# Patient Record
Sex: Male | Born: 1945
Health system: Southern US, Community
[De-identification: ages and names within clinical notes are randomized; demographics above are authoritative.]

## PROBLEM LIST (undated history)

## (undated) DIAGNOSIS — J90 Pleural effusion, not elsewhere classified: Secondary | ICD-10-CM

## (undated) DIAGNOSIS — K219 Gastro-esophageal reflux disease without esophagitis: Secondary | ICD-10-CM

## (undated) DIAGNOSIS — J218 Acute bronchiolitis due to other specified organisms: Secondary | ICD-10-CM

## (undated) DIAGNOSIS — Z8719 Personal history of other diseases of the digestive system: Secondary | ICD-10-CM

## (undated) DIAGNOSIS — E785 Hyperlipidemia, unspecified: Secondary | ICD-10-CM

## (undated) DIAGNOSIS — M1990 Unspecified osteoarthritis, unspecified site: Secondary | ICD-10-CM

## (undated) DIAGNOSIS — I10 Essential (primary) hypertension: Secondary | ICD-10-CM

## (undated) DIAGNOSIS — A1889 Tuberculosis of other sites: Secondary | ICD-10-CM

## (undated) DIAGNOSIS — Z79899 Other long term (current) drug therapy: Secondary | ICD-10-CM

## (undated) HISTORY — DX: Pleural effusion, not elsewhere classified: J90

## (undated) HISTORY — DX: Tuberculosis of other sites: A18.89

## (undated) HISTORY — DX: Hyperlipidemia, unspecified: E78.5

## (undated) HISTORY — PX: JOINT REPLACEMENT: SHX530

## (undated) HISTORY — DX: Acute bronchiolitis due to other specified organisms: J21.8

## (undated) HISTORY — PX: HIATAL HERNIA REPAIR: SHX195

## (undated) HISTORY — PX: CATARACT EXTRACTION W/ INTRAOCULAR LENS  IMPLANT, BILATERAL: SHX1307

## (undated) HISTORY — DX: Other long term (current) drug therapy: Z79.899

## (undated) HISTORY — PX: HERNIA REPAIR: SHX51

## (undated) HISTORY — DX: Gastro-esophageal reflux disease without esophagitis: K21.9

## (undated) SURGERY — Surgical Case
Anesthesia: *Unknown

---

## 2002-02-03 ENCOUNTER — Emergency Department (HOSPITAL_COMMUNITY): Admission: EM | Admit: 2002-02-03 | Discharge: 2002-02-03 | Payer: Self-pay

## 2003-04-09 ENCOUNTER — Encounter: Admission: RE | Admit: 2003-04-09 | Discharge: 2003-04-09 | Payer: Self-pay | Admitting: Orthopedic Surgery

## 2003-04-09 ENCOUNTER — Encounter: Payer: Self-pay | Admitting: Orthopedic Surgery

## 2003-09-30 ENCOUNTER — Encounter: Admission: RE | Admit: 2003-09-30 | Discharge: 2003-09-30 | Payer: Self-pay | Admitting: Emergency Medicine

## 2004-04-28 ENCOUNTER — Ambulatory Visit (HOSPITAL_COMMUNITY): Admission: RE | Admit: 2004-04-28 | Discharge: 2004-04-28 | Payer: Self-pay | Admitting: Gastroenterology

## 2004-05-05 ENCOUNTER — Ambulatory Visit (HOSPITAL_COMMUNITY): Admission: RE | Admit: 2004-05-05 | Discharge: 2004-05-05 | Payer: Self-pay | Admitting: Gastroenterology

## 2004-05-28 ENCOUNTER — Ambulatory Visit (HOSPITAL_COMMUNITY): Admission: RE | Admit: 2004-05-28 | Discharge: 2004-05-28 | Payer: Self-pay | Admitting: Gastroenterology

## 2004-05-28 ENCOUNTER — Encounter (INDEPENDENT_AMBULATORY_CARE_PROVIDER_SITE_OTHER): Payer: Self-pay | Admitting: *Deleted

## 2004-06-10 ENCOUNTER — Encounter: Admission: RE | Admit: 2004-06-10 | Discharge: 2004-06-10 | Payer: Self-pay | Admitting: Gastroenterology

## 2004-08-24 ENCOUNTER — Encounter (INDEPENDENT_AMBULATORY_CARE_PROVIDER_SITE_OTHER): Payer: Self-pay | Admitting: *Deleted

## 2004-08-24 ENCOUNTER — Ambulatory Visit (HOSPITAL_COMMUNITY): Admission: RE | Admit: 2004-08-24 | Discharge: 2004-08-25 | Payer: Self-pay | Admitting: General Surgery

## 2007-03-08 ENCOUNTER — Encounter: Admission: RE | Admit: 2007-03-08 | Discharge: 2007-03-08 | Payer: Self-pay | Admitting: Surgery

## 2007-11-06 ENCOUNTER — Ambulatory Visit (HOSPITAL_COMMUNITY): Admission: RE | Admit: 2007-11-06 | Discharge: 2007-11-07 | Payer: Self-pay | Admitting: General Surgery

## 2010-12-14 NOTE — H&P (Signed)
NAMEDARIL, WARGA              ACCOUNT NO.:  [id number]   MEDICAL RECORD NO.:  [id number]          PATIENT TYPE:  AMB   LOCATION:  DAY                          FACILITY:  [facility]   PHYSICIAN:  Adolph Pollack, M.D.DATE OF BIRTH:  06-02-1946   DATE OF ADMISSION:  11/06/2007  DATE OF DISCHARGE:                              HISTORY & PHYSICAL   REASON FOR ADMISSION:  Elective repair of ventral and umbilical hernias.   HISTORY OF PRESENT ILLNESS:  Mr. Portell is a 65 year old male who had a  primary epigastric hernia repair performed in 2006.  He presented in the  late summer, early fall last year with a bulge inferior to his previous  repair and a periumbilical bulge.  CT scan confirmed an epigastric  ventral hernia with omentum up in it.  He now presents for repair of  both of these hernias.  We discussed the procedure, the risks, and the  aftercare preoperatively.   PAST MEDICAL HISTORY:  1. Hypertension.  2. Ventral hernia.  3. Psoriasis.   PREVIOUS OPERATIONS:  1. Ventral hernia (mesh).  2. Multiple knee surgeries.   ALLERGIES:  None known.   MEDICATIONS:  Norvasc and Enbrel injections.   SOCIAL HISTORY:  Married.  No tobacco or alcohol use.   PHYSICAL EXAMINATION:  GENERAL:  A well-developed, well-nourished male  in no acute distress.  He is pleasant and cooperative.  VITAL SIGNS:  Temperature is 97 degrees, blood pressure 137/92, and  pulse 80.  HEENT:  Eyes:  Extraocular motion is intact.  No icterus.  CHEST:  Breath sounds equal and clear, respirations unlabored.  CARDIOVASCULAR:  Heart demonstrates regular rate, regular rhythm.  No  murmur.  NECK:  No JVD.  ABDOMEN:  Demonstrates an epigastric incision, inferior to this there is  a bulge that is partially reducible.  There is a periumbilical bulge  reducible.  MUSCULOSKELETAL:  SCD hose on.  SKIN:  He has diffuse plaque-like rash present consistent with  psoriasis.   IMPRESSION:  1. Ventral and umbilical  hernias.  2. Psoriasis.   PLAN:  Laparoscopic repair of ventral and umbilical hernias with mesh.      Adolph Pollack, M.D.  Electronically Signed     TJR/MEDQ  D:  11/06/2007  T:  11/06/2007  Job:  [id number]

## 2010-12-14 NOTE — Op Note (Signed)
Alan Roman, Alan Roman              ACCOUNT NO.:  [id number]   MEDICAL RECORD NO.:  [id number]          PATIENT TYPE:  AMB   LOCATION:  DAY                          FACILITY:  [facility]   PHYSICIAN:  Adolph Pollack, M.D.DATE OF BIRTH:  November 05, 1945   DATE OF PROCEDURE:  11/06/2007  DATE OF DISCHARGE:                               OPERATIVE REPORT   PREOPERATIVE DIAGNOSES:  Ventral and umbilical hernias.   POSTOPERATIVE DIAGNOSES:  Ventral and umbilical hernias.   PROCEDURE:  Laparoscopic repair of ventral and umbilical hernias with  mesh.   SURGEON:  Adolph Pollack, M.D.   ASSISTANT:  Angelia Mould. Derrell Lolling, M.D.   ANESTHESIA:  General.   INDICATIONS:  This 65 year old male had a primary epigastric hernia  repaired with mesh in 2006.  Inferior to this repair, he was developed  another hernia and also has an umbilical hernia and presents now for  repair.   TECHNIQUE:  He was seen in the holding area and brought to the operating  room, placed supine on the operating table and general anesthetic was  administered.  A Foley catheter was inserted into the bladder.  The hair  on the abdominal wall was clipped and the area was sterilely prepped and  draped.  Marcaine solution was infiltrated in left upper quadrant  region.  A small incision was made in the left upper quadrant.  Using a  5-mm OptiVu trocar, we gained access to the peritoneal cavity and  pneumoperitoneum was created.  I inspected the area directly below the  trocar laparoscopically and noted no injury to the viscera and no  bleeding.  I could see the omentum up in the hernia in the epigastric  region, as well as evidence of previous hernia repair.  A small  umbilical hernia with preperitoneal fat was noted as well.   An 11-mm trocar was placed in the left lateral abdomen, and after this,  I reduced the omentum from the hernia and then divided the falciform  ligament with the harmonic scalpel.  There was some bleeding  from the  omentum reduced from the hernia, and this was controlled with the  harmonic scalpel.  Two 5-mm trocars were then placed, one in the right  upper quadrant and one in the right midabdomen laterally.   Using a spinal needle, I marked the edges of the ventral abdominal wall  and umbilical hernias and measured 4 cm away from these.  A piece of  mesh needed measuring about 20 cm in length x 12 cm in width.  A piece  of 20 x 15 cm mesh consisting of Protex with a nonadherent barrier on  one side was brought into the field.  Four sutures of #1 Novofil were  placed in four quadrants for anchoring purposes.  The mesh was then  hydrated and placed into the abdominal cavity.   The mesh was then unrolled with the rough side up and the nonadherent  barrier side facing the viscera.  I made four stab incisions around the  four quadrants planned for repair and brought up the sutures across the  fascial  bridge using a suture passer.  These were then tied down,  initially anchoring the mesh to the abdominal wall.  This provided more  than adequate coverage with good overlap of the ventral and umbilical  defects.  I then used a spiral tacker to further anchor the mesh to the  anterior abdominal wall with an outer and an inner rim of tacks.  Upon  inspection, the mesh was well anchored with good coverage and overlap.   I then evacuated some blood and found a few other bleeding points of the  omentum which were controlled with the harmonic scalpel.  I inspected  all four quadrants and saw no evidence of intestinal injury or bleeding.  I then released the pneumoperitoneum and watched the mesh approximate  the omentum and viscera.  All trocars were removed.   All skin incisions were then closed with 4-0 Monocryl subcuticular  stitches followed by Steri-Strips and sterile dressings.  He tolerated  the procedure well without apparent complications and was taken to the  recovery room in satisfactory  condition.      Adolph Pollack, M.D.  Electronically Signed     TJR/MEDQ  D:  11/06/2007  T:  11/06/2007  Job:  [id number]   cc:   Reuben Likes, M.D.  Fax: [phone]

## 2010-12-17 NOTE — Op Note (Signed)
NAMEMARIO, CORONADO              ACCOUNT NO.:  [id number]   MEDICAL RECORD NO.:  [id number]          PATIENT TYPE:  OIB   LOCATION:  NA                           FACILITY:  [facility]   PHYSICIAN:  Adolph Pollack, M.D.DATE OF BIRTH:  12/06/1945   DATE OF PROCEDURE:  08/24/2004  DATE OF DISCHARGE:                                 OPERATIVE REPORT   PREOPERATIVE DIAGNOSIS:  Chronically incarcerated ventral hernia.   POSTOPERATIVE DIAGNOSIS:  Chronically incarcerated ventral hernia.   PROCEDURE:  Ventral hernia repair with mesh.   SURGEON:  Adolph Pollack, M.D.   ASSISTANT:  None.   ANESTHESIA:  General anesthesia.   INDICATIONS FOR PROCEDURE:  Mr. Sherrie George is a 65 year old male who has been  having some epigastric discomfort.  He has had had a CT scan done that  demonstrated a small ventral hernia and palpation of these areas  uncomfortable and it is unable to be completely reduced.  He now presents  for repair of his incarcerated ventral hernia.   DESCRIPTION OF PROCEDURE:  He is seen in the holding area and brought to the  operating room.  Placed supine on the operating table and general anesthetic  was administered.  The abdomen was sterilely prepped and draped.  A  longitudinal epigastric incision was made through the skin and subcutaneous  tissue.  Preperitoneal fat was noted protruding out of a defect and this was  excised.  The defect was noted.  There was also some weak fascia superior to  this so the actual complete defect itself measured about 7 to 8 cm.  I  raised subcutaneous flaps in all directed exposing normal-appearing fascia.  I then primarily closed the defect with interrupted #1 Novafil sutures.  Following this, a piece of polypropylene mesh was placed directly over the  repair and the previous primary closure sutures were threaded up through it  and then the mesh was anchored down directly over the primary repair with  the sutures.  The periphery of the  mesh was then anchored to the fascia with  an overlap of 4 cm with interrupted 0 Novofil sutures.  This provided for  more than adequate coverage of the defect with adequate overlap.   The fascia was then anesthetized with 0.5% plain Marcaine as was the  subcutaneous tissue. After hemostasis was noted to be adequate, the wound  was irrigated.  The subcutaneous tissue was then closed with a running #2  Vicryl suture.  The skin was closed with staples.  A sterile dressing was  applied.   He tolerated the procedure well without any apparent complications and was  taken to the recovery room in satisfactory condition.      TJR/MEDQ  D:  08/24/2004  T:  08/24/2004  Job:  [id number]   cc:   Reuben Likes, M.D.  [address]  [place]  [place] [address]  Fax: [phone]   Anselmo Rod, M.D.  [address]  [address]  [place]  [place] [address]  Fax: [phone]

## 2010-12-17 NOTE — Op Note (Signed)
NAMEKREGG, CIHLAR              ACCOUNT NO.:  [id number]   MEDICAL RECORD NO.:  [id number]          PATIENT TYPE:  AMB   LOCATION:  ENDO                         FACILITY:  [facility]   PHYSICIAN:  Anselmo Rod, M.D.  DATE OF BIRTH:  16-Dec-1945   DATE OF PROCEDURE:  05/28/2004  DATE OF DISCHARGE:  05/28/2004                                 OPERATIVE REPORT   PROCEDURE PERFORMED:  Esophagogastroduodenoscopy with distal esophageal  biopsies.   ENDOSCOPIST:  Anselmo Rod, M.D.   INSTRUMENT USED:  Olympus video panendoscope.   INDICATION FOR PROCEDURE:  A 65 year old Asian male with history of  epigastric pain and reflux and underwent EGD to rule out peptic ulcer  disease, esophagitis, gastritis, etc.   PREPROCEDURE PREPARATION:  Informed consent was procured from the patient  and the patient fasted for 8 hours prior to the procedure.   PREPROCEDURE PHYSICAL:  The patient had stable vital signs, neck supple,  chest clear to auscultation, S1/S2 regular, abdomen soft with normal bowel  sounds.   DESCRIPTION OF PROCEDURE:  The patient was placed in the left lateral  decubitus position and sedated with 75 mg of Demerol and 7.5 mg of Versed in  incremental doses.  Once the patient was adequately sedated and maintained  on low-flow oxygen and continuous cardiac monitoring, the Olympus video  panendoscope was advanced through the mouth, placed over the tongue and into  the esophagus and under direct vision.  The proximal esophagus appeared  normal.  A small nodule was biopsied from the distal esophagus to rule out  Barrett's.  A small hiatal hernia was seen on high retroflexion.  The rest  of the gastric mucosa and proximal small bowel appeared normal.   IMPRESSION:  1.  Small distal esophageal nodule, biopsied, question esophagitis versus      Barrett's esophagus.  2.  Small hiatal hernia.  3.  No ulcers or masses seen.  4.  Normal proximal small bowel and gastric mucosa.   RECOMMENDATIONS:  1.  Avoid nonsteroidals and aspirin for now.  2.  Follow antireflux measures.  3.  PPI of choice.  4.  Proceed with a colonoscopy at this time and further recommendations will      be made after the colonoscopy has been done.       JNM/MEDQ  D:  05/31/2004  T:  05/31/2004  Job:  [id number]   cc:   Adolph Pollack, M.D.  [address]  [place]  [place] [address]  Fax: [phone]   Reuben Likes, M.D.  [address]  [place]  [place] [address]  Fax: [phone]

## 2010-12-17 NOTE — Op Note (Signed)
NAMEBRAYSEN, Alan Roman              ACCOUNT NO.:  [id number]   MEDICAL RECORD NO.:  [id number]          PATIENT TYPE:  AMB   LOCATION:  ENDO                         FACILITY:  [facility]   PHYSICIAN:  Anselmo Rod, M.D.  DATE OF BIRTH:  19-Jan-1946   DATE OF PROCEDURE:  05/28/2004  DATE OF DISCHARGE:  05/28/2004                                 OPERATIVE REPORT   PROCEDURE:  Screening colonoscopy.   ENDOSCOPIST:  Anselmo Rod, M.D.   INSTRUMENT:  Olympus video colonoscope.   INDICATIONS FOR PROCEDURE:  A 65 year old Asian male underwent screening  colonoscopy to rule out colonic polyps, masses, etc.   PRE-PROCEDURE PREPARATION:  Informed consent was procured from the patient.  Patient fasted for 8 hours prior to the procedure and prepped with a bottle  of magnesium citrate and a gallon of NuLytely the night prior to the  procedure.  Pre-procedure physical:  Patient had stable vital signs, neck  supple, chest clear to auscultation, S1/S2 regular, abdomen soft with normal  bowel sounds.   DESCRIPTION OF PROCEDURE:  The patient was placed in the left lateral  decubitus position, sedated with an additional 10 mg of Demerol and 1 mg of  Versed in slow, incremental doses.  Once the patient was adequately sedated  and maintained on low flow oxygen, continuous cardiac monitoring; the  Olympus video colonoscope was advanced from the rectum to the cecum.  The  appendiceal orifice and the ileocecal valve were clearly visualized and  photographed.  No masses, polyps, erosions, diverticulosis or ulcerations  were seen. Small internal hemorrhoids were appreciated on retroflexion in  the rectum.  The patient tolerated the procedure well without immediate  complications.   IMPRESSION:  1.  Normal colonoscopy to the cecum, except for small internal hemorrhoids.  2.  No masses or polyps seen.   RECOMMENDATIONS:  1.  Continue a high fiber diet with liberal fluids intake.  2.  Repeat colonoscopy  in the next 5-10 years unless the patient develops      any abnormal symptoms in the interim.  3.  Outpatient follow up as the need arises in the future.       JNM/MEDQ  D:  05/31/2004  T:  05/31/2004  Job:  [id number]   cc:   Reuben Likes, M.D.  [address]  [place]  [place] [address]  Fax: [phone]   Adolph Pollack, M.D.  [address]  [place]  [place] [address]  Fax: [phone]

## 2011-04-26 LAB — COMPREHENSIVE METABOLIC PANEL
ALT: 36
AST: 38 — ABNORMAL HIGH
Albumin: 3.8
Alkaline Phosphatase: 69
BUN: 10
CO2: 27
Calcium: 8.7
Chloride: 104
Creatinine, Ser: 1.05
GFR calc Af Amer: >60
GFR calc non Af Amer: >60
Glucose, Bld: 89
Potassium: 3.9
Sodium: 137
Total Bilirubin: 1.3 — ABNORMAL HIGH
Total Protein: 7.1

## 2011-04-26 LAB — CBC
HCT: 44.2
Hemoglobin: 15.7
MCHC: 35.5
MCV: 94.1
Platelets: 147 — ABNORMAL LOW
RBC: 4.7
RDW: 12.4
WBC: 5.5

## 2011-04-26 LAB — DIFFERENTIAL
Basophils Absolute: 0
Basophils Relative: 0
Eosinophils Absolute: 0.1
Eosinophils Relative: 1
Lymphocytes Relative: 29
Lymphs Abs: 1.6
Monocytes Absolute: 0.6
Monocytes Relative: 11
Neutro Abs: 3.2
Neutrophils Relative %: 58

## 2012-05-29 ENCOUNTER — Ambulatory Visit
Admission: RE | Admit: 2012-05-29 | Discharge: 2012-05-29 | Disposition: A | Discharge status: Home / Self Care | Payer: Medicare Other | Source: Ambulatory Visit | Attending: Family Medicine | Admitting: Family Medicine

## 2012-05-29 ENCOUNTER — Other Ambulatory Visit: Payer: Self-pay | Admitting: Family Medicine

## 2012-05-29 DIAGNOSIS — R05 Cough: Secondary | ICD-10-CM

## 2012-05-29 DIAGNOSIS — R509 Fever, unspecified: Secondary | ICD-10-CM

## 2012-05-29 DIAGNOSIS — R059 Cough, unspecified: Secondary | ICD-10-CM

## 2013-01-23 ENCOUNTER — Encounter (HOSPITAL_COMMUNITY): Payer: Self-pay | Admitting: *Deleted

## 2013-01-23 ENCOUNTER — Inpatient Hospital Stay (HOSPITAL_COMMUNITY): Payer: Medicare Other

## 2013-01-23 ENCOUNTER — Inpatient Hospital Stay (HOSPITAL_COMMUNITY)
Admission: RE | Admit: 2013-01-23 | Payer: Self-pay | Source: Other Acute Inpatient Hospital | Admitting: Internal Medicine

## 2013-01-23 ENCOUNTER — Inpatient Hospital Stay (HOSPITAL_COMMUNITY)
Admission: AD | Admit: 2013-01-23 | Discharge: 2013-02-02 | DRG: 178 | Disposition: A | Discharge status: Home / Self Care | Payer: Medicare Other | Source: Other Acute Inpatient Hospital | Attending: Internal Medicine | Admitting: Internal Medicine

## 2013-01-23 DIAGNOSIS — R634 Abnormal weight loss: Secondary | ICD-10-CM | POA: Diagnosis present

## 2013-01-23 DIAGNOSIS — R7401 Elevation of levels of liver transaminase levels: Secondary | ICD-10-CM | POA: Diagnosis present

## 2013-01-23 DIAGNOSIS — D899 Disorder involving the immune mechanism, unspecified: Secondary | ICD-10-CM | POA: Diagnosis present

## 2013-01-23 DIAGNOSIS — R7402 Elevation of levels of lactic acid dehydrogenase (LDH): Secondary | ICD-10-CM | POA: Diagnosis present

## 2013-01-23 DIAGNOSIS — T3695XA Adverse effect of unspecified systemic antibiotic, initial encounter: Secondary | ICD-10-CM | POA: Diagnosis present

## 2013-01-23 DIAGNOSIS — A1889 Tuberculosis of other sites: Secondary | ICD-10-CM | POA: Diagnosis present

## 2013-01-23 DIAGNOSIS — R059 Cough, unspecified: Secondary | ICD-10-CM

## 2013-01-23 DIAGNOSIS — J218 Acute bronchiolitis due to other specified organisms: Secondary | ICD-10-CM

## 2013-01-23 DIAGNOSIS — R630 Anorexia: Secondary | ICD-10-CM | POA: Diagnosis present

## 2013-01-23 DIAGNOSIS — M129 Arthropathy, unspecified: Secondary | ICD-10-CM | POA: Diagnosis present

## 2013-01-23 DIAGNOSIS — R509 Fever, unspecified: Secondary | ICD-10-CM

## 2013-01-23 DIAGNOSIS — A158 Other respiratory tuberculosis: Principal | ICD-10-CM | POA: Diagnosis present

## 2013-01-23 DIAGNOSIS — J90 Pleural effusion, not elsewhere classified: Secondary | ICD-10-CM

## 2013-01-23 DIAGNOSIS — R945 Abnormal results of liver function studies: Secondary | ICD-10-CM

## 2013-01-23 DIAGNOSIS — Z66 Do not resuscitate: Secondary | ICD-10-CM | POA: Diagnosis present

## 2013-01-23 DIAGNOSIS — E871 Hypo-osmolality and hyponatremia: Secondary | ICD-10-CM

## 2013-01-23 DIAGNOSIS — L409 Psoriasis, unspecified: Secondary | ICD-10-CM

## 2013-01-23 DIAGNOSIS — D849 Immunodeficiency, unspecified: Secondary | ICD-10-CM | POA: Diagnosis present

## 2013-01-23 DIAGNOSIS — R918 Other nonspecific abnormal finding of lung field: Secondary | ICD-10-CM | POA: Diagnosis present

## 2013-01-23 DIAGNOSIS — L408 Other psoriasis: Secondary | ICD-10-CM | POA: Diagnosis present

## 2013-01-23 DIAGNOSIS — I10 Essential (primary) hypertension: Secondary | ICD-10-CM | POA: Diagnosis present

## 2013-01-23 DIAGNOSIS — R7611 Nonspecific reaction to tuberculin skin test without active tuberculosis: Secondary | ICD-10-CM | POA: Diagnosis present

## 2013-01-23 DIAGNOSIS — R05 Cough: Secondary | ICD-10-CM

## 2013-01-23 DIAGNOSIS — J189 Pneumonia, unspecified organism: Secondary | ICD-10-CM

## 2013-01-23 HISTORY — DX: Unspecified osteoarthritis, unspecified site: M19.90

## 2013-01-23 HISTORY — DX: Essential (primary) hypertension: I10

## 2013-01-23 LAB — CREATININE, SERUM
Creatinine, Ser: 1 mg/dL (ref 0.50–1.35)
GFR calc Af Amer: 87 mL/min — ABNORMAL LOW (ref >90)
GFR calc non Af Amer: 75 mL/min — ABNORMAL LOW (ref >90)

## 2013-01-23 LAB — CBC
HCT: 39.1 % (ref 39.0–52.0)
Hemoglobin: 13.9 g/dL (ref 13.0–17.0)
MCH: 30.6 pg (ref 26.0–34.0)
MCHC: 35.5 g/dL (ref 30.0–36.0)
MCV: 86.1 fL (ref 78.0–100.0)
Platelets: 229 10*3/uL (ref 150–400)
RBC: 4.54 MIL/uL (ref 4.22–5.81)
RDW: 11.9 % (ref 11.5–15.5)
WBC: 4.7 10*3/uL (ref 4.0–10.5)

## 2013-01-23 MED ORDER — SODIUM CHLORIDE 0.9 % IJ SOLN
3.0000 mL | Freq: Two times a day (BID) | INTRAMUSCULAR | Status: DC
  Administered 2013-01-23 – 2013-02-02 (×13): 3 mL via INTRAVENOUS

## 2013-01-23 MED ORDER — ACETAMINOPHEN 325 MG PO TABS
650.0000 mg | ORAL_TABLET | ORAL | Status: DC | PRN
  Administered 2013-01-24 – 2013-01-31 (×14): 650 mg via ORAL
  Filled 2013-01-23 (×14): qty 2

## 2013-01-23 MED ORDER — ENOXAPARIN SODIUM 40 MG/0.4ML ~~LOC~~ SOLN
40.0000 mg | SUBCUTANEOUS | Status: DC
  Administered 2013-01-23 – 2013-02-01 (×10): 40 mg via SUBCUTANEOUS
  Filled 2013-01-23 (×11): qty 0.4

## 2013-01-23 MED ORDER — DEXTROSE 5 % IV SOLN
1.0000 g | INTRAVENOUS | Status: AC
  Administered 2013-01-24 – 2013-01-27 (×4): 1 g via INTRAVENOUS
  Filled 2013-01-23 (×5): qty 10

## 2013-01-23 MED ORDER — DEXTROSE 5 % IV SOLN
500.0000 mg | INTRAVENOUS | Status: AC
  Administered 2013-01-24 – 2013-01-27 (×4): 500 mg via INTRAVENOUS
  Filled 2013-01-23 (×7): qty 500

## 2013-01-23 MED ORDER — HYDROCOD POLST-CHLORPHEN POLST 10-8 MG/5ML PO LQCR
5.0000 mL | Freq: Two times a day (BID) | ORAL | Status: DC | PRN
  Administered 2013-01-23: 5 mL via ORAL
  Filled 2013-01-23: qty 5

## 2013-01-23 NOTE — H&P (Signed)
[facility] History and Physical  Buddie Marston XBJ:[id number] DOB: November 26, 1945 DOA: 01/23/2013  Referring physician: Dr. Sherril Croon PCP: Pcp Not In System  Specialists:   Chief Complaint: Fever of Unknown Origin  HPI: Alan Roman is a 67 y.o. male with a hx of psoriasis on chronic Humera who initially presented to his PCP with fevers of over 101-102 F and a "scratchy" throat. The patient was empirically treated with Augmentin but the fevers persisted. The patient returned to see another PCP where he was given a course of Bactrim, again with no resolution of fevers. The patient was instructed to stop Humera and was subsequently admitted to [facility] for workup for fevers of unknown origin. At [facility], the patient was found to have a normal WBC count, albeit with a slightly elevated monocyte count. ESR was noted to be elevated at just under 80. The patient was subsequently transferred to [facility] for continued workup. Of note, the patient did mention having a 5 lb wt loss in the past 3 weeks that he attributes to decreased appetite.  Review of Systems: mild weight loss, "scratchy" throat, dry cough, fevers, all other review of systems reviewed and are negative  Past Medical History  Diagnosis Date  . Hypertension   . Arthritis    Past Surgical History  Procedure Laterality Date  . Hernia repair     Social History:  reports that he has never smoked. He has never used smokeless tobacco. He reports that he does not drink alcohol or use illicit drugs.   No Known Allergies  History reviewed. No pertinent family history. No significant history (be sure to complete)  Prior to Admission medications   Not on File   Physical Exam: Filed Vitals:   01/23/13 1159  BP: 139/89  Pulse: 98  Temp: 100.4 F (38 C)  TempSrc: Oral  Resp: 20  Height: 5\' 7"  (1.702 m)  Weight: 90.1 kg (198 lb 10.2 oz)  SpO2: 100%     General:  Awake, in NAd  Eyes: PERRL  ENT:  membranes moist, fair dentition  Neck: no palpable lymphadenopathy, trachea midline  Cardiovascular: regular, s1, s2  Respiratory: normal resp effort, no wheezing  Abdomen: soft, nondistended, no palpable inguinal nodes  Skin: good skin turgor, healing psoriatic rash over L shin  Musculoskeletal: perfused distally, no clubbing or cyanosis  Psychiatric: appears normal  Neurologic: cn2-12 grossly intact, strength and sensation intact  Labs on Admission:  Basic Metabolic Panel: No results found for this basename: NA, K, CL, CO2, GLUCOSE, BUN, CREATININE, CALCIUM, MG, PHOS,  in the last 168 hours Liver Function Tests: No results found for this basename: AST, ALT, ALKPHOS, BILITOT, PROT, ALBUMIN,  in the last 168 hours No results found for this basename: LIPASE, AMYLASE,  in the last 168 hours No results found for this basename: AMMONIA,  in the last 168 hours CBC: No results found for this basename: WBC, NEUTROABS, HGB, HCT, MCV, PLT,  in the last 168 hours Cardiac Enzymes: No results found for this basename: CKTOTAL, CKMB, CKMBINDEX, TROPONINI,  in the last 168 hours  BNP (last 3 results) No results found for this basename: PROBNP,  in the last 8760 hours CBG: No results found for this basename: GLUCAP,  in the last 168 hours  Radiological Exams on Admission: No results found.  Assessment/Plan Principal Problem:   Fever, unknown origin Active Problems:   Psoriasis   Immunocompromised state  Fever of Unknown Origin: -Normal white count -Pan cultures pending -PPD  pending -CXR at outside hospital was indeterminate. Will repeat CXR -ID has been consulted -Will hold off on empiric antibiotics as the patient is hemodynamically stable  Psoriasis: -Skin lesions healing -Continue holding Humera -ESR elevated at just under 80  HTN: -BP Stable. -Cont home med  DVT prophylaxis: -Lovenox  Code Status: DNR - addressed in the presence of patient's wife (must indicate  code status--if unknown or must be presumed, indicate so) Family Communication: Patient's wife at bedside (indicate person spoken with, if applicable, with phone number if by telephone) Disposition Plan: Pending (indicate anticipated LOS)  Time spent:  Natika Geyer, Scheryl Marten [facility] Pager [phone]  If 7PM-7AM, please contact night-coverage [email or web address] Password [email or web address] 01/23/2013, 1:18 PM

## 2013-01-23 NOTE — Progress Notes (Signed)
Temp 102 degrees. Blood cultures x2 order released by RN. Pt has been transferred to a negative pressure room. No Tylenol ordered yet. MD paged. Will continue to monitor.

## 2013-01-23 NOTE — Progress Notes (Signed)
Temp down to 100.3 before lab came to floor to draw blood cultures, so blood cultures postponed. Tylenol and IV ABX also temporarily being held until pt has another temp spike greater than 101 degrees. Lab currently on floor to draw other ordered labs. MD notified. Will continue to monitor.

## 2013-01-23 NOTE — Progress Notes (Signed)
RN clarified plan of care with MD. MD instructed to proceed with obtaining blood cultures. RN also instructed to hold empiric IV ABX per note from Dr. Rhona Leavens and on-call MD (due to pt being hemodynamically stable). RN notified lab on floor to proceed with blood cultures. Will continue to monitor.

## 2013-01-23 NOTE — Consult Note (Signed)
Date: 01/23/2013               Patient Name:  Alan Roman MRN: [id number]  DOB: August 11, 1945 Age / Sex: 67 y.o., male   PCP: Pcp Not In System         Requesting Physician: Dr. Jerald Kief, MD    Consulting Reason:  Fever of unknown origin     Chief Complaint: fever and fatigue.  History of Present Illness:  Piercen Covino is a 67 yo male with psoriasis for 10 years. He used Enbrel for the first 5 years and than changed it to [place]. He was tested for TB before starting both medications. He had a positive skin test due to BCG vaccine and negative CXR both times.  He complaints of intermittent fevers that started 3 weeks ago, usually within the range of 100-101F and maximum of 102F, associated with chills relieved with Tylenol and Advil. He also had an intermittent occipital headache associated with the fever that was sharp, 8/10 in intensity, non-radiating, aggravated with fever and relieved with ice pack. He complaints of fatigue and tiredness during this period too. He also had a non-productive cough for last 3 weeks. His primary care physician diagnosed him with bronchitis and started him on Amoxicillin for 7 days. He completed the medication course but the fever did not settle and he went to see another doctor who diagnosed him with UTI and started him on Bactrim.  He was admitted to [facility] on 06/23 due to continuing fever and fatigue. He was started on Ceftriaxone and Acyclovir. His labs at [facility] show  WBC 5.4,  Hb 11.8, platelets 247. His differential showed slightly increased monocytes with 23.4 %. He also had hyponatremia of 128 in consecutive BMPs there. His ECHO did not show any evidence of vegetations but he had thickened MV with mild MR. His monospot test was negative. He was transferred to [facility] on 06/25 with dry cough, fever and fatigue. He also reports loss of appetite and weight loss of 7 pounds in 3 weeks associated with it. He has pain in his knee joint on walking.  He reports two visits to [place] of combined 8 weeks and came back to [place] at the end of march. He did not take any malaria prophylaxis and also denies any exposure to mosquitoes there.  (+) fever, chills, headache, weight loss, loss of appetite, joint pain (- ) bowel changes, ill contacts, chest pain, abdominal pain,   Meds: Current Facility-Administered Medications  Medication Dose Route Frequency Provider Last Rate Last Dose  . chlorpheniramine-HYDROcodone (TUSSIONEX) 10-8 MG/5ML suspension 5 mL  5 mL Oral Q12H PRN Jerald Kief, MD   5 mL at 01/23/13 1618  . enoxaparin (LOVENOX) injection 40 mg  40 mg Subcutaneous Q24H Jerald Kief, MD      . sodium chloride 0.9 % injection 3 mL  3 mL Intravenous Q12H Jerald Kief, MD        Allergies: Allergies as of 01/23/2013  . (No Known Allergies)   Past Medical History  Diagnosis Date  . Hypertension   . Arthritis    Past Surgical History  Procedure Laterality Date  . Hernia repair     History reviewed. No pertinent family history. History   Social History  . Marital Status: Married    Spouse Name: N/A    Number of Children: N/A  . Years of Education: N/A   Occupational History  . Not on file.   Social History  Main Topics  . Smoking status: Never Smoker   . Smokeless tobacco: Never Used  . Alcohol Use: No  . Drug Use: No  . Sexually Active: Not on file   Other Topics Concern  . Not on file   Social History Narrative  . No narrative on file    Physical Exam: Blood pressure 139/89, pulse 98, temperature 100.4 F (38 C), temperature source Oral, resp. rate 20, height 5\' 7"  (1.702 m), weight 90.1 kg (198 lb 10.2 oz), SpO2 100.00%. General: He had a cough during the interiew and examination.  Head: Normocephalic and atraumatic Ear: TM normal bilaterally Nose: No erythema or drainage noted.  Turbinates normal Mouth: no erythema or exudates, MMM, prominent vascular marking on his uvula, Eyes: arcus senilis, PERRL,  EOMI, conjunctivae normal, No scleral icterus.  Neck: Supple, Trachea midline normal ROM, No JVD, mass, thyromegaly, or carotid bruit present, no LAD Cardiovascular: RRR, S1 normal, S2 normal, no MRG, pulses symmetric and intact bilaterally Pulmonary/Chest: CTAB, no wheezes, rales, or rhonchi Abdominal: Soft. Non-tender, non-distended, bowel sounds are normal, no masses, organomegaly, or guarding present.  Neurological: A&O x3, Strength is normal and symmetric bilaterally, cranial nerve II-XII are grossly intact, no focal motor deficit, sensory intact to light touch bilaterally.   Lab results: Basic Metabolic Panel:  Recent Labs  16/10/96 1310  CREATININE 1.00   Liver Function Tests: No results found for this basename: AST, ALT, ALKPHOS, BILITOT, PROT, ALBUMIN,  in the last 72 hours No results found for this basename: LIPASE, AMYLASE,  in the last 72 hours No results found for this basename: AMMONIA,  in the last 72 hours CBC:  Recent Labs  01/23/13 1310  WBC 4.7  HGB 13.9  HCT 39.1  MCV 86.1  PLT 229    Imaging results:  Dg Chest 2 View  01/23/2013   *RADIOLOGY REPORT*  Clinical Data: Fever.  Cough.  CHEST - 2 VIEW  Comparison: None.  Findings: Probable small right subpulmonic pleural effusion. Blunting of right costophrenic angle is present.  Right basilar volume loss/atelectasis.  Patchy airspace density extends to the right costophrenic angle suspicious for pneumonia.  Unchanged elevation of the right hemidiaphragm with right basilar atelectasis.  Small sclerotic focus of the posterior aspect of the T5 vertebra of these probably unchanged compared to 05/29/2012 allowing for differences in projection.  This may be associated with rib trauma or costovertebral osteoarthritis.  Follow-up to ensure radiographic clearing recommended.  Clearing is usually observed at 8 weeks.  IMPRESSION: Right basilar/right lower lobe airspace disease with probable small parapneumonic effusion.   Findings suspicious for pneumonia.   Original Report Authenticated By: Andreas Newport, M.D.    Other results: EKG: normal EKG, normal sinus rhythm, unchanged from previous tracings.  Assessment, Plan, & Recommendations by Problem: Principal Problem:   Fever, unknown origin Active Problems:   Psoriasis   Immunocompromised state  67 yo male with psoriasis and immunocompromised state presented with fever of unknown origin, fatigue, cough and weight loss.  TB: His fever, fatigue, weight loss, immunocompromised state and travel history point towards TB. -We recommend airborne precautions due to suspicion of TB. -We recommend Quantiferon test for TB. Malaria: We recommend a blood smear in order to rule out malaria too.    Signed: Virgina Evener, Med Student 01/23/2013, 5:20 PM

## 2013-01-23 NOTE — Consult Note (Signed)
Please see attending note for recs

## 2013-01-23 NOTE — Consult Note (Signed)
[facility]  Total days of antibiotics 0(previously had 2 days of ctx)               Reason for Consult FUO in immunocompromised host    Referring Physician: chiu  Principal Problem:   Fever, unknown origin Active Problems:   Psoriasis   Immunocompromised state    HPI: Alan Roman is a 67 y.o. male  with psoriasis diagnosed roughly 10 years ago, he previously was on prednisone, MTX, enbrel x 5 yrs and most recently on humara for the last 2 yrs. He was tested for TB before starting both immunologics. He had a positive skin test likely due to BCG vaccine and negative CXR both times.  He reports having intermittent fevers that started 3 weeks ago, usually within the range of 100-101F and maximum of 102F, associated with chills relieved with Tylenol and Advil. He also subscribes to a dry cough, and scratchy throat, in addition to an intermittent occipital headache with coughing. His family notices htat he often gets a dry cough after receiving his humara injection. The last time he did his injection was at the end of May. He complaints of fatigue and tiredness during this period too. Roughly 2 wks ago, His primary care physician diagnosed him with bronchitis and started him on Amoxicillin for 7 days. He completed the medication course but the fever did not resolve and he went to see another doctor who diagnosed him with UTI and started him on Bactrim.  He was admitted to [facility] on 06/23 due to continuing fever and fatigue. He was started on Ceftriaxone and Acyclovir. His labs at [facility] show  WBC 5.4,  Hb 11.8, platelets 247. His differential showed slightly increased monocytes with 23.4 %. He also had hyponatremia of 128 in consecutive BMPs there. His ECHO did not show any evidence of vegetations but he had thickened MV with mild MR. His monospot test was negative.  He was transferred to [facility] on 06/25 with dry cough, fever and fatigue. He also reports loss  of appetite and weight loss of 7 pounds in 3 weeks associated with it. He has pain in his knee joint on walking.   He reports two visits to [place] of combined 8 weeks ( one due to unexpected death of family member) and came back to [place] at the end of march. He did not take any malaria prophylaxis and also denies any exposure to mosquitoes there.he stayed [place] staying with family. He reports using bottled water the whole time. Sleeping in screened in room with airconditioning. He denies any sick contacts, and denies being ill during his trip to [place].   ROS: (+) fever, chills, headache, weight loss, loss of appetite, joint pain All other 12 point ROS are negative No Known Allergies  Meds: Current Facility-Administered Medications  Medication Dose Route Frequency Provider Last Rate Last Dose  . chlorpheniramine-HYDROcodone (TUSSIONEX) 10-8 MG/5ML suspension 5 mL  5 mL Oral Q12H PRN Jerald Kief, MD   5 mL at 01/23/13 1618  . enoxaparin (LOVENOX) injection 40 mg  40 mg Subcutaneous Q24H Jerald Kief, MD   40 mg at 01/23/13 1801  . sodium chloride 0.9 % injection 3 mL  3 mL Intravenous Q12H Jerald Kief, MD   3 mL at 01/23/13 1802    Past Medical History  Diagnosis Date  . Hypertension   . Arthritis    Past Surgical History  Procedure Laterality Date  .  Hernia repair     History reviewed. No pertinent family history. History   Social History  . Marital Status: Married    Spouse Name: N/A    Number of Children: N/A  . Years of Education: N/A   Occupational History  . Not on file.   Social History Main Topics  . Smoking status: Never Smoker   . Smokeless tobacco: Never Used  . Alcohol Use: No  . Drug Use: No  . Sexually Active: Not on file   Other Topics Concern  . Not on file   Social History Narrative  . No narrative on file    Physical Exam: Blood pressure 103/70, pulse 85, temperature 102.1 F (38.9 C), temperature source Oral, resp. rate 18, height  5\' 7"  (1.702 m), weight 198 lb 10.2 oz (90.1 kg), SpO2 95.00%. General: older than stated age male appears fatigue  Head: Normocephalic and atraumatic Ear: TM normal bilaterally Nose: No erythema or drainage noted.  Turbinates normal Mouth: no erythema or exudates, MMM, prominent vascular marking on his uvula, Eyes: arcus senilis, PERRL, EOMI, conjunctivae normal, No scleral icterus.  Neck: Supple, Trachea midline normal ROM, No JVD, mass, thyromegaly, or carotid bruit present, no LAD Cardiovascular: RRR, S1 normal, S2 normal, no MRG, pulses symmetric and intact bilaterally Pulmonary/Chest: CTAB, no wheezes, rales, or rhonchi Abdominal: Soft. Non-tender, non-distended, bowel sounds are normal, no masses, organomegaly, or guarding present.  Neurological: A&O x3, Strength is normal and symmetric bilaterally, cranial nerve II-XII are grossly intact, no focal motor deficit, sensory intact to light touch bilaterally.   Lab results:   Recent Labs  01/23/13 1310  WBC 4.7  HGB 13.9  HCT 39.1  MCV 86.1  PLT 229     Imaging results:  Dg Chest 2 View  01/23/2013   *RADIOLOGY REPORT*  Clinical Data: Fever.  Cough.  CHEST - 2 VIEW  Comparison: None.  Findings: Probable small right subpulmonic pleural effusion. Blunting of right costophrenic angle is present.  Right basilar volume loss/atelectasis.  Patchy airspace density extends to the right costophrenic angle suspicious for pneumonia.  Unchanged elevation of the right hemidiaphragm with right basilar atelectasis.  Small sclerotic focus of the posterior aspect of the T5 vertebra of these probably unchanged compared to 05/29/2012 allowing for differences in projection.  This may be associated with rib trauma or costovertebral osteoarthritis.  Follow-up to ensure radiographic clearing recommended.  Clearing is usually observed at 8 weeks.  IMPRESSION: Right basilar/right lower lobe airspace disease with probable small parapneumonic effusion.  Findings  suspicious for pneumonia.   Original Report Authenticated By: Andreas Newport, M.D.    Other results: EKG: normal EKG, normal sinus rhythm, unchanged from previous tracings.  Assessment, Plan, & Recommendations by Problem: Principal Problem:   Fever, unknown origin Active Problems:   Psoriasis   Immunocompromised state  67yo M with psoriasis on humara who is an immunocompromised host with fever of unknown origin, dry cough, and cxr suggestive of pneumonia  - recommend to continue ceftriaxone and azithromycin - recommend to get sputum cx as well as afb sputum x 3 - recommend to get respiratory viral panel - place on airborne isolation in order to rule out active TB - please get quantiferon - if febrile, please get blood cx x 2, afb culture, ua and urine culture - please get malaria smears  Will provide further recs as more information becomes available. Thank you for consultation   Signed: Judyann Munson, MD 01/23/2013, 8:48 PM

## 2013-01-24 DIAGNOSIS — E871 Hypo-osmolality and hyponatremia: Secondary | ICD-10-CM

## 2013-01-24 DIAGNOSIS — J189 Pneumonia, unspecified organism: Secondary | ICD-10-CM

## 2013-01-24 DIAGNOSIS — R509 Fever, unspecified: Secondary | ICD-10-CM

## 2013-01-24 DIAGNOSIS — D849 Immunodeficiency, unspecified: Secondary | ICD-10-CM

## 2013-01-24 DIAGNOSIS — L408 Other psoriasis: Secondary | ICD-10-CM

## 2013-01-24 LAB — COMPREHENSIVE METABOLIC PANEL
ALT: 28 U/L (ref 0–53)
AST: 39 U/L — ABNORMAL HIGH (ref 0–37)
Albumin: 2.5 g/dL — ABNORMAL LOW (ref 3.5–5.2)
Alkaline Phosphatase: 66 U/L (ref 39–117)
BUN: 9 mg/dL (ref 6–23)
CO2: 23 mEq/L (ref 19–32)
Calcium: 7.9 mg/dL — ABNORMAL LOW (ref 8.4–10.5)
Chloride: 98 mEq/L (ref 96–112)
Creatinine, Ser: 1.12 mg/dL (ref 0.50–1.35)
GFR calc Af Amer: 76 mL/min — ABNORMAL LOW (ref >90)
GFR calc non Af Amer: 66 mL/min — ABNORMAL LOW (ref >90)
Glucose, Bld: 93 mg/dL (ref 70–99)
Potassium: 3.7 mEq/L (ref 3.5–5.1)
Sodium: 130 mEq/L — ABNORMAL LOW (ref 135–145)
Total Bilirubin: 0.5 mg/dL (ref 0.3–1.2)
Total Protein: 6.9 g/dL (ref 6.0–8.3)

## 2013-01-24 LAB — URINE CULTURE
Colony Count: NO GROWTH
Culture: NO GROWTH

## 2013-01-24 LAB — EXPECTORATED SPUTUM ASSESSMENT W GRAM STAIN, RFLX TO RESP C

## 2013-01-24 LAB — CBC
HCT: 31.6 % — ABNORMAL LOW (ref 39.0–52.0)
Hemoglobin: 11.3 g/dL — ABNORMAL LOW (ref 13.0–17.0)
MCH: 31 pg (ref 26.0–34.0)
MCHC: 35.8 g/dL (ref 30.0–36.0)
MCV: 86.8 fL (ref 78.0–100.0)
Platelets: 231 10*3/uL (ref 150–400)
RBC: 3.64 MIL/uL — ABNORMAL LOW (ref 4.22–5.81)
RDW: 11.9 % (ref 11.5–15.5)
WBC: 4.9 10*3/uL (ref 4.0–10.5)

## 2013-01-24 LAB — EXPECTORATED SPUTUM ASSESSMENT W REFEX TO RESP CULTURE

## 2013-01-24 MED ORDER — GUAIFENESIN ER 600 MG PO TB12
600.0000 mg | ORAL_TABLET | Freq: Two times a day (BID) | ORAL | Status: DC
  Administered 2013-01-24 – 2013-01-27 (×8): 600 mg via ORAL
  Filled 2013-01-24 (×10): qty 1

## 2013-01-24 MED ORDER — ENSURE COMPLETE PO LIQD
237.0000 mL | Freq: Two times a day (BID) | ORAL | Status: DC
  Administered 2013-01-24 – 2013-01-29 (×7): 237 mL via ORAL

## 2013-01-24 MED ORDER — ADULT MULTIVITAMIN W/MINERALS CH
1.0000 | ORAL_TABLET | Freq: Every day | ORAL | Status: DC
  Administered 2013-01-24 – 2013-02-02 (×10): 1 via ORAL
  Filled 2013-01-24 (×10): qty 1

## 2013-01-24 MED ORDER — SODIUM CHLORIDE 0.9 % IV SOLN
INTRAVENOUS | Status: DC
  Administered 2013-01-24 – 2013-01-27 (×5): via INTRAVENOUS
  Administered 2013-01-28 – 2013-01-29 (×3): 1000 mL via INTRAVENOUS
  Administered 2013-01-29 – 2013-02-01 (×5): via INTRAVENOUS

## 2013-01-24 NOTE — Progress Notes (Signed)
Notified Infections Disease MD on call that pt's daughter stated that Dr from [facility] called her and advised that ANA test was positive. No new orders given. Will continue to monitor pt closely.  Juliane Lack, RN

## 2013-01-24 NOTE — Progress Notes (Signed)
INITIAL NUTRITION ASSESSMENT  DOCUMENTATION CODES Per approved criteria  -Severe malnutrition in the context of acute illness or injury   INTERVENTION: 1. Clarify diet order to "Vegetarian." 2. Add Ensure Complete po BID, each supplement provides 350 kcal and 13 grams of protein. 3. Add MVI daily. 4. Recommend monitoring magnesium, potassium, and phosphorus daily for at least 3 days, MD to replete as needed, as pt is at moderate risk for refeeding syndrome given ongoing poor oral intake. 5. RD to continue to follow nutrition care plan.  NUTRITION DIAGNOSIS: Inadequate oral intake related to poor appetite as evidenced by dietary recall and ongoing weight loss.   Goal: Intake to meet >90% of estimated nutrition needs.  Monitor:  weight trends, lab trends, I/O's, PO intake, supplement tolerance  Reason for Assessment: MD Consult for Poor PO Intake  67 y.o. male  Admitting Dx: Fever, unknown origin  ASSESSMENT: PMHx significant for psoriasis - pt on chronic Humera. Pt went to PCP with fevers and scratchy throat. Tested for TB - had positive skin test 22 BCG vaccine and negative CXR x 2. Pt prescribed Amoxicillin and then Bactrim, however had no relief. Admitted to [facility] and transferred to [facility] for further work-up.  Pt with 7 lb wt loss x 3 weeks 2/2 poor appetite. Pt with recent travels to [place] in past 3 months - did not take any malaria prophylaxis. Currently being worked up for TB and malaria.  Pt reports usual weight of 160 lb, currently down to 152 lb. This is a weight change of 5% x 3 weeks and is significant for time frame. Family at bedside report that pt is eating approximately 25% of his baseline, and has been eating this poorly x 3 weeks. Vegetarian diet at baseline. Has tried to drink Ensure and family has brought some to hospital for patient  - agreeable to this RD providing. Family confirms that pt has had muscle mass loss and he is very weak.  Pt meets criteria for  severe MALNUTRITION in the context of acute illness as evidenced by 5% wt loss x 3 weeks and intake of <50% of estimated energy needs x at least 5 days.   Temp (24hrs), Avg:100.3 F (37.9 C), Min:98.5 F (36.9 C), Max:102.1 F (38.9 C)   Height: Ht Readings from Last 1 Encounters:  01/23/13 5\' 7"  (1.702 m)    Weight: Wt Readings from Last 1 Encounters:  01/24/13 152 lb 12.5 oz (69.3 kg)    Ideal Body Weight: 148 lb  % Ideal Body Weight: 103%  Wt Readings from Last 10 Encounters:  01/24/13 152 lb 12.5 oz (69.3 kg)    Usual Body Weight: 160 lb  % Usual Body Weight: 95%  BMI:  Body mass index is 23.92 kg/(m^2). WNL  Estimated Nutritional Needs: Kcal: 1650 - 1850 Protein: 70 - 83 grams Fluid: 1.7 - 2 liters  Skin: 6/25  Diet Order: General; Vegetarian  EDUCATION NEEDS: -No education needs identified at this time   Intake/Output Summary (Last 24 hours) at 01/24/13 0805 Last data filed at 01/24/13 0653  Gross per 24 hour  Intake      0 ml  Output    925 ml  Net   -925 ml    Last BM: 6/25  Labs:   Recent Labs Lab 01/23/13 1310 01/24/13 0520  NA  --  130*  K  --  3.7  CL  --  98  CO2  --  23  BUN  --  9  CREATININE 1.00 1.12  CALCIUM  --  7.9*  GLUCOSE  --  93    CBG (last 3)  No results found for this basename: GLUCAP,  in the last 72 hours  Scheduled Meds: . azithromycin  500 mg Intravenous Q24H  . cefTRIAXone (ROCEPHIN)  IV  1 g Intravenous Q24H  . enoxaparin (LOVENOX) injection  40 mg Subcutaneous Q24H  . sodium chloride  3 mL Intravenous Q12H    Continuous Infusions:   Past Medical History  Diagnosis Date  . Hypertension   . Arthritis     Past Surgical History  Procedure Laterality Date  . Hernia repair      Jarold Motto MS, RD, LDN Pager: [phone] After-hours pager: [phone]

## 2013-01-24 NOTE — Progress Notes (Signed)
[facility] PROGRESS NOTE  Alan Roman ZOX:[id number] DOB: January 29, 1946 DOA: 01/23/2013 PCP: Pcp Not In System  Assessment/Plan: FUO -Working diagnosis is pneumonia -Continue ceftriaxone and azithromycin -Followup QuantiFERON-TB -followup AFB sputum -follow up malaria smear -Follow culture data -Last dose of Humira was 12/24/12 Pneumonia -01/23/2013 chest x-ray shows right basilar opacity with small parapneumonic effusion Hyponatremia -Likely volume depletion, poor solute intake -Start normal saline -Check TSH Hypertension -hold amlodipine, blood pressure soft  weight loss, generalized weakness -Physical therapy once respiratory isolation is cleared -Check prealbumin  Family Communication:   Daughter at beside Disposition Plan:   Home when medically stable    Antibiotics:  Ceftriaxone 01/23/2013>>>  Azithromycin 01/23/2013>>>         Procedures/Studies: Dg Chest 2 View  01/23/2013   *RADIOLOGY REPORT*  Clinical Data: Fever.  Cough.  CHEST - 2 VIEW  Comparison: None.  Findings: Probable small right subpulmonic pleural effusion. Blunting of right costophrenic angle is present.  Right basilar volume loss/atelectasis.  Patchy airspace density extends to the right costophrenic angle suspicious for pneumonia.  Unchanged elevation of the right hemidiaphragm with right basilar atelectasis.  Small sclerotic focus of the posterior aspect of the T5 vertebra of these probably unchanged compared to 05/29/2012 allowing for differences in projection.  This may be associated with rib trauma or costovertebral osteoarthritis.  Follow-up to ensure radiographic clearing recommended.  Clearing is usually observed at 8 weeks.  IMPRESSION: Right basilar/right lower lobe airspace disease with probable small parapneumonic effusion.  Findings suspicious for pneumonia.   Original Report Authenticated By: Andreas Newport, M.D.         Subjective: Patient had fevers last night. Denies  any nausea, vomiting, diarrhea, hematochezia, melena, dysuria, hematuria, abdominal pain, new rashes, synovitis. Denies any visual disturbance. Complains of bifrontal headache. No neck or back pain.  Objective: Filed Vitals:   01/23/13 2046 01/23/13 2100 01/24/13 0647 01/24/13 1100  BP: 103/70  106/78 109/64  Pulse: 85  89 95  Temp: 102.1 F (38.9 C) 100.3 F (37.9 C) 98.5 F (36.9 C) 98.8 F (37.1 C)  TempSrc: Oral Oral Oral Oral  Resp: 18  18   Height:      Weight:   69.3 kg (152 lb 12.5 oz)   SpO2: 95%  99% 100%    Intake/Output Summary (Last 24 hours) at 01/24/13 1223 Last data filed at 01/24/13 1022  Gross per 24 hour  Intake    243 ml  Output    775 ml  Net   -532 ml   Weight change:  Exam:   General:  Pt is alert, follows commands appropriately, not in acute distress  HEENT: No icterus, No thrush, No neck mass, [place]/AT, no oropharyngeal exudates or ulcers  Cardiovascular: RRR, S1/S2, no rubs, no gallops  Respiratory: Bibasilar rales without any wheezing. Good air movement.  Abdomen: Soft/+BS, non tender, non distended, no guarding  Extremities: No edema, No lymphangitis, No petechiae, No rashes, no synovitis  Data Reviewed: Basic Metabolic Panel:  Recent Labs Lab 01/23/13 1310 01/24/13 0520  NA  --  130*  K  --  3.7  CL  --  98  CO2  --  23  GLUCOSE  --  93  BUN  --  9  CREATININE 1.00 1.12  CALCIUM  --  7.9*   Liver Function Tests:  Recent Labs Lab 01/24/13 0520  AST 39*  ALT 28  ALKPHOS 66  BILITOT 0.5  PROT 6.9  ALBUMIN 2.5*   No results  found for this basename: LIPASE, AMYLASE,  in the last 168 hours No results found for this basename: AMMONIA,  in the last 168 hours CBC:  Recent Labs Lab 01/23/13 1310 01/24/13 0520  WBC 4.7 4.9  HGB 13.9 11.3*  HCT 39.1 31.6*  MCV 86.1 86.8  PLT 229 231   Cardiac Enzymes: No results found for this basename: CKTOTAL, CKMB, CKMBINDEX, TROPONINI,  in the last 168 hours BNP: No components  found with this basename: POCBNP,  CBG: No results found for this basename: GLUCAP,  in the last 168 hours  Recent Results (from the past 240 hour(s))  CULTURE, EXPECTORATED SPUTUM-ASSESSMENT     Status: None   Collection Time    01/24/13  7:53 AM      Result Value Range Status   Specimen Description SPUTUM   Final   Special Requests Immunocompromised   Final   Sputum evaluation     Final   Value: MICROSCOPIC FINDINGS SUGGEST THAT THIS SPECIMEN IS NOT REPRESENTATIVE OF LOWER RESPIRATORY SECRETIONS. PLEASE RECOLLECT.     Gram Stain Report Called to,Read Back By and Verified With: Pablo Lawrence RN 8:45 01/24/13 ([email or web address])   Report Status 01/24/2013 FINAL   Final     Scheduled Meds: . azithromycin  500 mg Intravenous Q24H  . cefTRIAXone (ROCEPHIN)  IV  1 g Intravenous Q24H  . enoxaparin (LOVENOX) injection  40 mg Subcutaneous Q24H  . feeding supplement  237 mL Oral BID BM  . guaiFENesin  600 mg Oral BID  . multivitamin with minerals  1 tablet Oral Daily  . sodium chloride  3 mL Intravenous Q12H   Continuous Infusions:    Fernande Treiber, DO  [facility] Pager [phone]  If 7PM-7AM, please contact night-coverage [email or web address] Password [facility] 01/24/2013, 12:23 PM   LOS: 1 day

## 2013-01-24 NOTE — Progress Notes (Signed)
UR COMPLETED  

## 2013-01-25 ENCOUNTER — Inpatient Hospital Stay (HOSPITAL_COMMUNITY): Payer: Medicare Other

## 2013-01-25 LAB — CBC WITH DIFFERENTIAL/PLATELET
Basophils Absolute: 0 10*3/uL (ref 0.0–0.1)
Basophils Relative: 1 % (ref 0–1)
Eosinophils Absolute: 0.1 10*3/uL (ref 0.0–0.7)
Eosinophils Relative: 3 % (ref 0–5)
HCT: 34.1 % — ABNORMAL LOW (ref 39.0–52.0)
Hemoglobin: 11.9 g/dL — ABNORMAL LOW (ref 13.0–17.0)
Lymphocytes Relative: 25 % (ref 12–46)
Lymphs Abs: 1 10*3/uL (ref 0.7–4.0)
MCH: 30.7 pg (ref 26.0–34.0)
MCHC: 34.9 g/dL (ref 30.0–36.0)
MCV: 87.9 fL (ref 78.0–100.0)
Monocytes Absolute: 0.9 10*3/uL (ref 0.1–1.0)
Monocytes Relative: 21 % — ABNORMAL HIGH (ref 3–12)
Neutro Abs: 2.1 10*3/uL (ref 1.7–7.7)
Neutrophils Relative %: 50 % (ref 43–77)
Platelets: 234 10*3/uL (ref 150–400)
RBC: 3.88 MIL/uL — ABNORMAL LOW (ref 4.22–5.81)
RDW: 12 % (ref 11.5–15.5)
WBC: 4.2 10*3/uL (ref 4.0–10.5)

## 2013-01-25 LAB — RESPIRATORY VIRUS PANEL
Adenovirus: NOT DETECTED
Influenza A H1: NOT DETECTED
Influenza A H3: NOT DETECTED
Influenza A: NOT DETECTED
Influenza B: NOT DETECTED
Metapneumovirus: NOT DETECTED
Parainfluenza 1: NOT DETECTED
Parainfluenza 2: NOT DETECTED
Parainfluenza 3: NOT DETECTED
Respiratory Syncytial Virus A: NOT DETECTED
Respiratory Syncytial Virus B: NOT DETECTED
Rhinovirus: NOT DETECTED

## 2013-01-25 LAB — BASIC METABOLIC PANEL
BUN: 10 mg/dL (ref 6–23)
CO2: 24 mEq/L (ref 19–32)
Calcium: 8.2 mg/dL — ABNORMAL LOW (ref 8.4–10.5)
Chloride: 101 mEq/L (ref 96–112)
Creatinine, Ser: 0.99 mg/dL (ref 0.50–1.35)
GFR calc Af Amer: >90 mL/min (ref >90)
GFR calc non Af Amer: 82 mL/min — ABNORMAL LOW (ref >90)
Glucose, Bld: 91 mg/dL (ref 70–99)
Potassium: 4.1 mEq/L (ref 3.5–5.1)
Sodium: 134 mEq/L — ABNORMAL LOW (ref 135–145)

## 2013-01-25 LAB — MALARIA SMEAR

## 2013-01-25 LAB — TSH: TSH: 2.384 u[IU]/mL (ref 0.350–4.500)

## 2013-01-25 LAB — PREALBUMIN: Prealbumin: 10 mg/dL — ABNORMAL LOW (ref 17.0–34.0)

## 2013-01-25 MED ORDER — VANCOMYCIN HCL IN DEXTROSE 750-5 MG/150ML-% IV SOLN
750.0000 mg | Freq: Two times a day (BID) | INTRAVENOUS | Status: DC
  Administered 2013-01-25 – 2013-01-26 (×2): 750 mg via INTRAVENOUS
  Filled 2013-01-25 (×3): qty 150

## 2013-01-25 MED ORDER — VANCOMYCIN HCL 10 G IV SOLR
1250.0000 mg | Freq: Once | INTRAVENOUS | Status: AC
  Administered 2013-01-25: 1250 mg via INTRAVENOUS
  Filled 2013-01-25: qty 1250

## 2013-01-25 NOTE — Progress Notes (Signed)
ANTIBIOTIC CONSULT NOTE - INITIAL  Pharmacy Consult for Vancomycin Indication: positive blood cultures  No Known Allergies  Patient Measurements: Height: 5\' 7"  (170.2 cm) Weight: 152 lb 1.6 oz (68.992 kg) (scale c) IBW/kg (Calculated) : 66.1  Vital Signs: Temp: 98.3 F (36.8 C) (06/27 0525) Temp src: Oral (06/27 0525) BP: 107/68 mmHg (06/27 0525) Pulse Rate: 88 (06/27 0525) Intake/Output from previous day: 06/26 0701 - 06/27 0700 In: 1191.8 [P.O.:480; I.V.:411.8; IV Piggyback:300] Out: 2050 [Urine:2050] Intake/Output from this shift: Total I/O In: -  Out: 1650 [Urine:1650]  Labs:  Recent Labs  01/23/13 1310 01/24/13 0520 01/25/13 0428  WBC 4.7 4.9 4.2  HGB 13.9 11.3* 11.9*  PLT 229 231 234  CREATININE 1.00 1.12  --    Estimated Creatinine Clearance: 59 ml/min (by C-G formula based on Cr of 1.12). No results found for this basename: VANCOTROUGH, Leodis Binet, VANCORANDOM, GENTTROUGH, GENTPEAK, GENTRANDOM, TOBRATROUGH, TOBRAPEAK, TOBRARND, AMIKACINPEAK, AMIKACINTROU, AMIKACIN,  in the last 72 hours   Microbiology: Recent Results (from the past 720 hour(s))  URINE CULTURE     Status: None   Collection Time    01/23/13  2:13 PM      Result Value Range Status   Specimen Description URINE, RANDOM   Final   Special Requests Immunocompromised   Final   Culture  Setup Time 01/23/2013 18:38   Final   Colony Count NO GROWTH   Final   Culture NO GROWTH   Final   Report Status 01/24/2013 FINAL   Final  MALARIA SMEAR     Status: None   Collection Time    01/23/13 11:57 PM      Result Value Range Status   Specimen Description Blood   Final   Special Requests Immunocompromised   Final   Malaria Prep     Final   Value: PRESUMPTIVE RESULTS NEGATIVE FOR PLASMODIUM AND OTHER BLOOD PARASITES; BASED ON THIN SMEAR EVALUATION. FINAL REPORT PENDING REVIEW OF THICK SMEAR.   Report Status PENDING   Incomplete  CULTURE, BLOOD (ROUTINE X 2)     Status: None   Collection Time   01/23/13 11:58 PM      Result Value Range Status   Specimen Description BLOOD LEFT ARM   Final   Special Requests BOTTLES DRAWN AEROBIC ONLY 10CC   Final   Culture  Setup Time 01/24/2013 05:06   Final   Culture     Final   Value: GRAM POSITIVE COCCI IN CLUSTERS     Note: Gram Stain Report Called to,Read Back By and Verified With: DALLAS HART AT 04:33 ON 01/25/2013   Report Status PENDING   Incomplete  CULTURE, EXPECTORATED SPUTUM-ASSESSMENT     Status: None   Collection Time    01/24/13  7:53 AM      Result Value Range Status   Specimen Description SPUTUM   Final   Special Requests Immunocompromised   Final   Sputum evaluation     Final   Value: MICROSCOPIC FINDINGS SUGGEST THAT THIS SPECIMEN IS NOT REPRESENTATIVE OF LOWER RESPIRATORY SECRETIONS. PLEASE RECOLLECT.     Gram Stain Report Called to,Read Back By and Verified With: Pablo Lawrence RN 8:45 01/24/13 ([email or web address])   Report Status 01/24/2013 FINAL   Final    Medical History: Past Medical History  Diagnosis Date  . Hypertension   . Arthritis     Medications:  Scheduled:  . azithromycin  500 mg Intravenous Q24H  . cefTRIAXone (ROCEPHIN)  IV  1 g Intravenous Q24H  .  enoxaparin (LOVENOX) injection  40 mg Subcutaneous Q24H  . feeding supplement  237 mL Oral BID BM  . guaiFENesin  600 mg Oral BID  . multivitamin with minerals  1 tablet Oral Daily  . sodium chloride  3 mL Intravenous Q12H   Assessment: 67 yo male with 1/2 blood cultures growing GPC for empiric antibiotics  Goal of Therapy:  Vancomycin trough level 15-20 mcg/ml  Plan:  Vancomycin 1250 mg IV now, then 750 mg IV q12h  Eddie Candle 01/25/2013,6:32 AM

## 2013-01-25 NOTE — Progress Notes (Addendum)
[facility]    Date of Admission:  01/23/2013   Total days of antibiotics 2        Day 2 ceftriaxone        Day 2 azithrom        Day 1 vanco   ID: Alan Roman is a 67 y.o. male with psoriasis on humara who is an immunocompromised host with fever of unknown origin, dry cough, and cxr suggestive of pneumonia  Principal Problem:   Fever, unknown origin Active Problems:   Psoriasis   Immunocompromised state   Hyponatremia    Subjective: Febrile and malaise last night. Poor appetite. Still non productive cough. Patient often notices that he is coughing after drinking fluids  24hr: 1 of 2 blood cx showing gpc in clusters. vanco started  Medications:  . azithromycin  500 mg Intravenous Q24H  . cefTRIAXone (ROCEPHIN)  IV  1 g Intravenous Q24H  . enoxaparin (LOVENOX) injection  40 mg Subcutaneous Q24H  . feeding supplement  237 mL Oral BID BM  . guaiFENesin  600 mg Oral BID  . multivitamin with minerals  1 tablet Oral Daily  . sodium chloride  3 mL Intravenous Q12H  . vancomycin  750 mg Intravenous Q12H    Objective: Vital signs in last 24 hours: Temp:  [98.3 F (36.8 C)-101.9 F (38.8 C)] 99.7 F (37.6 C) (06/27 1002) Pulse Rate:  [88-96] 88 (06/27 0525) Resp:  [16] 16 (06/27 0525) BP: (102-107)/(68-71) 107/68 mmHg (06/27 0525) SpO2:  [97 %-98 %] 97 % (06/27 0525) Weight:  [152 lb 1.6 oz (68.992 kg)] 152 lb 1.6 oz (68.992 kg) (06/27 0525)    General: older than stated age male appears fatigue  Head: Normocephalic and atraumatic  Ear: TM normal bilaterally  Nose: No erythema or drainage noted. Turbinates normal  Mouth: no erythema or exudates, MMM, prominent vascular marking on his uvula,  Eyes: arcus senilis, PERRL, EOMI, conjunctivae normal, No scleral icterus.  Neck: Supple, Trachea midline normal ROM, No JVD, mass, thyromegaly, or carotid bruit present, no LAD  Cardiovascular: RRR, S1 normal, S2 normal, no MRG, pulses symmetric and  intact bilaterally  Pulmonary/Chest: CTAB, no wheezes, rales, rhonchi at bases Abdominal: Soft. Non-tender, non-distended, bowel sounds are normal, no masses, organomegaly, or guarding present.  Neurological: A&O x3, Strength is normal and symmetric bilaterally,   Lab Results  Recent Labs  01/24/13 0520 01/25/13 0428  WBC 4.9 4.2  HGB 11.3* 11.9*  HCT 31.6* 34.1*  NA 130* 134*  K 3.7 4.1  CL 98 101  CO2 23 24  BUN 9 10  CREATININE 1.12 0.99   Liver Panel  Recent Labs  01/24/13 0520  PROT 6.9  ALBUMIN 2.5*  AST 39*  ALT 28  ALKPHOS 66  BILITOT 0.5   Sedimentation Rate No results found for this basename: ESRSEDRATE,  in the last 72 hours C-Reactive Protein No results found for this basename: CRP,  in the last 72 hours  Microbiology: 6/26 afb cx pending, afb smears x 2 are negative 6/26 rvp negative 6/26 resp saliva 6/25 blood cx 1 of 2 gpc in clusters 6/25 urine cx NGTD  Malaria smear negative  Studies/Results: Dg Chest 2 View  01/23/2013   *RADIOLOGY REPORT*  Clinical Data: Fever.  Cough.  CHEST - 2 VIEW  Comparison: None.  Findings: Probable small right subpulmonic pleural effusion. Blunting of right costophrenic angle is present.  Right basilar volume loss/atelectasis.  Patchy airspace density extends to  the right costophrenic angle suspicious for pneumonia.  Unchanged elevation of the right hemidiaphragm with right basilar atelectasis.  Small sclerotic focus of the posterior aspect of the T5 vertebra of these probably unchanged compared to 05/29/2012 allowing for differences in projection.  This may be associated with rib trauma or costovertebral osteoarthritis.  Follow-up to ensure radiographic clearing recommended.  Clearing is usually observed at 8 weeks.  IMPRESSION: Right basilar/right lower lobe airspace disease with probable small parapneumonic effusion.  Findings suspicious for pneumonia.   Original Report Authenticated By: Andreas Newport, M.D.      Assessment/Plan:  67yo Male with psoriasis on humara who is an immunocompromised host with fever of unknown origin, dry cough, and cxr suggestive of pneumonia, concern for atypical pneumonia vs. Aspiration pneumonia  - recommend chest CT now, and would consider getting pulmonary consult to do bronchoscopy if he still remains febrile over the next 48hr.  - if patient is having on going fever, may consider switching regimen to cover aspiration pneumonia coverage such as amp/sub. - would recommend to get swallow eval to assess if their is evidence of chronic aspiration. - will check for CMV viremia  - continue to rule out for active TB, only need 1 more specimen, consideri getting  RT to help induce sputum.quantiferon still pending  gpc bacteremia = continue on vancomycin for now  [facility], [facility] Cell: [phone] Pager: [phone]  01/25/2013, 1:41 PM

## 2013-01-25 NOTE — Progress Notes (Signed)
Blood culture aerobic bottle shows gram positive cocci in clusters. MD paged. Awaiting results from anaerobic bottle. Will continue to monitor.

## 2013-01-25 NOTE — Progress Notes (Signed)
[facility] PROGRESS NOTE  Nil Xiong XBJ:[id number] DOB: 1946-03-08 DOA: 01/23/2013 PCP: Pcp Not In System  Assessment/Plan: FUO  -Working diagnosis is pneumonia suspect atypical -Continue ceftriaxone and azithromycin  -Followup QuantiFERON-TB  -followup AFB sputum--neg x2 -follow up malaria smear--neg -Follow culture data  -Last dose of Humira was 12/24/12  -Await CT chest -Pulmonary consultation depending on results of CT chest and if persistent fever -Stool for C. difficile PCR--3 loose stools today Bacteremia -Likely to be contaminant -Continue empiric vancomycin pending further identification -May need TTE/TEE if staph aureus Pneumonia  -01/23/2013 chest x-ray shows right basilar opacity with small parapneumonic effusion  Hyponatremia  -Likely volume depletion, poor solute intake  -Start normal saline-->improving -Check TSH 2.384 Hypertension  -hold amlodipine, blood pressure soft  weight loss, generalized weakness  -Physical therapy once respiratory isolation is cleared  -Check prealbumin--10.0 -Start nutritional supplement Family Communication: Daughter at beside  Disposition Plan: Home when medically stable   Antibiotics:  Ceftriaxone 01/23/2013>>>  Azithromycin 01/23/2013>>>        Procedures/Studies: Dg Chest 2 View  01/23/2013   *RADIOLOGY REPORT*  Clinical Data: Fever.  Cough.  CHEST - 2 VIEW  Comparison: None.  Findings: Probable small right subpulmonic pleural effusion. Blunting of right costophrenic angle is present.  Right basilar volume loss/atelectasis.  Patchy airspace density extends to the right costophrenic angle suspicious for pneumonia.  Unchanged elevation of the right hemidiaphragm with right basilar atelectasis.  Small sclerotic focus of the posterior aspect of the T5 vertebra of these probably unchanged compared to 05/29/2012 allowing for differences in projection.  This may be associated with rib trauma or costovertebral  osteoarthritis.  Follow-up to ensure radiographic clearing recommended.  Clearing is usually observed at 8 weeks.  IMPRESSION: Right basilar/right lower lobe airspace disease with probable small parapneumonic effusion.  Findings suspicious for pneumonia.   Original Report Authenticated By: Andreas Newport, M.D.         Subjective:  Patient continues to have nonproductive cough. Denies any hemoptysis. Denies any nausea, vomiting, abdominal pain, dysuria, hematuria. Had 3 loose stools today.  Objective: Filed Vitals:   01/25/13 0525 01/25/13 0900 01/25/13 1002 01/25/13 1500  BP: 107/68   121/74  Pulse: 88   90  Temp: 98.3 F (36.8 C) 99.3 F (37.4 C) 99.7 F (37.6 C) 97.7 F (36.5 C)  TempSrc: Oral   Oral  Resp: 16   16  Height:      Weight: 68.992 kg (152 lb 1.6 oz)     SpO2: 97%   97%    Intake/Output Summary (Last 24 hours) at 01/25/13 1825 Last data filed at 01/25/13 1700  Gross per 24 hour  Intake 786.25 ml  Output   2250 ml  Net -1463.75 ml   Weight change: -21.108 kg (-46 lb 8.6 oz) Exam:   General:  Pt is alert, follows commands appropriately, not in acute distress  HEENT: No icterus, No thrush, No neck mass, [place]/AT; no meningismus  Cardiovascular: RRR, S1/S2, no rubs, no gallops  Respiratory: Bibasilar rales, diminished breath sounds right base. No wheezes. Good air movement.  Abdomen: Soft/+BS, non tender, non distended, no guarding  Extremities: No edema, No lymphangitis, No petechiae, No rashes, no synovitis  Data Reviewed: Basic Metabolic Panel:  Recent Labs Lab 01/23/13 1310 01/24/13 0520 01/25/13 0428  NA  --  130* 134*  K  --  3.7 4.1  CL  --  98 101  CO2  --  23 24  GLUCOSE  --  93 91  BUN  --  9 10  CREATININE 1.00 1.12 0.99  CALCIUM  --  7.9* 8.2*   Liver Function Tests:  Recent Labs Lab 01/24/13 0520  AST 39*  ALT 28  ALKPHOS 66  BILITOT 0.5  PROT 6.9  ALBUMIN 2.5*   No results found for this basename: LIPASE, AMYLASE,   in the last 168 hours No results found for this basename: AMMONIA,  in the last 168 hours CBC:  Recent Labs Lab 01/23/13 1310 01/24/13 0520 01/25/13 0428  WBC 4.7 4.9 4.2  NEUTROABS  --   --  2.1  HGB 13.9 11.3* 11.9*  HCT 39.1 31.6* 34.1*  MCV 86.1 86.8 87.9  PLT 229 231 234   Cardiac Enzymes: No results found for this basename: CKTOTAL, CKMB, CKMBINDEX, TROPONINI,  in the last 168 hours BNP: No components found with this basename: POCBNP,  CBG: No results found for this basename: GLUCAP,  in the last 168 hours  Recent Results (from the past 240 hour(s))  URINE CULTURE     Status: None   Collection Time    01/23/13  2:13 PM      Result Value Range Status   Specimen Description URINE, RANDOM   Final   Special Requests Immunocompromised   Final   Culture  Setup Time 01/23/2013 18:38   Final   Colony Count NO GROWTH   Final   Culture NO GROWTH   Final   Report Status 01/24/2013 FINAL   Final  MALARIA SMEAR     Status: None   Collection Time    01/23/13 11:57 PM      Result Value Range Status   Specimen Description Blood   Final   Special Requests Immunocompromised   Final   Malaria Prep     Final   Value: No Plasmodium or Other Blood Parasites Seen on Thick or Thin Smears For persons strongly suspected of having a blood parasite,but have negative smears, it is recommended that blood films be repeated approximately every 12 to 24 hours for 3 consecutive      days.   Report Status 01/25/2013 FINAL   Final  CULTURE, BLOOD (ROUTINE X 2)     Status: None   Collection Time    01/23/13 11:58 PM      Result Value Range Status   Specimen Description BLOOD RIGHT ARM   Final   Special Requests BOTTLES DRAWN AEROBIC ONLY 10CC   Final   Culture  Setup Time 01/24/2013 05:06   Final   Culture     Final   Value:        BLOOD CULTURE RECEIVED NO GROWTH TO DATE CULTURE WILL BE HELD FOR 5 DAYS BEFORE ISSUING A FINAL NEGATIVE REPORT   Report Status PENDING   Incomplete  CULTURE, BLOOD  (ROUTINE X 2)     Status: None   Collection Time    01/23/13 11:58 PM      Result Value Range Status   Specimen Description BLOOD LEFT ARM   Final   Special Requests BOTTLES DRAWN AEROBIC ONLY 10CC   Final   Culture  Setup Time 01/24/2013 05:06   Final   Culture     Final   Value: GRAM POSITIVE COCCI IN CLUSTERS     Note: Gram Stain Report Called to,Read Back By and Verified With: DALLAS HART AT 04:33 ON 01/25/2013   Report Status PENDING   Incomplete  CULTURE, EXPECTORATED SPUTUM-ASSESSMENT     Status:  None   Collection Time    01/24/13  7:53 AM      Result Value Range Status   Specimen Description SPUTUM   Final   Special Requests Immunocompromised   Final   Sputum evaluation     Final   Value: MICROSCOPIC FINDINGS SUGGEST THAT THIS SPECIMEN IS NOT REPRESENTATIVE OF LOWER RESPIRATORY SECRETIONS. PLEASE RECOLLECT.     Gram Stain Report Called to,Read Back By and Verified With: Pablo Lawrence RN 8:45 01/24/13 ([email or web address])   Report Status 01/24/2013 FINAL   Final  AFB CULTURE WITH SMEAR     Status: None   Collection Time    01/24/13  7:53 AM      Result Value Range Status   Specimen Description SPUTUM   Final   Special Requests ADD [id number]   Final   ACID FAST SMEAR NO ACID FAST BACILLI SEEN   Final   Culture     Final   Value: CULTURE WILL BE EXAMINED FOR 6 WEEKS BEFORE ISSUING A FINAL REPORT   Report Status PENDING   Incomplete  RESPIRATORY VIRUS PANEL     Status: None   Collection Time    01/24/13  8:18 AM      Result Value Range Status   Source - RVPAN     Corrected   Value: CORRECTED ON 06/26 AT 0848: PREVIOUSLY REPORTED AS NASAL SWAB   Comment: CORRECTED ON 06/27 AT 1808: PREVIOUSLY REPORTED AS NASOPHARYNGEAL, CORRECTED ON 06/26 AT 0848: PREVIOUSLY REPORTED AS NASAL SWAB   Respiratory Syncytial Virus A NOT DETECTED   Final   Respiratory Syncytial Virus B NOT DETECTED   Final   Influenza A NOT DETECTED   Final   Influenza B NOT DETECTED   Final   Parainfluenza 1 NOT DETECTED    Final   Parainfluenza 2 NOT DETECTED   Final   Parainfluenza 3 NOT DETECTED   Final   Metapneumovirus NOT DETECTED   Final   Rhinovirus NOT DETECTED   Final   Adenovirus NOT DETECTED   Final   Influenza A H1 NOT DETECTED   Final   Influenza A H3 NOT DETECTED   Final   Comment: (NOTE)           Normal Reference Range for each Analyte: NOT DETECTED     Testing performed using the [employer] xTAG Respiratory Viral Panel test     kit.     This test was developed and its performance characteristics determined     by [employer]. It has not been cleared or approved by the [place]     Food and Drug Administration. This test is used for clinical purposes.     It should not be regarded as investigational or for research. This     laboratory is certified under the Clinical Laboratory Improvement     Amendments of 1988 (CLIA) as qualified to perform high complexity     clinical laboratory testing.  AFB CULTURE WITH SMEAR     Status: None   Collection Time    01/24/13  6:18 PM      Result Value Range Status   Specimen Description SPUTUM   Final   Special Requests Immunocompromised   Final   ACID FAST SMEAR NO ACID FAST BACILLI SEEN   Final   Culture     Final   Value: CULTURE WILL BE EXAMINED FOR 6 WEEKS BEFORE ISSUING A FINAL REPORT   Report Status PENDING   Incomplete  Scheduled Meds: . azithromycin  500 mg Intravenous Q24H  . cefTRIAXone (ROCEPHIN)  IV  1 g Intravenous Q24H  . enoxaparin (LOVENOX) injection  40 mg Subcutaneous Q24H  . feeding supplement  237 mL Oral BID BM  . guaiFENesin  600 mg Oral BID  . multivitamin with minerals  1 tablet Oral Daily  . sodium chloride  3 mL Intravenous Q12H  . vancomycin  750 mg Intravenous Q12H   Continuous Infusions: . sodium chloride 75 mL/hr at 01/25/13 0438     Tearra Ouk, DO  [facility] Pager [phone]  If 7PM-7AM, please contact night-coverage [email or web address] Password [facility] 01/25/2013, 6:25 PM   LOS: 2 days

## 2013-01-26 ENCOUNTER — Encounter (HOSPITAL_COMMUNITY): Payer: Self-pay | Admitting: Radiology

## 2013-01-26 ENCOUNTER — Inpatient Hospital Stay (HOSPITAL_COMMUNITY): Payer: Medicare Other

## 2013-01-26 DIAGNOSIS — J218 Acute bronchiolitis due to other specified organisms: Secondary | ICD-10-CM

## 2013-01-26 DIAGNOSIS — J90 Pleural effusion, not elsewhere classified: Secondary | ICD-10-CM | POA: Diagnosis present

## 2013-01-26 DIAGNOSIS — R197 Diarrhea, unspecified: Secondary | ICD-10-CM

## 2013-01-26 HISTORY — DX: Pleural effusion, not elsewhere classified: J90

## 2013-01-26 HISTORY — DX: Acute bronchiolitis due to other specified organisms: J21.8

## 2013-01-26 LAB — COMPREHENSIVE METABOLIC PANEL
ALT: 53 U/L (ref 0–53)
AST: 60 U/L — ABNORMAL HIGH (ref 0–37)
Albumin: 2.6 g/dL — ABNORMAL LOW (ref 3.5–5.2)
Alkaline Phosphatase: 76 U/L (ref 39–117)
BUN: 8 mg/dL (ref 6–23)
CO2: 24 mEq/L (ref 19–32)
Calcium: 8 mg/dL — ABNORMAL LOW (ref 8.4–10.5)
Chloride: 99 mEq/L (ref 96–112)
Creatinine, Ser: 0.93 mg/dL (ref 0.50–1.35)
GFR calc Af Amer: >90 mL/min (ref >90)
GFR calc non Af Amer: 84 mL/min — ABNORMAL LOW (ref >90)
Glucose, Bld: 91 mg/dL (ref 70–99)
Potassium: 3.7 mEq/L (ref 3.5–5.1)
Sodium: 133 mEq/L — ABNORMAL LOW (ref 135–145)
Total Bilirubin: 0.4 mg/dL (ref 0.3–1.2)
Total Protein: 7.3 g/dL (ref 6.0–8.3)

## 2013-01-26 LAB — CULTURE, BLOOD (ROUTINE X 2)

## 2013-01-26 LAB — CLOSTRIDIUM DIFFICILE BY PCR: Toxigenic C. Difficile by PCR: NEGATIVE

## 2013-01-26 MED ORDER — IOHEXOL 300 MG/ML  SOLN
25.0000 mL | INTRAMUSCULAR | Status: AC
Start: 2013-01-26 — End: 2013-01-26
  Administered 2013-01-26 (×2): 25 mL via ORAL

## 2013-01-26 MED ORDER — IOHEXOL 300 MG/ML  SOLN
100.0000 mL | Freq: Once | INTRAMUSCULAR | Status: AC | PRN
  Administered 2013-01-26: 100 mL via INTRAVENOUS

## 2013-01-26 MED ORDER — CYCLOBENZAPRINE HCL 5 MG PO TABS
7.5000 mg | ORAL_TABLET | Freq: Three times a day (TID) | ORAL | Status: DC | PRN
  Administered 2013-01-26: 7.5 mg via ORAL
  Filled 2013-01-26: qty 1.5

## 2013-01-26 NOTE — Progress Notes (Signed)
[facility]  Date of Admission:  01/23/2013  Antibiotics: Vancomycin day 2 Rocephin Azithromycin day 3  Subjective: Continues to have cough, he reports a fever last night but no fever documented  Objective: Temp:  [97.7 F (36.5 C)-98.1 F (36.7 C)] 98.1 F (36.7 C) (06/28 0500) Pulse Rate:  [90] 90 (06/28 0500) Resp:  [16-17] 17 (06/28 0500) BP: (121)/(74-75) 121/75 mmHg (06/28 0500) SpO2:  [96 %-97 %] 96 % (06/28 0500) Weight:  [153 lb 11.2 oz (69.718 kg)] 153 lb 11.2 oz (69.718 kg) (06/28 0700)  General: Awake, alert, fatigued appearing Skin: no rashes Lungs: diminished right base, no wheezes Cor: RRR without m/r/g Abdomen: soft, nt, nd   Lab Results Lab Results  Component Value Date   WBC 4.2 01/25/2013   HGB 11.9* 01/25/2013   HCT 34.1* 01/25/2013   MCV 87.9 01/25/2013   PLT 234 01/25/2013    Lab Results  Component Value Date   CREATININE 0.93 01/26/2013   BUN 8 01/26/2013   NA 133* 01/26/2013   K 3.7 01/26/2013   CL 99 01/26/2013   CO2 24 01/26/2013    Lab Results  Component Value Date   ALT 53 01/26/2013   AST 60* 01/26/2013   ALKPHOS 76 01/26/2013   BILITOT 0.4 01/26/2013      Microbiology: Recent Results (from the past 240 hour(s))  URINE CULTURE     Status: None   Collection Time    01/23/13  2:13 PM      Result Value Range Status   Specimen Description URINE, RANDOM   Final   Special Requests Immunocompromised   Final   Culture  Setup Time 01/23/2013 18:38   Final   Colony Count NO GROWTH   Final   Culture NO GROWTH   Final   Report Status 01/24/2013 FINAL   Final  MALARIA SMEAR     Status: None   Collection Time    01/23/13 11:57 PM      Result Value Range Status   Specimen Description Blood   Final   Special Requests Immunocompromised   Final   Malaria Prep     Final   Value: No Plasmodium or Other Blood Parasites Seen on Thick or Thin Smears For persons strongly suspected of having a blood parasite,but have  negative smears, it is recommended that blood films be repeated approximately every 12 to 24 hours for 3 consecutive      days.   Report Status 01/25/2013 FINAL   Final  CULTURE, BLOOD (ROUTINE X 2)     Status: None   Collection Time    01/23/13 11:58 PM      Result Value Range Status   Specimen Description BLOOD RIGHT ARM   Final   Special Requests BOTTLES DRAWN AEROBIC ONLY 10CC   Final   Culture  Setup Time 01/24/2013 05:06   Final   Culture     Final   Value:        BLOOD CULTURE RECEIVED NO GROWTH TO DATE CULTURE WILL BE HELD FOR 5 DAYS BEFORE ISSUING A FINAL NEGATIVE REPORT   Report Status PENDING   Incomplete  CULTURE, BLOOD (ROUTINE X 2)     Status: None   Collection Time    01/23/13 11:58 PM      Result Value Range Status   Specimen Description BLOOD LEFT ARM   Final   Special Requests BOTTLES DRAWN AEROBIC ONLY 10CC   Final   Culture  Setup Time 01/24/2013 05:06   Final   Culture     Final   Value: STAPHYLOCOCCUS SPECIES (COAGULASE NEGATIVE)     Note: THE SIGNIFICANCE OF ISOLATING THIS ORGANISM FROM A SINGLE SET OF BLOOD CULTURES WHEN MULTIPLE SETS ARE DRAWN IS UNCERTAIN. PLEASE NOTIFY THE MICROBIOLOGY DEPARTMENT WITHIN ONE WEEK IF SPECIATION AND SENSITIVITIES ARE REQUIRED.     Note: Gram Stain Report Called to,Read Back By and Verified With: DALLAS HART AT 04:33 ON 01/25/2013   Report Status 01/26/2013 FINAL   Final  CULTURE, EXPECTORATED SPUTUM-ASSESSMENT     Status: None   Collection Time    01/24/13  7:53 AM      Result Value Range Status   Specimen Description SPUTUM   Final   Special Requests Immunocompromised   Final   Sputum evaluation     Final   Value: MICROSCOPIC FINDINGS SUGGEST THAT THIS SPECIMEN IS NOT REPRESENTATIVE OF LOWER RESPIRATORY SECRETIONS. PLEASE RECOLLECT.     Gram Stain Report Called to,Read Back By and Verified With: Pablo Lawrence RN 8:45 01/24/13 ([email or web address])   Report Status 01/24/2013 FINAL   Final  AFB CULTURE WITH SMEAR     Status: None   Collection  Time    01/24/13  7:53 AM      Result Value Range Status   Specimen Description SPUTUM   Final   Special Requests ADD [id number]   Final   ACID FAST SMEAR NO ACID FAST BACILLI SEEN   Final   Culture     Final   Value: CULTURE WILL BE EXAMINED FOR 6 WEEKS BEFORE ISSUING A FINAL REPORT   Report Status PENDING   Incomplete  RESPIRATORY VIRUS PANEL     Status: None   Collection Time    01/24/13  8:18 AM      Result Value Range Status   Source - RVPAN     Corrected   Value: CORRECTED ON 06/26 AT 0848: PREVIOUSLY REPORTED AS NASAL SWAB   Comment: CORRECTED ON 06/27 AT 1808: PREVIOUSLY REPORTED AS NASOPHARYNGEAL, CORRECTED ON 06/26 AT 0848: PREVIOUSLY REPORTED AS NASAL SWAB   Respiratory Syncytial Virus A NOT DETECTED   Final   Respiratory Syncytial Virus B NOT DETECTED   Final   Influenza A NOT DETECTED   Final   Influenza B NOT DETECTED   Final   Parainfluenza 1 NOT DETECTED   Final   Parainfluenza 2 NOT DETECTED   Final   Parainfluenza 3 NOT DETECTED   Final   Metapneumovirus NOT DETECTED   Final   Rhinovirus NOT DETECTED   Final   Adenovirus NOT DETECTED   Final   Influenza A H1 NOT DETECTED   Final   Influenza A H3 NOT DETECTED   Final   Comment: (NOTE)           Normal Reference Range for each Analyte: NOT DETECTED     Testing performed using the Luminex xTAG Respiratory Viral Panel test     kit.     This test was developed and its performance characteristics determined     by [employer]. It has not been cleared or approved by the [place]     Food and Drug Administration. This test is used for clinical purposes.     It should not be regarded as investigational or for research. This     laboratory is certified under the Clinical Laboratory Improvement     Amendments of 1988 (CLIA) as qualified to perform high  complexity     clinical laboratory testing.  AFB CULTURE WITH SMEAR     Status: None   Collection Time    01/24/13  6:18 PM      Result Value Range Status    Specimen Description SPUTUM   Final   Special Requests Immunocompromised   Final   ACID FAST SMEAR NO ACID FAST BACILLI SEEN   Final   Culture     Final   Value: CULTURE WILL BE EXAMINED FOR 6 WEEKS BEFORE ISSUING A FINAL REPORT   Report Status PENDING   Incomplete  CLOSTRIDIUM DIFFICILE BY PCR     Status: None   Collection Time    01/25/13  9:25 PM      Result Value Range Status   C difficile by pcr NEGATIVE  NEGATIVE Final    Studies/Results: Ct Chest Wo Contrast  01/25/2013   *RADIOLOGY REPORT*  Clinical Data: Cough.  Shortness of breath.  Chest pain.  Probable pneumonia on recent imaging.  Immunocompromised patient.  CT CHEST WITHOUT CONTRAST  Technique:  Multidetector CT imaging of the chest was performed following the standard protocol without IV contrast.  Comparison: No prior chest CT.  Two-view chest x-ray 01/23/2013.  Findings: Respiratory motion blurred many of the images.  Large right pleural effusion with associated passive atelectasis in the right lower lobe.  No confluent airspace consolidation elsewhere. Tree in bud opacities in the inferior right upper lobe.  2 mm subpleural nodule in the inferior left upper lobe (series 3, image 24) and polygonal subpleural nodule in the superior segment left lower lobe adjacent to the fissure (image 20).  No significant pulmonary parenchymal nodules.  No left pleural effusion.  Central airways patent without significant bronchial wall thickening.  Heart size normal.  Prominent epicardial fat and prominent paracardiac fat pads.  Mild to moderate LAD coronary atherosclerosis.  No pericardial effusion.  Mild atherosclerosis involving the thoracic and upper abdominal aorta without aneurysm.  Numerous mediastinal lymph nodes which are borderline in size, the largest a station 4R node at the carina measuring approximately 1.5 x 1.2 cm.  No nodal masses.  Thyroid gland normal in appearance.  Very small hiatal hernia.  Inflammatory changes in the upper  abdomen in the midline and right upper quadrant, incompletely imaged.  Bone window images demonstrate diffuse thoracic spondylosis.  IMPRESSION:  1.  Large right pleural effusion and associated passive atelectasis in the right lower lobe. 2.  Tree in bud opacities in the inferior right upper lobe which are consistent with infection/alveolitis. 3.  Inflammatory changes involving the upper abdomen in the midline and right upper quadrant.  These findings are incompletely imaged. Dedicated CT abdomen and pelvis may be helpful for further characterization. 4.  Scattered borderline mediastinal lymph nodes, likely reactive. 5.  Very small hiatal hernia.   Original Report Authenticated By: Hulan Saas, M.D.    Assessment/Plan: 1) Pneumonia - it is more consistent with an atypical infection.  He continues to have significant dry cough which I suspect is due to the large pleural effusion noted on CT.  Pulmonary also consulted.  Respiratory virus panel negative.  AFB smear negative x 2, with third pending. -He likely would benefit from thoracentesis, diagnostic and therapeutic;will await pulmonary evaluation in this regard -if thoracentesis done, please send for bacterial culture and gram stain, AFB smear and cultures, fungal culture, PCP -when/if third AFB smear negative, can d/c isolation -no documented fever for > 24 hours -continue current antibiotics  2) Diarrhea -  c diff negative, can try symptomatically  3)  GPC - now 1/2 CoNS, c/w contaminate.  Will d/c vancomycin  Staci Righter, MD [facility] [email or web address] [phone] pager   [phone] cell 01/26/2013, 1:06 PM

## 2013-01-26 NOTE — Progress Notes (Signed)
Pt. Alert and oriented this am. Rested well during the night. Temp. 98.1 this am. No distress or discomfort noted. No c/o pain. Family at bedside. VSS. Airborne/contact precautions remain in place. Call light within reach. RN will continue to monitor pt. For changes in condition. Aryka Coonradt, Cheryll Dessert

## 2013-01-26 NOTE — Progress Notes (Addendum)
SLP Cancellation Note  Patient Details Name: Alan Roman MRN: [id number] DOB: 03/16/1946   Cancelled treatment:       Reason Eval/Treat Not Completed: Other (comment) (pt on airborne precautions, radiology to inform SLP if able to complete).    SLP spoke to Dr Tat re: evaluating SLP yesterday indicating concerns for possible GI/esophageal issues and requested clarification if desire MBS or esophagram.  Dr Tat advised to proceed with MBS.    Will do MBS today if radiology able to accommodate pt.  RN [facility] informed.     Donavan Burnet, MS [facility] SLP [phone]   SLP spoke to Joe in Radiology, MBS or esophagram can not be completed on pt's who are on airborne precautions as radiology does not have negative pressure rooms.  6213 01/26/13 TK  SLP to follow up next week to see if pt is off airborne precautions and appropriate for MBS.  Thanks.

## 2013-01-26 NOTE — Progress Notes (Signed)
Patient has temp of 102.5.  Dr. Arbutus Leas notified.  NNO.  PO Tylenol given.  Will continue to monitor.

## 2013-01-26 NOTE — Progress Notes (Signed)
Specimen for C-Diff collected and taken to lab.

## 2013-01-26 NOTE — Progress Notes (Signed)
[facility] PROGRESS NOTE  Alan Roman WUJ:[id number] DOB: Mar 11, 1946 DOA: 01/23/2013 PCP: Pcp Not In System  Assessment/Plan: FUO  -Working diagnosis is pneumonia suspect atypical  -Continue ceftriaxone and azithromycin  -Followup QuantiFERON-TB  -followup AFB sputum--neg x2  -follow up malaria smear--neg  -repiratory viral panel neg -Last dose of Humira was 12/24/12  -CT chest--"tree in bud" opacity RUL, L-subpleural nodules, scattered mediastinal LN, right pleural effusion -Pulmonary consultation ordered  -Stool for C. difficile PCR--neg -CT abdomen and pelvis--"inflammatory changes" RUQ and midline Bacteremia  -Likely to be contaminant  -d/c vancomycin -May need TTE/TEE if staph aureus  Pneumonia  -01/23/2013 chest x-ray shows right basilar opacity with parapneumonic effusion  Transaminasemia -likely due to ceftriaxone and azithromycin -continue to monitor Hyponatremia  -Likely volume depletion, poor solute intake  -Start normal saline-->improving  -Check TSH 2.384  Hypertension  -hold amlodipine, blood pressure soft  weight loss, generalized weakness  -Physical therapy once respiratory isolation is cleared  -Check prealbumin--10.0  -Start nutritional supplement   Family Communication: Daughter at beside  Disposition Plan: Home when medically stable  Antibiotics:  Ceftriaxone 01/23/2013>>>  Azithromycin 01/23/2013>>>          Procedures/Studies: Dg Chest 2 View  01/23/2013   *RADIOLOGY REPORT*  Clinical Data: Fever.  Cough.  CHEST - 2 VIEW  Comparison: None.  Findings: Probable small right subpulmonic pleural effusion. Blunting of right costophrenic angle is present.  Right basilar volume loss/atelectasis.  Patchy airspace density extends to the right costophrenic angle suspicious for pneumonia.  Unchanged elevation of the right hemidiaphragm with right basilar atelectasis.  Small sclerotic focus of the posterior aspect of the T5 vertebra of these  probably unchanged compared to 05/29/2012 allowing for differences in projection.  This may be associated with rib trauma or costovertebral osteoarthritis.  Follow-up to ensure radiographic clearing recommended.  Clearing is usually observed at 8 weeks.  IMPRESSION: Right basilar/right lower lobe airspace disease with probable small parapneumonic effusion.  Findings suspicious for pneumonia.   Original Report Authenticated By: Andreas Newport, M.D.   Ct Chest Wo Contrast  01/25/2013   *RADIOLOGY REPORT*  Clinical Data: Cough.  Shortness of breath.  Chest pain.  Probable pneumonia on recent imaging.  Immunocompromised patient.  CT CHEST WITHOUT CONTRAST  Technique:  Multidetector CT imaging of the chest was performed following the standard protocol without IV contrast.  Comparison: No prior chest CT.  Two-view chest x-ray 01/23/2013.  Findings: Respiratory motion blurred many of the images.  Large right pleural effusion with associated passive atelectasis in the right lower lobe.  No confluent airspace consolidation elsewhere. Tree in bud opacities in the inferior right upper lobe.  2 mm subpleural nodule in the inferior left upper lobe (series 3, image 24) and polygonal subpleural nodule in the superior segment left lower lobe adjacent to the fissure (image 20).  No significant pulmonary parenchymal nodules.  No left pleural effusion.  Central airways patent without significant bronchial wall thickening.  Heart size normal.  Prominent epicardial fat and prominent paracardiac fat pads.  Mild to moderate LAD coronary atherosclerosis.  No pericardial effusion.  Mild atherosclerosis involving the thoracic and upper abdominal aorta without aneurysm.  Numerous mediastinal lymph nodes which are borderline in size, the largest a station 4R node at the carina measuring approximately 1.5 x 1.2 cm.  No nodal masses.  Thyroid gland normal in appearance.  Very small hiatal hernia.  Inflammatory changes in the upper abdomen in  the midline and right upper quadrant, incompletely imaged.  Bone window images demonstrate diffuse thoracic spondylosis.  IMPRESSION:  1.  Large right pleural effusion and associated passive atelectasis in the right lower lobe. 2.  Tree in bud opacities in the inferior right upper lobe which are consistent with infection/alveolitis. 3.  Inflammatory changes involving the upper abdomen in the midline and right upper quadrant.  These findings are incompletely imaged. Dedicated CT abdomen and pelvis may be helpful for further characterization. 4.  Scattered borderline mediastinal lymph nodes, likely reactive. 5.  Very small hiatal hernia.   Original Report Authenticated By: Hulan Saas, M.D.         Subjective: Patient complains of right posterior/occipital headache with right neck pain. Denies any dizziness, visual disturbance, chest pain, shortness breath, nausea, vomiting, diarrhea, dysuria, hematuria. He has some intermittent right upper quadrant discomfort.  Objective: Filed Vitals:   01/25/13 1002 01/25/13 1500 01/26/13 0500 01/26/13 0700  BP:  121/74 121/75   Pulse:  90 90   Temp: 99.7 F (37.6 C) 97.7 F (36.5 C) 98.1 F (36.7 C)   TempSrc:  Oral Oral   Resp:  16 17   Height:      Weight:    69.718 kg (153 lb 11.2 oz)  SpO2:  97% 96%     Intake/Output Summary (Last 24 hours) at 01/26/13 1107 Last data filed at 01/26/13 0654  Gross per 24 hour  Intake    240 ml  Output   1400 ml  Net  -1160 ml   Weight change: 0.726 kg (1 lb 9.6 oz) Exam:   General:  Pt is alert, follows commands appropriately, not in acute distress  HEENT: No icterus, No thrush, No neck mass, [place]/AT; scalp without any lesions or erythema. Right cervical paraspinal muscular hypertonicity  Cardiovascular: RRR, S1/S2, no rubs, no gallops  Respiratory: Bibasilar crackles. Diminished breath sounds right base. No wheezes. Good air movement.  Abdomen: Soft/+BS, non tender, non distended, no  guarding  Extremities: No edema, No lymphangitis, No petechiae, No rashes, no synovitis  Data Reviewed: Basic Metabolic Panel:  Recent Labs Lab 01/23/13 1310 01/24/13 0520 01/25/13 0428 01/26/13 0435  NA  --  130* 134* 133*  K  --  3.7 4.1 3.7  CL  --  98 101 99  CO2  --  23 24 24   GLUCOSE  --  93 91 91  BUN  --  9 10 8   CREATININE 1.00 1.12 0.99 0.93  CALCIUM  --  7.9* 8.2* 8.0*   Liver Function Tests:  Recent Labs Lab 01/24/13 0520 01/26/13 0435  AST 39* 60*  ALT 28 53  ALKPHOS 66 76  BILITOT 0.5 0.4  PROT 6.9 7.3  ALBUMIN 2.5* 2.6*   No results found for this basename: LIPASE, AMYLASE,  in the last 168 hours No results found for this basename: AMMONIA,  in the last 168 hours CBC:  Recent Labs Lab 01/23/13 1310 01/24/13 0520 01/25/13 0428  WBC 4.7 4.9 4.2  NEUTROABS  --   --  2.1  HGB 13.9 11.3* 11.9*  HCT 39.1 31.6* 34.1*  MCV 86.1 86.8 87.9  PLT 229 231 234   Cardiac Enzymes: No results found for this basename: CKTOTAL, CKMB, CKMBINDEX, TROPONINI,  in the last 168 hours BNP: No components found with this basename: POCBNP,  CBG: No results found for this basename: GLUCAP,  in the last 168 hours  Recent Results (from the past 240 hour(s))  URINE CULTURE     Status: None   Collection Time  01/23/13  2:13 PM      Result Value Range Status   Specimen Description URINE, RANDOM   Final   Special Requests Immunocompromised   Final   Culture  Setup Time 01/23/2013 18:38   Final   Colony Count NO GROWTH   Final   Culture NO GROWTH   Final   Report Status 01/24/2013 FINAL   Final  MALARIA SMEAR     Status: None   Collection Time    01/23/13 11:57 PM      Result Value Range Status   Specimen Description Blood   Final   Special Requests Immunocompromised   Final   Malaria Prep     Final   Value: No Plasmodium or Other Blood Parasites Seen on Thick or Thin Smears For persons strongly suspected of having a blood parasite,but have negative smears, it  is recommended that blood films be repeated approximately every 12 to 24 hours for 3 consecutive      days.   Report Status 01/25/2013 FINAL   Final  CULTURE, BLOOD (ROUTINE X 2)     Status: None   Collection Time    01/23/13 11:58 PM      Result Value Range Status   Specimen Description BLOOD RIGHT ARM   Final   Special Requests BOTTLES DRAWN AEROBIC ONLY 10CC   Final   Culture  Setup Time 01/24/2013 05:06   Final   Culture     Final   Value:        BLOOD CULTURE RECEIVED NO GROWTH TO DATE CULTURE WILL BE HELD FOR 5 DAYS BEFORE ISSUING A FINAL NEGATIVE REPORT   Report Status PENDING   Incomplete  CULTURE, BLOOD (ROUTINE X 2)     Status: None   Collection Time    01/23/13 11:58 PM      Result Value Range Status   Specimen Description BLOOD LEFT ARM   Final   Special Requests BOTTLES DRAWN AEROBIC ONLY 10CC   Final   Culture  Setup Time 01/24/2013 05:06   Final   Culture     Final   Value: STAPHYLOCOCCUS SPECIES (COAGULASE NEGATIVE)     Note: THE SIGNIFICANCE OF ISOLATING THIS ORGANISM FROM A SINGLE SET OF BLOOD CULTURES WHEN MULTIPLE SETS ARE DRAWN IS UNCERTAIN. PLEASE NOTIFY THE MICROBIOLOGY DEPARTMENT WITHIN ONE WEEK IF SPECIATION AND SENSITIVITIES ARE REQUIRED.     Note: Gram Stain Report Called to,Read Back By and Verified With: DALLAS HART AT 04:33 ON 01/25/2013   Report Status 01/26/2013 FINAL   Final  CULTURE, EXPECTORATED SPUTUM-ASSESSMENT     Status: None   Collection Time    01/24/13  7:53 AM      Result Value Range Status   Specimen Description SPUTUM   Final   Special Requests Immunocompromised   Final   Sputum evaluation     Final   Value: MICROSCOPIC FINDINGS SUGGEST THAT THIS SPECIMEN IS NOT REPRESENTATIVE OF LOWER RESPIRATORY SECRETIONS. PLEASE RECOLLECT.     Gram Stain Report Called to,Read Back By and Verified With: Pablo Lawrence RN 8:45 01/24/13 ([email or web address])   Report Status 01/24/2013 FINAL   Final  AFB CULTURE WITH SMEAR     Status: None   Collection Time    01/24/13   7:53 AM      Result Value Range Status   Specimen Description SPUTUM   Final   Special Requests ADD [id number]   Final   ACID FAST SMEAR NO ACID FAST BACILLI SEEN  Final   Culture     Final   Value: CULTURE WILL BE EXAMINED FOR 6 WEEKS BEFORE ISSUING A FINAL REPORT   Report Status PENDING   Incomplete  RESPIRATORY VIRUS PANEL     Status: None   Collection Time    01/24/13  8:18 AM      Result Value Range Status   Source - RVPAN     Corrected   Value: CORRECTED ON 06/26 AT 0848: PREVIOUSLY REPORTED AS NASAL SWAB   Comment: CORRECTED ON 06/27 AT 1808: PREVIOUSLY REPORTED AS NASOPHARYNGEAL, CORRECTED ON 06/26 AT 0848: PREVIOUSLY REPORTED AS NASAL SWAB   Respiratory Syncytial Virus A NOT DETECTED   Final   Respiratory Syncytial Virus B NOT DETECTED   Final   Influenza A NOT DETECTED   Final   Influenza B NOT DETECTED   Final   Parainfluenza 1 NOT DETECTED   Final   Parainfluenza 2 NOT DETECTED   Final   Parainfluenza 3 NOT DETECTED   Final   Metapneumovirus NOT DETECTED   Final   Rhinovirus NOT DETECTED   Final   Adenovirus NOT DETECTED   Final   Influenza A H1 NOT DETECTED   Final   Influenza A H3 NOT DETECTED   Final   Comment: (NOTE)           Normal Reference Range for each Analyte: NOT DETECTED     Testing performed using the Luminex xTAG Respiratory Viral Panel test     kit.     This test was developed and its performance characteristics determined     by [employer]. It has not been cleared or approved by the [place]     Food and Drug Administration. This test is used for clinical purposes.     It should not be regarded as investigational or for research. This     laboratory is certified under the Clinical Laboratory Improvement     Amendments of 1988 (CLIA) as qualified to perform high complexity     clinical laboratory testing.  AFB CULTURE WITH SMEAR     Status: None   Collection Time    01/24/13  6:18 PM      Result Value Range Status   Specimen Description  SPUTUM   Final   Special Requests Immunocompromised   Final   ACID FAST SMEAR NO ACID FAST BACILLI SEEN   Final   Culture     Final   Value: CULTURE WILL BE EXAMINED FOR 6 WEEKS BEFORE ISSUING A FINAL REPORT   Report Status PENDING   Incomplete  CLOSTRIDIUM DIFFICILE BY PCR     Status: None   Collection Time    01/25/13  9:25 PM      Result Value Range Status   C difficile by pcr NEGATIVE  NEGATIVE Final     Scheduled Meds: . azithromycin  500 mg Intravenous Q24H  . cefTRIAXone (ROCEPHIN)  IV  1 g Intravenous Q24H  . enoxaparin (LOVENOX) injection  40 mg Subcutaneous Q24H  . feeding supplement  237 mL Oral BID BM  . guaiFENesin  600 mg Oral BID  . multivitamin with minerals  1 tablet Oral Daily  . sodium chloride  3 mL Intravenous Q12H  . vancomycin  750 mg Intravenous Q12H   Continuous Infusions: . sodium chloride 75 mL/hr at 01/25/13 2118     Tanvi Gatling, DO  [facility] Pager [phone]  If 7PM-7AM, please contact night-coverage [email or web address] Password [facility] 01/26/2013, 11:07  AM   LOS: 3 days

## 2013-01-26 NOTE — Evaluation (Addendum)
Note created on behalf of Clinton Sawyer SLP who completed BSE on 01/25/13 at approx 11 am  Clinical/Bedside Swallow Evaluation Patient Details  Name: Alan Roman MRN: [id number] Date of Birth: 1946-04-02  Today's Date: 01/26/2013 completed on 01/15/13    Past Medical History:  Past Medical History  Diagnosis Date  . Hypertension   . Arthritis    Past Surgical History:  Past Surgical History  Procedure Laterality Date  . Hernia repair     HPI:  67 y.o. male admitted with persistent fever s/p recurrent ABX. CXR revealed right basilar density suspicious of pna. Pt. had been on Humira for psoriasis. PMH + HTN, arthritis, and reports occassioanl reflux for  which he takes OTC antacids. According to pt, he has had hiatal hernia repair. Pt and daughter report excessive belching after PO. Pt denies h/o CVA, PD, neuro changes.   Assessment / Plan / Recommendation Clinical Impression  No overt s/s of aspiration. Swallow is timely with good laryngeal elevation. Voice clear after swallow. Excessive belching after PO trials. Pt reports this has been occurring regularly. He reports occassional reflux. Recommend GI consult for esophageal function and LPR, ? secondary aspiration of reflux.             Follow Up Recommendations  None           SLP Swallow Goals     Swallow Study Prior Functional Status       General HPI: 67 y.o. male admitted with persistent fever s/p recurrent ABX. CXR revealed right basilar density suspicious of pna. Pt. had been on Humira for psoriasis. PMH + HTN, arthritis, and reports occassioanl reflux for  which he takes OTC antacids. According to pt, he has had hiatal hernia repair. Pt and daughter report excessive belching after PO. Pt denies h/o CVA, PD, neuro changes.    Oral/Motor/Sensory Function Overall Oral Motor/Sensory Function: Appears within functional limits for tasks assessed Labial ROM: Within Functional Limits Labial Strength: Within Functional  Limits Lingual ROM: Within Functional Limits Lingual Strength: Within Functional Limits Facial ROM: Within Functional Limits                      Donavan Burnet, MS [facility] SLP [phone]

## 2013-01-26 NOTE — Consult Note (Signed)
PULMONARY  / CRITICAL CARE MEDICINE  Name: Alan Roman MRN: [id number] DOB: Jan 25, 1946    ADMISSION DATE:  01/23/2013 CONSULTATION DATE: 01/26/13  REFERRING MD :  Dr Tat PRIMARY SERVICE:  TRH  CHIEF COMPLAINT:  Cough  BRIEF PATIENT DESCRIPTION: 67 yo M never smoker with 3 weeks hx of dry cough, anorexia, weight loss. Noted large R pleural effusion, Thoracic adenopathy, RUL tree-in bud bronchiolitis changes. ID following. PCCM consulted for help w/ assessment.  SIGNIFICANT EVENTS / STUDIES:    LINES / TUBES:   CULTURES:   ANTIBIOTICS: zith 6/25>> roceph 6/25>>  HISTORY OF PRESENT ILLNESS:  67 yo M never smoker with 3 weeks hx of dry cough, anorexia, weight loss. Noted large R pleural effusion, Thoracic adenopathy, RUL tree-in bud bronchiolitis changes. ID following. PCCM consulted for help w/ assessment. Family in room. Discussed w/u so far. He denies any similar previous illness. Notes cough with any food or drink, but not any sense of reflux/ penetration. 10 lb weight loss since not eating . Images reviewed. Note changes of bronchiolitis in R lung, with large R pleural effusion. Some mediastinal adenopathy, may be reactive. No fever, cultures neg. Chest CT suggested inflammatory changes in abd, so Abd CT pending.  PAST MEDICAL HISTORY :  Past Medical History  Diagnosis Date  . Hypertension   . Arthritis    Past Surgical History  Procedure Laterality Date  . Hernia repair     Prior to Admission medications   Medication Sig Start Date End Date Taking? Authorizing Provider  amLODipine (NORVASC) 5 MG tablet Take 5 mg by mouth daily.   Yes Historical Provider, MD   No Known Allergies  FAMILY HISTORY:  History reviewed. No pertinent family history. SOCIAL HISTORY:  reports that he has never smoked. He has never used smokeless tobacco. He reports that he does not drink alcohol or use illicit drugs. Significant for recent travel to [place].  REVIEW OF SYSTEMS:    Constitutional: Negative for fever, chills, +weight loss, malaise/fatigue No- diaphoresis.  HENT: Negative for hearing loss, ear pain, nosebleeds, congestion, sore throat, neck pain, tinnitus and ear discharge.   Eyes: Negative for blurred vision, double vision, photophobia, pain, discharge and redness.  Respiratory: +cough, No-hemoptysis, No-sputum production, +shortness of breath, No-wheezing and stridor.   Cardiovascular: Negative for chest pain, palpitations, orthopnea, claudication, leg swelling and PND.  Gastrointestinal: Negative for heartburn, nausea, vomiting, abdominal pain, diarrhea, constipation, blood in stool and melena.  Genitourinary: Negative for dysuria, urgency, frequency, hematuria and flank pain.  Musculoskeletal: Negative for myalgias, back pain, joint pain and falls.  Skin: Negative for itching and rash.  Neurological: Negative for dizziness, tingling, tremors, sensory change, speech change, focal weakness, seizures, loss of consciousness, weakness and headaches.  Endo/Heme/Allergies: Negative for environmental allergies and polydipsia. Does not bruise/bleed easily.  SUBJECTIVE: Family in room hwlped with discussion. He couldn't complete sentences due to cough.  VITAL SIGNS: Temp:  [98.1 F (36.7 C)-98.4 F (36.9 C)] 98.4 F (36.9 C) (06/28 1500) Pulse Rate:  [90-96] 96 (06/28 1500) Resp:  [17-18] 18 (06/28 1500) BP: (121-128)/(75-81) 128/81 mmHg (06/28 1500) SpO2:  [96 %] 96 % (06/28 1500) Weight:  [69.718 kg (153 lb 11.2 oz)] 69.718 kg (153 lb 11.2 oz) (06/28 0700)  PHYSICAL EXAMINATION: General:  Slender M, alert and oriented Neuro:  Grossly intact, non-lateralizing HEENT:  Mucosa clear, gross vision and hearing intact Neck:  No stridor Cardiovascular:  RR, no M/G audible Lungs:  Dry cough, Dull to mid R, no  rub, few crackles on left Abdomen:  Scaphoid, BS + Musculoskeletal:  Slender, not atrophic Skin: No rash   Recent Labs Lab 01/24/13 0520  01/25/13 0428 01/26/13 0435  NA 130* 134* 133*  K 3.7 4.1 3.7  CL 98 101 99  CO2 23 24 24   BUN 9 10 8   CREATININE 1.12 0.99 0.93  GLUCOSE 93 91 91    Recent Labs Lab 01/23/13 1310 01/24/13 0520 01/25/13 0428  HGB 13.9 11.3* 11.9*  HCT 39.1 31.6* 34.1*  WBC 4.7 4.9 4.2  PLT 229 231 234   Ct Chest Wo Contrast  01/25/2013   *RADIOLOGY REPORT*  Clinical Data: Cough.  Shortness of breath.  Chest pain.  Probable pneumonia on recent imaging.  Immunocompromised patient.  CT CHEST WITHOUT CONTRAST  Technique:  Multidetector CT imaging of the chest was performed following the standard protocol without IV contrast.  Comparison: No prior chest CT.  Two-view chest x-ray 01/23/2013.  Findings: Respiratory motion blurred many of the images.  Large right pleural effusion with associated passive atelectasis in the right lower lobe.  No confluent airspace consolidation elsewhere. Tree in bud opacities in the inferior right upper lobe.  2 mm subpleural nodule in the inferior left upper lobe (series 3, image 24) and polygonal subpleural nodule in the superior segment left lower lobe adjacent to the fissure (image 20).  No significant pulmonary parenchymal nodules.  No left pleural effusion.  Central airways patent without significant bronchial wall thickening.  Heart size normal.  Prominent epicardial fat and prominent paracardiac fat pads.  Mild to moderate LAD coronary atherosclerosis.  No pericardial effusion.  Mild atherosclerosis involving the thoracic and upper abdominal aorta without aneurysm.  Numerous mediastinal lymph nodes which are borderline in size, the largest a station 4R node at the carina measuring approximately 1.5 x 1.2 cm.  No nodal masses.  Thyroid gland normal in appearance.  Very small hiatal hernia.  Inflammatory changes in the upper abdomen in the midline and right upper quadrant, incompletely imaged.  Bone window images demonstrate diffuse thoracic spondylosis.  IMPRESSION:  1.  Large  right pleural effusion and associated passive atelectasis in the right lower lobe. 2.  Tree in bud opacities in the inferior right upper lobe which are consistent with infection/alveolitis. 3.  Inflammatory changes involving the upper abdomen in the midline and right upper quadrant.  These findings are incompletely imaged. Dedicated CT abdomen and pelvis may be helpful for further characterization. 4.  Scattered borderline mediastinal lymph nodes, likely reactive. 5.  Very small hiatal hernia.   Original Report Authenticated By: Hulan Saas, M.D.    ASSESSMENT / PLAN: Significant R pleural effusion- recommend IR referral for ultrasound thoracentesis, for the labs indicated by Inf Dis note today.  Tree-in-bud changes and nodules suggest a slow infection, commonly atypical AFB in this country. Note travel to [place], so TB w/u is appropriate. Atypical AFB does not usually cause large effusions, so these are likely separate problems, unless TB or CA. Effusion might be related to the inflammatory changes suspected in abdomen, but again this wouldn't cause the bronchiolitis. If necessary after other issues are addressed, bronchoscopy for airway culture might be option.  Terisa Starr, MD Pulmonary and Critical Care Medicine [facility] [phone]  or Pager: [phone]  01/26/2013, 4:15 PM

## 2013-01-26 NOTE — Progress Notes (Signed)
RN informed by NT that pt. Had elevated temp. RN administered PRN Tylenol for fever. Will continue to monitor pt. For changes in condition. Ladawn Boullion, Cheryll Dessert, RN

## 2013-01-27 DIAGNOSIS — J90 Pleural effusion, not elsewhere classified: Secondary | ICD-10-CM

## 2013-01-27 LAB — CBC WITH DIFFERENTIAL/PLATELET
Basophils Absolute: 0 10*3/uL (ref 0.0–0.1)
Basophils Relative: 1 % (ref 0–1)
Eosinophils Absolute: 0.1 10*3/uL (ref 0.0–0.7)
Eosinophils Relative: 2 % (ref 0–5)
HCT: 30.7 % — ABNORMAL LOW (ref 39.0–52.0)
Hemoglobin: 10.8 g/dL — ABNORMAL LOW (ref 13.0–17.0)
Lymphocytes Relative: 16 % (ref 12–46)
Lymphs Abs: 0.6 10*3/uL — ABNORMAL LOW (ref 0.7–4.0)
MCH: 30.4 pg (ref 26.0–34.0)
MCHC: 35.2 g/dL (ref 30.0–36.0)
MCV: 86.5 fL (ref 78.0–100.0)
Monocytes Absolute: 0.8 10*3/uL (ref 0.1–1.0)
Monocytes Relative: 19 % — ABNORMAL HIGH (ref 3–12)
Neutro Abs: 2.5 10*3/uL (ref 1.7–7.7)
Neutrophils Relative %: 62 % (ref 43–77)
Platelets: 228 10*3/uL (ref 150–400)
RBC: 3.55 MIL/uL — ABNORMAL LOW (ref 4.22–5.81)
RDW: 12 % (ref 11.5–15.5)
WBC: 4 10*3/uL (ref 4.0–10.5)

## 2013-01-27 LAB — COMPREHENSIVE METABOLIC PANEL
ALT: 60 U/L — ABNORMAL HIGH (ref 0–53)
AST: 66 U/L — ABNORMAL HIGH (ref 0–37)
Albumin: 2.4 g/dL — ABNORMAL LOW (ref 3.5–5.2)
Alkaline Phosphatase: 73 U/L (ref 39–117)
BUN: 6 mg/dL (ref 6–23)
CO2: 23 mEq/L (ref 19–32)
Calcium: 7.5 mg/dL — ABNORMAL LOW (ref 8.4–10.5)
Chloride: 101 mEq/L (ref 96–112)
Creatinine, Ser: 0.85 mg/dL (ref 0.50–1.35)
GFR calc Af Amer: >90 mL/min (ref >90)
GFR calc non Af Amer: 88 mL/min — ABNORMAL LOW (ref >90)
Glucose, Bld: 99 mg/dL (ref 70–99)
Potassium: 3.6 mEq/L (ref 3.5–5.1)
Sodium: 132 mEq/L — ABNORMAL LOW (ref 135–145)
Total Bilirubin: 0.3 mg/dL (ref 0.3–1.2)
Total Protein: 6.7 g/dL (ref 6.0–8.3)

## 2013-01-27 NOTE — Progress Notes (Signed)
[facility] PROGRESS NOTE  Alan Roman AVW:[id number] DOB: 1945-09-25 DOA: 01/23/2013 PCP: Pcp Not In System  Assessment/Plan: FUO  -Working diagnosis is pneumonia suspect atypical  -Continue ceftriaxone and azithromycin  -Followup QuantiFERON-TB  -followup AFB sputum--neg x2  -follow up malaria smear--neg  -repiratory viral panel neg  -Last dose of Humira was 12/24/12  -CT chest--"tree in bud" opacity RUL, L-subpleural nodules, scattered mediastinal LN, right pleural effusion  -appreciate Pulmonary consultation -Stool for C. difficile PCR--neg  -CT abdomen and pelvis--no gallstones or gallbladder wall thickening. Interstitial thickening in the right upper quadrant intraperitoneal space, small ascites -Plan for thoracocentesis- -ordered pleural fluid for silver stain, AFB culture/strain, routine Gram stain/culture, cytology, cell count, LDH, protein, glucose -HIV serology Bacteremia  -Likely to be contaminant  -d/c vancomycin  Pneumonia  -01/23/2013 chest x-ray shows right basilar opacity with parapneumonic effusion  Transaminasemia  -likely due to ceftriaxone and azithromycin  -continue to monitor  Hyponatremia  -Likely volume depletion, poor solute intake  -Start normal saline-->improving  -Check TSH 2.384  -serum and urine osm Hypertension  -hold amlodipine, blood pressure soft  weight loss, generalized weakness  -Physical therapy  -Check prealbumin--10.0  -Start nutritional supplement  Family Communication: Daughter at beside  Disposition Plan: Home when medically stable  Antibiotics:  Ceftriaxone 01/23/2013>>>  Azithromycin 01/23/2013>>>         Procedures/Studies: Dg Chest 2 View  01/23/2013   *RADIOLOGY REPORT*  Clinical Data: Fever.  Cough.  CHEST - 2 VIEW  Comparison: None.  Findings: Probable small right subpulmonic pleural effusion. Blunting of right costophrenic angle is present.  Right basilar volume loss/atelectasis.  Patchy airspace density  extends to the right costophrenic angle suspicious for pneumonia.  Unchanged elevation of the right hemidiaphragm with right basilar atelectasis.  Small sclerotic focus of the posterior aspect of the T5 vertebra of these probably unchanged compared to 05/29/2012 allowing for differences in projection.  This may be associated with rib trauma or costovertebral osteoarthritis.  Follow-up to ensure radiographic clearing recommended.  Clearing is usually observed at 8 weeks.  IMPRESSION: Right basilar/right lower lobe airspace disease with probable small parapneumonic effusion.  Findings suspicious for pneumonia.   Original Report Authenticated By: Andreas Newport, M.D.   Ct Chest Wo Contrast  01/25/2013   *RADIOLOGY REPORT*  Clinical Data: Cough.  Shortness of breath.  Chest pain.  Probable pneumonia on recent imaging.  Immunocompromised patient.  CT CHEST WITHOUT CONTRAST  Technique:  Multidetector CT imaging of the chest was performed following the standard protocol without IV contrast.  Comparison: No prior chest CT.  Two-view chest x-ray 01/23/2013.  Findings: Respiratory motion blurred many of the images.  Large right pleural effusion with associated passive atelectasis in the right lower lobe.  No confluent airspace consolidation elsewhere. Tree in bud opacities in the inferior right upper lobe.  2 mm subpleural nodule in the inferior left upper lobe (series 3, image 24) and polygonal subpleural nodule in the superior segment left lower lobe adjacent to the fissure (image 20).  No significant pulmonary parenchymal nodules.  No left pleural effusion.  Central airways patent without significant bronchial wall thickening.  Heart size normal.  Prominent epicardial fat and prominent paracardiac fat pads.  Mild to moderate LAD coronary atherosclerosis.  No pericardial effusion.  Mild atherosclerosis involving the thoracic and upper abdominal aorta without aneurysm.  Numerous mediastinal lymph nodes which are  borderline in size, the largest a station 4R node at the carina measuring approximately 1.5 x 1.2 cm.  No  nodal masses.  Thyroid gland normal in appearance.  Very small hiatal hernia.  Inflammatory changes in the upper abdomen in the midline and right upper quadrant, incompletely imaged.  Bone window images demonstrate diffuse thoracic spondylosis.  IMPRESSION:  1.  Large right pleural effusion and associated passive atelectasis in the right lower lobe. 2.  Tree in bud opacities in the inferior right upper lobe which are consistent with infection/alveolitis. 3.  Inflammatory changes involving the upper abdomen in the midline and right upper quadrant.  These findings are incompletely imaged. Dedicated CT abdomen and pelvis may be helpful for further characterization. 4.  Scattered borderline mediastinal lymph nodes, likely reactive. 5.  Very small hiatal hernia.   Original Report Authenticated By: Hulan Saas, M.D.   Ct Abdomen Pelvis W Contrast  01/26/2013   *RADIOLOGY REPORT*  Clinical Data: Right upper quadrant pain and midline inflammatory process.  Hypertension and arthritis.  Fever of unknown origin. Immunocompromised state.  CT ABDOMEN AND PELVIS WITH CONTRAST  Technique:  Multidetector CT imaging of the abdomen and pelvis was performed following the standard protocol during bolus administration of intravenous contrast.  Contrast: OMNIPAQUE IOHEXOL 300 MG/ML  SOLN  Comparison: 03/08/2007 abdominal pelvic CT.  Chest CT of 1 day prior  Findings: Lung bases:  Clear lung bases.  1.4 cm azygo-esophageal recess node.  Normal heart size with coronary artery atherosclerosis.  A small to moderate right pleural effusion is not significantly changed. Bilateral cardiophrenic angle nodes which are mildly enlarged.  Largest is on the left at 8 mm.  Abdomen/pelvis:  Normal appearance of the liver.  Old granulomas disease in the spleen.  Normal stomach, pancreas.  The gallbladder is incompletely distended.  No  calcified stones or wall thickening. No biliary ductal dilatation.  Normal adrenal glands and kidneys.  Double IVC. No retroperitoneal or retrocrural adenopathy.  Scattered colonic diverticula.  Normal terminal ileum and appendix.  Normal small bowel caliber.  Small volume ascites is identified, including within both pericolic gutters and in the pelvic cul-de- sac.  There has been interval ventral abdominal wall hernia repair since 03/08/2007.  Interstitial thickening is identified within the right upper quadrant and extending into the anterior right abdomen. No definite localizing source is seen.  No well-defined peritoneal mass.  No inflamed adjacent bowel loops.  There are mildly prominent collateral veins in this region including on image 25/series 2.  No pelvic adenopathy.    Normal urinary bladder and prostate.  Bones/Musculoskeletal:  No acute osseous abnormality.  IMPRESSION:  1.  Similar moderate right-sided pleural effusion with lower thoracic adenopathy, indeterminate. 2.  Edema/interstitial thickening within the intraperitoneal space of the right upper quadrant.  No definite localizing source is seen.  There has been prior ventral hernia repair and there are prominent collateral veins in this region.  Question whether this interstitial thickening is postoperative.  Depending on the right upper quadrant symptomatology, potential clinical strategies of ultrasound to evaluate the gallbladder versus follow-up CT to confirm stability or resolution (i.e.  4 - 6 weeks). 3.  Small volume concurrent abdominal pelvic ascites, indeterminate etiology. 4.  Double IVC.   Original Report Authenticated By: Jeronimo Greaves, M.D.         Subjective: Patient denies any chest discomfort, shortness of breath. He continues to have a nonproductive cough. Denies any hemoptysis. No nausea, vomiting, abdominal pain, diarrhea, dysuria, hematuria.  Objective: Filed Vitals:   01/27/13 0027 01/27/13 0500 01/27/13 0532  01/27/13 1300  BP:  127/73  110/75  Pulse:  87  100  Temp: 98.4 F (36.9 C) 98.8 F (37.1 C)  100.3 F (37.9 C)  TempSrc: Oral Oral  Oral  Resp:  18  18  Height:      Weight:   70.7 kg (155 lb 13.8 oz)   SpO2:  100%  99%    Intake/Output Summary (Last 24 hours) at 01/27/13 1629 Last data filed at 01/27/13 0500  Gross per 24 hour  Intake    995 ml  Output   1575 ml  Net   -580 ml   Weight change: 0.982 kg (2 lb 2.6 oz) Exam:   General:  Pt is alert, follows commands appropriately, not in acute distress  HEENT: No icterus, No thrush,  [place]/AT  Cardiovascular: RRR, S1/S2, no rubs, no gallops  Respiratory: Diminished breath sounds right base. Left basilar crackles. No wheezes. Good air movement.  Abdomen: Soft/+BS, non tender, non distended, no guarding  Extremities: No edema, No lymphangitis, No petechiae, No rashes, no synovitis  Data Reviewed: Basic Metabolic Panel:  Recent Labs Lab 01/23/13 1310 01/24/13 0520 01/25/13 0428 01/26/13 0435 01/27/13 0618  NA  --  130* 134* 133* 132*  K  --  3.7 4.1 3.7 3.6  CL  --  98 101 99 101  CO2  --  23 24 24 23   GLUCOSE  --  93 91 91 99  BUN  --  9 10 8 6   CREATININE 1.00 1.12 0.99 0.93 0.85  CALCIUM  --  7.9* 8.2* 8.0* 7.5*   Liver Function Tests:  Recent Labs Lab 01/24/13 0520 01/26/13 0435 01/27/13 0618  AST 39* 60* 66*  ALT 28 53 60*  ALKPHOS 66 76 73  BILITOT 0.5 0.4 0.3  PROT 6.9 7.3 6.7  ALBUMIN 2.5* 2.6* 2.4*   No results found for this basename: LIPASE, AMYLASE,  in the last 168 hours No results found for this basename: AMMONIA,  in the last 168 hours CBC:  Recent Labs Lab 01/23/13 1310 01/24/13 0520 01/25/13 0428 01/27/13 0618  WBC 4.7 4.9 4.2 4.0  NEUTROABS  --   --  2.1 2.5  HGB 13.9 11.3* 11.9* 10.8*  HCT 39.1 31.6* 34.1* 30.7*  MCV 86.1 86.8 87.9 86.5  PLT 229 231 234 228   Cardiac Enzymes: No results found for this basename: CKTOTAL, CKMB, CKMBINDEX, TROPONINI,  in the last 168  hours BNP: No components found with this basename: POCBNP,  CBG: No results found for this basename: GLUCAP,  in the last 168 hours  Recent Results (from the past 240 hour(s))  URINE CULTURE     Status: None   Collection Time    01/23/13  2:13 PM      Result Value Range Status   Specimen Description URINE, RANDOM   Final   Special Requests Immunocompromised   Final   Culture  Setup Time 01/23/2013 18:38   Final   Colony Count NO GROWTH   Final   Culture NO GROWTH   Final   Report Status 01/24/2013 FINAL   Final  MALARIA SMEAR     Status: None   Collection Time    01/23/13 11:57 PM      Result Value Range Status   Specimen Description Blood   Final   Special Requests Immunocompromised   Final   Malaria Prep     Final   Value: No Plasmodium or Other Blood Parasites Seen on Thick or Thin Smears For persons strongly suspected of having a  blood parasite,but have negative smears, it is recommended that blood films be repeated approximately every 12 to 24 hours for 3 consecutive      days.   Report Status 01/25/2013 FINAL   Final  CULTURE, BLOOD (ROUTINE X 2)     Status: None   Collection Time    01/23/13 11:58 PM      Result Value Range Status   Specimen Description BLOOD RIGHT ARM   Final   Special Requests BOTTLES DRAWN AEROBIC ONLY 10CC   Final   Culture  Setup Time 01/24/2013 05:06   Final   Culture     Final   Value:        BLOOD CULTURE RECEIVED NO GROWTH TO DATE CULTURE WILL BE HELD FOR 5 DAYS BEFORE ISSUING A FINAL NEGATIVE REPORT   Report Status PENDING   Incomplete  CULTURE, BLOOD (ROUTINE X 2)     Status: None   Collection Time    01/23/13 11:58 PM      Result Value Range Status   Specimen Description BLOOD LEFT ARM   Final   Special Requests BOTTLES DRAWN AEROBIC ONLY 10CC   Final   Culture  Setup Time 01/24/2013 05:06   Final   Culture     Final   Value: STAPHYLOCOCCUS SPECIES (COAGULASE NEGATIVE)     Note: THE SIGNIFICANCE OF ISOLATING THIS ORGANISM FROM A SINGLE  SET OF BLOOD CULTURES WHEN MULTIPLE SETS ARE DRAWN IS UNCERTAIN. PLEASE NOTIFY THE MICROBIOLOGY DEPARTMENT WITHIN ONE WEEK IF SPECIATION AND SENSITIVITIES ARE REQUIRED.     Note: Gram Stain Report Called to,Read Back By and Verified With: DALLAS HART AT 04:33 ON 01/25/2013   Report Status 01/26/2013 FINAL   Final  CULTURE, EXPECTORATED SPUTUM-ASSESSMENT     Status: None   Collection Time    01/24/13  7:53 AM      Result Value Range Status   Specimen Description SPUTUM   Final   Special Requests Immunocompromised   Final   Sputum evaluation     Final   Value: MICROSCOPIC FINDINGS SUGGEST THAT THIS SPECIMEN IS NOT REPRESENTATIVE OF LOWER RESPIRATORY SECRETIONS. PLEASE RECOLLECT.     Gram Stain Report Called to,Read Back By and Verified With: Pablo Lawrence RN 8:45 01/24/13 ([email or web address])   Report Status 01/24/2013 FINAL   Final  AFB CULTURE WITH SMEAR     Status: None   Collection Time    01/24/13  7:53 AM      Result Value Range Status   Specimen Description SPUTUM   Final   Special Requests ADD [id number]   Final   ACID FAST SMEAR NO ACID FAST BACILLI SEEN   Final   Culture     Final   Value: CULTURE WILL BE EXAMINED FOR 6 WEEKS BEFORE ISSUING A FINAL REPORT   Report Status PENDING   Incomplete  RESPIRATORY VIRUS PANEL     Status: None   Collection Time    01/24/13  8:18 AM      Result Value Range Status   Source - RVPAN     Corrected   Value: CORRECTED ON 06/26 AT 0848: PREVIOUSLY REPORTED AS NASAL SWAB   Comment: CORRECTED ON 06/27 AT 1808: PREVIOUSLY REPORTED AS NASOPHARYNGEAL, CORRECTED ON 06/26 AT 0848: PREVIOUSLY REPORTED AS NASAL SWAB   Respiratory Syncytial Virus A NOT DETECTED   Final   Respiratory Syncytial Virus B NOT DETECTED   Final   Influenza A NOT DETECTED   Final   Influenza  B NOT DETECTED   Final   Parainfluenza 1 NOT DETECTED   Final   Parainfluenza 2 NOT DETECTED   Final   Parainfluenza 3 NOT DETECTED   Final   Metapneumovirus NOT DETECTED   Final   Rhinovirus NOT  DETECTED   Final   Adenovirus NOT DETECTED   Final   Influenza A H1 NOT DETECTED   Final   Influenza A H3 NOT DETECTED   Final   Comment: (NOTE)           Normal Reference Range for each Analyte: NOT DETECTED     Testing performed using the [employer] xTAG Respiratory Viral Panel test     kit.     This test was developed and its performance characteristics determined     by [employer]. It has not been cleared or approved by the [place]     Food and Drug Administration. This test is used for clinical purposes.     It should not be regarded as investigational or for research. This     laboratory is certified under the Clinical Laboratory Improvement     Amendments of 1988 (CLIA) as qualified to perform high complexity     clinical laboratory testing.  AFB CULTURE WITH SMEAR     Status: None   Collection Time    01/24/13  6:18 PM      Result Value Range Status   Specimen Description SPUTUM   Final   Special Requests Immunocompromised   Final   ACID FAST SMEAR NO ACID FAST BACILLI SEEN   Final   Culture     Final   Value: CULTURE WILL BE EXAMINED FOR 6 WEEKS BEFORE ISSUING A FINAL REPORT   Report Status PENDING   Incomplete  AFB CULTURE WITH SMEAR     Status: None   Collection Time    01/25/13  2:27 PM      Result Value Range Status   Specimen Description SPUTUM   Final   Special Requests Immunocompromised   Final   ACID FAST SMEAR NO ACID FAST BACILLI SEEN   Final   Culture     Final   Value: CULTURE WILL BE EXAMINED FOR 6 WEEKS BEFORE ISSUING A FINAL REPORT   Report Status PENDING   Incomplete  CLOSTRIDIUM DIFFICILE BY PCR     Status: None   Collection Time    01/25/13  9:25 PM      Result Value Range Status   C difficile by pcr NEGATIVE  NEGATIVE Final     Scheduled Meds: . azithromycin  500 mg Intravenous Q24H  . cefTRIAXone (ROCEPHIN)  IV  1 g Intravenous Q24H  . enoxaparin (LOVENOX) injection  40 mg Subcutaneous Q24H  . feeding supplement  237 mL Oral BID BM  .  guaiFENesin  600 mg Oral BID  . multivitamin with minerals  1 tablet Oral Daily  . sodium chloride  3 mL Intravenous Q12H   Continuous Infusions: . sodium chloride 75 mL/hr at 01/27/13 6045     Aedin Jeansonne, DO  [facility] Pager [phone]  If 7PM-7AM, please contact night-coverage [email or web address] Password [facility] 01/27/2013, 4:29 PM   LOS: 4 days

## 2013-01-27 NOTE — Progress Notes (Signed)
[facility]  Date of Admission:  01/23/2013  Antibiotics: Vancomycin 2 days Rocephin Azithromycin day 4  Subjective: Continues to have cough, fever to 102 last night  Objective: Temp:  [98.4 F (36.9 C)-102.5 F (39.2 C)] 98.8 F (37.1 C) (06/29 0500) Pulse Rate:  [87-100] 87 (06/29 0500) Resp:  [18] 18 (06/29 0500) BP: (121-128)/(73-81) 127/73 mmHg (06/29 0500) SpO2:  [96 %-100 %] 100 % (06/29 0500) Weight:  [155 lb 13.8 oz (70.7 kg)] 155 lb 13.8 oz (70.7 kg) (06/29 0532)  General: Awake, alert, fatigued appearing Skin: no rashes Lungs: diminished right base, no wheezes Cor: RRR without m/r/g Abdomen: soft, nt, nd   Lab Results Lab Results  Component Value Date   WBC 4.0 01/27/2013   HGB 10.8* 01/27/2013   HCT 30.7* 01/27/2013   MCV 86.5 01/27/2013   PLT 228 01/27/2013    Lab Results  Component Value Date   CREATININE 0.85 01/27/2013   BUN 6 01/27/2013   NA 132* 01/27/2013   K 3.6 01/27/2013   CL 101 01/27/2013   CO2 23 01/27/2013    Lab Results  Component Value Date   ALT 60* 01/27/2013   AST 66* 01/27/2013   ALKPHOS 73 01/27/2013   BILITOT 0.3 01/27/2013      Microbiology: Recent Results (from the past 240 hour(s))  URINE CULTURE     Status: None   Collection Time    01/23/13  2:13 PM      Result Value Range Status   Specimen Description URINE, RANDOM   Final   Special Requests Immunocompromised   Final   Culture  Setup Time 01/23/2013 18:38   Final   Colony Count NO GROWTH   Final   Culture NO GROWTH   Final   Report Status 01/24/2013 FINAL   Final  MALARIA SMEAR     Status: None   Collection Time    01/23/13 11:57 PM      Result Value Range Status   Specimen Description Blood   Final   Special Requests Immunocompromised   Final   Malaria Prep     Final   Value: No Plasmodium or Other Blood Parasites Seen on Thick or Thin Smears For persons strongly suspected of having a blood parasite,but have negative smears, it is  recommended that blood films be repeated approximately every 12 to 24 hours for 3 consecutive      days.   Report Status 01/25/2013 FINAL   Final  CULTURE, BLOOD (ROUTINE X 2)     Status: None   Collection Time    01/23/13 11:58 PM      Result Value Range Status   Specimen Description BLOOD RIGHT ARM   Final   Special Requests BOTTLES DRAWN AEROBIC ONLY 10CC   Final   Culture  Setup Time 01/24/2013 05:06   Final   Culture     Final   Value:        BLOOD CULTURE RECEIVED NO GROWTH TO DATE CULTURE WILL BE HELD FOR 5 DAYS BEFORE ISSUING A FINAL NEGATIVE REPORT   Report Status PENDING   Incomplete  CULTURE, BLOOD (ROUTINE X 2)     Status: None   Collection Time    01/23/13 11:58 PM      Result Value Range Status   Specimen Description BLOOD LEFT ARM   Final   Special Requests BOTTLES DRAWN AEROBIC ONLY 10CC   Final   Culture  Setup Time 01/24/2013 05:06  Final   Culture     Final   Value: STAPHYLOCOCCUS SPECIES (COAGULASE NEGATIVE)     Note: THE SIGNIFICANCE OF ISOLATING THIS ORGANISM FROM A SINGLE SET OF BLOOD CULTURES WHEN MULTIPLE SETS ARE DRAWN IS UNCERTAIN. PLEASE NOTIFY THE MICROBIOLOGY DEPARTMENT WITHIN ONE WEEK IF SPECIATION AND SENSITIVITIES ARE REQUIRED.     Note: Gram Stain Report Called to,Read Back By and Verified With: DALLAS HART AT 04:33 ON 01/25/2013   Report Status 01/26/2013 FINAL   Final  CULTURE, EXPECTORATED SPUTUM-ASSESSMENT     Status: None   Collection Time    01/24/13  7:53 AM      Result Value Range Status   Specimen Description SPUTUM   Final   Special Requests Immunocompromised   Final   Sputum evaluation     Final   Value: MICROSCOPIC FINDINGS SUGGEST THAT THIS SPECIMEN IS NOT REPRESENTATIVE OF LOWER RESPIRATORY SECRETIONS. PLEASE RECOLLECT.     Gram Stain Report Called to,Read Back By and Verified With: Pablo Lawrence RN 8:45 01/24/13 ([email or web address])   Report Status 01/24/2013 FINAL   Final  AFB CULTURE WITH SMEAR     Status: None   Collection Time    01/24/13  7:53  AM      Result Value Range Status   Specimen Description SPUTUM   Final   Special Requests ADD [id number]   Final   ACID FAST SMEAR NO ACID FAST BACILLI SEEN   Final   Culture     Final   Value: CULTURE WILL BE EXAMINED FOR 6 WEEKS BEFORE ISSUING A FINAL REPORT   Report Status PENDING   Incomplete  RESPIRATORY VIRUS PANEL     Status: None   Collection Time    01/24/13  8:18 AM      Result Value Range Status   Source - RVPAN     Corrected   Value: CORRECTED ON 06/26 AT 0848: PREVIOUSLY REPORTED AS NASAL SWAB   Comment: CORRECTED ON 06/27 AT 1808: PREVIOUSLY REPORTED AS NASOPHARYNGEAL, CORRECTED ON 06/26 AT 0848: PREVIOUSLY REPORTED AS NASAL SWAB   Respiratory Syncytial Virus A NOT DETECTED   Final   Respiratory Syncytial Virus B NOT DETECTED   Final   Influenza A NOT DETECTED   Final   Influenza B NOT DETECTED   Final   Parainfluenza 1 NOT DETECTED   Final   Parainfluenza 2 NOT DETECTED   Final   Parainfluenza 3 NOT DETECTED   Final   Metapneumovirus NOT DETECTED   Final   Rhinovirus NOT DETECTED   Final   Adenovirus NOT DETECTED   Final   Influenza A H1 NOT DETECTED   Final   Influenza A H3 NOT DETECTED   Final   Comment: (NOTE)           Normal Reference Range for each Analyte: NOT DETECTED     Testing performed using the Luminex xTAG Respiratory Viral Panel test     kit.     This test was developed and its performance characteristics determined     by [employer]. It has not been cleared or approved by the [place]     Food and Drug Administration. This test is used for clinical purposes.     It should not be regarded as investigational or for research. This     laboratory is certified under the Clinical Laboratory Improvement     Amendments of 1988 (CLIA) as qualified to perform high complexity     clinical  laboratory testing.  AFB CULTURE WITH SMEAR     Status: None   Collection Time    01/24/13  6:18 PM      Result Value Range Status   Specimen Description SPUTUM    Final   Special Requests Immunocompromised   Final   ACID FAST SMEAR NO ACID FAST BACILLI SEEN   Final   Culture     Final   Value: CULTURE WILL BE EXAMINED FOR 6 WEEKS BEFORE ISSUING A FINAL REPORT   Report Status PENDING   Incomplete  AFB CULTURE WITH SMEAR     Status: None   Collection Time    01/25/13  2:27 PM      Result Value Range Status   Specimen Description SPUTUM   Final   Special Requests Immunocompromised   Final   ACID FAST SMEAR NO ACID FAST BACILLI SEEN   Final   Culture     Final   Value: CULTURE WILL BE EXAMINED FOR 6 WEEKS BEFORE ISSUING A FINAL REPORT   Report Status PENDING   Incomplete  CLOSTRIDIUM DIFFICILE BY PCR     Status: None   Collection Time    01/25/13  9:25 PM      Result Value Range Status   C difficile by pcr NEGATIVE  NEGATIVE Final    Studies/Results: Ct Chest Wo Contrast  01/25/2013   *RADIOLOGY REPORT*  Clinical Data: Cough.  Shortness of breath.  Chest pain.  Probable pneumonia on recent imaging.  Immunocompromised patient.  CT CHEST WITHOUT CONTRAST  Technique:  Multidetector CT imaging of the chest was performed following the standard protocol without IV contrast.  Comparison: No prior chest CT.  Two-view chest x-ray 01/23/2013.  Findings: Respiratory motion blurred many of the images.  Large right pleural effusion with associated passive atelectasis in the right lower lobe.  No confluent airspace consolidation elsewhere. Tree in bud opacities in the inferior right upper lobe.  2 mm subpleural nodule in the inferior left upper lobe (series 3, image 24) and polygonal subpleural nodule in the superior segment left lower lobe adjacent to the fissure (image 20).  No significant pulmonary parenchymal nodules.  No left pleural effusion.  Central airways patent without significant bronchial wall thickening.  Heart size normal.  Prominent epicardial fat and prominent paracardiac fat pads.  Mild to moderate LAD coronary atherosclerosis.  No pericardial  effusion.  Mild atherosclerosis involving the thoracic and upper abdominal aorta without aneurysm.  Numerous mediastinal lymph nodes which are borderline in size, the largest a station 4R node at the carina measuring approximately 1.5 x 1.2 cm.  No nodal masses.  Thyroid gland normal in appearance.  Very small hiatal hernia.  Inflammatory changes in the upper abdomen in the midline and right upper quadrant, incompletely imaged.  Bone window images demonstrate diffuse thoracic spondylosis.  IMPRESSION:  1.  Large right pleural effusion and associated passive atelectasis in the right lower lobe. 2.  Tree in bud opacities in the inferior right upper lobe which are consistent with infection/alveolitis. 3.  Inflammatory changes involving the upper abdomen in the midline and right upper quadrant.  These findings are incompletely imaged. Dedicated CT abdomen and pelvis may be helpful for further characterization. 4.  Scattered borderline mediastinal lymph nodes, likely reactive. 5.  Very small hiatal hernia.   Original Report Authenticated By: Hulan Saas, M.D.   Ct Abdomen Pelvis W Contrast  01/26/2013   *RADIOLOGY REPORT*  Clinical Data: Right upper quadrant pain and midline inflammatory  process.  Hypertension and arthritis.  Fever of unknown origin. Immunocompromised state.  CT ABDOMEN AND PELVIS WITH CONTRAST  Technique:  Multidetector CT imaging of the abdomen and pelvis was performed following the standard protocol during bolus administration of intravenous contrast.  Contrast: OMNIPAQUE IOHEXOL 300 MG/ML  SOLN  Comparison: 03/08/2007 abdominal pelvic CT.  Chest CT of 1 day prior  Findings: Lung bases:  Clear lung bases.  1.4 cm azygo-esophageal recess node.  Normal heart size with coronary artery atherosclerosis.  A small to moderate right pleural effusion is not significantly changed. Bilateral cardiophrenic angle nodes which are mildly enlarged.  Largest is on the left at 8 mm.  Abdomen/pelvis:   Normal appearance of the liver.  Old granulomas disease in the spleen.  Normal stomach, pancreas.  The gallbladder is incompletely distended.  No calcified stones or wall thickening. No biliary ductal dilatation.  Normal adrenal glands and kidneys.  Double IVC. No retroperitoneal or retrocrural adenopathy.  Scattered colonic diverticula.  Normal terminal ileum and appendix.  Normal small bowel caliber.  Small volume ascites is identified, including within both pericolic gutters and in the pelvic cul-de- sac.  There has been interval ventral abdominal wall hernia repair since 03/08/2007.  Interstitial thickening is identified within the right upper quadrant and extending into the anterior right abdomen. No definite localizing source is seen.  No well-defined peritoneal mass.  No inflamed adjacent bowel loops.  There are mildly prominent collateral veins in this region including on image 25/series 2.  No pelvic adenopathy.    Normal urinary bladder and prostate.  Bones/Musculoskeletal:  No acute osseous abnormality.  IMPRESSION:  1.  Similar moderate right-sided pleural effusion with lower thoracic adenopathy, indeterminate. 2.  Edema/interstitial thickening within the intraperitoneal space of the right upper quadrant.  No definite localizing source is seen.  There has been prior ventral hernia repair and there are prominent collateral veins in this region.  Question whether this interstitial thickening is postoperative.  Depending on the right upper quadrant symptomatology, potential clinical strategies of ultrasound to evaluate the gallbladder versus follow-up CT to confirm stability or resolution (i.e.  4 - 6 weeks). 3.  Small volume concurrent abdominal pelvic ascites, indeterminate etiology. 4.  Double IVC.   Original Report Authenticated By: Jeronimo Greaves, M.D.    Assessment/Plan: 1) Pneumonia - it is more consistent with an atypical infection.  He continues to have significant dry cough which I suspect is due  to the large pleural effusion noted on CT.  CT abdomen noted.   - thoracentesis for AFB, bacterial cultures, PCP, fungal culture, nocardia along with cell count and differential and cytology.    2) Diarrhea - c diff negative, can treat symptomatically  3)  GPC - now 1/2 CoNS, c/w contaminate.    Staci Righter, MD [facility] [email or web address] [phone] pager   [phone] cell 01/27/2013, 12:42 PM

## 2013-01-28 ENCOUNTER — Inpatient Hospital Stay (HOSPITAL_COMMUNITY): Payer: Medicare Other

## 2013-01-28 LAB — QUANTIFERON TB GOLD ASSAY (BLOOD)
Interferon Gamma Release Assay: POSITIVE — AB
Mitogen value: 8.76 IU/mL
Quantiferon Nil Value: 0.58 IU/mL
TB Ag value: 4.32 IU/mL
TB Antigen Minus Nil Value: 3.74 IU/mL

## 2013-01-28 LAB — PROTEIN, BODY FLUID: Total protein, fluid: 4.9 g/dL

## 2013-01-28 LAB — LACTATE DEHYDROGENASE, PLEURAL OR PERITONEAL FLUID: LD, Fluid: 233 U/L — ABNORMAL HIGH (ref 3–23)

## 2013-01-28 LAB — BODY FLUID CELL COUNT WITH DIFFERENTIAL
Lymphs, Fluid: 39 %
Monocyte-Macrophage-Serous Fluid: 51 % (ref 50–90)
Neutrophil Count, Fluid: 10 % (ref 0–25)
Total Nucleated Cell Count, Fluid: 340 cu mm (ref 0–1000)

## 2013-01-28 LAB — COMPREHENSIVE METABOLIC PANEL
ALT: 55 U/L — ABNORMAL HIGH (ref 0–53)
AST: 54 U/L — ABNORMAL HIGH (ref 0–37)
Albumin: 2.4 g/dL — ABNORMAL LOW (ref 3.5–5.2)
Alkaline Phosphatase: 70 U/L (ref 39–117)
BUN: 6 mg/dL (ref 6–23)
CO2: 25 mEq/L (ref 19–32)
Calcium: 7.8 mg/dL — ABNORMAL LOW (ref 8.4–10.5)
Chloride: 101 mEq/L (ref 96–112)
Creatinine, Ser: 0.93 mg/dL (ref 0.50–1.35)
GFR calc Af Amer: >90 mL/min (ref >90)
GFR calc non Af Amer: 84 mL/min — ABNORMAL LOW (ref >90)
Glucose, Bld: 93 mg/dL (ref 70–99)
Potassium: 3.6 mEq/L (ref 3.5–5.1)
Sodium: 135 mEq/L (ref 135–145)
Total Bilirubin: 0.4 mg/dL (ref 0.3–1.2)
Total Protein: 6.5 g/dL (ref 6.0–8.3)

## 2013-01-28 LAB — LACTATE DEHYDROGENASE: LDH: 200 U/L (ref 94–250)

## 2013-01-28 LAB — GLUCOSE, SEROUS FLUID: Glucose, Fluid: 114 mg/dL

## 2013-01-28 MED ORDER — GUAIFENESIN-DM 100-10 MG/5ML PO SYRP
5.0000 mL | ORAL_SOLUTION | Freq: Three times a day (TID) | ORAL | Status: AC | PRN
  Administered 2013-01-28 – 2013-01-29 (×2): 5 mL via ORAL
  Filled 2013-01-28 (×2): qty 5

## 2013-01-28 NOTE — Progress Notes (Signed)
[facility]  Date of Admission:  01/23/2013  Antibiotics: Rocephin day 5 Azithromycin day 5 (vanco d/c'd)  Subjective: Continues to have dry cough, afebrile for 36hr, last fever on evening of 6/28 to 102.58F, however the patient still having subjective fevers per family report.  Objective: Temp:  [98 F (36.7 C)-100.3 F (37.9 C)] 98 F (36.7 C) (06/30 0626) Pulse Rate:  [74-103] 74 (06/30 0626) Resp:  [18] 18 (06/30 0626) BP: (110-121)/(70-94) 121/94 mmHg (06/30 0626) SpO2:  [95 %-100 %] 100 % (06/30 0626) Weight:  [155 lb 6.8 oz (70.5 kg)] 155 lb 6.8 oz (70.5 kg) (06/30 0626)  No exam since patient is off the floor for thoracentesis  Lab Results Lab Results  Component Value Date   WBC 4.0 01/27/2013   HGB 10.8* 01/27/2013   HCT 30.7* 01/27/2013   MCV 86.5 01/27/2013   PLT 228 01/27/2013    Lab Results  Component Value Date   CREATININE 0.93 01/28/2013   BUN 6 01/28/2013   NA 135 01/28/2013   K 3.6 01/28/2013   CL 101 01/28/2013   CO2 25 01/28/2013    Lab Results  Component Value Date   ALT 55* 01/28/2013   AST 54* 01/28/2013   ALKPHOS 70 01/28/2013   BILITOT 0.4 01/28/2013      Microbiology: Recent Results (from the past 240 hour(s))  URINE CULTURE     Status: None   Collection Time    01/23/13  2:13 PM      Result Value Range Status   Specimen Description URINE, RANDOM   Final   Special Requests Immunocompromised   Final   Culture  Setup Time 01/23/2013 18:38   Final   Colony Count NO GROWTH   Final   Culture NO GROWTH   Final   Report Status 01/24/2013 FINAL   Final  MALARIA SMEAR     Status: None   Collection Time    01/23/13 11:57 PM      Result Value Range Status   Specimen Description Blood   Final   Special Requests Immunocompromised   Final   Malaria Prep     Final   Value: No Plasmodium or Other Blood Parasites Seen on Thick or Thin Smears For persons strongly suspected of having a blood parasite,but have negative smears,  it is recommended that blood films be repeated approximately every 12 to 24 hours for 3 consecutive      days.   Report Status 01/25/2013 FINAL   Final  CULTURE, BLOOD (ROUTINE X 2)     Status: None   Collection Time    01/23/13 11:58 PM      Result Value Range Status   Specimen Description BLOOD RIGHT ARM   Final   Special Requests BOTTLES DRAWN AEROBIC ONLY 10CC   Final   Culture  Setup Time 01/24/2013 05:06   Final   Culture     Final   Value:        BLOOD CULTURE RECEIVED NO GROWTH TO DATE CULTURE WILL BE HELD FOR 5 DAYS BEFORE ISSUING A FINAL NEGATIVE REPORT   Report Status PENDING   Incomplete  CULTURE, BLOOD (ROUTINE X 2)     Status: None   Collection Time    01/23/13 11:58 PM      Result Value Range Status   Specimen Description BLOOD LEFT ARM   Final   Special Requests BOTTLES DRAWN AEROBIC ONLY 10CC   Final   Culture  Setup Time 01/24/2013 05:06   Final   Culture     Final   Value: STAPHYLOCOCCUS SPECIES (COAGULASE NEGATIVE)     Note: THE SIGNIFICANCE OF ISOLATING THIS ORGANISM FROM A SINGLE SET OF BLOOD CULTURES WHEN MULTIPLE SETS ARE DRAWN IS UNCERTAIN. PLEASE NOTIFY THE MICROBIOLOGY DEPARTMENT WITHIN ONE WEEK IF SPECIATION AND SENSITIVITIES ARE REQUIRED.     Note: Gram Stain Report Called to,Read Back By and Verified With: DALLAS HART AT 04:33 ON 01/25/2013   Report Status 01/26/2013 FINAL   Final  CULTURE, EXPECTORATED SPUTUM-ASSESSMENT     Status: None   Collection Time    01/24/13  7:53 AM      Result Value Range Status   Specimen Description SPUTUM   Final   Special Requests Immunocompromised   Final   Sputum evaluation     Final   Value: MICROSCOPIC FINDINGS SUGGEST THAT THIS SPECIMEN IS NOT REPRESENTATIVE OF LOWER RESPIRATORY SECRETIONS. PLEASE RECOLLECT.     Gram Stain Report Called to,Read Back By and Verified With: Pablo Lawrence RN 8:45 01/24/13 ([email or web address])   Report Status 01/24/2013 FINAL   Final  AFB CULTURE WITH SMEAR     Status: None   Collection Time    01/24/13   7:53 AM      Result Value Range Status   Specimen Description SPUTUM   Final   Special Requests ADD [id number]   Final   ACID FAST SMEAR NO ACID FAST BACILLI SEEN   Final   Culture     Final   Value: CULTURE WILL BE EXAMINED FOR 6 WEEKS BEFORE ISSUING A FINAL REPORT   Report Status PENDING   Incomplete  RESPIRATORY VIRUS PANEL     Status: None   Collection Time    01/24/13  8:18 AM      Result Value Range Status   Source - RVPAN     Corrected   Value: CORRECTED ON 06/26 AT 0848: PREVIOUSLY REPORTED AS NASAL SWAB   Comment: CORRECTED ON 06/27 AT 1808: PREVIOUSLY REPORTED AS NASOPHARYNGEAL, CORRECTED ON 06/26 AT 0848: PREVIOUSLY REPORTED AS NASAL SWAB   Respiratory Syncytial Virus A NOT DETECTED   Final   Respiratory Syncytial Virus B NOT DETECTED   Final   Influenza A NOT DETECTED   Final   Influenza B NOT DETECTED   Final   Parainfluenza 1 NOT DETECTED   Final   Parainfluenza 2 NOT DETECTED   Final   Parainfluenza 3 NOT DETECTED   Final   Metapneumovirus NOT DETECTED   Final   Rhinovirus NOT DETECTED   Final   Adenovirus NOT DETECTED   Final   Influenza A H1 NOT DETECTED   Final   Influenza A H3 NOT DETECTED   Final   Comment: (NOTE)           Normal Reference Range for each Analyte: NOT DETECTED     Testing performed using the Luminex xTAG Respiratory Viral Panel test     kit.     This test was developed and its performance characteristics determined     by [employer]. It has not been cleared or approved by the [place]     Food and Drug Administration. This test is used for clinical purposes.     It should not be regarded as investigational or for research. This     laboratory is certified under the Clinical Laboratory Improvement     Amendments of 1988 (CLIA) as qualified to perform high  complexity     clinical laboratory testing.  AFB CULTURE WITH SMEAR     Status: None   Collection Time    01/24/13  6:18 PM      Result Value Range Status   Specimen Description  SPUTUM   Final   Special Requests Immunocompromised   Final   ACID FAST SMEAR NO ACID FAST BACILLI SEEN   Final   Culture     Final   Value: CULTURE WILL BE EXAMINED FOR 6 WEEKS BEFORE ISSUING A FINAL REPORT   Report Status PENDING   Incomplete  AFB CULTURE WITH SMEAR     Status: None   Collection Time    01/25/13  2:27 PM      Result Value Range Status   Specimen Description SPUTUM   Final   Special Requests Immunocompromised   Final   ACID FAST SMEAR NO ACID FAST BACILLI SEEN   Final   Culture     Final   Value: CULTURE WILL BE EXAMINED FOR 6 WEEKS BEFORE ISSUING A FINAL REPORT   Report Status PENDING   Incomplete  CLOSTRIDIUM DIFFICILE BY PCR     Status: None   Collection Time    01/25/13  9:25 PM      Result Value Range Status   C difficile by pcr NEGATIVE  NEGATIVE Final    Studies/Results: Ct Abdomen Pelvis W Contrast  01/26/2013   *RADIOLOGY REPORT*  Clinical Data: Right upper quadrant pain and midline inflammatory process.  Hypertension and arthritis.  Fever of unknown origin. Immunocompromised state.  CT ABDOMEN AND PELVIS WITH CONTRAST  Technique:  Multidetector CT imaging of the abdomen and pelvis was performed following the standard protocol during bolus administration of intravenous contrast.  Contrast: OMNIPAQUE IOHEXOL 300 MG/ML  SOLN  Comparison: 03/08/2007 abdominal pelvic CT.  Chest CT of 1 day prior  Findings: Lung bases:  Clear lung bases.  1.4 cm azygo-esophageal recess node.  Normal heart size with coronary artery atherosclerosis.  A small to moderate right pleural effusion is not significantly changed. Bilateral cardiophrenic angle nodes which are mildly enlarged.  Largest is on the left at 8 mm.  Abdomen/pelvis:  Normal appearance of the liver.  Old granulomas disease in the spleen.  Normal stomach, pancreas.  The gallbladder is incompletely distended.  No calcified stones or wall thickening. No biliary ductal dilatation.  Normal adrenal glands and kidneys.   Double IVC. No retroperitoneal or retrocrural adenopathy.  Scattered colonic diverticula.  Normal terminal ileum and appendix.  Normal small bowel caliber.  Small volume ascites is identified, including within both pericolic gutters and in the pelvic cul-de- sac.  There has been interval ventral abdominal wall hernia repair since 03/08/2007.  Interstitial thickening is identified within the right upper quadrant and extending into the anterior right abdomen. No definite localizing source is seen.  No well-defined peritoneal mass.  No inflamed adjacent bowel loops.  There are mildly prominent collateral veins in this region including on image 25/series 2.  No pelvic adenopathy.    Normal urinary bladder and prostate.  Bones/Musculoskeletal:  No acute osseous abnormality.  IMPRESSION:  1.  Similar moderate right-sided pleural effusion with lower thoracic adenopathy, indeterminate. 2.  Edema/interstitial thickening within the intraperitoneal space of the right upper quadrant.  No definite localizing source is seen.  There has been prior ventral hernia repair and there are prominent collateral veins in this region.  Question whether this interstitial thickening is postoperative.  Depending on the right  upper quadrant symptomatology, potential clinical strategies of ultrasound to evaluate the gallbladder versus follow-up CT to confirm stability or resolution (i.e.  4 - 6 weeks). 3.  Small volume concurrent abdominal pelvic ascites, indeterminate etiology. 4.  Double IVC.   Original Report Authenticated By: Jeronimo Greaves, M.D.    Assessment/Plan: 1) Pneumonia - likely due to an atypical infection.  He continues to have significant dry cough, probably impacted by the pleural effusion noted on CT.  Awaiting to have thoracentesis for AFB, bacterial cultures, PCP, fungal culture, nocardia along with cell count and differential and cytology.  Please get serum LDH and protein to do light's criteria  Concern for aspiration  pneumonia still exists since he coughs often with food and liquids. Please do swallow eval.  2) Diarrhea - c diff negative, can treat symptomatically  3)  CoNS bacteremia- found in 1 of 2 bottles, c/w contaminate. No need to treat   Dr. Orvan Falconer to provide recs tomorrow  Judyann Munson, MD [facility] [email or web address] [phone] pager    01/28/2013, 9:30 AM

## 2013-01-28 NOTE — Progress Notes (Signed)
Pt had thoracentesis done in [place], spoke with Morrie Sheldon after receiving message from secretary to call lab, stated that lab need to know something about fluid sent.  Informed by [name],  In lab that she needed to know about fluid collected in [place] and needed Dr. Don Perking number.  Number given for clarification.  Amanda Pea, Charity fundraiser.

## 2013-01-28 NOTE — Procedures (Signed)
Rt pleural effusion  US guided thora 170 cc cloudy yellow fluid  cxr Pending

## 2013-01-28 NOTE — Progress Notes (Signed)
Pt for thoracentesis today.  Notified by Morrie Sheldon in [place] that they probably get to pt in few hours not sure of exact time.  Pt and family informed.  Verbalized understanding.  Amanda Pea, Charity fundraiser.

## 2013-01-28 NOTE — Progress Notes (Signed)
[facility] PROGRESS NOTE  Alan Roman ZOX:[id number] DOB: [birth year] DOA: 01/23/2013 PCP: Pcp Not In System  Brief history 67 year old gentleman with a history of psoriasis diagnosed 10 years ago, previously on prednisone, methotrexate, and Enbrel x 5 years.  Most recently, the patient has been placed on a Humira for the past 2 years. His last dose was on 12/24/2012. The patient presented with 3 weeks of intermittent fevers ranging from 100.0-101.18F with a maximum of 102.18F associated with chills. The patient also endorsed a dry nonproductive cough and intermittent occipital headache. The patient was treated with amoxicillin by his primary care physician 2 weeks ago for bronchitis. He completed a course of antibiotics. He went to see another physician who diagnosed him with a UTI and started him on Bactrim. The patient continued to have fevers and fatigue. He also endorses a 7 pound weight loss in the past 3 weeks. The patient had 2 visits [place], most recently returned end of March 2014.  He did not take malaria prophylaxis, but he stayed in screened rooms with airconditioning.  No sick contacts.  He drank bottled water.  He was admitted to [facility] on 01/21/2013. He was started on ceftriaxone and acyclovir. His labs at [facility] show WBC 5.4, Hb 11.8, platelets 247. His differential showed slightly increased monocytes with 23.4 %. He also had hyponatremia of 128 in consecutive BMPs there. His ECHO did not show any evidence of vegetations but he had thickened MV with mild MR. His monospot test was negative.  He was transferred to [facility] on 01/23/13 do to FUO. Since admission, the patient has continued to have intermittent fevers. Infectious disease was consulted. Initial chest x-ray revealed right basilar density with parapneumonic effusion. The patient was started on ceftriaxone and azithromycin. His fevers persisted. Sputum AFB smears were negative x3 The patient had abnormal CT of the chest with  "tree in bud" opacity in RUL. Pulmonology was consulted. They recommended thoracocentesis. CT of the abdomen showed interstitial thickening of the right upper quadrant intraperitoneal space there was nonspecific. Thoracocentesis was performed on 01/28/2013. Fluid is exudative.   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 difficile PCR--neg  -CT abdomen and pelvis--no gallstones or gallbladder wall thickening. Interstitial thickening in the right upper quadrant intraperitoneal space, small ascites  -thoracocentesis (01/28/13)- 170cc cloudy fluid -ordered pleural fluid for silver stain, AFB culture/strain, routine Gram stain/culture, cytology, cell count, LDH, protein, glucose  -HIV serology  Pleural effusion -Exudative by light criteria -340 WBCs--90% monocytes -Await culture data Bacteremia  -Likely to be contaminant  -d/c vancomycin  Pneumonia  -01/23/2013 chest x-ray shows right basilar opacity with parapneumonic effusion  Transaminasemia -stable  -likely due to ceftriaxone and azithromycin  -continue to monitor  Hyponatremia  -Likely volume depletion, poor solute intake  -Start normal saline-->improving  -Check TSH 2.384  Hypertension  -controlled -hold amlodipine, blood pressure soft  weight loss, generalized weakness  -Physical therapy  -Check prealbumin--10.0  -Start nutritional supplement  Family Communication: Daughter at beside  Disposition Plan: Home when medically stable  Antibiotics:  Ceftriaxone 01/23/2013>>>  Azithromycin  01/23/2013>>> Procedures: --Thoracocentesis--01/28/13           Procedures/Studies: Dg Chest 2 View  01/23/2013   *  RADIOLOGY REPORT*  Clinical Data: Fever.  Cough.  CHEST - 2 VIEW  Comparison: None.  Findings: Probable small right subpulmonic pleural effusion. Blunting of right costophrenic angle is present.  Right basilar volume loss/atelectasis.  Patchy airspace density extends to the right costophrenic angle suspicious for pneumonia.  Unchanged elevation of the right hemidiaphragm with right basilar atelectasis.  Small sclerotic focus of the posterior aspect of the T5 vertebra of these probably unchanged compared to 05/29/2012 allowing for differences in projection.  This may be associated with rib trauma or costovertebral osteoarthritis.  Follow-up to ensure radiographic clearing recommended.  Clearing is usually observed at 8 weeks.  IMPRESSION: Right basilar/right lower lobe airspace disease with probable small parapneumonic effusion.  Findings suspicious for pneumonia.   Original Report Authenticated By: Andreas Newport, M.D.   Ct Chest Wo Contrast  01/25/2013   *RADIOLOGY REPORT*  Clinical Data: Cough.  Shortness of breath.  Chest pain.  Probable pneumonia on recent imaging.  Immunocompromised patient.  CT CHEST WITHOUT CONTRAST  Technique:  Multidetector CT imaging of the chest was performed following the standard protocol without IV contrast.  Comparison: No prior chest CT.  Two-view chest x-ray 01/23/2013.  Findings: Respiratory motion blurred many of the images.  Large right pleural effusion with associated passive atelectasis in the right lower lobe.  No confluent airspace consolidation elsewhere. Tree in bud opacities in the inferior right upper lobe.  2 mm subpleural nodule in the inferior left upper lobe (series 3, image 24) and polygonal subpleural nodule in the superior segment left lower lobe adjacent to the fissure (image 20).  No significant pulmonary parenchymal nodules.  No  left pleural effusion.  Central airways patent without significant bronchial wall thickening.  Heart size normal.  Prominent epicardial fat and prominent paracardiac fat pads.  Mild to moderate LAD coronary atherosclerosis.  No pericardial effusion.  Mild atherosclerosis involving the thoracic and upper abdominal aorta without aneurysm.  Numerous mediastinal lymph nodes which are borderline in size, the largest a station 4R node at the carina measuring approximately 1.5 x 1.2 cm.  No nodal masses.  Thyroid gland normal in appearance.  Very small hiatal hernia.  Inflammatory changes in the upper abdomen in the midline and right upper quadrant, incompletely imaged.  Bone window images demonstrate diffuse thoracic spondylosis.  IMPRESSION:  1.  Large right pleural effusion and associated passive atelectasis in the right lower lobe. 2.  Tree in bud opacities in the inferior right upper lobe which are consistent with infection/alveolitis. 3.  Inflammatory changes involving the upper abdomen in the midline and right upper quadrant.  These findings are incompletely imaged. Dedicated CT abdomen and pelvis may be helpful for further characterization. 4.  Scattered borderline mediastinal lymph nodes, likely reactive. 5.  Very small hiatal hernia.   Original Report Authenticated By: Hulan Saas, M.D.   Ct Abdomen Pelvis W Contrast  01/26/2013   *RADIOLOGY REPORT*  Clinical Data: Right upper quadrant pain and midline inflammatory process.  Hypertension and arthritis.  Fever of unknown origin. Immunocompromised state.  CT ABDOMEN AND PELVIS WITH CONTRAST  Technique:  Multidetector CT imaging of the abdomen and pelvis was performed following the standard protocol during bolus administration of intravenous contrast.  Contrast: OMNIPAQUE IOHEXOL 300 MG/ML  SOLN  Comparison: 03/08/2007 abdominal pelvic CT.  Chest CT of 1 day prior  Findings: Lung bases:  Clear lung bases.  1.4 cm azygo-esophageal recess node.  Normal  heart size with coronary artery atherosclerosis.  A small to  moderate right pleural effusion is not significantly changed. Bilateral cardiophrenic angle nodes which are mildly enlarged.  Largest is on the left at 8 mm.  Abdomen/pelvis:  Normal appearance of the liver.  Old granulomas disease in the spleen.  Normal stomach, pancreas.  The gallbladder is incompletely distended.  No calcified stones or wall thickening. No biliary ductal dilatation.  Normal adrenal glands and kidneys.  Double IVC. No retroperitoneal or retrocrural adenopathy.  Scattered colonic diverticula.  Normal terminal ileum and appendix.  Normal small bowel caliber.  Small volume ascites is identified, including within both pericolic gutters and in the pelvic cul-de- sac.  There has been interval ventral abdominal wall hernia repair since 03/08/2007.  Interstitial thickening is identified within the right upper quadrant and extending into the anterior right abdomen. No definite localizing source is seen.  No well-defined peritoneal mass.  No inflamed adjacent bowel loops.  There are mildly prominent collateral veins in this region including on image 25/series 2.  No pelvic adenopathy.    Normal urinary bladder and prostate.  Bones/Musculoskeletal:  No acute osseous abnormality.  IMPRESSION:  1.  Similar moderate right-sided pleural effusion with lower thoracic adenopathy, indeterminate. 2.  Edema/interstitial thickening within the intraperitoneal space of the right upper quadrant.  No definite localizing source is seen.  There has been prior ventral hernia repair and there are prominent collateral veins in this region.  Question whether this interstitial thickening is postoperative.  Depending on the right upper quadrant symptomatology, potential clinical strategies of ultrasound to evaluate the gallbladder versus follow-up CT to confirm stability or resolution (i.e.  4 - 6 weeks). 3.  Small volume concurrent abdominal pelvic ascites,  indeterminate etiology. 4.  Double IVC.   Original Report Authenticated By: Jeronimo Greaves, M.D.         Subjective: Patient continues to complain of intermittent occipital headache. Denies any chest pain, shortness breath, dizziness, visual disturbance, nausea, vomiting, diarrhea, abdominal pain, dysuria, hematuria. He is eating well.  Objective: Filed Vitals:   01/28/13 0626 01/28/13 1039 01/28/13 1301 01/28/13 1349  BP: 121/94   123/73  Pulse: 74  79 93  Temp: 98 F (36.7 C) 98.8 F (37.1 C)  99.1 F (37.3 C)  TempSrc: Oral Oral  Oral  Resp: 18   20  Height:      Weight: 70.5 kg (155 lb 6.8 oz)     SpO2: 100%   99%    Intake/Output Summary (Last 24 hours) at 01/28/13 1439 Last data filed at 01/28/13 1348  Gross per 24 hour  Intake    890 ml  Output   2825 ml  Net  -1935 ml   Weight change: -0.2 kg (-7.1 oz) Exam:   General:  Pt is alert, follows commands appropriately, not in acute distress  HEENT: No icterus, No thrush,  [place]/AT, no meningismus  Cardiovascular: RRR, S1/S2, no rubs, no gallops  Respiratory: Bibasilar crackles. Left clear to auscultation. No wheezes. Good air movement.  Abdomen: Soft/+BS, non tender, non distended, no guarding  Extremities: No edema, No lymphangitis, No petechiae, No rashes, no synovitis  Data Reviewed: Basic Metabolic Panel:  Recent Labs Lab 01/24/13 0520 01/25/13 0428 01/26/13 0435 01/27/13 0618 01/28/13 0545  NA 130* 134* 133* 132* 135  K 3.7 4.1 3.7 3.6 3.6  CL 98 101 99 101 101  CO2 23 24 24 23 25   GLUCOSE 93 91 91 99 93  BUN 9 10 8 6 6   CREATININE 1.12 0.99 0.93 0.85 0.93  CALCIUM 7.9* 8.2* 8.0* 7.5* 7.8*   Liver Function Tests:  Recent Labs Lab 01/24/13 0520 01/26/13 0435 01/27/13 0618 01/28/13 0545  AST 39* 60* 66* 54*  ALT 28 53 60* 55*  ALKPHOS 66 76 73 70  BILITOT 0.5 0.4 0.3 0.4  PROT 6.9 7.3 6.7 6.5  ALBUMIN 2.5* 2.6* 2.4* 2.4*   No results found for this basename: LIPASE, AMYLASE,  in the  last 168 hours No results found for this basename: AMMONIA,  in the last 168 hours CBC:  Recent Labs Lab 01/23/13 1310 01/24/13 0520 01/25/13 0428 01/27/13 0618  WBC 4.7 4.9 4.2 4.0  NEUTROABS  --   --  2.1 2.5  HGB 13.9 11.3* 11.9* 10.8*  HCT 39.1 31.6* 34.1* 30.7*  MCV 86.1 86.8 87.9 86.5  PLT 229 231 234 228   Cardiac Enzymes: No results found for this basename: CKTOTAL, CKMB, CKMBINDEX, TROPONINI,  in the last 168 hours BNP: No components found with this basename: POCBNP,  CBG: No results found for this basename: GLUCAP,  in the last 168 hours  Recent Results (from the past 240 hour(s))  URINE CULTURE     Status: None   Collection Time    01/23/13  2:13 PM      Result Value Range Status   Specimen Description URINE, RANDOM   Final   Special Requests Immunocompromised   Final   Culture  Setup Time 01/23/2013 18:38   Final   Colony Count NO GROWTH   Final   Culture NO GROWTH   Final   Report Status 01/24/2013 FINAL   Final  MALARIA SMEAR     Status: None   Collection Time    01/23/13 11:57 PM      Result Value Range Status   Specimen Description Blood   Final   Special Requests Immunocompromised   Final   Malaria Prep     Final   Value: No Plasmodium or Other Blood Parasites Seen on Thick or Thin Smears For persons strongly suspected of having a blood parasite,but have negative smears, it is recommended that blood films be repeated approximately every 12 to 24 hours for 3 consecutive      days.   Report Status 01/25/2013 FINAL   Final  CULTURE, BLOOD (ROUTINE X 2)     Status: None   Collection Time    01/23/13 11:58 PM      Result Value Range Status   Specimen Description BLOOD RIGHT ARM   Final   Special Requests BOTTLES DRAWN AEROBIC ONLY 10CC   Final   Culture  Setup Time 01/24/2013 05:06   Final   Culture     Final   Value:        BLOOD CULTURE RECEIVED NO GROWTH TO DATE CULTURE WILL BE HELD FOR 5 DAYS BEFORE ISSUING A FINAL NEGATIVE REPORT   Report Status  PENDING   Incomplete  CULTURE, BLOOD (ROUTINE X 2)     Status: None   Collection Time    01/23/13 11:58 PM      Result Value Range Status   Specimen Description BLOOD LEFT ARM   Final   Special Requests BOTTLES DRAWN AEROBIC ONLY 10CC   Final   Culture  Setup Time 01/24/2013 05:06   Final   Culture     Final   Value: STAPHYLOCOCCUS SPECIES (COAGULASE NEGATIVE)     Note: THE SIGNIFICANCE OF ISOLATING THIS ORGANISM FROM A SINGLE SET OF BLOOD CULTURES WHEN MULTIPLE SETS ARE  DRAWN IS UNCERTAIN. PLEASE NOTIFY THE MICROBIOLOGY DEPARTMENT WITHIN ONE WEEK IF SPECIATION AND SENSITIVITIES ARE REQUIRED.     Note: Gram Stain Report Called to,Read Back By and Verified With: DALLAS HART AT 04:33 ON 01/25/2013   Report Status 01/26/2013 FINAL   Final  CULTURE, EXPECTORATED SPUTUM-ASSESSMENT     Status: None   Collection Time    01/24/13  7:53 AM      Result Value Range Status   Specimen Description SPUTUM   Final   Special Requests Immunocompromised   Final   Sputum evaluation     Final   Value: MICROSCOPIC FINDINGS SUGGEST THAT THIS SPECIMEN IS NOT REPRESENTATIVE OF LOWER RESPIRATORY SECRETIONS. PLEASE RECOLLECT.     Gram Stain Report Called to,Read Back By and Verified With: Pablo Lawrence RN 8:45 01/24/13 ([email or web address])   Report Status 01/24/2013 FINAL   Final  AFB CULTURE WITH SMEAR     Status: None   Collection Time    01/24/13  7:53 AM      Result Value Range Status   Specimen Description SPUTUM   Final   Special Requests ADD [id number]   Final   ACID FAST SMEAR NO ACID FAST BACILLI SEEN   Final   Culture     Final   Value: CULTURE WILL BE EXAMINED FOR 6 WEEKS BEFORE ISSUING A FINAL REPORT   Report Status PENDING   Incomplete  RESPIRATORY VIRUS PANEL     Status: None   Collection Time    01/24/13  8:18 AM      Result Value Range Status   Source - RVPAN     Corrected   Value: CORRECTED ON 06/26 AT 0848: PREVIOUSLY REPORTED AS NASAL SWAB   Comment: CORRECTED ON 06/27 AT 1808: PREVIOUSLY REPORTED AS  NASOPHARYNGEAL, CORRECTED ON 06/26 AT 0848: PREVIOUSLY REPORTED AS NASAL SWAB   Respiratory Syncytial Virus A NOT DETECTED   Final   Respiratory Syncytial Virus B NOT DETECTED   Final   Influenza A NOT DETECTED   Final   Influenza B NOT DETECTED   Final   Parainfluenza 1 NOT DETECTED   Final   Parainfluenza 2 NOT DETECTED   Final   Parainfluenza 3 NOT DETECTED   Final   Metapneumovirus NOT DETECTED   Final   Rhinovirus NOT DETECTED   Final   Adenovirus NOT DETECTED   Final   Influenza A H1 NOT DETECTED   Final   Influenza A H3 NOT DETECTED   Final   Comment: (NOTE)           Normal Reference Range for each Analyte: NOT DETECTED     Testing performed using the [employer] xTAG Respiratory Viral Panel test     kit.     This test was developed and its performance characteristics determined     by [employer]. It has not been cleared or approved by the [place]     Food and Drug Administration. This test is used for clinical purposes.     It should not be regarded as investigational or for research. This     laboratory is certified under the Clinical Laboratory Improvement     Amendments of 1988 (CLIA) as qualified to perform high complexity     clinical laboratory testing.  AFB CULTURE WITH SMEAR     Status: None   Collection Time    01/24/13  6:18 PM      Result Value Range Status   Specimen Description SPUTUM  Final   Special Requests Immunocompromised   Final   ACID FAST SMEAR NO ACID FAST BACILLI SEEN   Final   Culture     Final   Value: CULTURE WILL BE EXAMINED FOR 6 WEEKS BEFORE ISSUING A FINAL REPORT   Report Status PENDING   Incomplete  AFB CULTURE WITH SMEAR     Status: None   Collection Time    01/25/13  2:27 PM      Result Value Range Status   Specimen Description SPUTUM   Final   Special Requests Immunocompromised   Final   ACID FAST SMEAR NO ACID FAST BACILLI SEEN   Final   Culture     Final   Value: CULTURE WILL BE EXAMINED FOR 6 WEEKS BEFORE ISSUING A FINAL  REPORT   Report Status PENDING   Incomplete  CLOSTRIDIUM DIFFICILE BY PCR     Status: None   Collection Time    01/25/13  9:25 PM      Result Value Range Status   C difficile by pcr NEGATIVE  NEGATIVE Final     Scheduled Meds: . azithromycin  500 mg Intravenous Q24H  . cefTRIAXone (ROCEPHIN)  IV  1 g Intravenous Q24H  . enoxaparin (LOVENOX) injection  40 mg Subcutaneous Q24H  . feeding supplement  237 mL Oral BID BM  . guaiFENesin  600 mg Oral BID  . multivitamin with minerals  1 tablet Oral Daily  . sodium chloride  3 mL Intravenous Q12H   Continuous Infusions: . sodium chloride 1,000 mL (01/28/13 1121)     Maressa Apollo, DO  [facility] Pager [phone]  If 7PM-7AM, please contact night-coverage [email or web address] Password [email or web address] 01/28/2013, 2:39 PM   LOS: 5 days

## 2013-01-28 NOTE — Progress Notes (Signed)
SLP Note pt remains on airborne precautions.  MBS or esophagram are unable to be completed.  Please note evaluating SLP last week suspected probable esophageal issues.  As MBS can not be completed, SLP to sign off.  Please reorder if pt can be off airborne precautions and MD still desires MBS.  Thanks.  Alan Burnet, MS [facility] SLP [phone]

## 2013-01-28 NOTE — Progress Notes (Signed)
Pt's family member requesting to know time pt will be going down for thoracentesis.  Called [facility] again and instructed that still working with pt in ED.  Once finish will call and unsure at this time exact time.  Family member informed. Amanda Pea, Charity fundraiser.

## 2013-01-29 ENCOUNTER — Inpatient Hospital Stay (HOSPITAL_COMMUNITY): Payer: Medicare Other

## 2013-01-29 DIAGNOSIS — R918 Other nonspecific abnormal finding of lung field: Secondary | ICD-10-CM | POA: Diagnosis present

## 2013-01-29 DIAGNOSIS — R7611 Nonspecific reaction to tuberculin skin test without active tuberculosis: Secondary | ICD-10-CM

## 2013-01-29 DIAGNOSIS — R0602 Shortness of breath: Secondary | ICD-10-CM

## 2013-01-29 LAB — COMPREHENSIVE METABOLIC PANEL
ALT: 62 U/L — ABNORMAL HIGH (ref 0–53)
AST: 59 U/L — ABNORMAL HIGH (ref 0–37)
Albumin: 2.5 g/dL — ABNORMAL LOW (ref 3.5–5.2)
Alkaline Phosphatase: 74 U/L (ref 39–117)
BUN: 6 mg/dL (ref 6–23)
CO2: 25 mEq/L (ref 19–32)
Calcium: 8.1 mg/dL — ABNORMAL LOW (ref 8.4–10.5)
Chloride: 101 mEq/L (ref 96–112)
Creatinine, Ser: 0.95 mg/dL (ref 0.50–1.35)
GFR calc Af Amer: >90 mL/min (ref >90)
GFR calc non Af Amer: 84 mL/min — ABNORMAL LOW (ref >90)
Glucose, Bld: 98 mg/dL (ref 70–99)
Potassium: 3.8 mEq/L (ref 3.5–5.1)
Sodium: 134 mEq/L — ABNORMAL LOW (ref 135–145)
Total Bilirubin: 0.4 mg/dL (ref 0.3–1.2)
Total Protein: 7.1 g/dL (ref 6.0–8.3)

## 2013-01-29 LAB — HIV ANTIBODY (ROUTINE TESTING W REFLEX): HIV: NONREACTIVE

## 2013-01-29 MED ORDER — CEFTRIAXONE SODIUM 1 G IJ SOLR
1.0000 g | INTRAMUSCULAR | Status: DC
  Administered 2013-01-29: 1 g via INTRAVENOUS
  Filled 2013-01-29 (×2): qty 10

## 2013-01-29 MED ORDER — BOOST / RESOURCE BREEZE PO LIQD
1.0000 | Freq: Two times a day (BID) | ORAL | Status: DC
  Administered 2013-01-29 – 2013-02-02 (×5): 1 via ORAL

## 2013-01-29 MED ORDER — AZITHROMYCIN 500 MG PO TABS
500.0000 mg | ORAL_TABLET | Freq: Every day | ORAL | Status: DC
  Administered 2013-01-29 – 2013-01-30 (×2): 500 mg via ORAL
  Filled 2013-01-29 (×2): qty 1

## 2013-01-29 NOTE — Progress Notes (Signed)
Pt up to chair this afternoon HR went 140 and nonsustained.  Noted Pt had just got up to chair and asymptomatic.  Dr. Arbutus Leas made aware.  No new order will continue to monitor.

## 2013-01-29 NOTE — Progress Notes (Signed)
Patient ID: Alan Roman, male   DOB: 1946/03/20, 67 y.o.   MRN: [id number]         [facility] for Infectious Disease    Date of Admission:  01/23/2013     Off of antibiotics for 48 hours   Principal Problem:   Fever, unknown origin Active Problems:   Psoriasis   Immunocompromised state   Hyponatremia   Pleural effusion   Acute bronchiolitis due to other infectious organisms   Pulmonary infiltrate   . enoxaparin (LOVENOX) injection  40 mg Subcutaneous Q24H  . feeding supplement  1 Container Oral BID BM  . multivitamin with minerals  1 tablet Oral Daily  . sodium chloride  3 mL Intravenous Q12H    Subjective: He is feeling no better. He continues to have dry cough and shortness of breath. He is now had persistent fevers for 4 weeks. He believes he lost about 10 pounds unintentionally. He does not note any decrease in his fevers recently.  Review of Systems: Pertinent items are noted in HPI.  Past Medical History  Diagnosis Date  . Hypertension   . Arthritis     History  Substance Use Topics  . Smoking status: Never Smoker   . Smokeless tobacco: Never Used  . Alcohol Use: No    History reviewed. No pertinent family history.  No Known Allergies  Objective: Temp:  [98.1 F (36.7 C)-100.6 F (38.1 C)] 98.7 F (37.1 C) (07/01 1433) Pulse Rate:  [81-91] 86 (07/01 1433) Resp:  [18] 18 (07/01 1433) BP: (122-142)/(71-88) 124/71 mmHg (07/01 1433) SpO2:  [100 %] 100 % (07/01 1433) Weight:  [69.8 kg (153 lb 14.1 oz)] 69.8 kg (153 lb 14.1 oz) (07/01 1191)  General: He is in no distress visiting with family Skin: No rash Lymph nodes: No palpable adenopathy Lungs: Diminished breath sounds at bases Cor: Regular S1 and S2 no murmurs Abdomen: Soft and nontender  Lab Results Lab Results  Component Value Date   WBC 4.0 01/27/2013   HGB 10.8* 01/27/2013   HCT 30.7* 01/27/2013   MCV 86.5 01/27/2013   PLT 228 01/27/2013    Lab Results  Component Value Date   CREATININE 0.95 01/29/2013   BUN 6 01/29/2013   NA 134* 01/29/2013   K 3.8 01/29/2013   CL 101 01/29/2013   CO2 25 01/29/2013    Lab Results  Component Value Date   ALT 62* 01/29/2013   AST 59* 01/29/2013   ALKPHOS 74 01/29/2013   BILITOT 0.4 01/29/2013    Pleural fluid: Cloudy fluid was obtained. White blood cell count was 300 with 51% monocytes and elevated LDH and protein. No organisms were seen on Gram stain. AFB stain and cultures are pending   Microbiology: Recent Results (from the past 240 hour(s))  URINE CULTURE     Status: None   Collection Time    01/23/13  2:13 PM      Result Value Range Status   Specimen Description URINE, RANDOM   Final   Special Requests Immunocompromised   Final   Culture  Setup Time 01/23/2013 18:38   Final   Colony Count NO GROWTH   Final   Culture NO GROWTH   Final   Report Status 01/24/2013 FINAL   Final  MALARIA SMEAR     Status: None   Collection Time    01/23/13 11:57 PM      Result Value Range Status   Specimen Description Blood   Final   Special Requests  Immunocompromised   Final   Malaria Prep     Final   Value: No Plasmodium or Other Blood Parasites Seen on Thick or Thin Smears For persons strongly suspected of having a blood parasite,but have negative smears, it is recommended that blood films be repeated approximately every 12 to 24 hours for 3 consecutive      days.   Report Status 01/25/2013 FINAL   Final  CULTURE, BLOOD (ROUTINE X 2)     Status: None   Collection Time    01/23/13 11:58 PM      Result Value Range Status   Specimen Description BLOOD RIGHT ARM   Final   Special Requests BOTTLES DRAWN AEROBIC ONLY 10CC   Final   Culture  Setup Time 01/24/2013 05:06   Final   Culture     Final   Value:        BLOOD CULTURE RECEIVED NO GROWTH TO DATE CULTURE WILL BE HELD FOR 5 DAYS BEFORE ISSUING A FINAL NEGATIVE REPORT   Report Status PENDING   Incomplete  CULTURE, BLOOD (ROUTINE X 2)     Status: None   Collection Time    01/23/13 11:58 PM       Result Value Range Status   Specimen Description BLOOD LEFT ARM   Final   Special Requests BOTTLES DRAWN AEROBIC ONLY 10CC   Final   Culture  Setup Time 01/24/2013 05:06   Final   Culture     Final   Value: STAPHYLOCOCCUS SPECIES (COAGULASE NEGATIVE)     Note: THE SIGNIFICANCE OF ISOLATING THIS ORGANISM FROM A SINGLE SET OF BLOOD CULTURES WHEN MULTIPLE SETS ARE DRAWN IS UNCERTAIN. PLEASE NOTIFY THE MICROBIOLOGY DEPARTMENT WITHIN ONE WEEK IF SPECIATION AND SENSITIVITIES ARE REQUIRED.     Note: Gram Stain Report Called to,Read Back By and Verified With: DALLAS HART AT 04:33 ON 01/25/2013   Report Status 01/26/2013 FINAL   Final  CULTURE, EXPECTORATED SPUTUM-ASSESSMENT     Status: None   Collection Time    01/24/13  7:53 AM      Result Value Range Status   Specimen Description SPUTUM   Final   Special Requests Immunocompromised   Final   Sputum evaluation     Final   Value: MICROSCOPIC FINDINGS SUGGEST THAT THIS SPECIMEN IS NOT REPRESENTATIVE OF LOWER RESPIRATORY SECRETIONS. PLEASE RECOLLECT.     Gram Stain Report Called to,Read Back By and Verified With: Pablo Lawrence RN 8:45 01/24/13 ([email or web address])   Report Status 01/24/2013 FINAL   Final  AFB CULTURE WITH SMEAR     Status: None   Collection Time    01/24/13  7:53 AM      Result Value Range Status   Specimen Description SPUTUM   Final   Special Requests ADD [id number]   Final   ACID FAST SMEAR NO ACID FAST BACILLI SEEN   Final   Culture     Final   Value: CULTURE WILL BE EXAMINED FOR 6 WEEKS BEFORE ISSUING A FINAL REPORT   Report Status PENDING   Incomplete  RESPIRATORY VIRUS PANEL     Status: None   Collection Time    01/24/13  8:18 AM      Result Value Range Status   Source - RVPAN     Corrected   Value: CORRECTED ON 06/26 AT 0848: PREVIOUSLY REPORTED AS NASAL SWAB   Comment: CORRECTED ON 06/27 AT 1808: PREVIOUSLY REPORTED AS NASOPHARYNGEAL, CORRECTED ON 06/26 AT 0848: PREVIOUSLY REPORTED AS NASAL  SWAB   Respiratory Syncytial Virus A NOT  DETECTED   Final   Respiratory Syncytial Virus B NOT DETECTED   Final   Influenza A NOT DETECTED   Final   Influenza B NOT DETECTED   Final   Parainfluenza 1 NOT DETECTED   Final   Parainfluenza 2 NOT DETECTED   Final   Parainfluenza 3 NOT DETECTED   Final   Metapneumovirus NOT DETECTED   Final   Rhinovirus NOT DETECTED   Final   Adenovirus NOT DETECTED   Final   Influenza A H1 NOT DETECTED   Final   Influenza A H3 NOT DETECTED   Final   Comment: (NOTE)           Normal Reference Range for each Analyte: NOT DETECTED     Testing performed using the Luminex xTAG Respiratory Viral Panel test     kit.     This test was developed and its performance characteristics determined     by [employer]. It has not been cleared or approved by the [place]     Food and Drug Administration. This test is used for clinical purposes.     It should not be regarded as investigational or for research. This     laboratory is certified under the Clinical Laboratory Improvement     Amendments of 1988 (CLIA) as qualified to perform high complexity     clinical laboratory testing.  AFB CULTURE WITH SMEAR     Status: None   Collection Time    01/24/13  6:18 PM      Result Value Range Status   Specimen Description SPUTUM   Final   Special Requests Immunocompromised   Final   ACID FAST SMEAR NO ACID FAST BACILLI SEEN   Final   Culture     Final   Value: CULTURE WILL BE EXAMINED FOR 6 WEEKS BEFORE ISSUING A FINAL REPORT   Report Status PENDING   Incomplete  AFB CULTURE WITH SMEAR     Status: None   Collection Time    01/25/13  2:27 PM      Result Value Range Status   Specimen Description SPUTUM   Final   Special Requests Immunocompromised   Final   ACID FAST SMEAR NO ACID FAST BACILLI SEEN   Final   Culture     Final   Value: CULTURE WILL BE EXAMINED FOR 6 WEEKS BEFORE ISSUING A FINAL REPORT   Report Status PENDING   Incomplete  CLOSTRIDIUM DIFFICILE BY PCR     Status: None   Collection Time     01/25/13  9:25 PM      Result Value Range Status   C difficile by pcr NEGATIVE  NEGATIVE Final  AFB CULTURE WITH SMEAR     Status: None   Collection Time    01/28/13  3:28 PM      Result Value Range Status   Specimen Description PLEURAL FLUID RIGHT   Final   Special Requests FLUID   Final   ACID FAST SMEAR NO ACID FAST BACILLI SEEN   Final   Culture     Final   Value: CULTURE WILL BE EXAMINED FOR 6 WEEKS BEFORE ISSUING A FINAL REPORT   Report Status PENDING   Incomplete  BODY FLUID CULTURE     Status: None   Collection Time    01/28/13  3:28 PM      Result Value Range Status   Specimen Description  PLEURAL FLUID RIGHT   Final   Special Requests FLUID   Final   Gram Stain     Final   Value: NO WBC SEEN     NO ORGANISMS SEEN   Culture NO GROWTH   Final   Report Status PENDING   Incomplete  FUNGUS CULTURE W SMEAR     Status: None   Collection Time    01/28/13  3:28 PM      Result Value Range Status   Specimen Description FLUID PLEURAL RIGHT   Final   Special Requests 60CC   Final   Fungal Smear NO YEAST OR FUNGAL ELEMENTS SEEN   Final   Culture CULTURE IN PROGRESS FOR FOUR WEEKS   Final   Report Status PENDING   Incomplete    Studies/Results: Dg Chest 1 View  01/28/2013   *RADIOLOGY REPORT*  Clinical Data: Status post right thoracentesis  CHEST - 1 VIEW  Comparison: CT chest dated 01/25/2013  Findings: No pneumothorax is seen status post right thoracentesis. Small right pleural effusion, improved.  Stable mild elevation of the right hemidiaphragm.  The heart is normal in size.  IMPRESSION: No pneumothorax status post right thoracentesis.  Small right pleural effusion, improved.   Original Report Authenticated By: Charline Bills, M.D.   Dg Chest 2 View  01/29/2013   *RADIOLOGY REPORT*  Clinical Data: Right lower lobe infiltrate with weakness cough and congestion.  History of hypertension.  Nonsmoker  CHEST - 2 VIEW  Comparison: 06/30 and 01/12/2013  Findings: A small  right pleural effusion is again noted.  Heart and mediastinal contours are stable. Anterior eventration of the right hemidiaphragm is seen with some resultant associated basilar atelectasis.  No significant change in the appearance of the right lower lobe is otherwise identified in comparison with prior exam. No new focal infiltrates or signs of congestive failure are seen. No left pleural fluid is identified.  Bony structures are unchanged.  IMPRESSION: Small right pleural effusion, right basilar atelectasis and unchanged right basilar density.   Original Report Authenticated By: Rhodia Albright, M.D.   US Thoracentesis Asp Pleural Space W/img Guide  01/28/2013   *RADIOLOGY REPORT*  Clinical Data:  Right pleural effusion  ULTRASOUND GUIDED right THORACENTESIS  Comparison:  None  An ultrasound guided thoracentesis was thoroughly discussed with the patient and questions answered.  The benefits, risks, alternatives and complications were also discussed.  The patient understands and wishes to proceed with the procedure.  Written consent was obtained.  Ultrasound was performed to localize and mark an adequate pocket of fluid in the right chest.  The area was then prepped and draped in the normal sterile fashion.  1% Lidocaine was used for local anesthesia.  Under ultrasound guidance a 19 gauge Yueh catheter was introduced.  Thoracentesis was performed.  The catheter was removed and a dressing applied.  Complications:  None  Findings: A total of approximately 170 ml of cloudy yellow fluid was removed. A fluid sample was sent for laboratory analysis.  IMPRESSION: Successful ultrasound guided right thoracentesis yielding 170 ml of pleural fluid.  Read by: Ralene Muskrat, P.A.-C   Original Report Authenticated By: Tacey Ruiz, MD    Assessment: Seems as though his antibiotics were inadvertently stopped 48 hours ago. Although he does not note any improvement it does appear that his temperature curve has been  improving. Not sure that this is simple bacterial community-acquired pneumonia but I will restart his antibiotics while waiting on further pleural fluid  results.  His interferon gamma release assay for latent tuberculosis is positive. These assays are not affected by previous BCG vaccine. Certainly, TB pleuritis could be the cause of his FUO. I will see if we can add additional testing to his pleural fluid including PCR for Mycobacterium tuberculosis, adenosine deaminase, and interferon gamma release assay. I will hold off on empiric TB therapy for now but we'll need to reevaluate this option day by day.  Plan: 1. Restart ceftriaxone and azithromycin and monitor temperature curve 2. Await pleural fluid AFB stain and all cultures 3. Add M. TB PCR, adenosine deaminase and interferon gamma release assay on pleural fluid  Cliffton Asters, MD [facility] [phone] pager   [phone] cell 01/29/2013, 5:05 PM

## 2013-01-29 NOTE — Progress Notes (Signed)
NUTRITION FOLLOW UP  DOCUMENTATION CODES  Per approved criteria   -Severe malnutrition in the context of acute illness or injury    Intervention:   1. Discontinue Ensure Complete po BID. 2. Add Resource Breeze po BID, each supplement provides 250 kcal and 9 grams of protein. 3. Allow family to bring foods to help promote additional variety given limited vegetarian diet choices. 4. RD to continue to follow nutrition care plan.  Nutrition Dx:   Inadequate oral intake related to poor appetite as evidenced by dietary recall and ongoing weight loss. Improving.  Goal:   Intake to meet >90% of estimated nutrition needs. Improved.  Monitor:   weight trends, lab trends, I/O's, PO intake, supplement tolerance  Assessment:   PMHx significant for psoriasis - pt on chronic Humera. Pt went to PCP with fevers and scratchy throat. Tested for TB - had positive skin test 22 BCG vaccine and negative CXR x 2. Pt prescribed Amoxicillin and then Bactrim, however had no relief. Admitted to [facility] and transferred to [facility] for further work-up.  Pt with large R pleural effusion and thoracic adenopathy. Thoracentesis completed 6/30.  Pt coughing with all food/drink. BSE completed by SLP on 6/28, SLP recommends Regular diet with thin liquids. Also recommends GI consult for esophageal reflux. MBSS cannot be completed at this time as pt is on airborne precautions.  Having diarrhea - c diff negative. Malaria smear - negative. Quantiferon TB - positive.  Continues on Vegetarian diet. Pt and daughter report that his intake is improving. Pt prefers Resource Breeze to Ensure Complete - RD to change order. Daughter states that family is bringing in some additional vegetarian sources to help with patient's appetite.  Pt meets criteria for severe MALNUTRITION in the context of acute illness as evidenced by 5% wt loss x 3 weeks and intake of <50% of estimated energy needs x at least 5 days.  Height: Ht Readings from  Last 1 Encounters:  01/23/13 5\' 7"  (1.702 m)    Weight Status:   Wt Readings from Last 1 Encounters:  01/29/13 153 lb 14.1 oz (69.8 kg)  Admit wt 152 lb - stable.  Re-estimated needs:  Kcal: 1650 - 1850 Protein: 70 - 83 g Fluid: 1.7 - 2 liters  Skin:  Back incision  Diet Order: Vegetarian   Intake/Output Summary (Last 24 hours) at 01/29/13 0845 Last data filed at 01/29/13 0626  Gross per 24 hour  Intake   4800 ml  Output   2250 ml  Net   2550 ml    Last BM: 6/30 (diarrhea)   Labs:   Recent Labs Lab 01/27/13 0618 01/28/13 0545 01/29/13 0521  NA 132* 135 134*  K 3.6 3.6 3.8  CL 101 101 101  CO2 23 25 25   BUN 6 6 6   CREATININE 0.85 0.93 0.95  CALCIUM 7.5* 7.8* 8.1*  GLUCOSE 99 93 98   Prealbumin  Date/Time Value Range Status  01/25/2013  4:28 AM 10.0* 17.0 - 34.0 mg/dL Final    CBG (last 3)  No results found for this basename: GLUCAP,  in the last 72 hours  Scheduled Meds: . enoxaparin (LOVENOX) injection  40 mg Subcutaneous Q24H  . feeding supplement  237 mL Oral BID BM  . multivitamin with minerals  1 tablet Oral Daily  . sodium chloride  3 mL Intravenous Q12H    Continuous Infusions: . sodium chloride 75 mL/hr at 01/29/13 0022    Jarold Motto MS, RD, LDN Pager: [phone] After-hours pager:  [phone]     

## 2013-01-29 NOTE — Progress Notes (Addendum)
Active problems: Febrile illness of uncertain etiology R pleural effusion Immunosuppressed with Humara Positive quant gold assay  Studies/Events: Thoracentesis 6/30: exudative chemistries (prot 4.9, LDH 233), monocyte predominance (51 mons, 39 lymphs, 10 PMNs)  Consults:  ID   Subj: No new complaints. No distress. T max 100.6  Obj: Filed Vitals:   01/29/13 1131  BP: 127/78  Pulse: 87  Temp: 98.1 F (36.7 C)  Resp:     Gen: NAD HEENT: WNL Neck: no JVD, no LAN Chest: diminished in RLL, no bronchophony or other adventitious sounds Cardiac: RRR [id number], NT, NABS Ext: warm, no edema  BMET    Component Value Date/Time   NA 134* 01/29/2013 0521   K 3.8 01/29/2013 0521   CL 101 01/29/2013 0521   CO2 25 01/29/2013 0521   GLUCOSE 98 01/29/2013 0521   BUN 6 01/29/2013 0521   CREATININE 0.95 01/29/2013 0521   CALCIUM 8.1* 01/29/2013 0521   GFRNONAA 84* 01/29/2013 0521   GFRAA >90 01/29/2013 0521    CBC    Component Value Date/Time   WBC 4.0 01/27/2013 0618   RBC 3.55* 01/27/2013 0618   HGB 10.8* 01/27/2013 0618   HCT 30.7* 01/27/2013 0618   PLT 228 01/27/2013 0618   MCV 86.5 01/27/2013 0618   MCH 30.4 01/27/2013 0618   MCHC 35.2 01/27/2013 0618   RDW 12.0 01/27/2013 0618   LYMPHSABS 0.6* 01/27/2013 0618   MONOABS 0.8 01/27/2013 0618   EOSABS 0.1 01/27/2013 0618   BASOSABS 0.0 01/27/2013 0618    CXR: 6/30 post thora: effusion gone. Subtle RLL AS dz partially obscured by dome of R diaphragm   IMPRESSION: Fever curve improving Immunosuppressed RLL AS dz c/w PNA Exudative R effusion, s/p thoracentesis Quant gold positive - unclear significance Shotty hilar LAN on previous CT chest - doubt malignant   PLAN/RECS:  Discussed with Dr Arbutus Leas and Dr Orvan Falconer 2 v CXR ordered for more complete characterization of RLL infiltrate Will hold off on FOB unless directed otherwise by Dr Orvan Falconer Abx per ID Suggest Rx with antibacterials directed @ PNA Will sign off. Please call if we can be of  further assistance   [name], MD ; [facility] [phone].  After 5:30 PM or weekends, call [phone]

## 2013-01-29 NOTE — Progress Notes (Signed)
Utilization Review Completed Deserae Jennings J. Chea Malan, RN, BSN, NCM [phone]  

## 2013-01-29 NOTE — Progress Notes (Signed)
Dr. Arbutus Leas informed that pt's daughter wanted to know if they will be doing Modify barium swallow and that I spoke with ST and instructed that MD will had to place another order to have it done if needed.  Pt and daughter made aware.  Amanda Pea, Charity fundraiser.

## 2013-01-29 NOTE — Progress Notes (Signed)
Pt 's daughter requesting to have pt go for barium swallow study and in formed T. Kimball,SP who instructed that she had signed off due to pt on Airborne precaution at this time.  If need  MD to reorder swallowing test.  Amanda Pea, RN.

## 2013-01-29 NOTE — Progress Notes (Addendum)
[facility] PROGRESS NOTE  Alan Roman WUJ:[id number] DOB: Jun 08, 1946 DOA: 01/23/2013 PCP: Pcp Not In System Brief history  67 year old gentleman with a history of psoriasis diagnosed 10 years ago, previously on prednisone, methotrexate, and Enbrel x 5 years. Most recently, the patient has been placed on a Humira for the past 2 years. His last dose was on 12/24/2012. The patient presented with 3 weeks of intermittent fevers ranging from 100.0-101.64F with a maximum of 102.64F associated with chills. The patient also endorsed a dry nonproductive cough and intermittent occipital headache. The patient was treated with amoxicillin by his primary care physician 2 weeks ago for bronchitis. He completed a course of antibiotics. He went to see another physician who diagnosed him with a UTI and started him on Bactrim. The patient continued to have fevers and fatigue. He also endorses a 7 pound weight loss in the past 3 weeks. The patient had 2 visits [place], most recently returned end of March 2014. He did not take malaria prophylaxis, but he stayed in screened rooms with airconditioning. No sick contacts. He drank bottled water. He was admitted to [facility] on 01/21/2013. He was started on ceftriaxone and acyclovir. His labs at [facility] show WBC 5.4, Hb 11.8, platelets 247. His differential showed slightly increased monocytes with 23.4 %. He also had hyponatremia of 128 in consecutive BMPs there. His ECHO did not show any evidence of vegetations but he had thickened MV with mild MR. His monospot test was negative.  He was transferred to [facility] on 01/23/13 do to FUO. Since admission, the patient has continued to have intermittent fevers. Infectious disease was consulted. Initial chest x-ray revealed right basilar density with parapneumonic effusion. The patient was started on ceftriaxone and azithromycin. His fevers persisted. Sputum AFB smears were negative x3 The patient had abnormal CT of the chest with "tree in  bud" opacity in RUL. Pulmonology was consulted. They recommended thoracocentesis. CT of the abdomen showed interstitial thickening of the right upper quadrant intraperitoneal space there was nonspecific. Thoracocentesis was performed on 01/28/2013. Fluid is exudative.  Assessment/Plan: FUO  -Working diagnosis is pneumonia suspect atypical  -off ceftriaxone/azithro--last dose 01/27/13 -restart today per ID -QuantiFERON-TB-->positive  -followup AFB sputum--neg x3  -follow up malaria smear--neg  -fever curve is trending down -repiratory viral panel neg  -Last dose of Humira was 12/24/12  -CT chest--"tree in bud" opacity RUL, L-subpleural nodules, scattered mediastinal LN, right pleural effusion  -appreciate Pulmonary consultation  -Stool for C. difficile PCR--neg  -CT abdomen and pelvis--no gallstones or gallbladder wall thickening. Interstitial thickening in the right upper quadrant intraperitoneal space, small ascites  -thoracocentesis (01/28/13)- 170cc cloudy fluid  -ordered pleural fluid for silver stain, AFB culture/strain, routine Gram stain/culture, cytology, cell count, LDH, protein, glucose  -Preliminary bacterial, AFB, and fungal cultures on pleural fluid negative  -HIV serology--neg -MTB PCR ordered on pleural fluid Pleural effusion  -Exudative by light criteria  -340 WBCs--90% monocytes  -Await culture data  Bacteremia  -Likely to be contaminant  -d/c vancomycin  Pneumonia  -01/23/2013 chest x-ray shows right basilar opacity with parapneumonic effusion  Transaminasemia  -stable  -likely due to ceftriaxone and azithromycin  -continue to monitor  Hyponatremia  -Likely volume depletion, poor solute intake  -Start normal saline-->improving  -Check TSH 2.384  Hypertension  -controlled  -hold amlodipine, blood pressure soft  weight loss, generalized weakness  -Physical therapy  -Check prealbumin--10.0  -Start nutritional supplement  Family Communication: Daughter at  beside  Disposition Plan: Home when medically stable  Antibiotics:  Ceftriaxone 01/23/2013>>> 01/27/13----restart 01/29/13 Azithromycin 01/23/2013>>>01/27/13----restart 01/29/13 Procedures:  --Thoracocentesis--01/28/13  Family Communication:   daughter at beside Disposition Plan:   Home when medically stable     Procedures/Studies: Dg Chest 1 View  01/28/2013   *RADIOLOGY REPORT*  Clinical Data: Status post right thoracentesis  CHEST - 1 VIEW  Comparison: CT chest dated 01/25/2013  Findings: No pneumothorax is seen status post right thoracentesis. Small right pleural effusion, improved.  Stable mild elevation of the right hemidiaphragm.  The heart is normal in size.  IMPRESSION: No pneumothorax status post right thoracentesis.  Small right pleural effusion, improved.   Original Report Authenticated By: Charline Bills, M.D.   Dg Chest 2 View  01/29/2013   *RADIOLOGY REPORT*  Clinical Data: Right lower lobe infiltrate with weakness cough and congestion.  History of hypertension.  Nonsmoker  CHEST - 2 VIEW  Comparison: 06/30 and 01/12/2013  Findings: A small right pleural effusion is again noted.  Heart and mediastinal contours are stable. Anterior eventration of the right hemidiaphragm is seen with some resultant associated basilar atelectasis.  No significant change in the appearance of the right lower lobe is otherwise identified in comparison with prior exam. No new focal infiltrates or signs of congestive failure are seen. No left pleural fluid is identified.  Bony structures are unchanged.  IMPRESSION: Small right pleural effusion, right basilar atelectasis and unchanged right basilar density.   Original Report Authenticated By: Rhodia Albright, M.D.   Dg Chest 2 View  01/23/2013   *RADIOLOGY REPORT*  Clinical Data: Fever.  Cough.  CHEST - 2 VIEW  Comparison: None.  Findings: Probable small right subpulmonic pleural effusion. Blunting of right costophrenic angle is present.  Right basilar volume  loss/atelectasis.  Patchy airspace density extends to the right costophrenic angle suspicious for pneumonia.  Unchanged elevation of the right hemidiaphragm with right basilar atelectasis.  Small sclerotic focus of the posterior aspect of the T5 vertebra of these probably unchanged compared to 05/29/2012 allowing for differences in projection.  This may be associated with rib trauma or costovertebral osteoarthritis.  Follow-up to ensure radiographic clearing recommended.  Clearing is usually observed at 8 weeks.  IMPRESSION: Right basilar/right lower lobe airspace disease with probable small parapneumonic effusion.  Findings suspicious for pneumonia.   Original Report Authenticated By: Andreas Newport, M.D.   Ct Chest Wo Contrast  01/25/2013   *RADIOLOGY REPORT*  Clinical Data: Cough.  Shortness of breath.  Chest pain.  Probable pneumonia on recent imaging.  Immunocompromised patient.  CT CHEST WITHOUT CONTRAST  Technique:  Multidetector CT imaging of the chest was performed following the standard protocol without IV contrast.  Comparison: No prior chest CT.  Two-view chest x-ray 01/23/2013.  Findings: Respiratory motion blurred many of the images.  Large right pleural effusion with associated passive atelectasis in the right lower lobe.  No confluent airspace consolidation elsewhere. Tree in bud opacities in the inferior right upper lobe.  2 mm subpleural nodule in the inferior left upper lobe (series 3, image 24) and polygonal subpleural nodule in the superior segment left lower lobe adjacent to the fissure (image 20).  No significant pulmonary parenchymal nodules.  No left pleural effusion.  Central airways patent without significant bronchial wall thickening.  Heart size normal.  Prominent epicardial fat and prominent paracardiac fat pads.  Mild to moderate LAD coronary atherosclerosis.  No pericardial effusion.  Mild atherosclerosis involving the thoracic and upper abdominal aorta without aneurysm.  Numerous  mediastinal lymph nodes which are borderline in size,  the largest a station 4R node at the carina measuring approximately 1.5 x 1.2 cm.  No nodal masses.  Thyroid gland normal in appearance.  Very small hiatal hernia.  Inflammatory changes in the upper abdomen in the midline and right upper quadrant, incompletely imaged.  Bone window images demonstrate diffuse thoracic spondylosis.  IMPRESSION:  1.  Large right pleural effusion and associated passive atelectasis in the right lower lobe. 2.  Tree in bud opacities in the inferior right upper lobe which are consistent with infection/alveolitis. 3.  Inflammatory changes involving the upper abdomen in the midline and right upper quadrant.  These findings are incompletely imaged. Dedicated CT abdomen and pelvis may be helpful for further characterization. 4.  Scattered borderline mediastinal lymph nodes, likely reactive. 5.  Very small hiatal hernia.   Original Report Authenticated By: Hulan Saas, M.D.   Ct Abdomen Pelvis W Contrast  01/26/2013   *RADIOLOGY REPORT*  Clinical Data: Right upper quadrant pain and midline inflammatory process.  Hypertension and arthritis.  Fever of unknown origin. Immunocompromised state.  CT ABDOMEN AND PELVIS WITH CONTRAST  Technique:  Multidetector CT imaging of the abdomen and pelvis was performed following the standard protocol during bolus administration of intravenous contrast.  Contrast: OMNIPAQUE IOHEXOL 300 MG/ML  SOLN  Comparison: 03/08/2007 abdominal pelvic CT.  Chest CT of 1 day prior  Findings: Lung bases:  Clear lung bases.  1.4 cm azygo-esophageal recess node.  Normal heart size with coronary artery atherosclerosis.  A small to moderate right pleural effusion is not significantly changed. Bilateral cardiophrenic angle nodes which are mildly enlarged.  Largest is on the left at 8 mm.  Abdomen/pelvis:  Normal appearance of the liver.  Old granulomas disease in the spleen.  Normal stomach, pancreas.  The gallbladder  is incompletely distended.  No calcified stones or wall thickening. No biliary ductal dilatation.  Normal adrenal glands and kidneys.  Double IVC. No retroperitoneal or retrocrural adenopathy.  Scattered colonic diverticula.  Normal terminal ileum and appendix.  Normal small bowel caliber.  Small volume ascites is identified, including within both pericolic gutters and in the pelvic cul-de- sac.  There has been interval ventral abdominal wall hernia repair since 03/08/2007.  Interstitial thickening is identified within the right upper quadrant and extending into the anterior right abdomen. No definite localizing source is seen.  No well-defined peritoneal mass.  No inflamed adjacent bowel loops.  There are mildly prominent collateral veins in this region including on image 25/series 2.  No pelvic adenopathy.    Normal urinary bladder and prostate.  Bones/Musculoskeletal:  No acute osseous abnormality.  IMPRESSION:  1.  Similar moderate right-sided pleural effusion with lower thoracic adenopathy, indeterminate. 2.  Edema/interstitial thickening within the intraperitoneal space of the right upper quadrant.  No definite localizing source is seen.  There has been prior ventral hernia repair and there are prominent collateral veins in this region.  Question whether this interstitial thickening is postoperative.  Depending on the right upper quadrant symptomatology, potential clinical strategies of ultrasound to evaluate the gallbladder versus follow-up CT to confirm stability or resolution (i.e.  4 - 6 weeks). 3.  Small volume concurrent abdominal pelvic ascites, indeterminate etiology. 4.  Double IVC.   Original Report Authenticated By: Jeronimo Greaves, M.D.   US Thoracentesis Asp Pleural Space W/img Guide  01/28/2013   *RADIOLOGY REPORT*  Clinical Data:  Right pleural effusion  ULTRASOUND GUIDED right THORACENTESIS  Comparison:  None  An ultrasound guided thoracentesis was thoroughly discussed with  the patient and  questions answered.  The benefits, risks, alternatives and complications were also discussed.  The patient understands and wishes to proceed with the procedure.  Written consent was obtained.  Ultrasound was performed to localize and mark an adequate pocket of fluid in the right chest.  The area was then prepped and draped in the normal sterile fashion.  1% Lidocaine was used for local anesthesia.  Under ultrasound guidance a 19 gauge Yueh catheter was introduced.  Thoracentesis was performed.  The catheter was removed and a dressing applied.  Complications:  None  Findings: A total of approximately 170 ml of cloudy yellow fluid was removed. A fluid sample was sent for laboratory analysis.  IMPRESSION: Successful ultrasound guided right thoracentesis yielding 170 ml of pleural fluid.  Read by: Ralene Muskrat, P.A.-C   Original Report Authenticated By: Tacey Ruiz, MD         Subjective:  Patient denies any dizziness, headache, chest pain, shortness breath, nausea, vomiting, diarrhea, abdominal pain, dysuria, hematuria, rash.  Objective: Filed Vitals:   01/29/13 0623 01/29/13 1131 01/29/13 1433 01/29/13 1715  BP: 122/80 127/78 124/71   Pulse: 81 87 86   Temp: 98.3 F (36.8 C) 98.1 F (36.7 C) 98.7 F (37.1 C) 97.6 F (36.4 C)  TempSrc: Oral Oral Oral   Resp: 18  18   Height:      Weight: 69.8 kg (153 lb 14.1 oz)     SpO2: 100%  100%     Intake/Output Summary (Last 24 hours) at 01/29/13 1734 Last data filed at 01/29/13 1435  Gross per 24 hour  Intake   4443 ml  Output   2600 ml  Net   1843 ml   Weight change: -0.7 kg (-1 lb 8.7 oz) Exam:   General:  Pt is alert, follows commands appropriately, not in acute distress  HEENT: No icterus, No thrush, No neck mass, [place]/AT  Cardiovascular: RRR, S1/S2, no rubs, no gallops  Respiratory: right basilar crackles. No wheezes. Good air movement.   Abdomen: Soft/+BS, non tender, non distended, no guarding  Extremities: No edema, No  lymphangitis, No petechiae, No rashes, no synovitis  Data Reviewed: Basic Metabolic Panel:  Recent Labs Lab 01/25/13 0428 01/26/13 0435 01/27/13 0618 01/28/13 0545 01/29/13 0521  NA 134* 133* 132* 135 134*  K 4.1 3.7 3.6 3.6 3.8  CL 101 99 101 101 101  CO2 24 24 23 25 25   GLUCOSE 91 91 99 93 98  BUN 10 8 6 6 6   CREATININE 0.99 0.93 0.85 0.93 0.95  CALCIUM 8.2* 8.0* 7.5* 7.8* 8.1*   Liver Function Tests:  Recent Labs Lab 01/24/13 0520 01/26/13 0435 01/27/13 0618 01/28/13 0545 01/29/13 0521  AST 39* 60* 66* 54* 59*  ALT 28 53 60* 55* 62*  ALKPHOS 66 76 73 70 74  BILITOT 0.5 0.4 0.3 0.4 0.4  PROT 6.9 7.3 6.7 6.5 7.1  ALBUMIN 2.5* 2.6* 2.4* 2.4* 2.5*   No results found for this basename: LIPASE, AMYLASE,  in the last 168 hours No results found for this basename: AMMONIA,  in the last 168 hours CBC:  Recent Labs Lab 01/23/13 1310 01/24/13 0520 01/25/13 0428 01/27/13 0618  WBC 4.7 4.9 4.2 4.0  NEUTROABS  --   --  2.1 2.5  HGB 13.9 11.3* 11.9* 10.8*  HCT 39.1 31.6* 34.1* 30.7*  MCV 86.1 86.8 87.9 86.5  PLT 229 231 234 228   Cardiac Enzymes: No results found for this  basename: CKTOTAL, CKMB, CKMBINDEX, TROPONINI,  in the last 168 hours BNP: No components found with this basename: POCBNP,  CBG: No results found for this basename: GLUCAP,  in the last 168 hours  Recent Results (from the past 240 hour(s))  URINE CULTURE     Status: None   Collection Time    01/23/13  2:13 PM      Result Value Range Status   Specimen Description URINE, RANDOM   Final   Special Requests Immunocompromised   Final   Culture  Setup Time 01/23/2013 18:38   Final   Colony Count NO GROWTH   Final   Culture NO GROWTH   Final   Report Status 01/24/2013 FINAL   Final  MALARIA SMEAR     Status: None   Collection Time    01/23/13 11:57 PM      Result Value Range Status   Specimen Description Blood   Final   Special Requests Immunocompromised   Final   Malaria Prep     Final    Value: No Plasmodium or Other Blood Parasites Seen on Thick or Thin Smears For persons strongly suspected of having a blood parasite,but have negative smears, it is recommended that blood films be repeated approximately every 12 to 24 hours for 3 consecutive      days.   Report Status 01/25/2013 FINAL   Final  CULTURE, BLOOD (ROUTINE X 2)     Status: None   Collection Time    01/23/13 11:58 PM      Result Value Range Status   Specimen Description BLOOD RIGHT ARM   Final   Special Requests BOTTLES DRAWN AEROBIC ONLY 10CC   Final   Culture  Setup Time 01/24/2013 05:06   Final   Culture     Final   Value:        BLOOD CULTURE RECEIVED NO GROWTH TO DATE CULTURE WILL BE HELD FOR 5 DAYS BEFORE ISSUING A FINAL NEGATIVE REPORT   Report Status PENDING   Incomplete  CULTURE, BLOOD (ROUTINE X 2)     Status: None   Collection Time    01/23/13 11:58 PM      Result Value Range Status   Specimen Description BLOOD LEFT ARM   Final   Special Requests BOTTLES DRAWN AEROBIC ONLY 10CC   Final   Culture  Setup Time 01/24/2013 05:06   Final   Culture     Final   Value: STAPHYLOCOCCUS SPECIES (COAGULASE NEGATIVE)     Note: THE SIGNIFICANCE OF ISOLATING THIS ORGANISM FROM A SINGLE SET OF BLOOD CULTURES WHEN MULTIPLE SETS ARE DRAWN IS UNCERTAIN. PLEASE NOTIFY THE MICROBIOLOGY DEPARTMENT WITHIN ONE WEEK IF SPECIATION AND SENSITIVITIES ARE REQUIRED.     Note: Gram Stain Report Called to,Read Back By and Verified With: DALLAS HART AT 04:33 ON 01/25/2013   Report Status 01/26/2013 FINAL   Final  CULTURE, EXPECTORATED SPUTUM-ASSESSMENT     Status: None   Collection Time    01/24/13  7:53 AM      Result Value Range Status   Specimen Description SPUTUM   Final   Special Requests Immunocompromised   Final   Sputum evaluation     Final   Value: MICROSCOPIC FINDINGS SUGGEST THAT THIS SPECIMEN IS NOT REPRESENTATIVE OF LOWER RESPIRATORY SECRETIONS. PLEASE RECOLLECT.     Gram Stain Report Called to,Read Back By and  Verified With: Pablo Lawrence RN 8:45 01/24/13 ([email or web address])   Report Status 01/24/2013 FINAL   Final  AFB CULTURE WITH SMEAR     Status: None   Collection Time    01/24/13  7:53 AM      Result Value Range Status   Specimen Description SPUTUM   Final   Special Requests ADD [id number]   Final   ACID FAST SMEAR NO ACID FAST BACILLI SEEN   Final   Culture     Final   Value: CULTURE WILL BE EXAMINED FOR 6 WEEKS BEFORE ISSUING A FINAL REPORT   Report Status PENDING   Incomplete  RESPIRATORY VIRUS PANEL     Status: None   Collection Time    01/24/13  8:18 AM      Result Value Range Status   Source - RVPAN     Corrected   Value: CORRECTED ON 06/26 AT 0848: PREVIOUSLY REPORTED AS NASAL SWAB   Comment: CORRECTED ON 06/27 AT 1808: PREVIOUSLY REPORTED AS NASOPHARYNGEAL, CORRECTED ON 06/26 AT 0848: PREVIOUSLY REPORTED AS NASAL SWAB   Respiratory Syncytial Virus A NOT DETECTED   Final   Respiratory Syncytial Virus B NOT DETECTED   Final   Influenza A NOT DETECTED   Final   Influenza B NOT DETECTED   Final   Parainfluenza 1 NOT DETECTED   Final   Parainfluenza 2 NOT DETECTED   Final   Parainfluenza 3 NOT DETECTED   Final   Metapneumovirus NOT DETECTED   Final   Rhinovirus NOT DETECTED   Final   Adenovirus NOT DETECTED   Final   Influenza A H1 NOT DETECTED   Final   Influenza A H3 NOT DETECTED   Final   Comment: (NOTE)           Normal Reference Range for each Analyte: NOT DETECTED     Testing performed using the [employer] xTAG Respiratory Viral Panel test     kit.     This test was developed and its performance characteristics determined     by [employer]. It has not been cleared or approved by the [place]     Food and Drug Administration. This test is used for clinical purposes.     It should not be regarded as investigational or for research. This     laboratory is certified under the Clinical Laboratory Improvement     Amendments of 1988 (CLIA) as qualified to perform high complexity      clinical laboratory testing.  AFB CULTURE WITH SMEAR     Status: None   Collection Time    01/24/13  6:18 PM      Result Value Range Status   Specimen Description SPUTUM   Final   Special Requests Immunocompromised   Final   ACID FAST SMEAR NO ACID FAST BACILLI SEEN   Final   Culture     Final   Value: CULTURE WILL BE EXAMINED FOR 6 WEEKS BEFORE ISSUING A FINAL REPORT   Report Status PENDING   Incomplete  AFB CULTURE WITH SMEAR     Status: None   Collection Time    01/25/13  2:27 PM      Result Value Range Status   Specimen Description SPUTUM   Final   Special Requests Immunocompromised   Final   ACID FAST SMEAR NO ACID FAST BACILLI SEEN   Final   Culture     Final   Value: CULTURE WILL BE EXAMINED FOR 6 WEEKS BEFORE ISSUING A FINAL REPORT   Report Status PENDING   Incomplete  CLOSTRIDIUM DIFFICILE BY  PCR     Status: None   Collection Time    01/25/13  9:25 PM      Result Value Range Status   C difficile by pcr NEGATIVE  NEGATIVE Final  AFB CULTURE WITH SMEAR     Status: None   Collection Time    01/28/13  3:28 PM      Result Value Range Status   Specimen Description PLEURAL FLUID RIGHT   Final   Special Requests FLUID   Final   ACID FAST SMEAR NO ACID FAST BACILLI SEEN   Final   Culture     Final   Value: CULTURE WILL BE EXAMINED FOR 6 WEEKS BEFORE ISSUING A FINAL REPORT   Report Status PENDING   Incomplete  BODY FLUID CULTURE     Status: None   Collection Time    01/28/13  3:28 PM      Result Value Range Status   Specimen Description PLEURAL FLUID RIGHT   Final   Special Requests FLUID   Final   Gram Stain     Final   Value: NO WBC SEEN     NO ORGANISMS SEEN   Culture NO GROWTH   Final   Report Status PENDING   Incomplete  FUNGUS CULTURE W SMEAR     Status: None   Collection Time    01/28/13  3:28 PM      Result Value Range Status   Specimen Description FLUID PLEURAL RIGHT   Final   Special Requests 60CC   Final   Fungal Smear NO YEAST OR FUNGAL  ELEMENTS SEEN   Final   Culture CULTURE IN PROGRESS FOR FOUR WEEKS   Final   Report Status PENDING   Incomplete     Scheduled Meds: . enoxaparin (LOVENOX) injection  40 mg Subcutaneous Q24H  . feeding supplement  1 Container Oral BID BM  . multivitamin with minerals  1 tablet Oral Daily  . sodium chloride  3 mL Intravenous Q12H   Continuous Infusions: . sodium chloride 1,000 mL (01/29/13 1316)     Quinesha Selinger, DO  [facility] Pager [phone]  If 7PM-7AM, please contact night-coverage [email or web address] Password [email or web address] 01/29/2013, 5:34 PM   LOS: 6 days

## 2013-01-30 ENCOUNTER — Ambulatory Visit: Payer: Medicare Other | Admitting: Neurology

## 2013-01-30 ENCOUNTER — Inpatient Hospital Stay (HOSPITAL_COMMUNITY): Payer: Medicare Other

## 2013-01-30 LAB — CBC WITH DIFFERENTIAL/PLATELET
Basophils Absolute: 0 10*3/uL (ref 0.0–0.1)
Basophils Relative: 0 % (ref 0–1)
Eosinophils Absolute: 0.2 10*3/uL (ref 0.0–0.7)
Eosinophils Relative: 4 % (ref 0–5)
HCT: 31.5 % — ABNORMAL LOW (ref 39.0–52.0)
Hemoglobin: 11.2 g/dL — ABNORMAL LOW (ref 13.0–17.0)
Lymphocytes Relative: 19 % (ref 12–46)
Lymphs Abs: 0.9 10*3/uL (ref 0.7–4.0)
MCH: 30.9 pg (ref 26.0–34.0)
MCHC: 35.6 g/dL (ref 30.0–36.0)
MCV: 87 fL (ref 78.0–100.0)
Monocytes Absolute: 0.9 10*3/uL (ref 0.1–1.0)
Monocytes Relative: 19 % — ABNORMAL HIGH (ref 3–12)
Neutro Abs: 2.8 10*3/uL (ref 1.7–7.7)
Neutrophils Relative %: 57 % (ref 43–77)
Platelets: 218 10*3/uL (ref 150–400)
RBC: 3.62 MIL/uL — ABNORMAL LOW (ref 4.22–5.81)
RDW: 12.5 % (ref 11.5–15.5)
WBC: 4.9 10*3/uL (ref 4.0–10.5)

## 2013-01-30 LAB — CULTURE, BLOOD (ROUTINE X 2): Culture: NO GROWTH

## 2013-01-30 LAB — BASIC METABOLIC PANEL
BUN: 7 mg/dL (ref 6–23)
CO2: 22 mEq/L (ref 19–32)
Calcium: 7.9 mg/dL — ABNORMAL LOW (ref 8.4–10.5)
Chloride: 102 mEq/L (ref 96–112)
Creatinine, Ser: 0.83 mg/dL (ref 0.50–1.35)
GFR calc Af Amer: >90 mL/min (ref >90)
GFR calc non Af Amer: 88 mL/min — ABNORMAL LOW (ref >90)
Glucose, Bld: 97 mg/dL (ref 70–99)
Potassium: 3.6 mEq/L (ref 3.5–5.1)
Sodium: 133 mEq/L — ABNORMAL LOW (ref 135–145)

## 2013-01-30 NOTE — Procedures (Signed)
Objective Swallowing Evaluation: Modified Barium Swallowing Study  Patient Details  Name: Alan Roman MRN: [id number] Date of Birth: August 05, 1945  Today's Date: 01/30/2013 Time: 0950-1005 SLP Time Calculation (min): 15 min  Past Medical History:  Past Medical History  Diagnosis Date  . Hypertension   . Arthritis    Past Surgical History:  Past Surgical History  Procedure Laterality Date  . Hernia repair     HPI:  67 y.o. male admitted with persistent fever s/p recurrent ABX. CXR revealed right basilar density suspicious of pna. Pt. had been on Humira for psoriasis. PMH + HTN, arthritis, and reports occassioanl reflux for  which he takes OTC antacids. According to pt, he has had hiatal hernia repair. Pt and daughter report excessive belching after PO. Pt denies h/o CVA, PD, neuro changes.  BSE suspious for esophageal impairments.  MD wanted to proceed with MBS.     Assessment / Plan / Recommendation Clinical Impression  Dysphagia Diagnosis: Within Functional Limits Clinical impression: Pt.s oropharyngeal swallow function was WFL's.  Swallow initiation timely, no pharyngeal residual and normal UES function.  Pill appeared to hesitate briefly at LES prior to entering stomach (no radiologist present to confirm).  Loud belching following study 2-3 times.  Recommend continue regular texture diet and thin liquids, straws ok, pill whole in applesauce, remain sitting upright for 45 minutes after meals.  MD could consider further esophageal workup if appropriate.       Treatment Recommendation  No treatment recommended at this time    Diet Recommendation Regular;Thin liquid   Liquid Administration via: Cup;Straw Medication Administration: Whole meds with liquid Supervision: Patient able to self feed Compensations: Small sips/bites Postural Changes and/or Swallow Maneuvers: Upright 30-60 min after meal;Seated upright 90 degrees    Other  Recommendations Recommended Consults: Consider GI  evaluation Oral Care Recommendations: Oral care BID   Follow Up Recommendations  None    Frequency and Duration        Pertinent Vitals/Pain none    SLP Swallow Goals Patient will consume recommended diet without observed clinical signs of aspiration with: Independent assistance Swallow Study Goal #1 - Progress: Met      Reason for Referral Objectively evaluate swallowing function   Oral Phase Oral Preparation/Oral Phase Oral Phase: WFL   Pharyngeal Phase Pharyngeal Phase Pharyngeal Phase: Within functional limits  Cervical Esophageal Phase    GO  Breck Coons Clifford M.Ed CCC-SLP Pager [phone]  01/30/2013    Cervical Esophageal Phase Cervical Esophageal Phase: [facility]

## 2013-01-30 NOTE — Progress Notes (Signed)
[facility] PROGRESS NOTE  Alan Roman AVW:[id number] DOB: Sep 05, 1945 DOA: 01/23/2013 PCP: Pcp Not In System Brief history  67 year old gentleman with a history of psoriasis diagnosed 10 years ago, previously on prednisone, methotrexate, and Enbrel x 5 years. Most recently, the patient has been placed on a Humira for the past 2 years. His last dose was on 12/24/2012. The patient presented with 3 weeks of intermittent fevers ranging from 100.0-101.37F with a maximum of 102.37F associated with chills. The patient also endorsed a dry nonproductive cough and intermittent occipital headache. The patient was treated with amoxicillin by his primary care physician 2 weeks ago for bronchitis. He completed a course of antibiotics. He went to see another physician who diagnosed him with a UTI and started him on Bactrim. The patient continued to have fevers and fatigue. He also endorses a 7 pound weight loss in the past 3 weeks. The patient had 2 visits [place], most recently returned end of March 2014. He did not take malaria prophylaxis, but he stayed in screened rooms with airconditioning. No sick contacts. He drank bottled water. He was admitted to [facility] on 01/21/2013. He was started on ceftriaxone and acyclovir. His labs at [facility] show WBC 5.4, Hb 11.8, platelets 247. His differential showed slightly increased monocytes with 23.4 %. He also had hyponatremia of 128 in consecutive BMPs there. His ECHO did not show any evidence of vegetations but he had thickened MV with mild MR. His monospot test was negative.  He was transferred to [facility] on 01/23/13 do to FUO. Since admission, the patient has continued to have intermittent fevers. Infectious disease was consulted. Initial chest x-ray revealed right basilar density with parapneumonic effusion. The patient was started on ceftriaxone and azithromycin. His fevers persisted. Sputum AFB smears were negative x3 The patient had abnormal CT of the chest with "tree in  bud" opacity in RUL. Pulmonology was consulted. They recommended thoracocentesis. CT of the abdomen showed interstitial thickening of the right upper quadrant intraperitoneal space there was nonspecific. Thoracocentesis was performed on 01/28/2013. Fluid is exudative.  Assessment/Plan: FUO  -Working diagnosis is TB vs pneumonia suspect atypical  -off ceftriaxone/azithro--last dose 01/27/13 -restart yesterday per ID -QuantiFERON-TB-->positive  -followup AFB sputum--neg x3  -follow up malaria smear--neg  -fever curve is fluctuating. -repiratory viral panel neg  -Last dose of Humira was 12/24/12  -CT chest--"tree in bud" opacity RUL, L-subpleural nodules, scattered mediastinal LN, right pleural effusion  -appreciate Pulmonary consultation  -Stool for C. difficile PCR--neg  -CT abdomen and pelvis--no gallstones or gallbladder wall thickening. Interstitial thickening in the right upper quadrant intraperitoneal space, small ascites  -thoracocentesis (01/28/13)- 170cc cloudy fluid  -ordered pleural fluid for silver stain, AFB culture/strain, routine Gram stain/culture, cytology, cell count, LDH, protein, glucose  -Preliminary bacterial, AFB, and fungal cultures on pleural fluid negative  -HIV serology--neg -MTB PCR ordered on pleural fluid  Rocephin and azithromycin stopped per ID today. Patient for bronchoscopy tomorrow per pulm. Patient to be started on 4 drug TB therapy per ID after broncoscopy tomorrow. Pleural effusion  -Exudative by light criteria  -340 WBCs--90% monocytes  -Await culture data  Bacteremia  -Likely to be contaminant  -d/c vancomycin  Pneumonia  -01/23/2013 chest x-ray shows right basilar opacity with parapneumonic effusion  Transaminasemia  -stable  -likely due to ceftriaxone and azithromycin  -continue to monitor  Hyponatremia  -Likely volume depletion, poor solute intake  -Start normal saline-->improving  -Check TSH 2.384  Hypertension  -controlled  -hold  amlodipine, blood pressure soft  weight loss, generalized weakness  -Physical therapy  -Check prealbumin--10.0  -Start nutritional supplement  Family Communication: Daughter at beside  Disposition Plan: Home when medically stable  Antibiotics:  Ceftriaxone 01/23/2013>>> 01/27/13----restart 01/29/13--->01/30/13 Azithromycin 01/23/2013>>>01/27/13----restart 01/29/13----> 01/30/13 Procedures:  --Thoracocentesis--01/28/13  Family Communication:   daughter at beside Disposition Plan:   Home when medically stable     Procedures/Studies: Dg Chest 1 View  01/28/2013   *RADIOLOGY REPORT*  Clinical Data: Status post right thoracentesis  CHEST - 1 VIEW  Comparison: CT chest dated 01/25/2013  Findings: No pneumothorax is seen status post right thoracentesis. Small right pleural effusion, improved.  Stable mild elevation of the right hemidiaphragm.  The heart is normal in size.  IMPRESSION: No pneumothorax status post right thoracentesis.  Small right pleural effusion, improved.   Original Report Authenticated By: Charline Bills, M.D.   Dg Chest 2 View  01/29/2013   *RADIOLOGY REPORT*  Clinical Data: Right lower lobe infiltrate with weakness cough and congestion.  History of hypertension.  Nonsmoker  CHEST - 2 VIEW  Comparison: 06/30 and 01/12/2013  Findings: A small right pleural effusion is again noted.  Heart and mediastinal contours are stable. Anterior eventration of the right hemidiaphragm is seen with some resultant associated basilar atelectasis.  No significant change in the appearance of the right lower lobe is otherwise identified in comparison with prior exam. No new focal infiltrates or signs of congestive failure are seen. No left pleural fluid is identified.  Bony structures are unchanged.  IMPRESSION: Small right pleural effusion, right basilar atelectasis and unchanged right basilar density.   Original Report Authenticated By: Rhodia Albright, M.D.   Dg Chest 2 View  01/23/2013   *RADIOLOGY  REPORT*  Clinical Data: Fever.  Cough.  CHEST - 2 VIEW  Comparison: None.  Findings: Probable small right subpulmonic pleural effusion. Blunting of right costophrenic angle is present.  Right basilar volume loss/atelectasis.  Patchy airspace density extends to the right costophrenic angle suspicious for pneumonia.  Unchanged elevation of the right hemidiaphragm with right basilar atelectasis.  Small sclerotic focus of the posterior aspect of the T5 vertebra of these probably unchanged compared to 05/29/2012 allowing for differences in projection.  This may be associated with rib trauma or costovertebral osteoarthritis.  Follow-up to ensure radiographic clearing recommended.  Clearing is usually observed at 8 weeks.  IMPRESSION: Right basilar/right lower lobe airspace disease with probable small parapneumonic effusion.  Findings suspicious for pneumonia.   Original Report Authenticated By: Andreas Newport, M.D.   Ct Chest Wo Contrast  01/25/2013   *RADIOLOGY REPORT*  Clinical Data: Cough.  Shortness of breath.  Chest pain.  Probable pneumonia on recent imaging.  Immunocompromised patient.  CT CHEST WITHOUT CONTRAST  Technique:  Multidetector CT imaging of the chest was performed following the standard protocol without IV contrast.  Comparison: No prior chest CT.  Two-view chest x-ray 01/23/2013.  Findings: Respiratory motion blurred many of the images.  Large right pleural effusion with associated passive atelectasis in the right lower lobe.  No confluent airspace consolidation elsewhere. Tree in bud opacities in the inferior right upper lobe.  2 mm subpleural nodule in the inferior left upper lobe (series 3, image 24) and polygonal subpleural nodule in the superior segment left lower lobe adjacent to the fissure (image 20).  No significant pulmonary parenchymal nodules.  No left pleural effusion.  Central airways patent without significant bronchial wall thickening.  Heart size normal.  Prominent epicardial fat  and prominent paracardiac fat pads.  Mild  to moderate LAD coronary atherosclerosis.  No pericardial effusion.  Mild atherosclerosis involving the thoracic and upper abdominal aorta without aneurysm.  Numerous mediastinal lymph nodes which are borderline in size, the largest a station 4R node at the carina measuring approximately 1.5 x 1.2 cm.  No nodal masses.  Thyroid gland normal in appearance.  Very small hiatal hernia.  Inflammatory changes in the upper abdomen in the midline and right upper quadrant, incompletely imaged.  Bone window images demonstrate diffuse thoracic spondylosis.  IMPRESSION:  1.  Large right pleural effusion and associated passive atelectasis in the right lower lobe. 2.  Tree in bud opacities in the inferior right upper lobe which are consistent with infection/alveolitis. 3.  Inflammatory changes involving the upper abdomen in the midline and right upper quadrant.  These findings are incompletely imaged. Dedicated CT abdomen and pelvis may be helpful for further characterization. 4.  Scattered borderline mediastinal lymph nodes, likely reactive. 5.  Very small hiatal hernia.   Original Report Authenticated By: Hulan Saas, M.D.   Ct Abdomen Pelvis W Contrast  01/26/2013   *RADIOLOGY REPORT*  Clinical Data: Right upper quadrant pain and midline inflammatory process.  Hypertension and arthritis.  Fever of unknown origin. Immunocompromised state.  CT ABDOMEN AND PELVIS WITH CONTRAST  Technique:  Multidetector CT imaging of the abdomen and pelvis was performed following the standard protocol during bolus administration of intravenous contrast.  Contrast: OMNIPAQUE IOHEXOL 300 MG/ML  SOLN  Comparison: 03/08/2007 abdominal pelvic CT.  Chest CT of 1 day prior  Findings: Lung bases:  Clear lung bases.  1.4 cm azygo-esophageal recess node.  Normal heart size with coronary artery atherosclerosis.  A small to moderate right pleural effusion is not significantly changed. Bilateral  cardiophrenic angle nodes which are mildly enlarged.  Largest is on the left at 8 mm.  Abdomen/pelvis:  Normal appearance of the liver.  Old granulomas disease in the spleen.  Normal stomach, pancreas.  The gallbladder is incompletely distended.  No calcified stones or wall thickening. No biliary ductal dilatation.  Normal adrenal glands and kidneys.  Double IVC. No retroperitoneal or retrocrural adenopathy.  Scattered colonic diverticula.  Normal terminal ileum and appendix.  Normal small bowel caliber.  Small volume ascites is identified, including within both pericolic gutters and in the pelvic cul-de- sac.  There has been interval ventral abdominal wall hernia repair since 03/08/2007.  Interstitial thickening is identified within the right upper quadrant and extending into the anterior right abdomen. No definite localizing source is seen.  No well-defined peritoneal mass.  No inflamed adjacent bowel loops.  There are mildly prominent collateral veins in this region including on image 25/series 2.  No pelvic adenopathy.    Normal urinary bladder and prostate.  Bones/Musculoskeletal:  No acute osseous abnormality.  IMPRESSION:  1.  Similar moderate right-sided pleural effusion with lower thoracic adenopathy, indeterminate. 2.  Edema/interstitial thickening within the intraperitoneal space of the right upper quadrant.  No definite localizing source is seen.  There has been prior ventral hernia repair and there are prominent collateral veins in this region.  Question whether this interstitial thickening is postoperative.  Depending on the right upper quadrant symptomatology, potential clinical strategies of ultrasound to evaluate the gallbladder versus follow-up CT to confirm stability or resolution (i.e.  4 - 6 weeks). 3.  Small volume concurrent abdominal pelvic ascites, indeterminate etiology. 4.  Double IVC.   Original Report Authenticated By: Jeronimo Greaves, M.D.   US Thoracentesis Asp Pleural Space W/img  Guide  01/28/2013   *RADIOLOGY REPORT*  Clinical Data:  Right pleural effusion  ULTRASOUND GUIDED right THORACENTESIS  Comparison:  None  An ultrasound guided thoracentesis was thoroughly discussed with the patient and questions answered.  The benefits, risks, alternatives and complications were also discussed.  The patient understands and wishes to proceed with the procedure.  Written consent was obtained.  Ultrasound was performed to localize and mark an adequate pocket of fluid in the right chest.  The area was then prepped and draped in the normal sterile fashion.  1% Lidocaine was used for local anesthesia.  Under ultrasound guidance a 19 gauge Yueh catheter was introduced.  Thoracentesis was performed.  The catheter was removed and a dressing applied.  Complications:  None  Findings: A total of approximately 170 ml of cloudy yellow fluid was removed. A fluid sample was sent for laboratory analysis.  IMPRESSION: Successful ultrasound guided right thoracentesis yielding 170 ml of pleural fluid.  Read by: Ralene Muskrat, P.A.-C   Original Report Authenticated By: Tacey Ruiz, MD         Subjective:  Patient states slightly better than yesterday. Patient with fever last night and this morning.  Objective: Filed Vitals:   01/30/13 0026 01/30/13 0211 01/30/13 0557 01/30/13 1127  BP:   132/82 130/84  Pulse:   83 93  Temp: 100 F (37.8 C) 98.3 F (36.8 C) 98.3 F (36.8 C) 100 F (37.8 C)  TempSrc: Oral Oral Oral Oral  Resp:   19   Height:      Weight:   69.99 kg (154 lb 4.8 oz)   SpO2:   100% 97%    Intake/Output Summary (Last 24 hours) at 01/30/13 1300 Last data filed at 01/30/13 0700  Gross per 24 hour  Intake   3188 ml  Output   1525 ml  Net   1663 ml   Weight change: 0.19 kg (6.7 oz) Exam:   General:  Pt is alert, follows commands appropriately, not in acute distress  HEENT: No icterus, No thrush, No neck mass, [place]/AT  Cardiovascular: RRR, S1/S2, no rubs, no  gallops  Respiratory: right basilar crackles. No wheezes. Good air movement.   Abdomen: Soft/+BS, non tender, non distended, no guarding  Extremities: No edema, No lymphangitis, No petechiae, No rashes, no synovitis  Data Reviewed: Basic Metabolic Panel:  Recent Labs Lab 01/26/13 0435 01/27/13 0618 01/28/13 0545 01/29/13 0521 01/30/13 0521  NA 133* 132* 135 134* 133*  K 3.7 3.6 3.6 3.8 3.6  CL 99 101 101 101 102  CO2 24 23 25 25 22   GLUCOSE 91 99 93 98 97  BUN 8 6 6 6 7   CREATININE 0.93 0.85 0.93 0.95 0.83  CALCIUM 8.0* 7.5* 7.8* 8.1* 7.9*   Liver Function Tests:  Recent Labs Lab 01/24/13 0520 01/26/13 0435 01/27/13 0618 01/28/13 0545 01/29/13 0521  AST 39* 60* 66* 54* 59*  ALT 28 53 60* 55* 62*  ALKPHOS 66 76 73 70 74  BILITOT 0.5 0.4 0.3 0.4 0.4  PROT 6.9 7.3 6.7 6.5 7.1  ALBUMIN 2.5* 2.6* 2.4* 2.4* 2.5*   No results found for this basename: LIPASE, AMYLASE,  in the last 168 hours No results found for this basename: AMMONIA,  in the last 168 hours CBC:  Recent Labs Lab 01/23/13 1310 01/24/13 0520 01/25/13 0428 01/27/13 0618 01/30/13 0521  WBC 4.7 4.9 4.2 4.0 4.9  NEUTROABS  --   --  2.1 2.5 2.8  HGB 13.9  11.3* 11.9* 10.8* 11.2*  HCT 39.1 31.6* 34.1* 30.7* 31.5*  MCV 86.1 86.8 87.9 86.5 87.0  PLT 229 231 234 228 218   Cardiac Enzymes: No results found for this basename: CKTOTAL, CKMB, CKMBINDEX, TROPONINI,  in the last 168 hours BNP: No components found with this basename: POCBNP,  CBG: No results found for this basename: GLUCAP,  in the last 168 hours  Recent Results (from the past 240 hour(s))  URINE CULTURE     Status: None   Collection Time    01/23/13  2:13 PM      Result Value Range Status   Specimen Description URINE, RANDOM   Final   Special Requests Immunocompromised   Final   Culture  Setup Time 01/23/2013 18:38   Final   Colony Count NO GROWTH   Final   Culture NO GROWTH   Final   Report Status 01/24/2013 FINAL   Final   MALARIA SMEAR     Status: None   Collection Time    01/23/13 11:57 PM      Result Value Range Status   Specimen Description Blood   Final   Special Requests Immunocompromised   Final   Malaria Prep     Final   Value: No Plasmodium or Other Blood Parasites Seen on Thick or Thin Smears For persons strongly suspected of having a blood parasite,but have negative smears, it is recommended that blood films be repeated approximately every 12 to 24 hours for 3 consecutive      days.   Report Status 01/25/2013 FINAL   Final  CULTURE, BLOOD (ROUTINE X 2)     Status: None   Collection Time    01/23/13 11:58 PM      Result Value Range Status   Specimen Description BLOOD RIGHT ARM   Final   Special Requests BOTTLES DRAWN AEROBIC ONLY 10CC   Final   Culture  Setup Time 01/24/2013 05:06   Final   Culture NO GROWTH 5 DAYS   Final   Report Status 01/30/2013 FINAL   Final  CULTURE, BLOOD (ROUTINE X 2)     Status: None   Collection Time    01/23/13 11:58 PM      Result Value Range Status   Specimen Description BLOOD LEFT ARM   Final   Special Requests BOTTLES DRAWN AEROBIC ONLY 10CC   Final   Culture  Setup Time 01/24/2013 05:06   Final   Culture     Final   Value: STAPHYLOCOCCUS SPECIES (COAGULASE NEGATIVE)     Note: THE SIGNIFICANCE OF ISOLATING THIS ORGANISM FROM A SINGLE SET OF BLOOD CULTURES WHEN MULTIPLE SETS ARE DRAWN IS UNCERTAIN. PLEASE NOTIFY THE MICROBIOLOGY DEPARTMENT WITHIN ONE WEEK IF SPECIATION AND SENSITIVITIES ARE REQUIRED.     Note: Gram Stain Report Called to,Read Back By and Verified With: DALLAS HART AT 04:33 ON 01/25/2013   Report Status 01/26/2013 FINAL   Final  CULTURE, EXPECTORATED SPUTUM-ASSESSMENT     Status: None   Collection Time    01/24/13  7:53 AM      Result Value Range Status   Specimen Description SPUTUM   Final   Special Requests Immunocompromised   Final   Sputum evaluation     Final   Value: MICROSCOPIC FINDINGS SUGGEST THAT THIS SPECIMEN IS NOT  REPRESENTATIVE OF LOWER RESPIRATORY SECRETIONS. PLEASE RECOLLECT.     Gram Stain Report Called to,Read Back By and Verified With: Pablo Lawrence RN 8:45 01/24/13 ([email or web address])   Report  Status 01/24/2013 FINAL   Final  AFB CULTURE WITH SMEAR     Status: None   Collection Time    01/24/13  7:53 AM      Result Value Range Status   Specimen Description SPUTUM   Final   Special Requests ADD [id number]   Final   ACID FAST SMEAR NO ACID FAST BACILLI SEEN   Final   Culture     Final   Value: CULTURE WILL BE EXAMINED FOR 6 WEEKS BEFORE ISSUING A FINAL REPORT   Report Status PENDING   Incomplete  RESPIRATORY VIRUS PANEL     Status: None   Collection Time    01/24/13  8:18 AM      Result Value Range Status   Source - RVPAN     Corrected   Value: CORRECTED ON 06/26 AT 0848: PREVIOUSLY REPORTED AS NASAL SWAB   Comment: CORRECTED ON 06/27 AT 1808: PREVIOUSLY REPORTED AS NASOPHARYNGEAL, CORRECTED ON 06/26 AT 0848: PREVIOUSLY REPORTED AS NASAL SWAB   Respiratory Syncytial Virus A NOT DETECTED   Final   Respiratory Syncytial Virus B NOT DETECTED   Final   Influenza A NOT DETECTED   Final   Influenza B NOT DETECTED   Final   Parainfluenza 1 NOT DETECTED   Final   Parainfluenza 2 NOT DETECTED   Final   Parainfluenza 3 NOT DETECTED   Final   Metapneumovirus NOT DETECTED   Final   Rhinovirus NOT DETECTED   Final   Adenovirus NOT DETECTED   Final   Influenza A H1 NOT DETECTED   Final   Influenza A H3 NOT DETECTED   Final   Comment: (NOTE)           Normal Reference Range for each Analyte: NOT DETECTED     Testing performed using the [employer] xTAG Respiratory Viral Panel test     kit.     This test was developed and its performance characteristics determined     by [employer]. It has not been cleared or approved by the [place]     Food and Drug Administration. This test is used for clinical purposes.     It should not be regarded as investigational or for research. This     laboratory is certified under  the Clinical Laboratory Improvement     Amendments of 1988 (CLIA) as qualified to perform high complexity     clinical laboratory testing.  AFB CULTURE WITH SMEAR     Status: None   Collection Time    01/24/13  6:18 PM      Result Value Range Status   Specimen Description SPUTUM   Final   Special Requests Immunocompromised   Final   ACID FAST SMEAR NO ACID FAST BACILLI SEEN   Final   Culture     Final   Value: CULTURE WILL BE EXAMINED FOR 6 WEEKS BEFORE ISSUING A FINAL REPORT   Report Status PENDING   Incomplete  AFB CULTURE WITH SMEAR     Status: None   Collection Time    01/25/13  2:27 PM      Result Value Range Status   Specimen Description SPUTUM   Final   Special Requests Immunocompromised   Final   ACID FAST SMEAR NO ACID FAST BACILLI SEEN   Final   Culture     Final   Value: CULTURE WILL BE EXAMINED FOR 6 WEEKS BEFORE ISSUING A FINAL REPORT   Report Status PENDING  Incomplete  CLOSTRIDIUM DIFFICILE BY PCR     Status: None   Collection Time    01/25/13  9:25 PM      Result Value Range Status   C difficile by pcr NEGATIVE  NEGATIVE Final  AFB CULTURE WITH SMEAR     Status: None   Collection Time    01/28/13  3:28 PM      Result Value Range Status   Specimen Description PLEURAL FLUID RIGHT   Final   Special Requests FLUID   Final   ACID FAST SMEAR NO ACID FAST BACILLI SEEN   Final   Culture     Final   Value: CULTURE WILL BE EXAMINED FOR 6 WEEKS BEFORE ISSUING A FINAL REPORT   Report Status PENDING   Incomplete  BODY FLUID CULTURE     Status: None   Collection Time    01/28/13  3:28 PM      Result Value Range Status   Specimen Description PLEURAL FLUID RIGHT   Final   Special Requests FLUID   Final   Gram Stain     Final   Value: NO WBC SEEN     NO ORGANISMS SEEN   Culture NO GROWTH 1 DAY   Final   Report Status PENDING   Incomplete  FUNGUS CULTURE W SMEAR     Status: None   Collection Time    01/28/13  3:28 PM      Result Value Range Status    Specimen Description FLUID PLEURAL RIGHT   Final   Special Requests 60CC   Final   Fungal Smear NO YEAST OR FUNGAL ELEMENTS SEEN   Final   Culture CULTURE IN PROGRESS FOR FOUR WEEKS   Final   Report Status PENDING   Incomplete     Scheduled Meds: . azithromycin  500 mg Oral Daily  . cefTRIAXone (ROCEPHIN)  IV  1 g Intravenous Q24H  . enoxaparin (LOVENOX) injection  40 mg Subcutaneous Q24H  . feeding supplement  1 Container Oral BID BM  . multivitamin with minerals  1 tablet Oral Daily  . sodium chloride  3 mL Intravenous Q12H   Continuous Infusions: . sodium chloride 75 mL/hr at 01/30/13 Eddie Candle, MD  [facility] Pager [phone]  If 7PM-7AM, please contact night-coverage [email or web address] Password [email or web address] 01/30/2013, 1:00 PM   LOS: 7 days

## 2013-01-30 NOTE — Progress Notes (Signed)
Patient TMAX 38.0 oral.  Tylenol given x1.  Patient resting comfortably at bedside.  RN will continue to monitor. Louretta Parma, RN

## 2013-01-30 NOTE — Progress Notes (Signed)
Discussed with Dr Orvan Falconer. His plan to treat for TB presumptively are noted. We agree that it is reasonable to obtain BAL of RLL in hopes of getting a positive and definitive culture. Scheduled for 2PM 01/31/13.    [name], MD ; [facility] [phone].  After 5:30 PM or weekends, call [phone]

## 2013-01-30 NOTE — Progress Notes (Signed)
Patient ID: Alan Roman, male   DOB: 1946/01/18, 67 y.o.   MRN: [id number]         [facility]    Date of Admission:  01/23/2013           Day 7 ceftriaxone        Day 7 azithromycin  Principal Problem:   Fever, unknown origin Active Problems:   Psoriasis   Immunocompromised state   Hyponatremia   Pleural effusion   Acute bronchiolitis due to other infectious organisms   Pulmonary infiltrate   Subjective: He notes some improved her shortness of breath and cough since his thoracentesis but does not think the pattern of his fevers has changed.  Past Medical History  Diagnosis Date  . Hypertension   . Arthritis     History  Substance Use Topics  . Smoking status: Never Smoker   . Smokeless tobacco: Never Used  . Alcohol Use: No    History reviewed. No pertinent family history.  No Known Allergies  Objective: Temp:  [97.6 F (36.4 C)-101.1 F (38.4 C)] 97.9 F (36.6 C) (07/02 1300) Pulse Rate:  [83-93] 83 (07/02 1300) Resp:  [18-19] 18 (07/02 1300) BP: (111-132)/(71-84) 111/76 mmHg (07/02 1300) SpO2:  [97 %-100 %] 97 % (07/02 1300) Weight:  [69.99 kg (154 lb 4.8 oz)] 69.99 kg (154 lb 4.8 oz) (07/02 0557)  General: He is alert, sitting on the side of the bed and in no distress Skin: No rash Lungs: Clear Cor: Regular S1 and S2 no murmurs Abdomen: Nontender   Lab Results Lab Results  Component Value Date   WBC 4.9 01/30/2013   HGB 11.2* 01/30/2013   HCT 31.5* 01/30/2013   MCV 87.0 01/30/2013   PLT 218 01/30/2013    Microbiology: Recent Results (from the past 240 hour(s))  URINE CULTURE     Status: None   Collection Time    01/23/13  2:13 PM      Result Value Range Status   Specimen Description URINE, RANDOM   Final   Special Requests Immunocompromised   Final   Culture  Setup Time 01/23/2013 18:38   Final   Colony Count NO GROWTH   Final   Culture NO GROWTH   Final   Report Status 01/24/2013 FINAL   Final  MALARIA SMEAR      Status: None   Collection Time    01/23/13 11:57 PM      Result Value Range Status   Specimen Description Blood   Final   Special Requests Immunocompromised   Final   Malaria Prep     Final   Value: No Plasmodium or Other Blood Parasites Seen on Thick or Thin Smears For persons strongly suspected of having a blood parasite,but have negative smears, it is recommended that blood films be repeated approximately every 12 to 24 hours for 3 consecutive      days.   Report Status 01/25/2013 FINAL   Final  CULTURE, BLOOD (ROUTINE X 2)     Status: None   Collection Time    01/23/13 11:58 PM      Result Value Range Status   Specimen Description BLOOD RIGHT ARM   Final   Special Requests BOTTLES DRAWN AEROBIC ONLY 10CC   Final   Culture  Setup Time 01/24/2013 05:06   Final   Culture NO GROWTH 5 DAYS   Final   Report Status 01/30/2013 FINAL   Final  CULTURE, BLOOD (ROUTINE X 2)  Status: None   Collection Time    01/23/13 11:58 PM      Result Value Range Status   Specimen Description BLOOD LEFT ARM   Final   Special Requests BOTTLES DRAWN AEROBIC ONLY 10CC   Final   Culture  Setup Time 01/24/2013 05:06   Final   Culture     Final   Value: STAPHYLOCOCCUS SPECIES (COAGULASE NEGATIVE)     Note: THE SIGNIFICANCE OF ISOLATING THIS ORGANISM FROM A SINGLE SET OF BLOOD CULTURES WHEN MULTIPLE SETS ARE DRAWN IS UNCERTAIN. PLEASE NOTIFY THE MICROBIOLOGY DEPARTMENT WITHIN ONE WEEK IF SPECIATION AND SENSITIVITIES ARE REQUIRED.     Note: Gram Stain Report Called to,Read Back By and Verified With: DALLAS HART AT 04:33 ON 01/25/2013   Report Status 01/26/2013 FINAL   Final  CULTURE, EXPECTORATED SPUTUM-ASSESSMENT     Status: None   Collection Time    01/24/13  7:53 AM      Result Value Range Status   Specimen Description SPUTUM   Final   Special Requests Immunocompromised   Final   Sputum evaluation     Final   Value: MICROSCOPIC FINDINGS SUGGEST THAT THIS SPECIMEN IS NOT REPRESENTATIVE OF LOWER  RESPIRATORY SECRETIONS. PLEASE RECOLLECT.     Gram Stain Report Called to,Read Back By and Verified With: Pablo Lawrence RN 8:45 01/24/13 ([email or web address])   Report Status 01/24/2013 FINAL   Final  AFB CULTURE WITH SMEAR     Status: None   Collection Time    01/24/13  7:53 AM      Result Value Range Status   Specimen Description SPUTUM   Final   Special Requests ADD [id number]   Final   ACID FAST SMEAR NO ACID FAST BACILLI SEEN   Final   Culture     Final   Value: CULTURE WILL BE EXAMINED FOR 6 WEEKS BEFORE ISSUING A FINAL REPORT   Report Status PENDING   Incomplete  RESPIRATORY VIRUS PANEL     Status: None   Collection Time    01/24/13  8:18 AM      Result Value Range Status   Source - RVPAN     Corrected   Value: CORRECTED ON 06/26 AT 0848: PREVIOUSLY REPORTED AS NASAL SWAB   Comment: CORRECTED ON 06/27 AT 1808: PREVIOUSLY REPORTED AS NASOPHARYNGEAL, CORRECTED ON 06/26 AT 0848: PREVIOUSLY REPORTED AS NASAL SWAB   Respiratory Syncytial Virus A NOT DETECTED   Final   Respiratory Syncytial Virus B NOT DETECTED   Final   Influenza A NOT DETECTED   Final   Influenza B NOT DETECTED   Final   Parainfluenza 1 NOT DETECTED   Final   Parainfluenza 2 NOT DETECTED   Final   Parainfluenza 3 NOT DETECTED   Final   Metapneumovirus NOT DETECTED   Final   Rhinovirus NOT DETECTED   Final   Adenovirus NOT DETECTED   Final   Influenza A H1 NOT DETECTED   Final   Influenza A H3 NOT DETECTED   Final   Comment: (NOTE)           Normal Reference Range for each Analyte: NOT DETECTED     Testing performed using the Luminex xTAG Respiratory Viral Panel test     kit.     This test was developed and its performance characteristics determined     by [employer]. It has not been cleared or approved by the [place]     Food and Drug Administration.  This test is used for clinical purposes.     It should not be regarded as investigational or for research. This     laboratory is certified under the Clinical Laboratory  Improvement     Amendments of 1988 (CLIA) as qualified to perform high complexity     clinical laboratory testing.  AFB CULTURE WITH SMEAR     Status: None   Collection Time    01/24/13  6:18 PM      Result Value Range Status   Specimen Description SPUTUM   Final   Special Requests Immunocompromised   Final   ACID FAST SMEAR NO ACID FAST BACILLI SEEN   Final   Culture     Final   Value: CULTURE WILL BE EXAMINED FOR 6 WEEKS BEFORE ISSUING A FINAL REPORT   Report Status PENDING   Incomplete  AFB CULTURE WITH SMEAR     Status: None   Collection Time    01/25/13  2:27 PM      Result Value Range Status   Specimen Description SPUTUM   Final   Special Requests Immunocompromised   Final   ACID FAST SMEAR NO ACID FAST BACILLI SEEN   Final   Culture     Final   Value: CULTURE WILL BE EXAMINED FOR 6 WEEKS BEFORE ISSUING A FINAL REPORT   Report Status PENDING   Incomplete  CLOSTRIDIUM DIFFICILE BY PCR     Status: None   Collection Time    01/25/13  9:25 PM      Result Value Range Status   C difficile by pcr NEGATIVE  NEGATIVE Final  AFB CULTURE WITH SMEAR     Status: None   Collection Time    01/28/13  3:28 PM      Result Value Range Status   Specimen Description PLEURAL FLUID RIGHT   Final   Special Requests FLUID   Final   ACID FAST SMEAR NO ACID FAST BACILLI SEEN   Final   Culture     Final   Value: CULTURE WILL BE EXAMINED FOR 6 WEEKS BEFORE ISSUING A FINAL REPORT   Report Status PENDING   Incomplete  BODY FLUID CULTURE     Status: None   Collection Time    01/28/13  3:28 PM      Result Value Range Status   Specimen Description PLEURAL FLUID RIGHT   Final   Special Requests FLUID   Final   Gram Stain     Final   Value: NO WBC SEEN     NO ORGANISMS SEEN   Culture NO GROWTH 1 DAY   Final   Report Status PENDING   Incomplete  FUNGUS CULTURE W SMEAR     Status: None   Collection Time    01/28/13  3:28 PM      Result Value Range Status   Specimen Description FLUID  PLEURAL RIGHT   Final   Special Requests 60CC   Final   Fungal Smear NO YEAST OR FUNGAL ELEMENTS SEEN   Final   Culture CULTURE IN PROGRESS FOR FOUR WEEKS   Final   Report Status PENDING   Incomplete   Assessment: Although 3 sputum AFB smears are negative and his pleural fluid AFB smear is negative I am still very concerned about the possibility of tuberculous pleuritis given the pleural fluid analyses, is positive interferon gamma release assay for latent tuberculosis and recent treatment with Humira. Based on his temperature curve I do  not see that he is improving with standard therapy for community-acquired pneumonia and it would be very unlikely for CAP to cause a true FUO and exudative pleural effusion lasting over one month.  I will stop his ceftriaxone and azithromycin now. I discussed the situation with Dr. Sung Amabile who will speak to Mr. Condie about diagnostic bronchoscopy in the morning. After his bronchoscopy I would favor starting him on empiric 4 drug therapy for possible tuberculous pleuritis.  I did speak with [facility] lab this morning and there was extrapleural fluid that will be sent out to reference laboratories for her M tb PCR and adenosine deaminase which can be useful in helping support a diagnosis of tuberculous pleuritis more rapidly than culture but these test results likely to be back within the next week.  Plan: 1. Consider bronchoscopy in a.m. 2. Discontinue ceftriaxone and azithromycin 3. Start INH 300 mg daily, rifampin 600 mg daily, pyrazinamide 1500 mg daily and ethambutol 1200 mg daily along with vitamin B6 50 mg daily AFTER the bronchoscopy 4. Await final pleural fluid study results  Cliffton Asters, MD [facility] [place] [facility] [phone] pager   [phone] cell 01/30/2013, 2:19 PM

## 2013-01-31 ENCOUNTER — Encounter (HOSPITAL_COMMUNITY)
Admission: AD | Disposition: A | Discharge status: Home / Self Care | Payer: Self-pay | Source: Other Acute Inpatient Hospital | Attending: Internal Medicine

## 2013-01-31 ENCOUNTER — Inpatient Hospital Stay (HOSPITAL_COMMUNITY): Payer: Medicare Other

## 2013-01-31 HISTORY — PX: VIDEO BRONCHOSCOPY: SHX5072

## 2013-01-31 LAB — CMV (CYTOMEGALOVIRUS) DNA ULTRAQUANT, PCR: CMV DNA Quant: <200 copies/mL (ref <200)

## 2013-01-31 LAB — CBC
HCT: 31.5 % — ABNORMAL LOW (ref 39.0–52.0)
Hemoglobin: 11 g/dL — ABNORMAL LOW (ref 13.0–17.0)
MCH: 30.8 pg (ref 26.0–34.0)
MCHC: 34.9 g/dL (ref 30.0–36.0)
MCV: 88.2 fL (ref 78.0–100.0)
Platelets: 237 10*3/uL (ref 150–400)
RBC: 3.57 MIL/uL — ABNORMAL LOW (ref 4.22–5.81)
RDW: 12.7 % (ref 11.5–15.5)
WBC: 4.4 10*3/uL (ref 4.0–10.5)

## 2013-01-31 LAB — BASIC METABOLIC PANEL
BUN: 6 mg/dL (ref 6–23)
CO2: 22 mEq/L (ref 19–32)
Calcium: 7.8 mg/dL — ABNORMAL LOW (ref 8.4–10.5)
Chloride: 101 mEq/L (ref 96–112)
Creatinine, Ser: 0.84 mg/dL (ref 0.50–1.35)
GFR calc Af Amer: >90 mL/min (ref >90)
GFR calc non Af Amer: 88 mL/min — ABNORMAL LOW (ref >90)
Glucose, Bld: 94 mg/dL (ref 70–99)
Potassium: 3.4 mEq/L — ABNORMAL LOW (ref 3.5–5.1)
Sodium: 132 mEq/L — ABNORMAL LOW (ref 135–145)

## 2013-01-31 SURGERY — VIDEO BRONCHOSCOPY WITHOUT FLUORO
Anesthesia: Moderate Sedation | Laterality: Bilateral

## 2013-01-31 MED ORDER — POTASSIUM CHLORIDE CRYS ER 20 MEQ PO TBCR
40.0000 meq | EXTENDED_RELEASE_TABLET | Freq: Once | ORAL | Status: AC
  Administered 2013-01-31: 40 meq via ORAL
  Filled 2013-01-31: qty 2

## 2013-01-31 MED ORDER — RIFAMPIN 300 MG PO CAPS
600.0000 mg | ORAL_CAPSULE | Freq: Every day | ORAL | Status: DC
  Administered 2013-01-31 – 2013-02-02 (×3): 600 mg via ORAL
  Filled 2013-01-31 (×3): qty 2

## 2013-01-31 MED ORDER — MIDAZOLAM HCL 10 MG/2ML IJ SOLN
INTRAMUSCULAR | Status: DC | PRN
  Administered 2013-01-31: 4 mg via INTRAVENOUS

## 2013-01-31 MED ORDER — MIDAZOLAM HCL 5 MG/ML IJ SOLN
INTRAMUSCULAR | Status: AC
  Filled 2013-01-31: qty 2

## 2013-01-31 MED ORDER — PHENYLEPHRINE HCL 0.25 % NA SOLN
NASAL | Status: DC | PRN
  Administered 2013-01-31: 2 via NASAL

## 2013-01-31 MED ORDER — LIDOCAINE HCL 2 % EX GEL
CUTANEOUS | Status: DC | PRN
  Administered 2013-01-31: 1

## 2013-01-31 MED ORDER — GUAIFENESIN-DM 100-10 MG/5ML PO SYRP
5.0000 mL | ORAL_SOLUTION | ORAL | Status: DC | PRN
  Administered 2013-01-31: 5 mL via ORAL
  Filled 2013-01-31: qty 5

## 2013-01-31 MED ORDER — FENTANYL CITRATE 0.05 MG/ML IJ SOLN
INTRAMUSCULAR | Status: DC | PRN
  Administered 2013-01-31: 25 ug via INTRAVENOUS

## 2013-01-31 MED ORDER — ISONIAZID 300 MG PO TABS
300.0000 mg | ORAL_TABLET | Freq: Every day | ORAL | Status: DC
  Administered 2013-01-31 – 2013-02-02 (×3): 300 mg via ORAL
  Filled 2013-01-31 (×3): qty 1

## 2013-01-31 MED ORDER — PYRAZINAMIDE 500 MG PO TABS
1500.0000 mg | ORAL_TABLET | Freq: Every day | ORAL | Status: DC
  Administered 2013-01-31 – 2013-02-02 (×3): 1500 mg via ORAL
  Filled 2013-01-31 (×3): qty 3

## 2013-01-31 MED ORDER — MENTHOL 3 MG MT LOZG
1.0000 | LOZENGE | OROMUCOSAL | Status: DC | PRN
  Administered 2013-01-31: 3 mg via ORAL
  Filled 2013-01-31: qty 9

## 2013-01-31 MED ORDER — FENTANYL CITRATE 0.05 MG/ML IJ SOLN
INTRAMUSCULAR | Status: AC
  Filled 2013-01-31: qty 4

## 2013-01-31 MED ORDER — ETHAMBUTOL HCL 400 MG PO TABS
1200.0000 mg | ORAL_TABLET | Freq: Every day | ORAL | Status: DC
Start: 2013-01-31 — End: 2013-02-02
  Administered 2013-01-31 – 2013-02-02 (×3): 1200 mg via ORAL
  Filled 2013-01-31 (×3): qty 3

## 2013-01-31 MED ORDER — VITAMIN B-6 50 MG PO TABS
50.0000 mg | ORAL_TABLET | Freq: Every day | ORAL | Status: DC
  Administered 2013-01-31 – 2013-02-02 (×3): 50 mg via ORAL
  Filled 2013-01-31 (×3): qty 1

## 2013-01-31 MED ORDER — LIDOCAINE HCL (PF) 1 % IJ SOLN
INTRAMUSCULAR | Status: DC | PRN
  Administered 2013-01-31: 5 mL

## 2013-01-31 NOTE — Progress Notes (Signed)
Pt is back from Bronch.  Instructed not to eat or drink until 5pmabd can have ice chips as instructed by Meredith,RT who brought pt up to his room.  Pt and family  At the bedside made aware.  Amanda Pea, Charity fundraiser.

## 2013-01-31 NOTE — Progress Notes (Signed)
Pt tolerated meds well after bronch.  Family remain at bedside.  Willow Reczek,RN.

## 2013-01-31 NOTE — Progress Notes (Signed)
Pt transported to endo for bronch via w/c.   Family at bedside.  Amanda Pea, Charity fundraiser.

## 2013-01-31 NOTE — Procedures (Signed)
Bronchoscopy Procedure Note Alan Roman [id number] [birth year]  Procedure: Bronchoscopy Indications: Obtain specimens for culture and/or other diagnostic studies  Procedure Details Consent: Risks of procedure as well as the alternatives and risks of each were explained to the (patient/caregiver).  Consent for procedure obtained. Time Out: Verified patient identification, verified procedure, site/side was marked, verified correct patient position, special equipment/implants available, medications/allergies/relevent history reviewed, required imaging and test results available.  Performed  In preparation for procedure, bronchoscope lubricated. Sedation: Benzodiazepines and fentanyl - 4 mg, 25 mcg  Airway entered and the following bronchi were examined: RUL, RML, RLL, LUL, LLL and Bronchi.   Procedures performed: washings of RUL, RML, RLL Bronchoscope removed.    Evaluation Hemodynamic Status: BP stable throughout; O2 sats: stable throughout Patient's Current Condition: stable Specimens:  Sent serosanguinous fluid Complications: No apparent complications Patient did tolerate procedure well.  Specimens sent for AFB and other micro   Alan Roman 01/31/2013

## 2013-01-31 NOTE — Progress Notes (Signed)
Spoke with Dr.Simonds asking if pt can resume diet and instructed that he can resume his previous diet he was on before bronch.  Amanda Pea, Charity fundraiser.

## 2013-01-31 NOTE — Progress Notes (Signed)
Patient TMAX 37.8 oral. Tylenol given x1. Patient resting comfortably at bedside. RN will continue to monitor. Louretta Parma, RN

## 2013-01-31 NOTE — Progress Notes (Signed)
Patient ID: Alan Roman, male   DOB: [birth year], 67 y.o.   MRN: [id number]         [facility]    Date of Admission:  01/23/2013     Principal Problem:   Fever, unknown origin Active Problems:   Psoriasis   Immunocompromised state   Hyponatremia   Pleural effusion   Acute bronchiolitis due to other infectious organisms   Pulmonary infiltrate   Subjective: His dry cough persists then he remained short of breath when coughing. He has not noticed any change in his pattern of fever and sweats.  Objective: Temp:  [97.9 F (36.6 C)-100.4 F (38 C)] 98.4 F (36.9 C) (07/03 0618) Pulse Rate:  [83-104] 88 (07/03 0507) Resp:  [1-18] 1 (07/03 0507) BP: (102-130)/(52-84) 102/52 mmHg (07/03 0507) SpO2:  [97 %-98 %] 98 % (07/03 0507) Weight:  [69.491 kg (153 lb 3.2 oz)] 69.491 kg (153 lb 3.2 oz) (07/03 0507)  General: Alert and in no distress Skin: No rash Lungs: Decreased breath sounds on the right side Cor: Regular S1 and S2 with no murmurs Abdomen: Healed midline incision   Lab Results Lab Results  Component Value Date   WBC 4.4 01/31/2013   HGB 11.0* 01/31/2013   HCT 31.5* 01/31/2013   MCV 88.2 01/31/2013   PLT 237 01/31/2013    Lab Results  Component Value Date   CREATININE 0.84 01/31/2013   BUN 6 01/31/2013   NA 132* 01/31/2013   K 3.4* 01/31/2013   CL 101 01/31/2013   CO2 22 01/31/2013    Lab Results  Component Value Date   ALT 62* 01/29/2013   AST 59* 01/29/2013   ALKPHOS 74 01/29/2013   BILITOT 0.4 01/29/2013      Microbiology: Recent Results (from the past 240 hour(s))  URINE CULTURE     Status: None   Collection Time    01/23/13  2:13 PM      Result Value Range Status   Specimen Description URINE, RANDOM   Final   Special Requests Immunocompromised   Final   Culture  Setup Time 01/23/2013 18:38   Final   Colony Count NO GROWTH   Final   Culture NO GROWTH   Final   Report Status 01/24/2013 FINAL   Final  MALARIA SMEAR     Status: None   Collection Time    01/23/13 11:57 PM      Result Value Range Status   Specimen Description Blood   Final   Special Requests Immunocompromised   Final   Malaria Prep     Final   Value: No Plasmodium or Other Blood Parasites Seen on Thick or Thin Smears For persons strongly suspected of having a blood parasite,but have negative smears, it is recommended that blood films be repeated approximately every 12 to 24 hours for 3 consecutive      days.   Report Status 01/25/2013 FINAL   Final  CULTURE, BLOOD (ROUTINE X 2)     Status: None   Collection Time    01/23/13 11:58 PM      Result Value Range Status   Specimen Description BLOOD RIGHT ARM   Final   Special Requests BOTTLES DRAWN AEROBIC ONLY 10CC   Final   Culture  Setup Time 01/24/2013 05:06   Final   Culture NO GROWTH 5 DAYS   Final   Report Status 01/30/2013 FINAL   Final  CULTURE, BLOOD (ROUTINE X 2)     Status:  None   Collection Time    01/23/13 11:58 PM      Result Value Range Status   Specimen Description BLOOD LEFT ARM   Final   Special Requests BOTTLES DRAWN AEROBIC ONLY 10CC   Final   Culture  Setup Time 01/24/2013 05:06   Final   Culture     Final   Value: STAPHYLOCOCCUS SPECIES (COAGULASE NEGATIVE)     Note: THE SIGNIFICANCE OF ISOLATING THIS ORGANISM FROM A SINGLE SET OF BLOOD CULTURES WHEN MULTIPLE SETS ARE DRAWN IS UNCERTAIN. PLEASE NOTIFY THE MICROBIOLOGY DEPARTMENT WITHIN ONE WEEK IF SPECIATION AND SENSITIVITIES ARE REQUIRED.     Note: Gram Stain Report Called to,Read Back By and Verified With: DALLAS HART AT 04:33 ON 01/25/2013   Report Status 01/26/2013 FINAL   Final  CULTURE, EXPECTORATED SPUTUM-ASSESSMENT     Status: None   Collection Time    01/24/13  7:53 AM      Result Value Range Status   Specimen Description SPUTUM   Final   Special Requests Immunocompromised   Final   Sputum evaluation     Final   Value: MICROSCOPIC FINDINGS SUGGEST THAT THIS SPECIMEN IS NOT REPRESENTATIVE OF LOWER RESPIRATORY SECRETIONS.  PLEASE RECOLLECT.     Gram Stain Report Called to,Read Back By and Verified With: Pablo Lawrence RN 8:45 01/24/13 ([email or web address])   Report Status 01/24/2013 FINAL   Final  AFB CULTURE WITH SMEAR     Status: None   Collection Time    01/24/13  7:53 AM      Result Value Range Status   Specimen Description SPUTUM   Final   Special Requests ADD [id number]   Final   ACID FAST SMEAR NO ACID FAST BACILLI SEEN   Final   Culture     Final   Value: CULTURE WILL BE EXAMINED FOR 6 WEEKS BEFORE ISSUING A FINAL REPORT   Report Status PENDING   Incomplete  RESPIRATORY VIRUS PANEL     Status: None   Collection Time    01/24/13  8:18 AM      Result Value Range Status   Source - RVPAN     Corrected   Value: CORRECTED ON 06/26 AT 0848: PREVIOUSLY REPORTED AS NASAL SWAB   Comment: CORRECTED ON 06/27 AT 1808: PREVIOUSLY REPORTED AS NASOPHARYNGEAL, CORRECTED ON 06/26 AT 0848: PREVIOUSLY REPORTED AS NASAL SWAB   Respiratory Syncytial Virus A NOT DETECTED   Final   Respiratory Syncytial Virus B NOT DETECTED   Final   Influenza A NOT DETECTED   Final   Influenza B NOT DETECTED   Final   Parainfluenza 1 NOT DETECTED   Final   Parainfluenza 2 NOT DETECTED   Final   Parainfluenza 3 NOT DETECTED   Final   Metapneumovirus NOT DETECTED   Final   Rhinovirus NOT DETECTED   Final   Adenovirus NOT DETECTED   Final   Influenza A H1 NOT DETECTED   Final   Influenza A H3 NOT DETECTED   Final   Comment: (NOTE)           Normal Reference Range for each Analyte: NOT DETECTED     Testing performed using the Luminex xTAG Respiratory Viral Panel test     kit.     This test was developed and its performance characteristics determined     by [employer]. It has not been cleared or approved by the [place]     Food and Drug Administration. This  test is used for clinical purposes.     It should not be regarded as investigational or for research. This     laboratory is certified under the Clinical Laboratory Improvement      Amendments of 1988 (CLIA) as qualified to perform high complexity     clinical laboratory testing.  AFB CULTURE WITH SMEAR     Status: None   Collection Time    01/24/13  6:18 PM      Result Value Range Status   Specimen Description SPUTUM   Final   Special Requests Immunocompromised   Final   ACID FAST SMEAR NO ACID FAST BACILLI SEEN   Final   Culture     Final   Value: CULTURE WILL BE EXAMINED FOR 6 WEEKS BEFORE ISSUING A FINAL REPORT   Report Status PENDING   Incomplete  AFB CULTURE WITH SMEAR     Status: None   Collection Time    01/25/13  2:27 PM      Result Value Range Status   Specimen Description SPUTUM   Final   Special Requests Immunocompromised   Final   ACID FAST SMEAR NO ACID FAST BACILLI SEEN   Final   Culture     Final   Value: CULTURE WILL BE EXAMINED FOR 6 WEEKS BEFORE ISSUING A FINAL REPORT   Report Status PENDING   Incomplete  CLOSTRIDIUM DIFFICILE BY PCR     Status: None   Collection Time    01/25/13  9:25 PM      Result Value Range Status   C difficile by pcr NEGATIVE  NEGATIVE Final  AFB CULTURE WITH SMEAR     Status: None   Collection Time    01/28/13  3:28 PM      Result Value Range Status   Specimen Description PLEURAL FLUID RIGHT   Final   Special Requests FLUID   Final   ACID FAST SMEAR NO ACID FAST BACILLI SEEN   Final   Culture     Final   Value: CULTURE WILL BE EXAMINED FOR 6 WEEKS BEFORE ISSUING A FINAL REPORT   Report Status PENDING   Incomplete  BODY FLUID CULTURE     Status: None   Collection Time    01/28/13  3:28 PM      Result Value Range Status   Specimen Description PLEURAL FLUID RIGHT   Final   Special Requests FLUID   Final   Gram Stain     Final   Value: NO WBC SEEN     NO ORGANISMS SEEN   Culture NO GROWTH 1 DAY   Final   Report Status PENDING   Incomplete  FUNGUS CULTURE W SMEAR     Status: None   Collection Time    01/28/13  3:28 PM      Result Value Range Status   Specimen Description FLUID PLEURAL RIGHT    Final   Special Requests 60CC   Final   Fungal Smear NO YEAST OR FUNGAL ELEMENTS SEEN   Final   Culture CULTURE IN PROGRESS FOR FOUR WEEKS   Final   Report Status PENDING   Incomplete    Assessment: So far all pleural fluid cultures are negative and his cytology revealed acute inflammation. 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 As far as I am concerned, it would be okay to let him go home on that regimen with followup  in our clinic.   Plan: 1.  start isoniazid, rifampin, pyrazinamide, and ethambutol after his bronchoscopy  2.  consider discharge home over the holiday weekend with followup in our clinic  Cliffton Asters, MD [facility] for Infectious Disease [facility] [phone] pager   [phone] cell 01/31/2013, 11:00 AM

## 2013-01-31 NOTE — Progress Notes (Signed)
[facility] PROGRESS NOTE  Alan Roman GUY:[id number] DOB: [birth year] DOA: 01/23/2013 PCP: Ignatius Specking., MD Brief history  67 year old gentleman with a history of psoriasis diagnosed 10 years ago, previously on prednisone, methotrexate, and Enbrel x 5 years. Most recently, the patient has been placed on a Humira for the past 2 years. His last dose was on 12/24/2012. The patient presented with 3 weeks of intermittent fevers ranging from 100.0-101.77F with a maximum of 102.77F associated with chills. The patient also endorsed a dry nonproductive cough and intermittent occipital headache. The patient was treated with amoxicillin by his primary care physician 2 weeks ago for bronchitis. He completed a course of antibiotics. He went to see another physician who diagnosed him with a UTI and started him on Bactrim. The patient continued to have fevers and fatigue. He also endorses a 7 pound weight loss in the past 3 weeks. The patient had 2 visits [place], most recently returned end of March 2014. He did not take malaria prophylaxis, but he stayed in screened rooms with airconditioning. No sick contacts. He drank bottled water. He was admitted to [facility] on 01/21/2013. He was started on ceftriaxone and acyclovir. His labs at [facility] show WBC 5.4, Hb 11.8, platelets 247. His differential showed slightly increased monocytes with 23.4 %. He also had hyponatremia of 128 in consecutive BMPs there. His ECHO did not show any evidence of vegetations but he had thickened MV with mild MR. His monospot test was negative.  He was transferred to [facility] on 01/23/13 do to FUO. Since admission, the patient has continued to have intermittent fevers. Infectious disease was consulted. Initial chest x-ray revealed right basilar density with parapneumonic effusion. The patient was started on ceftriaxone and azithromycin. His fevers persisted. Sputum AFB smears were negative x3 The patient had abnormal CT of the chest with "tree in  bud" opacity in RUL. Pulmonology was consulted. They recommended thoracocentesis. CT of the abdomen showed interstitial thickening of the right upper quadrant intraperitoneal space there was nonspecific. Thoracocentesis was performed on 01/28/2013. Fluid is exudative.  Assessment/Plan: FUO  -Working diagnosis is TB vs pneumonia suspect atypical  -off ceftriaxone/azithro--last dose 01/27/13 -restart yesterday per ID -QuantiFERON-TB-->positive  -followup AFB sputum--neg x3  -follow up malaria smear--neg  -fever curve is fluctuating. -repiratory viral panel neg  -Last dose of Humira was 12/24/12  -CT chest--"tree in bud" opacity RUL, L-subpleural nodules, scattered mediastinal LN, right pleural effusion  -appreciate Pulmonary consultation  -Stool for C. difficile PCR--neg  -CT abdomen and pelvis--no gallstones or gallbladder wall thickening. Interstitial thickening in the right upper quadrant intraperitoneal space, small ascites  -thoracocentesis (01/28/13)- 170cc cloudy fluid  -ordered pleural fluid for silver stain, AFB culture/strain, routine Gram stain/culture, cytology, cell count, LDH, protein, glucose  -Preliminary bacterial, AFB, and fungal cultures on pleural fluid negative  -HIV serology--neg -MTB PCR ordered on pleural fluid  Rocephin and azithromycin stopped per ID today. Patient for bronchoscopy today per pulm. Patient to be started on 4 drug TB therapy per ID after broncoscopy today. Pleural effusion  -Exudative by light criteria  -340 WBCs--90% monocytes  -Await culture data  Cytology with abundant inflammatory cells. Bacteremia  -Likely to be contaminant  -d/c vancomycin  Pneumonia  -01/23/2013 chest x-ray shows right basilar opacity with parapneumonic effusion  Transaminasemia  -stable  -likely due to ceftriaxone and azithromycin  -continue to monitor  Hyponatremia  -Likely volume depletion, poor solute intake  -Start normal saline-->improving  -TSH 2.384   Hypertension  -controlled  -hold amlodipine, blood  pressure soft  weight loss, generalized weakness  -Physical therapy  -Check prealbumin--10.0  -Start nutritional supplement  Family Communication: Daughter at beside  Disposition Plan: Home when medically stable  Antibiotics:  Ceftriaxone 01/23/2013>>> 01/27/13----restart 01/29/13--->01/30/13 Azithromycin 01/23/2013>>>01/27/13----restart 01/29/13----> 01/30/13 Procedures:  --Thoracocentesis--01/28/13  Family Communication:   daughter at beside Disposition Plan:   Home when medically stable     Procedures/Studies: Dg Chest 1 View  01/28/2013   *RADIOLOGY REPORT*  Clinical Data: Status post right thoracentesis  CHEST - 1 VIEW  Comparison: CT chest dated 01/25/2013  Findings: No pneumothorax is seen status post right thoracentesis. Small right pleural effusion, improved.  Stable mild elevation of the right hemidiaphragm.  The heart is normal in size.  IMPRESSION: No pneumothorax status post right thoracentesis.  Small right pleural effusion, improved.   Original Report Authenticated By: Charline Bills, M.D.   Dg Chest 2 View  01/29/2013   *RADIOLOGY REPORT*  Clinical Data: Right lower lobe infiltrate with weakness cough and congestion.  History of hypertension.  Nonsmoker  CHEST - 2 VIEW  Comparison: 06/30 and 01/12/2013  Findings: A small right pleural effusion is again noted.  Heart and mediastinal contours are stable. Anterior eventration of the right hemidiaphragm is seen with some resultant associated basilar atelectasis.  No significant change in the appearance of the right lower lobe is otherwise identified in comparison with prior exam. No new focal infiltrates or signs of congestive failure are seen. No left pleural fluid is identified.  Bony structures are unchanged.  IMPRESSION: Small right pleural effusion, right basilar atelectasis and unchanged right basilar density.   Original Report Authenticated By: Rhodia Albright, M.D.   Dg Chest  2 View  01/23/2013   *RADIOLOGY REPORT*  Clinical Data: Fever.  Cough.  CHEST - 2 VIEW  Comparison: None.  Findings: Probable small right subpulmonic pleural effusion. Blunting of right costophrenic angle is present.  Right basilar volume loss/atelectasis.  Patchy airspace density extends to the right costophrenic angle suspicious for pneumonia.  Unchanged elevation of the right hemidiaphragm with right basilar atelectasis.  Small sclerotic focus of the posterior aspect of the T5 vertebra of these probably unchanged compared to 05/29/2012 allowing for differences in projection.  This may be associated with rib trauma or costovertebral osteoarthritis.  Follow-up to ensure radiographic clearing recommended.  Clearing is usually observed at 8 weeks.  IMPRESSION: Right basilar/right lower lobe airspace disease with probable small parapneumonic effusion.  Findings suspicious for pneumonia.   Original Report Authenticated By: Andreas Newport, M.D.   Ct Chest Wo Contrast  01/25/2013   *RADIOLOGY REPORT*  Clinical Data: Cough.  Shortness of breath.  Chest pain.  Probable pneumonia on recent imaging.  Immunocompromised patient.  CT CHEST WITHOUT CONTRAST  Technique:  Multidetector CT imaging of the chest was performed following the standard protocol without IV contrast.  Comparison: No prior chest CT.  Two-view chest x-ray 01/23/2013.  Findings: Respiratory motion blurred many of the images.  Large right pleural effusion with associated passive atelectasis in the right lower lobe.  No confluent airspace consolidation elsewhere. Tree in bud opacities in the inferior right upper lobe.  2 mm subpleural nodule in the inferior left upper lobe (series 3, image 24) and polygonal subpleural nodule in the superior segment left lower lobe adjacent to the fissure (image 20).  No significant pulmonary parenchymal nodules.  No left pleural effusion.  Central airways patent without significant bronchial wall thickening.  Heart size  normal.  Prominent epicardial fat and prominent paracardiac fat  pads.  Mild to moderate LAD coronary atherosclerosis.  No pericardial effusion.  Mild atherosclerosis involving the thoracic and upper abdominal aorta without aneurysm.  Numerous mediastinal lymph nodes which are borderline in size, the largest a station 4R node at the carina measuring approximately 1.5 x 1.2 cm.  No nodal masses.  Thyroid gland normal in appearance.  Very small hiatal hernia.  Inflammatory changes in the upper abdomen in the midline and right upper quadrant, incompletely imaged.  Bone window images demonstrate diffuse thoracic spondylosis.  IMPRESSION:  1.  Large right pleural effusion and associated passive atelectasis in the right lower lobe. 2.  Tree in bud opacities in the inferior right upper lobe which are consistent with infection/alveolitis. 3.  Inflammatory changes involving the upper abdomen in the midline and right upper quadrant.  These findings are incompletely imaged. Dedicated CT abdomen and pelvis may be helpful for further characterization. 4.  Scattered borderline mediastinal lymph nodes, likely reactive. 5.  Very small hiatal hernia.   Original Report Authenticated By: Hulan Saas, M.D.   Ct Abdomen Pelvis W Contrast  01/26/2013   *RADIOLOGY REPORT*  Clinical Data: Right upper quadrant pain and midline inflammatory process.  Hypertension and arthritis.  Fever of unknown origin. Immunocompromised state.  CT ABDOMEN AND PELVIS WITH CONTRAST  Technique:  Multidetector CT imaging of the abdomen and pelvis was performed following the standard protocol during bolus administration of intravenous contrast.  Contrast: OMNIPAQUE IOHEXOL 300 MG/ML  SOLN  Comparison: 03/08/2007 abdominal pelvic CT.  Chest CT of 1 day prior  Findings: Lung bases:  Clear lung bases.  1.4 cm azygo-esophageal recess node.  Normal heart size with coronary artery atherosclerosis.  A small to moderate right pleural effusion is not  significantly changed. Bilateral cardiophrenic angle nodes which are mildly enlarged.  Largest is on the left at 8 mm.  Abdomen/pelvis:  Normal appearance of the liver.  Old granulomas disease in the spleen.  Normal stomach, pancreas.  The gallbladder is incompletely distended.  No calcified stones or wall thickening. No biliary ductal dilatation.  Normal adrenal glands and kidneys.  Double IVC. No retroperitoneal or retrocrural adenopathy.  Scattered colonic diverticula.  Normal terminal ileum and appendix.  Normal small bowel caliber.  Small volume ascites is identified, including within both pericolic gutters and in the pelvic cul-de- sac.  There has been interval ventral abdominal wall hernia repair since 03/08/2007.  Interstitial thickening is identified within the right upper quadrant and extending into the anterior right abdomen. No definite localizing source is seen.  No well-defined peritoneal mass.  No inflamed adjacent bowel loops.  There are mildly prominent collateral veins in this region including on image 25/series 2.  No pelvic adenopathy.    Normal urinary bladder and prostate.  Bones/Musculoskeletal:  No acute osseous abnormality.  IMPRESSION:  1.  Similar moderate right-sided pleural effusion with lower thoracic adenopathy, indeterminate. 2.  Edema/interstitial thickening within the intraperitoneal space of the right upper quadrant.  No definite localizing source is seen.  There has been prior ventral hernia repair and there are prominent collateral veins in this region.  Question whether this interstitial thickening is postoperative.  Depending on the right upper quadrant symptomatology, potential clinical strategies of ultrasound to evaluate the gallbladder versus follow-up CT to confirm stability or resolution (i.e.  4 - 6 weeks). 3.  Small volume concurrent abdominal pelvic ascites, indeterminate etiology. 4.  Double IVC.   Original Report Authenticated By: Jeronimo Greaves, M.D.   US  Thoracentesis Asp  Pleural Space W/img Guide  01/28/2013   *RADIOLOGY REPORT*  Clinical Data:  Right pleural effusion  ULTRASOUND GUIDED right THORACENTESIS  Comparison:  None  An ultrasound guided thoracentesis was thoroughly discussed with the patient and questions answered.  The benefits, risks, alternatives and complications were also discussed.  The patient understands and wishes to proceed with the procedure.  Written consent was obtained.  Ultrasound was performed to localize and mark an adequate pocket of fluid in the right chest.  The area was then prepped and draped in the normal sterile fashion.  1% Lidocaine was used for local anesthesia.  Under ultrasound guidance a 19 gauge Yueh catheter was introduced.  Thoracentesis was performed.  The catheter was removed and a dressing applied.  Complications:  None  Findings: A total of approximately 170 ml of cloudy yellow fluid was removed. A fluid sample was sent for laboratory analysis.  IMPRESSION: Successful ultrasound guided right thoracentesis yielding 170 ml of pleural fluid.  Read by: Ralene Muskrat, P.A.-C   Original Report Authenticated By: Tacey Ruiz, MD         Subjective:  Patient states slightly better than yesterday. Patient with fever last night.  Objective: Filed Vitals:   01/30/13 2052 01/30/13 2133 01/31/13 0507 01/31/13 0618  BP: 127/81  102/52   Pulse: 104  88   Temp: 100.4 F (38 C) 99.6 F (37.6 C) 100.1 F (37.8 C) 98.4 F (36.9 C)  TempSrc: Oral Oral Oral Oral  Resp: 18  1   Height:      Weight:   69.491 kg (153 lb 3.2 oz)   SpO2: 98%  98%     Intake/Output Summary (Last 24 hours) at 01/31/13 1044 Last data filed at 01/31/13 1610  Gross per 24 hour  Intake 2041.25 ml  Output   1250 ml  Net 791.25 ml   Weight change: -0.499 kg (-1 lb 1.6 oz) Exam:   General:  Pt is alert, follows commands appropriately, not in acute distress  HEENT: No icterus, No thrush, No neck mass, [place]/AT  Cardiovascular:  RRR, S1/S2, no rubs, no gallops  Respiratory: right basilar crackles. No wheezes. Good air movement.   Abdomen: Soft/+BS, non tender, non distended, no guarding  Extremities: No edema, No lymphangitis, No petechiae, No rashes, no synovitis  Data Reviewed: Basic Metabolic Panel:  Recent Labs Lab 01/27/13 0618 01/28/13 0545 01/29/13 0521 01/30/13 0521 01/31/13 0500  NA 132* 135 134* 133* 132*  K 3.6 3.6 3.8 3.6 3.4*  CL 101 101 101 102 101  CO2 23 25 25 22 22   GLUCOSE 99 93 98 97 94  BUN 6 6 6 7 6   CREATININE 0.85 0.93 0.95 0.83 0.84  CALCIUM 7.5* 7.8* 8.1* 7.9* 7.8*   Liver Function Tests:  Recent Labs Lab 01/26/13 0435 01/27/13 0618 01/28/13 0545 01/29/13 0521  AST 60* 66* 54* 59*  ALT 53 60* 55* 62*  ALKPHOS 76 73 70 74  BILITOT 0.4 0.3 0.4 0.4  PROT 7.3 6.7 6.5 7.1  ALBUMIN 2.6* 2.4* 2.4* 2.5*   No results found for this basename: LIPASE, AMYLASE,  in the last 168 hours No results found for this basename: AMMONIA,  in the last 168 hours CBC:  Recent Labs Lab 01/25/13 0428 01/27/13 0618 01/30/13 0521 01/31/13 0500  WBC 4.2 4.0 4.9 4.4  NEUTROABS 2.1 2.5 2.8  --   HGB 11.9* 10.8* 11.2* 11.0*  HCT 34.1* 30.7* 31.5* 31.5*  MCV 87.9 86.5 87.0 88.2  PLT 234 228 218 237   Cardiac Enzymes: No results found for this basename: CKTOTAL, CKMB, CKMBINDEX, TROPONINI,  in the last 168 hours BNP: No components found with this basename: POCBNP,  CBG: No results found for this basename: GLUCAP,  in the last 168 hours  Recent Results (from the past 240 hour(s))  URINE CULTURE     Status: None   Collection Time    01/23/13  2:13 PM      Result Value Range Status   Specimen Description URINE, RANDOM   Final   Special Requests Immunocompromised   Final   Culture  Setup Time 01/23/2013 18:38   Final   Colony Count NO GROWTH   Final   Culture NO GROWTH   Final   Report Status 01/24/2013 FINAL   Final  MALARIA SMEAR     Status: None   Collection Time     01/23/13 11:57 PM      Result Value Range Status   Specimen Description Blood   Final   Special Requests Immunocompromised   Final   Malaria Prep     Final   Value: No Plasmodium or Other Blood Parasites Seen on Thick or Thin Smears For persons strongly suspected of having a blood parasite,but have negative smears, it is recommended that blood films be repeated approximately every 12 to 24 hours for 3 consecutive      days.   Report Status 01/25/2013 FINAL   Final  CULTURE, BLOOD (ROUTINE X 2)     Status: None   Collection Time    01/23/13 11:58 PM      Result Value Range Status   Specimen Description BLOOD RIGHT ARM   Final   Special Requests BOTTLES DRAWN AEROBIC ONLY 10CC   Final   Culture  Setup Time 01/24/2013 05:06   Final   Culture NO GROWTH 5 DAYS   Final   Report Status 01/30/2013 FINAL   Final  CULTURE, BLOOD (ROUTINE X 2)     Status: None   Collection Time    01/23/13 11:58 PM      Result Value Range Status   Specimen Description BLOOD LEFT ARM   Final   Special Requests BOTTLES DRAWN AEROBIC ONLY 10CC   Final   Culture  Setup Time 01/24/2013 05:06   Final   Culture     Final   Value: STAPHYLOCOCCUS SPECIES (COAGULASE NEGATIVE)     Note: THE SIGNIFICANCE OF ISOLATING THIS ORGANISM FROM A SINGLE SET OF BLOOD CULTURES WHEN MULTIPLE SETS ARE DRAWN IS UNCERTAIN. PLEASE NOTIFY THE MICROBIOLOGY DEPARTMENT WITHIN ONE WEEK IF SPECIATION AND SENSITIVITIES ARE REQUIRED.     Note: Gram Stain Report Called to,Read Back By and Verified With: DALLAS HART AT 04:33 ON 01/25/2013   Report Status 01/26/2013 FINAL   Final  CULTURE, EXPECTORATED SPUTUM-ASSESSMENT     Status: None   Collection Time    01/24/13  7:53 AM      Result Value Range Status   Specimen Description SPUTUM   Final   Special Requests Immunocompromised   Final   Sputum evaluation     Final   Value: MICROSCOPIC FINDINGS SUGGEST THAT THIS SPECIMEN IS NOT REPRESENTATIVE OF LOWER RESPIRATORY SECRETIONS. PLEASE RECOLLECT.      Gram Stain Report Called to,Read Back By and Verified With: Pablo Lawrence RN 8:45 01/24/13 ([email or web address])   Report Status 01/24/2013 FINAL   Final  AFB CULTURE WITH SMEAR     Status: None   Collection  Time    01/24/13  7:53 AM      Result Value Range Status   Specimen Description SPUTUM   Final   Special Requests ADD [id number]   Final   ACID FAST SMEAR NO ACID FAST BACILLI SEEN   Final   Culture     Final   Value: CULTURE WILL BE EXAMINED FOR 6 WEEKS BEFORE ISSUING A FINAL REPORT   Report Status PENDING   Incomplete  RESPIRATORY VIRUS PANEL     Status: None   Collection Time    01/24/13  8:18 AM      Result Value Range Status   Source - RVPAN     Corrected   Value: CORRECTED ON 06/26 AT 0848: PREVIOUSLY REPORTED AS NASAL SWAB   Comment: CORRECTED ON 06/27 AT 1808: PREVIOUSLY REPORTED AS NASOPHARYNGEAL, CORRECTED ON 06/26 AT 0848: PREVIOUSLY REPORTED AS NASAL SWAB   Respiratory Syncytial Virus A NOT DETECTED   Final   Respiratory Syncytial Virus B NOT DETECTED   Final   Influenza A NOT DETECTED   Final   Influenza B NOT DETECTED   Final   Parainfluenza 1 NOT DETECTED   Final   Parainfluenza 2 NOT DETECTED   Final   Parainfluenza 3 NOT DETECTED   Final   Metapneumovirus NOT DETECTED   Final   Rhinovirus NOT DETECTED   Final   Adenovirus NOT DETECTED   Final   Influenza A H1 NOT DETECTED   Final   Influenza A H3 NOT DETECTED   Final   Comment: (NOTE)           Normal Reference Range for each Analyte: NOT DETECTED     Testing performed using the Lum[employer] xTAG Respiratory Viral Panel test     kit.     This test was developed and its performance characteristics determined     by [employer]. It has not been cleared or approved by the [place]     Food and Drug Administration. This test is used for clinical purposes.     It should not be regarded as investigational or for research. This     laboratory is certified under the Clinical Laboratory Improvement     Amendments of 1988 (CLIA) as  qualified to perform high complexity     clinical laboratory testing.  AFB CULTURE WITH SMEAR     Status: None   Collection Time    01/24/13  6:18 PM      Result Value Range Status   Specimen Description SPUTUM   Final   Special Requests Immunocompromised   Final   ACID FAST SMEAR NO ACID FAST BACILLI SEEN   Final   Culture     Final   Value: CULTURE WILL BE EXAMINED FOR 6 WEEKS BEFORE ISSUING A FINAL REPORT   Report Status PENDING   Incomplete  AFB CULTURE WITH SMEAR     Status: None   Collection Time    01/25/13  2:27 PM      Result Value Range Status   Specimen Description SPUTUM   Final   Special Requests Immunocompromised   Final   ACID FAST SMEAR NO ACID FAST BACILLI SEEN   Final   Culture     Final   Value: CULTURE WILL BE EXAMINED FOR 6 WEEKS BEFORE ISSUING A FINAL REPORT   Report Status PENDING   Incomplete  CLOSTRIDIUM DIFFICILE BY PCR     Status: None   Collection Time  01/25/13  9:25 PM      Result Value Range Status   C difficile by pcr NEGATIVE  NEGATIVE Final  AFB CULTURE WITH SMEAR     Status: None   Collection Time    01/28/13  3:28 PM      Result Value Range Status   Specimen Description PLEURAL FLUID RIGHT   Final   Special Requests FLUID   Final   ACID FAST SMEAR NO ACID FAST BACILLI SEEN   Final   Culture     Final   Value: CULTURE WILL BE EXAMINED FOR 6 WEEKS BEFORE ISSUING A FINAL REPORT   Report Status PENDING   Incomplete  BODY FLUID CULTURE     Status: None   Collection Time    01/28/13  3:28 PM      Result Value Range Status   Specimen Description PLEURAL FLUID RIGHT   Final   Special Requests FLUID   Final   Gram Stain     Final   Value: NO WBC SEEN     NO ORGANISMS SEEN   Culture NO GROWTH 1 DAY   Final   Report Status PENDING   Incomplete  FUNGUS CULTURE W SMEAR     Status: None   Collection Time    01/28/13  3:28 PM      Result Value Range Status   Specimen Description FLUID PLEURAL RIGHT   Final   Special Requests 60CC    Final   Fungal Smear NO YEAST OR FUNGAL ELEMENTS SEEN   Final   Culture CULTURE IN PROGRESS FOR FOUR WEEKS   Final   Report Status PENDING   Incomplete     Scheduled Meds: . enoxaparin (LOVENOX) injection  40 mg Subcutaneous Q24H  . feeding supplement  1 Container Oral BID BM  . multivitamin with minerals  1 tablet Oral Daily  . sodium chloride  3 mL Intravenous Q12H   Continuous Infusions: . sodium chloride 75 mL/hr at 01/31/13 0618     Evann Koelzer, MD  [facility] Pager [phone]  If 7PM-7AM, please contact night-coverage [email or web address] Password [email or web address] 01/31/2013, 10:44 AM   LOS: 8 days

## 2013-01-31 NOTE — Progress Notes (Signed)
Patient evaluated for community based chronic disease management services with [facility] Care Management Program as a benefit of patient's [employer].  Spoke with patient and his daughter at bedside to explain [facility] Care Management services. He confirmed his PCP is Dr Ignatius Specking.  They have no difficulty securing same day appointments, medications, and are able to manage his care.  Left contact information and [facility] literature at bedside. Made inpatient Case Manager aware that [facility] Care Management consult.  Of note, [facility] Care Management services does not replace or interfere with any services that are arranged by inpatient case management or social work.  For additional questions or referrals please contact Anibal Henderson BSN RN [facility] Liaison at [phone].

## 2013-01-31 NOTE — Progress Notes (Signed)
Bronchscopy performed with intervention BAL  [name], MD ; [facility] [phone].  After 5:30 PM or weekends, call [phone]

## 2013-02-01 ENCOUNTER — Encounter (HOSPITAL_COMMUNITY): Payer: Self-pay | Admitting: Pulmonary Disease

## 2013-02-01 DIAGNOSIS — A1889 Tuberculosis of other sites: Secondary | ICD-10-CM

## 2013-02-01 LAB — BODY FLUID CULTURE
Culture: NO GROWTH
Gram Stain: NONE SEEN

## 2013-02-01 LAB — BASIC METABOLIC PANEL
BUN: 9 mg/dL (ref 6–23)
CO2: 25 mEq/L (ref 19–32)
Calcium: 7.9 mg/dL — ABNORMAL LOW (ref 8.4–10.5)
Chloride: 99 mEq/L (ref 96–112)
Creatinine, Ser: 0.86 mg/dL (ref 0.50–1.35)
GFR calc Af Amer: >90 mL/min (ref >90)
GFR calc non Af Amer: 87 mL/min — ABNORMAL LOW (ref >90)
Glucose, Bld: 94 mg/dL (ref 70–99)
Potassium: 3.8 mEq/L (ref 3.5–5.1)
Sodium: 132 mEq/L — ABNORMAL LOW (ref 135–145)

## 2013-02-01 LAB — PNEUMOCYSTIS JIROVECI SMEAR BY DFA: Pneumocystis jiroveci Ag: NEGATIVE

## 2013-02-01 MED ORDER — AMLODIPINE BESYLATE 5 MG PO TABS
5.0000 mg | ORAL_TABLET | Freq: Every day | ORAL | Status: DC
  Administered 2013-02-01 – 2013-02-02 (×2): 5 mg via ORAL
  Filled 2013-02-01 (×3): qty 1

## 2013-02-01 NOTE — Progress Notes (Signed)
[facility]    Subjective: No new complaints, sp bronchoscopy   Antibiotics:  Anti-infectives   Start     Dose/Rate Route Frequency Ordered Stop   01/31/13 1800  isoniazid (NYDRAZID) tablet 300 mg     300 mg Oral Daily 01/31/13 1108     01/31/13 1800  rifampin (RIFADIN) capsule 600 mg     600 mg Oral Daily 01/31/13 1108     01/31/13 1800  pyrazinamide tablet 1,500 mg     1,500 mg Oral Daily 01/31/13 1108     01/31/13 1800  ethambutol (MYAMBUTOL) tablet 1,200 mg     1,200 mg Oral Daily 01/31/13 1108     01/29/13 1745  cefTRIAXone (ROCEPHIN) 1 g in dextrose 5 % 50 mL IVPB  Status:  Discontinued     1 g 100 mL/hr over 30 Minutes Intravenous Every 24 hours 01/29/13 1742 01/30/13 1427   01/29/13 1745  azithromycin (ZITHROMAX) tablet 500 mg  Status:  Discontinued     500 mg Oral Daily 01/29/13 1742 01/30/13 1427   01/25/13 1800  vancomycin (VANCOCIN) IVPB 750 mg/150 ml premix  Status:  Discontinued     750 mg 150 mL/hr over 60 Minutes Intravenous Every 12 hours 01/25/13 0635 01/26/13 1304   01/25/13 0700  vancomycin (VANCOCIN) 1,250 mg in sodium chloride 0.9 % 250 mL IVPB     1,250 mg 166.7 mL/hr over 90 Minutes Intravenous  Once 01/25/13 0635 01/25/13 1006   01/23/13 2115  cefTRIAXone (ROCEPHIN) 1 g in dextrose 5 % 50 mL IVPB     1 g 100 mL/hr over 30 Minutes Intravenous Every 24 hours 01/23/13 2110 01/28/13 1759   01/23/13 2115  azithromycin (ZITHROMAX) 500 mg in dextrose 5 % 250 mL IVPB     500 mg 250 mL/hr over 60 Minutes Intravenous Every 24 hours 01/23/13 2110 01/28/13 1759      Medications: Scheduled Meds: . enoxaparin (LOVENOX) injection  40 mg Subcutaneous Q24H  . ethambutol  1,200 mg Oral Daily  . feeding supplement  1 Container Oral BID BM  . isoniazid  300 mg Oral Daily  . multivitamin with minerals  1 tablet Oral Daily  . pyrazinamide  1,500 mg Oral Daily  . vitamin B-6  50 mg Oral Daily  . rifampin  600 mg Oral Daily  . sodium  chloride  3 mL Intravenous Q12H   Continuous Infusions: . sodium chloride 75 mL/hr at 02/01/13 0139   PRN Meds:.acetaminophen, cyclobenzaprine, fentaNYL, guaiFENesin-dextromethorphan, lidocaine (PF), lidocaine, menthol-cetylpyridinium, midazolam, phenylephrine   Objective: Weight change: 4.8 oz (0.136 kg)  Intake/Output Summary (Last 24 hours) at 02/01/13 0918 Last data filed at 02/01/13 0849  Gross per 24 hour  Intake    460 ml  Output   2450 ml  Net  -1990 ml   Blood pressure 116/70, pulse 88, temperature 98.6 F (37 C), temperature source Oral, resp. rate 18, height 5\' 7"  (1.702 m), weight 153 lb 8 oz (69.627 kg), SpO2 99.00%. Temp:  [98.4 F (36.9 C)-100.2 F (37.9 C)] 98.6 F (37 C) (07/04 0603) Pulse Rate:  [88-107] 88 (07/04 0603) Resp:  [9-44] 18 (07/04 0603) BP: (116-164)/(65-107) 116/70 mmHg (07/04 0603) SpO2:  [93 %-99 %] 99 % (07/04 0603) Weight:  [153 lb 8 oz (69.627 kg)] 153 lb 8 oz (69.627 kg) (07/04 0603)  Physical Exam: General: Alert and awake, oriented x3, not in any acute distress. HEENT: anicteric sclera, pupils reactive to light and accommodation, EOMI  CVS regular rate, normal r,  no murmur rubs or gallops Chest: decreased breath sounds on the right side, otherwise clear to auscultation bilaterally, no wheezing, rales or rhonchi Abdomen: soft nontender, nondistended, normal bowel sounds, Extremities: no  clubbing or edema noted bilaterally Skin: no rashes Neuro: nonfocal  Lab Results:  Recent Labs  01/30/13 0521 01/31/13 0500  WBC 4.9 4.4  HGB 11.2* 11.0*  HCT 31.5* 31.5*  PLT 218 237    BMET  Recent Labs  01/31/13 0500 02/01/13 0530  NA 132* 132*  K 3.4* 3.8  CL 101 99  CO2 22 25  GLUCOSE 94 94  BUN 6 9  CREATININE 0.84 0.86  CALCIUM 7.8* 7.9*    Micro Results: Recent Results (from the past 240 hour(s))  URINE CULTURE     Status: None   Collection Time    01/23/13  2:13 PM      Result Value Range Status   Specimen  Description URINE, RANDOM   Final   Special Requests Immunocompromised   Final   Culture  Setup Time 01/23/2013 18:38   Final   Colony Count NO GROWTH   Final   Culture NO GROWTH   Final   Report Status 01/24/2013 FINAL   Final  MALARIA SMEAR     Status: None   Collection Time    01/23/13 11:57 PM      Result Value Range Status   Specimen Description Blood   Final   Special Requests Immunocompromised   Final   Malaria Prep     Final   Value: No Plasmodium or Other Blood Parasites Seen on Thick or Thin Smears For persons strongly suspected of having a blood parasite,but have negative smears, it is recommended that blood films be repeated approximately every 12 to 24 hours for 3 consecutive      days.   Report Status 01/25/2013 FINAL   Final  CULTURE, BLOOD (ROUTINE X 2)     Status: None   Collection Time    01/23/13 11:58 PM      Result Value Range Status   Specimen Description BLOOD RIGHT ARM   Final   Special Requests BOTTLES DRAWN AEROBIC ONLY 10CC   Final   Culture  Setup Time 01/24/2013 05:06   Final   Culture NO GROWTH 5 DAYS   Final   Report Status 01/30/2013 FINAL   Final  CULTURE, BLOOD (ROUTINE X 2)     Status: None   Collection Time    01/23/13 11:58 PM      Result Value Range Status   Specimen Description BLOOD LEFT ARM   Final   Special Requests BOTTLES DRAWN AEROBIC ONLY 10CC   Final   Culture  Setup Time 01/24/2013 05:06   Final   Culture     Final   Value: STAPHYLOCOCCUS SPECIES (COAGULASE NEGATIVE)     Note: THE SIGNIFICANCE OF ISOLATING THIS ORGANISM FROM A SINGLE SET OF BLOOD CULTURES WHEN MULTIPLE SETS ARE DRAWN IS UNCERTAIN. PLEASE NOTIFY THE MICROBIOLOGY DEPARTMENT WITHIN ONE WEEK IF SPECIATION AND SENSITIVITIES ARE REQUIRED.     Note: Gram Stain Report Called to,Read Back By and Verified With: DALLAS HART AT 04:33 ON 01/25/2013   Report Status 01/26/2013 FINAL   Final  CULTURE, EXPECTORATED SPUTUM-ASSESSMENT     Status: None   Collection Time    01/24/13   7:53 AM      Result Value Range Status   Specimen Description SPUTUM   Final  Special Requests Immunocompromised   Final   Sputum evaluation     Final   Value: MICROSCOPIC FINDINGS SUGGEST THAT THIS SPECIMEN IS NOT REPRESENTATIVE OF LOWER RESPIRATORY SECRETIONS. PLEASE RECOLLECT.     Gram Stain Report Called to,Read Back By and Verified With: Pablo Lawrence RN 8:45 01/24/13 ([email or web address])   Report Status 01/24/2013 FINAL   Final  AFB CULTURE WITH SMEAR     Status: None   Collection Time    01/24/13  7:53 AM      Result Value Range Status   Specimen Description SPUTUM   Final   Special Requests ADD [id number]   Final   ACID FAST SMEAR NO ACID FAST BACILLI SEEN   Final   Culture     Final   Value: CULTURE WILL BE EXAMINED FOR 6 WEEKS BEFORE ISSUING A FINAL REPORT   Report Status PENDING   Incomplete  RESPIRATORY VIRUS PANEL     Status: None   Collection Time    01/24/13  8:18 AM      Result Value Range Status   Source - RVPAN     Corrected   Value: CORRECTED ON 06/26 AT 0848: PREVIOUSLY REPORTED AS NASAL SWAB   Comment: CORRECTED ON 06/27 AT 1808: PREVIOUSLY REPORTED AS NASOPHARYNGEAL, CORRECTED ON 06/26 AT 0848: PREVIOUSLY REPORTED AS NASAL SWAB   Respiratory Syncytial Virus A NOT DETECTED   Final   Respiratory Syncytial Virus B NOT DETECTED   Final   Influenza A NOT DETECTED   Final   Influenza B NOT DETECTED   Final   Parainfluenza 1 NOT DETECTED   Final   Parainfluenza 2 NOT DETECTED   Final   Parainfluenza 3 NOT DETECTED   Final   Metapneumovirus NOT DETECTED   Final   Rhinovirus NOT DETECTED   Final   Adenovirus NOT DETECTED   Final   Influenza A H1 NOT DETECTED   Final   Influenza A H3 NOT DETECTED   Final   Comment: (NOTE)           Normal Reference Range for each Analyte: NOT DETECTED     Testing performed using the Lum[employer] xTAG Respiratory Viral Panel test     kit.     This test was developed and its performance characteristics determined     by [employer]. It has  not been cleared or approved by the [place]     Food and Drug Administration. This test is used for clinical purposes.     It should not be regarded as investigational or for research. This     laboratory is certified under the Clinical Laboratory Improvement     Amendments of 1988 (CLIA) as qualified to perform high complexity     clinical laboratory testing.  AFB CULTURE WITH SMEAR     Status: None   Collection Time    01/24/13  6:18 PM      Result Value Range Status   Specimen Description SPUTUM   Final   Special Requests Immunocompromised   Final   ACID FAST SMEAR NO ACID FAST BACILLI SEEN   Final   Culture     Final   Value: CULTURE WILL BE EXAMINED FOR 6 WEEKS BEFORE ISSUING A FINAL REPORT   Report Status PENDING   Incomplete  AFB CULTURE WITH SMEAR     Status: None   Collection Time    01/25/13  2:27 PM      Result Value Range Status  Specimen Description SPUTUM   Final   Special Requests Immunocompromised   Final   ACID FAST SMEAR NO ACID FAST BACILLI SEEN   Final   Culture     Final   Value: CULTURE WILL BE EXAMINED FOR 6 WEEKS BEFORE ISSUING A FINAL REPORT   Report Status PENDING   Incomplete  CLOSTRIDIUM DIFFICILE BY PCR     Status: None   Collection Time    01/25/13  9:25 PM      Result Value Range Status   C difficile by pcr NEGATIVE  NEGATIVE Final  AFB CULTURE WITH SMEAR     Status: None   Collection Time    01/28/13  3:28 PM      Result Value Range Status   Specimen Description PLEURAL FLUID RIGHT   Final   Special Requests FLUID   Final   ACID FAST SMEAR NO ACID FAST BACILLI SEEN   Final   Culture     Final   Value: CULTURE WILL BE EXAMINED FOR 6 WEEKS BEFORE ISSUING A FINAL REPORT   Report Status PENDING   Incomplete  BODY FLUID CULTURE     Status: None   Collection Time    01/28/13  3:28 PM      Result Value Range Status   Specimen Description PLEURAL FLUID RIGHT   Final   Special Requests FLUID   Final   Gram Stain     Final   Value: NO WBC  SEEN     NO ORGANISMS SEEN   Culture NO GROWTH 2 DAYS   Final   Report Status PENDING   Incomplete  FUNGUS CULTURE W SMEAR     Status: None   Collection Time    01/28/13  3:28 PM      Result Value Range Status   Specimen Description FLUID PLEURAL RIGHT   Final   Special Requests 60CC   Final   Fungal Smear NO YEAST OR FUNGAL ELEMENTS SEEN   Final   Culture CULTURE IN PROGRESS FOR FOUR WEEKS   Final   Report Status PENDING   Incomplete  LEGIONELLA CULTURE     Status: None   Collection Time    01/31/13  3:12 PM      Result Value Range Status   Specimen Description BRONCHIAL WASHINGS   Final   Special Requests Normal   Final   Culture     Final   Value: NO LEGIONELLA ISOLATED, CULTURE IN PROGRESS FOR 5 DAYS   Report Status PENDING   Incomplete    Studies/Results: Dg Swallowing Func-speech Pathology  01/30/2013   Breck Coons [place], CCC-SLP     01/30/2013 10:23 AM Objective Swallowing Evaluation: Modified Barium Swallowing Study   Patient Details  Name: Alan Roman MRN: [id number] Date of Birth: [birth year]  Today's Date: 01/30/2013 Time: 0950-1005 SLP Time Calculation (min): 15 min  Past Medical History:  Past Medical History  Diagnosis Date  . Hypertension   . Arthritis    Past Surgical History:  Past Surgical History  Procedure Laterality Date  . Hernia repair     HPI:  67 y.o. male admitted with persistent fever s/p recurrent ABX.  CXR revealed right basilar density suspicious of pna. Pt. had  been on Humira for psoriasis. PMH + HTN, arthritis, and reports  occassioanl reflux for  which he takes OTC antacids. According to  pt, he has had hiatal hernia repair. Pt and daughter report  excessive belching after PO. Pt  denies h/o CVA, PD, neuro  changes.  BSE suspious for esophageal impairments.  MD wanted to  proceed with MBS.     Assessment / Plan / Recommendation Clinical Impression  Dysphagia Diagnosis: Within Functional Limits Clinical impression: Pt.s oropharyngeal swallow function was  WFL's.   Swallow initiation timely, no pharyngeal residual and  normal UES function.  Pill appeared to hesitate briefly at LES  prior to entering stomach (no radiologist present to confirm).   Loud belching following study 2-3 times.  Recommend continue  regular texture diet and thin liquids, straws ok, pill whole in  applesauce, remain sitting upright for 45 minutes after meals.   MD could consider further esophageal workup if appropriate.       Treatment Recommendation  No treatment recommended at this time    Diet Recommendation Regular;Thin liquid   Liquid Administration via: Cup;Straw Medication Administration: Whole meds with liquid Supervision: Patient able to self feed Compensations: Small sips/bites Postural Changes and/or Swallow Maneuvers: Upright 30-60 min  after meal;Seated upright 90 degrees    Other  Recommendations Recommended Consults: Consider GI  evaluation Oral Care Recommendations: Oral care BID   Follow Up Recommendations  None    Frequency and Duration        Pertinent Vitals/Pain none    SLP Swallow Goals Patient will consume recommended diet without observed clinical  signs of aspiration with: Independent assistance Swallow Study Goal #1 - Progress: Met      Reason for Referral Objectively evaluate swallowing function   Oral Phase Oral Preparation/Oral Phase Oral Phase: WFL   Pharyngeal Phase Pharyngeal Phase Pharyngeal Phase: Within functional limits  Cervical Esophageal Phase    GO  Breck Coons [place] M.Ed CCC-SLP Pager [phone]  01/30/2013    Cervical Esophageal Phase Cervical Esophageal Phase: WFL              Assessment/Plan: Alan Roman is a 67 y.o. 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

## 2013-02-01 NOTE — Progress Notes (Signed)
[facility] PROGRESS NOTE  Alan Roman HQI:[id number] DOB: 1945/09/12 DOA: 01/23/2013 PCP: Ignatius Specking., MD Brief history  67 year old gentleman with a history of psoriasis diagnosed 10 years ago, previously on prednisone, methotrexate, and Enbrel x 5 years. Most recently, the patient has been placed on a Humira for the past 2 years. His last dose was on 12/24/2012. The patient presented with 3 weeks of intermittent fevers ranging from 100.0-101.73F with a maximum of 102.73F associated with chills. The patient also endorsed a dry nonproductive cough and intermittent occipital headache. The patient was treated with amoxicillin by his primary care physician 2 weeks ago for bronchitis. He completed a course of antibiotics. He went to see another physician who diagnosed him with a UTI and started him on Bactrim. The patient continued to have fevers and fatigue. He also endorses a 7 pound weight loss in the past 3 weeks. The patient had 2 visits [place], most recently returned end of March 2014. He did not take malaria prophylaxis, but he stayed in screened rooms with airconditioning. No sick contacts. He drank bottled water. He was admitted to [facility] on 01/21/2013. He was started on ceftriaxone and acyclovir. His labs at [facility] show WBC 5.4, Hb 11.8, platelets 247. His differential showed slightly increased monocytes with 23.4 %. He also had hyponatremia of 128 in consecutive BMPs there. His ECHO did not show any evidence of vegetations but he had thickened MV with mild MR. His monospot test was negative.  He was transferred to [facility] on 01/23/13 do to FUO. Since admission, the patient has continued to have intermittent fevers. Infectious disease was consulted. Initial chest x-ray revealed right basilar density with parapneumonic effusion. The patient was started on ceftriaxone and azithromycin. His fevers persisted. Sputum AFB smears were negative x3 The patient had abnormal CT of the chest with "tree in  bud" opacity in RUL. Pulmonology was consulted. They recommended thoracocentesis. CT of the abdomen showed interstitial thickening of the right upper quadrant intraperitoneal space there was nonspecific. Thoracocentesis was performed on 01/28/2013. Fluid is exudative.  Assessment/Plan: FUO  -Working diagnosis is TB vs pneumonia suspect atypical  -off ceftriaxone/azithro--last dose 01/27/13 -restart yesterday per ID -QuantiFERON-TB-->positive  -followup AFB sputum--neg x3  -follow up malaria smear--neg  -fever curve is fluctuating. -repiratory viral panel neg  -Last dose of Humira was 12/24/12  -CT chest--"tree in bud" opacity RUL, L-subpleural nodules, scattered mediastinal LN, right pleural effusion  -appreciate Pulmonary consultation  -Stool for C. difficile PCR--neg  -CT abdomen and pelvis--no gallstones or gallbladder wall thickening. Interstitial thickening in the right upper quadrant intraperitoneal space, small ascites  -thoracocentesis (01/28/13)- 170cc cloudy fluid  -ordered pleural fluid for silver stain, AFB culture/strain, routine Gram stain/culture, cytology, cell count, LDH, protein, glucose  -Preliminary bacterial, AFB, and fungal cultures on pleural fluid negative  -HIV serology--neg -MTB PCR ordered on pleural fluid  Rocephin and azithromycin stopped per ID today. Patient s/p bronchoscopy yesterday per pulm. Patient  started on 4 drug TB therapy per ID yesterday. ID following and appreciate input and recommendations. Pleural effusion  -Exudative by light criteria  -340 WBCs--90% monocytes  -Await culture data  Cytology with abundant inflammatory cells. Bacteremia  -Likely to be contaminant  -d/c vancomycin  ?? Pneumonia  -01/23/2013 chest x-ray shows right basilar opacity with parapneumonic effusion  Transaminasemia  -stable  -likely due to ceftriaxone and azithromycin  -continue to monitor  Hyponatremia  -Likely volume depletion, poor solute intake  - normal  saline-->improving  -TSH 2.384  Hypertension  -  controlled  -hold amlodipine, blood pressure soft  weight loss, generalized weakness  -Physical therapy  -Check prealbumin--10.0  -Start nutritional supplement  Hypertension Resume home regimen of Norvasc.   Family Communication: Daughter at beside  Disposition Plan: Home when medically stable  Antibiotics:  Ceftriaxone 01/23/2013>>> 01/27/13----restart 01/29/13--->01/30/13 Azithromycin 01/23/2013>>>01/27/13----restart 01/29/13----> 01/30/13 Ethambutol 01/31/13  Isoniazid 01/31/2013 Pyrazinamide 01/31/2013 Rifampin 01/31/2013 Procedures:  --Thoracocentesis--01/28/13  Family Communication:   daughter at beside Disposition Plan:   Home when medically stable     Procedures/Studies: Dg Chest 1 View  01/28/2013   *RADIOLOGY REPORT*  Clinical Data: Status post right thoracentesis  CHEST - 1 VIEW  Comparison: CT chest dated 01/25/2013  Findings: No pneumothorax is seen status post right thoracentesis. Small right pleural effusion, improved.  Stable mild elevation of the right hemidiaphragm.  The heart is normal in size.  IMPRESSION: No pneumothorax status post right thoracentesis.  Small right pleural effusion, improved.   Original Report Authenticated By: Charline Bills, M.D.   Dg Chest 2 View  01/29/2013   *RADIOLOGY REPORT*  Clinical Data: Right lower lobe infiltrate with weakness cough and congestion.  History of hypertension.  Nonsmoker  CHEST - 2 VIEW  Comparison: 06/30 and 01/12/2013  Findings: A small right pleural effusion is again noted.  Heart and mediastinal contours are stable. Anterior eventration of the right hemidiaphragm is seen with some resultant associated basilar atelectasis.  No significant change in the appearance of the right lower lobe is otherwise identified in comparison with prior exam. No new focal infiltrates or signs of congestive failure are seen. No left pleural fluid is identified.  Bony structures are unchanged.   IMPRESSION: Small right pleural effusion, right basilar atelectasis and unchanged right basilar density.   Original Report Authenticated By: Rhodia Albright, M.D.   Dg Chest 2 View  01/23/2013   *RADIOLOGY REPORT*  Clinical Data: Fever.  Cough.  CHEST - 2 VIEW  Comparison: None.  Findings: Probable small right subpulmonic pleural effusion. Blunting of right costophrenic angle is present.  Right basilar volume loss/atelectasis.  Patchy airspace density extends to the right costophrenic angle suspicious for pneumonia.  Unchanged elevation of the right hemidiaphragm with right basilar atelectasis.  Small sclerotic focus of the posterior aspect of the T5 vertebra of these probably unchanged compared to 05/29/2012 allowing for differences in projection.  This may be associated with rib trauma or costovertebral osteoarthritis.  Follow-up to ensure radiographic clearing recommended.  Clearing is usually observed at 8 weeks.  IMPRESSION: Right basilar/right lower lobe airspace disease with probable small parapneumonic effusion.  Findings suspicious for pneumonia.   Original Report Authenticated By: Andreas Newport, M.D.   Ct Chest Wo Contrast  01/25/2013   *RADIOLOGY REPORT*  Clinical Data: Cough.  Shortness of breath.  Chest pain.  Probable pneumonia on recent imaging.  Immunocompromised patient.  CT CHEST WITHOUT CONTRAST  Technique:  Multidetector CT imaging of the chest was performed following the standard protocol without IV contrast.  Comparison: No prior chest CT.  Two-view chest x-ray 01/23/2013.  Findings: Respiratory motion blurred many of the images.  Large right pleural effusion with associated passive atelectasis in the right lower lobe.  No confluent airspace consolidation elsewhere. Tree in bud opacities in the inferior right upper lobe.  2 mm subpleural nodule in the inferior left upper lobe (series 3, image 24) and polygonal subpleural nodule in the superior segment left lower lobe adjacent to the  fissure (image 20).  No significant pulmonary parenchymal nodules.  No left pleural  effusion.  Central airways patent without significant bronchial wall thickening.  Heart size normal.  Prominent epicardial fat and prominent paracardiac fat pads.  Mild to moderate LAD coronary atherosclerosis.  No pericardial effusion.  Mild atherosclerosis involving the thoracic and upper abdominal aorta without aneurysm.  Numerous mediastinal lymph nodes which are borderline in size, the largest a station 4R node at the carina measuring approximately 1.5 x 1.2 cm.  No nodal masses.  Thyroid gland normal in appearance.  Very small hiatal hernia.  Inflammatory changes in the upper abdomen in the midline and right upper quadrant, incompletely imaged.  Bone window images demonstrate diffuse thoracic spondylosis.  IMPRESSION:  1.  Large right pleural effusion and associated passive atelectasis in the right lower lobe. 2.  Tree in bud opacities in the inferior right upper lobe which are consistent with infection/alveolitis. 3.  Inflammatory changes involving the upper abdomen in the midline and right upper quadrant.  These findings are incompletely imaged. Dedicated CT abdomen and pelvis may be helpful for further characterization. 4.  Scattered borderline mediastinal lymph nodes, likely reactive. 5.  Very small hiatal hernia.   Original Report Authenticated By: Hulan Saas, M.D.   Ct Abdomen Pelvis W Contrast  01/26/2013   *RADIOLOGY REPORT*  Clinical Data: Right upper quadrant pain and midline inflammatory process.  Hypertension and arthritis.  Fever of unknown origin. Immunocompromised state.  CT ABDOMEN AND PELVIS WITH CONTRAST  Technique:  Multidetector CT imaging of the abdomen and pelvis was performed following the standard protocol during bolus administration of intravenous contrast.  Contrast: OMNIPAQUE IOHEXOL 300 MG/ML  SOLN  Comparison: 03/08/2007 abdominal pelvic CT.  Chest CT of 1 day prior  Findings: Lung  bases:  Clear lung bases.  1.4 cm azygo-esophageal recess node.  Normal heart size with coronary artery atherosclerosis.  A small to moderate right pleural effusion is not significantly changed. Bilateral cardiophrenic angle nodes which are mildly enlarged.  Largest is on the left at 8 mm.  Abdomen/pelvis:  Normal appearance of the liver.  Old granulomas disease in the spleen.  Normal stomach, pancreas.  The gallbladder is incompletely distended.  No calcified stones or wall thickening. No biliary ductal dilatation.  Normal adrenal glands and kidneys.  Double IVC. No retroperitoneal or retrocrural adenopathy.  Scattered colonic diverticula.  Normal terminal ileum and appendix.  Normal small bowel caliber.  Small volume ascites is identified, including within both pericolic gutters and in the pelvic cul-de- sac.  There has been interval ventral abdominal wall hernia repair since 03/08/2007.  Interstitial thickening is identified within the right upper quadrant and extending into the anterior right abdomen. No definite localizing source is seen.  No well-defined peritoneal mass.  No inflamed adjacent bowel loops.  There are mildly prominent collateral veins in this region including on image 25/series 2.  No pelvic adenopathy.    Normal urinary bladder and prostate.  Bones/Musculoskeletal:  No acute osseous abnormality.  IMPRESSION:  1.  Similar moderate right-sided pleural effusion with lower thoracic adenopathy, indeterminate. 2.  Edema/interstitial thickening within the intraperitoneal space of the right upper quadrant.  No definite localizing source is seen.  There has been prior ventral hernia repair and there are prominent collateral veins in this region.  Question whether this interstitial thickening is postoperative.  Depending on the right upper quadrant symptomatology, potential clinical strategies of ultrasound to evaluate the gallbladder versus follow-up CT to confirm stability or resolution (i.e.  4 - 6  weeks). 3.  Small volume concurrent abdominal  pelvic ascites, indeterminate etiology. 4.  Double IVC.   Original Report Authenticated By: Jeronimo Greaves, M.D.   US Thoracentesis Asp Pleural Space W/img Guide  01/28/2013   *RADIOLOGY REPORT*  Clinical Data:  Right pleural effusion  ULTRASOUND GUIDED right THORACENTESIS  Comparison:  None  An ultrasound guided thoracentesis was thoroughly discussed with the patient and questions answered.  The benefits, risks, alternatives and complications were also discussed.  The patient understands and wishes to proceed with the procedure.  Written consent was obtained.  Ultrasound was performed to localize and mark an adequate pocket of fluid in the right chest.  The area was then prepped and draped in the normal sterile fashion.  1% Lidocaine was used for local anesthesia.  Under ultrasound guidance a 19 gauge Yueh catheter was introduced.  Thoracentesis was performed.  The catheter was removed and a dressing applied.  Complications:  None  Findings: A total of approximately 170 ml of cloudy yellow fluid was removed. A fluid sample was sent for laboratory analysis.  IMPRESSION: Successful ultrasound guided right thoracentesis yielding 170 ml of pleural fluid.  Read by: Ralene Muskrat, P.A.-C   Original Report Authenticated By: Tacey Ruiz, MD         Subjective:  Patient states slightly better than yesterday. Patient with fever last night.  Objective: Filed Vitals:   01/31/13 1953 01/31/13 2137 01/31/13 2202 02/01/13 0603  BP:  125/73  116/70  Pulse:  107  88  Temp: 100.2 F (37.9 C)  98.4 F (36.9 C) 98.6 F (37 C)  TempSrc:   Oral Oral  Resp:  20  18  Height:      Weight:    69.627 kg (153 lb 8 oz)  SpO2:  98%  99%    Intake/Output Summary (Last 24 hours) at 02/01/13 1029 Last data filed at 02/01/13 0849  Gross per 24 hour  Intake    460 ml  Output   2450 ml  Net  -1990 ml   Weight change: 0.136 kg (4.8 oz) Exam:   General:  Pt is alert,  follows commands appropriately, not in acute distress  HEENT: No icterus, No thrush, No neck mass, [place]/AT  Cardiovascular: RRR, S1/S2, no rubs, no gallops  Respiratory: right basilar crackles. No wheezes. Good air movement.   Abdomen: Soft/+BS, non tender, non distended, no guarding  Extremities: No edema, No lymphangitis, No petechiae, No rashes, no synovitis  Data Reviewed: Basic Metabolic Panel:  Recent Labs Lab 01/28/13 0545 01/29/13 0521 01/30/13 0521 01/31/13 0500 02/01/13 0530  NA 135 134* 133* 132* 132*  K 3.6 3.8 3.6 3.4* 3.8  CL 101 101 102 101 99  CO2 25 25 22 22 25   GLUCOSE 93 98 97 94 94  BUN 6 6 7 6 9   CREATININE 0.93 0.95 0.83 0.84 0.86  CALCIUM 7.8* 8.1* 7.9* 7.8* 7.9*   Liver Function Tests:  Recent Labs Lab 01/26/13 0435 01/27/13 0618 01/28/13 0545 01/29/13 0521  AST 60* 66* 54* 59*  ALT 53 60* 55* 62*  ALKPHOS 76 73 70 74  BILITOT 0.4 0.3 0.4 0.4  PROT 7.3 6.7 6.5 7.1  ALBUMIN 2.6* 2.4* 2.4* 2.5*   No results found for this basename: LIPASE, AMYLASE,  in the last 168 hours No results found for this basename: AMMONIA,  in the last 168 hours CBC:  Recent Labs Lab 01/27/13 0618 01/30/13 0521 01/31/13 0500  WBC 4.0 4.9 4.4  NEUTROABS 2.5 2.8  --   HGB  10.8* 11.2* 11.0*  HCT 30.7* 31.5* 31.5*  MCV 86.5 87.0 88.2  PLT 228 218 237   Cardiac Enzymes: No results found for this basename: CKTOTAL, CKMB, CKMBINDEX, TROPONINI,  in the last 168 hours BNP: No components found with this basename: POCBNP,  CBG: No results found for this basename: GLUCAP,  in the last 168 hours  Recent Results (from the past 240 hour(s))  URINE CULTURE     Status: None   Collection Time    01/23/13  2:13 PM      Result Value Range Status   Specimen Description URINE, RANDOM   Final   Special Requests Immunocompromised   Final   Culture  Setup Time 01/23/2013 18:38   Final   Colony Count NO GROWTH   Final   Culture NO GROWTH   Final   Report Status  01/24/2013 FINAL   Final  MALARIA SMEAR     Status: None   Collection Time    01/23/13 11:57 PM      Result Value Range Status   Specimen Description Blood   Final   Special Requests Immunocompromised   Final   Malaria Prep     Final   Value: No Plasmodium or Other Blood Parasites Seen on Thick or Thin Smears For persons strongly suspected of having a blood parasite,but have negative smears, it is recommended that blood films be repeated approximately every 12 to 24 hours for 3 consecutive      days.   Report Status 01/25/2013 FINAL   Final  CULTURE, BLOOD (ROUTINE X 2)     Status: None   Collection Time    01/23/13 11:58 PM      Result Value Range Status   Specimen Description BLOOD RIGHT ARM   Final   Special Requests BOTTLES DRAWN AEROBIC ONLY 10CC   Final   Culture  Setup Time 01/24/2013 05:06   Final   Culture NO GROWTH 5 DAYS   Final   Report Status 01/30/2013 FINAL   Final  CULTURE, BLOOD (ROUTINE X 2)     Status: None   Collection Time    01/23/13 11:58 PM      Result Value Range Status   Specimen Description BLOOD LEFT ARM   Final   Special Requests BOTTLES DRAWN AEROBIC ONLY 10CC   Final   Culture  Setup Time 01/24/2013 05:06   Final   Culture     Final   Value: STAPHYLOCOCCUS SPECIES (COAGULASE NEGATIVE)     Note: THE SIGNIFICANCE OF ISOLATING THIS ORGANISM FROM A SINGLE SET OF BLOOD CULTURES WHEN MULTIPLE SETS ARE DRAWN IS UNCERTAIN. PLEASE NOTIFY THE MICROBIOLOGY DEPARTMENT WITHIN ONE WEEK IF SPECIATION AND SENSITIVITIES ARE REQUIRED.     Note: Gram Stain Report Called to,Read Back By and Verified With: DALLAS HART AT 04:33 ON 01/25/2013   Report Status 01/26/2013 FINAL   Final  CULTURE, EXPECTORATED SPUTUM-ASSESSMENT     Status: None   Collection Time    01/24/13  7:53 AM      Result Value Range Status   Specimen Description SPUTUM   Final   Special Requests Immunocompromised   Final   Sputum evaluation     Final   Value: MICROSCOPIC FINDINGS SUGGEST THAT THIS  SPECIMEN IS NOT REPRESENTATIVE OF LOWER RESPIRATORY SECRETIONS. PLEASE RECOLLECT.     Gram Stain Report Called to,Read Back By and Verified With: Pablo Lawrence RN 8:45 01/24/13 ([email or web address])   Report Status 01/24/2013 FINAL   Final  AFB CULTURE WITH SMEAR     Status: None   Collection Time    01/24/13  7:53 AM      Result Value Range Status   Specimen Description SPUTUM   Final   Special Requests ADD [id number]   Final   ACID FAST SMEAR NO ACID FAST BACILLI SEEN   Final   Culture     Final   Value: CULTURE WILL BE EXAMINED FOR 6 WEEKS BEFORE ISSUING A FINAL REPORT   Report Status PENDING   Incomplete  RESPIRATORY VIRUS PANEL     Status: None   Collection Time    01/24/13  8:18 AM      Result Value Range Status   Source - RVPAN     Corrected   Value: CORRECTED ON 06/26 AT 0848: PREVIOUSLY REPORTED AS NASAL SWAB   Comment: CORRECTED ON 06/27 AT 1808: PREVIOUSLY REPORTED AS NASOPHARYNGEAL, CORRECTED ON 06/26 AT 0848: PREVIOUSLY REPORTED AS NASAL SWAB   Respiratory Syncytial Virus A NOT DETECTED   Final   Respiratory Syncytial Virus B NOT DETECTED   Final   Influenza A NOT DETECTED   Final   Influenza B NOT DETECTED   Final   Parainfluenza 1 NOT DETECTED   Final   Parainfluenza 2 NOT DETECTED   Final   Parainfluenza 3 NOT DETECTED   Final   Metapneumovirus NOT DETECTED   Final   Rhinovirus NOT DETECTED   Final   Adenovirus NOT DETECTED   Final   Influenza A H1 NOT DETECTED   Final   Influenza A H3 NOT DETECTED   Final   Comment: (NOTE)           Normal Reference Range for each Analyte: NOT DETECTED     Testing performed using the [employer] xTAG Respiratory Viral Panel test     kit.     This test was developed and its performance characteristics determined     by [employer]. It has not been cleared or approved by the [place]     Food and Drug Administration. This test is used for clinical purposes.     It should not be regarded as investigational or for research. This     laboratory is  certified under the Clinical Laboratory Improvement     Amendments of 1988 (CLIA) as qualified to perform high complexity     clinical laboratory testing.  AFB CULTURE WITH SMEAR     Status: None   Collection Time    01/24/13  6:18 PM      Result Value Range Status   Specimen Description SPUTUM   Final   Special Requests Immunocompromised   Final   ACID FAST SMEAR NO ACID FAST BACILLI SEEN   Final   Culture     Final   Value: CULTURE WILL BE EXAMINED FOR 6 WEEKS BEFORE ISSUING A FINAL REPORT   Report Status PENDING   Incomplete  AFB CULTURE WITH SMEAR     Status: None   Collection Time    01/25/13  2:27 PM      Result Value Range Status   Specimen Description SPUTUM   Final   Special Requests Immunocompromised   Final   ACID FAST SMEAR NO ACID FAST BACILLI SEEN   Final   Culture     Final   Value: CULTURE WILL BE EXAMINED FOR 6 WEEKS BEFORE ISSUING A FINAL REPORT   Report Status PENDING   Incomplete  CLOSTRIDIUM DIFFICILE BY  PCR     Status: None   Collection Time    01/25/13  9:25 PM      Result Value Range Status   C difficile by pcr NEGATIVE  NEGATIVE Final  AFB CULTURE WITH SMEAR     Status: None   Collection Time    01/28/13  3:28 PM      Result Value Range Status   Specimen Description PLEURAL FLUID RIGHT   Final   Special Requests FLUID   Final   ACID FAST SMEAR NO ACID FAST BACILLI SEEN   Final   Culture     Final   Value: CULTURE WILL BE EXAMINED FOR 6 WEEKS BEFORE ISSUING A FINAL REPORT   Report Status PENDING   Incomplete  BODY FLUID CULTURE     Status: None   Collection Time    01/28/13  3:28 PM      Result Value Range Status   Specimen Description PLEURAL FLUID RIGHT   Final   Special Requests FLUID   Final   Gram Stain     Final   Value: NO WBC SEEN     NO ORGANISMS SEEN   Culture NO GROWTH 2 DAYS   Final   Report Status PENDING   Incomplete  FUNGUS CULTURE W SMEAR     Status: None   Collection Time    01/28/13  3:28 PM      Result Value  Range Status   Specimen Description FLUID PLEURAL RIGHT   Final   Special Requests 60CC   Final   Fungal Smear NO YEAST OR FUNGAL ELEMENTS SEEN   Final   Culture CULTURE IN PROGRESS FOR FOUR WEEKS   Final   Report Status PENDING   Incomplete  LEGIONELLA CULTURE     Status: None   Collection Time    01/31/13  3:12 PM      Result Value Range Status   Specimen Description BRONCHIAL WASHINGS   Final   Special Requests Normal   Final   Culture     Final   Value: NO LEGIONELLA ISOLATED, CULTURE IN PROGRESS FOR 5 DAYS   Report Status PENDING   Incomplete     Scheduled Meds: . enoxaparin (LOVENOX) injection  40 mg Subcutaneous Q24H  . ethambutol  1,200 mg Oral Daily  . feeding supplement  1 Container Oral BID BM  . isoniazid  300 mg Oral Daily  . multivitamin with minerals  1 tablet Oral Daily  . pyrazinamide  1,500 mg Oral Daily  . vitamin B-6  50 mg Oral Daily  . rifampin  600 mg Oral Daily  . sodium chloride  3 mL Intravenous Q12H   Continuous Infusions: . sodium chloride 75 mL/hr at 02/01/13 0139     Jaquese Irving, MD  [facility] Pager [phone]  If 7PM-7AM, please contact night-coverage [email or web address] Password [email or web address] 02/01/2013, 10:29 AM   LOS: 9 days

## 2013-02-02 DIAGNOSIS — R7401 Elevation of levels of liver transaminase levels: Secondary | ICD-10-CM | POA: Diagnosis present

## 2013-02-02 DIAGNOSIS — A1889 Tuberculosis of other sites: Secondary | ICD-10-CM | POA: Diagnosis present

## 2013-02-02 HISTORY — DX: Tuberculosis of other sites: A18.89

## 2013-02-02 LAB — HEPATIC FUNCTION PANEL
ALT: 41 U/L (ref 0–53)
AST: 51 U/L — ABNORMAL HIGH (ref 0–37)
Albumin: 2.6 g/dL — ABNORMAL LOW (ref 3.5–5.2)
Alkaline Phosphatase: 78 U/L (ref 39–117)
Bilirubin, Direct: 0.3 mg/dL (ref 0.0–0.3)
Indirect Bilirubin: 0.7 mg/dL (ref 0.3–0.9)
Total Bilirubin: 1 mg/dL (ref 0.3–1.2)
Total Protein: 7.3 g/dL (ref 6.0–8.3)

## 2013-02-02 LAB — CBC
HCT: 31.1 % — ABNORMAL LOW (ref 39.0–52.0)
Hemoglobin: 11 g/dL — ABNORMAL LOW (ref 13.0–17.0)
MCH: 30.7 pg (ref 26.0–34.0)
MCHC: 35.4 g/dL (ref 30.0–36.0)
MCV: 86.9 fL (ref 78.0–100.0)
Platelets: 261 10*3/uL (ref 150–400)
RBC: 3.58 MIL/uL — ABNORMAL LOW (ref 4.22–5.81)
RDW: 12.6 % (ref 11.5–15.5)
WBC: 5 10*3/uL (ref 4.0–10.5)

## 2013-02-02 LAB — BASIC METABOLIC PANEL
BUN: 10 mg/dL (ref 6–23)
CO2: 23 mEq/L (ref 19–32)
Calcium: 8.2 mg/dL — ABNORMAL LOW (ref 8.4–10.5)
Chloride: 98 mEq/L (ref 96–112)
Creatinine, Ser: 0.84 mg/dL (ref 0.50–1.35)
GFR calc Af Amer: >90 mL/min (ref >90)
GFR calc non Af Amer: 88 mL/min — ABNORMAL LOW (ref >90)
Glucose, Bld: 97 mg/dL (ref 70–99)
Potassium: 3.4 mEq/L — ABNORMAL LOW (ref 3.5–5.1)
Sodium: 132 mEq/L — ABNORMAL LOW (ref 135–145)

## 2013-02-02 MED ORDER — HYDROCOD POLST-CHLORPHEN POLST 10-8 MG/5ML PO LQCR: 5.0000 mL | Freq: Two times a day (BID) | ORAL | Status: DC

## 2013-02-02 MED ORDER — PYRAZINAMIDE 500 MG PO TABS: 1500.0000 mg | ORAL_TABLET | Freq: Every day | ORAL | Status: DC

## 2013-02-02 MED ORDER — BOOST / RESOURCE BREEZE PO LIQD: 1.0000 | Freq: Two times a day (BID) | ORAL | Status: DC

## 2013-02-02 MED ORDER — PYRIDOXINE HCL 50 MG PO TABS: 50.0000 mg | ORAL_TABLET | Freq: Every day | ORAL | Status: DC

## 2013-02-02 MED ORDER — ETHAMBUTOL HCL 400 MG PO TABS: 1200.0000 mg | ORAL_TABLET | Freq: Every day | ORAL | Status: DC

## 2013-02-02 MED ORDER — POTASSIUM CHLORIDE CRYS ER 20 MEQ PO TBCR
40.0000 meq | EXTENDED_RELEASE_TABLET | Freq: Once | ORAL | Status: AC
  Administered 2013-02-02: 40 meq via ORAL
  Filled 2013-02-02: qty 2

## 2013-02-02 MED ORDER — ISONIAZID 300 MG PO TABS: 300.0000 mg | ORAL_TABLET | Freq: Every day | ORAL | Status: DC

## 2013-02-02 MED ORDER — RIFAMPIN 300 MG PO CAPS: 600.0000 mg | ORAL_CAPSULE | Freq: Every day | ORAL | Status: DC

## 2013-02-02 NOTE — Discharge Summary (Signed)
Physician Discharge Summary  Alan Roman ZOX:[id number] DOB: June 09, 1946 DOA: 01/23/2013  PCP: Ignatius Specking., MD  Admit date: 01/23/2013 Discharge date: 02/02/2013  Time spent: 60 minutes  Recommendations for Outpatient Follow-up:  1. Patient to followup with PCP one week post discharge. On followup a comprehensive metabolic profile need to be obtained to followup on patient's electrolytes renal function and liver enzymes. 2. Patient is to followup with Dr. Orvan Falconer of infectious disease in one to 2 weeks to followup on his treatment for presumed extrapulmonary TB. 3. Patient will also be contacted by the health department as outpatient for further management and treatment of his presumed extrapulmonary TB.  Discharge Diagnoses:  Principal Problem:   Extrapulmonary TB (tuberculosis) Active Problems:   Psoriasis   Fever, unknown origin   Immunocompromised state   Hyponatremia   Pleural effusion   Acute bronchiolitis due to other infectious organisms   Pulmonary infiltrate   Transaminitis   Discharge Condition: Stable and improved.  Diet recommendation: Regular.  Filed Weights   01/31/13 0507 02/01/13 0603 02/02/13 0505  Weight: 69.491 kg (153 lb 3.2 oz) 69.627 kg (153 lb 8 oz) 68.085 kg (150 lb 1.6 oz)    History of present illness:  Alan Roman is a 67 y.o. male with a hx of psoriasis on chronic Humera who initially presented to his PCP with fevers of over 101-102 F and a "scratchy" throat. The patient was empirically treated with Augmentin but the fevers persisted. The patient returned to see another PCP where he was given a course of Bactrim, again with no resolution of fevers. The patient was instructed to stop Humera and was subsequently admitted to [facility] for workup for fevers of unknown origin. At [facility], the patient was found to have a normal WBC count, albeit with a slightly elevated monocyte count. ESR was noted to be elevated at just under 80. The  patient was subsequently transferred to [facility] for continued workup. Of note, the patient did mention having a 5 lb wt loss in the past 3 weeks that he attributes to decreased appetite.   Hospital Course:  67 year old gentleman with a history of psoriasis diagnosed 10 years ago, previously on prednisone, methotrexate, and Enbrel x 5 years. Most recently, the patient has been placed on a Humira for the past 2 years. His last dose was on 12/24/2012. The patient presented with 3 weeks of intermittent fevers ranging from 100.0-101.81F with a maximum of 102.81F associated with chills. The patient also endorsed a dry nonproductive cough and intermittent occipital headache. The patient was treated with amoxicillin by his primary care physician 2 weeks ago for bronchitis. He completed a course of antibiotics. He went to see another physician who diagnosed him with a UTI and started him on Bactrim. The patient continued to have fevers and fatigue. He also endorses a 7 pound weight loss in the past 3 weeks. The patient had 2 visits [place], most recently returned end of March 2014. He did not take malaria prophylaxis, but he stayed in screened rooms with airconditioning. No sick contacts. He drank bottled water. He was admitted to [facility] on 01/21/2013. He was started on ceftriaxone and acyclovir. His labs at [facility] show WBC 5.4, Hb 11.8, platelets 247. His differential showed slightly increased monocytes with 23.4 %. He also had hyponatremia of 128 in consecutive BMPs there. His ECHO did not show any evidence of vegetations but he had thickened MV with mild MR. His monospot test was negative.  He was transferred  to [facility] on 01/23/13 do to FUO. Since admission, the patient has continued to have intermittent fevers. Infectious disease was consulted. Initial chest x-ray revealed right basilar density with parapneumonic effusion. The patient was started on ceftriaxone and azithromycin. His fevers persisted.  Sputum AFB smears were negative x3 The patient had abnormal CT of the chest with "tree in bud" opacity in RUL. Pulmonology was consulted. They recommended thoracocentesis. CT of the abdomen showed interstitial thickening of the right upper quadrant intraperitoneal space there was nonspecific. Thoracocentesis was performed on 01/28/2013. Fluid is exudative.  #1 fever of unknown origin Patient was admitted with a fever of unknown origin. As stated above he underwent a workup. Patient was subsequently transferred to [facility] 01/23/2013. ID consultation was obtained. Patient continued to have intermittent fevers. Initial chest x-ray revealed right basilar density with parapneumonic effusion. Patient was subsequently started empirically on IV Rocephin and azithromycin. AFB smears were obtained which were negative x3. CT of the chest which was done showed atrial bud opacity in the right upper lobe. Pulmonary was consulted patient subsequently underwent a thoracentesis which was exudative. TB quantity there was also done which came back positive. Malaria smear which was done was negative. Respiratory viral panel which was obtained was also negative. Patient's Humira was held. Rocephin and azithromycin was subsequently discontinued per ID recommendations. Pleural fluid which was obtained during thoracentesis for silver stain was negative, AFB culture was pending. Preliminary bacterial AFB and fungal cultures obtained on pleural fluid were also negative. HIV serology was negative. Patient subsequently underwent bronchoscopy on 01/31/2013 and specimens sent. Patient subsequently was started on treatment for presumed extrapulmonary TB. Patient was started on a 4 drug regimen and will followup with ID as outpatient. The health Department was to followup with the patient. On day of discharge patient had been afebrile x24 hours and was clinically improving. Patient will be discharged home in stable condition.  #2 pleural  effusion Patient was noted to have a pleural effusion. Patient underwent a thoracentesis and was noted to be an exudative effusion bilaterally criteria. Cytology which was done showed abundant inflammatory cells. Patient was initially treated with IV antibiotics which was subsequently discontinued. The patient was started on a 4 drug regimen. Bronchoscopy for presumed extrapulmonary tuberculosis. Patient will followup with PCP as outpatient as well as with ID as outpatient.  #3 transaminitis During the hospitalization patient was noted to have a transaminitis. This would need to be followed up as outpatient. Patient will likely benefit from acute hepatitis panel which may be done as outpatient. Will defer to patient's PCP.  #4 hyponatremia Admission patient was noted to be hyponatremic. TSH which was checked was within normal limits at 2.3 a fall. It was felt patient's hyponatremia was secondary to volume depletion. Patient was hydrated with IV fluids with resolution of his hyponatremia.  The rest of patient's chronic medical issues were stable throughout the hospitalization the patient be discharged back to in stable and improved condition. Procedures:  Thoracenthesis 01/11/2013  Bronchoscopy  01/31/13   Consultations:  ID: Dr Drue Second 01/23/13  Pulm: Dr Maple Hudson 01/26/13  Discharge Exam: Filed Vitals:   02/01/13 1837 02/01/13 2132 02/02/13 0505 02/02/13 1113  BP: 129/79 129/87 136/89 123/93  Pulse: 95 103 89 90  Temp: 99.2 F (37.3 C) 99.1 F (37.3 C) 98 F (36.7 C) 97.6 F (36.4 C)  TempSrc: Oral Oral Oral Oral  Resp:  18 18   Height:      Weight:   68.085 kg (150  lb 1.6 oz)   SpO2:  99% 99%     General: NAD Cardiovascular: RRR Respiratory: Some coarse BS diffusely.  Discharge Instructions      Discharge Orders   Future Orders Complete By Expires     Diet general  As directed     Discharge instructions  As directed     Comments:      Follow up with PCP in 1  week. Follow up with Dr Orvan Falconer in 1-2 weeks. Health dept will contact you.    Increase activity slowly  As directed         Medication List         amLODipine 5 MG tablet  Commonly known as:  NORVASC  Take 5 mg by mouth daily.     chlorpheniramine-HYDROcodone 10-8 MG/5ML Lqcr  Commonly known as:  TUSSIONEX PENNKINETIC ER  Take 5 mLs by mouth every 12 (twelve) hours. Take for 4 days then use as needed for cough.     ethambutol 400 MG tablet  Commonly known as:  MYAMBUTOL  Take 3 tablets (1,200 mg total) by mouth daily.     feeding supplement Liqd  Take 1 Container by mouth 2 (two) times daily between meals. otc     isoniazid 300 MG tablet  Commonly known as:  NYDRAZID  Take 1 tablet (300 mg total) by mouth daily.     pyrazinamide 500 MG tablet  Take 3 tablets (1,500 mg total) by mouth daily.     pyridOXINE 50 MG tablet  Commonly known as:  B-6  Take 1 tablet (50 mg total) by mouth daily.     rifampin 300 MG capsule  Commonly known as:  RIFADIN  Take 2 capsules (600 mg total) by mouth daily.       No Known Allergies Follow-up Information   Follow up with VYAS,DHRUV B., MD. Schedule an appointment as soon as possible for a visit in 1 week.   Contact information:   [address] [place] [address] [phone]       Follow up with Cliffton Asters, MD. Schedule an appointment as soon as possible for a visit in 2 weeks.   Contact information:   [address] [place] [address] [phone]       Follow up with [facility]PT [place] On 02/02/2013. (health dept will contact you.)    Contact information:   [address] [place] [address] [phone]       The results of significant diagnostics from this hospitalization (including imaging, microbiology, ancillary and laboratory) are listed below for reference.    Significant Diagnostic Studies: Dg Chest 1 View  01/28/2013   *RADIOLOGY REPORT*  Clinical Data: Status post right  thoracentesis  CHEST - 1 VIEW  Comparison: CT chest dated 01/25/2013  Findings: No pneumothorax is seen status post right thoracentesis. Small right pleural effusion, improved.  Stable mild elevation of the right hemidiaphragm.  The heart is normal in size.  IMPRESSION: No pneumothorax status post right thoracentesis.  Small right pleural effusion, improved.   Original Report Authenticated By: Charline Bills, M.D.   Dg Chest 2 View  01/29/2013   *RADIOLOGY REPORT*  Clinical Data: Right lower lobe infiltrate with weakness cough and congestion.  History of hypertension.  Nonsmoker  CHEST - 2 VIEW  Comparison: 06/30 and 01/12/2013  Findings: A small right pleural effusion is again noted.  Heart and mediastinal contours are stable. Anterior eventration of the right hemidiaphragm is  seen with some resultant associated basilar atelectasis.  No significant change in the appearance of the right lower lobe is otherwise identified in comparison with prior exam. No new focal infiltrates or signs of congestive failure are seen. No left pleural fluid is identified.  Bony structures are unchanged.  IMPRESSION: Small right pleural effusion, right basilar atelectasis and unchanged right basilar density.   Original Report Authenticated By: Rhodia Albright, M.D.   Dg Chest 2 View  01/23/2013   *RADIOLOGY REPORT*  Clinical Data: Fever.  Cough.  CHEST - 2 VIEW  Comparison: None.  Findings: Probable small right subpulmonic pleural effusion. Blunting of right costophrenic angle is present.  Right basilar volume loss/atelectasis.  Patchy airspace density extends to the right costophrenic angle suspicious for pneumonia.  Unchanged elevation of the right hemidiaphragm with right basilar atelectasis.  Small sclerotic focus of the posterior aspect of the T5 vertebra of these probably unchanged compared to 05/29/2012 allowing for differences in projection.  This may be associated with rib trauma or costovertebral osteoarthritis.   Follow-up to ensure radiographic clearing recommended.  Clearing is usually observed at 8 weeks.  IMPRESSION: Right basilar/right lower lobe airspace disease with probable small parapneumonic effusion.  Findings suspicious for pneumonia.   Original Report Authenticated By: Andreas Newport, M.D.   Ct Chest Wo Contrast  01/25/2013   *RADIOLOGY REPORT*  Clinical Data: Cough.  Shortness of breath.  Chest pain.  Probable pneumonia on recent imaging.  Immunocompromised patient.  CT CHEST WITHOUT CONTRAST  Technique:  Multidetector CT imaging of the chest was performed following the standard protocol without IV contrast.  Comparison: No prior chest CT.  Two-view chest x-ray 01/23/2013.  Findings: Respiratory motion blurred many of the images.  Large right pleural effusion with associated passive atelectasis in the right lower lobe.  No confluent airspace consolidation elsewhere. Tree in bud opacities in the inferior right upper lobe.  2 mm subpleural nodule in the inferior left upper lobe (series 3, image 24) and polygonal subpleural nodule in the superior segment left lower lobe adjacent to the fissure (image 20).  No significant pulmonary parenchymal nodules.  No left pleural effusion.  Central airways patent without significant bronchial wall thickening.  Heart size normal.  Prominent epicardial fat and prominent paracardiac fat pads.  Mild to moderate LAD coronary atherosclerosis.  No pericardial effusion.  Mild atherosclerosis involving the thoracic and upper abdominal aorta without aneurysm.  Numerous mediastinal lymph nodes which are borderline in size, the largest a station 4R node at the carina measuring approximately 1.5 x 1.2 cm.  No nodal masses.  Thyroid gland normal in appearance.  Very small hiatal hernia.  Inflammatory changes in the upper abdomen in the midline and right upper quadrant, incompletely imaged.  Bone window images demonstrate diffuse thoracic spondylosis.  IMPRESSION:  1.  Large right  pleural effusion and associated passive atelectasis in the right lower lobe. 2.  Tree in bud opacities in the inferior right upper lobe which are consistent with infection/alveolitis. 3.  Inflammatory changes involving the upper abdomen in the midline and right upper quadrant.  These findings are incompletely imaged. Dedicated CT abdomen and pelvis may be helpful for further characterization. 4.  Scattered borderline mediastinal lymph nodes, likely reactive. 5.  Very small hiatal hernia.   Original Report Authenticated By: Hulan Saas, M.D.   Ct Abdomen Pelvis W Contrast  01/26/2013   *RADIOLOGY REPORT*  Clinical Data: Right upper quadrant pain and midline inflammatory process.  Hypertension and arthritis.  Fever of  unknown origin. Immunocompromised state.  CT ABDOMEN AND PELVIS WITH CONTRAST  Technique:  Multidetector CT imaging of the abdomen and pelvis was performed following the standard protocol during bolus administration of intravenous contrast.  Contrast: OMNIPAQUE IOHEXOL 300 MG/ML  SOLN  Comparison: 03/08/2007 abdominal pelvic CT.  Chest CT of 1 day prior  Findings: Lung bases:  Clear lung bases.  1.4 cm azygo-esophageal recess node.  Normal heart size with coronary artery atherosclerosis.  A small to moderate right pleural effusion is not significantly changed. Bilateral cardiophrenic angle nodes which are mildly enlarged.  Largest is on the left at 8 mm.  Abdomen/pelvis:  Normal appearance of the liver.  Old granulomas disease in the spleen.  Normal stomach, pancreas.  The gallbladder is incompletely distended.  No calcified stones or wall thickening. No biliary ductal dilatation.  Normal adrenal glands and kidneys.  Double IVC. No retroperitoneal or retrocrural adenopathy.  Scattered colonic diverticula.  Normal terminal ileum and appendix.  Normal small bowel caliber.  Small volume ascites is identified, including within both pericolic gutters and in the pelvic cul-de- sac.  There has been  interval ventral abdominal wall hernia repair since 03/08/2007.  Interstitial thickening is identified within the right upper quadrant and extending into the anterior right abdomen. No definite localizing source is seen.  No well-defined peritoneal mass.  No inflamed adjacent bowel loops.  There are mildly prominent collateral veins in this region including on image 25/series 2.  No pelvic adenopathy.    Normal urinary bladder and prostate.  Bones/Musculoskeletal:  No acute osseous abnormality.  IMPRESSION:  1.  Similar moderate right-sided pleural effusion with lower thoracic adenopathy, indeterminate. 2.  Edema/interstitial thickening within the intraperitoneal space of the right upper quadrant.  No definite localizing source is seen.  There has been prior ventral hernia repair and there are prominent collateral veins in this region.  Question whether this interstitial thickening is postoperative.  Depending on the right upper quadrant symptomatology, potential clinical strategies of ultrasound to evaluate the gallbladder versus follow-up CT to confirm stability or resolution (i.e.  4 - 6 weeks). 3.  Small volume concurrent abdominal pelvic ascites, indeterminate etiology. 4.  Double IVC.   Original Report Authenticated By: Jeronimo Greaves, M.D.   Dg Swallowing Func-speech Pathology  01/30/2013   Breck Coons [place], CCC-SLP     01/30/2013 10:23 AM Objective Swallowing Evaluation: Modified Barium Swallowing Study   Patient Details  Name: Masiyah Engen MRN: [id number] Date of Birth: Jun 12, 1946  Today's Date: 01/30/2013 Time: 0950-1005 SLP Time Calculation (min): 15 min  Past Medical History:  Past Medical History  Diagnosis Date  . Hypertension   . Arthritis    Past Surgical History:  Past Surgical History  Procedure Laterality Date  . Hernia repair     HPI:  67 y.o. male admitted with persistent fever s/p recurrent ABX.  CXR revealed right basilar density suspicious of pna. Pt. had  been on Humira for psoriasis. PMH +  HTN, arthritis, and reports  occassioanl reflux for  which he takes OTC antacids. According to  pt, he has had hiatal hernia repair. Pt and daughter report  excessive belching after PO. Pt denies h/o CVA, PD, neuro  changes.  BSE suspious for esophageal impairments.  MD wanted to  proceed with MBS.     Assessment / Plan / Recommendation Clinical Impression  Dysphagia Diagnosis: Within Functional Limits Clinical impression: Pt.s oropharyngeal swallow function was  WFL's.  Swallow initiation timely, no pharyngeal residual and  normal UES function.  Pill appeared to hesitate briefly at LES  prior to entering stomach (no radiologist present to confirm).   Loud belching following study 2-3 times.  Recommend continue  regular texture diet and thin liquids, straws ok, pill whole in  applesauce, remain sitting upright for 45 minutes after meals.   MD could consider further esophageal workup if appropriate.       Treatment Recommendation  No treatment recommended at this time    Diet Recommendation Regular;Thin liquid   Liquid Administration via: Cup;Straw Medication Administration: Whole meds with liquid Supervision: Patient able to self feed Compensations: Small sips/bites Postural Changes and/or Swallow Maneuvers: Upright 30-60 min  after meal;Seated upright 90 degrees    Other  Recommendations Recommended Consults: Consider GI  evaluation Oral Care Recommendations: Oral care BID   Follow Up Recommendations  None    Frequency and Duration        Pertinent Vitals/Pain none    SLP Swallow Goals Patient will consume recommended diet without observed clinical  signs of aspiration with: Independent assistance Swallow Study Goal #1 - Progress: Met      Reason for Referral Objectively evaluate swallowing function   Oral Phase Oral Preparation/Oral Phase Oral Phase: WFL   Pharyngeal Phase Pharyngeal Phase Pharyngeal Phase: Within functional limits  Cervical Esophageal Phase    GO  Breck Coons [place] M.Ed CCC-SLP Pager [phone]   01/30/2013    Cervical Esophageal Phase Cervical Esophageal Phase: [facility]           US Thoracentesis Asp Pleural Space W/img Guide  01/28/2013   *RADIOLOGY REPORT*  Clinical Data:  Right pleural effusion  ULTRASOUND GUIDED right THORACENTESIS  Comparison:  None  An ultrasound guided thoracentesis was thoroughly discussed with the patient and questions answered.  The benefits, risks, alternatives and complications were also discussed.  The patient understands and wishes to proceed with the procedure.  Written consent was obtained.  Ultrasound was performed to localize and mark an adequate pocket of fluid in the right chest.  The area was then prepped and draped in the normal sterile fashion.  1% Lidocaine was used for local anesthesia.  Under ultrasound guidance a 19 gauge Yueh catheter was introduced.  Thoracentesis was performed.  The catheter was removed and a dressing applied.  Complications:  None  Findings: A total of approximately 170 ml of cloudy yellow fluid was removed. A fluid sample was sent for laboratory analysis.  IMPRESSION: Successful ultrasound guided right thoracentesis yielding 170 ml of pleural fluid.  Read by: Ralene Muskrat, P.A.-C   Original Report Authenticated By: Tacey Ruiz, MD    Microbiology: Recent Results (from the past 240 hour(s))  URINE CULTURE     Status: None   Collection Time    01/23/13  2:13 PM      Result Value Range Status   Specimen Description URINE, RANDOM   Final   Special Requests Immunocompromised   Final   Culture  Setup Time 01/23/2013 18:38   Final   Colony Count NO GROWTH   Final   Culture NO GROWTH   Final   Report Status 01/24/2013 FINAL   Final  MALARIA SMEAR     Status: None   Collection Time    01/23/13 11:57 PM      Result Value Range Status   Specimen Description Blood   Final   Special Requests Immunocompromised   Final   Malaria Prep     Final   Value: No  Plasmodium or Other Blood Parasites Seen on Thick or Thin Smears For persons  strongly suspected of having a blood parasite,but have negative smears, it is recommended that blood films be repeated approximately every 12 to 24 hours for 3 consecutive      days.   Report Status 01/25/2013 FINAL   Final  CULTURE, BLOOD (ROUTINE X 2)     Status: None   Collection Time    01/23/13 11:58 PM      Result Value Range Status   Specimen Description BLOOD RIGHT ARM   Final   Special Requests BOTTLES DRAWN AEROBIC ONLY 10CC   Final   Culture  Setup Time 01/24/2013 05:06   Final   Culture NO GROWTH 5 DAYS   Final   Report Status 01/30/2013 FINAL   Final  CULTURE, BLOOD (ROUTINE X 2)     Status: None   Collection Time    01/23/13 11:58 PM      Result Value Range Status   Specimen Description BLOOD LEFT ARM   Final   Special Requests BOTTLES DRAWN AEROBIC ONLY 10CC   Final   Culture  Setup Time 01/24/2013 05:06   Final   Culture     Final   Value: STAPHYLOCOCCUS SPECIES (COAGULASE NEGATIVE)     Note: THE SIGNIFICANCE OF ISOLATING THIS ORGANISM FROM A SINGLE SET OF BLOOD CULTURES WHEN MULTIPLE SETS ARE DRAWN IS UNCERTAIN. PLEASE NOTIFY THE MICROBIOLOGY DEPARTMENT WITHIN ONE WEEK IF SPECIATION AND SENSITIVITIES ARE REQUIRED.     Note: Gram Stain Report Called to,Read Back By and Verified With: DALLAS HART AT 04:33 ON 01/25/2013   Report Status 01/26/2013 FINAL   Final  CULTURE, EXPECTORATED SPUTUM-ASSESSMENT     Status: None   Collection Time    01/24/13  7:53 AM      Result Value Range Status   Specimen Description SPUTUM   Final   Special Requests Immunocompromised   Final   Sputum evaluation     Final   Value: MICROSCOPIC FINDINGS SUGGEST THAT THIS SPECIMEN IS NOT REPRESENTATIVE OF LOWER RESPIRATORY SECRETIONS. PLEASE RECOLLECT.     Gram Stain Report Called to,Read Back By and Verified With: Pablo Lawrence RN 8:45 01/24/13 ([email or web address])   Report Status 01/24/2013 FINAL   Final  AFB CULTURE WITH SMEAR     Status: None   Collection Time    01/24/13  7:53 AM      Result Value Range  Status   Specimen Description SPUTUM   Final   Special Requests ADD [id number]   Final   ACID FAST SMEAR NO ACID FAST BACILLI SEEN   Final   Culture     Final   Value: CULTURE WILL BE EXAMINED FOR 6 WEEKS BEFORE ISSUING A FINAL REPORT   Report Status PENDING   Incomplete  RESPIRATORY VIRUS PANEL     Status: None   Collection Time    01/24/13  8:18 AM      Result Value Range Status   Source - RVPAN     Corrected   Value: CORRECTED ON 06/26 AT 0848: PREVIOUSLY REPORTED AS NASAL SWAB   Comment: CORRECTED ON 06/27 AT 1808: PREVIOUSLY REPORTED AS NASOPHARYNGEAL, CORRECTED ON 06/26 AT 0848: PREVIOUSLY REPORTED AS NASAL SWAB   Respiratory Syncytial Virus A NOT DETECTED   Final   Respiratory Syncytial Virus B NOT DETECTED   Final   Influenza A NOT DETECTED   Final   Influenza B NOT DETECTED   Final  Parainfluenza 1 NOT DETECTED   Final   Parainfluenza 2 NOT DETECTED   Final   Parainfluenza 3 NOT DETECTED   Final   Metapneumovirus NOT DETECTED   Final   Rhinovirus NOT DETECTED   Final   Adenovirus NOT DETECTED   Final   Influenza A H1 NOT DETECTED   Final   Influenza A H3 NOT DETECTED   Final   Comment: (NOTE)           Normal Reference Range for each Analyte: NOT DETECTED     Testing performed using the Luminex xTAG Respiratory Viral Panel test     kit.     This test was developed and its performance characteristics determined     by [employer]. It has not been cleared or approved by the [place]     Food and Drug Administration. This test is used for clinical purposes.     It should not be regarded as investigational or for research. This     laboratory is certified under the Clinical Laboratory Improvement     Amendments of 1988 (CLIA) as qualified to perform high complexity     clinical laboratory testing.  AFB CULTURE WITH SMEAR     Status: None   Collection Time    01/24/13  6:18 PM      Result Value Range Status   Specimen Description SPUTUM   Final   Special Requests  Immunocompromised   Final   ACID FAST SMEAR NO ACID FAST BACILLI SEEN   Final   Culture     Final   Value: CULTURE WILL BE EXAMINED FOR 6 WEEKS BEFORE ISSUING A FINAL REPORT   Report Status PENDING   Incomplete  AFB CULTURE WITH SMEAR     Status: None   Collection Time    01/25/13  2:27 PM      Result Value Range Status   Specimen Description SPUTUM   Final   Special Requests Immunocompromised   Final   ACID FAST SMEAR NO ACID FAST BACILLI SEEN   Final   Culture     Final   Value: CULTURE WILL BE EXAMINED FOR 6 WEEKS BEFORE ISSUING A FINAL REPORT   Report Status PENDING   Incomplete  CLOSTRIDIUM DIFFICILE BY PCR     Status: None   Collection Time    01/25/13  9:25 PM      Result Value Range Status   C difficile by pcr NEGATIVE  NEGATIVE Final  AFB CULTURE WITH SMEAR     Status: None   Collection Time    01/28/13  3:28 PM      Result Value Range Status   Specimen Description PLEURAL FLUID RIGHT   Final   Special Requests FLUID   Final   ACID FAST SMEAR NO ACID FAST BACILLI SEEN   Final   Culture     Final   Value: CULTURE WILL BE EXAMINED FOR 6 WEEKS BEFORE ISSUING A FINAL REPORT   Report Status PENDING   Incomplete  BODY FLUID CULTURE     Status: None   Collection Time    01/28/13  3:28 PM      Result Value Range Status   Specimen Description PLEURAL FLUID RIGHT   Final   Special Requests FLUID   Final   Gram Stain     Final   Value: NO WBC SEEN     NO ORGANISMS SEEN   Culture NO GROWTH 3 DAYS  Final   Report Status 02/01/2013 FINAL   Final  FUNGUS CULTURE W SMEAR     Status: None   Collection Time    01/28/13  3:28 PM      Result Value Range Status   Specimen Description FLUID PLEURAL RIGHT   Final   Special Requests 60CC   Final   Fungal Smear NO YEAST OR FUNGAL ELEMENTS SEEN   Final   Culture CULTURE IN PROGRESS FOR FOUR WEEKS   Final   Report Status PENDING   Incomplete  AFB CULTURE WITH SMEAR     Status: None   Collection Time    01/31/13  3:12 PM       Result Value Range Status   Specimen Description BRONCHIAL WASHINGS   Final   Special Requests Normal   Final   ACID FAST SMEAR NO ACID FAST BACILLI SEEN   Final   Culture     Final   Value: CULTURE WILL BE EXAMINED FOR 6 WEEKS BEFORE ISSUING A FINAL REPORT   Report Status PENDING   Incomplete  LEGIONELLA CULTURE     Status: None   Collection Time    01/31/13  3:12 PM      Result Value Range Status   Specimen Description BRONCHIAL WASHINGS   Final   Special Requests Normal   Final   Culture     Final   Value: NO LEGIONELLA ISOLATED, CULTURE IN PROGRESS FOR 5 DAYS   Report Status PENDING   Incomplete  PNEUMOCYSTIS JIROVECI SMEAR BY DFA     Status: None   Collection Time    01/31/13  3:12 PM      Result Value Range Status   Specimen Source-[facility] BRONCHIAL WASHINGS   Corrected   Comment: CORRECTED ON 07/03 AT 1626: PREVIOUSLY REPORTED AS BRONCHIAL ALVEOLAR LAVAGE   Pneumocystis jiroveci Ag NEGATIVE   Final   Comment: Performed at [facility]     Labs: Basic Metabolic Panel:  Recent Labs Lab 01/29/13 0521 01/30/13 0521 01/31/13 0500 02/01/13 0530 02/02/13 0550  NA 134* 133* 132* 132* 132*  K 3.8 3.6 3.4* 3.8 3.4*  CL 101 102 101 99 98  CO2 25 22 22 25 23   GLUCOSE 98 97 94 94 97  BUN 6 7 6 9 10   CREATININE 0.95 0.83 0.84 0.86 0.84  CALCIUM 8.1* 7.9* 7.8* 7.9* 8.2*   Liver Function Tests:  Recent Labs Lab 01/27/13 0618 01/28/13 0545 01/29/13 0521 02/02/13 0550  AST 66* 54* 59* 51*  ALT 60* 55* 62* 41  ALKPHOS 73 70 74 78  BILITOT 0.3 0.4 0.4 1.0  PROT 6.7 6.5 7.1 7.3  ALBUMIN 2.4* 2.4* 2.5* 2.6*   No results found for this basename: LIPASE, AMYLASE,  in the last 168 hours No results found for this basename: AMMONIA,  in the last 168 hours CBC:  Recent Labs Lab 01/27/13 0618 01/30/13 0521 01/31/13 0500 02/02/13 0550  WBC 4.0 4.9 4.4 5.0  NEUTROABS 2.5 2.8  --   --   HGB 10.8* 11.2* 11.0* 11.0*  HCT 30.7* 31.5* 31.5* 31.1*  MCV  86.5 87.0 88.2 86.9  PLT 228 218 237 261   Cardiac Enzymes: No results found for this basename: CKTOTAL, CKMB, CKMBINDEX, TROPONINI,  in the last 168 hours BNP: BNP (last 3 results) No results found for this basename: PROBNP,  in the last 8760 hours CBG: No results found for this basename: GLUCAP,  in the last 168 hours  Signed:  Yosgar Demirjian  [facility] 02/02/2013, 12:22 PM

## 2013-02-02 NOTE — Progress Notes (Signed)
[facility]    Subjective: Has had right sided abdominal pain for several weeks  Antibiotics:  Anti-infectives   Start     Dose/Rate Route Frequency Ordered Stop   01/31/13 1800  isoniazid (NYDRAZID) tablet 300 mg     300 mg Oral Daily 01/31/13 1108     01/31/13 1800  rifampin (RIFADIN) capsule 600 mg     600 mg Oral Daily 01/31/13 1108     01/31/13 1800  pyrazinamide tablet 1,500 mg     1,500 mg Oral Daily 01/31/13 1108     01/31/13 1800  ethambutol (MYAMBUTOL) tablet 1,200 mg     1,200 mg Oral Daily 01/31/13 1108     01/29/13 1745  cefTRIAXone (ROCEPHIN) 1 g in dextrose 5 % 50 mL IVPB  Status:  Discontinued     1 g 100 mL/hr over 30 Minutes Intravenous Every 24 hours 01/29/13 1742 01/30/13 1427   01/29/13 1745  azithromycin (ZITHROMAX) tablet 500 mg  Status:  Discontinued     500 mg Oral Daily 01/29/13 1742 01/30/13 1427   01/25/13 1800  vancomycin (VANCOCIN) IVPB 750 mg/150 ml premix  Status:  Discontinued     750 mg 150 mL/hr over 60 Minutes Intravenous Every 12 hours 01/25/13 0635 01/26/13 1304   01/25/13 0700  vancomycin (VANCOCIN) 1,250 mg in sodium chloride 0.9 % 250 mL IVPB     1,250 mg 166.7 mL/hr over 90 Minutes Intravenous  Once 01/25/13 0635 01/25/13 1006   01/23/13 2115  cefTRIAXone (ROCEPHIN) 1 g in dextrose 5 % 50 mL IVPB     1 g 100 mL/hr over 30 Minutes Intravenous Every 24 hours 01/23/13 2110 01/28/13 1759   01/23/13 2115  azithromycin (ZITHROMAX) 500 mg in dextrose 5 % 250 mL IVPB     500 mg 250 mL/hr over 60 Minutes Intravenous Every 24 hours 01/23/13 2110 01/28/13 1759      Medications: Scheduled Meds: . amLODipine  5 mg Oral Daily  . enoxaparin (LOVENOX) injection  40 mg Subcutaneous Q24H  . ethambutol  1,200 mg Oral Daily  . feeding supplement  1 Container Oral BID BM  . isoniazid  300 mg Oral Daily  . multivitamin with minerals  1 tablet Oral Daily  . potassium chloride  40 mEq Oral Once  . pyrazinamide  1,500 mg  Oral Daily  . vitamin B-6  50 mg Oral Daily  . rifampin  600 mg Oral Daily  . sodium chloride  3 mL Intravenous Q12H   Continuous Infusions:   PRN Meds:.acetaminophen, cyclobenzaprine, fentaNYL, guaiFENesin-dextromethorphan, lidocaine (PF), lidocaine, menthol-cetylpyridinium, midazolam, phenylephrine   Objective: Weight change: -3 lb 6.4 oz (-1.542 kg)  Intake/Output Summary (Last 24 hours) at 02/02/13 0846 Last data filed at 02/01/13 2136  Gross per 24 hour  Intake    460 ml  Output   1300 ml  Net   -840 ml   Blood pressure 136/89, pulse 89, temperature 98 F (36.7 C), temperature source Oral, resp. rate 18, height 5\' 7"  (1.702 m), weight 150 lb 1.6 oz (68.085 kg), SpO2 99.00%. Temp:  [98 F (36.7 C)-99.2 F (37.3 C)] 98 F (36.7 C) (07/05 0505) Pulse Rate:  [82-103] 89 (07/05 0505) Resp:  [18] 18 (07/05 0505) BP: (129-146)/(73-89) 136/89 mmHg (07/05 0505) SpO2:  [99 %] 99 % (07/05 0505) Weight:  [150 lb 1.6 oz (68.085 kg)] 150 lb 1.6 oz (68.085 kg) (07/05 0505)  Physical Exam: General: Alert and awake, oriented x3, not  in any acute distress. HEENT: anicteric sclera, pupils reactive to light and accommodation, EOMI CVS regular rate, normal r,  no murmur rubs or gallops Chest: decreased breath sounds on the right side, otherwise clear to auscultation bilaterally, no wheezing, rales or rhonchi Abdomen: soft minimally tender RUQ,  nondistended, normal bowel sounds, Extremities: no  clubbing or edema noted bilaterally Skin: no rashes Neuro: nonfocal  Lab Results:  Recent Labs  01/31/13 0500 02/02/13 0550  WBC 4.4 5.0  HGB 11.0* 11.0*  HCT 31.5* 31.1*  PLT 237 261    BMET  Recent Labs  02/01/13 0530 02/02/13 0550  NA 132* 132*  K 3.8 3.4*  CL 99 98  CO2 25 23  GLUCOSE 94 97  BUN 9 10  CREATININE 0.86 0.84  CALCIUM 7.9* 8.2*    Micro Results: Recent Results (from the past 240 hour(s))  URINE CULTURE     Status: None   Collection Time    01/23/13   2:13 PM      Result Value Range Status   Specimen Description URINE, RANDOM   Final   Special Requests Immunocompromised   Final   Culture  Setup Time 01/23/2013 18:38   Final   Colony Count NO GROWTH   Final   Culture NO GROWTH   Final   Report Status 01/24/2013 FINAL   Final  MALARIA SMEAR     Status: None   Collection Time    01/23/13 11:57 PM      Result Value Range Status   Specimen Description Blood   Final   Special Requests Immunocompromised   Final   Malaria Prep     Final   Value: No Plasmodium or Other Blood Parasites Seen on Thick or Thin Smears For persons strongly suspected of having a blood parasite,but have negative smears, it is recommended that blood films be repeated approximately every 12 to 24 hours for 3 consecutive      days.   Report Status 01/25/2013 FINAL   Final  CULTURE, BLOOD (ROUTINE X 2)     Status: None   Collection Time    01/23/13 11:58 PM      Result Value Range Status   Specimen Description BLOOD RIGHT ARM   Final   Special Requests BOTTLES DRAWN AEROBIC ONLY 10CC   Final   Culture  Setup Time 01/24/2013 05:06   Final   Culture NO GROWTH 5 DAYS   Final   Report Status 01/30/2013 FINAL   Final  CULTURE, BLOOD (ROUTINE X 2)     Status: None   Collection Time    01/23/13 11:58 PM      Result Value Range Status   Specimen Description BLOOD LEFT ARM   Final   Special Requests BOTTLES DRAWN AEROBIC ONLY 10CC   Final   Culture  Setup Time 01/24/2013 05:06   Final   Culture     Final   Value: STAPHYLOCOCCUS SPECIES (COAGULASE NEGATIVE)     Note: THE SIGNIFICANCE OF ISOLATING THIS ORGANISM FROM A SINGLE SET OF BLOOD CULTURES WHEN MULTIPLE SETS ARE DRAWN IS UNCERTAIN. PLEASE NOTIFY THE MICROBIOLOGY DEPARTMENT WITHIN ONE WEEK IF SPECIATION AND SENSITIVITIES ARE REQUIRED.     Note: Gram Stain Report Called to,Read Back By and Verified With: DALLAS HART AT 04:33 ON 01/25/2013   Report Status 01/26/2013 FINAL   Final  CULTURE, EXPECTORATED SPUTUM-ASSESSMENT      Status: None   Collection Time    01/24/13  7:53 AM  Result Value Range Status   Specimen Description SPUTUM   Final   Special Requests Immunocompromised   Final   Sputum evaluation     Final   Value: MICROSCOPIC FINDINGS SUGGEST THAT THIS SPECIMEN IS NOT REPRESENTATIVE OF LOWER RESPIRATORY SECRETIONS. PLEASE RECOLLECT.     Gram Stain Report Called to,Read Back By and Verified With: Pablo Lawrence RN 8:45 01/24/13 ([email or web address])   Report Status 01/24/2013 FINAL   Final  AFB CULTURE WITH SMEAR     Status: None   Collection Time    01/24/13  7:53 AM      Result Value Range Status   Specimen Description SPUTUM   Final   Special Requests ADD [id number]   Final   ACID FAST SMEAR NO ACID FAST BACILLI SEEN   Final   Culture     Final   Value: CULTURE WILL BE EXAMINED FOR 6 WEEKS BEFORE ISSUING A FINAL REPORT   Report Status PENDING   Incomplete  RESPIRATORY VIRUS PANEL     Status: None   Collection Time    01/24/13  8:18 AM      Result Value Range Status   Source - [facility]     Corrected   Value: CORRECTED ON 06/26 AT 0848: PREVIOUSLY REPORTED AS NASAL SWAB   Comment: CORRECTED ON 06/27 AT 1808: PREVIOUSLY REPORTED AS NASOPHARYNGEAL, CORRECTED ON 06/26 AT 0848: PREVIOUSLY REPORTED AS NASAL SWAB   Respiratory Syncytial Virus A NOT DETECTED   Final   Respiratory Syncytial Virus B NOT DETECTED   Final   Influenza A NOT DETECTED   Final   Influenza B NOT DETECTED   Final   Parainfluenza 1 NOT DETECTED   Final   Parainfluenza 2 NOT DETECTED   Final   Parainfluenza 3 NOT DETECTED   Final   Metapneumovirus NOT DETECTED   Final   Rhinovirus NOT DETECTED   Final   Adenovirus NOT DETECTED   Final   Influenza A H1 NOT DETECTED   Final   Influenza A H3 NOT DETECTED   Final   Comment: (NOTE)           Normal Reference Range for each Analyte: NOT DETECTED     Testing performed using the Luminex xTAG Respiratory Viral Panel test     kit.     This test was developed and its performance characteristics  determined     by [employer]. It has not been cleared or approved by the [place]     Food and Drug Administration. This test is used for clinical purposes.     It should not be regarded as investigational or for research. This     laboratory is certified under the Clinical Laboratory Improvement     Amendments of 1988 (CLIA) as qualified to perform high complexity     clinical laboratory testing.  AFB CULTURE WITH SMEAR     Status: None   Collection Time    01/24/13  6:18 PM      Result Value Range Status   Specimen Description SPUTUM   Final   Special Requests Immunocompromised   Final   ACID FAST SMEAR NO ACID FAST BACILLI SEEN   Final   Culture     Final   Value: CULTURE WILL BE EXAMINED FOR 6 WEEKS BEFORE ISSUING A FINAL REPORT   Report Status PENDING   Incomplete  AFB CULTURE WITH SMEAR     Status: None   Collection Time  01/25/13  2:27 PM      Result Value Range Status   Specimen Description SPUTUM   Final   Special Requests Immunocompromised   Final   ACID FAST SMEAR NO ACID FAST BACILLI SEEN   Final   Culture     Final   Value: CULTURE WILL BE EXAMINED FOR 6 WEEKS BEFORE ISSUING A FINAL REPORT   Report Status PENDING   Incomplete  CLOSTRIDIUM DIFFICILE BY PCR     Status: None   Collection Time    01/25/13  9:25 PM      Result Value Range Status   C difficile by pcr NEGATIVE  NEGATIVE Final  AFB CULTURE WITH SMEAR     Status: None   Collection Time    01/28/13  3:28 PM      Result Value Range Status   Specimen Description PLEURAL FLUID RIGHT   Final   Special Requests FLUID   Final   ACID FAST SMEAR NO ACID FAST BACILLI SEEN   Final   Culture     Final   Value: CULTURE WILL BE EXAMINED FOR 6 WEEKS BEFORE ISSUING A FINAL REPORT   Report Status PENDING   Incomplete  BODY FLUID CULTURE     Status: None   Collection Time    01/28/13  3:28 PM      Result Value Range Status   Specimen Description PLEURAL FLUID RIGHT   Final   Special Requests FLUID    Final   Gram Stain     Final   Value: NO WBC SEEN     NO ORGANISMS SEEN   Culture NO GROWTH 3 DAYS   Final   Report Status 02/01/2013 FINAL   Final  FUNGUS CULTURE W SMEAR     Status: None   Collection Time    01/28/13  3:28 PM      Result Value Range Status   Specimen Description FLUID PLEURAL RIGHT   Final   Special Requests 60CC   Final   Fungal Smear NO YEAST OR FUNGAL ELEMENTS SEEN   Final   Culture CULTURE IN PROGRESS FOR FOUR WEEKS   Final   Report Status PENDING   Incomplete  AFB CULTURE WITH SMEAR     Status: None   Collection Time    01/31/13  3:12 PM      Result Value Range Status   Specimen Description BRONCHIAL WASHINGS   Final   Special Requests Normal   Final   ACID FAST SMEAR NO ACID FAST BACILLI SEEN   Final   Culture     Final   Value: CULTURE WILL BE EXAMINED FOR 6 WEEKS BEFORE ISSUING A FINAL REPORT   Report Status PENDING   Incomplete  LEGIONELLA CULTURE     Status: None   Collection Time    01/31/13  3:12 PM      Result Value Range Status   Specimen Description BRONCHIAL WASHINGS   Final   Special Requests Normal   Final   Culture     Final   Value: NO LEGIONELLA ISOLATED, CULTURE IN PROGRESS FOR 5 DAYS   Report Status PENDING   Incomplete  PNEUMOCYSTIS JIROVECI SMEAR BY DFA     Status: None   Collection Time    01/31/13  3:12 PM      Result Value Range Status   Specimen Source-[facility] BRONCHIAL WASHINGS   Corrected   Comment: CORRECTED ON 07/03 AT 1626: PREVIOUSLY REPORTED AS BRONCHIAL  ALVEOLAR LAVAGE   Pneumocystis jiroveci Ag NEGATIVE   Final   Comment: Performed at [facility]    Studies/Results: No results found.    Assessment/Plan: Alan Roman is a 67 y.o. 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[facility] starting Monday--I will get in touch with Ward Roxan Hockey, MD to arrange for fu with them and he can also followup with [place] as well , HIV is negative --he will need close monitoring of LFTS since they were abnormal prior to starting his MTB therapy  #2 Transaminitis: not sure cause of this, would check him for Hepatitis panel (but does not need to stay for this test()  --I am also adding on lfts from todays draw post starting mtb therapy  We will also plan on him fu with [place] in [facility] in next 2 week   LOS: 10 days   Acey Lav 02/02/2013, 8:46 AM

## 2013-02-03 LAB — M. TUBERCULOSIS COMPLEX BY PCR: M. tuberculosis, Direct: NOT DETECTED

## 2013-02-03 LAB — CULTURE, RESPIRATORY W GRAM STAIN: Gram Stain: NONE SEEN

## 2013-02-03 LAB — CULTURE, RESPIRATORY

## 2013-02-04 LAB — MISCELLANEOUS TEST

## 2013-02-05 LAB — MISCELLANEOUS TEST

## 2013-02-06 LAB — LEGIONELLA CULTURE: Special Requests: NORMAL

## 2013-02-08 ENCOUNTER — Other Ambulatory Visit: Payer: Self-pay | Admitting: Infectious Diseases

## 2013-02-08 ENCOUNTER — Ambulatory Visit
Admission: RE | Admit: 2013-02-08 | Discharge: 2013-02-08 | Disposition: A | Discharge status: Home / Self Care | Payer: No Typology Code available for payment source | Source: Ambulatory Visit | Attending: Infectious Diseases | Admitting: Infectious Diseases

## 2013-02-08 DIAGNOSIS — R0602 Shortness of breath: Secondary | ICD-10-CM

## 2013-02-08 LAB — MISCELLANEOUS TEST: Miscellaneous Test: 12200

## 2013-02-18 ENCOUNTER — Inpatient Hospital Stay: Payer: Medicare Other | Admitting: Internal Medicine

## 2013-02-20 ENCOUNTER — Encounter: Payer: Self-pay | Admitting: Internal Medicine

## 2013-02-20 ENCOUNTER — Ambulatory Visit (INDEPENDENT_AMBULATORY_CARE_PROVIDER_SITE_OTHER): Payer: Medicare Other | Admitting: Internal Medicine

## 2013-02-20 VITALS — BP 141/92 | HR 94 | Temp 98.3°F | Ht 67.0 in | Wt 150.0 lb

## 2013-02-20 DIAGNOSIS — R918 Other nonspecific abnormal finding of lung field: Secondary | ICD-10-CM

## 2013-02-20 DIAGNOSIS — A1889 Tuberculosis of other sites: Secondary | ICD-10-CM

## 2013-02-22 ENCOUNTER — Encounter: Payer: Self-pay | Admitting: Internal Medicine

## 2013-02-22 NOTE — Assessment & Plan Note (Signed)
Negative cultures to date and being ruled out by health department.

## 2013-02-22 NOTE — Assessment & Plan Note (Addendum)
No etiology identified.  He is overall better.  He may benefit from pulmonary outpatient evaluation and will pursue with his PCP.  No infectious etiology identified and no indication for other antibiotics if/when Tb ruled out.

## 2013-02-22 NOTE — Progress Notes (Signed)
  Subjective:    Patient ID: Alan Roman, male    DOB: [birth year], 67 y.o.   MRN: [id number]  HPI He comes in for hospital follow up. He has rheumatoid arthritis and had been on Humira.  He has a history of untreated latent Tb and in June developed a fever.  No etiology identified but he did have respiratory distress, cough and has been evaluated for possible Tb.  Cultures to date remain negative and all AFB smears were negative.  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  He overall feels better, no fever, some SOB and cough, though difficult to tell if this is deconditioning or pulmonary.  Recent CXR with little pleural effusion on left.  Appetite better.  No diarrhea, no abdominal pain.     Review of Systems  Constitutional: Positive for activity change, appetite change and fatigue. Negative for fever, chills and unexpected weight change.  HENT: Negative for sore throat.   Respiratory: Positive for cough and shortness of breath. Negative for wheezing.   Cardiovascular: Negative for chest pain and leg swelling.  Gastrointestinal: Negative for nausea, abdominal pain and diarrhea.  Musculoskeletal: Positive for arthralgias. Negative for myalgias.  Skin: Negative for rash.  Neurological: Negative for dizziness, light-headedness and headaches.  Hematological: Negative for adenopathy.  Psychiatric/Behavioral: Negative for dysphoric mood.       Objective:   Physical Exam  Constitutional: No distress.  thin  HENT:  Mouth/Throat: No oropharyngeal exudate.  Eyes: Right eye exhibits no discharge. Left eye exhibits no discharge. No scleral icterus.  Cardiovascular: Normal rate, regular rhythm and normal heart sounds.   No murmur heard. Pulmonary/Chest: Effort normal and breath sounds normal. No respiratory distress. He has no wheezes.  Lymphadenopathy:    He has no cervical adenopathy.  Skin: Skin is warm and dry. No rash noted.  Psychiatric: He has a  normal mood and affect. His behavior is normal.          Assessment & Plan:

## 2013-02-25 LAB — FUNGUS CULTURE W SMEAR: Fungal Smear: NONE SEEN

## 2013-02-27 LAB — FUNGUS CULTURE W SMEAR
Fungal Smear: NONE SEEN
Special Requests: NORMAL

## 2013-03-06 ENCOUNTER — Other Ambulatory Visit: Payer: Self-pay

## 2013-03-08 LAB — AFB CULTURE WITH SMEAR (NOT AT ARMC)
Acid Fast Smear: NONE SEEN
Acid Fast Smear: NONE SEEN

## 2013-03-10 LAB — AFB CULTURE WITH SMEAR (NOT AT ARMC): Acid Fast Smear: NONE SEEN

## 2013-03-14 LAB — AFB CULTURE WITH SMEAR (NOT AT ARMC): Acid Fast Smear: NONE SEEN

## 2013-03-15 LAB — AFB CULTURE WITH SMEAR (NOT AT ARMC)
Acid Fast Smear: NONE SEEN
Special Requests: NORMAL

## 2013-04-22 ENCOUNTER — Other Ambulatory Visit: Payer: Self-pay | Admitting: Infectious Disease

## 2013-04-22 ENCOUNTER — Ambulatory Visit
Admission: RE | Admit: 2013-04-22 | Discharge: 2013-04-22 | Disposition: A | Discharge status: Home / Self Care | Payer: No Typology Code available for payment source | Source: Ambulatory Visit | Attending: Infectious Disease | Admitting: Infectious Disease

## 2013-04-22 DIAGNOSIS — R7611 Nonspecific reaction to tuberculin skin test without active tuberculosis: Secondary | ICD-10-CM

## 2013-06-06 ENCOUNTER — Other Ambulatory Visit: Payer: Self-pay

## 2013-09-05 ENCOUNTER — Ambulatory Visit
Admission: RE | Admit: 2013-09-05 | Discharge: 2013-09-05 | Disposition: A | Discharge status: Home / Self Care | Payer: No Typology Code available for payment source | Source: Ambulatory Visit | Attending: Infectious Disease | Admitting: Infectious Disease

## 2013-09-05 ENCOUNTER — Other Ambulatory Visit: Payer: Self-pay | Admitting: Infectious Disease

## 2013-09-05 DIAGNOSIS — A150 Tuberculosis of lung: Secondary | ICD-10-CM

## 2014-01-13 ENCOUNTER — Other Ambulatory Visit (HOSPITAL_COMMUNITY): Payer: Self-pay | Admitting: Rheumatology

## 2014-01-13 DIAGNOSIS — M25519 Pain in unspecified shoulder: Secondary | ICD-10-CM

## 2015-01-26 ENCOUNTER — Other Ambulatory Visit: Payer: Self-pay

## 2015-05-06 ENCOUNTER — Other Ambulatory Visit (HOSPITAL_COMMUNITY): Payer: Self-pay | Admitting: Rheumatology

## 2015-05-06 DIAGNOSIS — M25562 Pain in left knee: Secondary | ICD-10-CM

## 2015-09-23 DIAGNOSIS — M25511 Pain in right shoulder: Secondary | ICD-10-CM | POA: Diagnosis not present

## 2015-09-23 DIAGNOSIS — L408 Other psoriasis: Secondary | ICD-10-CM | POA: Diagnosis not present

## 2015-09-23 DIAGNOSIS — M170 Bilateral primary osteoarthritis of knee: Secondary | ICD-10-CM | POA: Diagnosis not present

## 2015-09-23 DIAGNOSIS — Z09 Encounter for follow-up examination after completed treatment for conditions other than malignant neoplasm: Secondary | ICD-10-CM | POA: Diagnosis not present

## 2015-09-29 DIAGNOSIS — M25561 Pain in right knee: Secondary | ICD-10-CM | POA: Diagnosis not present

## 2015-10-01 ENCOUNTER — Other Ambulatory Visit (HOSPITAL_COMMUNITY): Payer: Self-pay | Admitting: Orthopaedic Surgery

## 2015-10-01 ENCOUNTER — Ambulatory Visit (INDEPENDENT_AMBULATORY_CARE_PROVIDER_SITE_OTHER)
Admission: RE | Admit: 2015-10-01 | Discharge: 2015-10-01 | Disposition: A | Discharge status: Home / Self Care | Payer: Medicare Other | Source: Ambulatory Visit | Attending: Vascular Surgery | Admitting: Vascular Surgery

## 2015-10-01 ENCOUNTER — Ambulatory Visit (HOSPITAL_COMMUNITY)
Admission: RE | Admit: 2015-10-01 | Discharge: 2015-10-01 | Disposition: A | Discharge status: Home / Self Care | Payer: Medicare Other | Source: Ambulatory Visit | Attending: Vascular Surgery | Admitting: Vascular Surgery

## 2015-10-01 DIAGNOSIS — I10 Essential (primary) hypertension: Secondary | ICD-10-CM | POA: Diagnosis not present

## 2015-10-01 DIAGNOSIS — M79606 Pain in leg, unspecified: Secondary | ICD-10-CM

## 2015-10-01 DIAGNOSIS — R490 Dysphonia: Secondary | ICD-10-CM | POA: Diagnosis not present

## 2015-10-01 DIAGNOSIS — J383 Other diseases of vocal cords: Secondary | ICD-10-CM | POA: Diagnosis not present

## 2015-10-01 DIAGNOSIS — J3802 Paralysis of vocal cords and larynx, bilateral: Secondary | ICD-10-CM | POA: Diagnosis not present

## 2015-10-05 DIAGNOSIS — J383 Other diseases of vocal cords: Secondary | ICD-10-CM | POA: Diagnosis not present

## 2015-10-05 DIAGNOSIS — R490 Dysphonia: Secondary | ICD-10-CM | POA: Diagnosis not present

## 2015-10-15 DIAGNOSIS — M25569 Pain in unspecified knee: Secondary | ICD-10-CM | POA: Diagnosis not present

## 2015-10-15 DIAGNOSIS — I10 Essential (primary) hypertension: Secondary | ICD-10-CM | POA: Diagnosis not present

## 2015-10-15 DIAGNOSIS — Z01818 Encounter for other preprocedural examination: Secondary | ICD-10-CM | POA: Diagnosis not present

## 2015-10-15 DIAGNOSIS — G8929 Other chronic pain: Secondary | ICD-10-CM | POA: Diagnosis not present

## 2015-10-16 DIAGNOSIS — J383 Other diseases of vocal cords: Secondary | ICD-10-CM | POA: Diagnosis not present

## 2015-10-16 DIAGNOSIS — R490 Dysphonia: Secondary | ICD-10-CM | POA: Diagnosis not present

## 2015-10-27 NOTE — Pre-Procedure Instructions (Signed)
    Alan Roman  10/27/2015      [id number] - [place], [place] - [address] [place] [id number] Phone: [phone] Fax: [phone]    Your procedure is scheduled on Tuesday, April 4.  [facility]               Your surgery or procedure is scheduled for 10:05 AM   Call this number if you have problems the morning of surgery:[phone]                    For any other questions, please call [phone], Monday - Friday 8 AM - 4 PM.   Remember:  Do not eat food or drink liquids after midnight Monday,, April 4.  Take these medicines the morning of surgery with A SIP OF WATER : Anlodipine                 Do not wear jewelry, make-up or nail polish.  Do not wear lotions, powders, or perfumes.   Men may shave face and neck.  Do not bring valuables to the hospital.  [facility] is not responsible for any belongings or valuables.  Contacts, dentures or bridgework may not be worn into surgery.  Leave your suitcase in the car.  After surgery it may be brought to your room.  For patients admitted to the hospital, discharge time will be determined by your treatment team.  Special instructions:  -  Please read over the following fact sheets that you were given. Pain Booklet, Coughing and Deep Breathing, Blood Transfusion Information, MRSA Information and Surgical Site Infection Prevention, Incentive Spirometery

## 2015-10-28 ENCOUNTER — Encounter (HOSPITAL_COMMUNITY)
Admission: RE | Admit: 2015-10-28 | Discharge: 2015-10-28 | Disposition: A | Discharge status: Home / Self Care | Payer: Medicare Other | Source: Ambulatory Visit | Attending: Orthopaedic Surgery | Admitting: Orthopaedic Surgery

## 2015-10-28 ENCOUNTER — Encounter (HOSPITAL_COMMUNITY): Payer: Self-pay

## 2015-10-28 ENCOUNTER — Encounter (HOSPITAL_COMMUNITY)
Admission: RE | Admit: 2015-10-28 | Discharge: 2015-10-28 | Disposition: A | Discharge status: Home / Self Care | Payer: Medicare Other | Source: Ambulatory Visit | Attending: Orthopedic Surgery | Admitting: Orthopedic Surgery

## 2015-10-28 DIAGNOSIS — M1712 Unilateral primary osteoarthritis, left knee: Secondary | ICD-10-CM | POA: Insufficient documentation

## 2015-10-28 DIAGNOSIS — Z79899 Other long term (current) drug therapy: Secondary | ICD-10-CM | POA: Diagnosis not present

## 2015-10-28 DIAGNOSIS — I493 Ventricular premature depolarization: Secondary | ICD-10-CM | POA: Insufficient documentation

## 2015-10-28 DIAGNOSIS — Z01818 Encounter for other preprocedural examination: Secondary | ICD-10-CM

## 2015-10-28 DIAGNOSIS — Z0183 Encounter for blood typing: Secondary | ICD-10-CM | POA: Diagnosis not present

## 2015-10-28 DIAGNOSIS — I517 Cardiomegaly: Secondary | ICD-10-CM | POA: Diagnosis not present

## 2015-10-28 DIAGNOSIS — Z01812 Encounter for preprocedural laboratory examination: Secondary | ICD-10-CM | POA: Diagnosis not present

## 2015-10-28 LAB — COMPREHENSIVE METABOLIC PANEL
ALT: 22 U/L (ref 17–63)
AST: 29 U/L (ref 15–41)
Albumin: 3.9 g/dL (ref 3.5–5.0)
Alkaline Phosphatase: 71 U/L (ref 38–126)
Anion gap: 8 (ref 5–15)
BUN: 13 mg/dL (ref 6–20)
CO2: 26 mmol/L (ref 22–32)
Calcium: 9.2 mg/dL (ref 8.9–10.3)
Chloride: 107 mmol/L (ref 101–111)
Creatinine, Ser: 1.06 mg/dL (ref 0.61–1.24)
GFR calc Af Amer: >60 mL/min (ref >60)
GFR calc non Af Amer: >60 mL/min (ref >60)
Glucose, Bld: 96 mg/dL (ref 65–99)
Potassium: 4.6 mmol/L (ref 3.5–5.1)
Sodium: 141 mmol/L (ref 135–145)
Total Bilirubin: 0.8 mg/dL (ref 0.3–1.2)
Total Protein: 7.1 g/dL (ref 6.5–8.1)

## 2015-10-28 LAB — URINE MICROSCOPIC-ADD ON: Bacteria, UA: NONE SEEN

## 2015-10-28 LAB — CBC WITH DIFFERENTIAL/PLATELET
Basophils Absolute: 0 10*3/uL (ref 0.0–0.1)
Basophils Relative: 0 %
Eosinophils Absolute: 0.1 10*3/uL (ref 0.0–0.7)
Eosinophils Relative: 2 %
HCT: 44.4 % (ref 39.0–52.0)
Hemoglobin: 15.4 g/dL (ref 13.0–17.0)
Lymphocytes Relative: 23 %
Lymphs Abs: 1.5 10*3/uL (ref 0.7–4.0)
MCH: 32.6 pg (ref 26.0–34.0)
MCHC: 34.7 g/dL (ref 30.0–36.0)
MCV: 93.9 fL (ref 78.0–100.0)
Monocytes Absolute: 0.6 10*3/uL (ref 0.1–1.0)
Monocytes Relative: 10 %
Neutro Abs: 4.2 10*3/uL (ref 1.7–7.7)
Neutrophils Relative %: 65 %
Platelets: 168 10*3/uL (ref 150–400)
RBC: 4.73 MIL/uL (ref 4.22–5.81)
RDW: 12.3 % (ref 11.5–15.5)
WBC: 6.4 10*3/uL (ref 4.0–10.5)

## 2015-10-28 LAB — TYPE AND SCREEN
ABO/RH(D): B POS
Antibody Screen: NEGATIVE

## 2015-10-28 LAB — URINALYSIS, ROUTINE W REFLEX MICROSCOPIC
Glucose, UA: NEGATIVE mg/dL
Hgb urine dipstick: NEGATIVE
Ketones, ur: 15 mg/dL — AB
Nitrite: NEGATIVE
Protein, ur: NEGATIVE mg/dL
Specific Gravity, Urine: 1.023 (ref 1.005–1.030)
pH: 6 (ref 5.0–8.0)

## 2015-10-28 LAB — SURGICAL PCR SCREEN
MRSA, PCR: NEGATIVE
Staphylococcus aureus: NEGATIVE

## 2015-10-28 LAB — ABO/RH: ABO/RH(D): B POS

## 2015-10-28 NOTE — Pre-Procedure Instructions (Addendum)
Alan Roman  10/28/2015      [facility] - [place], [place] - [address] [place] [id number] Phone: [phone] Fax: [phone]    Your procedure is scheduled on Tuesday, April 4.  [facility] at 8:00 A.M.               Your surgery or procedure is scheduled for 10:05 AM   Call this number if you have problems the morning of surgery:[phone]                    For any other questions, please call [phone], Monday - Friday 8 AM - 4 PM.   Remember:  Do not eat food or drink liquids after midnight Monday,, April 4.  Take these medicines the morning of surgery with A SIP OF WATER : Amlodipine  STOP all herbel meds, nsaids (aleve,naproxen,advil,ibuprofen) 5 days prior to surgery starting today including vitamins, aspirin                 Do not wear jewelry, make-up or nail polish.  Do not wear lotions, powders, or perfumes.   Men may shave face and neck.  Do not bring valuables to the hospital.  [facility] is not responsible for any belongings or valuables.  Contacts, dentures or bridgework may not be worn into surgery.  Leave your suitcase in the car.  After surgery it may be brought to your room.  For patients admitted to the hospital, discharge time will be determined by your treatment team.  Special instructions:  - Special Instructions: [place] - Preparing for Surgery  Before surgery, you can play an important role.  Because skin is not sterile, your skin needs to be as free of germs as possible.  You can reduce the number of germs on you skin by washing with CHG (chlorahexidine gluconate) soap before surgery.  CHG is an antiseptic cleaner which kills germs and bonds with the skin to continue killing germs even after washing.  Please DO NOT use if you have an allergy to CHG or antibacterial soaps.  If your skin becomes reddened/irritated stop using the CHG and  inform your nurse when you arrive at Short Stay.  Do not shave (including legs and underarms) for at least 48 hours prior to the first CHG shower.  You may shave your face.  Please follow these instructions carefully:   1.  Shower with CHG Soap the night before surgery and the morning of Surgery.  2.  If you choose to wash your hair, wash your hair first as usual with your normal shampoo.  3.  After you shampoo, rinse your hair and body thoroughly to remove the Shampoo.  4.  Use CHG as you would any other liquid soap.  You can apply chg directly  to the skin and wash gently with scrungie or a clean washcloth.  5.  Apply the CHG Soap to your body ONLY FROM THE NECK DOWN.  Do not use on open wounds or open sores.  Avoid contact with your eyes ears, mouth and genitals (private parts).  Wash genitals (private parts)       with your normal soap.  6.  Wash thoroughly, paying special attention to the area where your surgery will be performed.  7.  Thoroughly rinse your body with warm water from the neck down.  8.  DO NOT shower/wash with your  normal soap after using and rinsing off the CHG Soap.  9.  Pat yourself dry with a clean towel.            10.  Wear clean pajamas.            11.  Place clean sheets on your bed the night of your first shower and do not sleep with pets.  Day of Surgery  Do not apply any lotions/deodorants the morning of surgery.  Please wear clean clothes to the hospital/surgery center.  Please read over the following fact sheets that you were given. Pain Booklet, Coughing and Deep Breathing, Blood Transfusion Information, MRSA Information and Surgical Site Infection Prevention, Incentive Spirometery

## 2015-10-29 LAB — URINE CULTURE: Culture: 7000

## 2015-11-01 NOTE — H&P (Signed)
CHIEF COMPLAINT:  Painful left knee.   HISTORY OF PRESENT ILLNESS:  [name] is a very pleasant 70 year old male who is seen today for evaluation of his left knee.  He has had bilateral knee pain and has been treated by Dr. Corliss Skainseveshwar and Melina CopaHans Panwala, PA-C.  He does have a history of psoriasis and psoriatic arthritis in the past noted.  He has been noted to have end-stage OA in both knees.  He has been treated with cortisone as well as Euflexxa bilaterally on multiple occasions.  He recently had returned from a trip to [place] and had an MRI scan performed of both knees while visiting.  The report noted a radial tear of the posterior root of the medial meniscus and degenerative arthritis involving the medial compartment with near-full-thickness cartilage loss.  Also an MRI of the left knee was performed on 09/12/2015 demonstrating degenerative osteophytic changes involving the medial compartment of the knee joint with near-full-thickness cartilage loss and a moderate joint effusion with mild synovial thickening.  He has been treated with Henderson [place]tezla for his psoriatic arthritis with a very good response.  He is basically very healthy and just has hypertension.  He continues though to have rather significant pain and discomfort and difficulty with ambulation.  Because of this, he is seen today for evaluation.   PAST MEDICAL HISTORY:  In general, his health is good.   PAST SURGICAL HISTORY:  He has had bilateral knee scopes at separate intervals.  Abdominal hernia repair x2.   MEDICATIONS:  Norvasc 5 mg daily, losartan 5 mg daily, Otezla 30 mg b.i.d.     ALLERGIES:  None known.   FOURTEEN-POINT REVIEW OF SYSTEMS:  Totally unremarkable except for a history of hypertension for 10 to 15 years.   FAMILY HISTORY:  Positive for mother who died at 71 years of age from a stroke.  She had hypertension and had a heart attack in the past.  Father died in his 8040's from lung cancer probably secondary to cigarette  smoking.  He has 1 brother at age 71, alive and healthy.  Three sisters ages 4360, 1264, and 4569, alive and healthy also.   SOCIAL HISTORY:  [name] is a 70 year old male who is married.  He denies the use of tobacco or alcohol.   PHYSICAL EXAMINATION:   General:  Today reveals a very pleasant 70 year old male well developed, well nourished, alert, pleasant, cooperative in moderate distress secondary to left knee pain.  He is 5 feet 7 inches and weighs 155 pounds with a BMI of 24.3.   Vital Signs:  Reveals a pulse of 72, respirations 12, temperature 97 degrees Fahrenheit, and blood pressure 110/74. Head:  Normocephalic.  Eyes:  Pupils equal, round, and reactive to light and accommodation with extraocular movements intact. Ears/Nose/Throat:  Benign. Chest:  Had good expansion. Lungs:  Essentially clear. Cardiac:  Had a regular rhythm and rate with an occasional ectopic.  No murmurs, rubs, or gallops appreciated. Pulses:  Were 1+, bilateral and symmetric, in the lower extremities. Abdomen:  Scaphoid, soft, nontender.  No mass palpable.  Normal bowel sounds present. CNS:  He is oriented x3, and cranial nerves II through XII grossly intact. Breast/Rectal/Genital:  Not indicated for an orthopedic evaluation. Musculoskeletal:  Today he has range of motion from 0 degrees to about 125 degrees.  He does have tenderness to palpation of the medial more than lateral joint space.  Does have a trace to 1+ effusion without warmth or erythema.  Does  have a little pseudolaxity with varus and valgus stressing but with good endpoints.   RADIOGRAPHS:  Radiographic studies reveal end-stage OA of the left knee with medial, lateral, and patellofemoral compartments.  He has periarticular spurring.  Appears to have a loose body posteriorly.  Does have some spurring at the insertion of the patellar tendon on the left.   CLINICAL IMPRESSION:   1.  End-stage OA of the left knee. 2.  History of hypertension. 3.  History of  psoriatic arthritis on Otezla.   RECOMMENDATIONS:  At this time I have reviewed a form from Dr. Sherril Croon who feels that he, from the standpoint of medical and cardiac, is a candidate for total knee replacement.  He wants him to continue with his meds with asip of water the day of surgery.  No aspirin or prednisone 2 weeks before his surgery, and he will stop his [place] before also.  Therefore, it is our plan to proceed with total knee replacement on the left.  Procedure, risks, and benefits were fully explained to him including using models as well as a detailed explanation.  All questions were answered.    Oris Drone Aleda [facility] [phone]  11/01/2015 1:38 PM

## 2015-11-02 MED ORDER — SODIUM CHLORIDE 0.9 % IV SOLN: INTRAVENOUS | Status: DC

## 2015-11-02 MED ORDER — CEFAZOLIN SODIUM-DEXTROSE 2-4 GM/100ML-% IV SOLN
2.0000 g | INTRAVENOUS | Status: AC
  Administered 2015-11-03: 2 g via INTRAVENOUS
  Filled 2015-11-02: qty 100

## 2015-11-02 MED ORDER — TRANEXAMIC ACID 1000 MG/10ML IV SOLN
2000.0000 mg | INTRAVENOUS | Status: AC
  Administered 2015-11-03: 2000 mg via TOPICAL
  Filled 2015-11-02: qty 20

## 2015-11-02 MED ORDER — ACETAMINOPHEN 10 MG/ML IV SOLN
1000.0000 mg | INTRAVENOUS | Status: AC
  Administered 2015-11-03: 1000 mg via INTRAVENOUS

## 2015-11-03 ENCOUNTER — Encounter (HOSPITAL_COMMUNITY)
Admission: RE | Disposition: A | Discharge status: Home Health | Payer: Self-pay | Source: Ambulatory Visit | Attending: Orthopaedic Surgery

## 2015-11-03 ENCOUNTER — Inpatient Hospital Stay (HOSPITAL_COMMUNITY): Payer: Medicare Other | Admitting: Certified Registered Nurse Anesthetist

## 2015-11-03 ENCOUNTER — Inpatient Hospital Stay (HOSPITAL_COMMUNITY)
Admission: RE | Admit: 2015-11-03 | Discharge: 2015-11-07 | DRG: 470 | Disposition: A | Discharge status: Home Health | Payer: Medicare Other | Source: Ambulatory Visit | Attending: Orthopaedic Surgery | Admitting: Orthopaedic Surgery

## 2015-11-03 ENCOUNTER — Encounter (HOSPITAL_COMMUNITY): Payer: Self-pay | Admitting: Certified Registered Nurse Anesthetist

## 2015-11-03 DIAGNOSIS — Z961 Presence of intraocular lens: Secondary | ICD-10-CM | POA: Diagnosis present

## 2015-11-03 DIAGNOSIS — Z823 Family history of stroke: Secondary | ICD-10-CM | POA: Diagnosis not present

## 2015-11-03 DIAGNOSIS — I10 Essential (primary) hypertension: Secondary | ICD-10-CM | POA: Diagnosis not present

## 2015-11-03 DIAGNOSIS — M170 Bilateral primary osteoarthritis of knee: Principal | ICD-10-CM | POA: Diagnosis present

## 2015-11-03 DIAGNOSIS — A1889 Tuberculosis of other sites: Secondary | ICD-10-CM | POA: Diagnosis present

## 2015-11-03 DIAGNOSIS — K5900 Constipation, unspecified: Secondary | ICD-10-CM | POA: Diagnosis not present

## 2015-11-03 DIAGNOSIS — M659 Synovitis and tenosynovitis, unspecified: Secondary | ICD-10-CM | POA: Diagnosis present

## 2015-11-03 DIAGNOSIS — M25569 Pain in unspecified knee: Secondary | ICD-10-CM | POA: Diagnosis not present

## 2015-11-03 DIAGNOSIS — Z9842 Cataract extraction status, left eye: Secondary | ICD-10-CM

## 2015-11-03 DIAGNOSIS — M179 Osteoarthritis of knee, unspecified: Secondary | ICD-10-CM | POA: Diagnosis not present

## 2015-11-03 DIAGNOSIS — Z8611 Personal history of tuberculosis: Secondary | ICD-10-CM | POA: Diagnosis present

## 2015-11-03 DIAGNOSIS — D696 Thrombocytopenia, unspecified: Secondary | ICD-10-CM | POA: Diagnosis not present

## 2015-11-03 DIAGNOSIS — G8918 Other acute postprocedural pain: Secondary | ICD-10-CM | POA: Diagnosis not present

## 2015-11-03 DIAGNOSIS — I951 Orthostatic hypotension: Secondary | ICD-10-CM | POA: Diagnosis not present

## 2015-11-03 DIAGNOSIS — M1712 Unilateral primary osteoarthritis, left knee: Secondary | ICD-10-CM | POA: Diagnosis present

## 2015-11-03 DIAGNOSIS — J9811 Atelectasis: Secondary | ICD-10-CM | POA: Diagnosis not present

## 2015-11-03 DIAGNOSIS — R059 Cough, unspecified: Secondary | ICD-10-CM

## 2015-11-03 DIAGNOSIS — D649 Anemia, unspecified: Secondary | ICD-10-CM | POA: Diagnosis present

## 2015-11-03 DIAGNOSIS — D62 Acute posthemorrhagic anemia: Secondary | ICD-10-CM | POA: Diagnosis not present

## 2015-11-03 DIAGNOSIS — L4050 Arthropathic psoriasis, unspecified: Secondary | ICD-10-CM | POA: Diagnosis present

## 2015-11-03 DIAGNOSIS — Z96652 Presence of left artificial knee joint: Secondary | ICD-10-CM

## 2015-11-03 DIAGNOSIS — M2540 Effusion, unspecified joint: Secondary | ICD-10-CM | POA: Diagnosis present

## 2015-11-03 DIAGNOSIS — E876 Hypokalemia: Secondary | ICD-10-CM | POA: Diagnosis not present

## 2015-11-03 DIAGNOSIS — Z9841 Cataract extraction status, right eye: Secondary | ICD-10-CM | POA: Diagnosis not present

## 2015-11-03 DIAGNOSIS — Z79899 Other long term (current) drug therapy: Secondary | ICD-10-CM | POA: Diagnosis not present

## 2015-11-03 DIAGNOSIS — R05 Cough: Secondary | ICD-10-CM

## 2015-11-03 HISTORY — DX: Personal history of other diseases of the digestive system: Z87.19

## 2015-11-03 HISTORY — PX: TOTAL KNEE ARTHROPLASTY: SHX125

## 2015-11-03 SURGERY — ARTHROPLASTY, KNEE, TOTAL
Anesthesia: Regional | Laterality: Left

## 2015-11-03 MED ORDER — BISACODYL 10 MG RE SUPP: 10.0000 mg | Freq: Every day | RECTAL | Status: DC | PRN

## 2015-11-03 MED ORDER — RIVAROXABAN 10 MG PO TABS
10.0000 mg | ORAL_TABLET | Freq: Every day | ORAL | Status: DC
  Administered 2015-11-04 – 2015-11-07 (×4): 10 mg via ORAL
  Filled 2015-11-03 (×5): qty 1

## 2015-11-03 MED ORDER — FENTANYL CITRATE (PF) 100 MCG/2ML IJ SOLN
INTRAMUSCULAR | Status: AC
  Administered 2015-11-03: 50 ug
  Filled 2015-11-03: qty 2

## 2015-11-03 MED ORDER — ONDANSETRON HCL 4 MG PO TABS: 4.0000 mg | ORAL_TABLET | Freq: Four times a day (QID) | ORAL | Status: DC | PRN

## 2015-11-03 MED ORDER — SODIUM CHLORIDE 0.9 % IV SOLN
INTRAVENOUS | Status: DC
  Administered 2015-11-03: 20:00:00 via INTRAVENOUS

## 2015-11-03 MED ORDER — BUPIVACAINE-EPINEPHRINE (PF) 0.25% -1:200000 IJ SOLN
INTRAMUSCULAR | Status: AC
  Filled 2015-11-03: qty 30

## 2015-11-03 MED ORDER — HYDROMORPHONE HCL 1 MG/ML IJ SOLN
0.2500 mg | INTRAMUSCULAR | Status: DC | PRN
  Administered 2015-11-03 (×4): 0.5 mg via INTRAVENOUS

## 2015-11-03 MED ORDER — ACETAMINOPHEN 10 MG/ML IV SOLN
1000.0000 mg | Freq: Four times a day (QID) | INTRAVENOUS | Status: AC
  Administered 2015-11-03 – 2015-11-04 (×4): 1000 mg via INTRAVENOUS
  Filled 2015-11-03 (×4): qty 100

## 2015-11-03 MED ORDER — PHENYLEPHRINE HCL 10 MG/ML IJ SOLN
10.0000 mg | INTRAVENOUS | Status: DC | PRN
  Administered 2015-11-03: 30 ug/min via INTRAVENOUS

## 2015-11-03 MED ORDER — ACETAMINOPHEN 10 MG/ML IV SOLN
INTRAVENOUS | Status: AC
  Filled 2015-11-03: qty 100

## 2015-11-03 MED ORDER — LIDOCAINE HCL (CARDIAC) 20 MG/ML IV SOLN
INTRAVENOUS | Status: DC | PRN
  Administered 2015-11-03: 50 mg via INTRAVENOUS

## 2015-11-03 MED ORDER — SODIUM CHLORIDE 0.9 % IR SOLN
Status: DC | PRN
  Administered 2015-11-03: 1000 mL

## 2015-11-03 MED ORDER — CHLORHEXIDINE GLUCONATE 4 % EX LIQD: 60.0000 mL | Freq: Once | CUTANEOUS | Status: DC

## 2015-11-03 MED ORDER — FENTANYL CITRATE (PF) 250 MCG/5ML IJ SOLN
INTRAMUSCULAR | Status: AC
  Filled 2015-11-03: qty 5

## 2015-11-03 MED ORDER — PHENYLEPHRINE HCL 10 MG/ML IJ SOLN
INTRAMUSCULAR | Status: DC | PRN
  Administered 2015-11-03: 40 ug via INTRAVENOUS
  Administered 2015-11-03: 120 ug via INTRAVENOUS
  Administered 2015-11-03: 80 ug via INTRAVENOUS
  Administered 2015-11-03 (×2): 120 ug via INTRAVENOUS
  Administered 2015-11-03: 80 ug via INTRAVENOUS

## 2015-11-03 MED ORDER — OXYCODONE HCL 5 MG PO TABS
5.0000 mg | ORAL_TABLET | ORAL | Status: DC | PRN
  Administered 2015-11-03 – 2015-11-05 (×4): 10 mg via ORAL
  Administered 2015-11-05: 5 mg via ORAL
  Administered 2015-11-06 – 2015-11-07 (×2): 10 mg via ORAL
  Filled 2015-11-03 (×3): qty 2
  Filled 2015-11-03: qty 1
  Filled 2015-11-03 (×2): qty 2

## 2015-11-03 MED ORDER — MEPERIDINE HCL 25 MG/ML IJ SOLN: 6.2500 mg | INTRAMUSCULAR | Status: DC | PRN

## 2015-11-03 MED ORDER — SUGAMMADEX SODIUM 200 MG/2ML IV SOLN
INTRAVENOUS | Status: AC
  Filled 2015-11-03: qty 2

## 2015-11-03 MED ORDER — OXYCODONE HCL 5 MG PO TABS
ORAL_TABLET | ORAL | Status: AC
  Filled 2015-11-03: qty 2

## 2015-11-03 MED ORDER — METOCLOPRAMIDE HCL 5 MG PO TABS: 5.0000 mg | ORAL_TABLET | Freq: Three times a day (TID) | ORAL | Status: DC | PRN

## 2015-11-03 MED ORDER — PROPOFOL 10 MG/ML IV BOLUS
INTRAVENOUS | Status: DC | PRN
  Administered 2015-11-03: 150 mg via INTRAVENOUS

## 2015-11-03 MED ORDER — LOSARTAN POTASSIUM 25 MG PO TABS
25.0000 mg | ORAL_TABLET | Freq: Every day | ORAL | Status: DC
  Administered 2015-11-04: 25 mg via ORAL
  Filled 2015-11-03 (×4): qty 1

## 2015-11-03 MED ORDER — SUGAMMADEX SODIUM 200 MG/2ML IV SOLN
INTRAVENOUS | Status: DC | PRN
  Administered 2015-11-03: 200 mg via INTRAVENOUS

## 2015-11-03 MED ORDER — ROCURONIUM BROMIDE 100 MG/10ML IV SOLN
INTRAVENOUS | Status: DC | PRN
  Administered 2015-11-03: 40 mg via INTRAVENOUS

## 2015-11-03 MED ORDER — DIPHENHYDRAMINE HCL 12.5 MG/5ML PO ELIX: 12.5000 mg | ORAL_SOLUTION | ORAL | Status: DC | PRN

## 2015-11-03 MED ORDER — HYDROMORPHONE HCL 1 MG/ML IJ SOLN
INTRAMUSCULAR | Status: AC
  Administered 2015-11-03: 0.5 mg via INTRAVENOUS
  Filled 2015-11-03: qty 1

## 2015-11-03 MED ORDER — LACTATED RINGERS IV SOLN
INTRAVENOUS | Status: DC | PRN
  Administered 2015-11-03 (×2): via INTRAVENOUS

## 2015-11-03 MED ORDER — METHOCARBAMOL 1000 MG/10ML IJ SOLN
500.0000 mg | Freq: Four times a day (QID) | INTRAVENOUS | Status: DC | PRN
  Filled 2015-11-03: qty 5

## 2015-11-03 MED ORDER — METHOCARBAMOL 500 MG PO TABS
500.0000 mg | ORAL_TABLET | Freq: Four times a day (QID) | ORAL | Status: DC | PRN
Start: 2015-11-03 — End: 2015-11-07
  Administered 2015-11-03 – 2015-11-05 (×2): 500 mg via ORAL
  Filled 2015-11-03 (×2): qty 1

## 2015-11-03 MED ORDER — MENTHOL 3 MG MT LOZG: 1.0000 | LOZENGE | OROMUCOSAL | Status: DC | PRN

## 2015-11-03 MED ORDER — BUPIVACAINE-EPINEPHRINE 0.25% -1:200000 IJ SOLN
INTRAMUSCULAR | Status: DC | PRN
  Administered 2015-11-03: 30 mL

## 2015-11-03 MED ORDER — MIDAZOLAM HCL 5 MG/ML IJ SOLN
1.0000 mg | Freq: Once | INTRAMUSCULAR | Status: AC
  Administered 2015-11-03: 1 mg via INTRAVENOUS

## 2015-11-03 MED ORDER — ONDANSETRON HCL 4 MG/2ML IJ SOLN: 4.0000 mg | Freq: Four times a day (QID) | INTRAMUSCULAR | Status: DC | PRN

## 2015-11-03 MED ORDER — ALUM & MAG HYDROXIDE-SIMETH 200-200-20 MG/5ML PO SUSP: 30.0000 mL | ORAL | Status: DC | PRN

## 2015-11-03 MED ORDER — KETOROLAC TROMETHAMINE 15 MG/ML IJ SOLN
7.5000 mg | Freq: Four times a day (QID) | INTRAMUSCULAR | Status: AC
  Administered 2015-11-03 – 2015-11-04 (×4): 7.5 mg via INTRAVENOUS
  Filled 2015-11-03 (×3): qty 1

## 2015-11-03 MED ORDER — METOCLOPRAMIDE HCL 5 MG/ML IJ SOLN: 5.0000 mg | Freq: Three times a day (TID) | INTRAMUSCULAR | Status: DC | PRN

## 2015-11-03 MED ORDER — PHENOL 1.4 % MT LIQD: 1.0000 | OROMUCOSAL | Status: DC | PRN

## 2015-11-03 MED ORDER — LACTATED RINGERS IV SOLN: INTRAVENOUS | Status: DC

## 2015-11-03 MED ORDER — CEFAZOLIN SODIUM-DEXTROSE 2-4 GM/100ML-% IV SOLN
2.0000 g | Freq: Four times a day (QID) | INTRAVENOUS | Status: AC
  Administered 2015-11-03 – 2015-11-04 (×2): 2 g via INTRAVENOUS
  Filled 2015-11-03 (×2): qty 100

## 2015-11-03 MED ORDER — KETOROLAC TROMETHAMINE 15 MG/ML IJ SOLN
INTRAMUSCULAR | Status: AC
  Filled 2015-11-03: qty 1

## 2015-11-03 MED ORDER — MAGNESIUM CITRATE PO SOLN: 1.0000 | Freq: Once | ORAL | Status: DC | PRN

## 2015-11-03 MED ORDER — METHOCARBAMOL 500 MG PO TABS
ORAL_TABLET | ORAL | Status: AC
  Filled 2015-11-03: qty 1

## 2015-11-03 MED ORDER — DOCUSATE SODIUM 100 MG PO CAPS
100.0000 mg | ORAL_CAPSULE | Freq: Two times a day (BID) | ORAL | Status: DC
  Administered 2015-11-03 – 2015-11-07 (×5): 100 mg via ORAL
  Filled 2015-11-03 (×7): qty 1

## 2015-11-03 MED ORDER — POLYETHYLENE GLYCOL 3350 17 G PO PACK
17.0000 g | PACK | Freq: Every day | ORAL | Status: DC | PRN
  Filled 2015-11-03: qty 1

## 2015-11-03 MED ORDER — AMLODIPINE BESYLATE 5 MG PO TABS
5.0000 mg | ORAL_TABLET | Freq: Every day | ORAL | Status: DC
Start: 2015-11-03 — End: 2015-11-05
  Administered 2015-11-04: 5 mg via ORAL
  Filled 2015-11-03: qty 1

## 2015-11-03 MED ORDER — FENTANYL CITRATE (PF) 100 MCG/2ML IJ SOLN
INTRAMUSCULAR | Status: AC
  Filled 2015-11-03: qty 2

## 2015-11-03 MED ORDER — MIDAZOLAM HCL 2 MG/2ML IJ SOLN
INTRAMUSCULAR | Status: AC
  Filled 2015-11-03: qty 2

## 2015-11-03 MED ORDER — FENTANYL CITRATE (PF) 100 MCG/2ML IJ SOLN
INTRAMUSCULAR | Status: DC | PRN
  Administered 2015-11-03 (×5): 50 ug via INTRAVENOUS

## 2015-11-03 MED ORDER — BUPIVACAINE-EPINEPHRINE (PF) 0.5% -1:200000 IJ SOLN
INTRAMUSCULAR | Status: DC | PRN
  Administered 2015-11-03: 30 mL via PERINEURAL

## 2015-11-03 MED ORDER — ONDANSETRON HCL 4 MG/2ML IJ SOLN
INTRAMUSCULAR | Status: DC | PRN
  Administered 2015-11-03: 4 mg via INTRAVENOUS

## 2015-11-03 SURGICAL SUPPLY — 59 items
BANDAGE ESMARK 6X9 LF (GAUZE/BANDAGES/DRESSINGS) ×1 IMPLANT
BLADE SAGITTAL 25.0X1.19X90 (BLADE) ×2 IMPLANT
BNDG CMPR 9X6 STRL LF SNTH (GAUZE/BANDAGES/DRESSINGS) ×1
BNDG ESMARK 6X9 LF (GAUZE/BANDAGES/DRESSINGS) ×2
BOWL SMART MIX CTS (DISPOSABLE) ×2 IMPLANT
CAP KNEE TOTAL 3 SIGMA ×1 IMPLANT
CEMENT HV SMART SET (Cement) ×4 IMPLANT
COVER SURGICAL LIGHT HANDLE (MISCELLANEOUS) ×2 IMPLANT
CUFF TOURNIQUET SINGLE 34IN LL (TOURNIQUET CUFF) ×1 IMPLANT
DECANTER SPIKE VIAL GLASS SM (MISCELLANEOUS) ×1 IMPLANT
DRAPE EXTREMITY T 121X128X90 (DRAPE) ×2 IMPLANT
DRAPE PROXIMA HALF (DRAPES) ×2 IMPLANT
DRSG ADAPTIC 3X8 NADH LF (GAUZE/BANDAGES/DRESSINGS) ×2 IMPLANT
DRSG PAD ABDOMINAL 8X10 ST (GAUZE/BANDAGES/DRESSINGS) ×2 IMPLANT
DURAPREP 26ML APPLICATOR (WOUND CARE) ×4 IMPLANT
ELECT CAUTERY BLADE 6.4 (BLADE) ×2 IMPLANT
ELECT REM PT RETURN 9FT ADLT (ELECTROSURGICAL) ×2
ELECTRODE REM PT RTRN 9FT ADLT (ELECTROSURGICAL) ×1 IMPLANT
FACESHIELD WRAPAROUND (MASK) ×10 IMPLANT
FACESHIELD WRAPAROUND OR TEAM (MASK) ×2 IMPLANT
GAUZE SPONGE 4X4 12PLY STRL (GAUZE/BANDAGES/DRESSINGS) ×1 IMPLANT
GLOVE BIOGEL PI IND STRL 8 (GLOVE) ×1 IMPLANT
GLOVE BIOGEL PI IND STRL 8.5 (GLOVE) ×1 IMPLANT
GLOVE BIOGEL PI INDICATOR 8 (GLOVE) ×1
GLOVE BIOGEL PI INDICATOR 8.5 (GLOVE) ×1
GLOVE ECLIPSE 8.0 STRL XLNG CF (GLOVE) ×4 IMPLANT
GLOVE SURG ORTHO 8.5 STRL (GLOVE) ×4 IMPLANT
GOWN STRL REUS W/ TWL LRG LVL3 (GOWN DISPOSABLE) ×2 IMPLANT
GOWN STRL REUS W/TWL 2XL LVL3 (GOWN DISPOSABLE) ×2 IMPLANT
GOWN STRL REUS W/TWL LRG LVL3 (GOWN DISPOSABLE) ×4
HANDPIECE INTERPULSE COAX TIP (DISPOSABLE) ×2
HEMOVAC CWS 400 CLOSED WOUND SUCTION KIT ×1 IMPLANT
KIT BASIN OR (CUSTOM PROCEDURE TRAY) ×2 IMPLANT
KIT ROOM TURNOVER OR (KITS) ×2 IMPLANT
MANIFOLD NEPTUNE II (INSTRUMENTS) ×2 IMPLANT
NEEDLE 22X1 1/2 (OR ONLY) (NEEDLE) ×2 IMPLANT
NS IRRIG 1000ML POUR BTL (IV SOLUTION) ×2 IMPLANT
PACK TOTAL JOINT (CUSTOM PROCEDURE TRAY) ×2 IMPLANT
PAD ABD 8X10 STRL (GAUZE/BANDAGES/DRESSINGS) ×1 IMPLANT
PAD ARMBOARD 7.5X6 YLW CONV (MISCELLANEOUS) ×4 IMPLANT
PAD CAST 4YDX4 CTTN HI CHSV (CAST SUPPLIES) ×1 IMPLANT
PADDING CAST ABS 4INX4YD NS (CAST SUPPLIES) ×1
PADDING CAST ABS 6INX4YD NS (CAST SUPPLIES) ×1
PADDING CAST ABS COTTON 4X4 ST (CAST SUPPLIES) IMPLANT
PADDING CAST ABS COTTON 6X4 NS (CAST SUPPLIES) IMPLANT
PADDING CAST COTTON 4X4 STRL (CAST SUPPLIES)
PADDING CAST COTTON 6X4 STRL (CAST SUPPLIES) ×1 IMPLANT
SET HNDPC FAN SPRY TIP SCT (DISPOSABLE) ×1 IMPLANT
STAPLER VISISTAT 35W (STAPLE) ×2 IMPLANT
SURGIFLO W/THROMBIN 8M KIT (HEMOSTASIS) IMPLANT
SUT BONE WAX W31G (SUTURE) ×2 IMPLANT
SUT ETHIBOND NAB CT1 #1 30IN (SUTURE) ×4 IMPLANT
SUT MNCRL AB 3-0 PS2 18 (SUTURE) ×2 IMPLANT
SUT VIC AB 0 CT1 27 (SUTURE) ×4
SUT VIC AB 0 CT1 27XBRD ANBCTR (SUTURE) ×1 IMPLANT
SYR CONTROL 10ML LL (SYRINGE) IMPLANT
TOWEL OR 17X24 6PK STRL BLUE (TOWEL DISPOSABLE) ×2 IMPLANT
TOWEL OR 17X26 10 PK STRL BLUE (TOWEL DISPOSABLE) ×2 IMPLANT
WRAP KNEE MAXI GEL POST OP (GAUZE/BANDAGES/DRESSINGS) ×2 IMPLANT

## 2015-11-03 NOTE — Anesthesia Procedure Notes (Addendum)
Anesthesia Regional Block:  Femoral nerve block  Pre-Anesthetic Checklist: ,, timeout performed, Correct Patient, Correct Site, Correct Laterality, Correct Procedure, Correct Position, site marked, Risks and benefits discussed,  Surgical consent,  Pre-op evaluation,  At surgeon's request and post-op pain management  Laterality: Left  Prep: chloraprep       Needles:  Injection technique: Single-shot  Needle Type: Echogenic Needle     Needle Length: 9cm 9 cm Needle Gauge: 21 and 21 G    Additional Needles:  Procedures: ultrasound guided (picture in chart) Femoral nerve block Narrative:  Start time: 11/03/2015 9:55 AM End time: 11/03/2015 9:52 AM Injection made incrementally with aspirations every 5 mL.  Performed by: Personally  Anesthesiologist: Shona SimpsonHOLLIS, KEVIN D  Additional Notes: No immediate complications noted. Pt tolerated well.    Procedure Name: Intubation Date/Time: 11/03/2015 10:13 AM Performed by: Adonis HousekeeperNGELL, Mahlani Berninger M Pre-anesthesia Checklist: Patient identified, Emergency Drugs available, Suction available and Patient being monitored Patient Re-evaluated:Patient Re-evaluated prior to inductionOxygen Delivery Method: Circle system utilized Preoxygenation: Pre-oxygenation with 100% oxygen Intubation Type: IV induction Ventilation: Mask ventilation without difficulty Laryngoscope Size: Mac and 3 Grade View: Grade I Tube type: Oral Tube size: 7.0 mm Number of attempts: 1 Airway Equipment and Method: Stylet Placement Confirmation: ETT inserted through vocal cords under direct vision,  positive ETCO2 and breath sounds checked- equal and bilateral Secured at: 22 cm Tube secured with: Tape Dental Injury: Teeth and Oropharynx as per pre-operative assessment

## 2015-11-03 NOTE — Anesthesia Preprocedure Evaluation (Addendum)
Anesthesia Evaluation  Patient identified by MRN, date of birth, ID band Patient awake    Reviewed: Allergy & Precautions, NPO status , Patient's Chart, lab work & pertinent test results  Airway Mallampati: I  TM Distance: >3 FB Neck ROM: Full    Dental  (+) Teeth Intact, Caps   Pulmonary neg pulmonary ROS,    breath sounds clear to auscultation       Cardiovascular hypertension, Pt. on medications  Rhythm:Regular Rate:Normal     Neuro/Psych negative neurological ROS  negative psych ROS   GI/Hepatic negative GI ROS, Neg liver ROS,   Endo/Other  negative endocrine ROS  Renal/GU negative Renal ROS  negative genitourinary   Musculoskeletal  (+) Arthritis ,   Abdominal   Peds negative pediatric ROS (+)  Hematology negative hematology ROS (+)   Anesthesia Other Findings   Reproductive/Obstetrics negative OB ROS                            Lab Results  Component Value Date   WBC 6.4 10/28/2015   HGB 15.4 10/28/2015   HCT 44.4 10/28/2015   MCV 93.9 10/28/2015   PLT 168 10/28/2015   Lab Results  Component Value Date   CREATININE 1.06 10/28/2015   BUN 13 10/28/2015   NA 141 10/28/2015   K 4.6 10/28/2015   CL 107 10/28/2015   CO2 26 10/28/2015   No results found for: INR, PROTIME  09/2015 EKG: normal sinus rhythm, frequent PVC's noted.   Anesthesia Physical Anesthesia Plan  ASA: II  Anesthesia Plan:    Post-op Pain Management: GA combined w/ Regional for post-op pain   Induction: Intravenous  Airway Management Planned:   Additional Equipment:   Intra-op Plan:   Post-operative Plan:   Informed Consent:   Plan Discussed with:   Anesthesia Plan Comments:         Anesthesia Quick Evaluation

## 2015-11-03 NOTE — Op Note (Signed)
PATIENT ID:      Alan AlbaJagdishcha Roman  MRN:     [id number] DOB/AGE:    Apr 10, 1946 / 70 y.o.       OPERATIVE REPORT    DATE OF PROCEDURE:  11/03/2015       PREOPERATIVE DIAGNOSIS:   Osteoarthritis Left Knee-end stage                                                       Estimated body mass index is 24.11 kg/(m^2) as calculated from the following:   Height as of 10/28/15: 5\' 7"  (1.702 m).   Weight as of this encounter: 69.854 kg (154 lb).     POSTOPERATIVE DIAGNOSIS:   Osteoarthritis Left Knee -same                                                                    Estimated body mass index is 24.11 kg/(m^2) as calculated from the following:   Height as of 10/28/15: 5\' 7"  (1.702 m).   Weight as of this encounter: 69.854 kg (154 lb).     PROCEDURE:  Procedure(s):left Left Total Knee Arthroplasty     SURGEON:  Norlene CampbellPeter Else Habermann, MD    ASSISTANT:   Jacqualine CodeBrian Petrarca, PA-C   (Present and scrubbed throughout the case, critical for assistance with exposure, retraction, instrumentation, and closure.)          ANESTHESIA: regional and general     DRAINS: (left knee) Hemovact drain(s) in the clamped with  Suction Clamped :      TOURNIQUET TIME:  Total Tourniquet Time Documented: Thigh (Left) - 61 minutes Total: Thigh (Left) - 61 minutes     COMPLICATIONS:  None   CONDITION:  stable  PROCEDURE IN DETAIL: [id number]: [id number]   Alan Roman 11/03/2015, 11:48 AM

## 2015-11-03 NOTE — H&P (Signed)
  The recent History & Physical has been reviewed. I have personally examined the patient today. There is no interval change to the documented History & Physical. The patient would like to proceed with the procedure.  Norlene CampbellWHITFIELD, Elward Nocera W 11/03/2015,  9:26 AM

## 2015-11-03 NOTE — Progress Notes (Signed)
I was unable to waste midazolam 1mg . IV on the MAR. This was given for a block pre op. This waste is witnessed by Sherian ReinPam Hawks RN ...  Dia CrawfordKathie Bienvenido Proehl RN-+

## 2015-11-03 NOTE — Progress Notes (Signed)
Report given to jamie hart rn as caregiver 

## 2015-11-03 NOTE — Transfer of Care (Signed)
Immediate Anesthesia Transfer of Care Note  Patient: Alan Roman  Procedure(s) Performed: Procedure(s): Left Total Knee Arthroplasty (Left)  Patient Location: PACU  Anesthesia Type:General and Regional  Level of Consciousness: awake, alert , oriented and patient cooperative  Airway & Oxygen Therapy: Patient Spontanous Breathing and Patient connected to nasal cannula oxygen  Post-op Assessment: Report given to RN and Post -op Vital signs reviewed and stable  Post vital signs: Reviewed and stable  Last Vitals:  Filed Vitals:   11/03/15 0850 11/03/15 0956  BP: 139/80 121/70  Pulse: 71 77  Temp: 36.6 C   Resp: 20 21    Complications: No apparent anesthesia complications

## 2015-11-03 NOTE — Progress Notes (Signed)
Witnessed waste of Midazolam 1cc into trash by Dia CrawfordKathie Gallman RN.

## 2015-11-03 NOTE — Progress Notes (Signed)
Orthopedic Tech Progress Note Patient Details:  Alan AlbaJagdishcha Roman [birth year] [id number]  CPM Left Knee CPM Left Knee: On Left Knee Flexion (Degrees): 90 Left Knee Extension (Degrees): 0 Additional Comments: Trapeze bar   Alan FordyceJennifer C Treyvone Roman 11/03/2015, 1:34 PM

## 2015-11-04 ENCOUNTER — Inpatient Hospital Stay (HOSPITAL_COMMUNITY): Payer: Medicare Other

## 2015-11-04 LAB — CBC
HCT: 33.4 % — ABNORMAL LOW (ref 39.0–52.0)
Hemoglobin: 11.7 g/dL — ABNORMAL LOW (ref 13.0–17.0)
MCH: 32.6 pg (ref 26.0–34.0)
MCHC: 35 g/dL (ref 30.0–36.0)
MCV: 93 fL (ref 78.0–100.0)
Platelets: 134 10*3/uL — ABNORMAL LOW (ref 150–400)
RBC: 3.59 MIL/uL — ABNORMAL LOW (ref 4.22–5.81)
RDW: 12.3 % (ref 11.5–15.5)
WBC: 7.5 10*3/uL (ref 4.0–10.5)

## 2015-11-04 LAB — BASIC METABOLIC PANEL
Anion gap: 11 (ref 5–15)
BUN: 15 mg/dL (ref 6–20)
CO2: 24 mmol/L (ref 22–32)
Calcium: 7.8 mg/dL — ABNORMAL LOW (ref 8.9–10.3)
Chloride: 99 mmol/L — ABNORMAL LOW (ref 101–111)
Creatinine, Ser: 1.13 mg/dL (ref 0.61–1.24)
GFR calc Af Amer: >60 mL/min (ref >60)
GFR calc non Af Amer: >60 mL/min (ref >60)
Glucose, Bld: 108 mg/dL — ABNORMAL HIGH (ref 65–99)
Potassium: 4 mmol/L (ref 3.5–5.1)
Sodium: 134 mmol/L — ABNORMAL LOW (ref 135–145)

## 2015-11-04 NOTE — Progress Notes (Signed)
Orthopedic Tech Progress Note Patient Details:  Alan Name 12-02-1945 [id number] Pt. refused evening CPM. Patient ID: Liston AlbaJagdishcha Roman, male   DOB: 12-02-1945, 70 y.o.   MRN: [id number]   Lesle ChrisGilliland, Mazal Ebey L 11/04/2015, 8:40 PM

## 2015-11-04 NOTE — Anesthesia Postprocedure Evaluation (Signed)
Anesthesia Post Note  Patient: Alan Roman  Procedure(s) Performed: Procedure(s) (LRB): Left Total Knee Arthroplasty (Left)  Patient location during evaluation: PACU Anesthesia Type: General and Regional Level of consciousness: awake and alert Pain management: pain level controlled Vital Signs Assessment: post-procedure vital signs reviewed and stable Respiratory status: spontaneous breathing, nonlabored ventilation, respiratory function stable and patient connected to nasal cannula oxygen Cardiovascular status: blood pressure returned to baseline and stable Postop Assessment: no signs of nausea or vomiting Anesthetic complications: no    Last Vitals:  Filed Vitals:   11/04/15 0125 11/04/15 0430  BP: 110/68 104/73  Pulse: 84 81  Temp: 36.8 C 36.7 C  Resp: 16 20    Last Pain:  Filed Vitals:   11/04/15 0755  PainSc: 0-No pain                 Shelton SilvasKevin D Brondon Wann

## 2015-11-04 NOTE — Progress Notes (Signed)
Orthopedic Tech Progress Note Patient Details:  Alan AlbaJagdishcha Roman 01/23/1946 [id number]  Patient ID: Alan Roman, male   DOB: 01/23/1946, 70 y.o.   MRN: [id number] Placed pt's lle on cpm @0 -90 degrees @815  AM  Laruen Risser 11/04/2015, 8:11 AM

## 2015-11-04 NOTE — Op Note (Signed)
Alan Roman, Alan Roman           ACCOUNT NO.:  [id number]  MEDICAL RECORD NO.:  [id number]  LOCATION:  [id number]                        FACILITY:  [facility]  PHYSICIAN:  Vonna Kotyk. Sayyid Harewood, M.D.DATE OF BIRTH:  1946-04-01  DATE OF PROCEDURE:  11/03/2015 DATE OF DISCHARGE:                              OPERATIVE REPORT   PREOPERATIVE DIAGNOSIS:  End-stage osteoarthritis, left knee.  POSTOPERATIVE DIAGNOSIS:  End-stage osteoarthritis, left knee.  PROCEDURE:  Left total knee replacement.  SURGEON:  Vonna Kotyk. Durward Fortes, MD  ASSISTANT:  Biagio Borg, PA-C, was present throughout the operative procedure to ensure its timely completion.  ANESTHESIA:  General with supplemental femoral nerve block.  COMPLICATIONS:  None.  COMPONENTS:  DePuy LCS standard femoral component #3 rotating keeled tibial tray, 10 mm polyethylene bridging bearing, a 3-peg metal back rotating patella.  Components were secured with polymethyl methacrylate.  DESCRIPTION OF PROCEDURE:  Alan Roman was met with his wife in the holding area, identified the left knee as appropriate operative site, marked accordingly.  He received a preoperative femoral nerve block per Anesthesia.  The patient was then transported to room #7 and placed under general anesthesia without difficulty.  Left thigh tourniquet was applied.  Time- out was called.  The left lower extremity was then prepped with chlorhexidine scrub and then DuraPrep x2 from the tourniquet to the tips of the toes.  Sterile draping was performed.  A time-out was called.  Again, the left lower extremity was then elevated, Esmarch exsanguinated the proximal tourniquet at 325 mmHg.  A midline longitudinal incision was made centered about the patella extending from the superior pouch to tibial tubercle.  Via sharp dissection, incision was carried down to subcutaneous tissue.  First layer of capsule was incised in the midline.  A medial parapatellar incision was then  made with the Bovie.  The joint was entered.  There was minimal clear yellow joint effusion.  The knee was flexed to 90 degrees and patella everted 180 degrees.  There was a moderate amount of synovitis, which was resected.  He had moderate-sized osteophytes along the medial and lateral femoral condyles, almost complete loss of articular cartilage in the medial tibial plateau and medial femoral condyle.  Patient does have history of psoriatic arthritis.  The appearance was more consistent with osteoarthritis.  First, bony cut was then made transversely in the proximal tibia with a 7-degree angle of declination.  I used the external tibial guide.  After each bony cut on the tibia and the femur, I used the external guide to be sure the cuts were appropriate.  Subsequent cuts were then made on the femur using the standard femoral jig.  I used 4 degrees of distal femoral valgus cut.  Lamina spreaders were then placed along the medial and lateral compartment.  I removed medial and lateral menisci, ACL, and PCL.  MCL and LCL remained intact.  Flexion and extension gaps were perfectly symmetrical at 10 mm.  Final tapering cuts were then made on the femur with a standard jig.  Retractors were then placed around the tibia, was advanced anteriorly. I measured a #3 tibial tray, this was pinned in place.  Center hole was made followed by the keeled cut.  With the metallic tibial jig in place, I applied the trial polyethylene 10 mm bearing followed by the standard plus trial femur.  The entire construct was then reduced and through a full range of motion, remained perfectly stable.  He had full extension, no opening with varus or valgus stress, excellent alignment.  Patient had varus preop and had anatomic alignment post reconstruction.  There was no opening with a varus or valgus stress.  Negative anterior drawer sign.  The patella was prepared by removing approximately 10 mm of bone, leaving  13 mm patella thickness.  Patella jig was applied. Three holes made.  Trial patella inserted, reduced, and again through a full range of motion, it was stable.  The trial components were then removed.  The joint was copiously irrigated with saline solution.  The final components were then impacted with polymethyl methacrylate. Initially, applied the tibial tray followed by the 10-mm polyethylene bridging bearing in the femur.  Extraneous methacrylate was removed from the periphery of the components.  The patella was applied with methacrylate and a patellar clamp.  At approximately 16 minutes, the methacrylate had matured during which time, we injected the joint with 0.25% Marcaine with epinephrine.  The tourniquet was released at approximately 61 minutes.  Any gross bleeders were Bovie coagulated.  Bleeding bone was controlled with bone wax. Medium-size Hemovac was inserted.  Tranexamic acid was applied topically in a nice dry field.  The deep capsule was closed with running #1 Ethibond, superficial capsule with running 0 Vicryl, subcu with 3-0 Monocryl, skin closed with skin clips.  Sterile bulky dressing was applied followed by patient's support stocking.  The patient tolerated the procedure well without complications.     Vonna Kotyk. Durward Fortes, M.D.     PWW/MEDQ  D:  11/03/2015  T:  11/04/2015  Job:  [id number]

## 2015-11-04 NOTE — Progress Notes (Signed)
Physical Therapy Treatment Patient Details Name: Alan Roman MRN: [id number] DOB: [birth year] Today's Date: 11/04/2015    History of Present Illness 70 yo male S/P LEFT total knee replacement using cement. PMH: arthritis, HTN     PT Comments    Pt ambulated 100' with RW and min A, then became dizzy and returned to sitting with decreased BP, 86/62. PT will continue to follow.   Follow Up Recommendations  Home health PT;Supervision - Intermittent     Equipment Recommendations  Rolling walker with 5" wheels;3in1 (PT)    Recommendations for Other Services       Precautions / Restrictions Precautions Precautions: Knee Precaution Comments: watch BP Restrictions Weight Bearing Restrictions: Yes LLE Weight Bearing: Partial weight bearing LLE Partial Weight Bearing Percentage or Pounds: 50    Mobility  Bed Mobility Overal bed mobility: Needs Assistance Bed Mobility: Supine to Sit     Supine to sit: Min assist     General bed mobility comments: min A to LLE for out of bed  Transfers Overall transfer level: Needs assistance Equipment used: Rolling walker (2 wheeled) Transfers: Sit to/from Stand Sit to Stand: Min assist         General transfer comment: vc's for hand placement  Ambulation/Gait Ambulation/Gait assistance: Min assist Ambulation Distance (Feet): 100 Feet Assistive device: Rolling walker (2 wheeled) Gait Pattern/deviations: Step-through pattern;Decreased step length - left;Decreased weight shift to left Gait velocity: decreased Gait velocity interpretation: Below normal speed for age/gender General Gait Details: after 100', pt began to have some left knee buckling and stopped ambulating, he reported feeling OK but then when asked directly he admitted to dizziness. Chair brought by RN for pt to sit and BP 86/62   Stairs            Wheelchair Mobility    Modified Rankin (Stroke Patients Only)       Balance Overall balance assessment:  Needs assistance Sitting-balance support: No upper extremity supported Sitting balance-Leahy Scale: Good     Standing balance support: Bilateral upper extremity supported Standing balance-Leahy Scale: Fair                      Cognition Arousal/Alertness: Awake/alert Behavior During Therapy: WFL for tasks assessed/performed Overall Cognitive Status: Within Functional Limits for tasks assessed                      Exercises Total Joint Exercises Ankle Circles/Pumps: AROM;Both;20 reps;Seated Quad Sets: AROM;Both;20 reps;Seated Gluteal Sets: AROM;Both;20 reps;Seated Heel Slides: AAROM;Left;15 reps;Seated Hip ABduction/ADduction: AAROM;Left;10 reps;Seated Straight Leg Raises: AAROM;Left;10 reps;Seated Goniometric ROM: 10-95    General Comments        Pertinent Vitals/Pain Pain Assessment: Faces Faces Pain Scale: Hurts little more Pain Location: left knee with mobility (note: pt very stoic, will often not admit to pain but can see it on face with mvmt) Pain Descriptors / Indicators: Aching Pain Intervention(s): Limited activity within patient's tolerance;Premedicated before session;Monitored during session    Home Living                      Prior Function            PT Goals (current goals can now be found in the care plan section) Acute Rehab PT Goals Patient Stated Goal: get stronger  PT Goal Formulation: With patient Time For Goal Achievement: 11/11/15 Potential to Achieve Goals: Good Progress towards PT goals: Progressing toward goals    Frequency  7X/week    PT Plan Current plan remains appropriate    Co-evaluation             End of Session Equipment Utilized During Treatment: Gait belt Activity Tolerance: Other (comment) (limited by dizziness) Patient left: in chair;with call bell/phone within reach;with family/visitor present     Time: [id number] PT Time Calculation (min) (ACUTE ONLY): 29 min  Charges:  $Gait  Training: 8-22 mins $Therapeutic Exercise: 8-22 mins                    G Codes:     Lyanne CoVictoria Kinga Cassar, PT  Acute Rehab Services  [phone]  Lyanne CoManess, Fendi Meinhardt 11/04/2015, 4:35 PM

## 2015-11-04 NOTE — Progress Notes (Signed)
Patient ID: Alan Roman, male   DOB: 24-Jun-1946, 70 y.o.   MRN: [id number] PATIENT ID: Alan Roman        MRN:  [id number]          DOB/AGE: 07-21-1946 / 70 y.o.    Norlene Campbell, MD   Jacqualine Code, PA-C [address] [place], [place]  [address]                             [phone]   PROGRESS NOTE  Subjective:  negative for Chest Pain  negative for Shortness of Breath  negative for Nausea/Vomiting   negative for Calf Pain    Tolerating Diet: yes         Patient reports pain as mild.     Has cough and irritated throat probably related to endo tube-will obtain CXR with left lung rales  Objective: Vital signs in last 24 hours:   Patient Vitals for the past 24 hrs:  BP Temp Temp src Pulse Resp SpO2 Weight  11/04/15 0430 104/73 mmHg 98.1 F (36.7 C) Oral 81 20 100 % -  11/04/15 0125 110/68 mmHg 98.3 F (36.8 C) Oral 84 16 97 % -  11/03/15 2001 113/72 mmHg 97.9 F (36.6 C) Oral 92 18 94 % -  11/03/15 1754 121/86 mmHg 97.7 F (36.5 C) - 77 - 95 % -  11/03/15 1700 119/84 mmHg - - 76 - 96 % -  11/03/15 1600 119/84 mmHg - - - - 98 % -  11/03/15 1500 - - - 85 - 97 % -  11/03/15 1455 127/89 mmHg 97.7 F (36.5 C) - 84 - 97 % -  11/03/15 1445 121/76 mmHg 98 F (36.7 C) - 87 - 99 % -  11/03/15 1430 113/84 mmHg - - 79 - 99 % -  11/03/15 1415 116/82 mmHg - - 85 - 95 % -  11/03/15 1400 113/81 mmHg - - 83 - 100 % -  11/03/15 1345 111/71 mmHg - - 86 - 98 % -  11/03/15 1330 109/80 mmHg - - 88 (!) 28 98 % -  11/03/15 1315 105/77 mmHg - - 90 14 98 % -  11/03/15 1300 110/77 mmHg - - 90 (!) 28 96 % -  11/03/15 1245 127/81 mmHg - - 94 (!) 26 95 % -  11/03/15 1230 127/80 mmHg 97.8 F (36.6 C) - - (!) 22 - -  11/03/15 0956 121/70 mmHg - - 77 (!) 21 100 % -  11/03/15 0850 139/80 mmHg 97.8 F (36.6 C) Oral 71 20 100 % 69.854 kg (154 lb)      Intake/Output from previous day:   04/04 0701 - 04/05 0700 In: 2197 [P.O.:120; I.V.:2077] Out: 1155 [Urine:400; Drains:605]     Intake/Output this shift:       Intake/Output      04/04 0701 - 04/05 0700 04/05 0701 - 04/06 0700   P.O. 120    I.V. (mL/kg) 2077 (29.7)    Total Intake(mL/kg) 2197 (31.5)    Urine (mL/kg/hr) 400    Drains 605    Blood 150    Total Output 1155     Net +1042             LABORATORY DATA:  Recent Labs  10/28/15 0937 11/04/15 0457  WBC 6.4 7.5  HGB 15.4 11.7*  HCT 44.4 33.4*  PLT 168 134*    Recent Labs  10/28/15 0937 11/04/15 0457  NA 141 134*  K 4.6 4.0  CL 107 99*  CO2 26 24  BUN 13 15  CREATININE 1.06 1.13  GLUCOSE 96 108*  CALCIUM 9.2 7.8*   No results found for: INR, PROTIME  Recent Radiographic Studies :  Dg Chest 2 View  10/28/2015  CLINICAL DATA:  Arthroplasty.  Preoperative study. EXAM: CHEST  2 VIEW COMPARISON:  09/05/2013. FINDINGS: Mediastinum hilar structures normal. Low lung volumes with mild basilar atelectasis. No focal infiltrate. No pleural effusion or pneumothorax. Mild cardiomegaly. Normal pulmonary vascularity . Degenerative changes thoracic spine . IMPRESSION: 1.  Low lung volumes.  No acute pulmonary disease. 2.  Mild cardiomegaly.  No pulmonary venous congestion . Electronically Signed   By: Maisie Fushomas  Register   On: 10/28/2015 10:11     Examination:  General appearance: alert, cooperative and no distress  Wound Exam: clean, dry, intact   Drainage:  Moderate amount Serosanguinous exudate in hemovac  Motor Exam: EHL, FHL and Anterior Tibial Intact  Sensory Exam: Superficial Peroneal, Deep Peroneal and Tibial normal  Vascular Exam: Normal  Assessment:    1 Day Post-Op  Procedure(s) (LRB): Left Total Knee Arthroplasty (Left)  ADDITIONAL DIAGNOSIS:  Principal Problem:   Osteoarthritis of left knee Active Problems:   Psoriatic arthritis (HCC)   Essential hypertension   S/P total knee replacement using cement     Plan: Physical Therapy as ordered Partial Weight Bearing @ 50% (PWB)  DVT Prophylaxis:  Xarelto, Foot Pumps and  TED hose  DISCHARGE PLAN: Home  DISCHARGE NEEDS: HHPT, CPM, Walker and 3-in-1 comode seat   has cough with left lung rales-CXR.Marland Kitchen.voiding without difficulty. OOB with PT, dressing change and hemovac pull in am     Valeria BatmanWHITFIELD, Ruchy Wildrick W  11/04/2015 7:59 AM

## 2015-11-04 NOTE — Progress Notes (Signed)
Orthopedic Tech Progress Note Patient Details:  Alan Roman 05-29-1946 [id number] Ortho visit put on cpm at 1700 Patient ID: Alan AlbaJagdishcha Roman, male   DOB: 05-29-1946, 70 y.o.   MRN: [id number]   Alan Roman, Alan Roman 11/04/2015, 5:02 PM

## 2015-11-04 NOTE — Evaluation (Signed)
Occupational Therapy Evaluation Patient Details Name: Alan Roman MRN: [id number] DOB: 07-04-1946 Today's Date: 11/04/2015    History of Present Illness 70 yo male S/P LEFT total knee replacement using cement. PMH: arthritis, HTN    Clinical Impression   Patient presenting with decreased ADL and functional mobility independence secondary to above. Patient independent PTA. Patient currently functioning at an overall min to mod assist level. Patient will benefit from acute OT to increase overall independence in the areas of ADLs, functional mobility, and overall safety in order to safely discharge home with assistance from wife and family.   Pt with increased dizziness during OT eval. BP once seated in recliner due to dizziness=105/69, HR=74, 02 sats=97%. Notified RN of patient's complaints.     Follow Up Recommendations  No OT follow up;Supervision/Assistance - 24 hour    Equipment Recommendations  3 in 1 bedside comode (also recommending a tub transfer bench, but son states he will order one on his own)    Recommendations for Other Services  None at this time    Precautions / Restrictions Precautions Precautions: Knee;Fall Precaution Comments: reviewed  knee precautions and no pillow under knee Restrictions Weight Bearing Restrictions: Yes LLE Weight Bearing: Partial weight bearing (50%)    Mobility Bed Mobility Overal bed mobility: Needs Assistance Bed Mobility: Supine to Sit     Supine to sit: Min guard     General bed mobility comments: Hob slightly elevated, minimal use of bed rails. Cues for technique needed.   Transfers Overall transfer level: Needs assistance Equipment used: Rolling walker (2 wheeled) Transfers: Sit to/from Stand Sit to Stand: Min assist;Mod assist         General transfer comment: Pt required increased assistance due to flexed knee upon standing. Pt did have buckling of knee during functional ambulation, no KI in room or no KI ordered.  Pt required multimodal cueing for hand placement, safety, technique.     Balance Overall balance assessment: Needs assistance Sitting-balance support: No upper extremity supported;Feet supported Sitting balance-Leahy Scale: Good     Standing balance support: Bilateral upper extremity supported;During functional activity Standing balance-Leahy Scale: Fair Standing balance comment: reliant on RW    ADL Overall ADL's : Needs assistance/impaired Eating/Feeding: Set up;Sitting   Grooming: Set up;Sitting   Upper Body Bathing: Minimal assitance;Sitting   Lower Body Bathing: Minimal assistance;Sit to/from stand   Upper Body Dressing : Minimal assistance   Lower Body Dressing: Minimal assistance;Sit to/from stand   Toilet Transfer: Moderate assistance;Cueing for safety;RW;BSC Toilet Transfer Details (indicate cue type and reason): Increased assistance needed secondary to hypotension during mobility    Toileting - Clothing Manipulation Details (indicate cue type and reason): did not occur   Tub/Shower Transfer Details (indicate cue type and reason): did not occur, safety concern at this time Functional mobility during ADLs: Minimal assistance;Moderate assistance;Cueing for safety;Cueing for sequencing;Rolling walker General ADL Comments: Pt found supine in bed. Pt able to reach BLEs for LB ADLs while seated EOB. Pt with no complaints of dizziness upon sitting EOB. Pt stood with RW and no complaints of dizziness. Pt started ambulating towards BR and once in BR, LLE started to buckle and pt with increased complaints of dizziness. Therapist immediately had pt sit on [facility] (son placed this behind him). Dizziness stayed and therapist immediately brought recliner into BR and had pt perform squat pivot transfer to recliner and reclined pt's head and elevated patient's BLEs. BP checked once in recliner=105/69, HR=74, 02=97%. Notified RN of this. Discussed use  of tub transfer bench in tub/shower with  patient, patient's wife, and patient's son. Educated them on how to use that and how to use 3-n-1 over toilet seat. Encouraged pt to use 3-n-1 beside recliner today due to unsafe ambulation due to dizziness and generalized weakness. No KI in room or no KI ordered, [facility] increased knee flexion in standing and buckling noted during functional ambulation.      Vision Vision Assessment?: No apparent visual deficits          Pertinent Vitals/Pain Pain Assessment: Faces Pain Score: 4  Pain Location: LLE with movement/mobility  Pain Descriptors / Indicators: Aching;Discomfort;Grimacing;Guarding Pain Intervention(s): Limited activity within patient's tolerance;Monitored during session;Repositioned;Ice applied     Hand Dominance Right   Extremity/Trunk Assessment Upper Extremity Assessment Upper Extremity Assessment: Overall WFL for tasks assessed   Lower Extremity Assessment Lower Extremity Assessment: Defer to PT evaluation   Cervical / Trunk Assessment Cervical / Trunk Assessment: Normal   Communication Communication Communication: No difficulties   Cognition Arousal/Alertness: Awake/alert Behavior During Therapy: WFL for tasks assessed/performed Overall Cognitive Status: Within Functional Limits for tasks assessed (however, pt not talking with OT when he felt dizzy )              Home Living Family/patient expects to be discharged to:: Private residence Living Arrangements: Spouse/significant other;Children;Other relatives Available Help at Discharge: Family;Available 24 hours/day (mainly wife) Type of Home: House Home Access: Level entry     Home Layout: One level     Bathroom Shower/Tub: Tub/shower unit;Curtain   Bathroom Toilet: Standard     Home Equipment: None   Prior Functioning/Environment Level of Independence: Independent     OT Diagnosis: Generalized weakness;Acute pain   OT Problem List: Decreased strength;Decreased  range of motion;Decreased activity tolerance;Impaired balance (sitting and/or standing);Decreased coordination;Decreased safety awareness;Decreased knowledge of use of DME or AE;Decreased knowledge of precautions;Pain   OT Treatment/Interventions: Self-care/ADL training;Therapeutic exercise;Energy conservation;DME and/or AE instruction;Therapeutic activities;Patient/family education;Balance training    OT Goals(Current goals can be found in the care plan section) Acute Rehab OT Goals Patient Stated Goal: get stronger  OT Goal Formulation: With patient/family Time For Goal Achievement: 11/18/15 Potential to Achieve Goals: Good ADL Goals Pt Will Perform Grooming: with supervision;standing Pt Will Perform Lower Body Bathing: with supervision;sit to/from stand Pt Will Perform Lower Body Dressing: with supervision;sit to/from stand Pt Will Transfer to Toilet: with supervision;ambulating;bedside commode Pt Will Perform Tub/Shower Transfer: Tub transfer;tub bench;rolling walker;ambulating;with supervision Additional ADL Goal #1: Pt will be supervision for funcational ambulation/mobility using RW during ADL   OT Frequency: Min 2X/week   Barriers to D/C: none known at this time   End of Session Equipment Utilized During Treatment: Gait belt;Rolling walker CPM Left Knee CPM Left Knee: Off Nurse Communication: Mobility status;Weight bearing status  Activity Tolerance: Patient tolerated treatment well Patient left: in chair;with call bell/phone within reach;with family/visitor present   Time: [id number] OT Time Calculation (min): 36 min Charges:  OT General Charges $OT Visit: 1 Procedure OT Evaluation $OT Eval Moderate Complexity: 1 Procedure OT Treatments $Self Care/Home Management : 8-22 mins  Edwin CapPatricia Nita Whitmire , MS, OTR/L, CLT  Pager: [phone]  11/04/2015, 10:15 AM

## 2015-11-04 NOTE — Care Management Note (Signed)
Case Management Note  Patient Details  Name: Alan Roman Single MRN: [id number] Date of Birth: [birth year]  Subjective/Objective:                    Action/Plan:  Confirmed face sheet information with son at bedside . [place] . Family would like a little time to decide on agency , left list with family. Will continue to follow up . Expected Discharge Date:                  Expected Discharge Plan:  Home w Home Health Services  In-House Referral:     Discharge planning Services  CM Consult  Post Acute Care Choice:  Durable Medical Equipment, Home Health Choice offered to:  Patient, Spouse, Adult Children  DME Arranged:  Walker, 3-N-1, Continuous passive motion machine DME Agency:  [facility] [employer]  HH Arranged:  PT HH Agency:     Status of Service:  In process, will continue to follow  Medicare Important Message Given:    Date Medicare IM Given:    Medicare IM give by:    Date Additional Medicare IM Given:    Additional Medicare Important Message give by:     If discussed at Long Length of Stay Meetings, dates discussed:    Additional Comments:  [name], [name], RN 11/04/2015, 2:57 PM

## 2015-11-04 NOTE — Evaluation (Signed)
Physical Therapy Evaluation Patient Details Name: Alan AlbaJagdishcha Roman MRN: [id number] DOB: 1945/12/18 Today's Date: 11/04/2015   History of Present Illness  [age] yo male S/P LEFT total knee replacement using cement. PMH: arthritis, HTN   Clinical Impression  Pt is s/p TKA resulting in the deficits listed below (see PT Problem List). Pt ambulated 30' with RW and min A, no dizziness. Did have buckling with initial standing but improved with distance and concentration on quad contraction.  Pt will benefit from skilled PT to increase their independence and safety with mobility to allow discharge to the venue listed below.      Follow Up Recommendations Home health PT;Supervision - Intermittent    Equipment Recommendations  Rolling walker with 5" wheels;3in1 (PT)    Recommendations for Other Services       Precautions / Restrictions Precautions Precautions: Knee Precaution Comments: reviewed proper positioning and WB status Restrictions Weight Bearing Restrictions: Yes LLE Weight Bearing: Partial weight bearing LLE Partial Weight Bearing Percentage or Pounds: 50      Mobility  Bed Mobility Overal bed mobility: Needs Assistance Bed Mobility: Sit to Supine     Supine to sit: Min guard Sit to supine: Min assist   General bed mobility comments: min A to LLE for return to bed and positioning in CPM  Transfers Overall transfer level: Needs assistance Equipment used: Rolling walker (2 wheeled) Transfers: Sit to/from Stand Sit to Stand: Min assist         General transfer comment: Min A for sit to stand from chair, knee initially buckling but was able to begin to activate quad and prevent this with vc's and practice. No dizziness this time up  Ambulation/Gait Ambulation/Gait assistance: Min assist Ambulation Distance (Feet): 30 Feet Assistive device: Rolling walker (2 wheeled) Gait Pattern/deviations: Step-to pattern Gait velocity: decreased Gait velocity interpretation: Below  normal speed for age/gender General Gait Details: vc's for sequencing, ambulation improved with distance. vc's for relaxing shoulders and using triceps for support with RW. Reinforced 50% WB  Stairs            Wheelchair Mobility    Modified Rankin (Stroke Patients Only)       Balance Overall balance assessment: Needs assistance Sitting-balance support: No upper extremity supported Sitting balance-Leahy Scale: Good     Standing balance support: Single extremity supported Standing balance-Leahy Scale: Fair Standing balance comment: reliant on RW                             Pertinent Vitals/Pain Pain Assessment: Faces Pain Score: 4  Faces Pain Scale: Hurts little more Pain Location: grimacing with stepping Pain Descriptors / Indicators: Grimacing Pain Intervention(s): Limited activity within patient's tolerance;Monitored during session;Premedicated before session    Home Living Family/patient expects to be discharged to:: Private residence Living Arrangements: Spouse/significant other;Children;Other relatives Available Help at Discharge: Family;Available 24 hours/day Type of Home: House Home Access: Level entry     Home Layout: One level Home Equipment: None      Prior Function Level of Independence: Independent               Hand Dominance   Dominant Hand: Right    Extremity/Trunk Assessment   Upper Extremity Assessment: Defer to OT evaluation           Lower Extremity Assessment: LLE deficits/detail   LLE Deficits / Details: hip flex 2/5, knee ext 2/5  Cervical / Trunk Assessment: Normal  Communication  Communication: No difficulties  Cognition Arousal/Alertness: Awake/alert Behavior During Therapy: WFL for tasks assessed/performed Overall Cognitive Status: Within Functional Limits for tasks assessed                      General Comments General comments (skin integrity, edema, etc.): CPM on 0-90    Exercises  Total Joint Exercises Goniometric ROM: 0-95      Assessment/Plan    PT Assessment Patient needs continued PT services  PT Diagnosis Difficulty walking;Abnormality of gait;Acute pain   PT Problem List Decreased strength;Decreased activity tolerance;Decreased mobility;Decreased knowledge of use of DME;Decreased knowledge of precautions;Pain  PT Treatment Interventions DME instruction;Gait training;Functional mobility training;Therapeutic activities;Therapeutic exercise;Patient/family education;Balance training;Neuromuscular re-education   PT Goals (Current goals can be found in the Care Plan section) Acute Rehab PT Goals Patient Stated Goal: get stronger  PT Goal Formulation: With patient Time For Goal Achievement: 11/11/15 Potential to Achieve Goals: Good    Frequency 7X/week   Barriers to discharge        Co-evaluation               End of Session Equipment Utilized During Treatment: Gait belt Activity Tolerance: Patient tolerated treatment well Patient left: in bed;in CPM;with call bell/phone within reach;with family/visitor present           Time: 1610-9604 PT Time Calculation (min) (ACUTE ONLY): 25 min   Charges:   PT Evaluation $PT Eval Low Complexity: 1 Procedure PT Treatments $Gait Training: 8-22 mins   PT G Codes:      Lyanne Co, PT  Acute Rehab Services  [phone]   Lyanne Co 11/04/2015, 11:50 AM

## 2015-11-04 NOTE — Progress Notes (Signed)
Orthopedic Tech Progress Note Patient Details:  Alan Roman 23-Sep-1945 [id number]  Patient ID: Alan Roman, male   DOB: 23-Sep-1945, 70 y.o.   MRN: [id number] Pt. refused cpm. Will call when ready. Alerted rn.   [name], Alan Peer J 11/04/2015, 6:32 AM

## 2015-11-05 ENCOUNTER — Encounter (HOSPITAL_COMMUNITY): Payer: Self-pay | Admitting: Orthopaedic Surgery

## 2015-11-05 DIAGNOSIS — M1712 Unilateral primary osteoarthritis, left knee: Secondary | ICD-10-CM

## 2015-11-05 DIAGNOSIS — D696 Thrombocytopenia, unspecified: Secondary | ICD-10-CM | POA: Diagnosis present

## 2015-11-05 DIAGNOSIS — Z96652 Presence of left artificial knee joint: Secondary | ICD-10-CM

## 2015-11-05 DIAGNOSIS — I951 Orthostatic hypotension: Secondary | ICD-10-CM | POA: Diagnosis present

## 2015-11-05 DIAGNOSIS — L4050 Arthropathic psoriasis, unspecified: Secondary | ICD-10-CM

## 2015-11-05 DIAGNOSIS — I10 Essential (primary) hypertension: Secondary | ICD-10-CM

## 2015-11-05 DIAGNOSIS — Z8611 Personal history of tuberculosis: Secondary | ICD-10-CM | POA: Diagnosis present

## 2015-11-05 DIAGNOSIS — D649 Anemia, unspecified: Secondary | ICD-10-CM | POA: Diagnosis present

## 2015-11-05 DIAGNOSIS — K5900 Constipation, unspecified: Secondary | ICD-10-CM | POA: Diagnosis present

## 2015-11-05 LAB — HEPATIC FUNCTION PANEL
ALT: 12 U/L — ABNORMAL LOW (ref 17–63)
AST: 27 U/L (ref 15–41)
Albumin: 2.6 g/dL — ABNORMAL LOW (ref 3.5–5.0)
Alkaline Phosphatase: 53 U/L (ref 38–126)
Bilirubin, Direct: 0.2 mg/dL (ref 0.1–0.5)
Indirect Bilirubin: 0.7 mg/dL (ref 0.3–0.9)
Total Bilirubin: 0.9 mg/dL (ref 0.3–1.2)
Total Protein: 5.8 g/dL — ABNORMAL LOW (ref 6.5–8.1)

## 2015-11-05 LAB — URINALYSIS, ROUTINE W REFLEX MICROSCOPIC
Bilirubin Urine: NEGATIVE
Glucose, UA: NEGATIVE mg/dL
Ketones, ur: NEGATIVE mg/dL
Leukocytes, UA: NEGATIVE
Nitrite: NEGATIVE
Protein, ur: NEGATIVE mg/dL
Specific Gravity, Urine: 1.008 (ref 1.005–1.030)
pH: 7 (ref 5.0–8.0)

## 2015-11-05 LAB — CBC
HCT: 31.3 % — ABNORMAL LOW (ref 39.0–52.0)
Hemoglobin: 11 g/dL — ABNORMAL LOW (ref 13.0–17.0)
MCH: 32.7 pg (ref 26.0–34.0)
MCHC: 35.1 g/dL (ref 30.0–36.0)
MCV: 93.2 fL (ref 78.0–100.0)
Platelets: 123 10*3/uL — ABNORMAL LOW (ref 150–400)
RBC: 3.36 MIL/uL — ABNORMAL LOW (ref 4.22–5.81)
RDW: 12.4 % (ref 11.5–15.5)
WBC: 7.8 10*3/uL (ref 4.0–10.5)

## 2015-11-05 LAB — CORTISOL: Cortisol, Plasma: 14.2 ug/dL

## 2015-11-05 LAB — BASIC METABOLIC PANEL
Anion gap: 10 (ref 5–15)
BUN: 10 mg/dL (ref 6–20)
CO2: 26 mmol/L (ref 22–32)
Calcium: 7.9 mg/dL — ABNORMAL LOW (ref 8.9–10.3)
Chloride: 100 mmol/L — ABNORMAL LOW (ref 101–111)
Creatinine, Ser: 1.01 mg/dL (ref 0.61–1.24)
GFR calc Af Amer: >60 mL/min (ref >60)
GFR calc non Af Amer: >60 mL/min (ref >60)
Glucose, Bld: 104 mg/dL — ABNORMAL HIGH (ref 65–99)
Potassium: 3.5 mmol/L (ref 3.5–5.1)
Sodium: 136 mmol/L (ref 135–145)

## 2015-11-05 LAB — LACTIC ACID, PLASMA
Lactic Acid, Venous: 2.3 mmol/L (ref 0.5–2.0)
Lactic Acid, Venous: 2 mmol/L (ref 0.5–2.0)

## 2015-11-05 LAB — URINE MICROSCOPIC-ADD ON: Bacteria, UA: NONE SEEN

## 2015-11-05 LAB — PROCALCITONIN: Procalcitonin: 0.12 ng/mL

## 2015-11-05 MED ORDER — OXYCODONE HCL 5 MG PO TABS: 5.0000 mg | ORAL_TABLET | ORAL | Status: DC | PRN

## 2015-11-05 MED ORDER — SODIUM CHLORIDE 0.9 % IV BOLUS (SEPSIS)
1000.0000 mL | Freq: Once | INTRAVENOUS | Status: AC
  Administered 2015-11-05: 1000 mL via INTRAVENOUS

## 2015-11-05 MED ORDER — SODIUM CHLORIDE 0.9 % IV SOLN
INTRAVENOUS | Status: DC
  Administered 2015-11-05 (×2): via INTRAVENOUS

## 2015-11-05 MED ORDER — POLYETHYLENE GLYCOL 3350 17 G PO PACK
17.0000 g | PACK | Freq: Every day | ORAL | Status: DC
  Administered 2015-11-05: 17 g via ORAL
  Filled 2015-11-05 (×2): qty 1

## 2015-11-05 MED ORDER — MAGNESIUM HYDROXIDE 400 MG/5ML PO SUSP
15.0000 mL | Freq: Once | ORAL | Status: AC
  Administered 2015-11-05: 15 mL via ORAL
  Filled 2015-11-05: qty 30

## 2015-11-05 MED ORDER — METHOCARBAMOL 500 MG PO TABS: 500.0000 mg | ORAL_TABLET | Freq: Three times a day (TID) | ORAL | Status: DC | PRN

## 2015-11-05 MED ORDER — SODIUM CHLORIDE 0.9 % IV BOLUS (SEPSIS)
500.0000 mL | Freq: Once | INTRAVENOUS | Status: AC
  Administered 2015-11-05: 500 mL via INTRAVENOUS

## 2015-11-05 MED ORDER — RIVAROXABAN 10 MG PO TABS: 10.0000 mg | ORAL_TABLET | Freq: Every day | ORAL | Status: DC

## 2015-11-05 NOTE — Consult Note (Signed)
[facility] Consultation Note                                                                                    Alan Roman, is a 70 y.o. male  MRN: [id number]   DOB - 26-Dec-1945  Admit Date - 11/03/2015  Outpatient Primary MD for the patient is VYAS,DHRUV B., MD  Requesting MD: Cleophas Dunker / Orthopedics  Reason for consultation: Evaluation of orthostatic hypotension  PMH: Past Medical History  Diagnosis Date  . Hypertension   . History of hiatal hernia 2007; 2009  . Arthritis     "knees" (11/03/2015)      PSH: Past Surgical History  Procedure Laterality Date  . Video bronchoscopy Bilateral 01/31/2013    Procedure: VIDEO BRONCHOSCOPY WITHOUT FLUORO;  Surgeon: Merwyn Katos, MD;  Location: [facility] ENDOSCOPY;  Service: Cardiopulmonary;  Laterality: Bilateral;  . Total knee arthroplasty Left 11/03/2015  . Joint replacement    . Hiatal hernia repair  2007; 2009  . Hernia repair    . Cataract extraction w/ intraocular lens  implant, bilateral Bilateral   . Total knee arthroplasty Left 11/03/2015    Procedure: Left Total Knee Arthroplasty;  Surgeon: Valeria Batman, MD;  Location: [facility] OR;  Service: Orthopedics;  Laterality: Left;    HPI: This is a pleasant 70 year old male patient admitted electively to the orthopedic service on 4/2 to undergo left TKA. Patient has underlying psoriasis with psoriatic arthritis as well as hypertension and a history of extrapulmonary tuberculosis on chronic suppressive therapy. At time of admission patient was normotensive and had a normal platelet count. Postoperatively he was started on Xarelto for DVT prophylaxis. Patient endorses poor oral intake postoperatively and increasing pain. In review of the intake and output patient is -2900 mL. His platelets have also decreased to 123,000 in the past 48 hours. Patient has had suboptimal blood pressures over the past several days and nursing staff have held his Norvasc and Cozaar today as well as on 4/4.  because of this. Orthopedic surgeon checked orthostatic vital signs on the patient and he was profoundly orthostatic with systolic blood pressure dropping from 101 to 58. The surgeon has initiated to saline fluid challenge and asked [place] to combine evaluate the patient regarding the hypotension. Patient reports generalized weakness as well as some dizziness with activity while upright.   Review of Systems   In addition to the HPI above,  No Fever-chills, myalgias or other constitutional symptoms No Headache, changes with Vision or hearing, new weakness, tingling, numbness in any extremity, No problems swallowing food or Liquids, indigestion/reflux No Chest pain, Cough or Shortness of Breath, palpitations, orthopnea or DOE No Abdominal pain, N/V; no melena or hematochezia, no dark tarry stools-as not had a bowel movement since date of surgery No dysuria, hematuria or flank pain No new skin rashes, lesions, masses or bruises, No recent weight gain or loss No polyuria, polydypsia or polyphagia,  *A full 10 point Review of Systems was done, except as stated above, all other Review of Systems were negative.  Social History Social History  Substance Use Topics  . Smoking status: Never Smoker   . Smokeless tobacco: Never  Used  . Alcohol Use: No    Resides at: Private residence  Lives with: Spouse  Ambulatory status: Currently requires rolling walker   Family History History reviewed. No pertinent family history.   Prior to Admission medications   Medication Sig Start Date End Date Taking? Authorizing Provider  amLODipine (NORVASC) 5 MG tablet Take 5 mg by mouth daily.   Yes Historical Provider, MD  Apremilast (OTEZLA) 30 MG TABS Take 1 tablet by mouth 2 (two) times daily.   Yes Historical Provider, MD  losartan (COZAAR) 25 MG tablet Take 25 mg by mouth daily.   Yes Historical Provider, MD  chlorpheniramine-HYDROcodone (TUSSIONEX PENNKINETIC ER) 10-8 MG/5ML LQCR Take 5 mLs by mouth  every 12 (twelve) hours. Take for 4 days then use as needed for cough. Patient not taking: Reported on 10/27/2015 02/02/13   Rodolph Bong, MD  ethambutol (MYAMBUTOL) 400 MG tablet Take 3 tablets (1,200 mg total) by mouth daily. Patient not taking: Reported on 10/27/2015 02/02/13   Rodolph Bong, MD  feeding supplement (RESOURCE BREEZE) LIQD Take 1 Container by mouth 2 (two) times daily between meals. otc Patient not taking: Reported on 10/27/2015 02/02/13   Rodolph Bong, MD  isoniazid (NYDRAZID) 300 MG tablet Take 1 tablet (300 mg total) by mouth daily. Patient not taking: Reported on 10/27/2015 02/02/13   Rodolph Bong, MD  methocarbamol (ROBAXIN) 500 MG tablet Take 1 tablet (500 mg total) by mouth every 8 (eight) hours as needed for muscle spasms. 11/05/15   Jetty Peeks, PA-C  oxyCODONE (OXY IR/ROXICODONE) 5 MG immediate release tablet Take 1-2 tablets (5-10 mg total) by mouth every 4 (four) hours as needed for breakthrough pain. 11/05/15   Jetty Peeks, PA-C  pyrazinamide 500 MG tablet Take 3 tablets (1,500 mg total) by mouth daily. Patient not taking: Reported on 10/27/2015 02/02/13   Rodolph Bong, MD  pyridOXINE (B-6) 50 MG tablet Take 1 tablet (50 mg total) by mouth daily. Patient not taking: Reported on 10/27/2015 02/02/13   Rodolph Bong, MD  rifampin (RIFADIN) 300 MG capsule Take 2 capsules (600 mg total) by mouth daily. Patient not taking: Reported on 10/27/2015 02/02/13   Rodolph Bong, MD  rivaroxaban (XARELTO) 10 MG TABS tablet Take 1 tablet (10 mg total) by mouth daily with breakfast. 11/05/15   Jetty Peeks, PA-C    No Known Allergies  Physical Exam  Vitals  Blood pressure 78/61, pulse 93, temperature 99.1 F (37.3 C), temperature source Oral, resp. rate 16, weight 154 lb (69.854 kg), SpO2 98 %.   General:  In no acute distress, appears healthy and well nourished  Psych:  Normal affect, Denies Suicidal or Homicidal ideations, Awake Alert, Oriented X 3.  Speech and thought patterns are clear and appropriate, no apparent short term memory deficits  Neuro:   No focal neurological deficits, CN II through XII intact, Strength 5/5 all 4 extremities, Sensation intact all 4 extremities.  ENT:  Ears and Eyes appear Normal, Conjunctivae clear, PER. Moist oral mucosa without erythema or exudates.  Neck:  Supple, No lymphadenopathy appreciated  Respiratory:  Symmetrical chest wall movement, Good air movement bilaterally, CTAB. Room Air  Cardiac:  RRR, No Murmurs, no LE edema noted, no JVD, No carotid bruits, peripheral pulses palpable at 2+  Abdomen:  Positive bowel sounds, Soft, Non tender, Non distended,  No masses appreciated, no obvious hepatosplenomegaly  Skin:  No Cyanosis, Normal Skin Turgor, No Skin Rash or Bruise.  Extremities: Symmetrical without obvious trauma or injury except for surgical incision with dressing-there is mild tenderness peri-incision line which is also to be expected,  no effusions.  Data Review  CBC  Recent Labs Lab 11/04/15 0457 11/05/15 0615  WBC 7.5 7.8  HGB 11.7* 11.0*  HCT 33.4* 31.3*  PLT 134* 123*  MCV 93.0 93.2  MCH 32.6 32.7  MCHC 35.0 35.1  RDW 12.3 12.4    Chemistries   Recent Labs Lab 11/04/15 0457 11/05/15 0615  NA 134* 136  K 4.0 3.5  CL 99* 100*  CO2 24 26  GLUCOSE 108* 104*  BUN 15 10  CREATININE 1.13 1.01  CALCIUM 7.8* 7.9*    estimated creatinine clearance is 62.7 mL/min (by C-G formula based on Cr of 1.01).  No results for input(s): TSH, T4TOTAL, T3FREE, THYROIDAB in the last 72 hours.  Invalid input(s): FREET3  Coagulation profile No results for input(s): INR, PROTIME in the last 168 hours.  No results for input(s): DDIMER in the last 72 hours.  Cardiac Enzymes No results for input(s): CKMB, TROPONINI, MYOGLOBIN in the last 168 hours.  Invalid input(s): CK  Invalid input(s): POCBNP  Urinalysis    Component Value Date/Time   COLORURINE AMBER* 10/28/2015 0936    APPEARANCEUR CLOUDY* 10/28/2015 0936   LABSPEC 1.023 10/28/2015 0936   PHURINE 6.0 10/28/2015 0936   GLUCOSEU NEGATIVE 10/28/2015 0936   HGBUR NEGATIVE 10/28/2015 0936   BILIRUBINUR SMALL* 10/28/2015 0936   KETONESUR 15* 10/28/2015 0936   PROTEINUR NEGATIVE 10/28/2015 0936   NITRITE NEGATIVE 10/28/2015 0936   LEUKOCYTESUR TRACE* 10/28/2015 0936    Imaging results:   Dg Chest 2 View  11/04/2015  CLINICAL DATA:  Cough and wheezing. EXAM: CHEST  2 VIEW COMPARISON:  10/28/2015. FINDINGS: Low lung volumes with mild basilar atelectasis. Cardiomegaly with normal pulmonary vascularity. No pleural effusion or pneumothorax. IMPRESSION: 1.  Low lung volumes with mild basilar atelectasis. 2. Cardiomegaly.  No evidence CHF.  Exam stable from 10/28/2015. Electronically Signed   By: Maisie Fushomas  Register   On: 11/04/2015 08:52   Dg Chest 2 View  10/28/2015  CLINICAL DATA:  Arthroplasty.  Preoperative study. EXAM: CHEST  2 VIEW COMPARISON:  09/05/2013. FINDINGS: Mediastinum hilar structures normal. Low lung volumes with mild basilar atelectasis. No focal infiltrate. No pleural effusion or pneumothorax. Mild cardiomegaly. Normal pulmonary vascularity . Degenerative changes thoracic spine . IMPRESSION: 1.  Low lung volumes.  No acute pulmonary disease. 2.  Mild cardiomegaly.  No pulmonary venous congestion . Electronically Signed   By: Maisie Fushomas  Register   On: 10/28/2015 10:11     Assessment & Plan  Principal Problem:   Orthostatic hypotension -Postoperative orthostatic hypotension and patient only mild anemia and no overt signs of bleeding but with documented poor oral intake who was in a negative fluid balance of 2900 mL plus -We'll give 1 L IV fluids now and repeat orthostatic vital signs if improved will just continue fluids at 150 mL per hour but if remains orthostatic will give another bolus **Repeat orthostatic vital signs mildly positive so we'll give an additional 500 mL bolus and continue IV fluids as  previously stated -Does not appear to be septic based on labs and has not been febrile and currently does not have leukocytosis although has also received Ancef in the postoperative period -Chek Procalcitonin, blood cultures, urinalysis and culture, and cortisol **I also checked lactic acid which was mildly elevated at 2.4 and likely related to low perfusion setting of orthostatic  hypotension -No chest pain but will check EKG as precaution (silent MI post op) -Patient currently alert and conversant and stable enough to remain on current medical floor i.e. does not need to be upgraded to higher level of care  Active Problems:   Essential hypertension -Currently blood pressure suboptimal with orthostasis several discontinued Cozaar and Norvasc -These medications may need to be held until patient follows up with primary care physician after discharge    Thrombocytopenia  -New problem and likely related to Xarelto -Continue to follow -History of prior transaminitis so we'll check LFTs but may be elevated in setting of recent orthostatic hypotension    Extrapulmonary TB  -Continue preadmission suppressive therapies    Psoriatic arthritis left knee/S/P total knee replacement using cement -Preadmission Otezla and to be resumed at discretion of primary team    Constipation -Patient reports has not had bowel movement since morning of surgery -Continue stool softeners and add nocturnal MiraLAX -1 time dose milk of magnesia    Normocytic anemia -Chronic and likely related to patient's underlying vegetarianism    DVT Prophylaxis: Xarelto  Family Communication:   Multiple family members including spouse at bedside with patient's permission  Code Status:  Full code  Condition:  Stable  Discharge disposition: At discretion of primary team- our recommendation to make sure patient's platelet remained stable without further decreasing and orthostatic hypotension has resolved for at least 24  hours before discharge  Time spent in minutes : 60      ELLIS,ALLISON L. ANP on 11/05/2015 at 3:34 PM  You may contact me by going to [email or web address] - password [email or web address]  I am available from 7a-7p but please confirm I am on the schedule by going to Amion as above.   After 7p please contact night coverage person covering me after hours  [facility]

## 2015-11-05 NOTE — Progress Notes (Signed)
Received a critical lactic acid level of 2.3. NP Rennis HardingEllis paged and waiting for response

## 2015-11-05 NOTE — Progress Notes (Signed)
Occupational Therapy Treatment Patient Details Name: Alan AlbaJagdishcha Roman MRN: [id number] DOB: January 10, 1946 Today's Date: 11/05/2015    History of present illness 70 yo male S/P LEFT total knee replacement using cement. PMH: arthritis, HTN    OT comments  Patient continues to make slow progress. Pt continues to be somewhat limited by orthostatic hypotension. Decreased pain and pt not as cold during this afternoon session. Pt will benefit from another OT session for more education on basic BR transfers for toileting and showering.     Follow Up Recommendations  Supervision/Assistance - 24 hour;Home health OT    Equipment Recommendations  3 in 1 bedside comode;Tub/shower bench    Recommendations for Other Services  None at this time  Precautions / Restrictions Precautions Precautions: Knee;Fall Precaution Comments: watch BP Restrictions Weight Bearing Restrictions: Yes LLE Weight Bearing: Partial weight bearing LLE Partial Weight Bearing Percentage or Pounds: 50     Mobility Bed Mobility Overal bed mobility: Needs Assistance Bed Mobility: Sit to Supine     Supine to sit: Min assist Sit to supine: Min assist   General bed mobility comments: Min assist for mangagement of LLE  Transfers Overall transfer level: Needs assistance Equipment used: Rolling walker (2 wheeled) Transfers: Sit to/from Stand Sit to Stand: Min assist   General transfer comment: Heavy min assist to come into standing from recliner. Cues for hand placement, technique, sequencing.     Balance Overall balance assessment: Needs assistance Sitting-balance support: No upper extremity supported;Feet supported Sitting balance-Leahy Scale: Good     Standing balance support: Bilateral upper extremity supported;During functional activity Standing balance-Leahy Scale: Fair Standing balance comment: reliant on RW   ADL Overall ADL's : Needs assistance/impaired Eating/Feeding: Set up;Sitting   Grooming: Set  up;Sitting   Upper Body Bathing: Minimal assitance;Sitting   Lower Body Bathing: Minimal assistance;Sit to/from stand   Upper Body Dressing : Minimal assistance;Sitting   Lower Body Dressing: Minimal assistance;Sit to/from stand   Toilet Transfer: Minimal Cabin crewassistance;Stand-pivot Toilet Transfer Details (indicate cue type and reason): simulated recliner to EOB   Toileting - Clothing Manipulation Details (indicate cue type and reason): did not occur   Tub/Shower Transfer Details (indicate cue type and reason): did not occur, safety concern at this time Functional mobility during ADLs: Minimal assistance;Moderate assistance;Cueing for safety;Cueing for sequencing;Rolling walker General ADL Comments: Pt found seated in recliner. Pt stated he has been in recliner since therapy this am. Pt willing and eager to get back to bed. Pt stated he was feeling better this afternoon. Pt sat up in recliner with feet dangling and BP=113/64. Pt then stood from recliner and no complaints of dizziness. Assisted pt to EOB to supine and pt stated minimal to no dizziness during this. Therapist set-up CPM and notified RN to change settings, RN to contact [employer]otech to make sure CPM at correct settings.            Cognition   Behavior During Therapy: WFL for tasks assessed/performed Overall Cognitive Status: Within Functional Limits for tasks assessed                 Pertinent Vitals/ Pain       Pain Assessment: Faces Pain Score: 4  Faces Pain Scale: Hurts little more Pain Location: L KNEE Pain Descriptors / Indicators: Aching;Sore;Guarding Pain Intervention(s): Limited activity within patient's tolerance;Monitored during session;Repositioned   Frequency Min 2X/week     Progress Toward Goals  OT Goals(current goals can now befound in the care plan section)  Progress towards OT  goals: Progressing toward goals  Acute Rehab OT Goals Patient Stated Goal: get stronger  OT Goal Formulation: With  patient/family Time For Goal Achievement: 11/18/15 Potential to Achieve Goals: Good  Plan Discharge plan remains appropriate    End of Session Equipment Utilized During Treatment: Gait belt;Rolling walker CPM Left Knee CPM Left Knee: Off (pt denied CPM end of session)   Activity Tolerance Patient tolerated treatment well   Patient Left in bed;with call bell/phone within reach;with family/visitor present   Nurse Communication Mobility status;Other (comment) (BP and need for CPM)       Time: 9604-5409 OT Time Calculation (min): 31 min  Charges: OT General Charges $OT Visit: 1 Procedure OT Treatments $Self Care/Home Management : 8-22 mins $Therapeutic Activity: 8-22 mins  Edwin Cap , MS, OTR/L, [place] Pager: [phone]  11/05/2015, 4:02 PM

## 2015-11-05 NOTE — Care Management Note (Signed)
Case Management Note  Patient Details  Name: Alan Roman MRN: [id number] Date of Birth: April 24, 1946  Subjective/Objective:                    Action/Plan:   Expected Discharge Date:                  Expected Discharge Plan:  Home w Home Health Services  In-House Referral:     Discharge planning Services  CM Consult  Post Acute Care Choice:  Durable Medical Equipment, Home Health Choice offered to:  Patient, Spouse, Adult Children  DME Arranged:  Dan HumphreysWalker, 3-N-1, Continuous passive motion machine DME Agency:  [facility] [employer]  HH Arranged:  PT HH Agency:  [facility]  Status of Service:  Completed, signed off  Medicare Important Message Given:    Date Medicare IM Given:    Medicare IM give by:    Date Additional Medicare IM Given:    Additional Medicare Important Message give by:     If discussed at Long Length of Stay Meetings, dates discussed:    Additional Comments:  Alan Roman, Alan Mondesir Marie, RN 11/05/2015, 11:59 AM

## 2015-11-05 NOTE — Progress Notes (Signed)
Physical Therapy Treatment Patient Details Name: Alan Roman MRN: [id number] DOB: 1945-10-04 Today's Date: 11/05/2015    History of Present Illness 70 yo male S/P LEFT total knee replacement using cement. PMH: arthritis, HTN     PT Comments    Pt very pleasant with increased pain, decreased ROM and activity tolerance today. Pt continues to demonstrate significant orthostatic hypotension with activity to the point he was unable to attempt ambulation or transfer OOB to chair this AM. Pt and spouse educated for HEP, transfers, hypotension and progression. RN notified of vitals and pt return to bed. Will attempt again today.  Orthostatic BPs  Supine 89/67  Sitting 101/68     Standing 78/65  Standing after 3 min 58/50     Follow Up Recommendations        Equipment Recommendations       Recommendations for Other Services       Precautions / Restrictions Precautions Precautions: Knee Precaution Comments: watch BP Restrictions LLE Weight Bearing: Partial weight bearing LLE Partial Weight Bearing Percentage or Pounds: 50    Mobility  Bed Mobility Overal bed mobility: Needs Assistance Bed Mobility: Supine to Sit;Sit to Supine     Supine to sit: Modified independent (Device/Increase time) Sit to supine: Min assist   General bed mobility comments: pt using RUE to assist LLE fully to EOB. Assist to bring legs back onto surface  Transfers Overall transfer level: Needs assistance   Transfers: Sit to/from Stand Sit to Stand: Supervision         General transfer comment: cues for hand placement and safety. Pt limited by orthostatic hypotension   Ambulation/Gait Ambulation/Gait assistance:  (unable due to hypotension)               Stairs            Wheelchair Mobility    Modified Rankin (Stroke Patients Only)       Balance                                    Cognition Arousal/Alertness: Awake/alert Behavior During Therapy:  WFL for tasks assessed/performed Overall Cognitive Status: Within Functional Limits for tasks assessed                      Exercises Total Joint Exercises Heel Slides: AROM;Left;10 reps;Supine Hip ABduction/ADduction: AROM;Left;15 reps;Supine Straight Leg Raises: Left;Supine;15 reps;AAROM Long Arc Quad: Left;Seated;AAROM;10 reps Goniometric ROM: 12-76    General Comments        Pertinent Vitals/Pain Pain Score: 5  Pain Location: left knee Pain Descriptors / Indicators: Aching Pain Intervention(s): Limited activity within patient's tolerance;Monitored during session;Premedicated before session;Repositioned    Home Living                      Prior Function            PT Goals (current goals can now be found in the care plan section) Progress towards PT goals: Not progressing toward goals - comment (due to hypotension)    Frequency       PT Plan Current plan remains appropriate    Co-evaluation             End of Session Equipment Utilized During Treatment: Gait belt Activity Tolerance: Treatment limited secondary to medical complications (Comment) Patient left: in bed;with call bell/phone within reach;with family/visitor present     Time:  1610-9604 PT Time Calculation (min) (ACUTE ONLY): 34 min  Charges:  $Therapeutic Exercise: 8-22 mins $Therapeutic Activity: 8-22 mins                    G Codes:      Delorse Lek Nov 08, 2015, 8:28 AM Delaney Meigs, PT [phone]

## 2015-11-05 NOTE — Progress Notes (Signed)
Orthopedic Tech Progress Note Patient Details:  Alan AlbaJagdishcha Cipriani 1946/07/25 [id number]  Patient ID: Alan Roman, male   DOB: 1946/07/25, 70 y.o.   MRN: [id number] Patient refused cpm. Will call when ready. Alerted rn.   Alan Roman, Alan Roman 11/05/2015, 6:18 AM

## 2015-11-05 NOTE — Progress Notes (Signed)
MD paged regarding persistent pain and blood pressures that keep dropping especially on activity. Family requesting to see if MD can make changes to pain meds.

## 2015-11-05 NOTE — Discharge Instructions (Signed)
Information on my medicine - XARELTO® (Rivaroxaban) ° °This medication education was reviewed with me or my healthcare representative as part of my discharge preparation.  The pharmacist that spoke with me during my hospital stay was:  Montana Bryngelson P, RPH ° °Why was Xarelto® prescribed for you? °Xarelto® was prescribed for you to reduce the risk of blood clots forming after orthopedic surgery. The medical term for these abnormal blood clots is venous thromboembolism (VTE). ° °What do you need to know about xarelto® ? °Take your Xarelto® ONCE DAILY at the same time every day. °You may take it either with or without food. ° °If you have difficulty swallowing the tablet whole, you may crush it and mix in applesauce just prior to taking your dose. ° °Take Xarelto® exactly as prescribed by your doctor and DO NOT stop taking Xarelto® without talking to the doctor who prescribed the medication.  Stopping without other VTE prevention medication to take the place of Xarelto® may increase your risk of developing a clot. ° °After discharge, you should have regular check-up appointments with your healthcare provider that is prescribing your Xarelto®.   ° °What do you do if you miss a dose? °If you miss a dose, take it as soon as you remember on the same day then continue your regularly scheduled once daily regimen the next day. Do not take two doses of Xarelto® on the same day.  ° °Important Safety Information °A possible side effect of Xarelto® is bleeding. You should call your healthcare provider right away if you experience any of the following: °? Bleeding from an injury or your nose that does not stop. °? Unusual colored urine (red or dark brown) or unusual colored stools (red or black). °? Unusual bruising for unknown reasons. °? A serious fall or if you hit your head (even if there is no bleeding). ° °Some medicines may interact with Xarelto® and might increase your risk of bleeding while on Xarelto®. To help avoid this,  consult your healthcare provider or pharmacist prior to using any new prescription or non-prescription medications, including herbals, vitamins, non-steroidal anti-inflammatory drugs (NSAIDs) and supplements. ° °This website has more information on Xarelto®: www.xarelto.com. ° ° ° °

## 2015-11-05 NOTE — Discharge Summary (Signed)
Norlene CampbellPeter Whitfield, MD   Jacqualine CodeBrian Petrarca, PA-C [address], [place], [place]  [id number]                             [phone]  PATIENT ID: Alan Roman        MRN:  [id number]          DOB/AGE: 12/16/1945 / 70 y.o.    DISCHARGE SUMMARY  ADMISSION DATE:    11/03/2015 DISCHARGE DATE:   11/06/2015   ADMISSION DIAGNOSIS: Osteoarthritis Left Knee    DISCHARGE DIAGNOSIS:  Osteoarthritis Left Knee    ADDITIONAL DIAGNOSIS: Principal Problem:   Orthostatic hypotension Active Problems:   Extrapulmonary TB (tuberculosis)   Psoriatic arthritis (HCC)   Essential hypertension   Osteoarthritis of left knee   S/P total knee replacement using cement   Constipation   Thrombocytopenia (HCC)   Normocytic anemia  Past Medical History  Diagnosis Date  . Hypertension   . History of hiatal hernia 2007; 2009  . Arthritis     "knees" (11/03/2015)    PROCEDURE: Procedure(s): Left Total Knee Arthroplasty  on 11/03/2015  CONSULTS: none Treatment Team:  Eddie NorthNishant Dhungel, MD   HISTORY: *Alan Roman is a very pleasant 70 year old male who is seen today for evaluation of his left knee. He has had bilateral knee pain and has been treated by Dr. Corliss Skainseveshwar and Melina CopaHans Panwala, PA-C. He does have a history of psoriasis and psoriatic arthritis in the past noted. He has been noted to have end-stage OA in both knees. He has been treated with cortisone as well asEuflexxa bilaterally on multiple occasions. He recently had returned from a trip to [place] and had an MRI scan performed of both knees while visiting. The report noted a radial tear of the posterior root of the medial meniscus and degenerative arthritis involving the medial compartment with near-full-thickness cartilage loss. Also an MRI of the left knee was performed on 09/12/2015 demonstrating degenerative osteophytic changes involving the medial compartment of the knee joint with near-full-thickness cartilage loss and a moderate joint effusion  with mild synovial thickening. He has been treated with Henderson Baltimoretezla for his psoriatic arthritis with a very good response. He is basically very healthy and just has hypertension. He continues though to have rather significant pain and discomfort and difficulty with ambulation.** See H&P in chart  HOSPITAL COURSE:  Alan Roman is a 70 y.o. admitted on 11/03/2015 and found to have a diagnosis of Osteoarthritis Left Knee.  After appropriate laboratory studies were obtained  they were taken to the operating room on 11/03/2015 and underwent  Procedure(s): Left Total Knee Arthroplasty     They were given perioperative antibiotics:  Anti-infectives    Start     Dose/Rate Route Frequency Ordered Stop   11/03/15 1845  ceFAZolin (ANCEF) IVPB 2g/100 mL premix     2 g 200 mL/hr over 30 Minutes Intravenous Every 6 hours 11/03/15 1833 11/04/15 0207   11/03/15 0930  ceFAZolin (ANCEF) IVPB 2g/100 mL premix     2 g 200 mL/hr over 30 Minutes Intravenous To [facility]Stay [facility] 11/02/15 1259 11/03/15 1016    .  Tolerated the procedure well.  Toradol was given post op.  POD #1, allowed out of bed to a chair.  PT for ambulation and exercise program.    IV saline locked.  O2 discontionued. Had some congestion and CXR was ordered and showed bibasilar atelectasis. Hemovac pulled.  POD #2, continued PT  and ambulation.  Slow with ambulation.  Had some difficulty with orthostatic hypotension.  Fluid challenge given.  Consulted Hospitalist.  POD #3, continued PT.  Improved BP  The remainder of the hospital course was dedicated to ambulation and strengthening.   The patient was discharged on 3 Days Post-Op in  Stable condition.  Blood products given:none  DIAGNOSTIC STUDIES: Recent vital signs:  Patient Vitals for the past 24 hrs:  BP Temp Temp src Pulse Resp SpO2  11/06/15 0807 - 98 F (36.7 C) - - - -  11/06/15 0531 128/70 mmHg 98.7 F (37.1 C) Oral 97 19 98 %  11/05/15 2100 (!) 135/56 mmHg 98.6 F (37 C)  Oral 76 19 98 %  11/05/15 1751 110/66 mmHg 98.9 F (37.2 C) Oral (!) 107 - -  11/05/15 1413 (!) 78/61 mmHg - - 93 - 98 %  11/05/15 1213 95/66 mmHg 99.1 F (37.3 C) Oral 90 - -  11/05/15 0836 100/72 mmHg - - 91 - -       Recent laboratory studies:  Recent Labs  11/04/15 0457 11/05/15 0615 11/06/15 0545  WBC 7.5 7.8 7.0  HGB 11.7* 11.0* 9.3*  HCT 33.4* 31.3* 26.3*  PLT 134* 123* PENDING    Recent Labs  11/04/15 0457 11/05/15 0615 11/06/15 0545  NA 134* 136 135  K 4.0 3.5 3.3*  CL 99* 100* 104  CO2 24 26 22   BUN 15 10 8   CREATININE 1.13 1.01 0.91  GLUCOSE 108* 104* 139*  CALCIUM 7.8* 7.9* 7.1*   No results found for: INR, PROTIME   Recent Radiographic Studies :  Dg Chest 2 View  11/04/2015  CLINICAL DATA:  Cough and wheezing. EXAM: CHEST  2 VIEW COMPARISON:  10/28/2015. FINDINGS: Low lung volumes with mild basilar atelectasis. Cardiomegaly with normal pulmonary vascularity. No pleural effusion or pneumothorax. IMPRESSION: 1.  Low lung volumes with mild basilar atelectasis. 2. Cardiomegaly.  No evidence CHF.  Exam stable from 10/28/2015. Electronically Signed   By: Maisie Fus  Register   On: 11/04/2015 08:52   Dg Chest 2 View  10/28/2015  CLINICAL DATA:  Arthroplasty.  Preoperative study. EXAM: CHEST  2 VIEW COMPARISON:  09/05/2013. FINDINGS: Mediastinum hilar structures normal. Low lung volumes with mild basilar atelectasis. No focal infiltrate. No pleural effusion or pneumothorax. Mild cardiomegaly. Normal pulmonary vascularity . Degenerative changes thoracic spine . IMPRESSION: 1.  Low lung volumes.  No acute pulmonary disease. 2.  Mild cardiomegaly.  No pulmonary venous congestion . Electronically Signed   By: Maisie Fus  Register   On: 10/28/2015 10:11    DISCHARGE INSTRUCTIONS:     Discharge Instructions    CPM    Complete by:  As directed   Continuous passive motion machine (CPM):      Use the CPM from 0 to 60 degrees for 6-8 hours per day.      You may increase by 5-10  per day.  You may break it up into 2 or 3 sessions per day.      Use CPM for 3-4 weeks or until you are told to stop.     Call MD / Call 911    Complete by:  As directed   If you experience chest pain or shortness of breath, CALL 911 and be transported to the hospital emergency room.  If you develope a fever above 101 F, pus (white drainage) or increased drainage or redness at the wound, or calf pain, call your surgeon's office.  Change dressing    Complete by:  As directed   DO NOT CHANGE YOUR DRESSING.     Constipation Prevention    Complete by:  As directed   Drink plenty of fluids.  Prune juice may be helpful.  You may use a stool softener, such as Colace (over the counter) 100 mg twice a day.  Use MiraLax (over the counter) for constipation as needed.     Diet general    Complete by:  As directed      Discharge instructions    Complete by:  As directed   INSTRUCTIONS AFTER JOINT REPLACEMENT   Remove items at home which could result in a fall. This includes throw rugs or furniture in walking pathways ICE to the affected joint every three hours while awake for 30 minutes at a time, for at least the first 3-5 days, and then as needed for pain and swelling.  Continue to use ice for pain and swelling. You may notice swelling that will progress down to the foot and ankle.  This is normal after surgery.  Elevate your leg when you are not up walking on it.   Continue to use the breathing machine you got in the hospital (incentive spirometer) which will help keep your temperature down.  It is common for your temperature to cycle up and down following surgery, especially at night when you are not up moving around and exerting yourself.  The breathing machine keeps your lungs expanded and your temperature down.   DIET:  As you were doing prior to hospitalization, we recommend a well-balanced diet.  DRESSING / WOUND CARE / SHOWERING  Keep the surgical dressing until follow up.  The dressing is  water proof, so you can shower without any extra covering.  IF THE DRESSING FALLS OFF or the wound gets wet inside, change the dressing with sterile gauze.  Please use good hand washing techniques before changing the dressing.  Do not use any lotions or creams on the incision until instructed by your surgeon.    ACTIVITY  Increase activity slowly as tolerated, but follow the weight bearing instructions below.   No driving for 6 weeks or until further direction given by your physician.  You cannot drive while taking narcotics.  No lifting or carrying greater than 10 lbs. until further directed by your surgeon. Avoid periods of inactivity such as sitting longer than an hour when not asleep. This helps prevent blood clots.  You may return to work once you are authorized by your doctor.     WEIGHT BEARING   Partial weight bearing with assist device as directed.  50% weight bearing as taught in PT   EXERCISES  Results after joint replacement surgery are often greatly improved when you follow the exercise, range of motion and muscle strengthening exercises prescribed by your doctor. Safety measures are also important to protect the joint from further injury. Any time any of these exercises cause you to have increased pain or swelling, decrease what you are doing until you are comfortable again and then slowly increase them. If you have problems or questions, call your caregiver or physical therapist for advice.   Rehabilitation is important following a joint replacement. After just a few days of immobilization, the muscles of the leg can become weakened and shrink (atrophy).  These exercises are designed to build up the tone and strength of the thigh and leg muscles and to improve motion. Often times heat used for twenty to  thirty minutes before working out will loosen up your tissues and help with improving the range of motion but do not use heat for the first two weeks following surgery (sometimes  heat can increase post-operative swelling).   These exercises can be done on a training (exercise) mat,  on a table or on a bed. Use whatever works the best and is most comfortable for you.    Use music or television while you are exercising so that the exercises are a pleasant break in your day. This will make your life better with the exercises acting as a break in your routine that you can look forward to.   Perform all exercises about fifteen times, three times per day or as directed.  You should exercise both the operative leg and the other leg as well.   Exercises include:  Quad Sets - Tighten up the muscle on the front of the thigh (Quad) and hold for 5-10 seconds.   Straight Leg Raises - With your knee straight (if you were given a brace, keep it on), lift the leg to 60 degrees, hold for 3 seconds, and slowly lower the leg.  Perform this exercise against resistance later as your leg gets stronger.  Leg Slides: Lying on your back, slowly slide your foot toward your buttocks, bending your knee up off the floor (only go as far as is comfortable). Then slowly slide your foot back down until your leg is flat on the floor again.  Angel Wings: Lying on your back spread your legs to the side as far apart as you can without causing discomfort.  Hamstring Strength:  Lying on your back, push your heel against the floor with your leg straight by tightening up the muscles of your buttocks.  Repeat, but this time bend your knee to a comfortable angle, and push your heel against the floor.  You may put a pillow under the heel to make it more comfortable if necessary.   A rehabilitation program following joint replacement surgery can speed recovery and prevent re-injury in the future due to weakened muscles. Contact your doctor or a physical therapist for more information on knee rehabilitation.    CONSTIPATION  Constipation is defined medically as fewer than three stools per week and severe constipation as  less than one stool per week.  Even if you have a regular bowel pattern at home, your normal regimen is likely to be disrupted due to multiple reasons following surgery.  Combination of anesthesia, postoperative narcotics, change in appetite and fluid intake all can affect your bowels.   YOU MUST use at least one of the following options; they are listed in order of increasing strength to get the job done.  They are all available over the counter, and you may need to use some, POSSIBLY even all of these options:    Drink plenty of fluids (prune juice may be helpful) and high fiber foods Colace 100 mg by mouth twice a day  Senokot for constipation as directed and as needed Dulcolax (bisacodyl), take with full glass of water  Miralax (polyethylene glycol) once or twice a day as needed.  If you have tried all these things and are unable to have a bowel movement in the first 3-4 days after surgery call either your surgeon or your primary doctor.    If you experience loose stools or diarrhea, hold the medications until you stool forms back up.  If your symptoms do not get better within 1  week or if they get worse, check with your doctor.  If you experience "the worst abdominal pain ever" or develop nausea or vomiting, please contact the office immediately for further recommendations for treatment.   ITCHING:  If you experience itching with your medications, try taking only a single pain pill, or even half a pain pill at a time.  You can also use Benadryl over the counter for itching or also to help with sleep.   TED HOSE STOCKINGS:  Use stockings on both legs until for at least 2 weeks or as directed by physician office. They may be removed at night for sleeping.  MEDICATIONS:  See your medication summary on the "After Visit Summary" that nursing will review with you.  You may have some home medications which will be placed on hold until you complete the course of blood thinner medication.  It is  important for you to complete the blood thinner medication as prescribed.  PRECAUTIONS:  If you experience chest pain or shortness of breath - call 911 immediately for transfer to the hospital emergency department.   If you develop a fever greater that 101 F, purulent drainage from wound, increased redness or drainage from wound, foul odor from the wound/dressing, or calf pain - CONTACT YOUR SURGEON.                                                   FOLLOW-UP APPOINTMENTS:  If you do not already have a post-op appointment, please call the office for an appointment to be seen by your surgeon.  Guidelines for how soon to be seen are listed in your "After Visit Summary", but are typically between 1-4 weeks after surgery.  OTHER INSTRUCTIONS:   Knee Replacement:  Do not place pillow under knee, focus on keeping the knee straight while resting. CPM instructions: 0-90 degrees, 2 hours in the morning, 2 hours in the afternoon, and 2 hours in the evening. Place foam block, curve side up under heel at all times except when in CPM or when walking.  DO NOT modify, tear, cut, or change the foam block in any way.  MAKE SURE YOU:  Understand these instructions.  Get help right away if you are not doing well or get worse.    Thank you for letting us be a part of your medical care team.  It is a privilege we respect greatly.  We hope these instructions will help you stay on track for a fast and full recovery!     Do not put a pillow under the knee. Place it under the heel.    Complete by:  As directed      Driving restrictions    Complete by:  As directed   No driving for 6 weeks     Increase activity slowly as tolerated    Complete by:  As directed      Lifting restrictions    Complete by:  As directed   No lifting for 6 weeks     Partial weight bearing    Complete by:  As directed   % Body Weight:  50%  Laterality:  left  Extremity:  Lower     Patient may shower    Complete by:  As directed    You may shower over your dressing     TED  hose    Complete by:  As directed   Use stockings (TED hose) for 3 weeks on left  leg.  You may remove them at night for sleeping.           DISCHARGE MEDICATIONS:     Medication List    STOP taking these medications        ethambutol 400 MG tablet  Commonly known as:  MYAMBUTOL     feeding supplement Liqd     isoniazid 300 MG tablet  Commonly known as:  NYDRAZID     OTEZLA 30 MG Tabs  Generic drug:  Apremilast     pyrazinamide 500 MG tablet     pyridOXINE 50 MG tablet  Commonly known as:  B-6     rifampin 300 MG capsule  Commonly known as:  RIFADIN      TAKE these medications        amLODipine 5 MG tablet  Commonly known as:  NORVASC  Take 5 mg by mouth daily.     chlorpheniramine-HYDROcodone 10-8 MG/5ML Lqcr  Commonly known as:  TUSSIONEX PENNKINETIC ER  Take 5 mLs by mouth every 12 (twelve) hours. Take for 4 days then use as needed for cough.     losartan 25 MG tablet  Commonly known as:  COZAAR  Take 25 mg by mouth daily.     methocarbamol 500 MG tablet  Commonly known as:  ROBAXIN  Take 1 tablet (500 mg total) by mouth every 8 (eight) hours as needed for muscle spasms.     oxyCODONE 5 MG immediate release tablet  Commonly known as:  Oxy IR/ROXICODONE  Take 1-2 tablets (5-10 mg total) by mouth every 4 (four) hours as needed for breakthrough pain.     rivaroxaban 10 MG Tabs tablet  Commonly known as:  XARELTO  Take 1 tablet (10 mg total) by mouth daily with breakfast.        FOLLOW UP VISIT:   Follow-up Information    Follow up with Valeria Batman, MD On 11/16/2015.   Specialty:  Orthopedic Surgery   Contact information:   [address] [place] [address] [phone]       DISPOSITION:   Home  CONDITION:  Stable   Oris Drone. Aleda Grana [facility] [phone]  11/06/2015 8:20 AM

## 2015-11-05 NOTE — Progress Notes (Signed)
Patient ID: Alan Roman, male   DOB: February 20, 1946, 70 y.o.   MRN: [id number] PATIENT ID: Alan Roman        MRN:  [id number]          DOB/AGE: 1945-08-08 / 70 y.o.    Norlene Campbell, MD   Jacqualine Code, PA-C [address] [place], [place]  [address]                             [phone]   PROGRESS NOTE  Subjective:  negative for Chest Pain  negative for Shortness of Breath  negative for Nausea/Vomiting   negative for Calf Pain    Tolerating Diet: yes         Patient reports pain as moderate.     Comfortable night-pain controlled  Objective: Vital signs in last 24 hours:   Patient Vitals for the past 24 hrs:  BP Temp Temp src Pulse Resp SpO2  11/05/15 0836 100/72 mmHg - - 91 - -  11/05/15 0819 - - - - - 96 %  11/05/15 0808 101/68 mmHg - - - - -  11/05/15 0509 (!) 109/58 mmHg 99.1 F (37.3 C) - 90 16 98 %  11/04/15 2107 (!) 105/58 mmHg 100 F (37.8 C) Oral (!) 105 18 98 %  11/04/15 1545 (!) 86/62 mmHg - - - - -  11/04/15 1356 102/72 mmHg 98.1 F (36.7 C) Oral 86 18 100 %      Intake/Output from previous day:   04/05 0701 - 04/06 0700 In: 550 [P.O.:360] Out: 4300 [Urine:4200; Drains:100]   Intake/Output this shift:       Intake/Output      04/05 0701 - 04/06 0700 04/06 0701 - 04/07 0700   P.O. 360    I.V. (mL/kg)     Other 190    Total Intake(mL/kg) 550 (7.9)    Urine (mL/kg/hr) 4200 (2.5)    Drains 100 (0.1)    Blood     Total Output 4300     Net -3750             LABORATORY DATA:  Recent Labs  11/04/15 0457 11/05/15 0615  WBC 7.5 7.8  HGB 11.7* 11.0*  HCT 33.4* 31.3*  PLT 134* 123*    Recent Labs  11/04/15 0457 11/05/15 0615  NA 134* 136  K 4.0 3.5  CL 99* 100*  CO2 24 26  BUN 15 10  CREATININE 1.13 1.01  GLUCOSE 108* 104*  CALCIUM 7.8* 7.9*   No results found for: INR, PROTIME  Recent Radiographic Studies :  Dg Chest 2 View  11/04/2015  CLINICAL DATA:  Cough and wheezing. EXAM: CHEST  2 VIEW COMPARISON:  10/28/2015.  FINDINGS: Low lung volumes with mild basilar atelectasis. Cardiomegaly with normal pulmonary vascularity. No pleural effusion or pneumothorax. IMPRESSION: 1.  Low lung volumes with mild basilar atelectasis. 2. Cardiomegaly.  No evidence CHF.  Exam stable from 10/28/2015. Electronically Signed   By: Maisie Fus  Register   On: 11/04/2015 08:52   Dg Chest 2 View  10/28/2015  CLINICAL DATA:  Arthroplasty.  Preoperative study. EXAM: CHEST  2 VIEW COMPARISON:  09/05/2013. FINDINGS: Mediastinum hilar structures normal. Low lung volumes with mild basilar atelectasis. No focal infiltrate. No pleural effusion or pneumothorax. Mild cardiomegaly. Normal pulmonary vascularity . Degenerative changes thoracic spine . IMPRESSION: 1.  Low lung volumes.  No acute pulmonary disease. 2.  Mild cardiomegaly.  No pulmonary venous congestion .  Electronically Signed   By: Maisie Fushomas  Register   On: 10/28/2015 10:11     Examination:  General appearance: alert, cooperative and no distress  Wound Exam: clean, dry, intact   Drainage:  Scant/small amount Serosanguinous exudate  Motor Exam: EHL, FHL, Anterior Tibial and Posterior Tibial Intact  Sensory Exam: Superficial Peroneal, Deep Peroneal and Tibial normal  Vascular Exam: Normal  Assessment:    2 Days Post-Op  Procedure(s) (LRB): Left Total Knee Arthroplasty (Left)  ADDITIONAL DIAGNOSIS:  Principal Problem:   Osteoarthritis of left knee Active Problems:   Psoriatic arthritis (HCC)   Essential hypertension   S/P total knee replacement using cement  Acute Blood Loss Anemia-stable, asymptomatic orthostatic hypotension-could be related to analgesics, blood pressure meds held, improving-discussed with nurse  Plan: Physical Therapy as ordered Partial Weight Bearing @ 50% (PWB)  DVT Prophylaxis:  Xarelto, Foot Pumps and TED hose  DISCHARGE PLAN: Home  DISCHARGE NEEDS: HHPT, CPM, Walker and 3-in-1 comode seat CXR stable from preop-no rales or rhonchi this am,  dressing changed-wounds clean and dry, no calf pain, voiding without difficulty-needs another day of PT, lab stable Plan for D/C in am    Norlene CampbellWHITFIELD, Margaret Cockerill W  11/05/2015 9:29 AM

## 2015-11-05 NOTE — Progress Notes (Signed)
Occupational Therapy Treatment Patient Details Name: Liston AlbaJagdishcha Dumire MRN: [id number] DOB: 12-08-1945 Today's Date: 11/05/2015    History of present illness 70 yo male S/P LEFT total knee replacement using cement. PMH: arthritis, HTN    OT comments  Patient's goal progression is limited due to orthostatic hypotension.  BP readings: Supine: 113/71 Seated EOB: 102/65 Standing: 81/57 Seated in recliner after minimal activity: 82/59  See below under ADL comment for what therapist worked with patient on during this OT session. Would like to see patient a second time today as schedule allows. At this time, do not believe pt is safe or ready to go home tomorrow morning.    Follow Up Recommendations  No OT follow up;Supervision/Assistance - 24 hour (if pt doesn't progress during acute care stay, he may need HHOT)    Equipment Recommendations  3 in 1 bedside comode;Tub/shower bench (son shoulde be getting tub transfer bench)    Recommendations for Other Services  None at this time   Precautions / Restrictions Precautions Precautions: Knee;Fall Precaution Comments: watch BP Restrictions Weight Bearing Restrictions: Yes LLE Weight Bearing: Partial weight bearing LLE Partial Weight Bearing Percentage or Pounds: 50    Mobility Bed Mobility Overal bed mobility: Needs Assistance Bed Mobility: Supine to Sit     Supine to sit: Min assist     General bed mobility comments: Assistance for management of LLE. Cues for safety and technique.   Transfers Overall transfer level: Needs assistance Equipment used: Rolling walker (2 wheeled) Transfers: Sit to/from Stand Sit to Stand: Min assist General transfer comment: Cues for hand placement and safety. Min assist for safety during transfers. Pt limited by orthostatic hypotension     Balance Overall balance assessment: Needs assistance Sitting-balance support: No upper extremity supported;Feet supported Sitting balance-Leahy Scale: Good      Standing balance support: Bilateral upper extremity supported;During functional activity Standing balance-Leahy Scale: Fair Standing balance comment: reliant on RW   ADL Overall ADL's : Needs assistance/impaired Eating/Feeding: Set up;Sitting   Grooming: Set up;Sitting   Upper Body Bathing: Minimal assitance;Sitting   Lower Body Bathing: Minimal assistance;Sit to/from stand   Upper Body Dressing : Minimal assistance;Sitting   Lower Body Dressing: Minimal assistance;Sit to/from stand   Toilet Transfer: Minimal Cabin crewassistance;Stand-pivot Toilet Transfer Details (indicate cue type and reason): simulated EOB to recliner transfer    Toileting - Clothing Manipulation Details (indicate cue type and reason): did not occur   Tub/Shower Transfer Details (indicate cue type and reason): did not occur, safety concern at this time Functional mobility during ADLs: Minimal assistance;Moderate assistance;Cueing for safety;Cueing for sequencing;Rolling walker General ADL Comments: Pt found supine in bed after wife stated he was sponge bathed by NT. Pt asleep upon entering room, but easy to awake and arouse. Pt willing to work with therapist, but with complaints of dizziness, chills, and increased pain. Pt engaged in bed mobility with min assist for management of LLE. Pt sat EOB with moderate complaints of dizziness, BP=102/65. Pt stood and BP=81/57, pt with moderate complaints of dizziness. Pt stated he felt up to transferring to recliner, pt took a few steps and transferred onto recliner. Once seated, BP=82/59. Pt stated he was very cold. Therapist donned two blankets and notified RN of patient's requested for pain medication and request to have temperature taken. Pt's physical appearance today worse that yesterday, pt looked fatigued and like he does not feel well.            Cognition   Behavior During Therapy: [facility]  for tasks assessed/performed Overall Cognitive Status: Within Functional Limits for  tasks assessed                 Pertinent Vitals/ Pain       Pain Assessment: 0-10 Pain Score: 8  Pain Location: LLE/knee Pain Descriptors / Indicators: Aching;Sore;Grimacing;Guarding Pain Intervention(s): Limited activity within patient's tolerance;Monitored during session;Repositioned;Ice applied   Frequency Min 2X/week     Progress Toward Goals  OT Goals(current goals can now befound in the care plan section)  Progress towards OT goals: Not progressing toward goals - comment (pt limited by orthostatic hypotension)  Acute Rehab OT Goals Patient Stated Goal: get stronger  OT Goal Formulation: With patient/family Time For Goal Achievement: 11/18/15 Potential to Achieve Goals: Good  Plan Discharge plan remains appropriate    End of Session Equipment Utilized During Treatment: Gait belt;Rolling walker CPM Left Knee CPM Left Knee: Off   Activity Tolerance Other (comment) (limited by orthostatic hypotension)   Patient Left in chair;with call bell/phone within reach;with family/visitor present;Other (comment);with nursing/sitter in room (with PT in room as well)   Nurse Communication Mobility status;Patient requests pain meds;Other (comment) (patient's low BP readings and pt's complaints of being cold)      Time: 1136-1209 OT Time Calculation (min): 33 min  Charges: OT General Charges $OT Visit: 1 Procedure OT Treatments $Self Care/Home Management : 8-22 mins $Therapeutic Activity: 8-22 mins  Edwin Cap , MS, OTR/L, [place] Pager: [phone]  11/05/2015, 12:44 PM

## 2015-11-05 NOTE — Progress Notes (Signed)
Alan Cancellation Note  Patient Details Name: Alan Roman MRN: [id number] DOB: Jun 06, 1946   Cancelled Treatment:    Reason Eval/Treat Not Completed: Pain limiting ability to participate (pain 7-8/10 and receiving muscle relaxer. Will attempt later as able)   Alan Roman, Alan Roman, Alan Roman, Alan Roman

## 2015-11-05 NOTE — Progress Notes (Signed)
Physical Therapy Treatment Patient Details Name: Alan Roman MRN: [id number] DOB: 1945/08/29 Today's Date: 11/05/2015    History of Present Illness 70 yo male S/P LEFT total knee replacement using cement. PMH: arthritis, HTN     PT Comments    Pt on arrival reports pain 4/10 surrounding TKA. Pt given muscle relaxer but no pain medicine since this AM due to hypotension. Pt this session with continued drop in BP but not as significantly as AM session. Pt unable to tolerate increased ROM, HEP or gait due to pain. Wife present throughout and RN notified of all above and providing pain meds end of session. Encouraged continued mobility, CPM and HEP this afternoon. Pt stated he just couldn't tolerate further mobility or CPM due to pain. Will continue to follow.   BP in sitting 125/93 In standing 78/61 After 3 min standing 86/60 HR 93 sats 98% on RA  Follow Up Recommendations  Home health PT;Supervision - Intermittent     Equipment Recommendations  Rolling walker with 5" wheels;3in1 (PT)    Recommendations for Other Services       Precautions / Restrictions Precautions Precautions: Knee;Fall Precaution Comments: watch BP Restrictions Weight Bearing Restrictions: Yes LLE Weight Bearing: Partial weight bearing LLE Partial Weight Bearing Percentage or Pounds: 50    Mobility  Bed Mobility Overal bed mobility: Needs Assistance Bed Mobility: Supine to Sit     Supine to sit: Min assist     General bed mobility comments: pt in chair on arrival  Transfers Overall transfer level: Needs assistance Equipment used: Rolling walker (2 wheeled) Transfers: Sit to/from Stand Sit to Stand: Min guard         General transfer comment: cues for hand placement and safety.   Ambulation/Gait Ambulation/Gait assistance: Min guard Ambulation Distance (Feet): 3 Feet Assistive device: Rolling walker (2 wheeled) Gait Pattern/deviations: Step-to pattern     General Gait Details: pt  able to take 3 short steps with cues for sequence and PWB status but unable to continue due to pain up to 9/10 with standing and returned to chair   Stairs            Wheelchair Mobility    Modified Rankin (Stroke Patients Only)       Balance Overall balance assessment: Needs assistance Sitting-balance support: No upper extremity supported;Feet supported Sitting balance-Leahy Scale: Good     Standing balance support: Bilateral upper extremity supported;During functional activity Standing balance-Leahy Scale: Fair Standing balance comment: reliant on RW                    Cognition Arousal/Alertness: Awake/alert Behavior During Therapy: WFL for tasks assessed/performed Overall Cognitive Status: Within Functional Limits for tasks assessed                      Exercises Total Joint Exercises Quad Sets: AROM;Right;10 reps;Seated Short Arc Quad: AAROM;Left;10 reps;Seated Heel Slides: AAROM;Left;15 reps;Seated Hip ABduction/ADduction: AROM;Left;15 reps;Seated    General Comments        Pertinent Vitals/Pain Pain Assessment: 0-10 Pain Score: 4  Pain Location: distal and proximal to TKA left 4/10 beginning, 5/ 10 with HEP and 9/10 after standing Pain Descriptors / Indicators: Aching;Cramping Pain Intervention(s): Monitored during session;Limited activity within patient's tolerance;Repositioned;Ice applied;Premedicated before session;Patient requesting pain meds-RN notified (pt given robaxin before as MD wanting him to avoid oxy if possible)    Home Living  Prior Function            PT Goals (current goals can now be found in the care plan section) Acute Rehab PT Goals Patient Stated Goal: get stronger  Progress towards PT goals: Not progressing toward goals - comment (due to pain and hypotension)    Frequency       PT Plan Current plan remains appropriate    Co-evaluation             End of Session  Equipment Utilized During Treatment: Gait belt Activity Tolerance: Patient limited by pain Patient left: in chair;with call bell/phone within reach;with nursing/sitter in room;with family/visitor present     Time: [id number] PT Time Calculation (min) (ACUTE ONLY): 32 min  Charges:  $Therapeutic Exercise: 8-22 mins $Therapeutic Activity: 8-22 mins                    G Codes:      [name], Rosabella Edgin Beth 11/05/2015, 2:16 PM Delaney MeigsMaija Tabor Danial Hlavac, PT [phone]

## 2015-11-05 NOTE — Progress Notes (Signed)
Orthostatic blood pressures still showing some drop when pt is standing up or sitting down. IV fluids infusing as ordered

## 2015-11-06 DIAGNOSIS — I951 Orthostatic hypotension: Secondary | ICD-10-CM

## 2015-11-06 DIAGNOSIS — E876 Hypokalemia: Secondary | ICD-10-CM

## 2015-11-06 LAB — CBC
HCT: 26.3 % — ABNORMAL LOW (ref 39.0–52.0)
Hemoglobin: 9.3 g/dL — ABNORMAL LOW (ref 13.0–17.0)
MCH: 32.9 pg (ref 26.0–34.0)
MCHC: 35.4 g/dL (ref 30.0–36.0)
MCV: 92.9 fL (ref 78.0–100.0)
Platelets: 113 10*3/uL — ABNORMAL LOW (ref 150–400)
RBC: 2.83 MIL/uL — ABNORMAL LOW (ref 4.22–5.81)
RDW: 12.4 % (ref 11.5–15.5)
WBC: 7 10*3/uL (ref 4.0–10.5)

## 2015-11-06 LAB — BASIC METABOLIC PANEL
Anion gap: 9 (ref 5–15)
BUN: 8 mg/dL (ref 6–20)
CO2: 22 mmol/L (ref 22–32)
Calcium: 7.1 mg/dL — ABNORMAL LOW (ref 8.9–10.3)
Chloride: 104 mmol/L (ref 101–111)
Creatinine, Ser: 0.91 mg/dL (ref 0.61–1.24)
GFR calc Af Amer: >60 mL/min (ref >60)
GFR calc non Af Amer: >60 mL/min (ref >60)
Glucose, Bld: 139 mg/dL — ABNORMAL HIGH (ref 65–99)
Potassium: 3.3 mmol/L — ABNORMAL LOW (ref 3.5–5.1)
Sodium: 135 mmol/L (ref 135–145)

## 2015-11-06 LAB — URINE CULTURE

## 2015-11-06 MED ORDER — POTASSIUM CHLORIDE CRYS ER 20 MEQ PO TBCR
40.0000 meq | EXTENDED_RELEASE_TABLET | Freq: Once | ORAL | Status: AC
  Administered 2015-11-06: 40 meq via ORAL
  Filled 2015-11-06: qty 2

## 2015-11-06 MED ORDER — SODIUM CHLORIDE 0.9 % IV BOLUS (SEPSIS)
1000.0000 mL | Freq: Once | INTRAVENOUS | Status: AC
  Administered 2015-11-06: 1000 mL via INTRAVENOUS

## 2015-11-06 MED ORDER — FLEET ENEMA 7-19 GM/118ML RE ENEM
1.0000 | ENEMA | Freq: Once | RECTAL | Status: AC
  Administered 2015-11-06: 1 via RECTAL
  Filled 2015-11-06: qty 1

## 2015-11-06 NOTE — Progress Notes (Signed)
Patient stated to RN that he is still feeling dizzy and not "himself"- stated that he might benefit from one more day. Will notify MD.

## 2015-11-06 NOTE — Progress Notes (Signed)
Occupational Therapy Treatment Patient Details Name: Adriana Lina MRN: [id number] DOB: [birth year] Today's Date: 11/06/2015    History of present illness 70 yo male S/P LEFT total knee replacement using cement with orthostatic hypotension post op. PMH: arthritis, HTN    OT comments  Pt. Was taken to 5th floor Ortho gym with family present to practice tub transfer with bench. Pt. And dtrs. Were ed on proper technique for transfer with use of bench. Pt. Required Mod A with transfer to bring LE into/out of tub. Pt. Required min v.c.ing for proper hand and foot placement. Pt. Was Min a with transfer to [facility] with cues for proper hand and foot placement. Pt. Was taken back to 6th floor and returned to room. Pt. Wanted to transfer to bed. Pt. Was Min A with sit to stand from recliner chair and with stand pivot to bed. Pt. Was Min/Mod A with sit to supine to bring legs into bed.  Follow Up Recommendations  Supervision/Assistance - 24 hour;Home health OT    Equipment Recommendations  3 in 1 bedside comode;Tub/shower bench    Recommendations for Other Services      Precautions / Restrictions Precautions Precautions: Knee;Fall Restrictions Weight Bearing Restrictions: Yes LLE Weight Bearing: Partial weight bearing LLE Partial Weight Bearing Percentage or Pounds: 50       Mobility Bed Mobility           Sit to supine: Min assist   General bed mobility comments: in chair on arrival  Transfers Overall transfer level: Needs assistance   Transfers: Stand Pivot Transfers Sit to Stand: Min guard         General transfer comment: cues for hand placement, LLE position and safety with increased time to rise to standing    Balance                                   ADL                           Toilet Transfer: Minimal assistance     Toileting - Clothing Manipulation Details (indicate cue type and reason):  (Min A)   Tub/Shower Transfer Details  (indicate cue type and reason):  (Pt. was Min/ ModA with transfer into/out of bathtub w bench.) Functional mobility during ADLs: Min guard;Minimal assistance General ADL Comments: Pt. and dtr ed on transfer for toilet and tub with DME.       Vision                     Perception     Praxis      Cognition   Behavior During Therapy: [facility] for tasks assessed/performed Overall Cognitive Status: Within Functional Limits for tasks assessed                       Extremity/Trunk Assessment               Exercises Total Joint Exercises Short Arc Quad: AAROM;Left;Seated;15 reps Heel Slides: AAROM;Left;Seated;15 reps Hip ABduction/ADduction: AAROM;Left;15 reps;Seated   Shoulder Instructions       General Comments      Pertinent Vitals/ Pain       Pain Assessment: 0-10 Pain Score: 4  Pain Location: back of R thigh Pain Descriptors / Indicators: Aching Pain Intervention(s): Limited activity within patient's tolerance  Home Living  Prior Functioning/Environment              Frequency Min 2X/week     Progress Toward Goals  OT Goals(current goals can now be found in the care plan section)  Progress towards OT goals: Progressing toward goals  Acute Rehab OT Goals Patient Stated Goal:  (To go home)  Plan Discharge plan remains appropriate    Co-evaluation                 End of Session Equipment Utilized During Treatment: Gait belt;Rolling walker   Activity Tolerance Patient tolerated treatment well   Patient Left in bed;with call bell/phone within reach;with family/visitor present   Nurse Communication          Time: 1311-1350 OT Time Calculation (min): 39 min  Charges: OT General Charges $OT Visit: 1 Procedure OT Treatments $Self Care/Home Management : 23-37 mins $Therapeutic Activity: 8-22 mins  Ileanna Gemmill 11/06/2015, 2:26 PM

## 2015-11-06 NOTE — Progress Notes (Signed)
Patient ID: Alan AlbaJagdishcha Roman, male   DOB: [birth year], 70 y.o.   MRN: [id number] PATIENT ID: Alan Roman        MRN:  [id number]          DOB/AGE: [birth year] / 70 y.o.    Norlene CampbellPeter Bo Teicher, MD   Jacqualine CodeBrian Petrarca, PA-C [address] [place], [place]  [id number]                             [phone]   PROGRESS NOTE  Subjective:  negative for Chest Pain  negative for Shortness of Breath  negative for Nausea/Vomiting   negative for Calf Pain    Tolerating Diet: yes         Patient reports pain as moderate.     Having some left thigh pain most likely related to tourniquet-no edema and localized to area where tourniquet was applied  Objective: Vital signs in last 24 hours:   Patient Vitals for the past 24 hrs:  BP Temp Temp src Pulse Resp SpO2  11/06/15 0531 128/70 mmHg 98.7 F (37.1 C) Oral 97 19 98 %  11/05/15 2100 (!) 135/56 mmHg 98.6 F (37 C) Oral 76 19 98 %  11/05/15 1751 110/66 mmHg 98.9 F (37.2 C) Oral (!) 107 - -  11/05/15 1413 (!) 78/61 mmHg - - 93 - 98 %  11/05/15 1213 95/66 mmHg 99.1 F (37.3 C) Oral 90 - -  11/05/15 0836 100/72 mmHg - - 91 - -  11/05/15 0819 - - - - - 96 %  11/05/15 0808 101/68 mmHg - - - - -      Intake/Output from previous day:   04/06 0701 - 04/07 0700 In: 480 [P.O.:480] Out: 2450 [Urine:2450]   Intake/Output this shift:       Intake/Output      04/06 0701 - 04/07 0700 04/07 0701 - 04/08 0700   P.O. 480    Other     Total Intake(mL/kg) 480 (6.9)    Urine (mL/kg/hr) 2450 (1.5)    Drains     Total Output 2450     Net -1970             LABORATORY DATA:  Recent Labs  11/04/15 0457 11/05/15 0615 11/06/15 0545  WBC 7.5 7.8 7.0  HGB 11.7* 11.0* 9.3*  HCT 33.4* 31.3* 26.3*  PLT 134* 123* PENDING    Recent Labs  11/04/15 0457 11/05/15 0615 11/06/15 0545  NA 134* 136 135  K 4.0 3.5 3.3*  CL 99* 100* 104  CO2 24 26 22   BUN 15 10 8   CREATININE 1.13 1.01 0.91  GLUCOSE 108* 104* 139*  CALCIUM 7.8* 7.9* 7.1*   No  results found for: INR, PROTIME  Recent Radiographic Studies :  Dg Chest 2 View  11/04/2015  CLINICAL DATA:  Cough and wheezing. EXAM: CHEST  2 VIEW COMPARISON:  10/28/2015. FINDINGS: Low lung volumes with mild basilar atelectasis. Cardiomegaly with normal pulmonary vascularity. No pleural effusion or pneumothorax. IMPRESSION: 1.  Low lung volumes with mild basilar atelectasis. 2. Cardiomegaly.  No evidence CHF.  Exam stable from 10/28/2015. Electronically Signed   By: Maisie Fushomas  Register   On: 11/04/2015 08:52   Dg Chest 2 View  10/28/2015  CLINICAL DATA:  Arthroplasty.  Preoperative study. EXAM: CHEST  2 VIEW COMPARISON:  09/05/2013. FINDINGS: Mediastinum hilar structures normal. Low lung volumes with mild basilar atelectasis. No focal infiltrate. No pleural effusion or pneumothorax.  Mild cardiomegaly. Normal pulmonary vascularity . Degenerative changes thoracic spine . IMPRESSION: 1.  Low lung volumes.  No acute pulmonary disease. 2.  Mild cardiomegaly.  No pulmonary venous congestion . Electronically Signed   By: Maisie Fus  Register   On: 10/28/2015 10:11     Examination:  General appearance: alert  Wound Exam: clean, dry, intact   Drainage:  None: wound tissue dry  Motor Exam: EHL, FHL, Anterior Tibial and Posterior Tibial Intact  Sensory Exam: Superficial Peroneal, Deep Peroneal and Tibial normal  Vascular Exam: Normal  Assessment:    3 Days Post-Op  Procedure(s) (LRB): Left Total Knee Arthroplasty (Left)  ADDITIONAL DIAGNOSIS:  Principal Problem:   Orthostatic hypotension Active Problems:   Extrapulmonary TB (tuberculosis)   Psoriatic arthritis (HCC)   Essential hypertension   Osteoarthritis of left knee   S/P total knee replacement using cement   Constipation   Thrombocytopenia (HCC)   Normocytic anemia  Acute Blood Loss Anemia-asymptomatic Hypotension-resolved  Plan: Physical Therapy as ordered Partial Weight Bearing @ 50% (PWB)  DVT Prophylaxis:  Xarelto, Foot Pumps  and TED hose  DISCHARGE PLAN: Home  DISCHARGE NEEDS: HHPT, CPM, Walker and 3-in-1 comode seat   OK for discharge-will need PT this am for further instructions, no BM-will have nursing staff give enema     Valeria Batman  11/06/2015 7:35 AM

## 2015-11-06 NOTE — Progress Notes (Signed)
Physical Therapy Treatment Patient Details Name: Alan Roman MRN: [id number] DOB: Apr 29, 1946 Today's Date: 11/06/2015    History of Present Illness 70 yo male S/P LEFT total knee replacement using cement with orthostatic hypotension post op. PMH: arthritis, HTN     PT Comments    Pt with improved BP today, activity tolerance and gait. Pt continues to have difficulty with quad strength and ROM but achieving more extension today with heel elevated on foam end of session. Pt and dgtr educated for HEP, function and gait. Encouraged CPM use and will continue to follow.  BP in sitting 110/70 Standing 93/63 3 min in standing 102/67, HR 111 After gait 103/69, HR 104  Follow Up Recommendations  Home health PT;Supervision - Intermittent     Equipment Recommendations  Rolling walker with 5" wheels;3in1 (PT)    Recommendations for Other Services       Precautions / Restrictions Precautions Precautions: Knee;Fall Precaution Comments: watch BP Restrictions LLE Weight Bearing: Partial weight bearing LLE Partial Weight Bearing Percentage or Pounds: 50    Mobility  Bed Mobility               General bed mobility comments: in chair on arrival  Transfers Overall transfer level: Needs assistance   Transfers: Sit to/from Stand Sit to Stand: Min assist         General transfer comment: cues for hand placement, LLE position and safety with increased time to rise to standing  Ambulation/Gait Ambulation/Gait assistance: Min guard Ambulation Distance (Feet): 75 Feet Assistive device: Rolling walker (2 wheeled) Gait Pattern/deviations: Step-to pattern;Decreased stride length;Trunk flexed   Gait velocity interpretation: Below normal speed for age/gender General Gait Details: cues for posture, position in RW, sequence and chair to follow for safety and fatigue   Stairs            Wheelchair Mobility    Modified Rankin (Stroke Patients Only)       Balance                                     Cognition Arousal/Alertness: Awake/alert Behavior During Therapy: WFL for tasks assessed/performed Overall Cognitive Status: Within Functional Limits for tasks assessed                      Exercises Total Joint Exercises Short Arc QuadBarbaraann Boys;Left;10 reps;Seated Heel Slides: AAROM;Left;10 reps;Seated Hip ABduction/ADduction: AROM;Left;10 reps;Seated Goniometric ROM: 8-72    General Comments        Pertinent Vitals/Pain Pain Assessment: 0-10 Pain Score: 5  Pain Location: left knee Pain Descriptors / Indicators: Aching Pain Intervention(s): Limited activity within patient's tolerance;Monitored during session;Premedicated before session;Repositioned    Home Living                      Prior Function            PT Goals (current goals can now be found in the care plan section) Progress towards PT goals: Progressing toward goals    Frequency       PT Plan Current plan remains appropriate    Co-evaluation             End of Session Equipment Utilized During Treatment: Gait belt Activity Tolerance: Patient tolerated treatment well Patient left: in chair;with call bell/phone within reach;with family/visitor present     Time: 4098-1191 PT Time Calculation (min) (ACUTE ONLY): 43  min  Charges:  $Gait Training: 8-22 mins $Therapeutic Exercise: 8-22 mins $Therapeutic Activity: 8-22 mins                    G Codes:      [name], [name] 11/06/2015, 9:35 AM [name], PT [phone]

## 2015-11-06 NOTE — Progress Notes (Addendum)
[facility] PROGRESS NOTE  Alan Roman ZOX:[id number] DOB: 04-12-1946 DOA: 11/03/2015 PCP: Ignatius Specking., MD  Assessment/Plan: Orthostatic hypotension postoperatively Suspected due to dehydration. Workup including UA, blood cultures and cortisol have been negative. Had mildly elevated lactic acid postoperatively which has now resolved. Blood pressure medications have been discontinued and given aggressive IV hydration. Patient feels much better today. He does have very mild dizziness on walking with physical therapy (after several steps). However orthostasis checked this morning is negative. Patient is on 2 different blood pressure medications (amlodipine and Cozaar). Have instructed him to stop in them until he sees his PCP in 1 week. Patient has a blood pressure monitor at home and have instructed to check his blood pressure every day and if possible check orthostasis. Daughter understands and plans to check his orthostasis at home.   Left total knee replacement Pain control and PT recommendations per orthopedics. Started on Xarelto for DVT prophylaxis.  Hypokalemia Replenished  We'll sign off. Please call for any questions.   Code Status: Full code Family Communication: Daughter at bedside Disposition Plan: Home per primary team.     Procedures:  Left knee total arthroplasty  Antibiotics:  None  HPI/Subjective: Seen and examined. Reports mild dizziness after walking several steps. Orthostasis. Checked this morning and negative.  Objective: Filed Vitals:   11/06/15 0531 11/06/15 0807  BP: 128/70   Pulse: 97   Temp: 98.7 F (37.1 C) 98 F (36.7 C)  Resp: 19     Intake/Output Summary (Last 24 hours) at 11/06/15 1036 Last data filed at 11/06/15 0338  Gross per 24 hour  Intake    240 ml  Output   2000 ml  Net  -1760 ml   Filed Weights   11/03/15 0850  Weight: 69.854 kg (154 lb)    Exam:   General: Elderly male not in distress   HEENT: No  pallor, moist mucosa  Chest: Clear bilaterally  Cardiovascular: S1 and S2, no murmurs rub or gallop  Abdomen: Nondistended, nontender  Musculoskeletal: Warm, left knee incision site is clean  CNS: Alert and oriented  Data Reviewed: Basic Metabolic Panel:  Recent Labs Lab 11/04/15 0457 11/05/15 0615 11/06/15 0545  NA 134* 136 135  K 4.0 3.5 3.3*  CL 99* 100* 104  CO2 GLUCOSE 108* 104* 139*  BUN CREATININE 1.13 1.01 0.91  CALCIUM 7.8* 7.9* 7.1*   Liver Function Tests:  Recent Labs Lab 11/05/15 1537  AST 27  ALT 12*  ALKPHOS 53  BILITOT 0.9  PROT 5.8*  ALBUMIN 2.6*   No results for input(s): LIPASE, AMYLASE in the last 168 hours. No results for input(s): AMMONIA in the last 168 hours. CBC:  Recent Labs Lab 11/04/15 0457 11/05/15 0615 11/06/15 0545  WBC 7.5 7.8 7.0  HGB 11.7* 11.0* 9.3*  HCT 33.4* 31.3* 26.3*  MCV 93.0 93.2 92.9  PLT 134* 123* 113*   Cardiac Enzymes: No results for input(s): CKTOTAL, CKMB, CKMBINDEX, TROPONINI in the last 168 hours. BNP (last 3 results) No results for input(s): BNP in the last 8760 hours.  ProBNP (last 3 results) No results for input(s): PROBNP in the last 8760 hours.  CBG: No results for input(s): GLUCAP in the last 168 hours.  Recent Results (from the past 240 hour(s))  Surgical pcr screen     Status: None   Collection Time: 10/28/15  9:36 AM  Result Value Ref Range Status   MRSA, PCR NEGATIVE NEGATIVE Final  Staphylococcus aureus NEGATIVE NEGATIVE Final    Comment:        The Xpert SA Assay (FDA approved for NASAL specimens in patients over 71 years of age), is one component of a comprehensive surveillance program.  Test performance has been validated by [facility] for patients greater than or equal to 71 year old. It is not intended to diagnose infection nor to guide or monitor treatment.   Urine culture     Status: None   Collection Time: 10/28/15  9:37 AM  Result Value Ref  Range Status   Specimen Description URINE, CLEAN CATCH  Final   Special Requests NONE  Final   Culture 7,000 COLONIES/mL INSIGNIFICANT GROWTH  Final   Report Status 10/29/2015 FINAL  Final  Culture, blood (Routine X 2) w Reflex to ID Panel     Status: None (Preliminary result)   Collection Time: 11/05/15  3:37 PM  Result Value Ref Range Status   Specimen Description BLOOD RIGHT ARM  Final   Special Requests IN PEDIATRIC BOTTLE 2CC  Final   Culture PENDING  Incomplete   Report Status PENDING  Incomplete  Culture, blood (Routine X 2) w Reflex to ID Panel     Status: None (Preliminary result)   Collection Time: 11/05/15  3:37 PM  Result Value Ref Range Status   Specimen Description BLOOD RIGHT HAND  Final   Special Requests IN PEDIATRIC BOTTLE 4CC  Final   Culture PENDING  Incomplete   Report Status PENDING  Incomplete  Urine culture     Status: None (Preliminary result)   Collection Time: 11/05/15  9:44 PM  Result Value Ref Range Status   Specimen Description URINE, CLEAN CATCH  Final   Special Requests NONE  Final   Culture TOO YOUNG TO READ  Final   Report Status PENDING  Incomplete     Studies: No results found.  Scheduled Meds: . docusate sodium  100 mg Oral BID  . polyethylene glycol  17 g Oral QHS  . rivaroxaban  10 mg Oral Q breakfast  . sodium phosphate  1 enema Rectal Once   Continuous Infusions:     Time spent: 15 minutes    Annison Birchard  [facility] Pager [phone] If 7PM-7AM, please contact night-coverage at [email or web address], password [email or web address] 11/06/2015, 10:36 AM  LOS: 3 days

## 2015-11-06 NOTE — Progress Notes (Signed)
Spoke with MD Cleophas DunkerWhitfield concerning patient's health status at this time- patient has finished bolus and is working with PT again

## 2015-11-06 NOTE — Care Management Important Message (Signed)
Important Message  Patient Details  Name: Alan Roman MRN: [id number] Date of Birth: [birth year]   Medicare Important Message Given:  Yes    Kyla BalzarineShealy, Jazel Nimmons Abena 11/06/2015, 10:29 AM

## 2015-11-06 NOTE — Progress Notes (Signed)
Orthopedic Tech Progress Note Patient Details:  Alan Roman Nile [birth year] [id number]  Patient ID: Alan Roman Alan Roman, male   DOB: [birth year], 70 y.o.   MRN: [id number] Placed pt's lle on cpm @0 -50 degrees; will increase as pt tolerates; RN notified  Nikki DomCrawford, [name] 11/06/2015, 5:04 PM

## 2015-11-06 NOTE — Progress Notes (Signed)
Physical Therapy note Pt continues to make slow steady progress with gait and mobility with continued difficulty with strength and ROM. Pt with OT for tub transfer end of session and encouraged CPM use today.    11/06/15 1248  PT Visit Information  Last PT Received On 11/06/15  Assistance Needed +1  History of Present Illness 70 yo male S/P LEFT total knee replacement using cement with orthostatic hypotension post op. PMH: arthritis, HTN   PT Time Calculation  PT Start Time (ACUTE ONLY) 1247  PT Stop Time (ACUTE ONLY) 1310  PT Time Calculation (min) (ACUTE ONLY) 23 min  Precautions  Precautions Knee;Fall  Restrictions  Weight Bearing Restrictions Yes  LLE Weight Bearing PWB  LLE Partial Weight Bearing Percentage or Pounds 50  Pain Assessment  Pain Assessment 0-10  Pain Score 4  Pain Location right thigh  Pain Intervention(s) Limited activity within patient's tolerance;Monitored during session;Repositioned  Cognition  Arousal/Alertness Awake/alert  Behavior During Therapy WFL for tasks assessed/performed  Overall Cognitive Status Within Functional Limits for tasks assessed  Bed Mobility  General bed mobility comments in chair on arrival  Transfers  Overall transfer level Needs assistance  Transfers Sit to/from Stand  Sit to Stand Min guard  General transfer comment cues for hand placement, LLE position and safety with increased time to rise to standing  Ambulation/Gait  Ambulation/Gait assistance Min guard  Ambulation Distance (Feet) 100 Feet  Assistive device Rolling walker (2 wheeled)  Gait Pattern/deviations Step-to pattern;Trunk flexed;Narrow base of support  General Gait Details cues for posture, position in RW, sequence and chair to follow for safety and fatigue  Gait velocity decreased  Gait velocity interpretation Below normal speed for age/gender  Total Joint Exercises  Short Arc MetcalfeQuad AAROM;Left;Seated;15 reps  Heel Slides AAROM;Left;Seated;15 reps  Hip  ABduction/ADduction AAROM;Left;15 reps;Seated  PT - End of Session  Equipment Utilized During Treatment Gait belt  Activity Tolerance Patient tolerated treatment well  Patient left in chair (with OT )  PT - Assessment/Plan  PT Plan Current plan remains appropriate  Follow Up Recommendations Home health PT;Supervision - Intermittent  PT Goal Progression  Progress towards PT goals Progressing toward goals  PT General Charges  $$ ACUTE PT VISIT 1 Procedure  PT Treatments  $Gait Training 8-22 mins  $Therapeutic Exercise 8-22 mins  918 Golf StreetMaija Tabor Leo Fray, PT [phone]

## 2015-11-07 LAB — PROCALCITONIN: Procalcitonin: <0.1 ng/mL

## 2015-11-07 NOTE — Progress Notes (Signed)
   Subjective:  Patient reports pain as marked.  Seems to have significant problems with pain but feels ready to go home.  Objective:   VITALS:   Filed Vitals:   11/06/15 1425 11/06/15 1954 11/06/15 2152 11/07/15 0453  BP: 118/76  112/69 116/76  Pulse: 101  101 87  Temp: 98.8 F (37.1 C) 99.7 F (37.6 C) 99.6 F (37.6 C) 98.8 F (37.1 C)  TempSrc: Oral Oral Oral Oral  Resp: 16  16 16   Weight:      SpO2:   100% 100%    Neurologically intact Neurovascular intact Sensation intact distally Intact pulses distally Dorsiflexion/Plantar flexion intact Incision: dressing C/D/I and no drainage No cellulitis present Compartment soft   Lab Results  Component Value Date   WBC 7.0 11/06/2015   HGB 9.3* 11/06/2015   HCT 26.3* 11/06/2015   MCV 92.9 11/06/2015   PLT 113* 11/06/2015     Assessment/Plan:  4 Days Post-Op   - Expected postop acute blood loss anemia - will monitor for symptoms - Up with PT/OT - DVT ppx - SCDs, ambulation, xarelto - WBAT operative extremity - Pain control - Discharge planning - home today - patient wants ambulance to transport him home - appreciate SW assistance with this  Alan Roman, Alan Roman 11/07/2015, 9:02 AM [phone]

## 2015-11-07 NOTE — Progress Notes (Signed)
Discharge instructions gone over with patient and daughters. Home medications discussed. Prescriptions given. Follow up appointment is made. Diet, activity, and incisional care gone over.family and patient understand how to use cpm machine.  Reasons to call the doctor gone over. Patient will have cpm machine delivered to home and is going home by ambulance. Ptar has been called.  Patient verbalized understanding of instructions.

## 2015-11-07 NOTE — Progress Notes (Signed)
[facility] PROGRESS NOTE  Jagdishcha Stencil WJX:[id number] DOB: 02-12-1946 DOA: 11/03/2015 PCP: Ignatius Specking., MD  Assessment/Plan: Orthostatic hypotension postoperatively Improved with IV fluids. Was plan for discharge yesterday but became dizzy while participating with PT. Given a liter of IV normal saline bolus. Blood pressure this morning stable with mild orthostasis. No further dizziness symptoms. Recommend to continue holding his blood pressure medications, adequate by mouth hydration and blood pressure monitoring at home. Follow-up with PCP in 1 week.   Left total knee replacement Pain control and PT recommendations per orthopedics. Started on Xarelto for DVT prophylaxis.  Hypokalemia Replenished     Code Status: Full code Family Communication: Wife and Daughter at bedside Disposition Plan: Plan for discharge home.     Procedures:  Left knee total arthroplasty  Antibiotics:  None  HPI/Subjective: Seen and examined. No further dizziness. Discharge yesterday held as patient was dizzy.  Objective: Filed Vitals:   11/06/15 2152 11/07/15 0453  BP: 112/69 116/76  Pulse: 101 87  Temp: 99.6 F (37.6 C) 98.8 F (37.1 C)  Resp: 16 16    Intake/Output Summary (Last 24 hours) at 11/07/15 1144 Last data filed at 11/07/15 1056  Gross per 24 hour  Intake    480 ml  Output   2050 ml  Net  -1570 ml   Filed Weights   11/03/15 0850  Weight: 69.854 kg (154 lb)    Exam:   General:  not in distress   HEENT: moist mucosa  Chest: Clear bilaterally  Cardiovascular: S1 and S2 normal  Abdomen: Nondistended, nontender  Musculoskeletal: Warm, left knee incision site is clean    Data Reviewed: Basic Metabolic Panel:  Recent Labs Lab 11/04/15 0457 11/05/15 0615 11/06/15 0545  NA 134* 136 135  K 4.0 3.5 3.3*  CL 99* 100* 104  CO2 GLUCOSE 108* 104* 139*  BUN CREATININE 1.13 1.01 0.91  CALCIUM 7.8* 7.9* 7.1*   Liver Function  Tests:  Recent Labs Lab 11/05/15 1537  AST 27  ALT 12*  ALKPHOS 53  BILITOT 0.9  PROT 5.8*  ALBUMIN 2.6*   No results for input(s): LIPASE, AMYLASE in the last 168 hours. No results for input(s): AMMONIA in the last 168 hours. CBC:  Recent Labs Lab 11/04/15 0457 11/05/15 0615 11/06/15 0545  WBC 7.5 7.8 7.0  HGB 11.7* 11.0* 9.3*  HCT 33.4* 31.3* 26.3*  MCV 93.0 93.2 92.9  PLT 134* 123* 113*   Cardiac Enzymes: No results for input(s): CKTOTAL, CKMB, CKMBINDEX, TROPONINI in the last 168 hours. BNP (last 3 results) No results for input(s): BNP in the last 8760 hours.  ProBNP (last 3 results) No results for input(s): PROBNP in the last 8760 hours.  CBG: No results for input(s): GLUCAP in the last 168 hours.  Recent Results (from the past 240 hour(s))  Culture, blood (Routine X 2) w Reflex to ID Panel     Status: None (Preliminary result)   Collection Time: 11/05/15  3:37 PM  Result Value Ref Range Status   Specimen Description BLOOD RIGHT ARM  Final   Special Requests IN PEDIATRIC BOTTLE 2CC  Final   Culture NO GROWTH 2 DAYS  Final   Report Status PENDING  Incomplete  Culture, blood (Routine X 2) w Reflex to ID Panel     Status: None (Preliminary result)   Collection Time: 11/05/15  3:37 PM  Result Value Ref Range Status   Specimen Description BLOOD RIGHT HAND  Final   Special Requests IN PEDIATRIC BOTTLE 4CC  Final   Culture NO GROWTH 2 DAYS  Final   Report Status PENDING  Incomplete  Urine culture     Status: None   Collection Time: 11/05/15  9:44 PM  Result Value Ref Range Status   Specimen Description URINE, CLEAN CATCH  Final   Special Requests NONE  Final   Culture MULTIPLE SPECIES PRESENT, SUGGEST RECOLLECTION  Final   Report Status 11/06/2015 FINAL  Final     Studies: No results found.  Scheduled Meds: . docusate sodium  100 mg Oral BID  . polyethylene glycol  17 g Oral QHS  . rivaroxaban  10 mg Oral Q breakfast   Continuous Infusions:      Time spent: 15 minutes    Lauree Yurick  [facility] Pager [phone] If 7PM-7AM, please contact night-coverage at [email or web address], password [email or web address] 11/07/2015, 11:44 AM  LOS: 4 days

## 2015-11-07 NOTE — Progress Notes (Signed)
Physical Therapy Treatment Patient Details Name: Alan Roman MRN: [id number] DOB: 09/21/1945 Today's Date: 11/07/2015    History of Present Illness 70 yo male S/P LEFT total knee replacement using cement with orthostatic hypotension post op. PMH: arthritis, HTN     PT Comments    Pt progressing slowly towards all goals. Limited by pain. Discussed stair negotiation of 4in step into home and car transfers. Daughter present with verbal understanding.  Follow Up Recommendations  Home health PT;Supervision - Intermittent     Equipment Recommendations  Rolling walker with 5" wheels;3in1 (PT)    Recommendations for Other Services       Precautions / Restrictions Precautions Precautions: Knee;Fall Restrictions Weight Bearing Restrictions: Yes LLE Weight Bearing: Partial weight bearing LLE Partial Weight Bearing Percentage or Pounds: 50    Mobility  Bed Mobility               General bed mobility comments: pt up in chair upon PT arrival  Transfers Overall transfer level: Needs assistance Equipment used: Rolling walker (2 wheeled) Transfers: Sit to/from Stand Sit to Stand: Min guard         General transfer comment: v/c's for hand placement, minA to steady pt during hand transition  Ambulation/Gait Ambulation/Gait assistance: Min guard Ambulation Distance (Feet): 100 Feet Assistive device: Rolling walker (2 wheeled) Gait Pattern/deviations: Step-to pattern Gait velocity: dec Gait velocity interpretation: Below normal speed for age/gender General Gait Details: encouraged continuous pushing of RW to increase fluidity of gait pattern. pt took 3 standing rest breaks but denied dizziness. pt adhered to [facility] tolerance   Stairs            Wheelchair Mobility    Modified Rankin (Stroke Patients Only)       Balance Overall balance assessment: Needs assistance         Standing balance support: Bilateral upper extremity supported Standing  balance-Leahy Scale: Poor Standing balance comment: reliant on RW                    Cognition Arousal/Alertness: Awake/alert Behavior During Therapy: WFL for tasks assessed/performed Overall Cognitive Status: Within Functional Limits for tasks assessed                      Exercises Total Joint Exercises Quad Sets: PROM;Left;10 reps;Seated Heel Slides: AAROM;Left;Seated;15 reps Goniometric ROM: active 60 deg flexion in sitting    General Comments        Pertinent Vitals/Pain Pain Assessment: 0-10 Pain Score: 8  Pain Location: L knee Pain Intervention(s): Monitored during session    Home Living                      Prior Function            PT Goals (current goals can now be found in the care plan section) Progress towards PT goals: Progressing toward goals    Frequency  7X/week    PT Plan Current plan remains appropriate    Co-evaluation             End of Session Equipment Utilized During Treatment: Gait belt Activity Tolerance: Patient tolerated treatment well Patient left: in chair     Time: 1026-1055 PT Time Calculation (min) (ACUTE ONLY): 29 min  Charges:  $Gait Training: 8-22 mins $Therapeutic Exercise: 8-22 mins                    G Codes:  Alan Roman 11/07/2015, 12:16 Alan Roman   Alan Roman, PT, DPT Pager #: [phone] Office #: [phone]

## 2015-11-07 NOTE — Progress Notes (Signed)
CSW placed PTAR form on patient's chart. Nurse to call for transport when patient is ready.  CSW signing off.  Osborne Cascoadia Hollynn Garno LCSWA [phone]

## 2015-11-08 DIAGNOSIS — I10 Essential (primary) hypertension: Secondary | ICD-10-CM | POA: Diagnosis not present

## 2015-11-08 DIAGNOSIS — Z96652 Presence of left artificial knee joint: Secondary | ICD-10-CM | POA: Diagnosis not present

## 2015-11-08 DIAGNOSIS — Z471 Aftercare following joint replacement surgery: Secondary | ICD-10-CM | POA: Diagnosis not present

## 2015-11-08 DIAGNOSIS — D696 Thrombocytopenia, unspecified: Secondary | ICD-10-CM | POA: Diagnosis not present

## 2015-11-08 DIAGNOSIS — D649 Anemia, unspecified: Secondary | ICD-10-CM | POA: Diagnosis not present

## 2015-11-08 DIAGNOSIS — I951 Orthostatic hypotension: Secondary | ICD-10-CM | POA: Diagnosis not present

## 2015-11-08 DIAGNOSIS — L4054 Psoriatic juvenile arthropathy: Secondary | ICD-10-CM | POA: Diagnosis not present

## 2015-11-10 ENCOUNTER — Other Ambulatory Visit: Payer: Self-pay | Admitting: Orthopaedic Surgery

## 2015-11-10 ENCOUNTER — Ambulatory Visit
Admission: RE | Admit: 2015-11-10 | Discharge: 2015-11-10 | Disposition: A | Discharge status: Home / Self Care | Payer: Medicare Other | Source: Ambulatory Visit | Attending: Orthopaedic Surgery | Admitting: Orthopaedic Surgery

## 2015-11-10 DIAGNOSIS — R0602 Shortness of breath: Secondary | ICD-10-CM | POA: Diagnosis not present

## 2015-11-10 DIAGNOSIS — R05 Cough: Secondary | ICD-10-CM | POA: Diagnosis not present

## 2015-11-10 LAB — CULTURE, BLOOD (ROUTINE X 2)
Culture: NO GROWTH
Culture: NO GROWTH

## 2015-11-10 MED ORDER — IOPAMIDOL (ISOVUE-370) INJECTION 76%
100.0000 mL | Freq: Once | INTRAVENOUS | Status: AC | PRN
  Administered 2015-11-10: 100 mL via INTRAVENOUS

## 2015-11-11 DIAGNOSIS — K219 Gastro-esophageal reflux disease without esophagitis: Secondary | ICD-10-CM | POA: Diagnosis not present

## 2015-11-11 DIAGNOSIS — R0609 Other forms of dyspnea: Secondary | ICD-10-CM | POA: Diagnosis not present

## 2015-11-11 DIAGNOSIS — E785 Hyperlipidemia, unspecified: Secondary | ICD-10-CM | POA: Diagnosis not present

## 2015-11-11 DIAGNOSIS — I2510 Atherosclerotic heart disease of native coronary artery without angina pectoris: Secondary | ICD-10-CM | POA: Diagnosis not present

## 2015-11-11 DIAGNOSIS — D62 Acute posthemorrhagic anemia: Secondary | ICD-10-CM | POA: Diagnosis not present

## 2015-11-12 DIAGNOSIS — Z471 Aftercare following joint replacement surgery: Secondary | ICD-10-CM | POA: Diagnosis not present

## 2015-11-12 DIAGNOSIS — L4054 Psoriatic juvenile arthropathy: Secondary | ICD-10-CM | POA: Diagnosis not present

## 2015-11-12 DIAGNOSIS — D649 Anemia, unspecified: Secondary | ICD-10-CM | POA: Diagnosis not present

## 2015-11-12 DIAGNOSIS — Z96652 Presence of left artificial knee joint: Secondary | ICD-10-CM | POA: Diagnosis not present

## 2015-11-12 DIAGNOSIS — I951 Orthostatic hypotension: Secondary | ICD-10-CM | POA: Diagnosis not present

## 2015-11-12 DIAGNOSIS — I2510 Atherosclerotic heart disease of native coronary artery without angina pectoris: Secondary | ICD-10-CM | POA: Diagnosis not present

## 2015-11-12 DIAGNOSIS — I10 Essential (primary) hypertension: Secondary | ICD-10-CM | POA: Diagnosis not present

## 2015-11-12 DIAGNOSIS — R0602 Shortness of breath: Secondary | ICD-10-CM | POA: Diagnosis not present

## 2015-11-13 DIAGNOSIS — I10 Essential (primary) hypertension: Secondary | ICD-10-CM | POA: Diagnosis not present

## 2015-11-13 DIAGNOSIS — Z96652 Presence of left artificial knee joint: Secondary | ICD-10-CM | POA: Diagnosis not present

## 2015-11-13 DIAGNOSIS — I951 Orthostatic hypotension: Secondary | ICD-10-CM | POA: Diagnosis not present

## 2015-11-13 DIAGNOSIS — L4054 Psoriatic juvenile arthropathy: Secondary | ICD-10-CM | POA: Diagnosis not present

## 2015-11-13 DIAGNOSIS — Z471 Aftercare following joint replacement surgery: Secondary | ICD-10-CM | POA: Diagnosis not present

## 2015-11-13 DIAGNOSIS — D649 Anemia, unspecified: Secondary | ICD-10-CM | POA: Diagnosis not present

## 2015-11-16 DIAGNOSIS — I10 Essential (primary) hypertension: Secondary | ICD-10-CM | POA: Diagnosis not present

## 2015-11-16 DIAGNOSIS — Z471 Aftercare following joint replacement surgery: Secondary | ICD-10-CM | POA: Diagnosis not present

## 2015-11-16 DIAGNOSIS — R0602 Shortness of breath: Secondary | ICD-10-CM | POA: Diagnosis not present

## 2015-11-16 DIAGNOSIS — I951 Orthostatic hypotension: Secondary | ICD-10-CM | POA: Diagnosis not present

## 2015-11-16 DIAGNOSIS — Z96652 Presence of left artificial knee joint: Secondary | ICD-10-CM | POA: Diagnosis not present

## 2015-11-16 DIAGNOSIS — D649 Anemia, unspecified: Secondary | ICD-10-CM | POA: Diagnosis not present

## 2015-11-16 DIAGNOSIS — I2510 Atherosclerotic heart disease of native coronary artery without angina pectoris: Secondary | ICD-10-CM | POA: Diagnosis not present

## 2015-11-16 DIAGNOSIS — L4054 Psoriatic juvenile arthropathy: Secondary | ICD-10-CM | POA: Diagnosis not present

## 2015-11-17 DIAGNOSIS — I10 Essential (primary) hypertension: Secondary | ICD-10-CM | POA: Diagnosis not present

## 2015-11-17 DIAGNOSIS — Z96652 Presence of left artificial knee joint: Secondary | ICD-10-CM | POA: Diagnosis not present

## 2015-11-17 DIAGNOSIS — Z471 Aftercare following joint replacement surgery: Secondary | ICD-10-CM | POA: Diagnosis not present

## 2015-11-17 DIAGNOSIS — D649 Anemia, unspecified: Secondary | ICD-10-CM | POA: Diagnosis not present

## 2015-11-17 DIAGNOSIS — L4054 Psoriatic juvenile arthropathy: Secondary | ICD-10-CM | POA: Diagnosis not present

## 2015-11-17 DIAGNOSIS — I951 Orthostatic hypotension: Secondary | ICD-10-CM | POA: Diagnosis not present

## 2015-11-18 DIAGNOSIS — I951 Orthostatic hypotension: Secondary | ICD-10-CM | POA: Diagnosis not present

## 2015-11-18 DIAGNOSIS — Z471 Aftercare following joint replacement surgery: Secondary | ICD-10-CM | POA: Diagnosis not present

## 2015-11-18 DIAGNOSIS — I10 Essential (primary) hypertension: Secondary | ICD-10-CM | POA: Diagnosis not present

## 2015-11-18 DIAGNOSIS — L4054 Psoriatic juvenile arthropathy: Secondary | ICD-10-CM | POA: Diagnosis not present

## 2015-11-18 DIAGNOSIS — D649 Anemia, unspecified: Secondary | ICD-10-CM | POA: Diagnosis not present

## 2015-11-18 DIAGNOSIS — Z96652 Presence of left artificial knee joint: Secondary | ICD-10-CM | POA: Diagnosis not present

## 2015-11-19 DIAGNOSIS — L4054 Psoriatic juvenile arthropathy: Secondary | ICD-10-CM | POA: Diagnosis not present

## 2015-11-19 DIAGNOSIS — I10 Essential (primary) hypertension: Secondary | ICD-10-CM | POA: Diagnosis not present

## 2015-11-19 DIAGNOSIS — D649 Anemia, unspecified: Secondary | ICD-10-CM | POA: Diagnosis not present

## 2015-11-19 DIAGNOSIS — Z471 Aftercare following joint replacement surgery: Secondary | ICD-10-CM | POA: Diagnosis not present

## 2015-11-19 DIAGNOSIS — I951 Orthostatic hypotension: Secondary | ICD-10-CM | POA: Diagnosis not present

## 2015-11-19 DIAGNOSIS — Z96652 Presence of left artificial knee joint: Secondary | ICD-10-CM | POA: Diagnosis not present

## 2015-11-20 DIAGNOSIS — Z96652 Presence of left artificial knee joint: Secondary | ICD-10-CM | POA: Diagnosis not present

## 2015-11-20 DIAGNOSIS — L4054 Psoriatic juvenile arthropathy: Secondary | ICD-10-CM | POA: Diagnosis not present

## 2015-11-20 DIAGNOSIS — I10 Essential (primary) hypertension: Secondary | ICD-10-CM | POA: Diagnosis not present

## 2015-11-20 DIAGNOSIS — Z471 Aftercare following joint replacement surgery: Secondary | ICD-10-CM | POA: Diagnosis not present

## 2015-11-20 DIAGNOSIS — I951 Orthostatic hypotension: Secondary | ICD-10-CM | POA: Diagnosis not present

## 2015-11-20 DIAGNOSIS — D649 Anemia, unspecified: Secondary | ICD-10-CM | POA: Diagnosis not present

## 2015-11-24 DIAGNOSIS — I951 Orthostatic hypotension: Secondary | ICD-10-CM | POA: Diagnosis not present

## 2015-11-24 DIAGNOSIS — D649 Anemia, unspecified: Secondary | ICD-10-CM | POA: Diagnosis not present

## 2015-11-24 DIAGNOSIS — I10 Essential (primary) hypertension: Secondary | ICD-10-CM | POA: Diagnosis not present

## 2015-11-24 DIAGNOSIS — Z471 Aftercare following joint replacement surgery: Secondary | ICD-10-CM | POA: Diagnosis not present

## 2015-11-24 DIAGNOSIS — Z96652 Presence of left artificial knee joint: Secondary | ICD-10-CM | POA: Diagnosis not present

## 2015-11-24 DIAGNOSIS — L4054 Psoriatic juvenile arthropathy: Secondary | ICD-10-CM | POA: Diagnosis not present

## 2015-11-26 DIAGNOSIS — L4054 Psoriatic juvenile arthropathy: Secondary | ICD-10-CM | POA: Diagnosis not present

## 2015-11-26 DIAGNOSIS — Z471 Aftercare following joint replacement surgery: Secondary | ICD-10-CM | POA: Diagnosis not present

## 2015-11-26 DIAGNOSIS — I10 Essential (primary) hypertension: Secondary | ICD-10-CM | POA: Diagnosis not present

## 2015-11-26 DIAGNOSIS — Z96652 Presence of left artificial knee joint: Secondary | ICD-10-CM | POA: Diagnosis not present

## 2015-11-26 DIAGNOSIS — I951 Orthostatic hypotension: Secondary | ICD-10-CM | POA: Diagnosis not present

## 2015-11-26 DIAGNOSIS — D649 Anemia, unspecified: Secondary | ICD-10-CM | POA: Diagnosis not present

## 2015-11-27 DIAGNOSIS — L4054 Psoriatic juvenile arthropathy: Secondary | ICD-10-CM | POA: Diagnosis not present

## 2015-11-27 DIAGNOSIS — Z96652 Presence of left artificial knee joint: Secondary | ICD-10-CM | POA: Diagnosis not present

## 2015-11-27 DIAGNOSIS — D649 Anemia, unspecified: Secondary | ICD-10-CM | POA: Diagnosis not present

## 2015-11-27 DIAGNOSIS — I10 Essential (primary) hypertension: Secondary | ICD-10-CM | POA: Diagnosis not present

## 2015-11-27 DIAGNOSIS — Z471 Aftercare following joint replacement surgery: Secondary | ICD-10-CM | POA: Diagnosis not present

## 2015-11-27 DIAGNOSIS — I951 Orthostatic hypotension: Secondary | ICD-10-CM | POA: Diagnosis not present

## 2015-12-01 DIAGNOSIS — L4054 Psoriatic juvenile arthropathy: Secondary | ICD-10-CM | POA: Diagnosis not present

## 2015-12-01 DIAGNOSIS — D649 Anemia, unspecified: Secondary | ICD-10-CM | POA: Diagnosis not present

## 2015-12-01 DIAGNOSIS — Z96652 Presence of left artificial knee joint: Secondary | ICD-10-CM | POA: Diagnosis not present

## 2015-12-01 DIAGNOSIS — I10 Essential (primary) hypertension: Secondary | ICD-10-CM | POA: Diagnosis not present

## 2015-12-01 DIAGNOSIS — Z471 Aftercare following joint replacement surgery: Secondary | ICD-10-CM | POA: Diagnosis not present

## 2015-12-01 DIAGNOSIS — I951 Orthostatic hypotension: Secondary | ICD-10-CM | POA: Diagnosis not present

## 2015-12-02 DIAGNOSIS — I2510 Atherosclerotic heart disease of native coronary artery without angina pectoris: Secondary | ICD-10-CM | POA: Diagnosis not present

## 2015-12-02 DIAGNOSIS — D62 Acute posthemorrhagic anemia: Secondary | ICD-10-CM | POA: Diagnosis not present

## 2015-12-02 DIAGNOSIS — R0609 Other forms of dyspnea: Secondary | ICD-10-CM | POA: Diagnosis not present

## 2015-12-02 DIAGNOSIS — K219 Gastro-esophageal reflux disease without esophagitis: Secondary | ICD-10-CM | POA: Diagnosis not present

## 2015-12-03 DIAGNOSIS — I951 Orthostatic hypotension: Secondary | ICD-10-CM | POA: Diagnosis not present

## 2015-12-03 DIAGNOSIS — D649 Anemia, unspecified: Secondary | ICD-10-CM | POA: Diagnosis not present

## 2015-12-03 DIAGNOSIS — I10 Essential (primary) hypertension: Secondary | ICD-10-CM | POA: Diagnosis not present

## 2015-12-03 DIAGNOSIS — L4054 Psoriatic juvenile arthropathy: Secondary | ICD-10-CM | POA: Diagnosis not present

## 2015-12-03 DIAGNOSIS — Z96652 Presence of left artificial knee joint: Secondary | ICD-10-CM | POA: Diagnosis not present

## 2015-12-03 DIAGNOSIS — Z471 Aftercare following joint replacement surgery: Secondary | ICD-10-CM | POA: Diagnosis not present

## 2015-12-04 DIAGNOSIS — D649 Anemia, unspecified: Secondary | ICD-10-CM | POA: Diagnosis not present

## 2015-12-04 DIAGNOSIS — I10 Essential (primary) hypertension: Secondary | ICD-10-CM | POA: Diagnosis not present

## 2015-12-04 DIAGNOSIS — Z471 Aftercare following joint replacement surgery: Secondary | ICD-10-CM | POA: Diagnosis not present

## 2015-12-04 DIAGNOSIS — L4054 Psoriatic juvenile arthropathy: Secondary | ICD-10-CM | POA: Diagnosis not present

## 2015-12-04 DIAGNOSIS — I951 Orthostatic hypotension: Secondary | ICD-10-CM | POA: Diagnosis not present

## 2015-12-04 DIAGNOSIS — Z96652 Presence of left artificial knee joint: Secondary | ICD-10-CM | POA: Diagnosis not present

## 2015-12-07 ENCOUNTER — Ambulatory Visit: Payer: Medicare Other | Attending: Orthopaedic Surgery | Admitting: Physical Therapy

## 2015-12-07 DIAGNOSIS — M25562 Pain in left knee: Secondary | ICD-10-CM | POA: Insufficient documentation

## 2015-12-07 DIAGNOSIS — M25662 Stiffness of left knee, not elsewhere classified: Secondary | ICD-10-CM | POA: Diagnosis not present

## 2015-12-07 DIAGNOSIS — R600 Localized edema: Secondary | ICD-10-CM

## 2015-12-07 NOTE — Patient Instructions (Signed)
Knee Extension Mobilization: Towel Prop   With rolled towel under right ankle, place __light__ pound weight across knee. Hold __5__ minutes. Repeat __1-2__ times per set. Do ___1_ sets per session. Do ___2_ sessions per day.  Hamstring Step 1   Straighten left knee. Keep knee level with other knee or on bolster. Hold _30__ seconds. Relax knee by returning foot to start. Repeat _3-5_ times.      KNEE: Quadriceps - Prone    Place strap around ankle. Bring ankle toward buttocks. Press hip into surface. Hold __30_ seconds. __3_ reps per set, _1__ sets per day, __3-5_ days per week   Copyright  VHI. All rights reserved.     Hip Flexion / Knee Extension: Straight-Leg Raise (Eccentric)   Lie on back. Lift leg with knee straight. Slowly lower leg for 3-5 seconds. __10_ reps per set, __1-2_ sets per day, __5_ days per week. Lower like elevator, stopping at each floor. HKNEE: Extension, Long Arc Quads - Sitting     Raise leg until knee is straight. _10__ reps per set, _2__ sets per day, _5__ days per week  FLEXION: Sitting (Active)  Copyright  VHI. All rights reserved.    Chair Knee Flexion   Keeping feet on floor, slide foot of operated leg back, bending knee. Hold _10-15___ seconds. Repeat __5__ times. Do __2__ sessions a day.  Heel Slide   Bend left knee and pull heel toward buttocks. Use strap around foot and pull strap with arms to assist knee to bend further. Hold 10 secs.  Repeat _5___ times. Do __2__ sessions per day.

## 2015-12-08 NOTE — Therapy (Signed)
[facility] Outpatient Rehabilitation [facility] [address] [place], [place], [address] Phone: [phone]   Fax:  [phone]  Physical Therapy Evaluation  Patient Details  Name: Alan Roman MRN: [id number] Date of Birth: [birth year] Referring Provider: Dr. Norlene Campbell   Encounter Date: 12/07/2015      PT End of Session - 12/07/15 2002    Visit Number 1   Number of Visits 18   Date for PT Re-Evaluation 01/11/16   PT Start Time 1022  FOTO   PT Stop Time 1102   PT Time Calculation (min) 40 min   Activity Tolerance Patient tolerated treatment well   Behavior During Therapy [facility] for tasks assessed/performed      Past Medical History  Diagnosis Date  . Hypertension   . History of hiatal hernia 2007; 2009  . Arthritis     "knees" (11/03/2015)    Past Surgical History  Procedure Laterality Date  . Video bronchoscopy Bilateral 01/31/2013    Procedure: VIDEO BRONCHOSCOPY WITHOUT FLUORO;  Surgeon: Merwyn Katos, MD;  Location: [facility] ENDOSCOPY;  Service: Cardiopulmonary;  Laterality: Bilateral;  . Total knee arthroplasty Left 11/03/2015  . Joint replacement    . Hiatal hernia repair  2007; 2009  . Hernia repair    . Cataract extraction w/ intraocular lens  implant, bilateral Bilateral   . Total knee arthroplasty Left 11/03/2015    Procedure: Left Total Knee Arthroplasty;  Surgeon: Valeria Batman, MD;  Location: [facility] OR;  Service: Orthopedics;  Laterality: Left;    There were no vitals filed for this visit.       Subjective Assessment - 12/07/15 1020    Subjective On 11/03/15 Mr. Coviello underwent  L TKR.  He had HHPT for approx.  3 weeks, (12 visits).  He cont to have stiffness and pain in L knee with exertion.   He has min difficuly with sleeping, walking, doing stairs at times.  He had CPM for approx. 2 weeks and reports doing his HEP 2-3 times per day.    Patient is accompained by: Family member   Limitations Walking;Standing;House hold activities   How  long can you walk comfortably? 10-15 min    Patient Stated Goals Get stronger, less stiffness   Currently in Pain? Yes   Pain Score 2   pre med   Pain Location Knee   Pain Orientation Left   Pain Descriptors / Indicators Sore;Tightness   Pain Type Surgical pain   Pain Radiating Towards sometimes into the shin   Pain Onset More than a month ago   Pain Frequency Intermittent   Aggravating Factors  walking, bending    Pain Relieving Factors ice, Aleve, heat, positioning   Multiple Pain Sites No            [facility] PT Assessment - 12/07/15 1035    Assessment   Medical Diagnosis L TKR   Referring Provider Dr. Norlene Campbell    Onset Date/Surgical Date 11/03/15   Next MD Visit a few weeks   Prior Therapy HHPT   Precautions   Precautions None   Restrictions   Weight Bearing Restrictions No   Balance Screen   Has the patient fallen in the past 6 months No   Has the patient had a decrease in activity level because of a fear of falling?  No   Is the patient reluctant to leave their home because of a fear of falling?  No   Home Tourist information centre manager residence  Living Arrangements Spouse/significant other   Type of Home House   Home Access Stairs to enter   Entrance Stairs-Number of Steps 1   Entrance Stairs-Rails None   Home Layout Two level;Full bath on main level   Prior Function   Level of Independence Independent with household mobility with device   Vocation Retired   IT consultant   Overall Cognitive Status Within Functional Limits for tasks assessed   Observation/Other Assessments-Edema    Edema Circumferential   Circumferential Edema   Circumferential - Right 13 inch   Circumferential - Left  13.75 inch   Sensation   Light Touch Appears Intact   Coordination   Gross Motor Movements are Fluid and Coordinated Not tested   Posture/Postural Control   Posture/Postural Control Postural limitations   Postural Limitations Forward head;Decreased lumbar  lordosis;Posterior pelvic tilt   Posture Comments L knee flexed    AROM   Right Knee Extension -3   Right Knee Flexion 152   Left Knee Extension -8   Left Knee Flexion 96   Strength   Right Hip Flexion 4+/5   Right Hip ABduction 4+/5   Left Hip Flexion 4/5   Left Hip ABduction 4+/5   Right Knee Flexion 5/5   Right Knee Extension 5/5   Left Knee Flexion 4/5   Left Knee Extension 5/5   Right Ankle Dorsiflexion 5/5   Left Ankle Dorsiflexion 5/5   Palpation   Patella mobility hypomobile   Palpation comment min edema , sore M/L joint line and some into lateral hamstring    Transfers   Transfers Sit to Stand   Ambulation/Gait   Ambulation Distance (Feet) 300 Feet   Assistive device Straight cane   Gait Pattern Step-through pattern   Ambulation Surface Level;Indoor                   [facility] Adult PT Treatment/Exercise - 12/07/15 1035    Self-Care   Self-Care Heat/Ice Application   Heat/Ice Application ice >heat   Other Self-Care Comments  HEP   Knee/Hip Exercises: Stretches   Active Hamstring Stretch Left;2 reps;60 seconds   Knee: Self-Stretch to increase Flexion 3 reps;30 seconds   Knee/Hip Exercises: Aerobic   Stationary Bike Level 2, 5 min for AAROM able to get full revolution with min effort                PT Education - 12/07/15 2001    Education provided Yes   Education Details PT/POC, HEP, edema   Person(s) Educated Patient;Spouse   Methods Explanation;Demonstration;Handout   Comprehension Verbalized understanding;Need further instruction          PT Short Term Goals - 12/08/15 0606    PT SHORT TERM GOAL #1   Title Pt will be I with HEP for knee AROM and strength   Time 3   Status New   PT SHORT TERM GOAL #2   Title Pt will be able to walk with less limp and proper gait mechanics with min cueing   Time 3   Period Weeks   Status New   PT SHORT TERM GOAL #3   Title Pt will be able to demo no more than -5 deg knee ext for improved gait in  L knee.    Time 3   Period Weeks   Status New           PT Long Term Goals - 12/08/15 0606    PT LONG TERM GOAL #1   Title  Pt will be I with more advanced HEP for L knee.    Time 6   Period Weeks   Status New   PT LONG TERM GOAL #2   Title Pt will understand RICE and use appropriately to manage edema    Time 6   Period Weeks   Status New   PT LONG TERM GOAL #3   Title Pt will be able to walk in the community as needed without limitation of pain, LRAD.    Time 6   Period Weeks   Status New   PT LONG TERM GOAL #4   Title Pt will be able to bend L knee to 120 deg or more for improved knee function and transfers.    Time 6   Period Weeks   Status New   PT LONG TERM GOAL #5   Title Pt will be able to report rare instance of pain waking him from sleep.    Time 6   Period Weeks   Status New               Plan - 12/07/15 2004    Clinical Impression Statement Pt presents for low complexity eval of L TKR which was done on 11/03/15. He has deficits in AROM, hip strength which limit his mobiity.  Swelling and pain are minimal.  He plans to have the Rt. knee replaced in the near future.     Rehab Potential Excellent   PT Frequency 3x / week  2-3 times    PT Duration 6 weeks   PT Treatment/Interventions ADLs/Self Care Home Management;Ultrasound;Neuromuscular re-education;Passive range of motion;Patient/family education;Gait training;Functional mobility training;Electrical Stimulation;Moist Heat;Therapeutic exercise;Manual techniques;Therapeutic activities;Vasopneumatic Device;Taping   PT Next Visit Plan level 1-2 knee review, NuStep/Bike, vaso, gait    PT Home Exercise Plan knee 1-2 , has HHPT but i also gave him several mostly open chain   Consulted and Agree with Plan of Care Patient;Family member/caregiver   Family Member Consulted spouse      Patient will benefit from skilled therapeutic intervention in order to improve the following deficits and impairments:  Abnormal  gait, Decreased range of motion, Difficulty walking, Increased fascial restricitons, Pain, Hypomobility, Impaired flexibility, Postural dysfunction, Increased edema, Decreased strength, Decreased mobility  Visit Diagnosis: Stiffness of left knee, not elsewhere classified  Pain in left knee  Localized edema      G-Codes - Dec 23, 2015 0759    Functional Assessment Tool Used FOTO   Functional Limitation Mobility: Walking and moving around   Mobility: Walking and Moving Around Current Status [phone]) At least 40 percent but less than 60 percent impaired, limited or restricted   Mobility: Walking and Moving Around Goal Status [phone]) At least 20 percent but less than 40 percent impaired, limited or restricted       Problem List Patient Active Problem List   Diagnosis Date Noted  . Orthostatic hypotension 11/05/2015  . Constipation 11/05/2015  . Thrombocytopenia (HCC) 11/05/2015  . Normocytic anemia 11/05/2015  . Psoriatic arthritis (HCC) 11/03/2015  . Essential hypertension 11/03/2015  . Osteoarthritis of left knee 11/03/2015  . S/P total knee replacement using cement 11/03/2015  . Transaminitis 02/02/2013  . Extrapulmonary TB (tuberculosis) 02/02/2013  . Pulmonary infiltrate 01/29/2013  . Pleural effusion 01/26/2013  . Acute bronchiolitis due to other infectious organisms 01/26/2013  . Hyponatremia 01/24/2013  . Psoriasis 01/23/2013  . Fever, unknown origin 01/23/2013  . Immunocompromised state (HCC) 01/23/2013    Henna Derderian 12-23-15, 8:02 AM  [place] [facility] [address] [place], [place], [address] Phone: [phone]   Fax:  [phone]  Name: Alan Roman MRN: [id number] Date of Birth: May 15, 1946   Karie MainlandJennifer Tyshae Stair, PT 12/08/2015 8:02 AM Phone: [phone] Fax: [phone]

## 2015-12-15 ENCOUNTER — Ambulatory Visit: Payer: Medicare Other | Admitting: Physical Therapy

## 2015-12-15 DIAGNOSIS — R600 Localized edema: Secondary | ICD-10-CM

## 2015-12-15 DIAGNOSIS — M25562 Pain in left knee: Secondary | ICD-10-CM | POA: Diagnosis not present

## 2015-12-15 DIAGNOSIS — M25662 Stiffness of left knee, not elsewhere classified: Secondary | ICD-10-CM

## 2015-12-15 NOTE — Therapy (Signed)
[facility] Outpatient Rehabilitation [facility] [address] [place], [place], [address] Phone: [phone]   Fax:  [phone]  Physical Therapy Treatment  Patient Details  Name: Alan Roman MRN: [id number] Date of Birth: 1946-02-06 Referring Provider: Dr. Norlene Campbell   Encounter Date: 12/15/2015      PT End of Session - 12/15/15 1426    Visit Number 2   Number of Visits 18   Date for PT Re-Evaluation 01/11/16   PT Start Time 1420   PT Stop Time 1513   PT Time Calculation (min) 53 min   Activity Tolerance Patient tolerated treatment well   Behavior During Therapy [facility] for tasks assessed/performed      Past Medical History  Diagnosis Date  . Hypertension   . History of hiatal hernia 2007; 2009  . Arthritis     "knees" (11/03/2015)    Past Surgical History  Procedure Laterality Date  . Video bronchoscopy Bilateral 01/31/2013    Procedure: VIDEO BRONCHOSCOPY WITHOUT FLUORO;  Surgeon: Merwyn Katos, MD;  Location: [facility] ENDOSCOPY;  Service: Cardiopulmonary;  Laterality: Bilateral;  . Total knee arthroplasty Left 11/03/2015  . Joint replacement    . Hiatal hernia repair  2007; 2009  . Hernia repair    . Cataract extraction w/ intraocular lens  implant, bilateral Bilateral   . Total knee arthroplasty Left 11/03/2015    Procedure: Left Total Knee Arthroplasty;  Surgeon: Valeria Batman, MD;  Location: [facility] OR;  Service: Orthopedics;  Laterality: Left;    There were no vitals filed for this visit.      Subjective Assessment - 12/15/15 1424    Subjective Saw Dr. and he was pleased with knee.  No new complaints .  Knee feels a little stiff.  Min pain in back .    Currently in Pain? Yes   Pain Score 3    Pain Location Knee   Pain Orientation Left   Pain Descriptors / Indicators Sore   Pain Type Surgical pain   Pain Onset More than a month ago   Pain Frequency Intermittent   Multiple Pain Sites Yes             [facility] Adult PT Treatment/Exercise  - 12/15/15 1435    Knee/Hip Exercises: Stretches   Active Hamstring Stretch Left;2 reps;30 seconds   Quad Stretch 2 reps;30 seconds   Knee: Self-Stretch to increase Flexion Left;5 reps;20 seconds   Knee/Hip Exercises: Aerobic   Stationary Bike Level 2, 5 min for AAROM able to get full revolution with min effort   Knee/Hip Exercises: Supine   Quad Sets Strengthening;Left;1 set;10 reps   Straight Leg Raises Left;1 set;10 reps   Straight Leg Raise with External Rotation Left;1 set;10 reps   Knee/Hip Exercises: Prone   Hamstring Curl 1 set;15 reps   Hamstring Curl Limitations 5 lbs tight in ant hip    Prone Knee Hang 1 minute   Vasopneumatic   Number Minutes Vasopneumatic  15 minutes   Vasopnuematic Location  Knee   Vasopneumatic Pressure Low   Vasopneumatic Temperature  32                   PT Short Term Goals - 12/15/15 1427    PT SHORT TERM GOAL #1   Title Pt will be I with HEP for knee AROM and strength   Status On-going   PT SHORT TERM GOAL #2   Title Pt will be able to walk with less limp and proper gait  mechanics with min cueing   Status On-going   PT SHORT TERM GOAL #3   Title Pt will be able to demo no more than -5 deg knee ext for improved gait in L knee.    Status On-going           PT Long Term Goals - 12/15/15 1427    PT LONG TERM GOAL #1   Title Pt will be I with more advanced HEP for L knee.    Status On-going   PT LONG TERM GOAL #2   Title Pt will understand RICE and use appropriately to manage edema    Status On-going   PT LONG TERM GOAL #3   Title Pt will be able to walk in the community as needed without limitation of pain, LRAD.    Status On-going   PT LONG TERM GOAL #4   Title Pt will be able to bend L knee to 120 deg or more for improved knee function and transfers.    Status On-going   PT LONG TERM GOAL #5   Title Pt will be able to report rare instance of pain waking him from sleep.    Status On-going               Plan -  12/15/15 1426    Clinical Impression Statement Did well today, prone positioning may have aggravated low back pain.  Suggested he stick to supine stretching.  Measured at 111 deg today.    PT Next Visit Plan level 1-2 knee review, NuStep/Bike, vaso, gait    PT Home Exercise Plan knee 1-2 , has HHPT but i also gave him several mostly open chain   Consulted and Agree with Plan of Care Patient      Patient will benefit from skilled therapeutic intervention in order to improve the following deficits and impairments:  Abnormal gait, Decreased range of motion, Difficulty walking, Increased fascial restricitons, Pain, Hypomobility, Impaired flexibility, Postural dysfunction, Increased edema, Decreased strength, Decreased mobility  Visit Diagnosis: Stiffness of left knee, not elsewhere classified  Pain in left knee  Localized edema     Problem List Patient Active Problem List   Diagnosis Date Noted  . Orthostatic hypotension 11/05/2015  . Constipation 11/05/2015  . Thrombocytopenia (HCC) 11/05/2015  . Normocytic anemia 11/05/2015  . Psoriatic arthritis (HCC) 11/03/2015  . Essential hypertension 11/03/2015  . Osteoarthritis of left knee 11/03/2015  . S/P total knee replacement using cement 11/03/2015  . Transaminitis 02/02/2013  . Extrapulmonary TB (tuberculosis) 02/02/2013  . Pulmonary infiltrate 01/29/2013  . Pleural effusion 01/26/2013  . Acute bronchiolitis due to other infectious organisms 01/26/2013  . Hyponatremia 01/24/2013  . Psoriasis 01/23/2013  . Fever, unknown origin 01/23/2013  . Immunocompromised state (HCC) 01/23/2013    Alan Roman 12/15/2015, 3:00 PM  [facility] [address] [place], [place], [address] Phone: [phone]   Fax:  [phone]  Name: Alan Roman MRN: [id number] Date of Birth: [birth year]    Karie MainlandJennifer Darran Gabay, PT 12/15/2015 3:00 PM Phone: [phone] Fax: [phone]

## 2015-12-17 ENCOUNTER — Ambulatory Visit: Payer: Medicare Other | Admitting: Physical Therapy

## 2015-12-17 DIAGNOSIS — R600 Localized edema: Secondary | ICD-10-CM | POA: Diagnosis not present

## 2015-12-17 DIAGNOSIS — M25562 Pain in left knee: Secondary | ICD-10-CM

## 2015-12-17 DIAGNOSIS — M25662 Stiffness of left knee, not elsewhere classified: Secondary | ICD-10-CM

## 2015-12-17 NOTE — Therapy (Signed)
[facility] [address] [place], [place], [address] Phone: [phone]   Fax:  [phone]  Physical Therapy Treatment  Patient Details  Name: Alan AlbaJagdishcha Appleton MRN: [id number] Date of Birth: 1945/09/20 Referring Provider: Dr. Norlene CampbellPeter Whitfield   Encounter Date: 12/17/2015      PT End of Session - 12/17/15 1520    Visit Number 3   Number of Visits 18   Date for PT Re-Evaluation 01/11/16   PT Start Time 1500   PT Stop Time 1551   PT Time Calculation (min) 51 min   Activity Tolerance Patient tolerated treatment well   Behavior [facility] for tasks assessed/performed      Past Medical History  Diagnosis Date  . Hypertension   . History of hiatal hernia 2007; 2009  . Arthritis     "knees" (11/03/2015)    Past Surgical History  Procedure Laterality Date  . Video bronchoscopy Bilateral 01/31/2013    Procedure: VIDEO BRONCHOSCOPY WITHOUT FLUORO;  Surgeon: Merwyn Katosavid B Simonds, MD;  [facility];  Service: Cardiopulmonary;  Laterality: Bilateral;  . Total knee arthroplasty Left 11/03/2015  . Joint replacement    . Hiatal hernia repair  2007; 2009  . Hernia repair    . Cataract extraction w/ intraocular lens  implant, bilateral Bilateral   . Total knee arthroplasty Left 11/03/2015    Procedure: Left Total Knee Arthroplasty;  Surgeon: Valeria BatmanPeter W Whitfield, MD;  [facility];  Service: Orthopedics;  Laterality: Left;    There were no vitals filed for this visit.      Subjective Assessment - 12/17/15 1511    Subjective 4/10 today in knee.  Had to use the stairs today x 3 (14 steps x 3) and that increased his pain today (normally stays on 1st level) . The ice treatment really helped (Vaso)   Currently in Pain? Yes   Pain Score 4   as previous, see 5/16              [facility] Adult PT Treatment/Exercise - 12/17/15 1512    Knee/Hip Exercises: Aerobic   Stationary Bike 6 min level 1 on recumbant bike, increased  flexion angle today.   Knee/Hip Exercises: Supine   Short Arc Quad Sets Strengthening;Left;1 set;20 reps   Heel Slides AAROM;Left;1 set;10 reps   Terminal Knee Extension Strengthening;Left;1 set;10 reps   Hip Adduction Isometric Strengthening;Both;1 set;10 reps   Bridges with Harley-DavidsonBall Squeeze Strengthening;Both;1 set;10 reps   Straight Leg Raises Left;1 set;10 reps   Straight Leg Raise with External Rotation Left;1 set;10 reps   Patellar Mobs gentle   Knee/Hip Exercises: Sidelying   Hip ABduction Strengthening;Both;1 set   Hip ABduction Limitations knee bent    Clams x 20    Vasopneumatic   Number Minutes Vasopneumatic  15 minutes   Vasopnuematic Location  Knee   Vasopneumatic Pressure Low   Vasopneumatic Temperature  32    Manual Therapy   Manual Therapy Edema management;Soft tissue mobilization   Edema Management retromassage and patellar mob   Soft tissue mobilization vastus lateralis and ITB                PT Education - 12/17/15 1520    Education provided Yes   Education Details HEP   Person(s) Educated Patient   Methods Explanation;Demonstration   Comprehension Verbalized understanding;Returned demonstration          PT Short Term Goals - 12/15/15 1427    PT SHORT TERM GOAL #1  Title Pt will be I with HEP for knee AROM and strength   Status On-going   PT SHORT TERM GOAL #2   Title Pt will be able to walk with less limp and proper gait mechanics with min cueing   Status On-going   PT SHORT TERM GOAL #3   Title Pt will be able to demo no more than -5 deg knee ext for improved gait in L knee.    Status On-going           PT Long Term Goals - 12/15/15 1427    PT LONG TERM GOAL #1   Title Pt will be I with more advanced HEP for L knee.    Status On-going   PT LONG TERM GOAL #2   Title Pt will understand RICE and use appropriately to manage edema    Status On-going   PT LONG TERM GOAL #3   Title Pt will be able to walk in the community as needed  without limitation of pain, LRAD.    Status On-going   PT LONG TERM GOAL #4   Title Pt will be able to bend L knee to 120 deg or more for improved knee function and transfers.    Status On-going   PT LONG TERM GOAL #5   Title Pt will be able to report rare instance of pain waking him from sleep.    Status On-going               Plan - 12/17/15 1522    Clinical Impression Statement Patient with a bit more pain today due to overexertion today, chose to keep ther ex to mat level. No pain in low back today.     PT Next Visit Plan level 1-2 knee review, NuStep/Bike, vaso, gait , standing /wall squat   PT Home Exercise Plan knee 1-2 , has HHPT but i also gave him several mostly open chain   Consulted and Agree with Plan of Care Patient      Patient will benefit from skilled therapeutic intervention in order to improve the following deficits and impairments:  Abnormal gait, Decreased range of motion, Difficulty walking, Increased fascial restricitons, Pain, Hypomobility, Impaired flexibility, Postural dysfunction, Increased edema, Decreased strength, Decreased mobility  Visit Diagnosis: Stiffness of left knee, not elsewhere classified  Pain in left knee  Localized edema     Problem List Patient Active Problem List   Diagnosis Date Noted  . Orthostatic hypotension 11/05/2015  . Constipation 11/05/2015  . Thrombocytopenia (HCC) 11/05/2015  . Normocytic anemia 11/05/2015  . Psoriatic arthritis (HCC) 11/03/2015  . Essential hypertension 11/03/2015  . Osteoarthritis of left knee 11/03/2015  . S/P total knee replacement using cement 11/03/2015  . Transaminitis 02/02/2013  . Extrapulmonary TB (tuberculosis) 02/02/2013  . Pulmonary infiltrate 01/29/2013  . Pleural effusion 01/26/2013  . Acute bronchiolitis due to other infectious organisms 01/26/2013  . Hyponatremia 01/24/2013  . Psoriasis 01/23/2013  . Fever, unknown origin 01/23/2013  . Immunocompromised state (HCC)  01/23/2013    PAA,JENNIFER 12/17/2015, 3:46 PM  [facility] [address] [place], [place], [address] Phone: [phone]   Fax:  [phone]  Name: Ronin Rehfeldt MRN: [id number] Date of Birth: 07-09-1946    Karie Mainland, PT 12/17/2015 3:46 PM Phone: [phone] Fax: [phone]

## 2015-12-18 ENCOUNTER — Ambulatory Visit: Payer: Medicare Other | Admitting: Physical Therapy

## 2015-12-18 DIAGNOSIS — M25662 Stiffness of left knee, not elsewhere classified: Secondary | ICD-10-CM

## 2015-12-18 DIAGNOSIS — R600 Localized edema: Secondary | ICD-10-CM | POA: Diagnosis not present

## 2015-12-18 DIAGNOSIS — M159 Polyosteoarthritis, unspecified: Secondary | ICD-10-CM | POA: Diagnosis not present

## 2015-12-18 DIAGNOSIS — M25562 Pain in left knee: Secondary | ICD-10-CM

## 2015-12-18 DIAGNOSIS — I10 Essential (primary) hypertension: Secondary | ICD-10-CM | POA: Diagnosis not present

## 2015-12-18 NOTE — Therapy (Signed)
[facility] Outpatient Rehabilitation [facility] [address] [place], [place], [address] Phone: [phone]   Fax:  [phone]  Physical Therapy Treatment  Patient Details  Name: Alan Roman MRN: [id number] Date of Birth: [birth year] Referring Provider: Dr. Norlene Campbell   Encounter Date: 12/18/2015      PT End of Session - 12/18/15 1001    Visit Number 4   Number of Visits 18   Date for PT Re-Evaluation 01/11/16   PT Start Time 0925      Past Medical History  Diagnosis Date  . Hypertension   . History of hiatal hernia 2007; 2009  . Arthritis     "knees" (11/03/2015)    Past Surgical History  Procedure Laterality Date  . Video bronchoscopy Bilateral 01/31/2013    Procedure: VIDEO BRONCHOSCOPY WITHOUT FLUORO;  Surgeon: Merwyn Katos, MD;  Location: [facility] ENDOSCOPY;  Service: Cardiopulmonary;  Laterality: Bilateral;  . Total knee arthroplasty Left 11/03/2015  . Joint replacement    . Hiatal hernia repair  2007; 2009  . Hernia repair    . Cataract extraction w/ intraocular lens  implant, bilateral Bilateral   . Total knee arthroplasty Left 11/03/2015    Procedure: Left Total Knee Arthroplasty;  Surgeon: Valeria Batman, MD;  Location: [facility] OR;  Service: Orthopedics;  Laterality: Left;    There were no vitals filed for this visit.      Subjective Assessment - 12/18/15 0930    Subjective Was sore last night.  Not much pain this am.     Currently in Pain? Yes  not rated             [facility] PT Assessment - 12/18/15 1011    AROM   Left Knee Extension -2   Left Knee Flexion 105           [facility] Adult PT Treatment/Exercise - 12/18/15 0931    Knee/Hip Exercises: Stretches   Active Hamstring Stretch Both;2 reps;30 seconds   Knee: Self-Stretch to increase Flexion Left;5 reps;10 seconds   Gastroc Stretch Both;2 reps;30 seconds   Knee/Hip Exercises: Aerobic   Nustep level 5, 5 min for warm up, knee ROM    Knee/Hip Exercises: Standing   Heel  Raises Both;1 set;10 reps   Hip Abduction Stengthening;Both;1 set;10 reps   Lateral Step Up Both;2 sets;Hand Hold: 2;Step Height: 4";Step Height: 6"   Forward Step Up Right;2 sets;10 reps;Hand Hold: 2;Hand Hold: 1   Step Down --   Knee/Hip Exercises: Supine   Short Arc Quad Sets Strengthening;Left;1 set;20 reps;Other (comment)   Short Arc Quad Sets Limitations 5 lbs   Heel Slides AAROM;Strengthening;Left;1 set;20 reps   Heel Slides Limitations 5 lbs   Bridges Limitations bridge with ball   x 2 x 10 (no UE support 2nd set)    Straight Leg Raises Strengthening;Left;1 set;10 reps;Other (comment)   Straight Leg Raises Limitations 5 lbs   Other Supine Knee/Hip Exercises hamstring curl with ball x 10 and ball bridge with knees bend x 10    Vasopneumatic   Number Minutes Vasopneumatic  15 minutes   Vasopnuematic Location  Knee   Vasopneumatic Pressure Low   Vasopneumatic Temperature  32                 PT Education - 12/17/15 1520    Education provided Yes   Education Details HEP   Person(s) Educated Patient   Methods Explanation;Demonstration   Comprehension Verbalized understanding;Returned demonstration  PT Short Term Goals - 12/18/15 1012    PT SHORT TERM GOAL #1   Title Pt will be I with HEP for knee AROM and strength   Status On-going   PT SHORT TERM GOAL #2   Title Pt will be able to walk with less limp and proper gait mechanics with min cueing   Status On-going   PT SHORT TERM GOAL #3   Title Pt will be able to demo no more than -5 deg knee ext for improved gait in L knee.    Status Achieved           PT Long Term Goals - 12/18/15 1012    PT LONG TERM GOAL #1   Title Pt will be I with more advanced HEP for L knee.    Status On-going   PT LONG TERM GOAL #2   Title Pt will understand RICE and use appropriately to manage edema    Status Achieved   PT LONG TERM GOAL #3   Title Pt will be able to walk in the community as needed without limitation  of pain, LRAD.    Status On-going   PT LONG TERM GOAL #4   Title Pt will be able to bend L knee to 120 deg or more for improved knee function and transfers.    Status On-going   PT LONG TERM GOAL #5   Title Pt will be able to report rare instance of pain waking him from sleep.    Status On-going               Plan - 12/18/15 [id number]    Clinical Impression Statement Tolerated closed chain exercises well today.  Cues for knee control with step ups. AROM increaed to 2- to105 today in supine.    PT Next Visit Plan level 1-2 knee review, NuStep/Bike, vaso, gait , standing /wall squat   PT Home Exercise Plan knee 1-2 , has HHPT but i also gave him several mostly open chain   Consulted and Agree with Plan of Care Patient      Patient will benefit from skilled therapeutic intervention in order to improve the following deficits and impairments:  Abnormal gait, Decreased range of motion, Difficulty walking, Increased fascial restricitons, Pain, Hypomobility, Impaired flexibility, Postural dysfunction, Increased edema, Decreased strength, Decreased mobility  Visit Diagnosis: Stiffness of left knee, not elsewhere classified  Pain in left knee  Localized edema     Problem List Patient Active Problem List   Diagnosis Date Noted  . Orthostatic hypotension 11/05/2015  . Constipation 11/05/2015  . Thrombocytopenia (HCC) 11/05/2015  . Normocytic anemia 11/05/2015  . Psoriatic arthritis (HCC) 11/03/2015  . Essential hypertension 11/03/2015  . Osteoarthritis of left knee 11/03/2015  . S/P total knee replacement using cement 11/03/2015  . Transaminitis 02/02/2013  . Extrapulmonary TB (tuberculosis) 02/02/2013  . Pulmonary infiltrate 01/29/2013  . Pleural effusion 01/26/2013  . Acute bronchiolitis due to other infectious organisms 01/26/2013  . Hyponatremia 01/24/2013  . Psoriasis 01/23/2013  . Fever, unknown origin 01/23/2013  . Immunocompromised state (HCC) 01/23/2013     Mattea Seger 12/18/2015, 10:13 AM  [facility] [address] [place], [place], [address] Phone: [phone]   Fax:  [phone]  Name: Liston AlbaJagdishcha Egner MRN: [id number] Date of Birth: Jun 10, 1946    Karie MainlandJennifer Ralphael Southgate, PT 12/18/2015 10:13 AM Phone: [phone] Fax: [phone]

## 2015-12-21 ENCOUNTER — Ambulatory Visit: Payer: Medicare Other | Admitting: Physical Therapy

## 2015-12-21 DIAGNOSIS — M25562 Pain in left knee: Secondary | ICD-10-CM

## 2015-12-21 DIAGNOSIS — M25662 Stiffness of left knee, not elsewhere classified: Secondary | ICD-10-CM

## 2015-12-21 DIAGNOSIS — R600 Localized edema: Secondary | ICD-10-CM

## 2015-12-21 NOTE — Therapy (Signed)
[facility] Outpatient Rehabilitation [facility] [address] [place], [place], [address] Phone: [phone]   Fax:  [phone]  Physical Therapy Treatment  Patient Details  Name: Alan Roman MRN: [id number] Date of Birth: [birth year] Referring Provider: Dr. Norlene Campbell   Encounter Date: 12/21/2015      PT End of Session - 12/21/15 1104    Visit Number 5   Number of Visits 18   Date for PT Re-Evaluation 01/11/16   PT Start Time 1101   PT Stop Time 1200   PT Time Calculation (min) 59 min      Past Medical History  Diagnosis Date  . Hypertension   . History of hiatal hernia 2007; 2009  . Arthritis     "knees" (11/03/2015)    Past Surgical History  Procedure Laterality Date  . Video bronchoscopy Bilateral 01/31/2013    Procedure: VIDEO BRONCHOSCOPY WITHOUT FLUORO;  Surgeon: Merwyn Katos, MD;  Location: [facility] ENDOSCOPY;  Service: Cardiopulmonary;  Laterality: Bilateral;  . Total knee arthroplasty Left 11/03/2015  . Joint replacement    . Hiatal hernia repair  2007; 2009  . Hernia repair    . Cataract extraction w/ intraocular lens  implant, bilateral Bilateral   . Total knee arthroplasty Left 11/03/2015    Procedure: Left Total Knee Arthroplasty;  Surgeon: Valeria Batman, MD;  Location: [facility] OR;  Service: Orthopedics;  Laterality: Left;    There were no vitals filed for this visit.      Subjective Assessment - 12/21/15 1103    Subjective Saturday my leg was hurting as I was sitting in a chair up high and it was hanging down.  No pain now.    Currently in Pain? No/denies            [facility] PT Assessment - 12/21/15 1147    AROM   Left Knee Flexion 106            [facility] Adult PT Treatment/Exercise - 12/21/15 1115    Lumbar Exercises: Machines for Strengthening   Leg Press 1 plate x 2 x 10 reps    Knee/Hip Exercises: Stretches   Active Hamstring Stretch Left;5 reps   Knee: Self-Stretch to increase Flexion Left;5 reps   Knee: Self-Stretch  Limitations used sheet for flex/ext    Knee/Hip Exercises: Aerobic   Recumbent Bike Level 5 Ue and LE for 6 min strength/ROM    Knee/Hip Exercises: Standing   Knee Flexion AAROM;Strengthening;Both;1 set;15 reps   Knee Flexion Limitations lean over bolster   Hip Extension Stengthening;Left;2 sets;10 reps;Knee bent;Knee straight   Extension Limitations lean over bolster   Other Standing Knee Exercises weightshifts FW for quad activation    Knee/Hip Exercises: Supine   Heel Slides Strengthening;Both;1 set;10 reps   Heel Slides Limitations ball    Bridges Limitations bridge with ball   x 2 x 10 (no UE support 2nd set)    Knee/Hip Exercises: Sidelying   Hip ABduction Strengthening;Both;1 set   Hip ABduction Limitations --   Clams x 20    Other Sidelying Knee/Hip Exercises sidekick series x 10 (flex.ext)    Vasopneumatic   Number Minutes Vasopneumatic  15 minutes   Vasopnuematic Location  Knee   Vasopneumatic Pressure Low   Vasopneumatic Temperature  32   Manual Therapy   Manual Therapy Edema management;Joint mobilization   Joint Mobilization GR II-III flex, ext to tolerance supine and seated  PT Short Term Goals - 12/18/15 1012    PT SHORT TERM GOAL #1   Title Pt will be I with HEP for knee AROM and strength   Status On-going   PT SHORT TERM GOAL #2   Title Pt will be able to walk with less limp and proper gait mechanics with min cueing   Status On-going   PT SHORT TERM GOAL #3   Title Pt will be able to demo no more than -5 deg knee ext for improved gait in L knee.    Status Achieved           PT Long Term Goals - 12/18/15 1012    PT LONG TERM GOAL #1   Title Pt will be I with more advanced HEP for L knee.    Status On-going   PT LONG TERM GOAL #2   Title Pt will understand RICE and use appropriately to manage edema    Status Achieved   PT LONG TERM GOAL #3   Title Pt will be able to walk in the community as needed without limitation of  pain, LRAD.    Status On-going   PT LONG TERM GOAL #4   Title Pt will be able to bend L knee to 120 deg or more for improved knee function and transfers.    Status On-going   PT LONG TERM GOAL #5   Title Pt will be able to report rare instance of pain waking him from sleep.    Status On-going               Plan - 12/21/15 1148    Clinical Impression Statement Patient has good body awareness, needs a bit of UE assist when doing sidelying exercises to avoid upper trunk rotation.  No pain increase today, able to get 106 deg flexion on leg press without too much effort.     PT Next Visit Plan advance HEP and ROM, NuStep/Bike, vaso, gait , standing /wall squat   PT Home Exercise Plan knee 1-2 , has HHPT but i also gave him several mostly open chain   Consulted and Agree with Plan of Care Patient      Patient will benefit from skilled therapeutic intervention in order to improve the following deficits and impairments:  Abnormal gait, Decreased range of motion, Difficulty walking, Increased fascial restricitons, Pain, Hypomobility, Impaired flexibility, Postural dysfunction, Increased edema, Decreased strength, Decreased mobility  Visit Diagnosis: Pain in left knee  Stiffness of left knee, not elsewhere classified  Localized edema     Problem List Patient Active Problem List   Diagnosis Date Noted  . Orthostatic hypotension 11/05/2015  . Constipation 11/05/2015  . Thrombocytopenia (HCC) 11/05/2015  . Normocytic anemia 11/05/2015  . Psoriatic arthritis (HCC) 11/03/2015  . Essential hypertension 11/03/2015  . Osteoarthritis of left knee 11/03/2015  . S/P total knee replacement using cement 11/03/2015  . Transaminitis 02/02/2013  . Extrapulmonary TB (tuberculosis) 02/02/2013  . Pulmonary infiltrate 01/29/2013  . Pleural effusion 01/26/2013  . Acute bronchiolitis due to other infectious organisms 01/26/2013  . Hyponatremia 01/24/2013  . Psoriasis 01/23/2013  . Fever,  unknown origin 01/23/2013  . Immunocompromised state (HCC) 01/23/2013    PAA,JENNIFER 12/21/2015, 1:27 PM  [facility] [address] [place], [place], [address] Phone: [phone]   Fax:  [phone]  Name: Alan Roman MRN: [id number] Date of Birth: 04-Oct-1945    Karie MainlandJennifer Paa, PT 12/21/2015 1:27 PM Phone: [phone] Fax: [phone]

## 2015-12-23 ENCOUNTER — Ambulatory Visit: Payer: Medicare Other | Admitting: Physical Therapy

## 2015-12-23 DIAGNOSIS — M25562 Pain in left knee: Secondary | ICD-10-CM

## 2015-12-23 DIAGNOSIS — R600 Localized edema: Secondary | ICD-10-CM | POA: Diagnosis not present

## 2015-12-23 DIAGNOSIS — M25662 Stiffness of left knee, not elsewhere classified: Secondary | ICD-10-CM | POA: Diagnosis not present

## 2015-12-23 NOTE — Therapy (Signed)
[facility] [address] [place], [place], [address] Phone: [phone]   Fax:  [phone]  Physical Therapy Treatment  Patient Details  Name: Alan AlbaJagdishcha Routt MRN: [id number] Date of Birth: 1946-04-06 Referring Provider: Dr. Norlene CampbellPeter Whitfield   Encounter Date: 12/23/2015      PT End of Session - 12/23/15 0928    Visit Number 6   Number of Visits 18   Date for PT Re-Evaluation 01/11/16   PT Start Time 0850   PT Stop Time 0940   PT Time Calculation (min) 50 min   Activity Tolerance Patient tolerated treatment well   Behavior During Therapy [facility] for tasks assessed/performed      Past Medical History  Diagnosis Date  . Hypertension   . History of hiatal hernia 2007; 2009  . Arthritis     "knees" (11/03/2015)    Past Surgical History  Procedure Laterality Date  . Video bronchoscopy Bilateral 01/31/2013    Procedure: VIDEO BRONCHOSCOPY WITHOUT FLUORO;  Surgeon: Merwyn Katosavid B Simonds, MD;  [facility];  Service: Cardiopulmonary;  Laterality: Bilateral;  . Total knee arthroplasty Left 11/03/2015  . Joint replacement    . Hiatal hernia repair  2007; 2009  . Hernia repair    . Cataract extraction w/ intraocular lens  implant, bilateral Bilateral   . Total knee arthroplasty Left 11/03/2015    Procedure: Left Total Knee Arthroplasty;  Surgeon: Valeria BatmanPeter W Whitfield, MD;  [facility];  Service: Orthopedics;  Laterality: Left;    There were no vitals filed for this visit.      Subjective Assessment - 12/23/15 0851    Subjective No new complaints, pt arrived early to use Nustep.    Currently in Pain? No/denies            [facility] - 12/23/15 0927    PROM   Overall PROM  Other (comment)  Lt. knee flexion to 114 supine                      [facility] Adult PT Treatment/Exercise - 12/23/15 0855    Knee/Hip Exercises: Stretches   Active Hamstring Stretch Left;3 reps;30 seconds   Knee: Self-Stretch to  increase Flexion Left;3 reps;30 seconds   Knee/Hip Exercises: Aerobic   Nustep independent level 5, 5 min for warm up, knee ROM    Knee/Hip Exercises: Standing   SLS with Vectors abd and ext 2 x 10 sets eachleg    Other Standing Knee Exercises weighted gait FW and BW x 3, 10 feet, 4 plates on Freemotion, lateral stepping x 3 each side, cues to land with soft knee    Knee/Hip Exercises: Supine   Bridges Limitations bridge with march x10 with rest breaks    Bridges with Harley-DavidsonBall Squeeze Strengthening;Both;1 set;10 reps  added SLR with ball squeeze   Straight Leg Raises Strengthening;Left;1 set;10 reps   Straight Leg Raise with External Rotation Strengthening;Left;1 set;10 reps   Knee/Hip Exercises: Sidelying   Hip ADduction Strengthening;Left;1 set;10 reps   Vasopneumatic   Number Minutes Vasopneumatic  15 minutes   Vasopnuematic Location  Knee   Vasopneumatic Pressure Low   Vasopneumatic Temperature  32   Manual Therapy   Manual Therapy Passive ROM   Passive ROM flexion L knee with contract relax                PT Education - 12/23/15 0920    Education provided Yes   Education Details hip strength  Person(s) Educated Patient   Methods Explanation   Comprehension Verbalized understanding;Returned demonstration          PT Short Term Goals - 12/23/15 0929    PT SHORT TERM GOAL #1   Title Pt will be I with HEP for knee AROM and strength   Status Achieved   PT SHORT TERM GOAL #2   Title Pt will be able to walk with less limp and proper gait mechanics with min cueing   Status On-going   PT SHORT TERM GOAL #3   Title Pt will be able to demo no more than -5 deg knee ext for improved gait in L knee.    Status Achieved           PT Long Term Goals - 12/23/15 0929    PT LONG TERM GOAL #1   Title Pt will be I with more advanced HEP for L knee.    Status On-going   PT LONG TERM GOAL #2   Title Pt will understand RICE and use appropriately to manage edema    Status  Achieved   PT LONG TERM GOAL #3   Title Pt will be able to walk in the community as needed without limitation of pain, LRAD.    Status On-going   PT LONG TERM GOAL #4   Title Pt will be able to bend L knee to 120 deg or more for improved knee function and transfers.    Status On-going   PT LONG TERM GOAL #5   Title Pt will be able to report rare instance of pain waking him from sleep.    Status On-going               Plan - 12/23/15 1610    Clinical Impression Statement Able to give patient HEP for hip, knee in standing position.  No increased pain.     PT Next Visit Plan advance HEP and ROM, NuStep/Bike, vaso, gait , standing /wall squat   PT Home Exercise Plan knee AAROM, sit to stand, SLR x 4 (stand ext and abd)    Consulted and Agree with Plan of Care Patient      Patient will benefit from skilled therapeutic intervention in order to improve the following deficits and impairments:  Abnormal gait, Decreased range of motion, Difficulty walking, Increased fascial restricitons, Pain, Hypomobility, Impaired flexibility, Postural dysfunction, Increased edema, Decreased strength, Decreased mobility  Visit Diagnosis: Pain in left knee  Stiffness of left knee, not elsewhere classified  Localized edema     Problem List Patient Active Problem List   Diagnosis Date Noted  . Orthostatic hypotension 11/05/2015  . Constipation 11/05/2015  . Thrombocytopenia (HCC) 11/05/2015  . Normocytic anemia 11/05/2015  . Psoriatic arthritis (HCC) 11/03/2015  . Essential hypertension 11/03/2015  . Osteoarthritis of left knee 11/03/2015  . S/P total knee replacement using cement 11/03/2015  . Transaminitis 02/02/2013  . Extrapulmonary TB (tuberculosis) 02/02/2013  . Pulmonary infiltrate 01/29/2013  . Pleural effusion 01/26/2013  . Acute bronchiolitis due to other infectious organisms 01/26/2013  . Hyponatremia 01/24/2013  . Psoriasis 01/23/2013  . Fever, unknown origin 01/23/2013  .  Immunocompromised state (HCC) 01/23/2013    Keyira Mondesir 12/23/2015, 9:42 AM  [facility] [address] [place], [place], [address] Phone: [phone]   Fax:  [phone]  Name: Mylez Venable MRN: [id number] Date of Birth: 1946-05-26    Karie Mainland, PT 12/23/2015 9:42 AM Phone: [phone] Fax: [phone]

## 2015-12-23 NOTE — Patient Instructions (Addendum)
Straight Leg Raise      Tighten stomach and slowly raise locked right leg __12-18__ inches from floor. Repeat __10__ times per set. Do _2___ sets per session. Do _2___ sessions per day.    Leg Adduction: Single Leg (Ankle Weight)    Bottom leg (weighted) and straight, lift leg upward as far as possible. Complete all repetitions to one side. Repeat on other side. Do __2__ sets. Complete __10__ repetitions.  http://st.exer.us/366   Copyright  VHI. All rights reserved.   http://orth.exer.us/1102   Copyright  VHI. All rights reserved.    ABDUCTION: Standing (Active)   Stand, feet flat. Lift right leg out to side. Use _0__ lbs. Complete __10_ repetitions. Perform __2_ sessions per day.     EXTENSION: Standing (Active)  Stand, both feet flat. Draw right leg behind body as far as possible. Use 0___ lbs. Complete 10 repetitions. Perform __2_ sessions per day.  Copyright  VHI. All rights reserved.   Functional Quadriceps: Sit to Stand    Sit on edge of chair, feet flat on floor. Stand upright, extending knees fully. Repeat __10 __ times per set. Do __2__ sets per session. Do __2__ sessions per day.  http://orth.exer.us/734   Copyright  VHI. All rights reserved.

## 2015-12-25 ENCOUNTER — Ambulatory Visit: Payer: Medicare Other | Admitting: Physical Therapy

## 2015-12-25 DIAGNOSIS — M25562 Pain in left knee: Secondary | ICD-10-CM

## 2015-12-25 DIAGNOSIS — M25662 Stiffness of left knee, not elsewhere classified: Secondary | ICD-10-CM

## 2015-12-25 DIAGNOSIS — R600 Localized edema: Secondary | ICD-10-CM | POA: Diagnosis not present

## 2015-12-25 NOTE — Therapy (Signed)
[facility] [address] [place], [place], [address] Phone: [phone]   Fax:  [phone]  Physical Therapy Treatment  Patient Details  Name: Alan Roman MRN: [id number] Date of Birth: 1946/06/02 Referring Provider: Dr. Norlene CampbellPeter Whitfield   Encounter Date: 12/25/2015      PT End of Session - 12/25/15 0933    Visit Number 7   Number of Visits 18   Date for PT Re-Evaluation 01/11/16   PT Start Time 0930   PT Stop Time 1025   PT Time Calculation (min) 55 min   Activity Tolerance Patient tolerated treatment well   Behavior [facility] for tasks assessed/performed      Past Medical History  Diagnosis Date  . Hypertension   . History of hiatal hernia 2007; 2009  . Arthritis     "knees" (11/03/2015)    Past Surgical History  Procedure Laterality Date  . Video bronchoscopy Bilateral 01/31/2013    Procedure: VIDEO BRONCHOSCOPY WITHOUT FLUORO;  Surgeon: Merwyn Katosavid B Simonds, MD;  [facility];  Service: Cardiopulmonary;  Laterality: Bilateral;  . Total knee arthroplasty Left 11/03/2015  . Joint replacement    . Hiatal hernia repair  2007; 2009  . Hernia repair    . Cataract extraction w/ intraocular lens  implant, bilateral Bilateral   . Total knee arthroplasty Left 11/03/2015    Procedure: Left Total Knee Arthroplasty;  Surgeon: Valeria BatmanPeter W Whitfield, MD;  [facility];  Service: Orthopedics;  Laterality: Left;    There were no vitals filed for this visit.      Subjective Assessment - 12/25/15 0933    Subjective no new complaints,               [facility] Adult PT Treatment/Exercise - 12/25/15 0934    Knee/Hip Exercises: Aerobic   Nustep 8 min prior to session level 6 UE and LE    Knee/Hip Exercises: Standing   Step Down Both;1 set;10 reps;20 reps;Hand Hold: 2   Wall Squat 3 sets;10 reps   Wall Squat Limitations iso hold heel taps, toe taps   SLS with Vectors flex/ext and abd/add, semicircle on foam for  balance , done on each leg about 10-15 reps each    Rebounder catch/toss on Single leg each leg several times   mini squat hold x 15    Other Standing Knee Exercises "balance beam" in parallel bars    Other Standing Knee Exercises lateral walking in parallel bars with green band 4 x 15 feet   Cryotherapy   Number Minutes Cryotherapy 8 Minutes   Cryotherapy Location Knee   Type of Cryotherapy Ice pack   Manual Therapy   Manual Therapy Taping   Edema Management retromassage and patellar mob   Kinesiotex Edema   Kinesiotix   Edema 2 fans for L knee edema             PT Short Term Goals - 12/23/15 0929    PT SHORT TERM GOAL #1   Title Pt will be I with HEP for knee AROM and strength   Status Achieved   PT SHORT TERM GOAL #2   Title Pt will be able to walk with less limp and proper gait mechanics with min cueing   Status On-going   PT SHORT TERM GOAL #3   Title Pt will be able to demo no more than -5 deg knee ext for improved gait in L knee.    Status Achieved  PT Long Term Goals - 12/23/15 0929    PT LONG TERM GOAL #1   Title Pt will be I with more advanced HEP for L knee.    Status On-going   PT LONG TERM GOAL #2   Title Pt will understand RICE and use appropriately to manage edema    Status Achieved   PT LONG TERM GOAL #3   Title Pt will be able to walk in the community as needed without limitation of pain, LRAD.    Status On-going   PT LONG TERM GOAL #4   Title Pt will be able to bend L knee to 120 deg or more for improved knee function and transfers.    Status On-going   PT LONG TERM GOAL #5   Title Pt will be able to report rare instance of pain waking him from sleep.    Status On-going               Plan - 12/25/15 1030    Clinical Impression Statement Worked on L knee control and balance today, needs min A from UE support (1) to maintain balance.    PT Next Visit Plan check goals, cont balance, strength, ROM and cold pack   PT Home  Exercise Plan knee AAROM, sit to stand, SLR x 4 (stand ext and abd)    Consulted and Agree with Plan of Care Patient      Patient will benefit from skilled therapeutic intervention in order to improve the following deficits and impairments:  Abnormal gait, Decreased range of motion, Difficulty walking, Increased fascial restricitons, Pain, Hypomobility, Impaired flexibility, Postural dysfunction, Increased edema, Decreased strength, Decreased mobility  Visit Diagnosis: Pain in left knee  Stiffness of left knee, not elsewhere classified  Localized edema     Problem List Patient Active Problem List   Diagnosis Date Noted  . Orthostatic hypotension 11/05/2015  . Constipation 11/05/2015  . Thrombocytopenia (HCC) 11/05/2015  . Normocytic anemia 11/05/2015  . Psoriatic arthritis (HCC) 11/03/2015  . Essential hypertension 11/03/2015  . Osteoarthritis of left knee 11/03/2015  . S/P total knee replacement using cement 11/03/2015  . Transaminitis 02/02/2013  . Extrapulmonary TB (tuberculosis) 02/02/2013  . Pulmonary infiltrate 01/29/2013  . Pleural effusion 01/26/2013  . Acute bronchiolitis due to other infectious organisms 01/26/2013  . Hyponatremia 01/24/2013  . Psoriasis 01/23/2013  . Fever, unknown origin 01/23/2013  . Immunocompromised state (HCC) 01/23/2013    PAA,JENNIFER 12/25/2015, 12:07 PM  [facility] [address] [place], [place], [address] Phone: [phone]   Fax:  [phone]  Name: Alan Roman MRN: [id number] Date of Birth: 25-Jul-1946   Karie Mainland, PT 12/25/2015 12:07 PM Phone: [phone] Fax: [phone]

## 2015-12-29 ENCOUNTER — Ambulatory Visit: Payer: Medicare Other | Admitting: Physical Therapy

## 2015-12-29 DIAGNOSIS — M25662 Stiffness of left knee, not elsewhere classified: Secondary | ICD-10-CM | POA: Diagnosis not present

## 2015-12-29 DIAGNOSIS — M25562 Pain in left knee: Secondary | ICD-10-CM

## 2015-12-29 DIAGNOSIS — R600 Localized edema: Secondary | ICD-10-CM

## 2015-12-29 NOTE — Therapy (Signed)
[place], [place], [address] Phone: [phone]   Fax:  [phone]  Physical Therapy Treatment  Patient Details  Name: Alan Roman MRN: [id number] Date of Birth: 12-08-1945 Referring Provider: Dr. Joni Fears   Encounter Date: 12/29/2015      PT End of Session - 12/29/15 1209    Visit Number 8   Number of Visits 18   Date for PT Re-Evaluation 01/11/16   PT Start Time 5056   PT Stop Time 1240   PT Time Calculation (min) 51 min   Activity Tolerance Patient tolerated treatment well   Behavior During Therapy [facility] for tasks assessed/performed      Past Medical History  Diagnosis Date  . Hypertension   . History of hiatal hernia 2007; 2009  . Arthritis     "knees" (11/03/2015)    Past Surgical History  Procedure Laterality Date  . Video bronchoscopy Bilateral 01/31/2013    Procedure: VIDEO BRONCHOSCOPY WITHOUT FLUORO;  Surgeon: Wilhelmina Mcardle, MD;  Location: [facility];  Service: Cardiopulmonary;  Laterality: Bilateral;  . Total knee arthroplasty Left 11/03/2015  . Joint replacement    . Hiatal hernia repair  2007; 2009  . Hernia repair    . Cataract extraction w/ intraocular lens  implant, bilateral Bilateral   . Total knee arthroplasty Left 11/03/2015    Procedure: Left Total Knee Arthroplasty;  Surgeon: Garald Balding, MD;  Location: [place];  Service: Orthopedics;  Laterality: Left;    There were no vitals filed for this visit.      Subjective Assessment - 12/29/15 1148    Subjective Medial knee pain today, mild after working on NuStep.  Very busy with family in town.  Still wakes with stiffness and pain every night.     Currently in Pain? Yes   Pain Score 2    Pain Location Knee   Pain Orientation Left   Pain Descriptors / Indicators Sore   Pain Type Surgical pain   Pain Onset More than a month ago   Pain Frequency Intermittent             [facility] Adult PT  Treatment/Exercise - 12/29/15 1204    Knee/Hip Exercises: Stretches   Active Hamstring Stretch 3 reps;30 seconds   Knee: Self-Stretch to increase Flexion Left;5 reps;20 seconds   Knee: Self-Stretch Limitations contract relax x 2    Other Knee/Hip Stretches ITB x3, 30 sec LLE   Knee/Hip Exercises: Aerobic   Nustep 13 min prior to PT    Knee/Hip Exercises: Standing   Hip Abduction Stengthening;Both;2 sets;20 reps;Knee straight;Other (comment)   Abduction Limitations for balance  on foam board    Hip Extension Stengthening;Both;1 set;10 reps  on foam    Functional Squat 20 reps   Functional Squat Limitations on foam 2 UE assist   Knee/Hip Exercises: Supine   Straight Leg Raises Strengthening;Left;3 sets   Straight Leg Raises Limitations 2.5 lbs added abd propped on elbows    Straight Leg Raise with External Rotation Strengthening;Left;1 set;20 reps   Straight Leg Raise with External Rotation Limitations 2.5 lbs    Knee/Hip Exercises: Prone   Hamstring Curl 1 set;10 reps   Hamstring Curl Limitations AAROM  followed by prone stretch into flexion    Hip Extension Strengthening;Left;2 sets   Hip Extension Limitations reverse quad set x 10    Cryotherapy   Number Minutes Cryotherapy 8 Minutes   Cryotherapy Location Knee   Type of Cryotherapy  Ice pack   Manual Therapy   Kinesiotex Edema   Kinesiotix   Edema 2 fans for L knee edema                   PT Short Term Goals - 12/29/15 1236    PT SHORT TERM GOAL #1   Title Pt will be I with HEP for knee AROM and strength   Status Achieved   PT SHORT TERM GOAL #2   Title Pt will be able to walk with less limp and proper gait mechanics with min cueing   Status Achieved   PT SHORT TERM GOAL #3   Title Pt will be able to demo no more than -5 deg knee ext for improved gait in L knee.    Status Achieved           PT Long Term Goals - 12/29/15 1236    PT LONG TERM GOAL #1   Title Pt will be I with more advanced HEP for L  knee.    Status On-going   PT LONG TERM GOAL #2   Title Pt will understand RICE and use appropriately to manage edema    Status Achieved   PT LONG TERM GOAL #3   Title Pt will be able to walk in the community as needed without limitation of pain, LRAD.    Status Partially Met   PT LONG TERM GOAL #4   Title Pt will be able to bend L knee to 120 deg or more for improved knee function and transfers.    Status On-going   PT LONG TERM GOAL #5   Title Pt will be able to report rare instance of pain waking him from sleep.    Status On-going               Plan - 12/29/15 1210    Clinical Impression Statement Pt cont with limitations in L knee stiffness, edema and pain which interferes with community mobility (longer distances) and sleep.  He is doing very welll, sees MD on Friday.     PT Next Visit Plan FOTO, balance and strength/ROM, cryo/tape, may consider TM uphill or possibly elliptical    PT Home Exercise Plan knee AAROM, sit to stand, SLR x 4 (stand ext and abd)    Consulted and Agree with Plan of Care Patient      Patient will benefit from skilled therapeutic intervention in order to improve the following deficits and impairments:  Abnormal gait, Decreased range of motion, Difficulty walking, Increased fascial restricitons, Pain, Hypomobility, Impaired flexibility, Postural dysfunction, Increased edema, Decreased strength, Decreased mobility  Visit Diagnosis: Pain in left knee  Stiffness of left knee, not elsewhere classified  Localized edema     Problem List Patient Active Problem List   Diagnosis Date Noted  . Orthostatic hypotension 11/05/2015  . Constipation 11/05/2015  . Thrombocytopenia ([place]) 11/05/2015  . Normocytic anemia 11/05/2015  . Psoriatic arthritis ([place]) 11/03/2015  . Essential hypertension 11/03/2015  . Osteoarthritis of left knee 11/03/2015  . S/P total knee replacement using cement 11/03/2015  . Transaminitis 02/02/2013  . Extrapulmonary TB  (tuberculosis) 02/02/2013  . Pulmonary infiltrate 01/29/2013  . Pleural effusion 01/26/2013  . Acute bronchiolitis due to other infectious organisms 01/26/2013  . Hyponatremia 01/24/2013  . Psoriasis 01/23/2013  . Fever, unknown origin 01/23/2013  . Immunocompromised state ([place]) 01/23/2013    PAA,JENNIFER 12/29/2015, 12:51 PM  [facility] [place],  [place], [address] Phone: [phone]   Fax:  [phone]  Name: Griselda Tosh MRN: [id number] Date of Birth: May 12, 1946    Raeford Razor, PT 12/29/2015 12:51 PM Phone: [phone] Fax: [phone]

## 2015-12-30 ENCOUNTER — Ambulatory Visit: Payer: Medicare Other | Admitting: Physical Therapy

## 2015-12-30 DIAGNOSIS — M25662 Stiffness of left knee, not elsewhere classified: Secondary | ICD-10-CM | POA: Diagnosis not present

## 2015-12-30 DIAGNOSIS — M25562 Pain in left knee: Secondary | ICD-10-CM | POA: Diagnosis not present

## 2015-12-30 DIAGNOSIS — R600 Localized edema: Secondary | ICD-10-CM

## 2015-12-30 NOTE — Therapy (Signed)
[place], [place], [address] Phone: [phone]   Fax:  [phone]  Physical Therapy Treatment  Patient Details  Name: Alan Roman MRN: [id number] Date of Birth: [birth year] Referring Provider: Dr. Joni Fears   Encounter Date: 12/30/2015      PT End of Session - 12/30/15 1218    Visit Number 9   Number of Visits 18   Date for PT Re-Evaluation 01/11/16   PT Start Time 1017   PT Stop Time 1117   PT Time Calculation (min) 60 min   Activity Tolerance Patient tolerated treatment well   Behavior During Therapy [facility] for tasks assessed/performed      Past Medical History  Diagnosis Date  . Hypertension   . History of hiatal hernia 2007; 2009  . Arthritis     "knees" (11/03/2015)    Past Surgical History  Procedure Laterality Date  . Video bronchoscopy Bilateral 01/31/2013    Procedure: VIDEO BRONCHOSCOPY WITHOUT FLUORO;  Surgeon: Wilhelmina Mcardle, MD;  Location[facility] END[facility];  Service: Cardiopulmonary;  Laterality: Bilateral;  . Total knee arthroplasty Left 11/03/2015  . Joint replacement    . Hiatal hernia repair  2007; 2009  . Hernia repair    . Cataract extraction w/ intraocular lens  implant, bilateral Bilateral   . Total knee arthroplasty Left 11/03/2015    Procedure: Left Total Knee Arthroplasty;  Surgeon: Garald Balding, MD;  Location: [place];  Service: Orthopedics;  Laterality: Left;    There were no vitals filed for this visit.      Subjective Assessment - 12/30/15 1020    Subjective Soreness medial kne continues.     Pain Score 2    Pain Location Knee   Pain Orientation Left;Medial   Pain Descriptors / Indicators Sore   Aggravating Factors  longer walking   Pain Relieving Factors ice, heat,  positioning,  rest Aleve                         [facility] Adult PT Treatment/Exercise - 12/30/15 0001    Ambulation/Gait   Ambulation/Gait Yes   Stairs Yes   Gait  Comments Trunk rotation with gait improved with cues.  Able to step over step 4 and 6 inch steps with cues.  .  Patient uses cane only in community, however it makes his Right shoulder sore.   He will bring it in tomorrow to be checked.     Knee/Hip Exercises: Standing   Terminal Knee Extension Limitations Ball at wall, 10 X 5 second holds   Functional Squat 10 reps  hip hinge, cues   Knee/Hip Exercises: Seated   Long Arc Quad 10 reps  with 10 second holds, 5 pounds,  eccentric focus.   Heel Slides 5 reps   Knee/Hip Exercises: Supine   Quad Sets --  Multiple reps, cues   Patellar Mobs instructed patient how   Cryotherapy   Number Minutes Cryotherapy 10 Minutes   Cryotherapy Location Knee   Type of Cryotherapy --  cold pack   Manual Therapy   Manual Therapy Joint mobilization;Soft tissue mobilization;Taping   Manual therapy comments Creating space X pattern with 50% middle third  of tape,  uad activation with "Y"     [place];Facilitate Muscle   Kinesiotix   Edema reinforced   Create Space Medial knee, as above   Facilitate Muscle  Quad  PT Education - 12/30/15 1218    Education provided Yes   Education Details patella mobilization   Person(s) Educated Patient   Methods Explanation;Demonstration   Comprehension Verbalized understanding;Returned demonstration          PT Short Term Goals - 12/29/15 1236    PT SHORT TERM GOAL #1   Title Pt will be I with HEP for knee AROM and strength   Status Achieved   PT SHORT TERM GOAL #2   Title Pt will be able to walk with less limp and proper gait mechanics with min cueing   Status Achieved   PT SHORT TERM GOAL #3   Title Pt will be able to demo no more than -5 deg knee ext for improved gait in L knee.    Status Achieved           PT Long Term Goals - 12/29/15 1236    PT LONG TERM GOAL #1   Title Pt will be I with more advanced HEP for L knee.    Status On-going   PT LONG TERM  GOAL #2   Title Pt will understand RICE and use appropriately to manage edema    Status Achieved   PT LONG TERM GOAL #3   Title Pt will be able to walk in the community as needed without limitation of pain, LRAD.    Status Partially Met   PT LONG TERM GOAL #4   Title Pt will be able to bend L knee to 120 deg or more for improved knee function and transfers.    Status On-going   PT LONG TERM GOAL #5   Title Pt will be able to report rare instance of pain waking him from sleep.    Status On-going               Plan - 12/30/15 1219    Clinical Impression Statement Sore with some exercises.  Terminal knee /strengthening , gait training, now able to walk step over step in clinic.  ROM improves with session both flexion and extension.   PT Next Visit Plan FOTO ,  assess new tape.  ROM/strength,  Elliptical? vs treadmill   PT Home Exercise Plan continue,  patellar mobs as needed.   Consulted and Agree with Plan of Care Patient      Patient will benefit from skilled therapeutic intervention in order to improve the following deficits and impairments:  Abnormal gait, Decreased range of motion, Difficulty walking, Increased fascial restricitons, Pain, Hypomobility, Impaired flexibility, Postural dysfunction, Increased edema, Decreased strength, Decreased mobility  Visit Diagnosis: Pain in left knee  Stiffness of left knee, not elsewhere classified  Localized edema     Problem List Patient Active Problem List   Diagnosis Date Noted  . Orthostatic hypotension 11/05/2015  . Constipation 11/05/2015  . Thrombocytopenia ([place]) 11/05/2015  . Normocytic anemia 11/05/2015  . Psoriatic arthritis ([place]) 11/03/2015  . Essential hypertension 11/03/2015  . Osteoarthritis of left knee 11/03/2015  . S/P total knee replacement using cement 11/03/2015  . Transaminitis 02/02/2013  . Extrapulmonary TB (tuberculosis) 02/02/2013  . Pulmonary infiltrate 01/29/2013  . Pleural effusion 01/26/2013   . Acute bronchiolitis due to other infectious organisms 01/26/2013  . Hyponatremia 01/24/2013  . Psoriasis 01/23/2013  . Fever, unknown origin 01/23/2013  . Immunocompromised state ([place]) 01/23/2013    Helyne Genther 12/30/2015, 12:25 PM  [facility] [address] [place], [place], [address] Phone: [phone]   Fax:  [phone]  Name: Alan Roman MRN: [id number] Date of Birth: [birth year]    Melvenia Needles, PTA 12/30/2015 12:25 PM Phone: [phone] Fax: [phone]

## 2015-12-31 ENCOUNTER — Ambulatory Visit: Payer: Medicare Other | Attending: Orthopaedic Surgery | Admitting: Physical Therapy

## 2015-12-31 DIAGNOSIS — M25662 Stiffness of left knee, not elsewhere classified: Secondary | ICD-10-CM | POA: Insufficient documentation

## 2015-12-31 DIAGNOSIS — R600 Localized edema: Secondary | ICD-10-CM | POA: Diagnosis not present

## 2015-12-31 DIAGNOSIS — M25562 Pain in left knee: Secondary | ICD-10-CM | POA: Insufficient documentation

## 2015-12-31 NOTE — Patient Instructions (Signed)
Knee Flexion: Resisted (Sitting)    Sit with band under left foot and looped around ankle of supported leg. Pull unsupported leg back. HOLD 5 SEC  Repeat __10__ times per set. Do __2__ sets per session. Do __2__ sessions per day.  http://orth.exer.us/694   Copyright  VHI. All rights reserved.

## 2015-12-31 NOTE — Therapy (Signed)
[place], [place], [address] Phone: [phone]   Fax:  [phone]  Physical Therapy Treatment  Patient Details  Name: Alan Roman MRN: [id number] Date of Birth: Dec 11, 1945 Referring Provider: Dr. Joni Fears   Encounter Date: 12/31/2015      PT End of Session - 12/31/15 1624    Visit Number 10   Number of Visits 18   Date for PT Re-Evaluation 01/11/16   PT Start Time 1548   PT Stop Time 1639   PT Time Calculation (min) 51 min   Activity Tolerance Patient tolerated treatment well   Behavior During Therapy [facility] for tasks assessed/performed      Past Medical History  Diagnosis Date  . Hypertension   . History of hiatal hernia 2007; 2009  . Arthritis     "knees" (11/03/2015)    Past Surgical History  Procedure Laterality Date  . Video bronchoscopy Bilateral 01/31/2013    Procedure: VIDEO BRONCHOSCOPY WITHOUT FLUORO;  Surgeon: Wilhelmina Mcardle, MD;  Location: [facility] ENDOSCOPY;  Service: Cardiopulmonary;  Laterality: Bilateral;  . Total knee arthroplasty Left 11/03/2015  . Joint replacement    . Hiatal hernia repair  2007; 2009  . Hernia repair    . Cataract extraction w/ intraocular lens  implant, bilateral Bilateral   . Total knee arthroplasty Left 11/03/2015    Procedure: Left Total Knee Arthroplasty;  Surgeon: Garald Balding, MD;  Location: [place];  Service: Orthopedics;  Laterality: Left;    There were no vitals filed for this visit.      Subjective Assessment - 12/31/15 1551    Subjective My knee feels tight, the tape was too tight. Also had increased pain with descending stairs.    Currently in Pain? Yes   Pain Score 2    Pain Location Knee   Pain Orientation Left;Medial   Pain Descriptors / Indicators Sore   Pain Type Surgical pain   Pain Onset More than a month ago   Pain Frequency Intermittent            [facility] PT Assessment - 12/31/15 1607    Observation/Other Assessments    Focus on Therapeutic Outcomes (FOTO)  34%   Circumferential Edema   Circumferential - Right 13.5    Circumferential - Left  14   AROM   Left Knee Extension -6   Left Knee Flexion 108   PROM   Overall PROM  Other (comment)  L knee flexion to 112 seated   Strength   Left Knee Flexion 4/5   Left Knee Extension 5/5                     [facility] Adult PT Treatment/Exercise - 12/31/15 1558    Knee/Hip Exercises: Aerobic   Nustep 12 min prior to PT    Knee/Hip Exercises: Standing   Heel Raises Both;1 set;20 reps   Terminal Knee Extension Limitations Ball at wall, 10 X 5 second holds   Wall Squat 1 set;15 reps   Other Standing Knee Exercises hip abd wall slide x 10 each LE    Knee/Hip Exercises: Seated   Hamstring Curl Strengthening;1 set;10 reps   Hamstring Limitations blue therabnd    Knee/Hip Exercises: Supine   Bridges Limitations bridge x 10    Single Leg Bridge Strengthening;Both;1 set;10 reps   Cryotherapy   Number Minutes Cryotherapy 10 Minutes   Cryotherapy Location Knee   Type of Cryotherapy Ice pack   Vasopneumatic  Number Minutes Vasopneumatic  15 minutes   Vasopnuematic Location  Knee   Vasopneumatic Pressure Low   Vasopneumatic Temperature  32   Manual Therapy   Joint Mobilization GR II-III flexion/ext mobs supine                 PT Education - Jan 27, 2016 1628    Education provided Yes   Education Details ROM "goal" of 120 deg    Person(s) Educated Patient   Methods Explanation   Comprehension Verbalized understanding          PT Short Term Goals - 12/29/15 1236    PT SHORT TERM GOAL #1   Title Pt will be I with HEP for knee AROM and strength   Status Achieved   PT SHORT TERM GOAL #2   Title Pt will be able to walk with less limp and proper gait mechanics with min cueing   Status Achieved   PT SHORT TERM GOAL #3   Title Pt will be able to demo no more than -5 deg knee ext for improved gait in L knee.    Status Achieved            PT Long Term Goals - 12/29/15 1236    PT LONG TERM GOAL #1   Title Pt will be I with more advanced HEP for L knee.    Status On-going   PT LONG TERM GOAL #2   Title Pt will understand RICE and use appropriately to manage edema    Status Achieved   PT LONG TERM GOAL #3   Title Pt will be able to walk in the community as needed without limitation of pain, LRAD.    Status Partially Met   PT LONG TERM GOAL #4   Title Pt will be able to bend L knee to 120 deg or more for improved knee function and transfers.    Status On-going   PT LONG TERM GOAL #5   Title Pt will be able to report rare instance of pain waking him from sleep.    Status On-going               Plan - 27-Jan-2016 1630    Clinical Impression Statement Patient is doing well overall, AROM improving (-6 to 112 deg). He cont to have mild weakness in L hamstring, some mild gait abnormality and pain with descending stairs.  He will cont to benefit from skilled PT to improve function.     Rehab Potential Excellent   PT Frequency 2x / week   PT Duration 4 weeks   PT Treatment/Interventions ADLs/Self Care Home Management;Ultrasound;Neuromuscular re-education;Passive range of motion;Patient/family education;Gait training;Functional mobility training;Electrical Stimulation;Moist Heat;Therapeutic exercise;Manual techniques;Therapeutic activities;Vasopneumatic Device;Taping   PT Next Visit Plan see what MD says , cont gait, strength and ROM try elliptcal    PT Home Exercise Plan continue,  patellar mobs as needed.   Consulted and Agree with Plan of Care Patient      Patient will benefit from skilled therapeutic intervention in order to improve the following deficits and impairments:  Abnormal gait, Decreased range of motion, Difficulty walking, Increased fascial restricitons, Pain, Hypomobility, Impaired flexibility, Postural dysfunction, Increased edema, Decreased strength, Decreased mobility  Visit Diagnosis: Pain in  left knee  Stiffness of left knee, not elsewhere classified  Localized edema       G-Codes - 01-27-16 1624    Functional Assessment Tool Used FOTO   Functional Limitation Mobility: Walking and moving around   Mobility:  Walking and Moving Around Current Status [phone]) At least 20 percent but less than 40 percent impaired, limited or restricted   Mobility: Walking and Moving Around Goal Status [phone]) At least 20 percent but less than 40 percent impaired, limited or restricted      Problem List Patient Active Problem List   Diagnosis Date Noted  . Orthostatic hypotension 11/05/2015  . Constipation 11/05/2015  . Thrombocytopenia ([place]) 11/05/2015  . Normocytic anemia 11/05/2015  . Psoriatic arthritis ([place]) 11/03/2015  . Essential hypertension 11/03/2015  . Osteoarthritis of left knee 11/03/2015  . S/P total knee replacement using cement 11/03/2015  . Transaminitis 02/02/2013  . Extrapulmonary TB (tuberculosis) 02/02/2013  . Pulmonary infiltrate 01/29/2013  . Pleural effusion 01/26/2013  . Acute bronchiolitis due to other infectious organisms 01/26/2013  . Hyponatremia 01/24/2013  . Psoriasis 01/23/2013  . Fever, unknown origin 01/23/2013  . Immunocompromised state ([place]) 01/23/2013    Wrangler Penning 12/31/2015, 4:40 PM  [facility] [address] [place], [place], [address] Phone: [phone]   Fax:  [phone]  Name: Pearlie Lafosse MRN: [id number] Date of Birth: 1946/04/22    Raeford Razor, PT 12/31/2015 4:40 PM Phone: [phone] Fax: [phone]

## 2016-01-01 ENCOUNTER — Encounter: Payer: Medicare Other | Admitting: Physical Therapy

## 2016-01-01 DIAGNOSIS — M7541 Impingement syndrome of right shoulder: Secondary | ICD-10-CM | POA: Diagnosis not present

## 2016-01-04 ENCOUNTER — Ambulatory Visit: Payer: Medicare Other | Admitting: Physical Therapy

## 2016-01-04 DIAGNOSIS — M25562 Pain in left knee: Secondary | ICD-10-CM | POA: Diagnosis not present

## 2016-01-04 DIAGNOSIS — R600 Localized edema: Secondary | ICD-10-CM

## 2016-01-04 DIAGNOSIS — M25662 Stiffness of left knee, not elsewhere classified: Secondary | ICD-10-CM

## 2016-01-04 NOTE — Therapy (Signed)
[place], [place], [address] Phone: [phone]   Fax:  [phone]  Physical Therapy Treatment  Patient Details  Name: Alan Roman MRN: [id number] Date of Birth: 1946/05/20 Referring Provider: Dr. Joni Fears   Encounter Date: 01/04/2016      PT End of Session - 01/04/16 1220    Visit Number 11   Number of Visits 18   Date for PT Re-Evaluation 01/18/16   PT Start Time 1012   PT Stop Time 1110   PT Time Calculation (min) 58 min   Activity Tolerance Patient tolerated treatment well   Behavior During Therapy [facility] for tasks assessed/performed      Past Medical History  Diagnosis Date  . Hypertension   . History of hiatal hernia 2007; 2009  . Arthritis     "knees" (11/03/2015)    Past Surgical History  Procedure Laterality Date  . Video bronchoscopy Bilateral 01/31/2013    Procedure: VIDEO BRONCHOSCOPY WITHOUT FLUORO;  Surgeon: Wilhelmina Mcardle, MD;  Location[facility] ENDOSCOPY;  Service: Cardiopulmonary;  Laterality: Bilateral;  . Total knee arthroplasty Left 11/03/2015  . Joint replacement    . Hiatal hernia repair  2007; 2009  . Hernia repair    . Cataract extraction w/ intraocular lens  implant, bilateral Bilateral   . Total knee arthroplasty Left 11/03/2015    Procedure: Left Total Knee Arthroplasty;  Surgeon: Garald Balding, MD;  Location: [place];  Service: Orthopedics;  Laterality: Left;    There were no vitals filed for this visit.      Subjective Assessment - 01/04/16 1013    Subjective Went to the MD and he bent my knee to 117 deg! Walked alot yesterday in my neighborhood, min pain lateral in knee.    How long can you walk comfortably? 15 min    Currently in Pain? Yes   Pain Score 1    Pain Location Knee   Pain Orientation Left;Lateral   Pain Descriptors / Indicators Sore   Pain Type Chronic pain;Surgical pain   Pain Onset More than a month ago   Pain Frequency Intermittent    Aggravating Factors  walking >15 min    Pain Relieving Factors rest, ice Ale[place]               [facility] Adult PT Treatment/Exercise - 01/04/16 1024    Knee/Hip Exercises: Stretches   Active Hamstring Stretch Left;3 reps;30 seconds   Active Hamstring Stretch Limitations seated EOB    Other Knee/Hip Stretches ITB x 30 sec x 3 LLE    Knee/Hip Exercises: Standing   Forward Lunges Both;2 sets;10 reps   Forward Lunges Limitations 1 UE assist    Side Lunges Both;1 set;10 reps   Side Lunges Limitations UE assist   Functional Squat 2 sets;15 reps   Functional Squat Limitations on BOSU x 1 and in a "sumo" position x 1   Other Standing Knee Exercises hip hinge x 10 each side  with 2 UE support foe eccentric hamstring strengthening   needed min correction manually   Other Standing Knee Exercises BOSU balance activities: static balance, head movements, march and static SLS with minimal UE assist.  Needs UE assist for eahc exercise but tries to challenge himself    Knee/Hip Exercises: Prone   Hamstring Curl 2 sets;20 reps   Hamstring Curl Limitations blue band    Cryotherapy   Number Minutes Cryotherapy 10 Minutes   Cryotherapy Location Knee   Type of  Cryotherapy Ice pack                PT Education - 01/04/16 1220    Education provided No          PT Short Term Goals - 12/29/15 1236    PT SHORT TERM GOAL #1   Title Pt will be I with HEP for knee AROM and strength   Status Achieved   PT SHORT TERM GOAL #2   Title Pt will be able to walk with less limp and proper gait mechanics with min cueing   Status Achieved   PT SHORT TERM GOAL #3   Title Pt will be able to demo no more than -5 deg knee ext for improved gait in L knee.    Status Achieved           PT Long Term Goals - 12/29/15 1236    PT LONG TERM GOAL #1   Title Pt will be I with more advanced HEP for L knee.    Status On-going   PT LONG TERM GOAL #2   Title Pt will understand RICE and use appropriately to  manage edema    Status Achieved   PT LONG TERM GOAL #3   Title Pt will be able to walk in the community as needed without limitation of pain, LRAD.    Status Partially Met   PT LONG TERM GOAL #4   Title Pt will be able to bend L knee to 120 deg or more for improved knee function and transfers.    Status On-going   PT LONG TERM GOAL #5   Title Pt will be able to report rare instance of pain waking him from sleep.    Status On-going               Plan - 01/04/16 1222    Clinical Impression Statement No increased pain in L kne with closed chain exercises for strengthening and balance.  Min swelling with mild heat in L knee.  Still limited in comfort with extended periods of walking.  No further goals met.    PT Next Visit Plan  cont gait, strength and ROM try elliptcal    PT Home Exercise Plan continue,  patellar mobs as needed.   Consulted and Agree with Plan of Care Patient      Patient will benefit from skilled therapeutic intervention in order to improve the following deficits and impairments:     Visit Diagnosis: Pain in left knee  Stiffness of left knee, not elsewhere classified  Localized edema     Problem List Patient Active Problem List   Diagnosis Date Noted  . Orthostatic hypotension 11/05/2015  . Constipation 11/05/2015  . Thrombocytopenia ([place]) 11/05/2015  . Normocytic anemia 11/05/2015  . Psoriatic arthritis ([place]) 11/03/2015  . Essential hypertension 11/03/2015  . Osteoarthritis of left knee 11/03/2015  . S/P total knee replacement using cement 11/03/2015  . Transaminitis 02/02/2013  . Extrapulmonary TB (tuberculosis) 02/02/2013  . Pulmonary infiltrate 01/29/2013  . Pleural effusion 01/26/2013  . Acute bronchiolitis due to other infectious organisms 01/26/2013  . Hyponatremia 01/24/2013  . Psoriasis 01/23/2013  . Fever, unknown origin 01/23/2013  . Immunocompromised state ([place]) 01/23/2013    Alan Roman 01/04/2016, 12:24 PM  [facility] [address] [place], [place], [address] Phone: [phone]   Fax:  [phone]  Name: Alan Roman MRN: [id number] Date of Birth: October 08, 1945    Raeford Razor, PT 01/04/2016 12:24  PM Phone: [phone] Fax: [phone]

## 2016-01-06 ENCOUNTER — Ambulatory Visit: Payer: Medicare Other | Admitting: Physical Therapy

## 2016-01-06 DIAGNOSIS — M25562 Pain in left knee: Secondary | ICD-10-CM

## 2016-01-06 DIAGNOSIS — R600 Localized edema: Secondary | ICD-10-CM

## 2016-01-06 DIAGNOSIS — M25662 Stiffness of left knee, not elsewhere classified: Secondary | ICD-10-CM | POA: Diagnosis not present

## 2016-01-06 NOTE — Therapy (Signed)
[place], [place], [address] Phone: [phone]   Fax:  [phone]  Physical Therapy Treatment  Patient Details  Name: Alan Roman MRN: [id number] Date of Birth: Jul 04, 1946 Referring Provider: Dr. Joni Fears   Encounter Date: 01/06/2016      PT End of Session - 01/06/16 1032    Visit Number 12   Number of Visits 18   Date for PT Re-Evaluation 02/10/16   PT Start Time 5929   PT Stop Time 1100   PT Time Calculation (min) 45 min   Activity Tolerance Patient tolerated treatment well   Behavior During Therapy [facility] for tasks assessed/performed      Past Medical History  Diagnosis Date  . Hypertension   . History of hiatal hernia 2007; 2009  . Arthritis     "knees" (11/03/2015)    Past Surgical History  Procedure Laterality Date  . Video bronchoscopy Bilateral 01/31/2013    Procedure: VIDEO BRONCHOSCOPY WITHOUT FLUORO;  Surgeon: Wilhelmina Mcardle, MD;  Location: [facility] ENDOSCOPY;  Service: Cardiopulmonary;  Laterality: Bilateral;  . Total knee arthroplasty Left 11/03/2015  . Joint replacement    . Hiatal hernia repair  2007; 2009  . Hernia repair    . Cataract extraction w/ intraocular lens  implant, bilateral Bilateral   . Total knee arthroplasty Left 11/03/2015    Procedure: Left Total Knee Arthroplasty;  Surgeon: Garald Balding, MD;  Location: [place];  Service: Orthopedics;  Laterality: Left;    There were no vitals filed for this visit.      Subjective Assessment - 01/06/16 1016    Subjective Has been working with the blue bands at home.  Very sore but recovers the next day.  Will cont 2 x 3-4 more weeks after this week.    Currently in Pain? No/denies            [facility] PT Assessment - 01/06/16 0001    AROM   Left Knee Extension 5  quad lag            [facility] Adult PT Treatment/Exercise - 01/06/16 1021    Knee/Hip Exercises: Stretches   Active Hamstring Stretch Left;5 reps    Active Hamstring Stretch Limitations various methods, dynamic with towel and also static x 3, contrat relax x 3 with strap   Knee: Self-Stretch to increase Flexion Left;3 reps;30 seconds   Knee: Self-Stretch Limitations to 108 deg   seated to 112 using Rt. LE    Other Knee/Hip Stretches ITB x 30 sec x 3 LLE    Knee/Hip Exercises: Aerobic   Nustep 6 min LE and UE for strengthening, ROM    Knee/Hip Exercises: Standing   Other Standing Knee Exercises Slant board for L gastrocsoleus  3 trials knee straight, 3 x bent for soleus   Knee/Hip Exercises: Supine   Terminal Knee Extension AAROM   Straight Leg Raises Strengthening;Left;2 sets   Straight Leg Raises Limitations 5lb   Straight Leg Raise with External Rotation Strengthening;Left;1 set;2 sets;10 reps   Straight Leg Raise with External Rotation Limitations 5 lbs   Knee/Hip Exercises: Prone   Hip Extension Strengthening;Left;1 set;15 reps   Hip Extension Limitations 2 sets, 1 x knee ext 1x flex   Moist Heat Therapy   Number Minutes Moist Heat 10 Minutes   Moist Heat Location Knee  post   Cryotherapy   Number Minutes Cryotherapy 10 Minutes   Cryotherapy Location Knee   Type of Cryotherapy Ice pack  Manual Therapy   Soft tissue mobilization L ITB and lateral hamsring with mod to deep pressure    Passive ROM extension                 PT Education - 01-14-2016 1031    Education provided Yes   Education Details ROM, change of frequency    Person(s) Educated Patient   Methods Explanation   Comprehension Verbalized understanding          PT Short Term Goals - Jan 14, 2016 1235    PT SHORT TERM GOAL #1   Title Pt will be I with HEP for knee AROM and strength   Status Achieved   PT SHORT TERM GOAL #2   Title Pt will be able to walk with less limp and proper gait mechanics with min cueing   Status Achieved   PT SHORT TERM GOAL #3   Title Pt will be able to demo no more than -5 deg knee ext for improved gait in L knee.     Status Achieved           PT Long Term Goals - 2016/01/14 1235    PT LONG TERM GOAL #1   Title Pt will be I with more advanced HEP for L knee.    Status On-going   PT LONG TERM GOAL #2   Title Pt will understand RICE and use appropriately to manage edema    Status Achieved   PT LONG TERM GOAL #3   Title Pt will be able to walk in the community as needed without limitation of pain, LRAD.    Status Partially Met   PT LONG TERM GOAL #4   Title Pt will be able to bend L knee to 120 deg or more for improved knee function and transfers.    Status On-going   PT LONG TERM GOAL #5   Title Pt will be able to report rare instance of pain waking him from sleep.    Status Partially Met               Plan - 01-14-2016 1056    Clinical Impression Statement Worked on L knee extension today and restrictions in post lateral knee. PRogressing towards goals. He still has some degree of difficulty with stiffness, performing transfers and walking for longer periods of time.    Rehab Potential Excellent   PT Frequency 2x / week   PT Duration 4 weeks   PT Treatment/Interventions ADLs/Self Care Home Management;Ultrasound;Neuromuscular re-education;Passive range of motion;Patient/family education;Gait training;Functional mobility training;Electrical Stimulation;Moist Heat;Therapeutic exercise;Manual techniques;Therapeutic activities;Vasopneumatic Device;Taping   PT Next Visit Plan  cont gait, strength and ROM try elliptcal    PT Home Exercise Plan continue,  patellar mobs as needed.   Consulted and Agree with Plan of Care Patient      Patient will benefit from skilled therapeutic intervention in order to improve the following deficits and impairments:  Abnormal gait, Decreased range of motion, Difficulty walking, Increased fascial restricitons, Pain, Hypomobility, Impaired flexibility, Postural dysfunction, Increased edema, Decreased strength, Decreased mobility  Visit Diagnosis: Pain in left  knee  Stiffness of left knee, not elsewhere classified  Localized edema       G-Codes - 2016-01-14 1235    Functional Assessment Tool Used FOTO/clinical judgement    Functional Limitation Mobility: Walking and moving around   Mobility: Walking and Moving Around Current Status ([id number]) At least 20 percent but less than 40 percent impaired, limited or restricted   Mobility: Walking  and Moving Around Goal Status [phone]) At least 20 percent but less than 40 percent impaired, limited or restricted      Problem List Patient Active Problem List   Diagnosis Date Noted  . Orthostatic hypotension 11/05/2015  . Constipation 11/05/2015  . Thrombocytopenia ([place]) 11/05/2015  . Normocytic anemia 11/05/2015  . Psoriatic arthritis ([place]) 11/03/2015  . Essential hypertension 11/03/2015  . Osteoarthritis of left knee 11/03/2015  . S/P total knee replacement using cement 11/03/2015  . Transaminitis 02/02/2013  . Extrapulmonary TB (tuberculosis) 02/02/2013  . Pulmonary infiltrate 01/29/2013  . Pleural effusion 01/26/2013  . Acute bronchiolitis due to other infectious organisms 01/26/2013  . Hyponatremia 01/24/2013  . Psoriasis 01/23/2013  . Fever, unknown origin 01/23/2013  . Immunocompromised state ([place]) 01/23/2013    Dandrea Medders 01/06/2016, 12:38 PM  [facility] [address] [place], [place], [address] Phone: [phone]   Fax:  [phone]  Name: Alan Roman MRN: [id number] Date of Birth: 1945-09-29    Raeford Razor, PT 01/06/2016 12:38 PM Phone: [phone] Fax: [phone]

## 2016-01-08 ENCOUNTER — Ambulatory Visit: Payer: Medicare Other | Admitting: Physical Therapy

## 2016-01-08 DIAGNOSIS — R600 Localized edema: Secondary | ICD-10-CM | POA: Diagnosis not present

## 2016-01-08 DIAGNOSIS — M25662 Stiffness of left knee, not elsewhere classified: Secondary | ICD-10-CM

## 2016-01-08 DIAGNOSIS — M25562 Pain in left knee: Secondary | ICD-10-CM | POA: Diagnosis not present

## 2016-01-08 NOTE — Therapy (Signed)
[place], [place], [address] Phone: [phone]   Fax:  [phone]  Physical Therapy Treatment  Patient Details  Name: Alan Roman MRN: [id number] Date of Birth: 11-May-1946 Referring Provider: Dr. Joni Fears   Encounter Date: 01/08/2016      PT End of Session - 01/08/16 1025    Visit Number 13   Number of Visits 18   Date for PT Re-Evaluation 02/10/16   PT Start Time 1023  started NuStep early   PT Stop Time 1110   PT Time Calculation (min) 47 min   Activity Tolerance Patient tolerated treatment well   Behavior During Therapy [facility] for tasks assessed/performed      Past Medical History  Diagnosis Date  . Hypertension   . History of hiatal hernia 2007; 2009  . Arthritis     "knees" (11/03/2015)    Past Surgical History  Procedure Laterality Date  . Video bronchoscopy Bilateral 01/31/2013    Procedure: VIDEO BRONCHOSCOPY WITHOUT FLUORO;  Surgeon: Wilhelmina Mcardle, MD;  Location: [facility] ENDOSCOPY;  Service: Cardiopulmonary;  Laterality: Bilateral;  . Total knee arthroplasty Left 11/03/2015  . Joint replacement    . Hiatal hernia repair  2007; 2009  . Hernia repair    . Cataract extraction w/ intraocular lens  implant, bilateral Bilateral   . Total knee arthroplasty Left 11/03/2015    Procedure: Left Total Knee Arthroplasty;  Surgeon: Garald Balding, MD;  Location: [place];  Service: Orthopedics;  Laterality: Left;    There were no vitals filed for this visit.      Subjective Assessment - 01/08/16 1024    Subjective No complaints today.     Currently in Pain? No/denies             [facility] Adult PT Treatment/Exercise - 01/08/16 1027    Lumbar Exercises: Machines for Strengthening   Leg Press 1 plate 2 x 20 bilateral LEs, narrow, wide.  Also 1 plate x 10 single leg (L ) with min A from Rt. LE.  Heel raises and stretching to gastroc x 15 1 plate.    Knee/Hip Exercises: Aerobic   Nustep 12  prior to PT    Knee/Hip Exercises: Machines for Strengthening   Cybex Knee Extension 2 x 10 reps 25 lbs    Cybex Knee Flexion 2 x 10 25Lbs    Knee/Hip Exercises: Standing   SLS blue theraband hip abd x 20   Vasopneumatic   Number Minutes Vasopneumatic  15 minutes   Vasopnuematic Location  Knee   Vasopneumatic Pressure Low   Vasopneumatic Temperature  32   Manual Therapy   Passive ROM L knee and hip                   PT Short Term Goals - 01/06/16 1235    PT SHORT TERM GOAL #1   Title Pt will be I with HEP for knee AROM and strength   Status Achieved   PT SHORT TERM GOAL #2   Title Pt will be able to walk with less limp and proper gait mechanics with min cueing   Status Achieved   PT SHORT TERM GOAL #3   Title Pt will be able to demo no more than -5 deg knee ext for improved gait in L knee.    Status Achieved           PT Long Term Goals - 01/06/16 1235    PT LONG TERM  GOAL #1   Title Pt will be I with more advanced HEP for L knee.    Status On-going   PT LONG TERM GOAL #2   Title Pt will understand RICE and use appropriately to manage edema    Status Achieved   PT LONG TERM GOAL #3   Title Pt will be able to walk in the community as needed without limitation of pain, LRAD.    Status Partially Met   PT LONG TERM GOAL #4   Title Pt will be able to bend L knee to 120 deg or more for improved knee function and transfers.    Status On-going   PT LONG TERM GOAL #5   Title Pt will be able to report rare instance of pain waking him from sleep.    Status Partially Met               Plan - 01/08/16 1055    Clinical Impression Statement Used weight machines (light) today for strengthening.  No pain increase.  Working towards final goals.    PT Next Visit Plan  cont gait, strength and ROM try elliptcal    PT Home Exercise Plan continue,  patellar mobs as needed.   Consulted and Agree with Plan of Care Patient      Patient will benefit from skilled  therapeutic intervention in order to improve the following deficits and impairments:  Abnormal gait, Decreased range of motion, Difficulty walking, Increased fascial restricitons, Pain, Hypomobility, Impaired flexibility, Postural dysfunction, Increased edema, Decreased strength, Decreased mobility  Visit Diagnosis: Pain in left knee  Stiffness of left knee, not elsewhere classified  Localized edema     Problem List Patient Active Problem List   Diagnosis Date Noted  . Orthostatic hypotension 11/05/2015  . Constipation 11/05/2015  . Thrombocytopenia ([place]) 11/05/2015  . Normocytic anemia 11/05/2015  . Psoriatic arthritis ([place]) 11/03/2015  . Essential hypertension 11/03/2015  . Osteoarthritis of left knee 11/03/2015  . S/P total knee replacement using cement 11/03/2015  . Transaminitis 02/02/2013  . Extrapulmonary TB (tuberculosis) 02/02/2013  . Pulmonary infiltrate 01/29/2013  . Pleural effusion 01/26/2013  . Acute bronchiolitis due to other infectious organisms 01/26/2013  . Hyponatremia 01/24/2013  . Psoriasis 01/23/2013  . Fever, unknown origin 01/23/2013  . Immunocompromised state ([place]) 01/23/2013    Alan Roman 01/08/2016, 10:58 AM  [facility] [address] [place], [place], [address] Phone: [phone]   Fax:  [phone]  Name: Alan Roman MRN: [id number] Date of Birth: 07-02-1946   Raeford Razor, PT 01/08/2016 10:58 AM Phone: [phone] Fax: [phone]

## 2016-01-18 ENCOUNTER — Ambulatory Visit: Payer: Medicare Other | Admitting: Physical Therapy

## 2016-01-18 DIAGNOSIS — M25662 Stiffness of left knee, not elsewhere classified: Secondary | ICD-10-CM | POA: Diagnosis not present

## 2016-01-18 DIAGNOSIS — I10 Essential (primary) hypertension: Secondary | ICD-10-CM | POA: Diagnosis not present

## 2016-01-18 DIAGNOSIS — E785 Hyperlipidemia, unspecified: Secondary | ICD-10-CM | POA: Diagnosis not present

## 2016-01-18 DIAGNOSIS — M25562 Pain in left knee: Secondary | ICD-10-CM | POA: Diagnosis not present

## 2016-01-18 DIAGNOSIS — R600 Localized edema: Secondary | ICD-10-CM

## 2016-01-18 DIAGNOSIS — R51 Headache: Secondary | ICD-10-CM | POA: Diagnosis not present

## 2016-01-18 NOTE — Therapy (Signed)
[place], [place], [address] Phone: [phone]   Fax:  [phone]  Physical Therapy Treatment  Patient Details  Name: Alan Roman MRN: [id number] Date of Birth: 1946/08/01 Referring Provider: Dr. Joni Fears   Encounter Date: 01/18/2016      PT End of Session - 01/18/16 1410    Visit Number 14   Number of Visits 18   Date for PT Re-Evaluation 02/10/16   PT Start Time 1330   PT Stop Time 1423   PT Time Calculation (min) 53 min   Activity Tolerance Patient tolerated treatment well   Behavior During Therapy [facility] for tasks assessed/performed      Past Medical History  Diagnosis Date  . Hypertension   . History of hiatal hernia 2007; 2009  . Arthritis     "knees" (11/03/2015)    Past Surgical History  Procedure Laterality Date  . Video bronchoscopy Bilateral 01/31/2013    Procedure: VIDEO BRONCHOSCOPY WITHOUT FLUORO;  Surgeon: Wilhelmina Mcardle, MD;  Location: [facility] ENDOSCOPY;  Service: Cardiopulmonary;  Laterality: Bilateral;  . Total knee arthroplasty Left 11/03/2015  . Joint replacement    . Hiatal hernia repair  2007; 2009  . Hernia repair    . Cataract extraction w/ intraocular lens  implant, bilateral Bilateral   . Total knee arthroplasty Left 11/03/2015    Procedure: Left Total Knee Arthroplasty;  Surgeon: Garald Balding, MD;  Location: [place];  Service: Orthopedics;  Laterality: Left;    There were no vitals filed for this visit.      Subjective Assessment - 01/18/16 1337    Subjective Has not been here in a bit.  Sees cardiologist today , having headaches.  tightness in knee but no pain.    Currently in Pain? No/denies             [facility] Adult PT Treatment/Exercise - 01/18/16 1340    Knee/Hip Exercises: Stretches   Active Hamstring Stretch 5 reps   Knee: Self-Stretch to increase Flexion Left;5 reps   Knee: Self-Stretch Limitations 110 deg   Other Knee/Hip Stretches ITB x 30  sec x 3 LLE    Knee/Hip Exercises: Aerobic   Elliptical level 1 resistance, and level 10 ramp for 5 min    Nustep 8 min UE and LE, level 6 for strength   Knee/Hip Exercises: Standing   Forward Lunges Left;1 set;10 reps   Forward Lunges Limitations for AROM    Hip ADduction Strengthening;Left;1 set;15 reps   Hip ADduction Limitations 5 lbs    Hip Abduction Stengthening;Left;1 set;15 reps   Abduction Limitations 5 lbs   Hip Extension Stengthening;Both;1 set;15 reps;Knee bent   Extension Limitations 5 lbs    Lateral Step Up Left;1 set;20 reps;Hand Hold: 2;Step Height: 8"   Lateral Step Up Limitations very light UE assist   Forward Step Up Left;1 set;20 reps;Hand Hold: 2;Step Height: 8"   Forward Step Up Limitations light UE assist   Knee/Hip Exercises: Supine   Quad Sets Strengthening;Both;1 set   Short Arc Quad Sets AROM;Left;1 set;10 reps   Straight Leg Raise with External Rotation Strengthening;Left;1 set;10 reps   Straight Leg Raise with External Rotation Limitations 5   Cryotherapy   Number Minutes Cryotherapy 10 Minutes   Cryotherapy Location Knee   Type of Cryotherapy Ice pack   Manual Therapy   Soft tissue mobilization L ITB and quads    Passive ROM L knee  PT Short Term Goals - 01/06/16 1235    PT SHORT TERM GOAL #1   Title Pt will be I with HEP for knee AROM and strength   Status Achieved   PT SHORT TERM GOAL #2   Title Pt will be able to walk with less limp and proper gait mechanics with min cueing   Status Achieved   PT SHORT TERM GOAL #3   Title Pt will be able to demo no more than -5 deg knee ext for improved gait in L knee.    Status Achieved           PT Long Term Goals - 01/18/16 1338    PT LONG TERM GOAL #1   Title Pt will be I with more advanced HEP for L knee.    Status On-going   PT LONG TERM GOAL #2   Title Pt will understand RICE and use appropriately to manage edema    Status Achieved   PT LONG TERM GOAL #3    Title Pt will be able to walk in the community as needed without limitation of pain, LRAD.    Status Achieved   PT LONG TERM GOAL #4   Title Pt will be able to bend L knee to 120 deg or more for improved knee function and transfers.    Status On-going   PT LONG TERM GOAL #5   Title Pt will be able to report rare instance of pain waking him from sleep.    Status Partially Met               Plan - 01/18/16 1410    Clinical Impression Statement Patient reports continued progress towards goal of normalized walking.  He tolerated all exercises without increased pain.  TIghtness in L lateral knee addressed prior.  AAROM to 110 deg. supine. Cont to awaken from pain in the night time.    PT Next Visit Plan  cont gait, strength and ROM try elliptcal    PT Home Exercise Plan continue,  patellar mobs as needed.   Consulted and Agree with Plan of Care Patient      Patient will benefit from skilled therapeutic intervention in order to improve the following deficits and impairments:  Abnormal gait, Decreased range of motion, Difficulty walking, Increased fascial restricitons, Pain, Hypomobility, Impaired flexibility, Postural dysfunction, Increased edema, Decreased strength, Decreased mobility  Visit Diagnosis: Pain in left knee  Stiffness of left knee, not elsewhere classified  Localized edema     Problem List Patient Active Problem List   Diagnosis Date Noted  . Orthostatic hypotension 11/05/2015  . Constipation 11/05/2015  . Thrombocytopenia ([place]) 11/05/2015  . Normocytic anemia 11/05/2015  . Psoriatic arthritis ([place]) 11/03/2015  . Essential hypertension 11/03/2015  . Osteoarthritis of left knee 11/03/2015  . S/P total knee replacement using cement 11/03/2015  . Transaminitis 02/02/2013  . Extrapulmonary TB (tuberculosis) 02/02/2013  . Pulmonary infiltrate 01/29/2013  . Pleural effusion 01/26/2013  . Acute bronchiolitis due to other infectious organisms 01/26/2013  .  Hyponatremia 01/24/2013  . Psoriasis 01/23/2013  . Fever, unknown origin 01/23/2013  . Immunocompromised state ([place]) 01/23/2013    PAA,JENNIFER 01/18/2016, 2:24 PM  [facility] [address] [place], [place], [address] Phone: [phone]   Fax:  [phone]  Name: Alan Roman MRN: [id number] Date of Birth: 03-Feb-1946    Raeford Razor, PT 01/18/2016 2:24 PM Phone: [phone] Fax: [phone]

## 2016-01-20 ENCOUNTER — Ambulatory Visit: Payer: Medicare Other | Admitting: Physical Therapy

## 2016-01-20 DIAGNOSIS — M25662 Stiffness of left knee, not elsewhere classified: Secondary | ICD-10-CM | POA: Diagnosis not present

## 2016-01-20 DIAGNOSIS — R600 Localized edema: Secondary | ICD-10-CM

## 2016-01-20 DIAGNOSIS — M25562 Pain in left knee: Secondary | ICD-10-CM | POA: Diagnosis not present

## 2016-01-20 NOTE — Therapy (Signed)
[place], [place], [address] Phone: [phone]   Fax:  [phone]  Physical Therapy Treatment  Patient Details  Name: Alan Roman MRN: [id number] Date of Birth: 1946/01/05 Referring Provider: Dr. Joni Fears   Encounter Date: 01/20/2016      PT End of Session - 01/20/16 1320    Visit Number 15   Date for PT Re-Evaluation 02/10/16   PT Start Time 1100   PT Stop Time 1154   PT Time Calculation (min) 54 min   Activity Tolerance Patient tolerated treatment well   Behavior During Therapy [facility] for tasks assessed/performed      Past Medical History  Diagnosis Date  . Hypertension   . History of hiatal hernia 2007; 2009  . Arthritis     "knees" (11/03/2015)    Past Surgical History  Procedure Laterality Date  . Video bronchoscopy Bilateral 01/31/2013    Procedure: VIDEO BRONCHOSCOPY WITHOUT FLUORO;  Surgeon: Wilhelmina Mcardle, MD;  Location: [facility] ENDOSCOPY;  Service: Cardiopulmonary;  Laterality: Bilateral;  . Total knee arthroplasty Left 11/03/2015  . Joint replacement    . Hiatal hernia repair  2007; 2009  . Hernia repair    . Cataract extraction w/ intraocular lens  implant, bilateral Bilateral   . Total knee arthroplasty Left 11/03/2015    Procedure: Left Total Knee Arthroplasty;  Surgeon: Garald Balding, MD;  Location: [place];  Service: Orthopedics;  Laterality: Left;    There were no vitals filed for this visit.      Subjective Assessment - 01/20/16 1106    Subjective No pain today, my leg felt softer and better after last visit.     Currently in Pain? No/denies      Nustep prior to session, 11 min      [facility] Adult PT Treatment/Exercise - 01/20/16 1115    Knee/Hip Exercises: Stretches   Active Hamstring Stretch Left;2 reps;30 seconds   Quad Stretch Left;1 rep;60 seconds   Piriformis Stretch 2 reps;30 seconds   Piriformis Stretch Limitations figure 4 bilateral   Other Knee/Hip  Stretches ITB x 30 sec x 3 LLE    Knee/Hip Exercises: Standing   Lunge Walking - Round Trips lateral walking, small and large steps, done x10 approx. 7 feet each trip   Knee/Hip Exercises: Seated   Long Arc Quad Left;2 sets;10 reps   Sit to [employer] 1 set;20 reps;without UE support;Other (comment)  ball between knees    Cryotherapy   Number Minutes Cryotherapy 10 Minutes   Cryotherapy Location Knee   Type of Cryotherapy Ice pack   Manual Therapy   Soft tissue mobilization L ITB and quads    Passive ROM L knee                 PT Education - 01/20/16 1320    Education provided Yes   Education Details added to [employer]) Educated Patient   Methods Explanation;Demonstration   Comprehension Verbalized understanding;Returned demonstration          PT Short Term Goals - 01/06/16 1235    PT SHORT TERM GOAL #1   Title Pt will be I with HEP for knee AROM and strength   Status Achieved   PT SHORT TERM GOAL #2   Title Pt will be able to walk with less limp and proper gait mechanics with min cueing   Status Achieved   PT SHORT TERM GOAL #3   Title Pt will be able  to demo no more than -5 deg knee ext for improved gait in L knee.    Status Achieved           PT Long Term Goals - 01/18/16 1338    PT LONG TERM GOAL #1   Title Pt will be I with more advanced HEP for L knee.    Status On-going   PT LONG TERM GOAL #2   Title Pt will understand RICE and use appropriately to manage edema    Status Achieved   PT LONG TERM GOAL #3   Title Pt will be able to walk in the community as needed without limitation of pain, LRAD.    Status Achieved   PT LONG TERM GOAL #4   Title Pt will be able to bend L knee to 120 deg or more for improved knee function and transfers.    Status On-going   PT LONG TERM GOAL #5   Title Pt will be able to report rare instance of pain waking him from sleep.    Status Partially Met               Plan - 01/20/16 1321    Clinical Impression  Statement Patient with AROM of 114 deg today, able to do more advanced standing exercises today with muscle fatigue only, no increase in pain.  Will finish end of month.     PT Next Visit Plan  cont gait, strength and ROM try elliptcal    PT Home Exercise Plan continue,  patellar mobs as needed.   Consulted and Agree with Plan of Care Patient      Patient will benefit from skilled therapeutic intervention in order to improve the following deficits and impairments:  Abnormal gait, Decreased range of motion, Difficulty walking, Increased fascial restricitons, Pain, Hypomobility, Impaired flexibility, Postural dysfunction, Increased edema, Decreased strength, Decreased mobility  Visit Diagnosis: Pain in left knee  Stiffness of left knee, not elsewhere classified  Localized edema     Problem List Patient Active Problem List   Diagnosis Date Noted  . Orthostatic hypotension 11/05/2015  . Constipation 11/05/2015  . Thrombocytopenia ([place]) 11/05/2015  . Normocytic anemia 11/05/2015  . Psoriatic arthritis ([place]) 11/03/2015  . Essential hypertension 11/03/2015  . Osteoarthritis of left knee 11/03/2015  . S/P total knee replacement using cement 11/03/2015  . Transaminitis 02/02/2013  . Extrapulmonary TB (tuberculosis) 02/02/2013  . Pulmonary infiltrate 01/29/2013  . Pleural effusion 01/26/2013  . Acute bronchiolitis due to other infectious organisms 01/26/2013  . Hyponatremia 01/24/2013  . Psoriasis 01/23/2013  . Fever, unknown origin 01/23/2013  . Immunocompromised state ([place]) 01/23/2013    Alan Roman 01/20/2016, 1:24 PM  [place], [place], [address] Phone: [phone]   Fax:  [phone]  Name: Alan Roman MRN: [id number] Date of Birth: [birth year]    Raeford Razor, PT 01/20/2016 1:24 PM Phone: [phone] Fax: [phone]

## 2016-01-25 DIAGNOSIS — M159 Polyosteoarthritis, unspecified: Secondary | ICD-10-CM | POA: Diagnosis not present

## 2016-01-25 DIAGNOSIS — I10 Essential (primary) hypertension: Secondary | ICD-10-CM | POA: Diagnosis not present

## 2016-01-26 ENCOUNTER — Encounter: Payer: Medicare Other | Admitting: Physical Therapy

## 2016-01-27 ENCOUNTER — Encounter: Payer: Self-pay | Admitting: Physical Therapy

## 2016-01-27 ENCOUNTER — Ambulatory Visit: Payer: Medicare Other | Admitting: Physical Therapy

## 2016-01-27 DIAGNOSIS — R600 Localized edema: Secondary | ICD-10-CM

## 2016-01-27 DIAGNOSIS — M25562 Pain in left knee: Secondary | ICD-10-CM

## 2016-01-27 DIAGNOSIS — M25662 Stiffness of left knee, not elsewhere classified: Secondary | ICD-10-CM | POA: Diagnosis not present

## 2016-01-27 NOTE — Therapy (Signed)
[place], [place], [address] Phone: [phone]   Fax:  [phone]  Physical Therapy Treatment  Patient Details  Name: Alan Roman MRN: [id number] Date of Birth: 13-Jun-1946 Referring Provider: Dr. Joni Fears   Encounter Date: 01/27/2016      PT End of Session - 01/27/16 1634    Visit Number 16   Number of Visits 18   Date for PT Re-Evaluation 02/10/16   PT Start Time 1630   PT Stop Time 1721   PT Time Calculation (min) 51 min      Past Medical History  Diagnosis Date  . Hypertension   . History of hiatal hernia 2007; 2009  . Arthritis     "knees" (11/03/2015)    Past Surgical History  Procedure Laterality Date  . Video bronchoscopy Bilateral 01/31/2013    Procedure: VIDEO BRONCHOSCOPY WITHOUT FLUORO;  Surgeon: Wilhelmina Mcardle, MD;  Location: [facility] ENDOSCOPY;  Service: Cardiopulmonary;  Laterality: Bilateral;  . Total knee arthroplasty Left 11/03/2015  . Joint replacement    . Hiatal hernia repair  2007; 2009  . Hernia repair    . Cataract extraction w/ intraocular lens  implant, bilateral Bilateral   . Total knee arthroplasty Left 11/03/2015    Procedure: Left Total Knee Arthroplasty;  Surgeon: Garald Balding, MD;  Location: [place];  Service: Orthopedics;  Laterality: Left;    There were no vitals filed for this visit.      Subjective Assessment - 01/27/16 1631    Subjective pt reports knee is feeling stiff today after being in a car for 4 hours, took a shower and massaged which made it feel better. Pt reports occasional sharp pain in L groin that happens random times but comes and goes quickly.    Patient Stated Goals Get stronger, less stiffness   Currently in Pain? Yes   Pain Score 1    Pain Location Knee   Pain Orientation Left   Pain Descriptors / Indicators --  stiff   Aggravating Factors  stairs, being still for an extended period   Pain Relieving Factors moving, massaging             [facility] PT Assessment - 01/27/16 0001    Strength   Right/Left Hip Right;Left   Right Hip Extension 3-/5   Left Hip Extension 3/5   Left Knee Flexion 4+/5                     [facility] Adult PT Treatment/Exercise - 01/27/16 0001    Knee/Hip Exercises: Stretches   Active Hamstring Stretch Left;2 reps;30 seconds   Quad Stretch Left;2 reps;20 seconds   Piriformis Stretch 2 reps;30 seconds   Gastroc Stretch Both;2 reps;30 seconds   Gastroc Stretch Limitations slant board   Other Knee/Hip Stretches ITB x 30 sec x 3 LLE    Knee/Hip Exercises: Aerobic   Elliptical 5 min  L1, ramp 5   Nustep 8 min L7   Knee/Hip Exercises: Standing   Heel Raises Limitations toe walking 4x49f   Knee Flexion Both;20 reps   Knee Flexion Limitations ball between knees   Lunge Walking - Round Trips lateral steps with lunges x15 each direction   Other Standing Knee Exercises standing toe raises   Knee/Hip Exercises: Seated   Long Arc Quad Limitations 5x 5 leg lifts   Sit to [place] 1 set;20 reps;without UE support;Other (comment)  ball between knees    Knee/Hip Exercises: Prone  Hip Extension Both;20 reps;10 reps   Hip Extension Limitations with knee flexed   Cryotherapy   Number Minutes Cryotherapy 10 Minutes   Cryotherapy Location Knee   Type of Cryotherapy Ice pack                PT Education - 01/27/16 1634    Education provided Yes   Education Details exercise form/rationale   Person(s) Educated Patient   Methods Explanation;Demonstration;Tactile cues;Verbal cues   Comprehension Verbalized understanding;Returned demonstration;Verbal cues required;Tactile cues required;Need further instruction          PT Short Term Goals - 01/06/16 1235    PT SHORT TERM GOAL #1   Title Pt will be I with HEP for knee AROM and strength   Status Achieved   PT SHORT TERM GOAL #2   Title Pt will be able to walk with less limp and proper gait mechanics with min cueing   Status  Achieved   PT SHORT TERM GOAL #3   Title Pt will be able to demo no more than -5 deg knee ext for improved gait in L knee.    Status Achieved           PT Long Term Goals - 01/18/16 1338    PT LONG TERM GOAL #1   Title Pt will be I with more advanced HEP for L knee.    Status On-going   PT LONG TERM GOAL #2   Title Pt will understand RICE and use appropriately to manage edema    Status Achieved   PT LONG TERM GOAL #3   Title Pt will be able to walk in the community as needed without limitation of pain, LRAD.    Status Achieved   PT LONG TERM GOAL #4   Title Pt will be able to bend L knee to 120 deg or more for improved knee function and transfers.    Status On-going   PT LONG TERM GOAL #5   Title Pt will be able to report rare instance of pain waking him from sleep.    Status Partially Met               Plan - 01/27/16 1707    Clinical Impression Statement Added exercises to strengthen gluts and quad endurance today, pt denied any pain but did report fatigue. Was unable to lift RLE off of table into extension in a prone position and L was very difficult. Will continue to benefit from skilled PT to strengthen LE biomechanical chain and establish appropriate HEP to continue strengthening after discharge.    PT Next Visit Plan gait, strength, endurance   PT Home Exercise Plan continue,  patellar mobs as needed.   Consulted and Agree with Plan of Care Patient      Patient will benefit from skilled therapeutic intervention in order to improve the following deficits and impairments:     Visit Diagnosis: Pain in left knee  Stiffness of left knee, not elsewhere classified  Localized edema     Problem List Patient Active Problem List   Diagnosis Date Noted  . Orthostatic hypotension 11/05/2015  . Constipation 11/05/2015  . Thrombocytopenia ([place]) 11/05/2015  . Normocytic anemia 11/05/2015  . Psoriatic arthritis ([place]) 11/03/2015  . Essential hypertension  11/03/2015  . Osteoarthritis of left knee 11/03/2015  . S/P total knee replacement using cement 11/03/2015  . Transaminitis 02/02/2013  . Extrapulmonary TB (tuberculosis) 02/02/2013  . Pulmonary infiltrate 01/29/2013  . Pleural effusion 01/26/2013  .  Acute bronchiolitis due to other infectious organisms 01/26/2013  . Hyponatremia 01/24/2013  . Psoriasis 01/23/2013  . Fever, unknown origin 01/23/2013  . Immunocompromised state ([place]) 01/23/2013    Tavaras Goody C. Kenyan Karnes PT, DPT 01/27/2016 5:13 PM   [place], [place], [address] Phone: [phone]   Fax:  [phone]  Name: Wilgus Deyton MRN: [id number] Date of Birth: 04/14/1946

## 2016-01-28 ENCOUNTER — Ambulatory Visit: Payer: Medicare Other | Admitting: Physical Therapy

## 2016-01-28 DIAGNOSIS — M25662 Stiffness of left knee, not elsewhere classified: Secondary | ICD-10-CM | POA: Diagnosis not present

## 2016-01-28 DIAGNOSIS — R600 Localized edema: Secondary | ICD-10-CM

## 2016-01-28 DIAGNOSIS — M25562 Pain in left knee: Secondary | ICD-10-CM | POA: Diagnosis not present

## 2016-01-28 NOTE — Therapy (Signed)
[place], [place], [address] Phone: [phone]   Fax:  [phone]  Physical Therapy Treatment  Patient Details  Name: Alan Roman MRN: [id number] Date of Birth: March 31, 1946 Referring Provider: Dr. Joni Fears   Encounter Date: 01/28/2016      PT End of Session - 01/28/16 1335    Visit Number 17   Number of Visits 18   Date for PT Re-Evaluation 02/10/16   PT Start Time 1330   PT Stop Time 1420   PT Time Calculation (min) 50 min   Activity Tolerance Patient tolerated treatment well   Behavior During Therapy [facility] for tasks assessed/performed      Past Medical History  Diagnosis Date  . Hypertension   . History of hiatal hernia 2007; 2009  . Arthritis     "knees" (11/03/2015)    Past Surgical History  Procedure Laterality Date  . Video bronchoscopy Bilateral 01/31/2013    Procedure: VIDEO BRONCHOSCOPY WITHOUT FLUORO;  Surgeon: Wilhelmina Mcardle, MD;  Location: [facility] ENDOSCOPY;  Service: Cardiopulmonary;  Laterality: Bilateral;  . Total knee arthroplasty Left 11/03/2015  . Joint replacement    . Hiatal hernia repair  2007; 2009  . Hernia repair    . Cataract extraction w/ intraocular lens  implant, bilateral Bilateral   . Total knee arthroplasty Left 11/03/2015    Procedure: Left Total Knee Arthroplasty;  Surgeon: Garald Balding, MD;  Location: [place];  Service: Orthopedics;  Laterality: Left;    There were no vitals filed for this visit.      Subjective Assessment - 01/28/16 1335    Subjective Knee feels good today, only min stiffness but much better today than yesterday.              [facility] Adult PT Treatment/Exercise - 01/28/16 1338    Knee/Hip Exercises: Aerobic   Nustep 5 min level 4    Knee/Hip Exercises: Machines for Strengthening   Cybex Leg Press 1 plate x 20 reps   2 sets with various positions    Other Machine leg press, calf raise x 20 slow eccentrcic    Knee/Hip  Exercises: Standing   Other Standing Knee Exercises single leg standing work on foam pad (semicircle, extension and static balance)    Other Standing Knee Exercises Scooter (Reformer) 1 [place]  back lunge front lunge x 10 each needed UE assist    Knee/Hip Exercises: Supine   Other Supine Knee/Hip Exercises Pilates Reformer for hip, knee work: sidelying LLE leg press (jumpboard) parallel, hip ER and hip ER on ball of foot    Cryotherapy   Number Minutes Cryotherapy 10 Minutes   Cryotherapy Location Knee   Type of Cryotherapy Ice pack                PT Education - 01/28/16 1407    Education provided Yes   Education Details PIlates Refomer    Person(s) Educated Patient   Methods Explanation;Demonstration   Comprehension Verbalized understanding;Returned demonstration          PT Short Term Goals - 01/28/16 1337    PT SHORT TERM GOAL #1   Title Pt will be I with HEP for knee AROM and strength   Status Achieved   PT SHORT TERM GOAL #2   Title Pt will be able to walk with less limp and proper gait mechanics with min cueing   Status Achieved   PT SHORT TERM GOAL #3   Title  Pt will be able to demo no more than -5 deg knee ext for improved gait in L knee.    Status Achieved           PT Long Term Goals - 01/28/16 1336    PT LONG TERM GOAL #1   Title Pt will be I with more advanced HEP for L knee.    Status On-going   PT LONG TERM GOAL #2   Title Pt will understand RICE and use appropriately to manage edema    Status Achieved   PT LONG TERM GOAL #3   Title Pt will be able to walk in the community as needed without limitation of pain, LRAD.    Status Achieved   PT LONG TERM GOAL #4   Title Pt will be able to bend L knee to 120 deg or more for improved knee function and transfers.    Status On-going   PT LONG TERM GOAL #5   Title Pt will be able to report rare instance of pain waking him from sleep.    Status Achieved               Plan - 01/28/16 1412     Clinical Impression Statement Pt has met 2 more LTG. Knee AAROM today 111 deg flexion.  Hip weakness addressed in standing and in sidelying today.    PT Next Visit Plan gait, strength, endurance: elliptical, manual    PT Home Exercise Plan continue,  patellar mobs as needed.   Consulted and Agree with Plan of Care Patient      Patient will benefit from skilled therapeutic intervention in order to improve the following deficits and impairments:  Abnormal gait, Decreased range of motion, Difficulty walking, Increased fascial restricitons, Pain, Hypomobility, Impaired flexibility, Postural dysfunction, Increased edema, Decreased strength, Decreased mobility  Visit Diagnosis: Pain in left knee  Stiffness of left knee, not elsewhere classified  Localized edema     Problem List Patient Active Problem List   Diagnosis Date Noted  . Orthostatic hypotension 11/05/2015  . Constipation 11/05/2015  . Thrombocytopenia ([place]) 11/05/2015  . Normocytic anemia 11/05/2015  . Psoriatic arthritis ([place]) 11/03/2015  . Essential hypertension 11/03/2015  . Osteoarthritis of left knee 11/03/2015  . S/P total knee replacement using cement 11/03/2015  . Transaminitis 02/02/2013  . Extrapulmonary TB (tuberculosis) 02/02/2013  . Pulmonary infiltrate 01/29/2013  . Pleural effusion 01/26/2013  . Acute bronchiolitis due to other infectious organisms 01/26/2013  . Hyponatremia 01/24/2013  . Psoriasis 01/23/2013  . Fever, unknown origin 01/23/2013  . Immunocompromised state ([place]) 01/23/2013    Alan Roman 01/28/2016, 2:15 PM  [facility] [address] [place], [place], [address] Phone: [phone]   Fax:  [phone]  Name: Alan Roman MRN: [id number] Date of Birth: [birth year]   Raeford Razor, PT 01/28/2016 2:15 PM Phone: [phone] Fax: [phone]

## 2016-02-03 ENCOUNTER — Ambulatory Visit: Payer: Medicare Other | Attending: Orthopaedic Surgery | Admitting: Physical Therapy

## 2016-02-03 DIAGNOSIS — M25562 Pain in left knee: Secondary | ICD-10-CM | POA: Diagnosis not present

## 2016-02-03 DIAGNOSIS — M25662 Stiffness of left knee, not elsewhere classified: Secondary | ICD-10-CM | POA: Diagnosis not present

## 2016-02-03 DIAGNOSIS — R600 Localized edema: Secondary | ICD-10-CM

## 2016-02-03 NOTE — Therapy (Signed)
[facility] [address] [place], [place], [address] Phone: [phone]   Fax:  [phone]  Physical Therapy Treatment  Patient Details  Name: Alan AlbaJagdishcha Roman MRN: [id number] Date of Birth: 15-Sep-1945 Referring Provider: Dr. Norlene CampbellPeter Whitfield   Encounter Date: 02/03/2016      PT End of Session - 02/03/16 1104    Visit Number 18   Date for PT Re-Evaluation 02/10/16   PT Start Time 1103   PT Stop Time 1159   PT Time Calculation (min) 56 min   Activity Tolerance Patient tolerated treatment well   Behavior [facility] for tasks assessed/performed      Past Medical History  Diagnosis Date  . Hypertension   . History of hiatal hernia 2007; 2009  . Arthritis     "knees" (11/03/2015)    Past Surgical History  Procedure Laterality Date  . Video bronchoscopy Bilateral 01/31/2013    Procedure: VIDEO BRONCHOSCOPY WITHOUT FLUORO;  Surgeon: Merwyn Katosavid B Simonds, MD;  [facility];  Service: Cardiopulmonary;  Laterality: Bilateral;  . Total knee arthroplasty Left 11/03/2015  . Joint replacement    . Hiatal hernia repair  2007; 2009  . Hernia repair    . Cataract extraction w/ intraocular lens  implant, bilateral Bilateral   . Total knee arthroplasty Left 11/03/2015    Procedure: Left Total Knee Arthroplasty;  Surgeon: Valeria BatmanPeter W Whitfield, MD;  [facility];  Service: Orthopedics;  Laterality: Left;    There were no vitals filed for this visit.          [facility] PT Assessment - 02/03/16 1134    AROM   Left Knee Flexion 104  108 supine    Strength   Left Knee Flexion 4+/5             [facility] Adult PT Treatment/Exercise - 02/03/16 1105    Knee/Hip Exercises: Stretches   Knee: Self-Stretch to increase Flexion Left;5 reps   Knee: Self-Stretch Limitations 30 sec with strap in prone    Other Knee/Hip Stretches prone quad set x 10   Knee/Hip Exercises: Aerobic   Nustep 8 min LE only level 5    Knee/Hip Exercises:  Machines for Strengthening   Cybex Leg Press 25 lbs LLE x 20, bilateral with hip ER x 20   Omega   Knee/Hip Exercises: Standing   Forward Lunges Both;1 set;15 reps   Forward Lunges Limitations alternating lunge   Step Down Left;1 set;10 reps   Rocker Board 3 minutes   Rocker Board Limitations static and dynamic   SLS with Vectors on BOSU ext and adb x 10 each    Other Standing Knee Exercises FW lunge x 10 on BOSU x 10 each LE    Cryotherapy   Number Minutes Cryotherapy 10 Minutes   Cryotherapy Location Knee   Type of Cryotherapy Ice pack                  PT Short Term Goals - 01/28/16 1337    PT SHORT TERM GOAL #1   Title Pt will be I with HEP for knee AROM and strength   Status Achieved   PT SHORT TERM GOAL #2   Title Pt will be able to walk with less limp and proper gait mechanics with min cueing   Status Achieved   PT SHORT TERM GOAL #3   Title Pt will be able to demo no more than -5 deg knee ext for improved gait in L knee.  Status Achieved           PT Long Term Goals - 01/28/16 1336    PT LONG TERM GOAL #1   Title Pt will be I with more advanced HEP for L knee.    Status On-going   PT LONG TERM GOAL #2   Title Pt will understand RICE and use appropriately to manage edema    Status Achieved   PT LONG TERM GOAL #3   Title Pt will be able to walk in the community as needed without limitation of pain, LRAD.    Status Achieved   PT LONG TERM GOAL #4   Title Pt will be able to bend L knee to 120 deg or more for improved knee function and transfers.    Status On-going   PT LONG TERM GOAL #5   Title Pt will be able to report rare instance of pain waking him from sleep.    Status Achieved               Plan - 02/03/16 1142    Clinical Impression Statement Pt cont to do well, no pain during exercises.  Pt does not tolerate more aggressive manual therapy to L knee for flexion.    PT Next Visit Plan gait, strength, endurance: elliptical, manual     PT Home Exercise Plan continue,  patellar mobs as needed.   Consulted and Agree with Plan of Care Patient      Patient will benefit from skilled therapeutic intervention in order to improve the following deficits and impairments:  Abnormal gait, Decreased range of motion, Difficulty walking, Increased fascial restricitons, Pain, Hypomobility, Impaired flexibility, Postural dysfunction, Increased edema, Decreased strength, Decreased mobility  Visit Diagnosis: Pain in left knee  Stiffness of left knee, not elsewhere classified  Localized edema     Problem List Patient Active Problem List   Diagnosis Date Noted  . Orthostatic hypotension 11/05/2015  . Constipation 11/05/2015  . Thrombocytopenia (HCC) 11/05/2015  . Normocytic anemia 11/05/2015  . Psoriatic arthritis (HCC) 11/03/2015  . Essential hypertension 11/03/2015  . Osteoarthritis of left knee 11/03/2015  . S/P total knee replacement using cement 11/03/2015  . Transaminitis 02/02/2013  . Extrapulmonary TB (tuberculosis) 02/02/2013  . Pulmonary infiltrate 01/29/2013  . Pleural effusion 01/26/2013  . Acute bronchiolitis due to other infectious organisms 01/26/2013  . Hyponatremia 01/24/2013  . Psoriasis 01/23/2013  . Fever, unknown origin 01/23/2013  . Immunocompromised state (HCC) 01/23/2013    Cylah Fannin 02/03/2016, 12:07 PM  [facility] [address] [place], [place], [address] Phone: [phone]   Fax:  [phone]  Name: Alan AlbaJagdishcha Roman MRN: [id number] Date of Birth: June 24, 1946    Karie MainlandJennifer Izetta Sakamoto, PT 02/03/2016 12:07 PM Phone: [phone] Fax: [phone]

## 2016-02-05 ENCOUNTER — Ambulatory Visit: Payer: Medicare Other | Admitting: Physical Therapy

## 2016-02-05 DIAGNOSIS — M25562 Pain in left knee: Secondary | ICD-10-CM

## 2016-02-05 DIAGNOSIS — M25662 Stiffness of left knee, not elsewhere classified: Secondary | ICD-10-CM | POA: Diagnosis not present

## 2016-02-05 DIAGNOSIS — R600 Localized edema: Secondary | ICD-10-CM | POA: Diagnosis not present

## 2016-02-05 NOTE — Therapy (Signed)
[place], [place], [address] Phone: [phone]   Fax:  [phone]  Physical Therapy Treatment/DIscharge  Patient Details  Name: Alan Roman MRN: [id number] Date of Birth: 1946/02/18 Referring Provider: Dr. Joni Fears   Encounter Date: 02/05/2016      PT End of Session - 02/05/16 1139    Visit Number 19   Date for PT Re-Evaluation 02/10/16   PT Start Time 1101   PT Stop Time 1139   PT Time Calculation (min) 38 min   Activity Tolerance Patient tolerated treatment well   Behavior During Therapy [facility] for tasks assessed/performed      Past Medical History  Diagnosis Date  . Hypertension   . History of hiatal hernia 2007; 2009  . Arthritis     "knees" (11/03/2015)    Past Surgical History  Procedure Laterality Date  . Video bronchoscopy Bilateral 01/31/2013    Procedure: VIDEO BRONCHOSCOPY WITHOUT FLUORO;  Surgeon: Wilhelmina Mcardle, MD;  Location: [facility] ENDOSCOPY;  Service: Cardiopulmonary;  Laterality: Bilateral;  . Total knee arthroplasty Left 11/03/2015  . Joint replacement    . Hiatal hernia repair  2007; 2009  . Hernia repair    . Cataract extraction w/ intraocular lens  implant, bilateral Bilateral   . Total knee arthroplasty Left 11/03/2015    Procedure: Left Total Knee Arthroplasty;  Surgeon: Garald Balding, MD;  Location: [place];  Service: Orthopedics;  Laterality: Left;    There were no vitals filed for this visit.      Subjective Assessment - 02/05/16 1107    Subjective No pain today, patient agreeable to DC today.  He has access to a gym where he can continue to exercise and do HEP>    Currently in Pain? No/denies            [facility] PT Assessment - 02/05/16 1140    Observation/Other Assessments   Focus on Therapeutic Outcomes (FOTO)  39%             [facility] Adult PT Treatment/Exercise - 02/05/16 1108    Knee/Hip Exercises: Stretches   Active Hamstring Stretch Left;2  reps;30 seconds   Knee: Self-Stretch to increase Flexion Left;3 reps;30 seconds   Knee: Self-Stretch Limitations 110 deg    Other Knee/Hip Stretches ITB x 30 sec x 3 LLE    Knee/Hip Exercises: Aerobic   Tread Mill 2.0 mph 3 % grade for 5 min    Nustep 8 min LE and UE level 5    Knee/Hip Exercises: Standing   Wall Squat 1 set;10 reps   Other Standing Knee Exercises standing L quad/hip stretch with chair.    Knee/Hip Exercises: Supine   Straight Leg Raises Strengthening;Right;Left;2 sets   Straight Leg Raises Limitations 2.5 lb    Straight Leg Raise with External Rotation Strengthening;Both;1 set;2 sets;10 reps   Straight Leg Raise with External Rotation Limitations 2.5 lbs    Knee/Hip Exercises: Sidelying   Hip ABduction Strengthening;Both;1 set;20 reps   Hip ADduction Strengthening;Left;1 set;10 reps                  PT Short Term Goals - 01/28/16 1337    PT SHORT TERM GOAL #1   Title Pt will be I with HEP for knee AROM and strength   Status Achieved   PT SHORT TERM GOAL #2   Title Pt will be able to walk with less limp and proper gait mechanics with min cueing   Status  Achieved   PT SHORT TERM GOAL #3   Title Pt will be able to demo no more than -5 deg knee ext for improved gait in L knee.    Status Achieved           PT Long Term Goals - 02-12-16 1149    PT LONG TERM GOAL #1   Title Pt will be I with more advanced HEP for L knee.    Status Achieved   PT LONG TERM GOAL #2   Title Pt will understand RICE and use appropriately to manage edema    Status Achieved   PT LONG TERM GOAL #3   Title Pt will be able to walk in the community as needed without limitation of pain, LRAD.    Status Achieved   PT LONG TERM GOAL #4   Title Pt will be able to bend L knee to 120 deg or more for improved knee function and transfers.    Baseline 110 deg   Status Not Met   PT LONG TERM GOAL #5   Status Achieved               Plan - 02/12/2016 1143    Clinical  Impression Statement Pt has met all LTG       Patient will benefit from skilled therapeutic intervention in order to improve the following deficits and impairments:     Visit Diagnosis: Pain in left knee  Stiffness of left knee, not elsewhere classified  Localized edema       G-Codes - Feb 12, 2016 1141    Functional Assessment Tool Used FOTO/clinical judgement    Functional Limitation Mobility: Walking and moving around   Mobility: Walking and Moving Around Current Status [phone]) At least 20 percent but less than 40 percent impaired, limited or restricted   Mobility: Walking and Moving Around Goal Status [phone]) At least 20 percent but less than 40 percent impaired, limited or restricted   Mobility: Walking and Moving Around Discharge Status [phone]) At least 20 percent but less than 40 percent impaired, limited or restricted      Problem List Patient Active Problem List   Diagnosis Date Noted  . Orthostatic hypotension 11/05/2015  . Constipation 11/05/2015  . Thrombocytopenia ([place]) 11/05/2015  . Normocytic anemia 11/05/2015  . Psoriatic arthritis ([place]) 11/03/2015  . Essential hypertension 11/03/2015  . Osteoarthritis of left knee 11/03/2015  . S/P total knee replacement using cement 11/03/2015  . Transaminitis 02/02/2013  . Extrapulmonary TB (tuberculosis) 02/02/2013  . Pulmonary infiltrate 01/29/2013  . Pleural effusion 01/26/2013  . Acute bronchiolitis due to other infectious organisms 01/26/2013  . Hyponatremia 01/24/2013  . Psoriasis 01/23/2013  . Fever, unknown origin 01/23/2013  . Immunocompromised state ([place]) 01/23/2013    PAA,JENNIFER 02/12/2016, 11:51 AM  [facility] [address] [place], [place], [address] Phone: [phone]   Fax:  [phone]  Name: Alan Roman MRN: [id number] Date of Birth: 1946/05/06    Raeford Razor, PT Feb 12, 2016 11:51 AM Phone: [phone] Fax:  [phone]   PHYSICAL THERAPY DISCHARGE SUMMARY  Visits from Start of Care: 19 Current functional level related to goals / functional outcomes: See above, all goals met   Remaining deficits: Knee AROM not limiting function.    Education / Equipment: HEP, posture, RICE  Plan: Patient agrees to discharge.  Patient goals were met. Patient is being discharged due to meeting the stated rehab goals.  ?????    Raeford Razor, PT 02-12-2016 12:35 PM Phone:  [phone] Fax: [phone]  

## 2016-02-08 ENCOUNTER — Ambulatory Visit: Payer: Medicare Other | Admitting: Physical Therapy

## 2016-02-10 ENCOUNTER — Encounter: Payer: Medicare Other | Admitting: Physical Therapy

## 2016-02-25 DIAGNOSIS — I10 Essential (primary) hypertension: Secondary | ICD-10-CM | POA: Diagnosis not present

## 2016-02-25 DIAGNOSIS — M159 Polyosteoarthritis, unspecified: Secondary | ICD-10-CM | POA: Diagnosis not present

## 2016-02-26 DIAGNOSIS — H00025 Hordeolum internum left lower eyelid: Secondary | ICD-10-CM | POA: Diagnosis not present

## 2016-03-22 DIAGNOSIS — M25511 Pain in right shoulder: Secondary | ICD-10-CM | POA: Diagnosis not present

## 2016-03-22 DIAGNOSIS — Z09 Encounter for follow-up examination after completed treatment for conditions other than malignant neoplasm: Secondary | ICD-10-CM | POA: Diagnosis not present

## 2016-03-22 DIAGNOSIS — L408 Other psoriasis: Secondary | ICD-10-CM | POA: Diagnosis not present

## 2016-03-23 ENCOUNTER — Other Ambulatory Visit (HOSPITAL_COMMUNITY): Payer: Self-pay | Admitting: Rheumatology

## 2016-03-23 DIAGNOSIS — G8929 Other chronic pain: Secondary | ICD-10-CM

## 2016-03-23 DIAGNOSIS — M25511 Pain in right shoulder: Principal | ICD-10-CM

## 2016-03-25 DIAGNOSIS — Z0000 Encounter for general adult medical examination without abnormal findings: Secondary | ICD-10-CM | POA: Diagnosis not present

## 2016-03-25 DIAGNOSIS — Z125 Encounter for screening for malignant neoplasm of prostate: Secondary | ICD-10-CM | POA: Diagnosis not present

## 2016-03-25 DIAGNOSIS — Z7189 Other specified counseling: Secondary | ICD-10-CM | POA: Diagnosis not present

## 2016-03-25 DIAGNOSIS — Z79899 Other long term (current) drug therapy: Secondary | ICD-10-CM | POA: Diagnosis not present

## 2016-03-25 DIAGNOSIS — Z299 Encounter for prophylactic measures, unspecified: Secondary | ICD-10-CM | POA: Diagnosis not present

## 2016-03-25 DIAGNOSIS — Z1211 Encounter for screening for malignant neoplasm of colon: Secondary | ICD-10-CM | POA: Diagnosis not present

## 2016-03-25 DIAGNOSIS — Z1389 Encounter for screening for other disorder: Secondary | ICD-10-CM | POA: Diagnosis not present

## 2016-03-25 DIAGNOSIS — R5383 Other fatigue: Secondary | ICD-10-CM | POA: Diagnosis not present

## 2016-03-29 ENCOUNTER — Ambulatory Visit (HOSPITAL_COMMUNITY)
Admission: RE | Admit: 2016-03-29 | Discharge: 2016-03-29 | Disposition: A | Discharge status: Home / Self Care | Payer: Medicare Other | Source: Ambulatory Visit | Attending: Rheumatology | Admitting: Rheumatology

## 2016-03-29 DIAGNOSIS — M7581 Other shoulder lesions, right shoulder: Secondary | ICD-10-CM | POA: Diagnosis not present

## 2016-03-29 DIAGNOSIS — M25511 Pain in right shoulder: Secondary | ICD-10-CM | POA: Diagnosis present

## 2016-03-29 DIAGNOSIS — M25411 Effusion, right shoulder: Secondary | ICD-10-CM | POA: Insufficient documentation

## 2016-03-29 DIAGNOSIS — G8929 Other chronic pain: Secondary | ICD-10-CM

## 2016-03-29 DIAGNOSIS — M19011 Primary osteoarthritis, right shoulder: Secondary | ICD-10-CM | POA: Diagnosis not present

## 2016-04-02 DIAGNOSIS — Z23 Encounter for immunization: Secondary | ICD-10-CM | POA: Diagnosis not present

## 2016-04-07 DIAGNOSIS — M25511 Pain in right shoulder: Secondary | ICD-10-CM | POA: Diagnosis not present

## 2016-05-05 DIAGNOSIS — M7501 Adhesive capsulitis of right shoulder: Secondary | ICD-10-CM | POA: Diagnosis not present

## 2016-05-05 DIAGNOSIS — M19011 Primary osteoarthritis, right shoulder: Secondary | ICD-10-CM | POA: Diagnosis not present

## 2016-05-05 DIAGNOSIS — G8918 Other acute postprocedural pain: Secondary | ICD-10-CM | POA: Diagnosis not present

## 2016-05-05 DIAGNOSIS — M7541 Impingement syndrome of right shoulder: Secondary | ICD-10-CM | POA: Diagnosis not present

## 2016-05-05 DIAGNOSIS — M24111 Other articular cartilage disorders, right shoulder: Secondary | ICD-10-CM | POA: Diagnosis not present

## 2016-05-05 DIAGNOSIS — M94211 Chondromalacia, right shoulder: Secondary | ICD-10-CM | POA: Diagnosis not present

## 2016-05-05 DIAGNOSIS — M75111 Incomplete rotator cuff tear or rupture of right shoulder, not specified as traumatic: Secondary | ICD-10-CM | POA: Diagnosis not present

## 2016-05-11 ENCOUNTER — Inpatient Hospital Stay (INDEPENDENT_AMBULATORY_CARE_PROVIDER_SITE_OTHER): Payer: Medicare Other | Admitting: Orthopedic Surgery

## 2016-05-11 DIAGNOSIS — M7541 Impingement syndrome of right shoulder: Secondary | ICD-10-CM

## 2016-05-25 ENCOUNTER — Telehealth (INDEPENDENT_AMBULATORY_CARE_PROVIDER_SITE_OTHER): Payer: Self-pay | Admitting: Orthopaedic Surgery

## 2016-05-25 NOTE — Telephone Encounter (Signed)
Patient's daughter called about physical therapy for patient's shoulder. She states they were supposed to be receiving a phone call after his last appt. Please call his daughter back or the patient's cell phone.

## 2016-05-25 NOTE — Telephone Encounter (Signed)
Left VM for daughter as to the PT place and phone number to contact.

## 2016-05-27 ENCOUNTER — Telehealth (INDEPENDENT_AMBULATORY_CARE_PROVIDER_SITE_OTHER): Payer: Self-pay

## 2016-05-27 ENCOUNTER — Ambulatory Visit: Payer: Medicare Other | Attending: Orthopaedic Surgery | Admitting: Physical Therapy

## 2016-05-27 DIAGNOSIS — M25611 Stiffness of right shoulder, not elsewhere classified: Secondary | ICD-10-CM | POA: Diagnosis not present

## 2016-05-27 DIAGNOSIS — M7541 Impingement syndrome of right shoulder: Secondary | ICD-10-CM

## 2016-05-27 DIAGNOSIS — M25511 Pain in right shoulder: Secondary | ICD-10-CM | POA: Insufficient documentation

## 2016-05-27 DIAGNOSIS — R293 Abnormal posture: Secondary | ICD-10-CM | POA: Insufficient documentation

## 2016-05-27 DIAGNOSIS — M6281 Muscle weakness (generalized): Secondary | ICD-10-CM | POA: Insufficient documentation

## 2016-05-27 NOTE — Telephone Encounter (Signed)
[facility] on [facility]. [facility]

## 2016-05-27 NOTE — Therapy (Signed)
[facility] [address] [place], [place], [address] Phone: [phone]   Fax:  [phone]  Physical Therapy Evaluation  Patient Details  Name: Alan Roman MRN: [id number] Date of Birth: June 01, 1946 Referring Provider: Dr. Cleophas DunkerWhitfield   Encounter Date: 05/27/2016      PT End of Session - 05/27/16 1031    Visit Number 1   Number of Visits 16   PT Start Time 1016   PT Stop Time 1105   PT Time Calculation (min) 49 min   Activity Tolerance Patient tolerated treatment well   Behavior During Therapy [facility] for tasks assessed/performed      Past Medical History:  Diagnosis Date  . Arthritis    "knees" (11/03/2015)  . History of hiatal hernia 2007; 2009  . Hypertension     Past Surgical History:  Procedure Laterality Date  . CATARACT EXTRACTION W/ INTRAOCULAR LENS  IMPLANT, BILATERAL Bilateral   . HERNIA REPAIR    . HIATAL HERNIA REPAIR  2007; 2009  . JOINT REPLACEMENT    . TOTAL KNEE ARTHROPLASTY Left 11/03/2015  . TOTAL KNEE ARTHROPLASTY Left 11/03/2015   Procedure: Left Total Knee Arthroplasty;  Surgeon: Valeria BatmanPeter W Whitfield, MD;  [facility];  Service: Orthopedics;  Laterality: Left;  Marland Kitchen. VIDEO BRONCHOSCOPY Bilateral 01/31/2013   Procedure: VIDEO BRONCHOSCOPY WITHOUT FLUORO;  Surgeon: Merwyn Katosavid B Simonds, MD;  [facility];  Service: Cardiopulmonary;  Laterality: Bilateral;    There were no vitals filed for this visit.       Subjective Assessment - 05/27/16 1019    Subjective Pt underwent Rt. arthroscopic repair 05/05/16. He has been having severe pain for many months, failed conservative measures. Pt has been doing some gentle ROM exercises.  He cannot sleep on Rt. side, is Rt. hand dominant and has to limit household activties due to pain.    Pertinent History TKA L    Limitations Lifting;Writing;House hold activities   Diagnostic tests MRI   Patient Stated Goals Pain relief, ease of movement.    Currently in  Pain? Yes   Pain Score 7    Pain Location Shoulder   Pain Orientation Right   Pain Descriptors / Indicators Sharp   Pain Type Surgical pain   Pain Radiating Towards prox Rt arm    Pain Onset 1 to 4 weeks ago   Pain Frequency Constant   Aggravating Factors  using Rt. arm    Pain Relieving Factors ice, heat, meds ibuprofen    Effect of Pain on Daily Activities slows him down             [facility] - 05/27/16 1027      Assessment   Medical Diagnosis shoulder scope    Referring Provider Dr. Cleophas DunkerWhitfield    Onset Date/Surgical Date 05/05/16   Hand Dominance Right   Prior Therapy For knee      Precautions   Precautions None     Balance Screen   Has the patient fallen in the past 6 months No   Has the patient had a decrease in activity level because of a fear of falling?  No   Is the patient reluctant to leave their home because of a fear of falling?  No     Prior Function   Level of Independence Independent     Cognition   Overall Cognitive Status Within Functional Limits for tasks assessed     Sensation   Light Touch Appears Intact  Posture/Postural Control   Posture/Postural Control Postural limitations   Postural Limitations Rounded Shoulders;Forward head     AROM   Right Shoulder Flexion 110 Degrees  scaption and compensation to 135 deg   Right Shoulder ABduction 110 Degrees   Right Shoulder Internal Rotation 70 Degrees  FR to upper lumbar    Right Shoulder External Rotation 65 Degrees  FR to T1     Strength   Right Shoulder Flexion 2+/5   Right Shoulder ABduction 3+/5   Right Shoulder Internal Rotation 4/5   Right Shoulder External Rotation 3+/5              [facility] Adult PT Treatment/Exercise - 05/27/16 1027      Shoulder Exercises: Supine   Horizontal ABduction AAROM;Both;10 reps   External Rotation AAROM;10 reps   Flexion AAROM;Both;10 reps   Other Supine Exercises scapular retraction x 10      ICE PACK 10 min post session.             PT Education - 05/27/16 1048    Education provided Yes   Education Details HEP, posture, PT/POC    Person(s) Educated Patient   Methods Explanation;Demonstration;Verbal cues;Handout   Comprehension Returned demonstration;Verbalized understanding;Need further instruction          PT Short Term Goals - 05/27/16 1215      PT SHORT TERM GOAL #1   Title Pt will be I with HEP for Rt. UE  AROM and strength, posture   Time 4     PT SHORT TERM GOAL #2   Title Pt will be able to reach overhead and report minimal pain    Time 4   Period Weeks   Status New     PT SHORT TERM GOAL #3   Title Pt will be able to retrieve mod sized item on high shelf with bilateral UEs and no pain increase    Time 4   Period Weeks   Status New     PT SHORT TERM GOAL #4   Title Pt will be able to sleep on Rt. side for a portion of the night and pain no more than moderate in AM.    Time 4   Period Weeks           PT Long Term Goals - 05/27/16 1217      PT LONG TERM GOAL #1   Title LTG = STG               Plan - 05/27/16 1056    Clinical Impression Statement Pt presents for low complexity eval of Rt. shoulder scope due to tendinopathy of rotator cuff.  [place] of [place] end of [place]  so we will see him until then.  Pt has a considerable amount of pain and lacks sufficient AROM to manage home tasks and complete ADLs.    Rehab Potential Excellent   PT Frequency 3x / week   PT Duration 4 weeks   PT Treatment/Interventions ADLs/Self Care Home Management;Moist Heat;Therapeutic activities;Therapeutic exercise;Ultrasound;Neuromuscular re-education;Manual techniques;Taping;Passive range of motion;Functional mobility training;Electrical Stimulation;Cryotherapy   PT Next Visit Plan Advance AAROM, gentle strength/isometrics    PT Home Exercise Plan supine cane, scapular retraction    Consulted and Agree with Plan of Care Patient      Patient will benefit from  skilled therapeutic intervention in order to improve the following deficits and impairments:  Impaired UE functional use, Pain, Decreased activity tolerance, Decreased strength, Decreased mobility,  Decreased range of motion, Increased fascial restricitons  Visit Diagnosis: Acute pain of right shoulder  Stiffness of right shoulder, not elsewhere classified  Abnormal posture  Muscle weakness (generalized)      G-Codes - 05/31/2016 11-Dec-1217    Functional Assessment Tool Used clinical judgment    Functional Limitation Carrying, moving and handling objects   Carrying, Moving and Handling Objects Current Status [phone]) At least 40 percent but less than 60 percent impaired, limited or restricted   Carrying, Moving and Handling Objects Goal Status ([id number]) At least 20 percent but less than 40 percent impaired, limited or restricted       Problem List Patient Active Problem List   Diagnosis Date Noted  . Orthostatic hypotension 11/05/2015  . Constipation 11/05/2015  . Thrombocytopenia (HCC) 11/05/2015  . Normocytic anemia 11/05/2015  . Psoriatic arthritis (HCC) 11/03/2015  . Essential hypertension 11/03/2015  . Osteoarthritis of left knee 11/03/2015  . S/P total knee replacement using cement 11/03/2015  . Transaminitis 02/02/2013  . Extrapulmonary TB (tuberculosis) 02/02/2013  . Pulmonary infiltrate 01/29/2013  . Pleural effusion 01/26/2013  . Acute bronchiolitis due to other infectious organisms 01/26/2013  . Hyponatremia 01/24/2013  . Psoriasis 01/23/2013  . Fever, unknown origin 01/23/2013  . Immunocompromised state (HCC) 01/23/2013    PAA,JENNIFER May 31, 2016, 12:22 PM  [facility] [address] [place], [place], [address] Phone: [phone]   Fax:  [phone]  Name: Alan Roman MRN: [id number] Date of Birth: [birth year]  Karie Mainland, PT May 31, 2016 12:23 PM Phone: [phone] Fax: [phone]

## 2016-05-27 NOTE — Patient Instructions (Signed)
Scapular Retraction (Standing)    With arms at sides, pinch shoulder blades together. Repeat ___10_ times per set. Do _1___ sets per session. Do __2-3__ sessions per day.  http://orth.exer.us/944   Copyright  VHI. All rights reserved.     SHOULDER: External Rotation - Supine (Cane)   Hold cane with both hands. Rotate arm away from body. Keep elbow on floor and next to body. _10__ reps per set, __2_ sets per day, __5-7_ days per week Add towel to keep elbow at side.  Copyright  VHI. All rights reserved.  Cane Horizontal - Supine   With straight arms holding cane above shoulders, bring cane out to right, center, out to left, and back to above head. Repeat __10_ times. Do _2__ times per day.  Copyright  VHI. All rights reserved.  Cane Exercise: Flexion   Lie on back, holding cane above chest. Keeping arms as straight as possible, lower cane toward floor beyond head. Hold __10-15__ seconds. Repeat _10___ times. Do _2___ sessions per day.  http://gt2.exer.us/91   Copyright  VHI. All rights reserved.

## 2016-05-31 ENCOUNTER — Ambulatory Visit: Payer: Medicare Other | Admitting: Physical Therapy

## 2016-05-31 DIAGNOSIS — R293 Abnormal posture: Secondary | ICD-10-CM

## 2016-05-31 DIAGNOSIS — M25511 Pain in right shoulder: Secondary | ICD-10-CM | POA: Diagnosis not present

## 2016-05-31 DIAGNOSIS — M25611 Stiffness of right shoulder, not elsewhere classified: Secondary | ICD-10-CM

## 2016-05-31 DIAGNOSIS — M6281 Muscle weakness (generalized): Secondary | ICD-10-CM | POA: Diagnosis not present

## 2016-05-31 NOTE — Therapy (Signed)
[facility] [address] [place], [place], [address] Phone: [phone]   Fax:  [phone]  Physical Therapy Treatment  Patient Details  Name: Alan Roman MRN: [id number] Date of Birth: 12/06/1945 Referring Provider: Dr. Whitfield   Encounter Date: 05/31/2016      PT End of Session - 05/31/16 1114    Visit Number 2   Number of Visits 16   PT Start Time 1059   PT Stop Time 1200   PT Time Calculation (min) 61 min   Activity Tolerance Patient tolerated treatment well   Behavior During Therapy WFL for tasks assessed/performed      Past Medical History:  Diagnosis Date  . Arthritis    "knees" (11/03/2015)  . History of hiatal hernia 2007; 2009  . Hypertension     Past Surgical History:  Procedure Laterality Date  . CATARACT EXTRACTION W/ INTRAOCULAR LENS  IMPLANT, BILATERAL Bilateral   . HERNIA REPAIR    . HIATAL HERNIA REPAIR  2007; 2009  . JOINT REPLACEMENT    . TOTAL KNEE ARTHROPLASTY Left 11/03/2015  . TOTAL KNEE ARTHROPLASTY Left 11/03/2015   Procedure: Left Total Knee Arthroplasty;  Surgeon: Peter W Whitfield, MD;  Location: [facility];  Service: Orthopedics;  Laterality: Left;  . VIDEO BRONCHOSCOPY Bilateral 01/31/2013   Procedure: VIDEO BRONCHOSCOPY WITHOUT FLUORO;  Surgeon: David B Simonds, MD;  Location: [facility] ENDOSCOPY;  Service: Cardiopulmonary;  Laterality: Bilateral;    There were no vitals filed for this visit.      Subjective Assessment - 05/31/16 1101    Subjective Pain still uncomfortable in Rt. side of neck.  Did my exercises. Feeling better.     Currently in Pain? Yes   Pain Score 7    Pain Location Shoulder   Pain Orientation Right   Pain Descriptors / Indicators Sore;Tightness   Pain Type Surgical pain   Pain Onset More than a month ago   Pain Frequency Constant   Aggravating Factors  use of Rt. UE    Pain Relieving Factors ice, heat, meds                          [facility] Adult  PT Treatment/Exercise - 05/31/16 1107      Shoulder Exercises: Supine   Horizontal ABduction Strengthening;Both;10 reps   Theraband Level (Shoulder Horizontal ABduction) Level 1 (Yellow)   Horizontal ABduction Weight (lbs) UE Ranger    External Rotation Strengthening;10 reps   Theraband Level (Shoulder External Rotation) Level 1 (Yellow)   Flexion Strengthening;Both;10 reps   Theraband Level (Shoulder Flexion) Level 1 (Yellow)   Other Supine Exercises scapular retraction x 10    Other Supine Exercises supine cane (UE ranger x 10 )      Shoulder Exercises: Pulleys   Flexion 3 minutes   ABduction 2 minutes     Shoulder Exercises: ROM/Strengthening   Pendulum mod cues, 3 min circles and arcs    Other ROM/Strengthening Exercises UE ranger reaching overhead and abd/add     Modalities   Modalities Moist Heat     Moist Heat Therapy   Number Minutes Moist Heat 10 Minutes   Moist Heat Location Shoulder     Manual Therapy   Manual Therapy Soft tissue mobilization;Scapular mobilization;Taping;Other (comment)   Soft tissue mobilization Rt. rhomboid, levator and upper trap very sore with trigger points    Scapular Mobilization sidelying Rt. UE    Passive ROM all planes to tolerance      McConnell 1 strip to Rt. upper trap for inhibition                 PT Education - 05/31/16 2115    Education provided Yes   Education Details HEP confirmation, ROM, tape, trigger points    Person(s) Educated Patient   Methods Explanation;Demonstration   Comprehension Returned demonstration;Verbalized understanding          PT Short Term Goals - 05/31/16 1145      PT SHORT TERM GOAL #1   Title Pt will be I with HEP for Rt. UE  AROM and strength, posture   Status On-going     PT SHORT TERM GOAL #2   Title Pt will be able to reach overhead and report minimal pain    Status On-going     PT SHORT TERM GOAL #3   Title Pt will be able to retrieve mod sized item on high shelf with bilateral  UEs and no pain increase    Status On-going     PT SHORT TERM GOAL #4   Title Pt will be able to sleep on Rt. side for a portion of the night and pain no more than moderate in AM.    Status On-going           PT Long Term Goals - 05/27/16 1217      PT LONG TERM GOAL #1   Title LTG = STG               Plan - 05/31/16 1146    Clinical Impression Statement Patient progressing with AAROM, no goals met, 2nd visit.  Increased tension in periscapular mm due to overfiring with Rt and LT UE reaching.  Trial of tape to inhibit upper trap.    PT Next Visit Plan if ok add in light band ex for ER/ext and give standing flexion, abd? progress as tol, How did tape help?    PT Home Exercise Plan supine cane, scapular retraction    Consulted and Agree with Plan of Care Patient      Patient will benefit from skilled therapeutic intervention in order to improve the following deficits and impairments:  Impaired UE functional use, Pain, Decreased activity tolerance, Decreased strength, Decreased mobility, Decreased range of motion, Increased fascial restricitons  Visit Diagnosis: Acute pain of right shoulder  Stiffness of right shoulder, not elsewhere classified  Abnormal posture  Muscle weakness (generalized)     Problem List Patient Active Problem List   Diagnosis Date Noted  . Orthostatic hypotension 11/05/2015  . Constipation 11/05/2015  . Thrombocytopenia (HCC) 11/05/2015  . Normocytic anemia 11/05/2015  . Psoriatic arthritis (HCC) 11/03/2015  . Essential hypertension 11/03/2015  . Osteoarthritis of left knee 11/03/2015  . S/P total knee replacement using cement 11/03/2015  . Transaminitis 02/02/2013  . Extrapulmonary TB (tuberculosis) 02/02/2013  . Pulmonary infiltrate 01/29/2013  . Pleural effusion 01/26/2013  . Acute bronchiolitis due to other infectious organisms 01/26/2013  . Hyponatremia 01/24/2013  . Psoriasis 01/23/2013  . Fever, unknown origin 01/23/2013  .  Immunocompromised state (HCC) 01/23/2013    Roman,Alan 05/31/2016, 9:16 PM  [facility] [address] [place], [place], [address] Phone: [phone]   Fax:  [phone]  Name: Alan Roman MRN: [id number] Date of Birth: 05/22/1946  Alan Roman, PT 05/31/16 9:16 PM Phone: [phone] Fax: [phone]   

## 2016-06-01 ENCOUNTER — Ambulatory Visit: Payer: Medicare Other | Attending: Orthopaedic Surgery | Admitting: Physical Therapy

## 2016-06-01 DIAGNOSIS — M6281 Muscle weakness (generalized): Secondary | ICD-10-CM | POA: Diagnosis not present

## 2016-06-01 DIAGNOSIS — M25511 Pain in right shoulder: Secondary | ICD-10-CM | POA: Insufficient documentation

## 2016-06-01 DIAGNOSIS — R293 Abnormal posture: Secondary | ICD-10-CM | POA: Diagnosis not present

## 2016-06-01 DIAGNOSIS — M25611 Stiffness of right shoulder, not elsewhere classified: Secondary | ICD-10-CM | POA: Diagnosis not present

## 2016-06-01 NOTE — Therapy (Signed)
[facility] [address] [place], [place], [address] Phone: [phone]   Fax:  [phone]  Physical Therapy Treatment  Patient Details  Name: Alan AlbaJagdishcha Roman MRN: [id number] Date of Birth: 07-21-1946 Referring Provider: Dr. Cleophas DunkerWhitfield   Encounter Date: 06/01/2016      PT End of Session - 06/01/16 1511    Visit Number 3   Number of Visits 16   PT Start Time 1425   PT Stop Time 1515   PT Time Calculation (min) 50 min   Activity Tolerance Patient tolerated treatment well   Behavior During Therapy [facility] for tasks assessed/performed      Past Medical History:  Diagnosis Date  . Arthritis    "knees" (11/03/2015)  . History of hiatal hernia 2007; 2009  . Hypertension     Past Surgical History:  Procedure Laterality Date  . CATARACT EXTRACTION W/ INTRAOCULAR LENS  IMPLANT, BILATERAL Bilateral   . HERNIA REPAIR    . HIATAL HERNIA REPAIR  2007; 2009  . JOINT REPLACEMENT    . TOTAL KNEE ARTHROPLASTY Left 11/03/2015  . TOTAL KNEE ARTHROPLASTY Left 11/03/2015   Procedure: Left Total Knee Arthroplasty;  Surgeon: Valeria BatmanPeter W Whitfield, MD;  [facility];  Service: Orthopedics;  Laterality: Left;  Marland Kitchen. VIDEO BRONCHOSCOPY Bilateral 01/31/2013   Procedure: VIDEO BRONCHOSCOPY WITHOUT FLUORO;  Surgeon: Merwyn Katosavid B Simonds, MD;  [facility];  Service: Cardiopulmonary;  Laterality: Bilateral;    There were no vitals filed for this visit.      Subjective Assessment - 06/01/16 1427    Subjective Less pain today, 5/10.  Tape still on and may have helped a bit.    Currently in Pain? Yes   Pain Score 5    Pain Location Shoulder   Pain Orientation Right   Pain Descriptors / Indicators Tightness   Pain Type Surgical pain   Pain Onset More than a month ago   Pain Frequency Constant            [facility] Adult PT Treatment/Exercise - 06/01/16 1430      Shoulder Exercises: Supine   Horizontal ABduction Strengthening;Both;10 reps   Theraband Level (Shoulder Horizontal ABduction) Level 1 (Yellow)   External Rotation Strengthening;10 reps   Theraband Level (Shoulder External Rotation) Level 1 (Yellow)   Flexion Strengthening;Both;10 reps   Theraband Level (Shoulder Flexion) Level 1 (Yellow)     Shoulder Exercises: Sidelying   External Rotation Strengthening;Right;20 reps;Weights   External Rotation Weight (lbs) 2  with rest breaks    Flexion AAROM;Strengthening;Right;10 reps   ABduction AAROM;Strengthening;Right;10 reps;Weights   Other Sidelying Exercises post capsule stretch Rt. side sleeper    Other Sidelying Exercises manual A for upward scapular motion      Shoulder Exercises: Pulleys   Flexion 2 minutes   ABduction 2 minutes     Shoulder Exercises: ROM/Strengthening   UBE (Upper Arm Bike) 5 min retro, more for posture and AAROM with L UE taking more of the work    Other ROM/Strengthening Exercises post capsule stretch x 10 sec x 10      Moist Heat Therapy   Number Minutes Moist Heat 10 Minutes   Moist Heat Location Shoulder     Manual Therapy   Manual Therapy Joint mobilization   Joint Mobilization GR. 1-2 inferior glide for increasing flexion and abduction    Soft tissue mobilization Rt. teres min, post cuff    Passive ROM all planes  PT Education - 06/01/16 1510    Education provided No   Education Details HEP handout    Person(s) Educated Patient   Methods Explanation;Demonstration;Handout   Comprehension Verbalized understanding;Returned demonstration          PT Short Term Goals - 05/31/16 1145      PT SHORT TERM GOAL #1   Title Pt will be I with HEP for Rt. UE  AROM and strength, posture   Status On-going     PT SHORT TERM GOAL #2   Title Pt will be able to reach overhead and report minimal pain    Status On-going     PT SHORT TERM GOAL #3   Title Pt will be able to retrieve mod sized item on high shelf with bilateral UEs and no pain increase    Status  On-going     PT SHORT TERM GOAL #4   Title Pt will be able to sleep on Rt. side for a portion of the night and pain no more than moderate in AM.    Status On-going           PT Long Term Goals - 05/27/16 1217      PT LONG TERM GOAL #1   Title LTG = STG               Plan - 06/01/16 1512    Clinical Impression Statement Beginning strengthening with light bands.  Multiple trigger points in periscapular mm, progressing well and pain moderate.     PT Next Visit Plan progress ROM, strength, manual for ROM and pain, re-tape    PT Home Exercise Plan supine cane, scapular retraction , ER and horiz abd yellow    Consulted and Agree with Plan of Care Patient      Patient will benefit from skilled therapeutic intervention in order to improve the following deficits and impairments:  Impaired UE functional use, Pain, Decreased activity tolerance, Decreased strength, Decreased mobility, Decreased range of motion, Increased fascial restricitons  Visit Diagnosis: Acute pain of right shoulder  Stiffness of right shoulder, not elsewhere classified  Abnormal posture  Muscle weakness (generalized)     Problem List Patient Active Problem List   Diagnosis Date Noted  . Orthostatic hypotension 11/05/2015  . Constipation 11/05/2015  . Thrombocytopenia (HCC) 11/05/2015  . Normocytic anemia 11/05/2015  . Psoriatic arthritis (HCC) 11/03/2015  . Essential hypertension 11/03/2015  . Osteoarthritis of left knee 11/03/2015  . S/P total knee replacement using cement 11/03/2015  . Transaminitis 02/02/2013  . Extrapulmonary TB (tuberculosis) 02/02/2013  . Pulmonary infiltrate 01/29/2013  . Pleural effusion 01/26/2013  . Acute bronchiolitis due to other infectious organisms 01/26/2013  . Hyponatremia 01/24/2013  . Psoriasis 01/23/2013  . Fever, unknown origin 01/23/2013  . Immunocompromised state (HCC) 01/23/2013    Alan Roman 06/01/2016, 3:18 PM  [facility] [address] [place], [place], [address] Phone: [phone]   Fax:  [phone]  Name: Alan AlbaJagdishcha Roman MRN: [id number] Date of Birth: [birth year]   Karie MainlandJennifer Taji Barretto, PT 06/01/16 3:19 PM Phone: [phone] Fax: [phone]

## 2016-06-01 NOTE — Patient Instructions (Signed)
Resisted Horizontal Abduction: Bilateral    Sit or stand, tubing in both hands, arms out in front. Keeping arms straight, pinch shoulder blades together and stretch arms out. Repeat __20__ times per set. Do __1-2__ sets per session. Do ___1-2_ sessions per day.  http://orth.exer.us/968   Copyright  VHI. All rights reserved.   Resisted External Rotation: in Neutral - Bilateral    Sit or stand, tubing in both hands, elbows at sides, bent to 90, forearms forward. Pinch shoulder blades together and rotate forearms out. Keep elbows at sides. Repeat __10-20__ times per set. Do __2__ sets per session. Do _2___ sessions per day.  http://orth.exer.us/966   Copyright  VHI. All rights reserved.

## 2016-06-06 ENCOUNTER — Ambulatory Visit: Payer: Medicare Other | Admitting: Physical Therapy

## 2016-06-06 DIAGNOSIS — M25511 Pain in right shoulder: Secondary | ICD-10-CM

## 2016-06-06 DIAGNOSIS — M6281 Muscle weakness (generalized): Secondary | ICD-10-CM

## 2016-06-06 DIAGNOSIS — M25611 Stiffness of right shoulder, not elsewhere classified: Secondary | ICD-10-CM

## 2016-06-06 DIAGNOSIS — R293 Abnormal posture: Secondary | ICD-10-CM | POA: Diagnosis not present

## 2016-06-06 NOTE — Therapy (Signed)
Alan Roman, [place], [address] Phone: [phone]   Fax:  [phone]  Physical Therapy Treatment  Patient Details  Name: Alan Roman MRN: [id number] Date of Birth: 1945-12-23 Referring Provider: Dr. Durward Fortes   Encounter Date: 06/06/2016      PT End of Session - 06/06/16 1415    Visit Number 4   Number of Visits 16   PT Start Time 1331   PT Stop Time 1420   PT Time Calculation (min) 49 min   Activity Tolerance Patient limited by lethargy   Behavior During Therapy [facility] for tasks assessed/performed      Past Medical History:  Diagnosis Date  . Arthritis    "knees" (11/03/2015)  . History of hiatal hernia 2007; 2009  . Hypertension     Past Surgical History:  Procedure Laterality Date  . CATARACT EXTRACTION W/ INTRAOCULAR LENS  IMPLANT, BILATERAL Bilateral   . HERNIA REPAIR    . HIATAL HERNIA REPAIR  2007; 2009  . JOINT REPLACEMENT    . TOTAL KNEE ARTHROPLASTY Left 11/03/2015  . TOTAL KNEE ARTHROPLASTY Left 11/03/2015   Procedure: Left Total Knee Arthroplasty;  Surgeon: Garald Balding, MD;  Location: [place];  Service: Orthopedics;  Laterality: Left;  Marland Kitchen VIDEO BRONCHOSCOPY Bilateral 01/31/2013   Procedure: VIDEO BRONCHOSCOPY WITHOUT FLUORO;  Surgeon: Wilhelmina Mcardle, MD;  Location: [facility] ENDOSCOPY;  Service: Cardiopulmonary;  Laterality: Bilateral;    There were no vitals filed for this visit.      Subjective Assessment - 06/06/16 1338    Subjective PT is helping  Pain is 5 - 5.5 / 10.  Yellow band helps shoulder feel better.  He is able to take less pain meds.   Currently in Pain? Yes   Pain Score 5    Pain Location Shoulder   Pain Orientation Right;Posterior   Pain Descriptors / Indicators Sore   Pain Type Surgical pain   Pain Frequency Constant   Aggravating Factors  sleeping on right,  using arm   Pain Relieving Factors ice, heat,  yellow band exercises   Effect of Pain on Daily  Activities pain wakes, extra time with ADL's..  Not lifting            [facility] PT Assessment - 06/06/16 0001      AROM   Right Shoulder Flexion 152 Degrees   Right Shoulder ABduction 145 Degrees   Right Shoulder Internal Rotation 83 Degrees   Right Shoulder External Rotation --  able to reacxh T3                     [facility] Adult PT Treatment/Exercise - 06/06/16 0001      Shoulder Exercises: Supine   Horizontal ABduction Strengthening;Both;10 reps   Theraband Level (Shoulder Horizontal ABduction) Level 1 (Yellow)   Horizontal ABduction Limitations Also with cane 10 X   External Rotation Strengthening   Theraband Level (Shoulder External Rotation) Level 1 (Yellow)  10 x   Other Supine Exercises Sash 5 x yellow band     Shoulder Exercises: Sidelying   External Rotation Strengthening   External Rotation Weight (lbs) 2  10 x with towel roll     Shoulder Exercises: Pulleys   Flexion 2 minutes   ABduction 2 minutes     Shoulder Exercises: ROM/Strengthening   UBE (Upper Arm Bike) 5 min retro, more for posture and AAROM with L UE taking more of the work  Moist Heat Therapy   Number Minutes Moist Heat 10 Minutes   Moist Heat Location Shoulder                  PT Short Term Goals - 06/06/16 1421      PT SHORT TERM GOAL #1   Title Pt will be I with HEP for Rt. UE  AROM and strength, posture   Baseline independent   Time 4   Period Weeks   Status Achieved     PT SHORT TERM GOAL #2   Title Pt will be able to reach overhead and report minimal pain    Baseline 5/10   Time 4   Period Weeks   Status On-going     PT SHORT TERM GOAL #3   Title Pt will be able to retrieve mod sized item on high shelf with bilateral UEs and no pain increase    Baseline 5/10   Time 4   Period Weeks   Status On-going     PT SHORT TERM GOAL #4   Title Pt will be able to sleep on Rt. side for a portion of the night and pain no more than moderate in AM.    Baseline  Unable tosleep on right,  Pain wakes.   Time 4   Period Weeks           PT Long Term Goals - 05/27/16 1217      PT LONG TERM GOAL #1   Title LTG = STG               Plan - 06/06/16 1416    Clinical Impression Statement ROM and strengthening continue to improve with 152 degrees sitting flexion and 145 degrees AROM Abduction. STG#1 met.   PT Treatment/Interventions ADLs/Self Care Home Management;Moist Heat;Therapeutic activities;Therapeutic exercise;Ultrasound;Neuromuscular re-education;Manual techniques;Taping;Passive range of motion;Functional mobility training;Electrical Stimulation;Cryotherapy   PT Next Visit Plan MD note next visit.progress ROM, strength, manual for ROM and pain,    PT Home Exercise Plan supine cane, scapular retraction , ER and horiz abd yellow    Consulted and Agree with Plan of Care Patient      Patient will benefit from skilled therapeutic intervention in order to improve the following deficits and impairments:  Impaired UE functional use, Pain, Decreased activity tolerance, Decreased strength, Decreased mobility, Decreased range of motion, Increased fascial restricitons  Visit Diagnosis: Acute pain of right shoulder  Stiffness of right shoulder, not elsewhere classified  Abnormal posture  Muscle weakness (generalized)     Problem List Patient Active Problem List   Diagnosis Date Noted  . Orthostatic hypotension 11/05/2015  . Constipation 11/05/2015  . Thrombocytopenia ([place]) 11/05/2015  . Normocytic anemia 11/05/2015  . Psoriatic arthritis ([place]) 11/03/2015  . Essential hypertension 11/03/2015  . Osteoarthritis of left knee 11/03/2015  . S/P total knee replacement using cement 11/03/2015  . Transaminitis 02/02/2013  . Extrapulmonary TB (tuberculosis) 02/02/2013  . Pulmonary infiltrate 01/29/2013  . Pleural effusion 01/26/2013  . Acute bronchiolitis due to other infectious organisms 01/26/2013  . Hyponatremia 01/24/2013  . Psoriasis  01/23/2013  . Fever, unknown origin 01/23/2013  . Immunocompromised state ([place]) 01/23/2013    Hawkin Charo PTA 06/06/2016, 2:24 PM  [facility] [address] [place], [place], [address] Phone: [phone]   Fax:  [phone]  Name: Alan Roman MRN: [id number] Date of Birth: [birth year]

## 2016-06-08 ENCOUNTER — Ambulatory Visit: Payer: Medicare Other | Admitting: Physical Therapy

## 2016-06-09 ENCOUNTER — Ambulatory Visit: Payer: Medicare Other | Admitting: Physical Therapy

## 2016-06-09 DIAGNOSIS — M25611 Stiffness of right shoulder, not elsewhere classified: Secondary | ICD-10-CM

## 2016-06-09 DIAGNOSIS — M25511 Pain in right shoulder: Secondary | ICD-10-CM

## 2016-06-09 DIAGNOSIS — R293 Abnormal posture: Secondary | ICD-10-CM

## 2016-06-09 DIAGNOSIS — M6281 Muscle weakness (generalized): Secondary | ICD-10-CM

## 2016-06-09 NOTE — Therapy (Signed)
[place], [place], [address] Phone: [phone]   Fax:  [phone]  Physical Therapy Treatment  Patient Details  Name: Alan Roman MRN: [id number] Date of Birth: 01-Oct-1945 Referring Provider: Dr. Durward Fortes   Encounter Date: 06/09/2016      PT End of Session - 06/09/16 0814    Visit Number 5   Number of Visits 16   PT Start Time 0800   PT Stop Time 4818   PT Time Calculation (min) 55 min      Past Medical History:  Diagnosis Date  . Arthritis    "knees" (11/03/2015)  . History of hiatal hernia 2007; 2009  . Hypertension     Past Surgical History:  Procedure Laterality Date  . CATARACT EXTRACTION W/ INTRAOCULAR LENS  IMPLANT, BILATERAL Bilateral   . HERNIA REPAIR    . HIATAL HERNIA REPAIR  2007; 2009  . JOINT REPLACEMENT    . TOTAL KNEE ARTHROPLASTY Left 11/03/2015  . TOTAL KNEE ARTHROPLASTY Left 11/03/2015   Procedure: Left Total Knee Arthroplasty;  Surgeon: Garald Balding, MD;  Location: [place];  Service: Orthopedics;  Laterality: Left;  Marland Kitchen VIDEO BRONCHOSCOPY Bilateral 01/31/2013   Procedure: VIDEO BRONCHOSCOPY WITHOUT FLUORO;  Surgeon: Wilhelmina Mcardle, MD;  Location: [facility] ENDOSCOPY;  Service: Cardiopulmonary;  Laterality: Bilateral;    There were no vitals filed for this visit.      Subjective Assessment - 06/09/16 0815    Subjective Having some pain on back of my shoulder    Currently in Pain? Yes   Pain Score 3    Pain Location Shoulder   Pain Orientation Right;Posterior   Pain Descriptors / Indicators Sore            [facility] PT Assessment - 06/09/16 0001      AROM   Right Shoulder Flexion 160 Degrees   Right Shoulder ABduction 156 Degrees   Right Shoulder Internal Rotation 83 Degrees   Right Shoulder External Rotation 72 Degrees  able to reacxh T3     Strength   Right Shoulder Flexion 3+/5   Right Shoulder ABduction 3+/5   Right Shoulder Internal Rotation 4/5   Right  Shoulder External Rotation 3+/5                     [facility] Adult PT Treatment/Exercise - 06/09/16 0001      Shoulder Exercises: Sidelying   External Rotation Strengthening   External Rotation Weight (lbs) 2  10 x with towel roll   ABduction AAROM;Strengthening;Right;10 reps;Weights   ABduction Weight (lbs) 1   ABduction Limitations AROMx10  1 # x 10      Shoulder Exercises: Standing   External Rotation Right;15 reps;Theraband   Theraband Level (Shoulder External Rotation) Level 2 (Red)   Internal Rotation 20 reps;Theraband   Theraband Level (Shoulder Internal Rotation) Level 2 (Red)   Extension 15 reps   Theraband Level (Shoulder Extension) Level 2 (Red)   Row 15 reps   Theraband Level (Shoulder Row) Level 2 (Red)     Shoulder Exercises: Pulleys   Flexion 2 minutes   ABduction 2 minutes     Shoulder Exercises: ROM/Strengthening   UBE (Upper Arm Bike) 6 min retro, more for posture and AAROM with L UE taking more of the work      Moist Heat Therapy   Number Minutes Moist Heat 10 Minutes   Moist Heat Location Shoulder  anterioposterior     Manual Therapy  Soft tissue mobilization Rt. teres min, post cuff , rhomboid, medial scap border                  PT Short Term Goals - 06/09/16 0819      PT SHORT TERM GOAL #1   Title Pt will be I with HEP for Rt. UE  AROM and strength, posture   Status Achieved     PT SHORT TERM GOAL #2   Title Pt will be able to reach overhead and report minimal pain    Baseline no pain with AROM   Time 4   Period Weeks   Status Achieved     PT SHORT TERM GOAL #3   Title Pt will be able to retrieve mod sized item on high shelf with bilateral UEs and no pain increase    Baseline 4/10 to retrieve 4# from middle shelf with Bil UE   Time 4   Period Weeks   Status On-going     PT SHORT TERM GOAL #4   Title Pt will be able to sleep on Rt. side for a portion of the night and pain no more than moderate in AM.    Baseline  Unable tosleep on right,  Pain wakes.   Time 4   Period Weeks   Status On-going           PT Long Term Goals - 05/27/16 1217      PT LONG TERM GOAL #1   Title LTG = STG               Plan - 06/09/16 0834    Clinical Impression Statement [name] reports no pain at rest or with AROM. He is unable to retrieve items from overhead shelf without increased pain. His AROM has improved and is painfree reaching overhead without resistance. STG#2 Met.  His strength has slightly improved however he still demonstrates weakness. We focused on strength today and also manual to tender areas in posterior shoulder.    PT Next Visit Plan progress ROM, strength, manual for ROM and pain   PT Home Exercise Plan supine cane, scapular retraction , ER and horiz abd yellow    Consulted and Agree with Plan of Care Patient      Patient will benefit from skilled therapeutic intervention in order to improve the following deficits and impairments:  Impaired UE functional use, Pain, Decreased activity tolerance, Decreased strength, Decreased mobility, Decreased range of motion, Increased fascial restricitons  Visit Diagnosis: Acute pain of right shoulder  Stiffness of right shoulder, not elsewhere classified  Abnormal posture  Muscle weakness (generalized)     Problem List Patient Active Problem List   Diagnosis Date Noted  . Orthostatic hypotension 11/05/2015  . Constipation 11/05/2015  . Thrombocytopenia ([place]) 11/05/2015  . Normocytic anemia 11/05/2015  . Psoriatic arthritis ([place]) 11/03/2015  . Essential hypertension 11/03/2015  . Osteoarthritis of left knee 11/03/2015  . S/P total knee replacement using cement 11/03/2015  . Transaminitis 02/02/2013  . Extrapulmonary TB (tuberculosis) 02/02/2013  . Pulmonary infiltrate 01/29/2013  . Pleural effusion 01/26/2013  . Acute bronchiolitis due to other infectious organisms 01/26/2013  . Hyponatremia 01/24/2013  . Psoriasis 01/23/2013  .  Fever, unknown origin 01/23/2013  . Immunocompromised state ([place]) 01/23/2013    Dorene Ar, PTA 06/09/2016, 10:50 AM  [place], [place], [address] Phone: [phone]   Fax:  [phone]  Name: Alan Roman MRN: [id number] Date  of Birth: [birth year]

## 2016-06-10 ENCOUNTER — Ambulatory Visit: Payer: Medicare Other | Admitting: Physical Therapy

## 2016-06-10 ENCOUNTER — Ambulatory Visit (INDEPENDENT_AMBULATORY_CARE_PROVIDER_SITE_OTHER): Payer: Medicare Other | Admitting: Orthopaedic Surgery

## 2016-06-10 ENCOUNTER — Encounter (INDEPENDENT_AMBULATORY_CARE_PROVIDER_SITE_OTHER): Payer: Self-pay | Admitting: Orthopaedic Surgery

## 2016-06-10 VITALS — BP 126/72 | HR 75 | Resp 12 | Ht 68.0 in | Wt 146.0 lb

## 2016-06-10 DIAGNOSIS — M25511 Pain in right shoulder: Secondary | ICD-10-CM

## 2016-06-10 DIAGNOSIS — M6281 Muscle weakness (generalized): Secondary | ICD-10-CM

## 2016-06-10 DIAGNOSIS — R293 Abnormal posture: Secondary | ICD-10-CM

## 2016-06-10 DIAGNOSIS — M25611 Stiffness of right shoulder, not elsewhere classified: Secondary | ICD-10-CM | POA: Diagnosis not present

## 2016-06-10 DIAGNOSIS — G8929 Other chronic pain: Secondary | ICD-10-CM

## 2016-06-10 MED ORDER — APREMILAST 10 & 20 & 30 MG PO TBPK: ORAL_TABLET | ORAL | 0 refills | Status: DC

## 2016-06-10 NOTE — Progress Notes (Signed)
Patient here to see Dr. Cleophas DunkerWhitfield. Since patient is our mutual patient, and he uses all toes well, we are sharing to sample starter packs of old has low with him today. This was documented in the medication. Knows how to use the medication and he will use it as discussed in past.

## 2016-06-10 NOTE — Progress Notes (Signed)
Office Visit Note   Patient: Alan Roman           Date of Birth: [birth year]           MRN: [id number] Visit Date: 06/10/2016              Requested by: Ignatius Speckinghruv B Vyas, MD [address] [place], [place] [id number] PCP: Ignatius Speckinghruv B Vyas, MD   Assessment & Plan: Visit Diagnoses: No diagnosis found.  Plan: f/u prn. He is doing exceptionally well and is working very hard with his physical therapy continued. He will continue with his home exercise program and continue to follow-up with Dr. Corliss Skainseveshwar and Alan Roman.  Follow-Up Instructions: Return if symptoms worsen or fail to improve.   Orders:  No orders of the defined types were placed in this encounter.  Meds ordered this encounter  Medications  . Apremilast 10 & 20 & 30 MG TBPK    Sig: Take sample starter as discussed.    Dispense:  2 each    Refill:  0    Order Specific Question:   Supervising Provider    Answer:   Pollyann SavoyEVESHWAR, SHAILI [[id number]]      Procedures: No procedures performed   Clinical Data: No additional findings.   Subjective: Chief Complaint  Patient presents with  . Right Shoulder - Follow-up    Pt had Right shoulder SU on 05/05/16. Pt is continuing with PT 3 x week. Pt doing very well.    Review of Systems  Constitutional: Negative.   HENT: Negative.   Eyes: Negative.   Respiratory: Negative.   Cardiovascular: Negative.   Gastrointestinal: Negative.   Endocrine: Negative.   Genitourinary: Negative.   Musculoskeletal: Negative.   Skin: Negative.   Allergic/Immunologic: Negative.   Neurological: Negative.   Hematological: Negative.   Psychiatric/Behavioral: Negative.      Objective: Vital Signs: BP 126/72   Pulse 75   Resp 12   Ht 5\' 8"  (1.727 m)   Wt 146 lb (66.2 kg)   BMI 22.20 kg/m   Physical Exam  Ortho Exam full . range of motion of right shoulder with internal and external rotation flexion and abduction are no localized areas of tenderness neurologically is intact there is no swelling.  all of his incisions are healed nicely.  Specialty Comments:  No specialty comments available.  Imaging: No results found.   PMFS History: Patient Active Problem List   Diagnosis Date Noted  . Orthostatic hypotension 11/05/2015  . Constipation 11/05/2015  . Thrombocytopenia ([facility]) 11/05/2015  . Normocytic anemia 11/05/2015  . Psoriatic arthritis ([facility]) 11/03/2015  . Essential hypertension 11/03/2015  . Osteoarthritis of left knee 11/03/2015  . S/P total knee replacement using cement 11/03/2015  . Transaminitis 02/02/2013  . Extrapulmonary TB (tuberculosis) 02/02/2013  . Pulmonary infiltrate 01/29/2013  . Pleural effusion 01/26/2013  . Acute bronchiolitis due to other infectious organisms 01/26/2013  . Hyponatremia 01/24/2013  . Psoriasis 01/23/2013  . Fever, unknown origin 01/23/2013  . Immunocompromised state ([facility]) 01/23/2013   Past Medical History:  Diagnosis Date  . Arthritis    "knees" (11/03/2015)  . History of hiatal hernia 2007; 2009  . Hypertension     History reviewed. No pertinent family history.  Past Surgical History:  Procedure Laterality Date  . CATARACT EXTRACTION W/ INTRAOCULAR LENS  IMPLANT, BILATERAL Bilateral   . HERNIA REPAIR    . HIATAL HERNIA REPAIR  2007; 2009  . JOINT REPLACEMENT    . TOTAL KNEE ARTHROPLASTY Left 11/03/2015  .  TOTAL KNEE ARTHROPLASTY Left 11/03/2015   Procedure: Left Total Knee Arthroplasty;  Surgeon: Valeria BatmanPeter W Whitfield, MD;  [facility];  Service: Orthopedics;  Laterality: Left;  Marland Kitchen. VIDEO BRONCHOSCOPY Bilateral 01/31/2013   Procedure: VIDEO BRONCHOSCOPY WITHOUT FLUORO;  Surgeon: Merwyn Katosavid B Simonds, MD;  [facility];  Service: Cardiopulmonary;  Laterality: Bilateral;   Social History   Occupational History  . Not on file.   Social History Main Topics  . Smoking status: Never Smoker  . Smokeless tobacco: Never Used  . Alcohol use No  . Drug use: No  . Sexual activity: Not on file

## 2016-06-10 NOTE — Therapy (Addendum)
[place], [place], [address] Phone: [phone]   Fax:  [phone]  Physical Therapy Treatment/Discharge  Patient Details  Name: Alan Roman MRN: [id number] Date of Birth: 07-Jul-1946 Referring Provider: Dr. Durward Fortes   Encounter Date: 06/10/2016      PT End of Session - 06/10/16 1224    Visit Number 6   Number of Visits 16   PT Start Time 1607   PT Stop Time 1222   PT Time Calculation (min) 39 min      Past Medical History:  Diagnosis Date  . Arthritis    "knees" (11/03/2015)  . History of hiatal hernia 2007; 2009  . Hypertension     Past Surgical History:  Procedure Laterality Date  . CATARACT EXTRACTION W/ INTRAOCULAR LENS  IMPLANT, BILATERAL Bilateral   . HERNIA REPAIR    . HIATAL HERNIA REPAIR  2007; 2009  . JOINT REPLACEMENT    . TOTAL KNEE ARTHROPLASTY Left 11/03/2015  . TOTAL KNEE ARTHROPLASTY Left 11/03/2015   Procedure: Left Total Knee Arthroplasty;  Surgeon: Garald Balding, MD;  Location: [place];  Service: Orthopedics;  Laterality: Left;  Marland Kitchen VIDEO BRONCHOSCOPY Bilateral 01/31/2013   Procedure: VIDEO BRONCHOSCOPY WITHOUT FLUORO;  Surgeon: Wilhelmina Mcardle, MD;  Location: [facility] ENDOSCOPY;  Service: Cardiopulmonary;  Laterality: Bilateral;    There were no vitals filed for this visit.      Subjective Assessment - 06/10/16 1231    Subjective Same pain in back of shouder but doing good.    Currently in Pain? Yes   Pain Score 3    Pain Location Shoulder   Pain Orientation Posterior;Right            [facility] PT Assessment - 06/10/16 0001      Observation/Other Assessments   Focus on Therapeutic Outcomes (FOTO)  NT      AROM   Right Shoulder Flexion 160 Degrees   Right Shoulder ABduction 156 Degrees   Right Shoulder Internal Rotation 83 Degrees   Right Shoulder External Rotation 72 Degrees  able to reacxh T3     Strength   Right Shoulder Flexion 3+/5   Right Shoulder  ABduction 3+/5   Right Shoulder Internal Rotation 4/5   Right Shoulder External Rotation 3+/5                     [facility] Adult PT Treatment/Exercise - 06/10/16 0001      Shoulder Exercises: Standing   Horizontal ABduction Both;15 reps;Theraband   Theraband Level (Shoulder Horizontal ABduction) Level 1 (Yellow)   External Rotation Right;15 reps;Theraband   Theraband Level (Shoulder External Rotation) Level 2 (Red)   Internal Rotation 20 reps;Theraband   Theraband Level (Shoulder Internal Rotation) Level 2 (Red)   Flexion Right;10 reps;Weights  2 sets   Shoulder Flexion Weight (lbs) 1   ABduction Right;10 reps;Weights  2 sets   Shoulder ABduction Weight (lbs) 1   Extension 15 reps   Theraband Level (Shoulder Extension) Level 2 (Red)   Row 15 reps   Theraband Level (Shoulder Row) Level 2 (Red)     Shoulder Exercises: Pulleys   Flexion 2 minutes   ABduction 2 minutes     Shoulder Exercises: ROM/Strengthening   UBE (Upper Arm Bike) 6 min retro, more for posture and AAROM with L UE taking more of the work    Other ROM/Strengthening Exercises Wall slides for flexiona and Abduction stretching  G-Codes - 06/10/16 1600    Functional Assessment Tool Used clinical judgment    Functional Limitation Carrying, moving and handling objects   Carrying, Moving and Handling Objects Current Status [phone]) At least 20 percent but less than 40 percent impaired, limited or restricted   Carrying, Moving and Handling Objects Goal Status (K3838) At least 20 percent but less than 40 percent impaired, limited or restricted   Carrying, Moving and Handling Objects Discharge Status [phone]) At least 20 percent but less than 40 percent impaired, limited or restricted           PT Education - 06/10/16 1232    Education provided Yes   Education Details HEP   Person(s) Educated Patient   Methods Explanation   Comprehension Verbalized understanding          PT  Short Term Goals - 06/10/16 1230      PT SHORT TERM GOAL #1   Title Pt will be I with HEP for Rt. UE  AROM and strength, posture   Status Achieved     PT SHORT TERM GOAL #2   Title Pt will be able to reach overhead and report minimal pain    Status Achieved     PT SHORT TERM GOAL #3   Title Pt will be able to retrieve mod sized item on high shelf with bilateral UEs and no pain increase    Baseline 4/10 to retrieve 4# from middle shelf with Bil UE   Time 4   Period Weeks   Status Not Met     PT SHORT TERM GOAL #4   Title Pt will be able to sleep on Rt. side for a portion of the night and pain no more than moderate in AM.    Baseline Unable tosleep on right,  Pain wakes.- MD said 4-5 more weeks for him before pain will decrease   Time 4   Period Weeks   Status Not Met           PT Long Term Goals - 05/27/16 1217      PT LONG TERM GOAL #1   Title LTG = STG               Plan - 06/10/16 1224    Clinical Impression Statement Pt reports MD is very pleased with his progress and told him he could discontinue PT. Pt asked for comprehensive HEP to continue shoulder strengthening. He plans to have right knee replacement in a couple of months. Instructed pt in standing strengthening with 1# weights verses yellow band. Also progressed to red band for scap strength. Pt given progressive stronger therabands to use as he gets stronger. Educated pt to stop if any exercise becomes painful. He verbalized understanding.    PT Next Visit Plan discharge today   PT Home Exercise Plan supine cane, scapular retraction , ER and horiz abd yellow    Consulted and Agree with Plan of Care Patient      Patient will benefit from skilled therapeutic intervention in order to improve the following deficits and impairments:  Impaired UE functional use, Pain, Decreased activity tolerance, Decreased strength, Decreased mobility, Decreased range of motion, Increased fascial restricitons  Visit  Diagnosis: Acute pain of right shoulder  Stiffness of right shoulder, not elsewhere classified  Abnormal posture  Muscle weakness (generalized)     Problem List Patient Active Problem List   Diagnosis Date Noted  . Orthostatic hypotension 11/05/2015  . Constipation 11/05/2015  . Thrombocytopenia (  [place]) 11/05/2015  . Normocytic anemia 11/05/2015  . Psoriatic arthritis ([place]) 11/03/2015  . Essential hypertension 11/03/2015  . Osteoarthritis of left knee 11/03/2015  . S/P total knee replacement using cement 11/03/2015  . Transaminitis 02/02/2013  . Extrapulmonary TB (tuberculosis) 02/02/2013  . Pulmonary infiltrate 01/29/2013  . Pleural effusion 01/26/2013  . Acute bronchiolitis due to other infectious organisms 01/26/2013  . Hyponatremia 01/24/2013  . Psoriasis 01/23/2013  . Fever, unknown origin 01/23/2013  . Immunocompromised state ([place]) 01/23/2013    Dorene Ar, PTA 06/10/2016, 12:37 PM  [place], [place], [address] Phone: [phone]   Fax:  [phone]  Name: Alan Roman MRN: [id number] Date of Birth: 1946/01/15   PHYSICAL THERAPY DISCHARGE SUMMARY  Visits from Start of Care: 6  Current functional level related to goals / functional outcomes: See above for goals, strength and AROM , pain  Pt overall doing quite well.     Remaining deficits: Pain with sleeping on Rt side, weakness, end range AROM   Education / Equipment: HEP, RICE , ex progression  Plan: Patient agrees to discharge.  Patient goals were partially met. Patient is being discharged due to being pleased with the current functional level.  ?????    MD recommended to DC PT and save visits for Rt. TKR.  Raeford Razor, PT 06/10/16 4:02 PM Phone: [phone] Fax: [phone]

## 2016-06-10 NOTE — Patient Instructions (Addendum)
EXTENSION: Standing - Resistance Band: Stable (Active)   Stand, right arm at side. Against yellow resistance band, draw arm backward, as far as possible, keeping elbow straight. Complete __2_ sets of 10___ repetitions. Perform _2__ sessions per day.    Resistive Band Rowing   With resistive band anchored in door, grasp both ends. Keeping elbows bent, pull back, squeezing shoulder blades together. Hold __5__ seconds. Repeat _10___ times. Do __2__ sessions per day.

## 2016-06-13 ENCOUNTER — Ambulatory Visit: Payer: Medicare Other | Admitting: Physical Therapy

## 2016-06-15 ENCOUNTER — Ambulatory Visit: Payer: Medicare Other | Admitting: Physical Therapy

## 2016-06-17 ENCOUNTER — Encounter: Payer: Medicare Other | Admitting: Physical Therapy

## 2016-06-17 DIAGNOSIS — I10 Essential (primary) hypertension: Secondary | ICD-10-CM | POA: Diagnosis not present

## 2016-06-17 DIAGNOSIS — M159 Polyosteoarthritis, unspecified: Secondary | ICD-10-CM | POA: Diagnosis not present

## 2016-06-20 ENCOUNTER — Encounter: Payer: Medicare Other | Admitting: Physical Therapy

## 2016-06-20 ENCOUNTER — Telehealth (INDEPENDENT_AMBULATORY_CARE_PROVIDER_SITE_OTHER): Payer: Self-pay | Admitting: Orthopaedic Surgery

## 2016-06-20 NOTE — Telephone Encounter (Signed)
Patients daughter called and said that her father had a TKA (can't remember which side) nut now needs the other TKA.  She wants to know if he needs to see Dr. Cleophas DunkerWhitfield again or if we can just schedule the surgery.  Her name is Ilene QuaKirty and her phone is [phone].

## 2016-06-21 DIAGNOSIS — J069 Acute upper respiratory infection, unspecified: Secondary | ICD-10-CM | POA: Diagnosis not present

## 2016-06-22 ENCOUNTER — Encounter: Payer: Medicare Other | Admitting: Physical Therapy

## 2016-06-22 NOTE — Telephone Encounter (Signed)
Needs follow-up appointment. Last for that knee was needing an MRI scan which he did not complete.

## 2016-06-27 ENCOUNTER — Encounter: Payer: Medicare Other | Admitting: Physical Therapy

## 2016-06-28 NOTE — Telephone Encounter (Signed)
See note from AltonBrian.

## 2016-06-28 NOTE — Telephone Encounter (Signed)
It doesn't show on EPIC that he had an MRI to be scheduled.

## 2016-06-29 NOTE — Telephone Encounter (Signed)
Is this ok to schedule?

## 2016-06-29 NOTE — Telephone Encounter (Signed)
Need to see in office

## 2016-06-29 NOTE — Telephone Encounter (Signed)
Call and set up appt to see Dr Cleophas DunkerWhitfield to discuss surgical options

## 2016-06-30 NOTE — Telephone Encounter (Signed)
Spoke with pt, he is in [place] and will keep ion touch.

## 2016-06-30 NOTE — Telephone Encounter (Signed)
What are we scheduling?

## 2016-06-30 NOTE — Telephone Encounter (Signed)
Pt had multiple complaints. He is in [place] and will not be back for a while.

## 2016-10-03 ENCOUNTER — Ambulatory Visit (INDEPENDENT_AMBULATORY_CARE_PROVIDER_SITE_OTHER): Payer: Medicare Other | Admitting: Orthopaedic Surgery

## 2016-10-10 ENCOUNTER — Ambulatory Visit (INDEPENDENT_AMBULATORY_CARE_PROVIDER_SITE_OTHER): Payer: Medicare Other | Admitting: Orthopaedic Surgery

## 2016-10-10 ENCOUNTER — Ambulatory Visit (INDEPENDENT_AMBULATORY_CARE_PROVIDER_SITE_OTHER): Payer: Medicare Other

## 2016-10-10 DIAGNOSIS — M25561 Pain in right knee: Secondary | ICD-10-CM | POA: Diagnosis not present

## 2016-10-10 DIAGNOSIS — G8929 Other chronic pain: Secondary | ICD-10-CM

## 2016-10-10 MED ORDER — METHYLPREDNISOLONE ACETATE 40 MG/ML IJ SUSP
80.0000 mg | INTRAMUSCULAR | Status: AC | PRN
  Administered 2016-10-10: 80 mg

## 2016-10-10 MED ORDER — BUPIVACAINE HCL 0.5 % IJ SOLN
3.0000 mL | INTRAMUSCULAR | Status: AC | PRN
  Administered 2016-10-10: 3 mL via INTRA_ARTICULAR

## 2016-10-10 MED ORDER — LIDOCAINE HCL 1 % IJ SOLN
5.0000 mL | INTRAMUSCULAR | Status: AC | PRN
  Administered 2016-10-10: 5 mL

## 2016-10-10 NOTE — Progress Notes (Signed)
Office Visit Note   Patient: Alan Roman           Date of Birth: 1946-02-16           MRN: [id number] Visit Date: 10/10/2016              Requested by: Alan Specking, MD [address] [place], [place] [address] PCP: Alan Specking, MD   Assessment & Plan: Visit Diagnoses: Osteoarthritis right knee   Plan: Cortisone injection and follow up as needed . Discussion regarding pathology noted by x-ray  Follow-Up Instructions: No Follow-up on file.   Orders:  No orders of the defined types were placed in this encounter.  No orders of the defined types were placed in this encounter.     Procedures: Large Joint Inj Date/Time: 10/10/2016 10:36 AM Performed by: Alan Roman Authorized by: Alan Roman   Consent Given by:  Patient Timeout: prior to procedure the correct patient, procedure, and site was verified   Indications:  Pain and joint swelling Location:  Knee Site:  R knee Prep: patient was prepped and draped in usual sterile fashion   Needle Size:  25 G Needle Length:  1.5 inches Approach:  Anteromedial Ultrasound Guidance: No   Fluoroscopic Guidance: No   Arthrogram: No   Medications:  5 mL lidocaine 1 %; 80 mg methylPREDNISolone acetate 40 MG/ML; 3 mL bupivacaine 0.5 % Aspiration Attempted: No   Patient tolerance:  Patient tolerated the procedure well with no immediate complications     Clinical Data: No additional findings.   Subjective: No chief complaint on file.   Alan Roman is a 71 year male here today for Right knee pain for months. He does not take any medications for the pain.sometimes he hears a "click."  Successful left total knee replacement without problem and also had successful rotator cuff tear repair right shoulder. Right knee does not swell but is "achy" no history of injury or trauma  Review of Systems   Objective: Vital Signs: There were no vitals taken for this visit.  Physical Exam  Ortho Exam knee with full extension.  Predominantly medial joint pain without popping or clicking. Flexed over 110 without instability. No effusion. No pain with range of motion of either hip. No calf pain. Neurovascular exam intact distally.  Specialty Comments:  No specialty comments available.  Imaging: No results found.   PMFS History: Patient Active Problem List   Diagnosis Date Noted  . Orthostatic hypotension 11/05/2015  . Constipation 11/05/2015  . Thrombocytopenia (HCC) 11/05/2015  . Normocytic anemia 11/05/2015  . Psoriatic arthritis (HCC) 11/03/2015  . Essential hypertension 11/03/2015  . Osteoarthritis of left knee 11/03/2015  . S/P total knee replacement using cement 11/03/2015  . Transaminitis 02/02/2013  . Extrapulmonary TB (tuberculosis) 02/02/2013  . Pulmonary infiltrate 01/29/2013  . Pleural effusion 01/26/2013  . Acute bronchiolitis due to other infectious organisms 01/26/2013  . Hyponatremia 01/24/2013  . Psoriasis 01/23/2013  . Fever, unknown origin 01/23/2013  . Immunocompromised state (HCC) 01/23/2013   Past Medical History:  Diagnosis Date  . Arthritis    "knees" (11/03/2015)  . History of hiatal hernia 2007; 2009  . Hypertension     No family history on file.  Past Surgical History:  Procedure Laterality Date  . CATARACT EXTRACTION W/ INTRAOCULAR LENS  IMPLANT, BILATERAL Bilateral   . HERNIA REPAIR    . HIATAL HERNIA REPAIR  2007; 2009  . JOINT REPLACEMENT    . TOTAL KNEE ARTHROPLASTY  Left 11/03/2015  . TOTAL KNEE ARTHROPLASTY Left 11/03/2015   Procedure: Left Total Knee Arthroplasty;  Surgeon: Alan BatmanPeter W Aiesha Leland, MD;  [facility];  Service: Orthopedics;  Laterality: Left;  Marland Kitchen. VIDEO BRONCHOSCOPY Bilateral 01/31/2013   Procedure: VIDEO BRONCHOSCOPY WITHOUT FLUORO;  Surgeon: Alan Katosavid B Simonds, MD;  [facility] END[facility];  Service: Cardiopulmonary;  Laterality: Bilateral;   Social History   Occupational History  . Not on file.   Social History Main Topics  . Smoking status: Never  Smoker  . Smokeless tobacco: Never Used  . Alcohol use No  . Drug use: No  . Sexual activity: Not on file

## 2016-11-07 DIAGNOSIS — I10 Essential (primary) hypertension: Secondary | ICD-10-CM | POA: Diagnosis not present

## 2016-11-07 DIAGNOSIS — M159 Polyosteoarthritis, unspecified: Secondary | ICD-10-CM | POA: Diagnosis not present

## 2016-11-17 DIAGNOSIS — Z961 Presence of intraocular lens: Secondary | ICD-10-CM | POA: Diagnosis not present

## 2016-11-17 DIAGNOSIS — H04123 Dry eye syndrome of bilateral lacrimal glands: Secondary | ICD-10-CM | POA: Diagnosis not present

## 2016-11-30 DIAGNOSIS — I10 Essential (primary) hypertension: Secondary | ICD-10-CM | POA: Diagnosis not present

## 2016-11-30 DIAGNOSIS — M159 Polyosteoarthritis, unspecified: Secondary | ICD-10-CM | POA: Diagnosis not present

## 2016-12-07 DIAGNOSIS — E785 Hyperlipidemia, unspecified: Secondary | ICD-10-CM | POA: Diagnosis not present

## 2016-12-07 DIAGNOSIS — I10 Essential (primary) hypertension: Secondary | ICD-10-CM | POA: Diagnosis not present

## 2016-12-08 DIAGNOSIS — E785 Hyperlipidemia, unspecified: Secondary | ICD-10-CM | POA: Diagnosis not present

## 2016-12-08 DIAGNOSIS — I10 Essential (primary) hypertension: Secondary | ICD-10-CM | POA: Diagnosis not present

## 2016-12-27 ENCOUNTER — Ambulatory Visit (INDEPENDENT_AMBULATORY_CARE_PROVIDER_SITE_OTHER): Payer: Medicare Other | Admitting: Rheumatology

## 2016-12-27 ENCOUNTER — Encounter: Payer: Self-pay | Admitting: Rheumatology

## 2016-12-27 VITALS — BP 108/60 | HR 64 | Resp 14 | Wt 156.0 lb

## 2016-12-27 DIAGNOSIS — M79672 Pain in left foot: Secondary | ICD-10-CM

## 2016-12-27 DIAGNOSIS — L4050 Arthropathic psoriasis, unspecified: Secondary | ICD-10-CM

## 2016-12-27 DIAGNOSIS — M79642 Pain in left hand: Secondary | ICD-10-CM

## 2016-12-27 DIAGNOSIS — L409 Psoriasis, unspecified: Secondary | ICD-10-CM

## 2016-12-27 DIAGNOSIS — Z79899 Other long term (current) drug therapy: Secondary | ICD-10-CM

## 2016-12-27 HISTORY — DX: Other long term (current) drug therapy: Z79.899

## 2016-12-27 MED ORDER — APREMILAST 30 MG PO TABS: 30.0000 mg | ORAL_TABLET | Freq: Two times a day (BID) | ORAL | 2 refills | Status: DC

## 2016-12-27 NOTE — Progress Notes (Signed)
Pharmacy Note  Subjective: Patient presents today to the [facility] to see Dr. Corliss Skainseveshwar.  Patient seen by the pharmacist for counseling on subcutaneous methotrexate.    Objective: 12/09/16 CMP normal CBC ordered today TB Gold: history of TB which has been treated Hepatitis panel: ordered today HIV: negative (01/29/2013)  Chest-xray:  11/04/15 "IMPRESSION: 1.  Low lung volumes with mild basilar atelectasis. 2. Cardiomegaly.  No evidence CHF.  Exam stable from 10/28/2015."  Contraception: N/A, patient does not plan to have any children.   Alcohol use: none  Assessment/Plan:  Patient was initiated on methotrexate subcutaneous injections.  Patient was counseled on the purpose, proper use, and adverse effects of methotrexate including nausea, infection, and signs and symptoms of pneumonitis.  Reviewed instructions with patient to take methotrexate weekly along with folic acid daily.  Discussed the importance of frequent monitoring of kidney and liver function and blood counts, and provided patient with standing lab instructions.  Counseled patient to avoid NSAIDs and alcohol while on methotrexate.  Provided patient with educational materials on methotrexate and answered all questions.  Advised patient to get annual influenza vaccine and to get a pneumococcal vaccine if patient has not already had one.  Patient voiced understanding.  Patient consented to methotrexate use.  Will upload into chart.  Will plan to initiate methotrexate once patient's labs return.    Educated patient on how to use a vial and syringe and reviewed injection technique with patient.  Patient was able to demonstrate proper technique for injections using vial and syringe.  Provided patient on educational material regarding injection technique and storage of methotrexate.    Patient reports difficulty affording Otezla.  Patient has been using samples provided from our office.  Reviewed patient's insurance, noted  that [employer] [place] has expired and medication is non-formulary.  Will submit prior authorization.  [facility]; however, patient does not think he would qualify.  Medication Samples have been provided to the patient.  Drug name: Henderson BaltimoreOtezla, Strength: Starter Pack/ 30 mg; Qty: 3 Starter Packs/One Maintenance Pack; Dosing instructions: Take 30 mg by mouth twice daily; LOT: [id number], [id number]; Exp.Date: 12/2016 (Advised patient not to use beyond June due to expiration date).  The patient has been instructed regarding the correct time, dose, and frequency of taking this medication, including desired effects and most common side effects.  Will update patient once I know outcome of prior authorization request.     Lilla Shookachel Henderson, Pharm.D., BCPS Clinical Pharmacist Pager: [phone] Phone: [phone] 12/27/2016 4:29 PM

## 2016-12-27 NOTE — Progress Notes (Signed)
Office Visit Note  Patient: Alan Roman             Date of Birth: 1946-08-01           MRN: [id number]             PCP: Glenda Chroman, MD Referring: Glenda Chroman, MD Visit Date: 12/27/2016 Occupation: _0 @    Subjective:  Pain of the Right Hand; Pain of the Left Hand; Pain of the Left Foot; Pain of the Right Foot; Joint Pain; and Medication Management   History of Present Illness: Jagdishcha Dain is a 71 y.o. male  History of psoriasis and psoriatic arthritis. Has been taking Otezla 30 mg twice a day as prescribed and has had adequate response. He states she's been taking otezla on a regular basis but he forgot to take [place] for couple of days and started having a flare 4 days ago. He's been having pain and swelling in his left hand and left foot. In his left foot his left third and fourth toes swollen. Right hand right third PIP is swollen and left hand left fourth and fifth PIP and MCPs are swollen. He is worked in today for evaluation and treatment. He has recurrence of rash as well.  He spoke to a friend, who is a rheumatologist, who recommended a medication that may start with the C and maybe add methotrexate. He would like to discuss this in greater detail in the office today. (Medication may be Cosentyx)    Activities of Daily Living:  Patient reports morning stiffness for 30 minutes.   Patient Denies nocturnal pain.  Difficulty dressing/grooming: Denies Difficulty climbing stairs: Reports Difficulty getting out of chair: Reports Difficulty using hands for taps, buttons, cutlery, and/or writing: Reports   Review of Systems  Constitutional: Positive for fatigue. Negative for night sweats and weakness ( ).  HENT: Negative for mouth sores, mouth dryness and nose dryness.   Eyes: Negative for redness and dryness.  Respiratory: Negative for shortness of breath and difficulty breathing.   Cardiovascular: Negative for chest pain, palpitations, hypertension,  irregular heartbeat and swelling in legs/feet.  Gastrointestinal: Negative for constipation and diarrhea.  Endocrine: Negative for increased urination.  Musculoskeletal: Positive for arthralgias, joint pain, joint swelling and morning stiffness. Negative for myalgias, muscle weakness, muscle tenderness and myalgias.  Skin: Positive for rash. Negative for color change, hair loss, nodules/bumps, skin tightness, ulcers and sensitivity to sunlight.       Psoriasis rash on lower extremities  Allergic/Immunologic: Negative for susceptible to infections.  Neurological: Negative for dizziness, fainting, memory loss and night sweats.  Hematological: Negative for swollen glands.  Psychiatric/Behavioral: Negative for depressed mood and sleep disturbance. The patient is not nervous/anxious.     PMFS History:  Patient Active Problem List   Diagnosis Date Noted  . High risk medication use 12/27/2016  . Orthostatic hypotension 11/05/2015  . Constipation 11/05/2015  . Thrombocytopenia ([place]) 11/05/2015  . Normocytic anemia 11/05/2015  . Psoriatic arthritis ([place]) 11/03/2015  . Essential hypertension 11/03/2015  . Osteoarthritis of left knee 11/03/2015  . S/P total knee replacement using cement 11/03/2015  . Transaminitis 02/02/2013  . Extrapulmonary TB (tuberculosis) 02/02/2013  . Pulmonary infiltrate 01/29/2013  . Pleural effusion 01/26/2013  . Acute bronchiolitis due to other infectious organisms 01/26/2013  . Hyponatremia 01/24/2013  . Psoriasis 01/23/2013  . Fever, unknown origin 01/23/2013  . Immunocompromised state ([place]) 01/23/2013    Past Medical History:  Diagnosis Date  . Arthritis    "  knees" (11/03/2015)  . History of hiatal hernia 2007; 2009  . Hypertension     No family history on file. Past Surgical History:  Procedure Laterality Date  . CATARACT EXTRACTION W/ INTRAOCULAR LENS  IMPLANT, BILATERAL Bilateral   . HERNIA REPAIR    . HIATAL HERNIA REPAIR  2007; 2009  . JOINT  REPLACEMENT    . TOTAL KNEE ARTHROPLASTY Left 11/03/2015  . TOTAL KNEE ARTHROPLASTY Left 11/03/2015   Procedure: Left Total Knee Arthroplasty;  Surgeon: Peter W Whitfield, MD;  Location: [facility] OR;  Service: Orthopedics;  Laterality: Left;  . VIDEO BRONCHOSCOPY Bilateral 01/31/2013   Procedure: VIDEO BRONCHOSCOPY WITHOUT FLUORO;  Surgeon: David B Simonds, MD;  Location: [facility] ENDOSCOPY;  Service: Cardiopulmonary;  Laterality: Bilateral;   Social History   Social History Narrative  . No narrative on file     Objective: Vital Signs: BP 108/60   Pulse 64   Resp 14   Wt 156 lb (70.8 kg)   BMI 23.72 kg/m    Physical Exam  Constitutional: He is oriented to person, place, and time. He appears well-developed and well-nourished.  HENT:  Head: Normocephalic and atraumatic.  Eyes: Conjunctivae and EOM are normal. Pupils are equal, round, and reactive to light.  Neck: Normal range of motion. Neck supple.  Cardiovascular: Normal rate, regular rhythm and normal heart sounds.  Exam reveals no gallop and no friction rub.   No murmur heard. Pulmonary/Chest: Effort normal and breath sounds normal. No respiratory distress. He has no wheezes. He has no rales. He exhibits no tenderness.  Abdominal: Soft. Bowel sounds are normal. He exhibits no distension and no mass. There is no tenderness. There is no guarding.  Musculoskeletal: Normal range of motion. He exhibits tenderness (left 4th and 5th digits of hand and foot (with swelling)).  Lymphadenopathy:    He has no cervical adenopathy.  Neurological: He is alert and oriented to person, place, and time. He exhibits normal muscle tone. Coordination normal.  Skin: Skin is warm and dry. Capillary refill takes less than 2 seconds. Rash noted.  Psoriasis rash with erythematous plaques on lower extremities  Psychiatric: He has a normal mood and affect. His behavior is normal. Judgment and thought content normal.  Nursing note and vitals reviewed.    Musculoskeletal  Exam: C-spine and thoracic lumbar spine good range of motion. Shoulder joints elbow joints good range of motion. He has synovitis in his joints as described below.  CDAI Exam: CDAI Homunculus Exam:   Tenderness:  Right hand: 3rd PIP Left hand: 4th MCP, 5th MCP, 4th PIP and 5th PIP Left foot: 3rd MTP, 4th MTP, 3rd PIP and 4th PIP  Swelling:  Right hand: 3rd PIP Left hand: 4th MCP, 5th MCP, 4th PIP and 5th PIP Left foot: 3rd MTP, 4th MTP, 3rd PIP and 4th PIP  Joint Counts:  CDAI Tender Joint count: 5 CDAI Swollen Joint count: 5  Global Assessments:  Patient Global Assessment: 7 Provider Global Assessment: 7  CDAI Calculated Score: 24    Investigation: Findings:  Made 11 2018 labs from Dr. Ganji's office CMP normal, cholesterol 170 HDL 41 and LDL 113   No visits with results within 4 Month(s) from this visit.  Latest known visit with results is:  Admission on 11/03/2015, Discharged on 11/07/2015  Component Date Value Ref Range Status  . WBC 11/04/2015 7.5  4.0 - 10.5 K/uL Final  . RBC 11/04/2015 3.59* 4.22 - 5.81 MIL/uL Final  . Hemoglobin 11/04/2015 11.7* 13.0 -   17.0 g/dL Final  . HCT 11/04/2015 33.4* 39.0 - 52.0 % Final  . MCV 11/04/2015 93.0  78.0 - 100.0 fL Final  . MCH 11/04/2015 32.6  26.0 - 34.0 pg Final  . MCHC 11/04/2015 35.0  30.0 - 36.0 g/dL Final  . RDW 11/04/2015 12.3  11.5 - 15.5 % Final  . Platelets 11/04/2015 134* 150 - 400 K/uL Final  . Sodium 11/04/2015 134* 135 - 145 mmol/L Final  . Potassium 11/04/2015 4.0  3.5 - 5.1 mmol/L Final   SPECIMEN HEMOLYZED. HEMOLYSIS MAY AFFECT INTEGRITY OF RESULTS.  . Chloride 11/04/2015 99* 101 - 111 mmol/L Final  . CO2 11/04/2015 24  22 - 32 mmol/L Final  . Glucose, Bld 11/04/2015 108* 65 - 99 mg/dL Final  . BUN 11/04/2015 15  6 - 20 mg/dL Final  . Creatinine, Ser 11/04/2015 1.13  0.61 - 1.24 mg/dL Final  . Calcium 11/04/2015 7.8* 8.9 - 10.3 mg/dL Final  . GFR calc non Af Amer 11/04/2015 >60  >60 mL/min Final  .  GFR calc Af Amer 11/04/2015 >60  >60 mL/min Final   Comment: (NOTE) The eGFR has been calculated using the CKD EPI equation. This calculation has not been validated in all clinical situations. eGFR's persistently <60 mL/min signify possible Chronic Kidney Disease.   . Anion gap 11/04/2015 11  5 - 15 Final  . WBC 11/05/2015 7.8  4.0 - 10.5 K/uL Final  . RBC 11/05/2015 3.36* 4.22 - 5.81 MIL/uL Final  . Hemoglobin 11/05/2015 11.0* 13.0 - 17.0 g/dL Final  . HCT 11/05/2015 31.3* 39.0 - 52.0 % Final  . MCV 11/05/2015 93.2  78.0 - 100.0 fL Final  . MCH 11/05/2015 32.7  26.0 - 34.0 pg Final  . MCHC 11/05/2015 35.1  30.0 - 36.0 g/dL Final  . RDW 11/05/2015 12.4  11.5 - 15.5 % Final  . Platelets 11/05/2015 123* 150 - 400 K/uL Final  . Sodium 11/05/2015 136  135 - 145 mmol/L Final  . Potassium 11/05/2015 3.5  3.5 - 5.1 mmol/L Final  . Chloride 11/05/2015 100* 101 - 111 mmol/L Final  . CO2 11/05/2015 26  22 - 32 mmol/L Final  . Glucose, Bld 11/05/2015 104* 65 - 99 mg/dL Final  . BUN 11/05/2015 10  6 - 20 mg/dL Final  . Creatinine, Ser 11/05/2015 1.01  0.61 - 1.24 mg/dL Final  . Calcium 11/05/2015 7.9* 8.9 - 10.3 mg/dL Final  . GFR calc non Af Amer 11/05/2015 >60  >60 mL/min Final  . GFR calc Af Amer 11/05/2015 >60  >60 mL/min Final   Comment: (NOTE) The eGFR has been calculated using the CKD EPI equation. This calculation has not been validated in all clinical situations. eGFR's persistently <60 mL/min signify possible Chronic Kidney Disease.   . Anion gap 11/05/2015 10  5 - 15 Final  . Specimen Description 11/05/2015 URINE, CLEAN CATCH   Final  . Special Requests 11/05/2015 NONE   Final  . Culture 11/05/2015 MULTIPLE SPECIES PRESENT, SUGGEST RECOLLECTION   Final  . Report Status 11/05/2015 11/06/2015 FINAL   Final  . Color, Urine 11/05/2015 YELLOW  YELLOW Final  . APPearance 11/05/2015 CLEAR  CLEAR Final  . Specific Gravity, Urine 11/05/2015 1.008  1.005 - 1.030 Final  . pH 11/05/2015  7.0  5.0 - 8.0 Final  . Glucose, UA 11/05/2015 NEGATIVE  NEGATIVE mg/dL Final  . Hgb urine dipstick 11/05/2015 TRACE* NEGATIVE Final  . Bilirubin Urine 11/05/2015 NEGATIVE  NEGATIVE Final  . Ketones, ur   11/05/2015 NEGATIVE  NEGATIVE mg/dL Final  . Protein, ur 11/05/2015 NEGATIVE  NEGATIVE mg/dL Final  . Nitrite 11/05/2015 NEGATIVE  NEGATIVE Final  . Leukocytes, UA 11/05/2015 NEGATIVE  NEGATIVE Final  . Specimen Description 11/05/2015 BLOOD RIGHT ARM   Final  . Special Requests 11/05/2015 IN PEDIATRIC BOTTLE 2CC   Final  . Culture 11/05/2015 NO GROWTH 5 DAYS   Final  . Report Status 11/05/2015 11/10/2015 FINAL   Final  . Specimen Description 11/05/2015 BLOOD RIGHT HAND   Final  . Special Requests 11/05/2015 IN PEDIATRIC BOTTLE 4CC   Final  . Culture 11/05/2015 NO GROWTH 5 DAYS   Final  . Report Status 11/05/2015 11/10/2015 FINAL   Final  . Lactic Acid, Venous 11/05/2015 2.3* 0.5 - 2.0 mmol/L Final   Comment: CRITICAL RESULT CALLED TO, READ BACK BY AND VERIFIED WITH: B BHUNU,RN 1712 11/05/2015 WBOND   . Lactic Acid, Venous 11/05/2015 2.0  0.5 - 2.0 mmol/L Final  . Cortisol, Plasma 11/05/2015 14.2  ug/dL Final   Comment: (NOTE) AM    6.7 - 22.6 ug/dL PM   <10.0       ug/dL   . Procalcitonin 11/05/2015 0.12  ng/mL Final   Comment:        Interpretation: PCT (Procalcitonin) <= 0.5 ng/mL: Systemic infection (sepsis) is not likely. Local bacterial infection is possible. (NOTE)         ICU PCT Algorithm               Non ICU PCT Algorithm    ----------------------------     ------------------------------         PCT < 0.25 ng/mL                 PCT < 0.1 ng/mL     Stopping of antibiotics            Stopping of antibiotics       strongly encouraged.               strongly encouraged.    ----------------------------     ------------------------------       PCT level decrease by               PCT < 0.25 ng/mL       >= 80% from peak PCT       OR PCT 0.25 - 0.5 ng/mL          Stopping of  antibiotics                                             encouraged.     Stopping of antibiotics           encouraged.    ----------------------------     ------------------------------       PCT level decrease by              PCT >= 0.25 ng/mL       < 80% from peak PCT        AND PCT >= 0.5 ng/mL            Continuin                          g antibiotics  encouraged.       Continuing antibiotics            encouraged.    ----------------------------     ------------------------------     PCT level increase compared          PCT > 0.5 ng/mL         with peak PCT AND          PCT >= 0.5 ng/mL             Escalation of antibiotics                                          strongly encouraged.      Escalation of antibiotics        strongly encouraged.   . Total Protein 11/05/2015 5.8* 6.5 - 8.1 g/dL Final  . Albumin 11/05/2015 2.6* 3.5 - 5.0 g/dL Final  . AST 11/05/2015 27  15 - 41 U/L Final  . ALT 11/05/2015 12* 17 - 63 U/L Final  . Alkaline Phosphatase 11/05/2015 53  38 - 126 U/L Final  . Total Bilirubin 11/05/2015 0.9  0.3 - 1.2 mg/dL Final  . Bilirubin, Direct 11/05/2015 0.2  0.1 - 0.5 mg/dL Final  . Indirect Bilirubin 11/05/2015 0.7  0.3 - 0.9 mg/dL Final  . WBC 11/06/2015 7.0  4.0 - 10.5 K/uL Final   REPEATED TO VERIFY  . RBC 11/06/2015 2.83* 4.22 - 5.81 MIL/uL Final  . Hemoglobin 11/06/2015 9.3* 13.0 - 17.0 g/dL Final   REPEATED TO VERIFY  . HCT 11/06/2015 26.3* 39.0 - 52.0 % Final  . MCV 11/06/2015 92.9  78.0 - 100.0 fL Final  . MCH 11/06/2015 32.9  26.0 - 34.0 pg Final  . MCHC 11/06/2015 35.4  30.0 - 36.0 g/dL Final  . RDW 11/06/2015 12.4  11.5 - 15.5 % Final  . Platelets 11/06/2015 113* 150 - 400 K/uL Final   Comment: SPECIMEN CHECKED FOR CLOTS PLATELET COUNT CONFIRMED BY SMEAR   . Sodium 11/06/2015 135  135 - 145 mmol/L Final  . Potassium 11/06/2015 3.3* 3.5 - 5.1 mmol/L Final  . Chloride 11/06/2015 104  101 - 111 mmol/L  Final  . CO2 11/06/2015 22  22 - 32 mmol/L Final  . Glucose, Bld 11/06/2015 139* 65 - 99 mg/dL Final  . BUN 11/06/2015 8  6 - 20 mg/dL Final  . Creatinine, Ser 11/06/2015 0.91  0.61 - 1.24 mg/dL Final  . Calcium 11/06/2015 7.1* 8.9 - 10.3 mg/dL Final  . GFR calc non Af Amer 11/06/2015 >60  >60 mL/min Final  . GFR calc Af Amer 11/06/2015 >60  >60 mL/min Final   Comment: (NOTE) The eGFR has been calculated using the CKD EPI equation. This calculation has not been validated in all clinical situations. eGFR's persistently <60 mL/min signify possible Chronic Kidney Disease.   . Anion gap 11/06/2015 9  5 - 15 Final  . Squamous Epithelial / LPF 11/05/2015 0-5* NONE SEEN Final  . WBC, UA 11/05/2015 0-5  0 - 5 WBC/hpf Final  . RBC / HPF 11/05/2015 0-5  0 - 5 RBC/hpf Final  . Bacteria, UA 11/05/2015 NONE SEEN  NONE SEEN Final  . Procalcitonin 11/07/2015 <0.10  ng/mL Final   Comment:        Interpretation: PCT (Procalcitonin) <= 0.5 ng/mL: Systemic infection (sepsis) is not likely. Local bacterial infection is possible. (  NOTE)         ICU PCT Algorithm               Non ICU PCT Algorithm    ----------------------------     ------------------------------         PCT < 0.25 ng/mL                 PCT < 0.1 ng/mL     Stopping of antibiotics            Stopping of antibiotics       strongly encouraged.               strongly encouraged.    ----------------------------     ------------------------------       PCT level decrease by               PCT < 0.25 ng/mL       >= 80% from peak PCT       OR PCT 0.25 - 0.5 ng/mL          Stopping of antibiotics                                             encouraged.     Stopping of antibiotics           encouraged.    ----------------------------     ------------------------------       PCT level decrease by              PCT >= 0.25 ng/mL       < 80% from peak PCT        AND PCT >= 0.5 ng/mL            Continuin                          g antibiotics                                               encouraged.       Continuing antibiotics            encouraged.    ----------------------------     ------------------------------     PCT level increase compared          PCT > 0.5 ng/mL         with peak PCT AND          PCT >= 0.5 ng/mL             Escalation of antibiotics                                          strongly encouraged.      Escalation of antibiotics        strongly encouraged.      Imaging: No results found.  Speciality Comments: No specialty comments available.    Procedures:  No procedures performed Allergies: Patient has no known allergies.   Assessment / Plan:     Visit Diagnoses: Psoriatic arthritis (HCC): He is having a flare of his psoriatic arthritis with   synovitis in multiple joints as described above. He has missed a few doses of Otezla. I'm uncertain about the flare is due to missing dose of otezla or inadequate response to [employer]. We discussed different treatment options and their side effects. He has taken oral methotrexate in the past. We discussed the starting on subcutaneous methotrexate. Informed consent was obtained today. We'll start him on methotrexate 0.4 mL subcutaneous every week. And then increase it to 0.6 subcutaneous every week and if tolerated to 0.8 subcutaneous every week. He will need folic acid 2 mg by mouth daily. We will also refill his otezla and we'll give otezla samples today. We will try a combination therapy for couple of months if he has inadequate response then we may switch him to Cosyntex. We had detailed discussion regarding that.  Psoriasis: He is having a flare with psoriasis patches on his extremities. I offered a prednisone taper he has some prednisone hasn't home and he will take it taper himself.   High risk medication use: Otezla 30 mg by mouth twice a day   Left hand and foot pain  positive synovitis in hands and feet .   Orders: Orders Placed This Encounter    Procedures  . CBC with Differential/Platelet  . Protein electrophoresis, serum  . IgG, IgA, IgM  . Hepatitis B surface antigen  . Hepatitis C antibody  . Hepatitis B core antibody, IgM  . Uric acid   Meds ordered this encounter  Medications  . Apremilast (OTEZLA) 30 MG TABS    Sig: Take 30 mg by mouth 2 (two) times daily.    Dispense:  60 tablet    Refill:  2    Face-to-face time spent with patient was 30 minutes. 50% of time was spent in counseling and coordination of care.  Follow-Up Instructions: Return in about 3 months (around 03/29/2017) for Psoriatic arthritis, psoriasis.   Bo Merino, MD  Note - This record has been created using Editor, commissioning.  Chart creation errors have been sought, but may not always  have been located. Such creation errors do not reflect on  the standard of medical care.

## 2016-12-27 NOTE — Patient Instructions (Addendum)
Please go get labs drawn.    Start taking methotrexate 0.4 mL weekly for two weeks, then get labs.  If labs are normal, we will increase methotrexate to 0.6 mL weekly for two weeks, then get labs.  If labs are normal, we will increase methotrexate to 0.8 mL weekly.  Gets labs again 2 weeks after increasing dose.  If labs are stable, we will continue methotrexate 0.8 mL weekly  Start taking folic acid 2 mg daily.    Standing Labs We placed an order today for your standing lab work.    Please come back and get your standing labs every 2 weeks x 3, then every 2 months.    We have open lab Monday through Friday from 8:30-11:30 AM and 1:30-4 PM at the office of Dr. Arbutus PedShaili Lynee Rosenbach/Naitik Panwala, PA.   The office is located at [address], [address], [place], [place] [address] No appointment is necessary.   Labs are drawn by [facility].  You may receive a bill from [facility] for your lab work. If you have any questions regarding directions or hours of operation,  please call [phone].     Methotrexate injection What is this medicine? METHOTREXATE (METH oh TREX ate) is a chemotherapy drug used to treat cancer including breast cancer, leukemia, and lymphoma. This medicine can also be used to treat psoriasis and certain kinds of arthritis. This medicine may be used for other purposes; ask your health care provider or pharmacist if you have questions. What should I tell my health care provider before I take this medicine? They need to know if you have any of these conditions: -fluid in the stomach area or lungs -if you often drink alcohol -infection or immune system problems -kidney disease -liver disease -low blood counts, like low white cell, platelet, or red cell counts -lung disease -radiation therapy -stomach ulcers -ulcerative colitis -an unusual or allergic reaction to methotrexate, other medicines, foods, dyes, or preservatives -pregnant or trying to get  pregnant -breast-feeding How should I use this medicine? This medicine is for infusion into a vein or for injection into muscle or into the spinal fluid (whichever applies). It is usually given by a health care professional in a hospital or clinic setting. In rare cases, you might get this medicine at home. You will be taught how to give this medicine. Use exactly as directed. Take your medicine at regular intervals. Do not take your medicine more often than directed. If this medicine is used for arthritis or psoriasis, it should be taken weekly, NOT daily. It is important that you put your used needles and syringes in a special sharps container. Do not put them in a trash can. If you do not have a sharps container, call your pharmacist or healthcare provider to get one. Talk to your pediatrician regarding the use of this medicine in children. While this drug may be prescribed for children as young as 2 years for selected conditions, precautions do apply. Overdosage: If you think you have taken too much of this medicine contact a poison control center or emergency room at once. NOTE: This medicine is only for you. Do not share this medicine with others. What if I miss a dose? It is important not to miss your dose. Call your doctor or health care professional if you are unable to keep an appointment. If you give yourself the medicine and you miss a dose, talk with your doctor or health care professional. Do not take double or extra doses.  What may interact with this medicine? This medicine may interact with the following medications: -acitretin -aspirin or aspirin-like medicines including salicylates -azathioprine -certain antibiotics like chloramphenicol, penicillin, tetracycline -certain medicines for stomach problems like esomeprazole, omeprazole, pantoprazole -cyclosporine -gold -hydroxychloroquine -live virus vaccines -mercaptopurine -NSAIDs, medicines for pain and inflammation, like  ibuprofen or naproxen -other cytotoxic agents -penicillamine -phenylbutazone -phenytoin -probenacid -retinoids such as isotretinoin and tretinoin -steroid medicines like prednisone or cortisone -sulfonamides like sulfasalazine and trimethoprim/sulfamethoxazole -theophylline This list may not describe all possible interactions. Give your health care provider a list of all the medicines, herbs, non-prescription drugs, or dietary supplements you use. Also tell them if you smoke, drink alcohol, or use illegal drugs. Some items may interact with your medicine. What should I watch for while using this medicine? Avoid alcoholic drinks. In some cases, you may be given additional medicines to help with side effects. Follow all directions for their use. This medicine can make you more sensitive to the sun. Keep out of the sun. If you cannot avoid being in the sun, wear protective clothing and use sunscreen. Do not use sun lamps or tanning beds/booths. You may get drowsy or dizzy. Do not drive, use machinery, or do anything that needs mental alertness until you know how this medicine affects you. Do not stand or sit up quickly, especially if you are an older patient. This reduces the risk of dizzy or fainting spells. You may need blood work done while you are taking this medicine. Call your doctor or health care professional for advice if you get a fever, chills or sore throat, or other symptoms of a cold or flu. Do not treat yourself. This drug decreases your body's ability to fight infections. Try to avoid being around people who are sick. This medicine may increase your risk to bruise or bleed. Call your doctor or health care professional if you notice any unusual bleeding. Check with your doctor or health care professional if you get an attack of severe diarrhea, nausea and vomiting, or if you sweat a lot. The loss of too much body fluid can make it dangerous for you to take this medicine. Talk to your  doctor about your risk of cancer. You may be more at risk for certain types of cancers if you take this medicine. Both men and women must use effective birth control with this medicine. Do not become pregnant while taking this medicine or until at least 1 normal menstrual cycle has occurred after stopping it. Women should inform their doctor if they wish to become pregnant or think they might be pregnant. Men should not father a child while taking this medicine and for 3 months after stopping it. There is a potential for serious side effects to an unborn child. Talk to your health care professional or pharmacist for more information. Do not breast-feed an infant while taking this medicine. What side effects may I notice from receiving this medicine? Side effects that you should report to your doctor or health care professional as soon as possible: -allergic reactions like skin rash, itching or hives, swelling of the face, lips, or tongue -back pain -breathing problems or shortness of breath -confusion -diarrhea -dry, nonproductive cough -low blood counts - this medicine may decrease the number of white blood cells, red blood cells and platelets. You may be at increased risk of infections and bleeding -mouth sores -redness, blistering, peeling or loosening of the skin, including inside the mouth -seizures -severe headaches -signs of infection - fever  or chills, cough, sore throat, pain or difficulty passing urine -signs and symptoms of bleeding such as bloody or black, tarry stools; red or dark-brown urine; spitting up blood or brown material that looks like coffee grounds; red spots on the skin; unusual bruising or bleeding from the eye, gums, or nose -signs and symptoms of kidney injury like trouble passing urine or change in the amount of urine -signs and symptoms of liver injury like dark yellow or brown urine; general ill feeling or flu-like symptoms; light-colored stools; loss of appetite;  nausea; right upper belly pain; unusually weak or tired; yellowing of the eyes or skin -stiff neck -vomiting Side effects that usually do not require medical attention (report to your doctor or health care professional if they continue or are bothersome): -dizziness -hair loss -headache -stomach pain -upset stomach This list may not describe all possible side effects. Call your doctor for medical advice about side effects. You may report side effects to FDA at 1-800-FDA-1088. Where should I keep my medicine? If you are using this medicine at home, you will be instructed on how to store this medicine. Throw away any unused medicine after the expiration date on the label. NOTE: This sheet is a summary. It may not cover all possible information. If you have questions about this medicine, talk to your doctor, pharmacist, or health care provider.  2018 Elsevier/Gold Standard (2014-11-06 12:36:41)

## 2016-12-28 ENCOUNTER — Telehealth: Payer: Self-pay

## 2016-12-28 LAB — CBC WITH DIFFERENTIAL/PLATELET
Basophils Absolute: 70 cells/uL (ref 0–200)
Basophils Relative: 1 %
Eosinophils Absolute: 350 cells/uL (ref 15–500)
Eosinophils Relative: 5 %
HCT: 40.2 % (ref 38.5–50.0)
Hemoglobin: 13.4 g/dL (ref 13.2–17.1)
Lymphocytes Relative: 18 %
Lymphs Abs: 1260 cells/uL (ref 850–3900)
MCH: 31 pg (ref 27.0–33.0)
MCHC: 33.3 g/dL (ref 32.0–36.0)
MCV: 93.1 fL (ref 80.0–100.0)
MPV: 10.3 fL (ref 7.5–12.5)
Monocytes Absolute: 700 cells/uL (ref 200–950)
Monocytes Relative: 10 %
Neutro Abs: 4620 cells/uL (ref 1500–7800)
Neutrophils Relative %: 66 %
Platelets: 198 10*3/uL (ref 140–400)
RBC: 4.32 MIL/uL (ref 4.20–5.80)
RDW: 12.8 % (ref 11.0–15.0)
WBC: 7 10*3/uL (ref 3.8–10.8)

## 2016-12-28 NOTE — Telephone Encounter (Signed)
A prior authorization for [place] has been submitted via Cover My Meds. Will update once we receive a response.  Alayne Estrella, [place], CPhT 9:52 AM

## 2016-12-29 ENCOUNTER — Ambulatory Visit: Payer: Medicare Other | Admitting: Rheumatology

## 2016-12-29 LAB — HEPATITIS B CORE ANTIBODY, IGM: Hep B C IgM: NONREACTIVE

## 2016-12-29 LAB — HEPATITIS C ANTIBODY: HCV Ab: NEGATIVE

## 2016-12-29 LAB — HEPATITIS B SURFACE ANTIGEN: Hepatitis B Surface Ag: NEGATIVE

## 2016-12-29 LAB — URIC ACID: Uric Acid, Serum: 5.6 mg/dL (ref 4.0–8.0)

## 2016-12-29 NOTE — Telephone Encounter (Signed)
Received a fax from  regarding a prior authorization approval for Otezla from through 07/31/17.  Reference number:none  Phone [phone]  Will send document to scan center.  Spoke with patient to update him. He would like his Rx for [facility] [place] and MTX sent to [facility] after his lab work comes back. He has a high copay for [place] and we will look into patient assistance for him once his faxes from 2018 comes back. He got an extension. He has samples from his last visit that he is currently using.   Sible Straley, [place], CPhT  8:36 AM

## 2016-12-30 LAB — PROTEIN ELECTROPHORESIS, SERUM
Albumin ELP: 3.5 g/dL — ABNORMAL LOW (ref 3.8–4.8)
Alpha-1-Globulin: 0.3 g/dL (ref 0.2–0.3)
Alpha-2-Globulin: 0.8 g/dL (ref 0.5–0.9)
Beta 2: 0.4 g/dL (ref 0.2–0.5)
Beta Globulin: 0.4 g/dL (ref 0.4–0.6)
Gamma Globulin: 1.3 g/dL (ref 0.8–1.7)
Total Protein, Serum Electrophoresis: 6.9 g/dL (ref 6.1–8.1)

## 2017-01-03 ENCOUNTER — Telehealth: Payer: Self-pay | Admitting: Rheumatology

## 2017-01-03 DIAGNOSIS — Z79899 Other long term (current) drug therapy: Secondary | ICD-10-CM

## 2017-01-03 LAB — IGG, IGA, IGM
IgA: 288 mg/dL (ref 81–463)
IgG (Immunoglobin G), Serum: 1266 mg/dL (ref 694–1618)
IgM, Serum: 219 mg/dL (ref 48–271)

## 2017-01-03 NOTE — Telephone Encounter (Signed)
Reviewed labs. Ok to start on MTX. As discussed.

## 2017-01-04 MED ORDER — "TUBERCULIN-ALLERGY SYRINGES 27G X 1/2"" 1 ML KIT": 1.0000 | PACK | 3 refills | Status: DC

## 2017-01-04 MED ORDER — FOLIC ACID 1 MG PO TABS: 2.0000 mg | ORAL_TABLET | Freq: Every day | ORAL | 3 refills | Status: DC

## 2017-01-04 MED ORDER — METHOTREXATE SODIUM CHEMO INJECTION 50 MG/2ML: INTRAMUSCULAR | 0 refills | Status: DC

## 2017-01-04 NOTE — Telephone Encounter (Signed)
I called patient and reviewed labs.  Reviewed methotrexate dosing plan (0.4 mL weekly x 2 weeks, then if labs are normal 0.6 mL weekly x 2 weeks, then if labs are normal 0.8 mL weekly), folic acid 2 mg daily, and labs every 2 weeks x 3 then every 2 months.  Patient voiced understanding and denies any questions.    Lilla Shookachel Corah Willeford, Pharm.D., BCPS, CPP Clinical Pharmacist Pager: [phone] Phone: [phone] 01/04/2017 8:30 AM

## 2017-01-11 ENCOUNTER — Ambulatory Visit: Payer: Medicare Other | Admitting: Rheumatology

## 2017-01-12 NOTE — Progress Notes (Signed)
Visit Diagnoses: Psoriatic arthritis [facility](HCC): He is having a flare of his psoriatic arthritis with synovitis in multiple joints as described above. He has missed a few doses of Otezla. I'm uncertain about the flare is due to missing dose of otezla or inadequate response to [employer]. We discussed different treatment options and their side effects. He has taken oral methotrexate in the past. We discussed the starting on subcutaneous methotrexate. Informed consent was obtained today. We'll start him on methotrexate 0.4 mL subcutaneous every week. And then increase it to 0.6 subcutaneous every week and if tolerated to 0.8 subcutaneous every week. He will need folic acid 2 mg by mouth daily. We will also refill his otezla and we'll give otezla samples today. We will try a combination therapy for couple of months if he has inadequate response then we may switch him to Cosyntex. We had detailed discussion regarding that.  Psoriasis: He is having a flare with psoriasis patches on his extremities. I offered a prednisone taper he has some prednisone hasn't home and he will take it taper himself.   High risk medication use: Otezla 30 mg by mouth twice a day   Left hand and foot pain  positive synovitis in hands and feet .  The ultrasound findings were discussed with patient. He was placed on methotrexate last visit. He is on 4 tablets by mouth every week. We will check labs today and he'll be increasing his methotrexate to 6 tablets by mouth every week. I will keep him on 6 tablets per week for right now. Pollyann SavoyShaili Deveshwar, MD

## 2017-01-16 ENCOUNTER — Ambulatory Visit (INDEPENDENT_AMBULATORY_CARE_PROVIDER_SITE_OTHER): Payer: Medicare Other | Admitting: Rheumatology

## 2017-01-16 ENCOUNTER — Inpatient Hospital Stay (INDEPENDENT_AMBULATORY_CARE_PROVIDER_SITE_OTHER): Payer: Self-pay

## 2017-01-16 DIAGNOSIS — Z79899 Other long term (current) drug therapy: Secondary | ICD-10-CM | POA: Diagnosis not present

## 2017-01-16 DIAGNOSIS — M79642 Pain in left hand: Secondary | ICD-10-CM

## 2017-01-16 DIAGNOSIS — M79641 Pain in right hand: Secondary | ICD-10-CM

## 2017-01-16 LAB — COMPLETE METABOLIC PANEL WITH GFR
ALT: 16 U/L (ref 9–46)
AST: 23 U/L (ref 10–35)
Albumin: 3.7 g/dL (ref 3.6–5.1)
Alkaline Phosphatase: 87 U/L (ref 40–115)
BUN: 20 mg/dL (ref 7–25)
CO2: 26 mmol/L (ref 20–31)
Calcium: 8.8 mg/dL (ref 8.6–10.3)
Chloride: 104 mmol/L (ref 98–110)
Creat: 0.89 mg/dL (ref 0.70–1.18)
GFR, Est African American: >89 mL/min (ref >=60)
GFR, Est Non African American: 85 mL/min (ref >=60)
Glucose, Bld: 117 mg/dL — ABNORMAL HIGH (ref 65–99)
Potassium: 4.6 mmol/L (ref 3.5–5.3)
Sodium: 137 mmol/L (ref 135–146)
Total Bilirubin: 0.6 mg/dL (ref 0.2–1.2)
Total Protein: 6.9 g/dL (ref 6.1–8.1)

## 2017-01-16 LAB — CBC WITH DIFFERENTIAL/PLATELET
Basophils Absolute: 58 cells/uL (ref 0–200)
Basophils Relative: 1 %
Eosinophils Absolute: 290 cells/uL (ref 15–500)
Eosinophils Relative: 5 %
HCT: 37.4 % — ABNORMAL LOW (ref 38.5–50.0)
Hemoglobin: 12.5 g/dL — ABNORMAL LOW (ref 13.2–17.1)
Lymphocytes Relative: 19 %
Lymphs Abs: 1102 cells/uL (ref 850–3900)
MCH: 31 pg (ref 27.0–33.0)
MCHC: 33.4 g/dL (ref 32.0–36.0)
MCV: 92.8 fL (ref 80.0–100.0)
MPV: 9.4 fL (ref 7.5–12.5)
Monocytes Absolute: 464 cells/uL (ref 200–950)
Monocytes Relative: 8 %
Neutro Abs: 3886 cells/uL (ref 1500–7800)
Neutrophils Relative %: 67 %
Platelets: 219 10*3/uL (ref 140–400)
RBC: 4.03 MIL/uL — ABNORMAL LOW (ref 4.20–5.80)
RDW: 12.8 % (ref 11.0–15.0)
WBC: 5.8 10*3/uL (ref 3.8–10.8)

## 2017-01-16 NOTE — Progress Notes (Signed)
Labs are stable.

## 2017-01-30 ENCOUNTER — Telehealth: Payer: Self-pay

## 2017-01-30 NOTE — Telephone Encounter (Signed)
Called patient to set up his ref[facility].   Let message for patient to call back.  Easter Kennebrew, [place], CPhT 11:59 AM

## 2017-02-07 NOTE — Telephone Encounter (Signed)
Tried to call patient on the 6th and third time today 7/10 to set up refill for [facility]. Letf message for patient to call back.  Burnetta Kohls, [place], CPhT 1:27 PM

## 2017-02-13 NOTE — Telephone Encounter (Signed)
Spoke to patient and set up refill with him at the [facility]. Patient should receive his medication on 02/15/17.   Dayanara Sherrill, [place], CPhT 10:07 AM

## 2017-02-13 NOTE — Telephone Encounter (Signed)
Patient returned your phone call.

## 2017-03-17 ENCOUNTER — Other Ambulatory Visit: Payer: Self-pay

## 2017-03-17 ENCOUNTER — Telehealth: Payer: Self-pay | Admitting: Rheumatology

## 2017-03-17 DIAGNOSIS — Z79899 Other long term (current) drug therapy: Secondary | ICD-10-CM | POA: Diagnosis not present

## 2017-03-17 LAB — CBC WITH DIFFERENTIAL/PLATELET
Basophils Absolute: 0 cells/uL (ref 0–200)
Basophils Relative: 0 %
Eosinophils Absolute: 112 cells/uL (ref 15–500)
Eosinophils Relative: 2 %
HCT: 39.7 % (ref 38.5–50.0)
Hemoglobin: 13.6 g/dL (ref 13.2–17.1)
Lymphocytes Relative: 19 %
Lymphs Abs: 1064 cells/uL (ref 850–3900)
MCH: 32.3 pg (ref 27.0–33.0)
MCHC: 34.3 g/dL (ref 32.0–36.0)
MCV: 94.3 fL (ref 80.0–100.0)
MPV: 9.9 fL (ref 7.5–12.5)
Monocytes Absolute: 672 cells/uL (ref 200–950)
Monocytes Relative: 12 %
Neutro Abs: 3752 cells/uL (ref 1500–7800)
Neutrophils Relative %: 67 %
Platelets: 179 10*3/uL (ref 140–400)
RBC: 4.21 MIL/uL (ref 4.20–5.80)
RDW: 14.1 % (ref 11.0–15.0)
WBC: 5.6 10*3/uL (ref 3.8–10.8)

## 2017-03-17 NOTE — Telephone Encounter (Signed)
Patient is requesting an rx for syringes be sen to [facility] outpatient pharmacy. Patient states he has MTX left but only has one syringe left.

## 2017-03-18 LAB — COMPLETE METABOLIC PANEL WITH GFR
ALT: 15 U/L (ref 9–46)
AST: 24 U/L (ref 10–35)
Albumin: 4.1 g/dL (ref 3.6–5.1)
Alkaline Phosphatase: 83 U/L (ref 40–115)
BUN: 19 mg/dL (ref 7–25)
CO2: 20 mmol/L (ref 20–32)
Calcium: 9 mg/dL (ref 8.6–10.3)
Chloride: 104 mmol/L (ref 98–110)
Creat: 1.03 mg/dL (ref 0.70–1.18)
GFR, Est African American: 84 mL/min (ref >=60)
GFR, Est Non African American: 72 mL/min (ref >=60)
Glucose, Bld: 97 mg/dL (ref 65–99)
Potassium: 4.3 mmol/L (ref 3.5–5.3)
Sodium: 137 mmol/L (ref 135–146)
Total Bilirubin: 0.7 mg/dL (ref 0.2–1.2)
Total Protein: 7.2 g/dL (ref 6.1–8.1)

## 2017-03-18 NOTE — Progress Notes (Signed)
WNLs

## 2017-03-20 MED ORDER — "TUBERCULIN-ALLERGY SYRINGES 27G X 1/2"" 1 ML KIT": 1.0000 | PACK | 3 refills | Status: DC

## 2017-03-20 NOTE — Telephone Encounter (Signed)
Sent!

## 2017-03-21 ENCOUNTER — Telehealth: Payer: Self-pay | Admitting: Pharmacist

## 2017-03-21 NOTE — Telephone Encounter (Signed)
I called Mr. Isobe to follow up on his medications.  He reports adherence with Otezla 30 mg BID.  He has been getting his [place] prescription filled at [facility] (last fills on 03/13/17 and 02/13/17).  Patient confirms he is also taking methotrexate 0.6 mL weekly.  Patient had most recent standing labs on 03/17/17 which were normal.  Noted patient is due for a follow up visit this month.  I transferred patient to the front desk to schedule a follow up visit.  Patient denies any questions or concerns regarding his medications at this time.   Lilla Shook, Pharm.D., BCPS, CPP Clinical Pharmacist Pager: [phone] Phone: [phone] 03/21/2017 11:46 AM

## 2017-04-05 NOTE — Progress Notes (Deleted)
Office Visit Note  Patient: Alan Roman             Date of Birth: Jun 07, 1946           MRN: [id number]             PCP: Ignatius Specking, MD Referring: Ignatius Specking, MD Visit Date: 04/12/2017 Occupation: @GUAROCC @    Subjective:  No chief complaint on file.   History of Present Illness: Alan Roman is a 71 y.o. male ***   Activities of Daily Living:  Patient reports morning stiffness for *** {minute/hour:19697}.   Patient {ACTIONS;DENIES/REPORTS:21021675::"Denies"} nocturnal pain.  Difficulty dressing/grooming: {ACTIONS;DENIES/REPORTS:21021675::"Denies"} Difficulty climbing stairs: {ACTIONS;DENIES/REPORTS:21021675::"Denies"} Difficulty getting out of chair: {ACTIONS;DENIES/REPORTS:21021675::"Denies"} Difficulty using hands for taps, buttons, cutlery, and/or writing: {ACTIONS;DENIES/REPORTS:21021675::"Denies"}   No Rheumatology ROS completed.   PMFS History:  Patient Active Problem List   Diagnosis Date Noted  . High risk medication use 12/27/2016  . Orthostatic hypotension 11/05/2015  . Constipation 11/05/2015  . Thrombocytopenia (HCC) 11/05/2015  . Normocytic anemia 11/05/2015  . Psoriatic arthritis (HCC) 11/03/2015  . Essential hypertension 11/03/2015  . Osteoarthritis of left knee 11/03/2015  . S/P total knee replacement using cement 11/03/2015  . Transaminitis 02/02/2013  . Extrapulmonary TB (tuberculosis) 02/02/2013  . Pulmonary infiltrate 01/29/2013  . Pleural effusion 01/26/2013  . Acute bronchiolitis due to other infectious organisms 01/26/2013  . Hyponatremia 01/24/2013  . Psoriasis 01/23/2013  . Fever, unknown origin 01/23/2013  . Immunocompromised state (HCC) 01/23/2013    Past Medical History:  Diagnosis Date  . Arthritis    "knees" (11/03/2015)  . History of hiatal hernia 2007; 2009  . Hypertension     No family history on file. Past Surgical History:  Procedure Laterality Date  . CATARACT EXTRACTION W/ INTRAOCULAR LENS  IMPLANT,  BILATERAL Bilateral   . HERNIA REPAIR    . HIATAL HERNIA REPAIR  2007; 2009  . JOINT REPLACEMENT    . TOTAL KNEE ARTHROPLASTY Left 11/03/2015  . TOTAL KNEE ARTHROPLASTY Left 11/03/2015   Procedure: Left Total Knee Arthroplasty;  Surgeon: Valeria Batman, MD;  Location: [facility] OR;  Service: Orthopedics;  Laterality: Left;  Marland Kitchen VIDEO BRONCHOSCOPY Bilateral 01/31/2013   Procedure: VIDEO BRONCHOSCOPY WITHOUT FLUORO;  Surgeon: Merwyn Katos, MD;  Location: [facility] ENDOSCOPY;  Service: Cardiopulmonary;  Laterality: Bilateral;   Social History   Social History Narrative  . No narrative on file     Objective: Vital Signs: There were no vitals taken for this visit.   Physical Exam   Musculoskeletal Exam: ***  CDAI Exam: No CDAI exam completed.    Investigation: No additional findings. CBC Latest Ref Rng & Units 03/17/2017 01/16/2017 12/27/2016  WBC 3.8 - 10.8 K/uL 5.6 5.8 7.0  Hemoglobin 13.2 - 17.1 g/dL 08.6 12.5(L) 13.4  Hematocrit 38.5 - 50.0 % 39.7 37.4(L) 40.2  Platelets 140 - 400 K/uL 179 219 198   CMP Latest Ref Rng & Units 03/17/2017 01/16/2017 11/06/2015  Glucose 65 - 99 mg/dL 97 578(I) 696(E)  BUN 7 - 25 mg/dL 19 20 8   Creatinine 0.70 - 1.18 mg/dL 9.52 8.41 3.24  Sodium 135 - 146 mmol/L 137 137 135  Potassium 3.5 - 5.3 mmol/L 4.3 4.6 3.3(L)  Chloride 98 - 110 mmol/L 104 104 104  CO2 20 - 32 mmol/L 20 26 22   Calcium 8.6 - 10.3 mg/dL 9.0 8.8 7.1(L)  Total Protein 6.1 - 8.1 g/dL 7.2 6.9 -  Total Bilirubin 0.2 - 1.2 mg/dL 0.7 0.6 -  Alkaline  Phos 40 - 115 U/L 83 87 -  AST 10 - 35 U/L 24 23 -  ALT 9 - 46 U/L 15 16 -    Imaging: No results found.  Speciality Comments: No specialty comments available.    Procedures:  No procedures performed Allergies: Patient has no known allergies.   Assessment / Plan:     Visit Diagnoses: No diagnosis found.    Orders: No orders of the defined types were placed in this encounter.  No orders of the defined types were placed in this  encounter.   Face-to-face time spent with patient was *** minutes. 50% of time was spent in counseling and coordination of care.  Follow-Up Instructions: No Follow-up on file.   Ellen HenriMarissa C Chikita Dogan, NT  Note - This record has been created using Animal nutritionistDragon software.  Chart creation errors have been sought, but may not always  have been located. Such creation errors do not reflect on  the standard of medical care.

## 2017-04-07 ENCOUNTER — Other Ambulatory Visit: Payer: Self-pay | Admitting: Rheumatology

## 2017-04-10 ENCOUNTER — Telehealth: Payer: Self-pay | Admitting: Rheumatology

## 2017-04-10 NOTE — Telephone Encounter (Signed)
Last Visit:01/16/17 Next Visit: 04/12/17 Labs: 03/17/17 WNL  Okay to refill per Dr. Corliss Skainseveshwar

## 2017-04-10 NOTE — Telephone Encounter (Signed)
Patient is getting congestion, cough and a runny nose. Patient states this started after he went on the MTX. Patient has an appointment with his PCP on 04/19/17. Patient is due for MTX on Thursday. Patient does not feel well for 2 days after taking the MTX injection. Patient is getting so hoarse he is barely able to talk. Please advise.

## 2017-04-10 NOTE — Telephone Encounter (Signed)
Patient had to cancel rov due to being out of town. Patient is experiencing a lot of phlegm, and dry coughing. Daughter does not know if this is related to new medication he has been put on, or not. First available appt Feb. 2019. Please call to advise, and if we need to work him in sooner.

## 2017-04-10 NOTE — Telephone Encounter (Signed)
He should his MTX and fu with PCP for tt of infection.

## 2017-04-11 NOTE — Telephone Encounter (Signed)
Spoke with Alan Roman and advised that Dr. Corliss Skainseveshwar would like for the patient to hold his Methotrexate and the be seen by the PCP sooner than 04/19/17 to be treated for infection. Patient is travelling for work and will not be back in town before the weekend. Alan Roman states she will see if he might possibly be able to be seen by urgent care.

## 2017-04-12 ENCOUNTER — Ambulatory Visit: Payer: Medicare Other | Admitting: Rheumatology

## 2017-04-19 DIAGNOSIS — Z6823 Body mass index (BMI) 23.0-23.9, adult: Secondary | ICD-10-CM | POA: Diagnosis not present

## 2017-04-19 DIAGNOSIS — R5383 Other fatigue: Secondary | ICD-10-CM | POA: Diagnosis not present

## 2017-04-19 DIAGNOSIS — Z7189 Other specified counseling: Secondary | ICD-10-CM | POA: Diagnosis not present

## 2017-04-19 DIAGNOSIS — Z125 Encounter for screening for malignant neoplasm of prostate: Secondary | ICD-10-CM | POA: Diagnosis not present

## 2017-04-19 DIAGNOSIS — Z1389 Encounter for screening for other disorder: Secondary | ICD-10-CM | POA: Diagnosis not present

## 2017-04-19 DIAGNOSIS — I10 Essential (primary) hypertension: Secondary | ICD-10-CM | POA: Diagnosis not present

## 2017-04-19 DIAGNOSIS — Z0000 Encounter for general adult medical examination without abnormal findings: Secondary | ICD-10-CM | POA: Diagnosis not present

## 2017-04-19 DIAGNOSIS — Z79899 Other long term (current) drug therapy: Secondary | ICD-10-CM | POA: Diagnosis not present

## 2017-04-19 DIAGNOSIS — Z1211 Encounter for screening for malignant neoplasm of colon: Secondary | ICD-10-CM | POA: Diagnosis not present

## 2017-04-19 DIAGNOSIS — Z299 Encounter for prophylactic measures, unspecified: Secondary | ICD-10-CM | POA: Diagnosis not present

## 2017-04-20 DIAGNOSIS — I10 Essential (primary) hypertension: Secondary | ICD-10-CM | POA: Diagnosis not present

## 2017-04-20 DIAGNOSIS — M159 Polyosteoarthritis, unspecified: Secondary | ICD-10-CM | POA: Diagnosis not present

## 2017-05-07 NOTE — Progress Notes (Signed)
Office Visit Note  Patient: Alan Roman             Date of Birth: [birth year]           MRN: [id number]             PCP: Ignatius Specking, MD Referring: Ignatius Specking, MD Visit Date: 05/09/2017 Occupation: @    Subjective:  Pain hands.   History of Present Illness: Alan Roman is a 71 y.o. male with history of psoriatic arthritis and psoriasis. He was started on methotrexate last visit. He is taking methotrexate 0.6 ML subcutaneously along with Otezla 30 mg twice a day. His psoriasis cleared up. He is a still having some pain and stiffness in his hands. He describes some swelling in his right third and left fourth PIP joint. He is also having some discomfort in his toes. He's been having pain and discomfort in his right knee joint. He states he has difficulty climbing the stairs. His left totally replacement is doing well.  Activities of Daily Living:  Patient reports morning stiffness for 30 minutes.   Patient Denies nocturnal pain.  Difficulty dressing/grooming: Denies Difficulty climbing stairs: Reports Difficulty getting out of chair: Reports Difficulty using hands for taps, buttons, cutlery, and/or writing: Denies   Review of Systems  Constitutional: Negative.  Negative for fatigue, night sweats and weakness ( ).  HENT: Negative.  Negative for mouth sores, mouth dryness and nose dryness.   Eyes: Negative.  Negative for redness and dryness.  Respiratory: Negative.  Negative for shortness of breath and difficulty breathing.   Cardiovascular: Negative.  Negative for chest pain, palpitations, hypertension, irregular heartbeat and swelling in legs/feet.  Gastrointestinal: Negative.  Negative for constipation and diarrhea.  Endocrine: Negative.  Negative for increased urination.  Genitourinary: Negative.   Musculoskeletal: Positive for arthralgias, gait problem and joint pain. Negative for joint swelling, myalgias, muscle weakness, morning stiffness, muscle  tenderness and myalgias.       Has right knee pain worse after sitting for a while   Skin: Negative.  Negative for color change, rash, hair loss, nodules/bumps, skin tightness, ulcers and sensitivity to sunlight.       Skin is dry   Allergic/Immunologic: Negative.  Negative for susceptible to infections.  Neurological: Negative for dizziness, fainting, memory loss and night sweats.       Negative   Hematological: Negative.  Negative for swollen glands.  Psychiatric/Behavioral: Negative.  Negative for depressed mood and sleep disturbance. The patient is not nervous/anxious.     PMFS History:  Patient Active Problem List   Diagnosis Date Noted  . High risk medication use 12/27/2016  . History of tuberculosis 11/05/2015  . Orthostatic hypotension 11/05/2015  . Constipation 11/05/2015  . Thrombocytopenia (HCC) 11/05/2015  . Normocytic anemia 11/05/2015  . Psoriatic arthritis (HCC) 11/03/2015  . Essential hypertension 11/03/2015  . Osteoarthritis of left knee 11/03/2015  . Status post total knee replacement, left 11/03/2015  . Transaminitis 02/02/2013  . Extrapulmonary TB (tuberculosis) 02/02/2013  . Pulmonary infiltrate 01/29/2013  . Pleural effusion 01/26/2013  . Acute bronchiolitis due to other infectious organisms 01/26/2013  . Hyponatremia 01/24/2013  . Psoriasis 01/23/2013  . Fever, unknown origin 01/23/2013  . Immunocompromised state (HCC) 01/23/2013    Past Medical History:  Diagnosis Date  . Arthritis    "knees" (11/03/2015)  . History of hiatal hernia 2007; 2009  . Hypertension     No family history on file. Past Surgical History:  Procedure Laterality  Date  . CATARACT EXTRACTION W/ INTRAOCULAR LENS  IMPLANT, BILATERAL Bilateral   . HERNIA REPAIR    . HIATAL HERNIA REPAIR  2007; 2009  . JOINT REPLACEMENT    . TOTAL KNEE ARTHROPLASTY Left 11/03/2015  . TOTAL KNEE ARTHROPLASTY Left 11/03/2015   Procedure: Left Total Knee Arthroplasty;  Surgeon: Valeria Batman, MD;   Location: [facility] OR;  Service: Orthopedics;  Laterality: Left;  Marland Kitchen VIDEO BRONCHOSCOPY Bilateral 01/31/2013   Procedure: VIDEO BRONCHOSCOPY WITHOUT FLUORO;  Surgeon: Merwyn Katos, MD;  Location: [facility] ENDOSCOPY;  Service: Cardiopulmonary;  Laterality: Bilateral;   Social History   Social History Narrative  . No narrative on file     Objective: Vital Signs: BP 110/60   Pulse 74   Resp 16   Wt 154 lb (69.9 kg)   BMI 23.42 kg/m    Physical Exam  Constitutional: He is oriented to person, place, and time. He appears well-developed and well-nourished.  HENT:  Head: Normocephalic and atraumatic.  Eyes: Pupils are equal, round, and reactive to light. Conjunctivae and EOM are normal.  Neck: Normal range of motion. Neck supple.  Cardiovascular: Normal rate, regular rhythm and normal heart sounds.   Pulmonary/Chest: Effort normal and breath sounds normal.  Abdominal: Soft. Bowel sounds are normal.  Neurological: He is alert and oriented to person, place, and time.  Skin: Skin is warm and dry. Capillary refill takes less than 2 seconds.  Psychiatric: He has a normal mood and affect. His behavior is normal.  Nursing note and vitals reviewed.    Musculoskeletal Exam: C-spine and thoracic lumbar spine good range of motion. No SI joint tenderness. Shoulder joints elbow joints wrist joints are good range of motion. He had some synovitis in his PIP joints as described below. Hip joints are good range of motion. He has left total knee replacement which is doing well. His right knee joint is causing discomfort but had no warmth swelling or effusion. He has some dactylitis in his toes as described below.  CDAI Exam: CDAI Homunculus Exam:   Tenderness:  Right hand: 3rd PIP Left hand: 4th PIP Left foot: 3rd MTP, 4th MTP, 3rd PIP and 4th PIP  Swelling:  Right hand: 3rd PIP Left hand: 4th PIP Left foot: 3rd MTP, 4th MTP, 3rd PIP and 4th PIP  Joint Counts:  CDAI Tender Joint count: 2 CDAI Swollen  Joint count: 2  Global Assessments:  Patient Global Assessment: 3 Provider Global Assessment: 4  CDAI Calculated Score: 11    Investigation: No additional findings. CBC Latest Ref Rng & Units 03/17/2017 01/16/2017 12/27/2016  WBC 3.8 - 10.8 K/uL 5.6 5.8 7.0  Hemoglobin 13.2 - 17.1 g/dL 16.1 12.5(L) 13.4  Hematocrit 38.5 - 50.0 % 39.7 37.4(L) 40.2  Platelets 140 - 400 K/uL 179 219 198   CMP Latest Ref Rng & Units 03/17/2017 01/16/2017 11/06/2015  Glucose 65 - 99 mg/dL 97 096(E) 454(U)  BUN 7 - 25 mg/dL Creatinine 0.70 - 1.18 mg/dL 9.81 1.91 4.78  Sodium 135 - 146 mmol/L 137 137 135  Potassium 3.5 - 5.3 mmol/L 4.3 4.6 3.3(L)  Chloride 98 - 110 mmol/L 104 104 104  CO2 20 - 32 mmol/L Calcium 8.6 - 10.3 mg/dL 9.0 8.8 7.1(L)  Total Protein 6.1 - 8.1 g/dL 7.2 6.9 -  Total Bilirubin 0.2 - 1.2 mg/dL 0.7 0.6 -  Alkaline Phos 40 - 115 U/L 83 87 -  AST 10 - 35 U/L  24 23 -  ALT 9 - 46 U/L 15 16 -    Imaging: Xr Knee 3 View Right  Result Date: 05/09/2017 Moderate to severe medial compartment narrowing. Moderate to severe patellofemoral narrowing. Impression: Moderate to severe osteoarthritis and chondromalacia patella.   Speciality Comments: No specialty comments available.    Procedures:  No procedures performed Allergies: Patient has no known allergies.   Assessment / Plan:     Visit Diagnoses: Psoriatic arthritis (HCC): He continues to have some joint inflammation and dactylitis.. Detailed discussion regarding that. I would like to increase his methotrexate 0.8 ML subcutaneously along with otezla. Indications side effects contraindications were reviewed. He will take folic acid 2 mg by mouth daily. We will check his labs in 2 weeks, 2 months and then every 3 months with labs are stable.  Other psoriasis - Clobetasol 0.05% topical cream when necessary. His rash has cleared up after adding methotrexate.  High risk medication use - Otezla 30 mg by mouth twice a day,  MTX 0.6 mL subcutaneous every week, folic acid 1 mg by mouth daily. His labs have been stable.  Essential hypertension: His blood pressure is stable.  Chronic pain of right knee - Plan: XR KNEE 3 VIEW RIGHT: He has moderate to severe osteoarthritis and chondromalacia patella. He would like to have right total knee replacement in future. I've advised him to schedule an appointment with Dr. Cleophas Dunker when ready.  Status post total knee replacement, left: Doing well.  History of tuberculosis: Treated in the past.    Orders: Orders Placed This Encounter  Procedures  . XR KNEE 3 VIEW RIGHT   Meds ordered this encounter  Medications  . methotrexate 50 MG/2ML injection    Sig: Inject 0.8 mLs (20 mg total) into the skin once a week.    Dispense:  10 mL    Refill:  0     Follow-Up Instructions: Return in about 3 months (around 08/09/2017) for Psoriatic arthritis, psoriasis, osteoarthritis.   Pollyann Savoy, MD  Note - This record has been created using Animal nutritionist.  Chart creation errors have been sought, but may not always  have been located. Such creation errors do not reflect on  the standard of medical care.

## 2017-05-09 ENCOUNTER — Encounter: Payer: Self-pay | Admitting: Rheumatology

## 2017-05-09 ENCOUNTER — Ambulatory Visit (INDEPENDENT_AMBULATORY_CARE_PROVIDER_SITE_OTHER): Payer: Medicare Other | Admitting: Rheumatology

## 2017-05-09 ENCOUNTER — Ambulatory Visit (INDEPENDENT_AMBULATORY_CARE_PROVIDER_SITE_OTHER): Payer: Medicare Other

## 2017-05-09 VITALS — BP 110/60 | HR 74 | Resp 16 | Wt 154.0 lb

## 2017-05-09 DIAGNOSIS — M25561 Pain in right knee: Secondary | ICD-10-CM

## 2017-05-09 DIAGNOSIS — G8929 Other chronic pain: Secondary | ICD-10-CM

## 2017-05-09 DIAGNOSIS — L408 Other psoriasis: Secondary | ICD-10-CM | POA: Diagnosis not present

## 2017-05-09 DIAGNOSIS — Z96652 Presence of left artificial knee joint: Secondary | ICD-10-CM

## 2017-05-09 DIAGNOSIS — I10 Essential (primary) hypertension: Secondary | ICD-10-CM | POA: Diagnosis not present

## 2017-05-09 DIAGNOSIS — L4050 Arthropathic psoriasis, unspecified: Secondary | ICD-10-CM | POA: Diagnosis not present

## 2017-05-09 DIAGNOSIS — Z8611 Personal history of tuberculosis: Secondary | ICD-10-CM | POA: Diagnosis not present

## 2017-05-09 DIAGNOSIS — Z79899 Other long term (current) drug therapy: Secondary | ICD-10-CM | POA: Diagnosis not present

## 2017-05-09 MED ORDER — METHOTREXATE SODIUM CHEMO INJECTION 50 MG/2ML: 20.0000 mg | INTRAMUSCULAR | 0 refills | Status: DC

## 2017-05-09 NOTE — Patient Instructions (Signed)
Increase the Methotrexate to 0.100mL once a week, use Folic acid 2 per day  Standing Labs We placed an order today for your standing lab work.    Please come back and get your standing labs in 2 weeks, then 2 months, then every 3 months   We have open lab Monday through Friday from 8:30-11:30 AM and 1:30-4 PM at the office of Dr. Pollyann Savoy.   The office is located at [address], [address], [place], [place] [address] No appointment is necessary.   Labs are drawn by [facility].  You may receive a bill from [facility] for your lab work. If you have any questions regarding directions or hours of operation,  please call [phone].

## 2017-05-12 DIAGNOSIS — M159 Polyosteoarthritis, unspecified: Secondary | ICD-10-CM | POA: Diagnosis not present

## 2017-05-12 DIAGNOSIS — I10 Essential (primary) hypertension: Secondary | ICD-10-CM | POA: Diagnosis not present

## 2017-05-29 ENCOUNTER — Other Ambulatory Visit: Payer: Self-pay | Admitting: Rheumatology

## 2017-05-29 NOTE — Telephone Encounter (Signed)
Last Visit: 05/09/17 Next Visit: due January 2019. Message sent to the front to schedule patient   Okay to refill per Dr. Corliss Skainseveshwar

## 2017-05-30 DIAGNOSIS — Z23 Encounter for immunization: Secondary | ICD-10-CM | POA: Diagnosis not present

## 2017-06-05 DIAGNOSIS — I10 Essential (primary) hypertension: Secondary | ICD-10-CM | POA: Diagnosis not present

## 2017-06-05 DIAGNOSIS — M159 Polyosteoarthritis, unspecified: Secondary | ICD-10-CM | POA: Diagnosis not present

## 2017-06-06 DIAGNOSIS — Z6823 Body mass index (BMI) 23.0-23.9, adult: Secondary | ICD-10-CM | POA: Diagnosis not present

## 2017-06-06 DIAGNOSIS — J069 Acute upper respiratory infection, unspecified: Secondary | ICD-10-CM | POA: Diagnosis not present

## 2017-06-06 DIAGNOSIS — M1990 Unspecified osteoarthritis, unspecified site: Secondary | ICD-10-CM | POA: Diagnosis not present

## 2017-06-06 DIAGNOSIS — I10 Essential (primary) hypertension: Secondary | ICD-10-CM | POA: Diagnosis not present

## 2017-06-06 DIAGNOSIS — Z299 Encounter for prophylactic measures, unspecified: Secondary | ICD-10-CM | POA: Diagnosis not present

## 2017-06-21 ENCOUNTER — Telehealth: Payer: Self-pay | Admitting: Rheumatology

## 2017-06-21 NOTE — Telephone Encounter (Signed)
-----   Message from Henriette CombsAndrea L Hatton, LPN sent at 16/10/960410/29/2018 10:14 AM EDT ----- Regarding: Please schedule patient for follow up appointment.  Please schedule patient for follow up appointment. Patient is due January 2019. Thanks!

## 2017-06-21 NOTE — Telephone Encounter (Signed)
I LMOM for patient to call, and schedule a follow up appt.

## 2017-06-26 ENCOUNTER — Telehealth: Payer: Self-pay | Admitting: Pharmacist

## 2017-06-26 NOTE — Telephone Encounter (Signed)
Okay to resume Otezla at 30 mg by mouth twice a day.

## 2017-06-26 NOTE — Telephone Encounter (Signed)
Patient previously initiated on Mauritaniatezla and had to stop for a period of time. Patient is wondering if he can re-start maintenance dosing (30mg  BID) or if he needs to repeat the starter pack dose titration. The starter pack dosing schedule is to reduce gastrointestinal distress (nausea, vomiting, diarrhea).   Essie HartJulie E Saunders, [facility] 3:51 PM

## 2017-06-26 NOTE — Telephone Encounter (Signed)
Spoke with patient. It has 10 days since last dose of Otezla.

## 2017-06-27 NOTE — Telephone Encounter (Signed)
Advised patient that he does not need to re-initiate on Otezla starter pack, but may resume the 30mg  BID dosing. Patient states he is getting frequent headaches with this dose. Advised patient to take medication with food and to monitor headaches closely. If they continue to be a problem, to please call and let us know. Patient voiced understanding and plans to resume Otezla 30mg  BID dosing this evening.

## 2017-07-24 DIAGNOSIS — I10 Essential (primary) hypertension: Secondary | ICD-10-CM | POA: Diagnosis not present

## 2017-07-24 DIAGNOSIS — M159 Polyosteoarthritis, unspecified: Secondary | ICD-10-CM | POA: Diagnosis not present

## 2017-07-28 ENCOUNTER — Telehealth: Payer: Self-pay

## 2017-07-28 NOTE — Telephone Encounter (Signed)
[facility] notification [facility] that pts authorization will expire on 07/31/17. A prior authorization for [place] has been submitted to pts insurance via phone. Will update once we receive a response.   Zelphia Glover, [place], CPhT 9:15 AM

## 2017-07-31 NOTE — Telephone Encounter (Signed)
[facility] a prior authorization approval for OTEZLA through 07/31/2018.   Reference number: [id number] Phone number: [phone]  Will send document to scan center.  Kedra Mcglade, [place], CPhT 10:33 AM

## 2017-08-07 IMAGING — CR DG CHEST 2V
2 series · 2 of 2 positions shown · non-contrast
Comparison: 09/05/2013.

CLINICAL DATA: Arthroplasty.  Preoperative study.

EXAM:
CHEST  2 VIEW

[w chest pa]
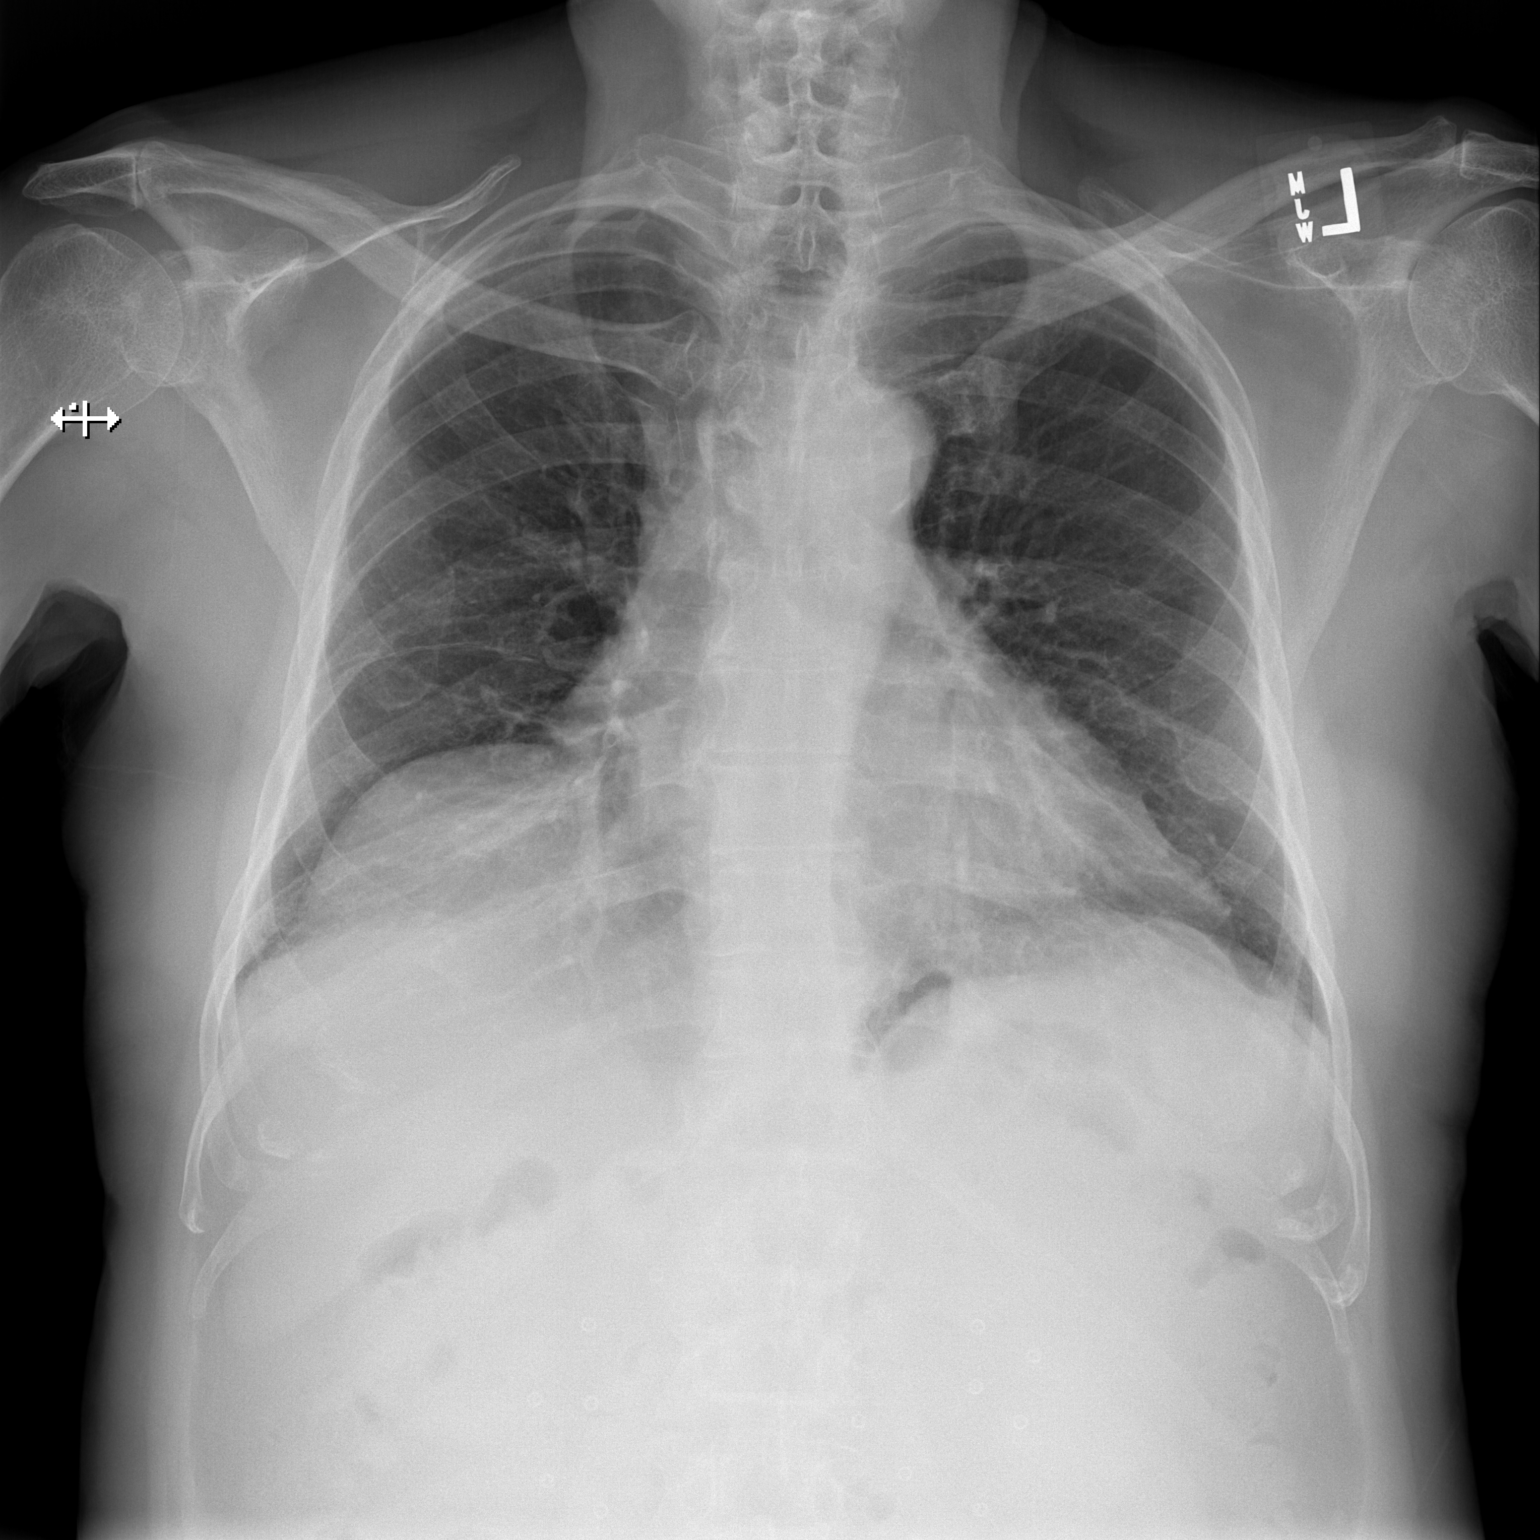

[w chest lat]
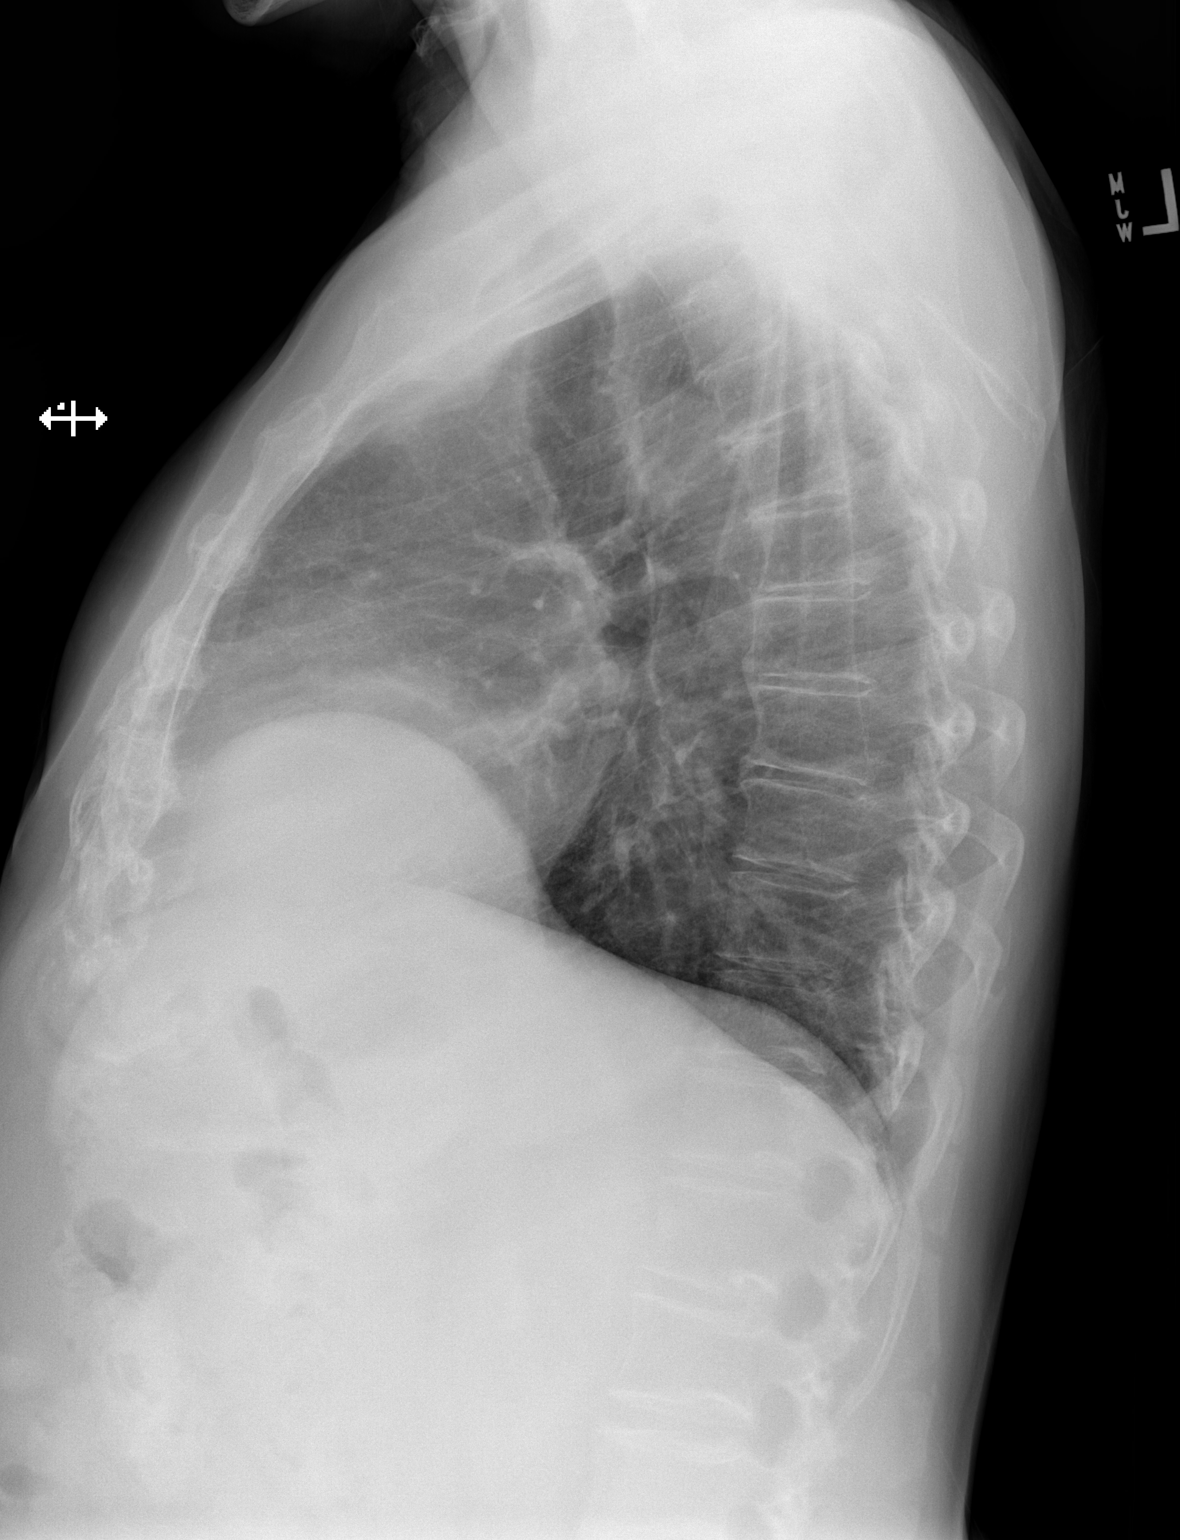

[2 of 2 positions shown; findings below may reference images not displayed]

FINDINGS: Mediastinum hilar structures normal. Low lung volumes with mild
basilar atelectasis. No focal infiltrate. No pleural effusion or
pneumothorax. Mild cardiomegaly. Normal pulmonary vascularity .
Degenerative changes thoracic spine .
IMPRESSION: 1.  Low lung volumes.  No acute pulmonary disease.

2.  Mild cardiomegaly.  No pulmonary venous congestion .

## 2017-08-08 DIAGNOSIS — M159 Polyosteoarthritis, unspecified: Secondary | ICD-10-CM | POA: Diagnosis not present

## 2017-08-08 DIAGNOSIS — I10 Essential (primary) hypertension: Secondary | ICD-10-CM | POA: Diagnosis not present

## 2017-08-15 NOTE — Progress Notes (Deleted)
Office Visit Note  Patient: Alan Roman             Date of Birth: [birth year]           MRN: [id number]             PCP: Ignatius Specking, MD Referring: Ignatius Specking, MD Visit Date: 08/28/2017 Occupation: @GUAROCC @    Subjective:  No chief complaint on file.   History of Present Illness: Alan Roman is a 72 y.o. male ***   Activities of Daily Living:  Patient reports morning stiffness for *** {minute/hour:19697}.   Patient {ACTIONS;DENIES/REPORTS:21021675::"Denies"} nocturnal pain.  Difficulty dressing/grooming: {ACTIONS;DENIES/REPORTS:21021675::"Denies"} Difficulty climbing stairs: {ACTIONS;DENIES/REPORTS:21021675::"Denies"} Difficulty getting out of chair: {ACTIONS;DENIES/REPORTS:21021675::"Denies"} Difficulty using hands for taps, buttons, cutlery, and/or writing: {ACTIONS;DENIES/REPORTS:21021675::"Denies"}   No Rheumatology ROS completed.   PMFS History:  Patient Active Problem List   Diagnosis Date Noted  . High risk medication use 12/27/2016  . History of tuberculosis 11/05/2015  . Orthostatic hypotension 11/05/2015  . Constipation 11/05/2015  . Thrombocytopenia (HCC) 11/05/2015  . Normocytic anemia 11/05/2015  . Psoriatic arthritis (HCC) 11/03/2015  . Essential hypertension 11/03/2015  . Osteoarthritis of left knee 11/03/2015  . Status post total knee replacement, left 11/03/2015  . Transaminitis 02/02/2013  . Extrapulmonary TB (tuberculosis) 02/02/2013  . Pulmonary infiltrate 01/29/2013  . Pleural effusion 01/26/2013  . Acute bronchiolitis due to other infectious organisms 01/26/2013  . Hyponatremia 01/24/2013  . Psoriasis 01/23/2013  . Fever, unknown origin 01/23/2013  . Immunocompromised state (HCC) 01/23/2013    Past Medical History:  Diagnosis Date  . Arthritis    "knees" (11/03/2015)  . History of hiatal hernia 2007; 2009  . Hypertension     No family history on file. Past Surgical History:  Procedure Laterality Date  . CATARACT  EXTRACTION W/ INTRAOCULAR LENS  IMPLANT, BILATERAL Bilateral   . HERNIA REPAIR    . HIATAL HERNIA REPAIR  2007; 2009  . JOINT REPLACEMENT    . TOTAL KNEE ARTHROPLASTY Left 11/03/2015  . TOTAL KNEE ARTHROPLASTY Left 11/03/2015   Procedure: Left Total Knee Arthroplasty;  Surgeon: Valeria Batman, MD;  Location: [facility] OR;  Service: Orthopedics;  Laterality: Left;  Marland Kitchen VIDEO BRONCHOSCOPY Bilateral 01/31/2013   Procedure: VIDEO BRONCHOSCOPY WITHOUT FLUORO;  Surgeon: Merwyn Katos, MD;  Location: [facility] ENDOSCOPY;  Service: Cardiopulmonary;  Laterality: Bilateral;   Social History   Social History Narrative  . Not on file     Objective: Vital Signs: There were no vitals taken for this visit.   Physical Exam   Musculoskeletal Exam: ***  CDAI Exam: No CDAI exam completed.    Investigation: No additional findings. CBC Latest Ref Rng & Units 03/17/2017 01/16/2017 12/27/2016  WBC 3.8 - 10.8 K/uL 5.6 5.8 7.0  Hemoglobin 13.2 - 17.1 g/dL 11.9 12.5(L) 13.4  Hematocrit 38.5 - 50.0 % 39.7 37.4(L) 40.2  Platelets 140 - 400 K/uL 179 219 198   CMP Latest Ref Rng & Units 03/17/2017 01/16/2017 11/06/2015  Glucose 65 - 99 mg/dL 97 147(W) 295(A)  BUN 7 - 25 mg/dL 19 20 8   Creatinine 0.70 - 1.18 mg/dL 2.13 0.86 5.78  Sodium 135 - 146 mmol/L 137 137 135  Potassium 3.5 - 5.3 mmol/L 4.3 4.6 3.3(L)  Chloride 98 - 110 mmol/L 104 104 104  CO2 20 - 32 mmol/L 20 26 22   Calcium 8.6 - 10.3 mg/dL 9.0 8.8 7.1(L)  Total Protein 6.1 - 8.1 g/dL 7.2 6.9 -  Total Bilirubin 0.2 - 1.2 mg/dL  0.7 0.6 -  Alkaline Phos 40 - 115 U/L 83 87 -  AST 10 - 35 U/L 24 23 -  ALT 9 - 46 U/L 15 16 -    Imaging: No results found.  Speciality Comments: No specialty comments available.    Procedures:  No procedures performed Allergies: Patient has no known allergies.   Assessment / Plan:     Visit Diagnoses: No diagnosis found.    Orders: No orders of the defined types were placed in this encounter.  No orders of the defined  types were placed in this encounter.   Face-to-face time spent with patient was *** minutes. 50% of time was spent in counseling and coordination of care.  Follow-Up Instructions: No Follow-up on file.   Ellen HenriMarissa C Talia Hoheisel, CMA  Note - This record has been created using Animal nutritionistDragon software.  Chart creation errors have been sought, but may not always  have been located. Such creation errors do not reflect on  the standard of medical care.

## 2017-08-16 ENCOUNTER — Other Ambulatory Visit: Payer: Self-pay | Admitting: Rheumatology

## 2017-08-16 NOTE — Telephone Encounter (Signed)
Last visit: 05/28/17 Next Visit:: 08/28/17  Okay to refill per Dr. Corliss Skainseveshwar

## 2017-08-24 ENCOUNTER — Encounter: Payer: Self-pay | Admitting: Rheumatology

## 2017-08-24 ENCOUNTER — Ambulatory Visit (INDEPENDENT_AMBULATORY_CARE_PROVIDER_SITE_OTHER): Payer: Medicare Other | Admitting: Rheumatology

## 2017-08-24 ENCOUNTER — Telehealth: Payer: Self-pay

## 2017-08-24 VITALS — BP 99/61 | HR 72 | Resp 15 | Ht 68.0 in | Wt 156.0 lb

## 2017-08-24 DIAGNOSIS — Z79899 Other long term (current) drug therapy: Secondary | ICD-10-CM

## 2017-08-24 DIAGNOSIS — L409 Psoriasis, unspecified: Secondary | ICD-10-CM

## 2017-08-24 DIAGNOSIS — D696 Thrombocytopenia, unspecified: Secondary | ICD-10-CM | POA: Diagnosis not present

## 2017-08-24 DIAGNOSIS — M1711 Unilateral primary osteoarthritis, right knee: Secondary | ICD-10-CM | POA: Diagnosis not present

## 2017-08-24 DIAGNOSIS — Z9225 Personal history of immunosupression therapy: Secondary | ICD-10-CM

## 2017-08-24 DIAGNOSIS — L4050 Arthropathic psoriasis, unspecified: Secondary | ICD-10-CM | POA: Diagnosis not present

## 2017-08-24 DIAGNOSIS — Z8679 Personal history of other diseases of the circulatory system: Secondary | ICD-10-CM

## 2017-08-24 DIAGNOSIS — Z96652 Presence of left artificial knee joint: Secondary | ICD-10-CM

## 2017-08-24 DIAGNOSIS — Z8611 Personal history of tuberculosis: Secondary | ICD-10-CM | POA: Diagnosis not present

## 2017-08-24 NOTE — Telephone Encounter (Signed)
Spoke with pt at visit today. He express concern about the cost of Mauritaniatezla. Called pts insurance to find the following:  He is currently in the initial stage of coverage with [employer]. He will be required to pay %50 of the cost of medication for a Tier 4 drug [place](Otezla [phone]) until he reaches $3820.  Gap coverage: pt will pay 25% of the cost of the medication until he reaches $3,820.  Catastrophic: pt will pay $8.50 or 5% of the cost of the medication.   Pt has enough Otezla to last him through February 2019. He would like to change medication due to price. His preferred medications include: Humira, Enbrel and Cosentyx. He has tried and failed Humira and Enbrel. Would you like to apply for Cosentyx or another medication? Thanks!  Bernell Haynie, [place], CPhT 4:10 PM

## 2017-08-24 NOTE — Patient Instructions (Addendum)
Standing Labs We placed an order today for your standing lab work.    Please come back and get your standing labs in May and every 3 months  We have open lab Monday through Friday from 8:30-11:30 AM and 1:30-4 PM at the office of Dr. Cartier Mapel.   The office is located at [address], [address], [place],  [address] No appointment is necessary.   Labs are drawn by [facility].  You may receive a bill from [facility] for your lab work. If you have any questions regarding directions or hours of operation,  please call [phone].    

## 2017-08-24 NOTE — Progress Notes (Signed)
Office Visit Note  Patient: Alan Roman             Date of Birth: 1946-02-06           MRN: [id number]             PCP: Ignatius Specking, MD Referring: Ignatius Specking, MD Visit Date: 08/24/2017 Occupation: @GUAROCC @    Subjective:  Other (right knee pain, wants to discuss meds )   History of Present Illness: Alan Roman is a 72 y.o. male with history of psoriatic arthritis and psoriasis. He states she's been having discomfort in his right knee joint. His symptoms are very well controlled without otezla. He denies any psoriasis flare. His left total knee replacement is doing well.  Activities of Daily Living:  Patient reports morning stiffness for 2 minutes.   Patient Denies nocturnal pain.  Difficulty dressing/grooming: Denies Difficulty climbing stairs: Reports Difficulty getting out of chair: Reports Difficulty using hands for taps, buttons, cutlery, and/or writing: Denies   Review of Systems  Constitutional: Negative for fatigue, night sweats and weakness ( ).  HENT: Negative for mouth sores, mouth dryness and nose dryness.   Eyes: Negative for redness and dryness.  Respiratory: Negative for shortness of breath and difficulty breathing.   Cardiovascular: Negative for chest pain, palpitations, hypertension, irregular heartbeat and swelling in legs/feet.  Gastrointestinal: Negative for constipation and diarrhea.  Endocrine: Negative for increased urination.  Musculoskeletal: Positive for arthralgias, joint pain and morning stiffness. Negative for joint swelling, myalgias, muscle weakness, muscle tenderness and myalgias.  Skin: Negative for color change, rash, hair loss, nodules/bumps, skin tightness, ulcers and sensitivity to sunlight.  Allergic/Immunologic: Negative for susceptible to infections.  Neurological: Negative for dizziness, fainting, memory loss and night sweats.  Hematological: Negative for swollen glands.  Psychiatric/Behavioral: Negative for depressed  mood and sleep disturbance. The patient is not nervous/anxious.     PMFS History:  Patient Active Problem List   Diagnosis Date Noted  . High risk medication use 12/27/2016  . History of tuberculosis 11/05/2015  . Orthostatic hypotension 11/05/2015  . Constipation 11/05/2015  . Thrombocytopenia (HCC) 11/05/2015  . Normocytic anemia 11/05/2015  . Psoriatic arthritis (HCC) 11/03/2015  . Essential hypertension 11/03/2015  . Osteoarthritis of left knee 11/03/2015  . Status post total knee replacement, left 11/03/2015  . Transaminitis 02/02/2013  . Extrapulmonary TB (tuberculosis) 02/02/2013  . Pulmonary infiltrate 01/29/2013  . Pleural effusion 01/26/2013  . Acute bronchiolitis due to other infectious organisms 01/26/2013  . Hyponatremia 01/24/2013  . Psoriasis 01/23/2013  . Fever, unknown origin 01/23/2013  . Immunocompromised state (HCC) 01/23/2013    Past Medical History:  Diagnosis Date  . Arthritis    "knees" (11/03/2015)  . History of hiatal hernia 2007; 2009  . Hypertension     History reviewed. No pertinent family history. Past Surgical History:  Procedure Laterality Date  . CATARACT EXTRACTION W/ INTRAOCULAR LENS  IMPLANT, BILATERAL Bilateral   . HERNIA REPAIR    . HIATAL HERNIA REPAIR  2007; 2009  . JOINT REPLACEMENT    . TOTAL KNEE ARTHROPLASTY Left 11/03/2015  . TOTAL KNEE ARTHROPLASTY Left 11/03/2015   Procedure: Left Total Knee Arthroplasty;  Surgeon: Valeria Batman, MD;  Location: [facility] OR;  Service: Orthopedics;  Laterality: Left;  Marland Kitchen VIDEO BRONCHOSCOPY Bilateral 01/31/2013   Procedure: VIDEO BRONCHOSCOPY WITHOUT FLUORO;  Surgeon: Merwyn Katos, MD;  Location: [facility] ENDOSCOPY;  Service: Cardiopulmonary;  Laterality: Bilateral;   Social History   Social History Narrative  .  Not on file     Objective: Vital Signs: BP 99/61 (BP Location: Left Arm, Patient Position: Sitting, Cuff Size: Normal)   Pulse 72   Resp 15   Ht 5\' 8"  (1.727 m)   Wt 156 lb (70.8 kg)    BMI 23.72 kg/m    Physical Exam  Constitutional: He is oriented to person, place, and time. He appears well-developed and well-nourished.  HENT:  Head: Normocephalic and atraumatic.  Eyes: Conjunctivae and EOM are normal. Pupils are equal, round, and reactive to light.  Neck: Normal range of motion. Neck supple.  Cardiovascular: Normal rate, regular rhythm and normal heart sounds.  Pulmonary/Chest: Effort normal and breath sounds normal.  Abdominal: Soft. Bowel sounds are normal.  Neurological: He is alert and oriented to person, place, and time.  Skin: Skin is warm and dry. Capillary refill takes less than 2 seconds.  Hyperpigmentation from prior psoriasis rash over extremities and trunk.  Psychiatric: He has a normal mood and affect. His behavior is normal.  Nursing note and vitals reviewed.    Musculoskeletal Exam: C-spine and thoracic lumbar spine good range of motion. Shoulder joints elbow joints wrist joints are good range of motion. He has DIP PIP thickening with no synovitis. Hip joints with good range of motion. He has discomfort range of motion of his right knee without any warmth swelling or effusion. Left total knee replacement is doing well. There was no synovitis of her ankles or feet.  CDAI Exam: CDAI Homunculus Exam:   Joint Counts:  CDAI Tender Joint count: 0 CDAI Swollen Joint count: 0  Global Assessments:  Patient Global Assessment: 4 Provider Global Assessment: 2  CDAI Calculated Score: 6    Investigation: No additional findings. CBC Latest Ref Rng & Units 03/17/2017 01/16/2017 12/27/2016  WBC 3.8 - 10.8 K/uL 5.6 5.8 7.0  Hemoglobin 13.2 - 17.1 g/dL 16.1 12.5(L) 13.4  Hematocrit 38.5 - 50.0 % 39.7 37.4(L) 40.2  Platelets 140 - 400 K/uL 179 219 198   CMP Latest Ref Rng & Units 03/17/2017 01/16/2017 11/06/2015  Glucose 65 - 99 mg/dL 97 096(E) 454(U)  BUN 7 - 25 mg/dL 19 20 8   Creatinine 0.70 - 1.18 mg/dL 9.81 1.91 4.78  Sodium 135 - 146 mmol/L 137 137 135    Potassium 3.5 - 5.3 mmol/L 4.3 4.6 3.3(L)  Chloride 98 - 110 mmol/L 104 104 104  CO2 20 - 32 mmol/L 20 26 22   Calcium 8.6 - 10.3 mg/dL 9.0 8.8 7.1(L)  Total Protein 6.1 - 8.1 g/dL 7.2 6.9 -  Total Bilirubin 0.2 - 1.2 mg/dL 0.7 0.6 -  Alkaline Phos 40 - 115 U/L 83 87 -  AST 10 - 35 U/L 24 23 -  ALT 9 - 46 U/L 15 16 -    Imaging: No results found.  Speciality Comments: No specialty comments available.    Procedures:  No procedures performed Allergies: Patient has no known allergies.   Assessment / Plan:     Visit Diagnoses: Psoriatic arthritis (HCC): His psoriatic arthritis has been well controlled with methotrexate combination. He is concerned about the cost of otezla.   Psoriasis - Clobetasol 0.05% topical cream when necessary. He did not have to use much clobetasol as rash is quite well controlled with combination therapy.  High risk medication use - Otezla 30 mg po bid, MTX 0.6 ml sq qwk, folic acid 1mg  po qd - Plan: CBC with Differential/Platelet, COMPLETE METABOLIC PANEL WITH GFR. His labs are past-due we will check  labs today and then every 3 months to monitor for drug toxicity. Need for monitoring labs on regular basis was emphasized.  Status post total knee replacement, left: Doing well  Primary osteoarthritis of right knee: He has severe osteoarthritis in his knee joint which is causing discomfort. We discussed possible total knee replacement in future.  History of tuberculosis: Treated in the past.   History of hypertension: His with pressure is well controlled.  Thrombocytopenia (HCC) : His platelets have been stable lately.  I have advised him to discuss scheduling DEXA scan with his PCP.   Orders: Orders Placed This Encounter  Procedures  . CBC with Differential/Platelet  . COMPLETE METABOLIC PANEL WITH GFR   No orders of the defined types were placed in this encounter.   Face-to-face time spent with patient was 30 minutes. Greater than 50% of time was  spent in counseling and coordination of care.  Follow-Up Instructions: Return in about 5 months (around 01/22/2018) for Psoriatic arthritis, Osteoarthritis.   Pollyann SavoyShaili Shaheer Bonfield, MD  Note - This record has been created using Animal nutritionistDragon software.  Chart creation errors have been sought, but may not always  have been located. Such creation errors do not reflect on  the standard of medical care.

## 2017-08-25 ENCOUNTER — Telehealth: Payer: Self-pay

## 2017-08-25 LAB — COMPLETE METABOLIC PANEL WITH GFR
AG Ratio: 1.4 (calc) (ref 1.0–2.5)
ALT: 17 U/L (ref 9–46)
AST: 24 U/L (ref 10–35)
Albumin: 4.1 g/dL (ref 3.6–5.1)
Alkaline phosphatase (APISO): 87 U/L (ref 40–115)
BUN: 17 mg/dL (ref 7–25)
CO2: 28 mmol/L (ref 20–32)
Calcium: 8.8 mg/dL (ref 8.6–10.3)
Chloride: 104 mmol/L (ref 98–110)
Creat: 1.07 mg/dL (ref 0.70–1.18)
GFR, Est African American: 80 mL/min/{1.73_m2} (ref >=60)
GFR, Est Non African American: 69 mL/min/{1.73_m2} (ref >=60)
Globulin: 2.9 g/dL (calc) (ref 1.9–3.7)
Glucose, Bld: 96 mg/dL (ref 65–99)
Potassium: 4.8 mmol/L (ref 3.5–5.3)
Sodium: 138 mmol/L (ref 135–146)
Total Bilirubin: 0.6 mg/dL (ref 0.2–1.2)
Total Protein: 7 g/dL (ref 6.1–8.1)

## 2017-08-25 LAB — CBC WITH DIFFERENTIAL/PLATELET
Basophils Absolute: 22 cells/uL (ref 0–200)
Basophils Relative: 0.4 %
Eosinophils Absolute: 151 cells/uL (ref 15–500)
Eosinophils Relative: 2.8 %
HCT: 40.3 % (ref 38.5–50.0)
Hemoglobin: 13.8 g/dL (ref 13.2–17.1)
Lymphs Abs: 1388 cells/uL (ref 850–3900)
MCH: 31.9 pg (ref 27.0–33.0)
MCHC: 34.2 g/dL (ref 32.0–36.0)
MCV: 93.3 fL (ref 80.0–100.0)
MPV: 10.9 fL (ref 7.5–12.5)
Monocytes Relative: 11.9 %
Neutro Abs: 3197 cells/uL (ref 1500–7800)
Neutrophils Relative %: 59.2 %
Platelets: 177 10*3/uL (ref 140–400)
RBC: 4.32 10*6/uL (ref 4.20–5.80)
RDW: 12.5 % (ref 11.0–15.0)
Total Lymphocyte: 25.7 %
WBC mixed population: 643 cells/uL (ref 200–950)
WBC: 5.4 10*3/uL (ref 3.8–10.8)

## 2017-08-25 NOTE — Progress Notes (Signed)
WNL

## 2017-08-25 NOTE — Telephone Encounter (Signed)
Okay to apply for Cosyntex. He will need a visit to discuss the medication. He will need yearly chest x-ray.

## 2017-08-25 NOTE — Telephone Encounter (Signed)
Completed a BIV through Medicare.gov for pts Cosentyx. Medication is on the preferred list. Cost is still expensive (Initial coverage $1,633, GAP coverage $1,237 and Catastrophic coverage $247)   Call pt to update. Left message.  Calani Gick, [place], CPhT 3:11 PM

## 2017-08-28 ENCOUNTER — Ambulatory Visit: Payer: Medicare Other | Admitting: Rheumatology

## 2017-08-29 ENCOUNTER — Ambulatory Visit (HOSPITAL_COMMUNITY)
Admission: RE | Admit: 2017-08-29 | Discharge: 2017-08-29 | Disposition: A | Discharge status: Home / Self Care | Payer: Medicare Other | Source: Ambulatory Visit | Attending: Rheumatology | Admitting: Rheumatology

## 2017-08-29 ENCOUNTER — Telehealth: Payer: Self-pay | Admitting: Rheumatology

## 2017-08-29 DIAGNOSIS — Z9225 Personal history of immunosupression therapy: Secondary | ICD-10-CM | POA: Diagnosis not present

## 2017-08-29 DIAGNOSIS — J9811 Atelectasis: Secondary | ICD-10-CM | POA: Diagnosis not present

## 2017-08-29 NOTE — Telephone Encounter (Signed)
Patient has been advised he will need a chest x-ray. Order has been placed and patient will go today. Patient has been scheduled for 09/04/17 at 10:20 am for an appointment to discuss and consent on cosentyx.

## 2017-08-29 NOTE — Telephone Encounter (Signed)
Returned pts call. He spoke with [employer] to get information about Cosentyx. He understands the medication will be high cost. He was told that it would be cheaper if he uses [facility]. I will compete a prior authorization for Cosnetyx. One approved, we will [facility] to have the first dose mailed to the clinic. Once received we will schedule a nursing visit. Patient voices understanding and denies any questions at this time.   Call was transferred to Alan FannyAndrea Hatton, LPN  Alan Roman, [place], CPhT 9:57 AM

## 2017-08-29 NOTE — Telephone Encounter (Signed)
Patient left a voicemail returning your call.  CB# [phone]

## 2017-08-30 NOTE — Telephone Encounter (Signed)
Received confirmation from Cover My Meds stating that Cosentyx has been partially approved. Called [employer] to clarify. Spoke with representative who states that the authorization has been approved for 30 days at a time. The authorization was submitted for a 35 days. (This was done by accident) No new authorization will be needed. Patient can get a 30 days supply from 08/30/2017 through 08/31/2019.   Authorization: [id number] Phone: [phone]  Will send document to scan center.  Called pt to update. His copay for the loading dose is $2400. We will provide him with 2 sample boxed from the clinic. He will continue to use [facility]. He currently has about one month left of Otezla. He would like to start Cosentyx after he is finished around March 1st. He will call us when he is down to 1 week left of [place] so that we can schedule a nursing visit for his Cosentyx. He will need a maintenance does RX sent to [facility] during that time. Thanks!  Saiya Crist, [place], CPhT 9:04 AM

## 2017-09-01 NOTE — Progress Notes (Signed)
Office Visit Note  Patient: Alan Roman             Date of Birth: Jan 10, 1946           MRN: [id number]             PCP: Ignatius Specking, MD Referring: Ignatius Specking, MD Visit Date: 09/04/2017 Occupation: @GUAROCC @    Subjective:  Discuss medication   History of Present Illness: Alan Roman is a 72 y.o. male with history of psoriatic arthritis.  Patient states he is going to stay on [place] until the end of February.  He is also going to continue on MTX 0.6 ml weekly and folic acid 2 mg daily.  He would like to start Cosentyx due to the cost of [place].  Patient states that his psoriasis is 95% cleared.  He has some patches on his bilateral lower legs.  He denies any joint pain or joint swelling at this time.  He denies any morning stiffness in his joints.  He denies any achilles tendonitis or plantar fasciitis.  Patient states that his right knee causes occasional discomfort.  He wears a sleeve over his right knee, which helps with stability.  He would like to have his right knee replaced this summer.  He states his left knee, which was replaced by Dr. Cleophas Dunker is doing well.   He denies a history of ulcerative colitis, Crohn's disease, or diverticulitis.  He had a colonoscopy performed by Dr. Loreta Roman.     Activities of Daily Living:  Patient reports morning stiffness for 0 minutes.   Patient Denies nocturnal pain.  Difficulty dressing/grooming: Denies Difficulty climbing stairs: Denies Difficulty getting out of chair: Denies Difficulty using hands for taps, buttons, cutlery, and/or writing: Denies   Review of Systems  Constitutional: Negative for fatigue, night sweats and weakness.  HENT: Negative for mouth sores, mouth dryness and nose dryness.   Eyes: Positive for dryness. Negative for redness.  Respiratory: Negative for cough, hemoptysis, shortness of breath and difficulty breathing.   Cardiovascular: Negative for chest pain, palpitations, hypertension, irregular  heartbeat and swelling in legs/feet.  Gastrointestinal: Negative for blood in stool, constipation and diarrhea.  Endocrine: Negative.  Negative for increased urination.  Genitourinary: Negative for painful urination.  Musculoskeletal: Negative for arthralgias, joint pain, joint swelling, myalgias, muscle weakness, morning stiffness, muscle tenderness and myalgias.  Skin: Positive for rash (Psorasis on legs ). Negative for color change, hair loss, nodules/bumps, redness, skin tightness, ulcers and sensitivity to sunlight.  Allergic/Immunologic: Negative for susceptible to infections.  Neurological: Negative for dizziness, fainting, memory loss and night sweats.  Hematological: Negative for swollen glands.  Psychiatric/Behavioral: Negative for depressed mood and sleep disturbance. The patient is not nervous/anxious.     PMFS History:  Patient Active Problem List   Diagnosis Date Noted  . High risk medication use 12/27/2016  . History of tuberculosis 11/05/2015  . Orthostatic hypotension 11/05/2015  . Constipation 11/05/2015  . Thrombocytopenia (HCC) 11/05/2015  . Normocytic anemia 11/05/2015  . Psoriatic arthritis (HCC) 11/03/2015  . Essential hypertension 11/03/2015  . Osteoarthritis of left knee 11/03/2015  . Status post total knee replacement, left 11/03/2015  . Transaminitis 02/02/2013  . Extrapulmonary TB (tuberculosis) 02/02/2013  . Pulmonary infiltrate 01/29/2013  . Pleural effusion 01/26/2013  . Acute bronchiolitis due to other infectious organisms 01/26/2013  . Hyponatremia 01/24/2013  . Psoriasis 01/23/2013  . Fever, unknown origin 01/23/2013  . Immunocompromised state (HCC) 01/23/2013    Past Medical History:  Diagnosis  Date  . Arthritis    "knees" (11/03/2015)  . History of hiatal hernia 2007; 2009  . Hypertension     History reviewed. No pertinent family history. Past Surgical History:  Procedure Laterality Date  . CATARACT EXTRACTION W/ INTRAOCULAR LENS   IMPLANT, BILATERAL Bilateral   . HERNIA REPAIR    . HIATAL HERNIA REPAIR  2007; 2009  . JOINT REPLACEMENT    . TOTAL KNEE ARTHROPLASTY Left 11/03/2015  . TOTAL KNEE ARTHROPLASTY Left 11/03/2015   Procedure: Left Total Knee Arthroplasty;  Surgeon: Alan Batman, MD;  [facility];  Service: Orthopedics;  Laterality: Left;  Marland Kitchen VIDEO BRONCHOSCOPY Bilateral 01/31/2013   Procedure: VIDEO BRONCHOSCOPY WITHOUT FLUORO;  Surgeon: Alan Katos, MD;  Location: [facility] ENDOSCOPY;  Service: Cardiopulmonary;  Laterality: Bilateral;   Social History   Social History Narrative  . Not on file     Objective: Vital Signs: BP 112/69 (BP Location: Left Arm, Patient Position: Sitting, Cuff Size: Normal)   Pulse 72   Ht 5\' 7"  (1.702 m)   Wt 156 lb (70.8 kg)   BMI 24.43 kg/m    Physical Exam  Constitutional: He is oriented to person, place, and time. He appears well-developed and well-nourished.  HENT:  Head: Normocephalic and atraumatic.  Eyes: Conjunctivae and EOM are normal. Pupils are equal, round, and reactive to light.  Neck: Normal range of motion. Neck supple.  Cardiovascular: Normal rate, regular rhythm and normal heart sounds.  Pulmonary/Chest: Effort normal and breath sounds normal.  Abdominal: Soft. Bowel sounds are normal.  Neurological: He is alert and oriented to person, place, and time.  Skin: Skin is warm and dry. Capillary refill takes less than 2 seconds.  Psoriasis patches on lower legs No nail pitting  Psychiatric: He has a normal mood and affect. His behavior is normal.  Nursing note and vitals reviewed.    Musculoskeletal Exam: C-spine, thoracic, and lumbar spine good ROM.  No midline spinal tenderness.  No SI joint tenderness.  Shoulder joints, elbow joints, wrist joints good ROM with no synovitis.  MCPs, PIPs, and DIPs good ROM with no synovitis.  He has PIP and DIP synovial thickening.  He has bilateral CMC synovial thickening.  Hip joints, knee joints, ankle joints, MTPs,  PIPs, and DIPs good ROM with no synovitis.  No trochanteric bursitis.  No achilles tendonitis or plantar fasciitis.    CDAI Exam: CDAI Homunculus Exam:   Joint Counts:  CDAI Tender Joint count: 0 CDAI Swollen Joint count: 0  Global Assessments:  Patient Global Assessment: 3 Provider Global Assessment: 3  CDAI Calculated Score: 6    Investigation: No additional findings.  CXR on 08/29/17 normal  CBC Latest Ref Rng & Units 08/24/2017 03/17/2017 01/16/2017  WBC 3.8 - 10.8 Thousand/uL 5.4 5.6 5.8  Hemoglobin 13.2 - 17.1 g/dL 78.2 95.6 12.5(L)  Hematocrit 38.5 - 50.0 % 40.3 39.7 37.4(L)  Platelets 140 - 400 Thousand/uL 177 179 219   CMP Latest Ref Rng & Units 08/24/2017 03/17/2017 01/16/2017  Glucose 65 - 99 mg/dL 96 97 213(Y)  BUN 7 - 25 mg/dL 17 19 20   Creatinine 0.70 - 1.18 mg/dL 8.65 7.84 6.96  Sodium 135 - 146 mmol/L 138 137 137  Potassium 3.5 - 5.3 mmol/L 4.8 4.3 4.6  Chloride 98 - 110 mmol/L 104 104 104  CO2 20 - 32 mmol/L 28 20 26   Calcium 8.6 - 10.3 mg/dL 8.8 9.0 8.8  Total Protein 6.1 - 8.1 g/dL 7.0 7.2 6.9  Total Bilirubin 0.2 - 1.2 mg/dL 0.6 0.7 0.6  Alkaline Phos 40 - 115 U/L - 83 87  AST 10 - 35 U/L 24 24 23   ALT 9 - 46 U/L 17 15 16     Imaging: Dg Chest 2 View  Result Date: 08/29/2017 CLINICAL DATA:  Changes in medications related to immunosuppressive therapy. History of hypertension. EXAM: CHEST  2 VIEW COMPARISON:  Radiographs 11/04/2015 and 10/28/2015. FINDINGS: The heart size and mediastinal contours are stable. There is chronic anterior eventration of the right hemidiaphragm. Mild chronic atelectasis or scarring at both lung bases is stable. There is no edema, confluent airspace opacity, pleural effusion or pneumothorax. No acute osseous findings. Postsurgical changes in the anterior abdominal wall. IMPRESSION: Stable chest.  No acute cardiopulmonary process. Electronically Signed   By: Carey BullocksWilliam  Veazey M.D.   On: 08/29/2017 17:20    Speciality Comments: No  specialty comments available.    Procedures:  No procedures performed Allergies: Patient has no known allergies.       Assessment / Plan:     Visit Diagnoses: Psoriatic arthritis [facility](HCC): He has no synovitis or dactylitis on exam.  He is clinically doing well on Otezla, MTX 0.6 ml weekly, and folic acid 2 mg daily.  He would like to discontinue [place] after he runs out at the end of February due to cost.  Cosentyx has been approved.  He would like to start Cosentyx in March 2019.   He had a CXR performed on 08/29/17 which was stable.  CBC and CMP were WNL on 08/24/17.  He has no history of IBD.  He had colonoscopy performed by Dr. Loreta AveMann. He will be scheduled for a nurse visit with Sue LushAndrea.  The patient was counseled on the indications, contraindications, and side effects of Cosentyx.  All questions were addressed.  He was consented today 09/04/17.    Medication counseling: TB Gold: CXR performed on 08/29/17 stable and no active cardiopulmonary disease noted. Hepatitis panel: 12/27/16 negative  HIV: 01/29/13 negative  SPEP: 12/27/16 n Immunoglobulin: 12/27/16 normal   Does patient have a history of inflammatory bowel disease? No  Counseled patient that Cosentyx is a IL-17 inhibitor that works to reduce pain and inflammation associated with arthritis.  Counseled patient on purpose, proper use, and adverse effects of Cosentyx. Reviewed the most common adverse effects of infection, inflammatory bowel disease, and allergic reaction.  Reviewed the importance of regular labs while on Cosentyx.  Counseled patient that Cosentyx should be held prior to scheduled surgery.  Counseled patient to avoid live vaccines while on Cosentyx.  Advised patient to get annual influenza vaccine and the pneumococcal vaccine as indicated.  Provided patient with medication education material and answered all questions.  Patient consented to Cosentyx.  Will upload consent into patient's chart.  Will apply for Cosentyx through patient's  insurance.  Reviewed storage information for Cosentyx.  Advised initial injection must be administered in office.  Patient voiced understanding.  Psoriasis: He has a few scattered patches of psoriasis on his lower legs.  He uses Clobetasol cream PRN.  He reports his psoriasis is 95% cleared while being on [place].   High risk medication use - Otezla until end of Feb., MTX 0.6 ml weekly, folic acid 2 mg, CXR normal 08/29/17 and CBC/CMP WNL on 08/24/17  Status post total knee replacement, left: Doing well.  No warmth or effusion.  Good ROM on exam.   Essential hypertension: well controlled in the office today.   Extrapulmonary TB (tuberculosis): CXR  performed on 08/29/17 was stable and did not reveal active cardiopulmonary disease.   Thrombocytopenia (HCC): Plt WNL on 08/24/17.   Normocytic anemia: No evidence of anemia on CBC on 08/24/17.    Orders: No orders of the defined types were placed in this encounter.  No orders of the defined types were placed in this encounter.    Follow-Up Instructions: Return in about 3 months (around 12/02/2017) for Psoriatic arthritis.   Gearldine Bienenstock, PA-C  Note - This record has been created using Dragon software.  Chart creation errors have been sought, but may not always  have been located. Such creation errors do not reflect on  the standard of medical care.

## 2017-09-04 ENCOUNTER — Ambulatory Visit (INDEPENDENT_AMBULATORY_CARE_PROVIDER_SITE_OTHER): Payer: Medicare Other | Admitting: Physician Assistant

## 2017-09-04 ENCOUNTER — Encounter: Payer: Self-pay | Admitting: Physician Assistant

## 2017-09-04 VITALS — BP 112/69 | HR 72 | Ht 67.0 in | Wt 156.0 lb

## 2017-09-04 DIAGNOSIS — I10 Essential (primary) hypertension: Secondary | ICD-10-CM

## 2017-09-04 DIAGNOSIS — L409 Psoriasis, unspecified: Secondary | ICD-10-CM | POA: Diagnosis not present

## 2017-09-04 DIAGNOSIS — D696 Thrombocytopenia, unspecified: Secondary | ICD-10-CM

## 2017-09-04 DIAGNOSIS — A1889 Tuberculosis of other sites: Secondary | ICD-10-CM

## 2017-09-04 DIAGNOSIS — D649 Anemia, unspecified: Secondary | ICD-10-CM

## 2017-09-04 DIAGNOSIS — Z96652 Presence of left artificial knee joint: Secondary | ICD-10-CM | POA: Diagnosis not present

## 2017-09-04 DIAGNOSIS — L4050 Arthropathic psoriasis, unspecified: Secondary | ICD-10-CM | POA: Diagnosis not present

## 2017-09-04 DIAGNOSIS — Z79899 Other long term (current) drug therapy: Secondary | ICD-10-CM | POA: Diagnosis not present

## 2017-09-04 NOTE — Patient Instructions (Signed)
Secukinumab injection What is this medicine? SECUKINUMAB (sek ue KIN ue mab) is used to treat psoriasis. It is also used to treat psoriatic arthritis and ankylosing spondylitis. This medicine may be used for other purposes; ask your health care provider or pharmacist if you have questions. COMMON BRAND NAME(S): Cosentyx What should I tell my health care provider before I take this medicine? They need to know if you have any of these conditions: -Crohn's disease, ulcerative colitis, or other inflammatory bowel disease -infection or history of infection -other conditions affecting the immune system -recently received or are scheduled to receive a vaccine -tuberculosis, a positive skin test for tuberculosis, or have recently been in close contact with someone who has tuberculosis -an unusual or allergic reaction to secukinumab, other medicines, latex, rubber, foods, dyes, or preservatives -pregnant or trying to get pregnant -breast-feeding How should I use this medicine? This medicine is for injection under the skin. It may be administered by a healthcare professional in a hospital or clinic setting or at home. If you get this medicine at home, you will be taught how to prepare and give this medicine. Use exactly as directed. Take your medicine at regular intervals. Do not take your medicine more often than directed. It is important that you put your used needles and syringes in a special sharps container. Do not put them in a trash can. If you do not have a sharps container, call your pharmacist or healthcare provider to get one. A special MedGuide will be given to you by the pharmacist with each prescription and refill. Be sure to read this information carefully each time. Talk to your pediatrician regarding the use of this medicine in children. Special care may be needed. Overdosage: If you think you have taken too much of this medicine contact a poison control center or emergency room at  once. NOTE: This medicine is only for you. Do not share this medicine with others. What if I miss a dose? It is important not to miss your dose. Call your doctor of health care professional if you are unable to keep an appointment. If you give yourself the medicine and you miss a dose, take it as soon as you can. If it is almost time for your next dose, take only that dose. Do not take double or extra doses. What may interact with this medicine? Do not take this medicine with any of the following medications: -live virus vaccines This medicine may also interact with the following medications: -cyclosporine -inactivated vaccines -warfarin This list may not describe all possible interactions. Give your health care provider a list of all the medicines, herbs, non-prescription drugs, or dietary supplements you use. Also tell them if you smoke, drink alcohol, or use illegal drugs. Some items may interact with your medicine. What should I watch for while using this medicine? Tell your doctor or healthcare professional if your symptoms do not start to get better or if they get worse. You will be tested for tuberculosis (TB) before you start this medicine. If your doctor prescribes any medicine for TB, you should start taking the TB medicine before starting this medicine. Make sure to finish the full course of TB medicine. Call your doctor or healthcare professional for advice if you get a fever, chills or sore throat, or other symptoms of a cold or flu. Do not treat yourself. This drug decreases your body's ability to fight infections. Try to avoid being around people who are sick. This medicine can decrease   the response to a vaccine. If you need to get vaccinated, tell your healthcare professional if you have received this medicine within the last 6 months. Extra booster doses may be needed. Talk to your doctor to see if a different vaccination schedule is needed. What side effects may I notice from  receiving this medicine? Side effects that you should report to your doctor or health care professional as soon as possible: -allergic reactions like skin rash, itching or hives, swelling of the face, lips, or tongue -signs and symptoms of infection like fever or chills; cough; sore throat; pain or trouble passing urine Side effects that usually do not require medical attention (report to your doctor or health care professional if they continue or are bothersome): -diarrhea This list may not describe all possible side effects. Call your doctor for medical advice about side effects. You may report side effects to FDA at 1-800-FDA-1088. Where should I keep my medicine? Keep out of the reach of children. Store the prefilled syringe or injection pen in a refrigerator between 2 to 8 degrees C (36 to 46 degrees F). Keep the syringe or the pen in the original carton until ready for use. Protect from light. Do not freeze. Do not shake. Prior to use, remove the syringe or pen from the refrigerator and use within 1 hour. Throw away any unused medicine after the expiration date on the label. NOTE: This sheet is a summary. It may not cover all possible information. If you have questions about this medicine, talk to your doctor, pharmacist, or health care provider.  2018 Elsevier/Gold Standard (2015-08-20 11:48:31)  

## 2017-10-03 ENCOUNTER — Telehealth: Payer: Self-pay | Admitting: *Deleted

## 2017-10-03 DIAGNOSIS — M159 Polyosteoarthritis, unspecified: Secondary | ICD-10-CM | POA: Diagnosis not present

## 2017-10-03 DIAGNOSIS — I10 Essential (primary) hypertension: Secondary | ICD-10-CM | POA: Diagnosis not present

## 2017-10-03 NOTE — Telephone Encounter (Signed)
Patient is currently out of town and not able to start medication yet. He will call when he comes back to town.

## 2017-10-03 NOTE — Telephone Encounter (Signed)
-----   Message from [facility], CPhT sent at 10/02/2017  4:39 PM EST ----- Regarding: FW: Send Cosentyx Maintenance Dose to [facility]   ----- Message ----- From: Loyce DysHopkins, Chasta M, CPhT Sent: 08/30/2017   9:09 AM To: Joycelyn Ruahasta M Hopkins, CPhT Subject: Send Cosentyx Maintenance Dose to [facility]  Alan Roman is starting Cosnetyx. We are giving 4 boxes from samples (4 doses) we will send an RX for 300mg  (1 box) for his last loading dose and then he will continue with 1 box/month maintenance dose. He is suppose to call us when he is down to one week.

## 2017-10-10 ENCOUNTER — Telehealth: Payer: Self-pay | Admitting: Rheumatology

## 2017-10-10 NOTE — Telephone Encounter (Signed)
Patient states he still has a whole month supply of Otzela. Patient states he will call when is down 1 week and will call to advise us so we can set up his appointment to start on Cosentyx.

## 2017-10-10 NOTE — Telephone Encounter (Signed)
Patient left a message 10/09/2017 at 4:23pm, returning your call.

## 2017-10-19 DIAGNOSIS — Z6823 Body mass index (BMI) 23.0-23.9, adult: Secondary | ICD-10-CM | POA: Diagnosis not present

## 2017-10-19 DIAGNOSIS — Z789 Other specified health status: Secondary | ICD-10-CM | POA: Diagnosis not present

## 2017-10-19 DIAGNOSIS — I10 Essential (primary) hypertension: Secondary | ICD-10-CM | POA: Diagnosis not present

## 2017-10-19 DIAGNOSIS — Z713 Dietary counseling and surveillance: Secondary | ICD-10-CM | POA: Diagnosis not present

## 2017-10-19 DIAGNOSIS — M1990 Unspecified osteoarthritis, unspecified site: Secondary | ICD-10-CM | POA: Diagnosis not present

## 2017-10-19 DIAGNOSIS — Z299 Encounter for prophylactic measures, unspecified: Secondary | ICD-10-CM | POA: Diagnosis not present

## 2017-10-30 DIAGNOSIS — M159 Polyosteoarthritis, unspecified: Secondary | ICD-10-CM | POA: Diagnosis not present

## 2017-10-30 DIAGNOSIS — I10 Essential (primary) hypertension: Secondary | ICD-10-CM | POA: Diagnosis not present

## 2017-11-13 ENCOUNTER — Telehealth: Payer: Self-pay | Admitting: Rheumatology

## 2017-11-13 NOTE — Telephone Encounter (Signed)
Patient called stating that she was returning your call. °

## 2017-11-13 NOTE — Telephone Encounter (Signed)
Patient states he finished his Mauritaniatezla. Patient states he had his last dose on 11/12/17. Patient is ready to start Cosentyx.

## 2017-11-14 NOTE — Telephone Encounter (Signed)
Dr. Corliss Skainseveshwar patient needs to wait 1 week between stopping Otezla and starting Cosentyx. Attempted to contact the patient and left message for patient to call the office.

## 2017-11-16 DIAGNOSIS — H903 Sensorineural hearing loss, bilateral: Secondary | ICD-10-CM | POA: Diagnosis not present

## 2017-11-16 DIAGNOSIS — H6123 Impacted cerumen, bilateral: Secondary | ICD-10-CM | POA: Diagnosis not present

## 2017-11-16 DIAGNOSIS — H9313 Tinnitus, bilateral: Secondary | ICD-10-CM | POA: Diagnosis not present

## 2017-11-21 ENCOUNTER — Telehealth: Payer: Self-pay | Admitting: *Deleted

## 2017-11-21 MED ORDER — SECUKINUMAB 150 MG/ML ~~LOC~~ SOAJ: 300.0000 mg | SUBCUTANEOUS | 0 refills | Status: DC

## 2017-11-21 NOTE — Telephone Encounter (Signed)
[facility] Medication has been placed in refrigerator.  Scottie Metayer, [place], CPhT 4:07 PM

## 2017-11-21 NOTE — Telephone Encounter (Signed)
Prescription sent to the pharmacy for patient.

## 2017-11-23 ENCOUNTER — Ambulatory Visit (INDEPENDENT_AMBULATORY_CARE_PROVIDER_SITE_OTHER): Payer: Medicare Other | Admitting: *Deleted

## 2017-11-23 VITALS — BP 112/69 | HR 61

## 2017-11-23 DIAGNOSIS — L4050 Arthropathic psoriasis, unspecified: Secondary | ICD-10-CM

## 2017-11-23 DIAGNOSIS — L409 Psoriasis, unspecified: Secondary | ICD-10-CM

## 2017-11-23 MED ORDER — SECUKINUMAB 150 MG/ML ~~LOC~~ SOAJ
300.0000 mg | Freq: Once | SUBCUTANEOUS | Status: AC
  Administered 2017-11-23: 300 mg via SUBCUTANEOUS

## 2017-11-23 NOTE — Patient Instructions (Signed)
Standing Labs We placed an order today for your standing lab work.    Please come back and get your standing labs in 1 month and every 3 months  We have open lab Monday through Friday from 8:30-11:30 AM and 1:30-4:00 PM  at the office of Dr. Shaili Deveshwar.   You may experience shorter wait times on Monday and Friday afternoons. The office is located at [address], [address], [place], [place] [address] No appointment is necessary.   Labs are drawn by [facility].  You may receive a bill from [facility] for your lab work. If you have any questions regarding directions or hours of operation,  please call [phone].     

## 2017-11-23 NOTE — Progress Notes (Signed)
Patient in office for a new start to Cosentyx. Patient is switching from Mauritaniatezla to Cosentyx. Patient's last dose of Otezla was 11/12/17. Patient was given a demonstration on proper technique to self administer medication. Patient was able to show proper technique. Patient was given injections in right and left lower abdomen. Patient tolerated injections well. He was monitored in office for 30 minutes after administration for adverse reactions. No adverse reactions noted.   Administrations This Visit    Secukinumab SOAJ 300 mg    Admin Date 11/23/2017 Action Given Dose 300 mg Route Subcutaneous Administered By Henriette CombsHatton, Andrea L, LPN

## 2017-12-01 DIAGNOSIS — M159 Polyosteoarthritis, unspecified: Secondary | ICD-10-CM | POA: Diagnosis not present

## 2017-12-01 DIAGNOSIS — I10 Essential (primary) hypertension: Secondary | ICD-10-CM | POA: Diagnosis not present

## 2017-12-22 ENCOUNTER — Other Ambulatory Visit: Payer: Self-pay

## 2017-12-22 ENCOUNTER — Other Ambulatory Visit: Payer: Self-pay | Admitting: *Deleted

## 2017-12-22 DIAGNOSIS — Z79899 Other long term (current) drug therapy: Secondary | ICD-10-CM

## 2017-12-22 LAB — COMPLETE METABOLIC PANEL WITH GFR
AG Ratio: 1.4 (calc) (ref 1.0–2.5)
ALT: 15 U/L (ref 9–46)
AST: 26 U/L (ref 10–35)
Albumin: 4 g/dL (ref 3.6–5.1)
Alkaline phosphatase (APISO): 83 U/L (ref 40–115)
BUN: 19 mg/dL (ref 7–25)
CO2: 26 mmol/L (ref 20–32)
Calcium: 8.5 mg/dL — ABNORMAL LOW (ref 8.6–10.3)
Chloride: 105 mmol/L (ref 98–110)
Creat: 1 mg/dL (ref 0.70–1.18)
GFR, Est African American: 86 mL/min/{1.73_m2} (ref >=60)
GFR, Est Non African American: 74 mL/min/{1.73_m2} (ref >=60)
Globulin: 2.8 g/dL (calc) (ref 1.9–3.7)
Glucose, Bld: 120 mg/dL — ABNORMAL HIGH (ref 65–99)
Potassium: 4.3 mmol/L (ref 3.5–5.3)
Sodium: 138 mmol/L (ref 135–146)
Total Bilirubin: 0.6 mg/dL (ref 0.2–1.2)
Total Protein: 6.8 g/dL (ref 6.1–8.1)

## 2017-12-22 LAB — CBC WITH DIFFERENTIAL/PLATELET
Basophils Absolute: 30 cells/uL (ref 0–200)
Basophils Relative: 0.6 %
Eosinophils Absolute: 270 cells/uL (ref 15–500)
Eosinophils Relative: 5.4 %
HCT: 37.9 % — ABNORMAL LOW (ref 38.5–50.0)
Hemoglobin: 13.5 g/dL (ref 13.2–17.1)
Lymphs Abs: 1225 cells/uL (ref 850–3900)
MCH: 32.2 pg (ref 27.0–33.0)
MCHC: 35.6 g/dL (ref 32.0–36.0)
MCV: 90.5 fL (ref 80.0–100.0)
MPV: 10.9 fL (ref 7.5–12.5)
Monocytes Relative: 11.9 %
Neutro Abs: 2880 cells/uL (ref 1500–7800)
Neutrophils Relative %: 57.6 %
Platelets: 165 10*3/uL (ref 140–400)
RBC: 4.19 10*6/uL — ABNORMAL LOW (ref 4.20–5.80)
RDW: 12.3 % (ref 11.0–15.0)
Total Lymphocyte: 24.5 %
WBC mixed population: 595 cells/uL (ref 200–950)
WBC: 5 10*3/uL (ref 3.8–10.8)

## 2017-12-26 ENCOUNTER — Telehealth: Payer: Self-pay

## 2017-12-26 MED ORDER — SECUKINUMAB 150 MG/ML ~~LOC~~ SOAJ: 300.0000 mg | SUBCUTANEOUS | 0 refills | Status: DC

## 2017-12-26 NOTE — Telephone Encounter (Signed)
Patient called to request refill of cosentyx.   Last visit: 09/04/2017 Next visit: 01/17/2018 Labs: 12/22/2017   Is this patient on  every 28 days?

## 2017-12-26 NOTE — Telephone Encounter (Signed)
Yes, thank you for clarifying. He is on Cosentyx 300 mg subcutaneous injections every 28 days

## 2018-01-03 NOTE — Progress Notes (Deleted)
Office Visit Note  Patient: Alan Roman             Date of Birth: 10-27-1945           MRN: [id number]             PCP: Ignatius SpeckingVyas, Dhruv B, MD Referring: Ignatius SpeckingVyas, Dhruv B, MD Visit Date: 01/17/2018 Occupation: @GUAROCC @    Subjective:  No chief complaint on file.   History of Present Illness: Alan Roman is a [age] y.o. male ***   Activities of Daily Living:  Patient reports morning stiffness for *** {minute/hour:19697}.   Patient {ACTIONS;DENIES/REPORTS:21021675::"Denies"} nocturnal pain.  Difficulty dressing/grooming: {ACTIONS;DENIES/REPORTS:21021675::"Denies"} Difficulty climbing stairs: {ACTIONS;DENIES/REPORTS:21021675::"Denies"} Difficulty getting out of chair: {ACTIONS;DENIES/REPORTS:21021675::"Denies"} Difficulty using hands for taps, buttons, cutlery, and/or writing: {ACTIONS;DENIES/REPORTS:21021675::"Denies"}   No Rheumatology ROS completed.   PMFS History:  Patient Active Problem List   Diagnosis Date Noted  . High risk medication use 12/27/2016  . History of tuberculosis 11/05/2015  . Orthostatic hypotension 11/05/2015  . Constipation 11/05/2015  . Thrombocytopenia (HCC) 11/05/2015  . Normocytic anemia 11/05/2015  . Psoriatic arthritis (HCC) 11/03/2015  . Essential hypertension 11/03/2015  . Osteoarthritis of left knee 11/03/2015  . Status post total knee replacement, left 11/03/2015  . Transaminitis 02/02/2013  . Extrapulmonary TB (tuberculosis) 02/02/2013  . Pulmonary infiltrate 01/29/2013  . Pleural effusion 01/26/2013  . Acute bronchiolitis due to other infectious organisms 01/26/2013  . Hyponatremia 01/24/2013  . Psoriasis 01/23/2013  . Fever, unknown origin 01/23/2013  . Immunocompromised state (HCC) 01/23/2013    Past Medical History:  Diagnosis Date  . Arthritis    "knees" (11/03/2015)  . History of hiatal hernia 2007; 2009  . Hypertension     No family history on file. Past Surgical History:  Procedure Laterality Date  . CATARACT  EXTRACTION W/ INTRAOCULAR LENS  IMPLANT, BILATERAL Bilateral   . HERNIA REPAIR    . HIATAL HERNIA REPAIR  2007; 2009  . JOINT REPLACEMENT    . TOTAL KNEE ARTHROPLASTY Left 11/03/2015  . TOTAL KNEE ARTHROPLASTY Left 11/03/2015   Procedure: Left Total Knee Arthroplasty;  Surgeon: Valeria BatmanPeter W Whitfield, MD;  [facility];  Service: Orthopedics;  Laterality: Left;  Marland Kitchen. VIDEO BRONCHOSCOPY Bilateral 01/31/2013   Procedure: VIDEO BRONCHOSCOPY WITHOUT FLUORO;  Surgeon: Merwyn Katosavid B Simonds, MD;  [facility];  Service: Cardiopulmonary;  Laterality: Bilateral;   Social History   Social History Narrative  . Not on file     Objective: Vital Signs: There were no vitals taken for this visit.   Physical Exam   Musculoskeletal Exam: ***  CDAI Exam: No CDAI exam completed.    Investigation: No additional findings. CBC Latest Ref Rng & Units 12/22/2017 08/24/2017 03/17/2017  WBC 3.8 - 10.8 Thousand/uL 5.0 5.4 5.6  Hemoglobin 13.2 - 17.1 g/dL 40.913.5 81.113.8 91.413.6  Hematocrit 38.5 - 50.0 % 37.9(L) 40.3 39.7  Platelets 140 - 400 Thousand/uL 165 177 179   CMP Latest Ref Rng & Units 12/22/2017 08/24/2017 03/17/2017  Glucose 65 - 99 mg/dL 782(N120(H) 96 97  BUN 7 - 25 mg/dL 19 17 19   Creatinine 0.70 - 1.18 mg/dL 5.621.00 1.301.07 8.651.03  Sodium 135 - 146 mmol/L 138 138 137  Potassium 3.5 - 5.3 mmol/L 4.3 4.8 4.3  Chloride 98 - 110 mmol/L 105 104 104  CO2 20 - 32 mmol/L 26 28 20   Calcium 8.6 - 10.3 mg/dL 7.8(I8.5(L) 8.8 9.0  Total Protein 6.1 - 8.1 g/dL 6.8 7.0 7.2  Total Bilirubin 0.2 - 1.2 mg/dL  0.6 0.6 0.7  Alkaline Phos 40 - 115 U/L - - 83  AST 10 - 35 U/L 26 24 24   ALT 9 - 46 U/L 15 17 15     Imaging: No results found.  Speciality Comments: No specialty comments available.    Procedures:  No procedures performed Allergies: Patient has no known allergies.   Assessment / Plan:     Visit Diagnoses: No diagnosis found.    Orders: No orders of the defined types were placed in this encounter.  No orders of  the defined types were placed in this encounter.   Face-to-face time spent with patient was *** minutes. 50% of time was spent in counseling and coordination of care.  Follow-Up Instructions: No follow-ups on file.   Ellen Henri, CMA  Note - This record has been created using Animal nutritionist.  Chart creation errors have been sought, but may not always  have been located. Such creation errors do not reflect on  the standard of medical care.

## 2018-01-15 NOTE — Progress Notes (Deleted)
Office Visit Note  Patient: Alan Roman             Date of Birth: 05/08/1946           MRN: [id number]             PCP: Ignatius SpeckingVyas, Dhruv B, MD Referring: Ignatius SpeckingVyas, Dhruv B, MD Visit Date: 01/19/2018 Occupation: @GUAROCC @    Subjective:  No chief complaint on file.   History of Present Illness: Alan Roman is a 72 y.o. male ***   Activities of Daily Living:  Patient reports morning stiffness for *** {minute/hour:19697}.   Patient {ACTIONS;DENIES/REPORTS:21021675::"Denies"} nocturnal pain.  Difficulty dressing/grooming: {ACTIONS;DENIES/REPORTS:21021675::"Denies"} Difficulty climbing stairs: {ACTIONS;DENIES/REPORTS:21021675::"Denies"} Difficulty getting out of chair: {ACTIONS;DENIES/REPORTS:21021675::"Denies"} Difficulty using hands for taps, buttons, cutlery, and/or writing: {ACTIONS;DENIES/REPORTS:21021675::"Denies"}   No Rheumatology ROS completed.   PMFS History:  Patient Active Problem List   Diagnosis Date Noted  . High risk medication use 12/27/2016  . History of tuberculosis 11/05/2015  . Orthostatic hypotension 11/05/2015  . Constipation 11/05/2015  . Thrombocytopenia (HCC) 11/05/2015  . Normocytic anemia 11/05/2015  . Psoriatic arthritis (HCC) 11/03/2015  . Essential hypertension 11/03/2015  . Osteoarthritis of left knee 11/03/2015  . Status post total knee replacement, left 11/03/2015  . Transaminitis 02/02/2013  . Extrapulmonary TB (tuberculosis) 02/02/2013  . Pulmonary infiltrate 01/29/2013  . Pleural effusion 01/26/2013  . Acute bronchiolitis due to other infectious organisms 01/26/2013  . Hyponatremia 01/24/2013  . Psoriasis 01/23/2013  . Fever, unknown origin 01/23/2013  . Immunocompromised state (HCC) 01/23/2013    Past Medical History:  Diagnosis Date  . Arthritis    "knees" (11/03/2015)  . History of hiatal hernia 2007; 2009  . Hypertension     No family history on file. Past Surgical History:  Procedure Laterality Date  . CATARACT  EXTRACTION W/ INTRAOCULAR LENS  IMPLANT, BILATERAL Bilateral   . HERNIA REPAIR    . HIATAL HERNIA REPAIR  2007; 2009  . JOINT REPLACEMENT    . TOTAL KNEE ARTHROPLASTY Left 11/03/2015  . TOTAL KNEE ARTHROPLASTY Left 11/03/2015   Procedure: Left Total Knee Arthroplasty;  Surgeon: Valeria BatmanPeter W Whitfield, MD;  [facility];  Service: Orthopedics;  Laterality: Left;  Marland Kitchen. VIDEO BRONCHOSCOPY Bilateral 01/31/2013   Procedure: VIDEO BRONCHOSCOPY WITHOUT FLUORO;  Surgeon: Merwyn Katosavid B Simonds, MD;  [facility];  Service: Cardiopulmonary;  Laterality: Bilateral;   Social History   Social History Narrative  . Not on file     Objective: Vital Signs: There were no vitals taken for this visit.   Physical Exam   Musculoskeletal Exam: ***  CDAI Exam: No CDAI exam completed.    Investigation: No additional findings. CBC Latest Ref Rng & Units 12/22/2017 08/24/2017 03/17/2017  WBC 3.8 - 10.8 Thousand/uL 5.0 5.4 5.6  Hemoglobin 13.2 - 17.1 g/dL 95.613.5 21.313.8 08.613.6  Hematocrit 38.5 - 50.0 % 37.9(L) 40.3 39.7  Platelets 140 - 400 Thousand/uL 165 177 179   CMP Latest Ref Rng & Units 12/22/2017 08/24/2017 03/17/2017  Glucose 65 - 99 mg/dL 578(I120(H) 96 97  BUN 7 - 25 mg/dL 19 17 19   Creatinine 0.70 - 1.18 mg/dL 6.961.00 2.951.07 2.841.03  Sodium 135 - 146 mmol/L 138 138 137  Potassium 3.5 - 5.3 mmol/L 4.3 4.8 4.3  Chloride 98 - 110 mmol/L 105 104 104  CO2 20 - 32 mmol/L 26 28 20   Calcium 8.6 - 10.3 mg/dL 1.3(K8.5(L) 8.8 9.0  Total Protein 6.1 - 8.1 g/dL 6.8 7.0 7.2  Total Bilirubin 0.2 - 1.2 mg/dL  0.6 0.6 0.7  Alkaline Phos 40 - 115 U/L - - 83  AST 10 - 35 U/L 26 24 24   ALT 9 - 46 U/L 15 17 15      Imaging: No results found.  Speciality Comments: No specialty comments available.    Procedures:  No procedures performed Allergies: Patient has no known allergies.   Assessment / Plan:     Visit Diagnoses: Psoriatic arthritis (HCC)  Psoriasis  High risk medication use - Cosentyx, MTX 0.6 ml, folic acid 2 mg daily    Primary osteoarthritis of right knee  Status post total knee replacement, left  Essential hypertension  Extrapulmonary TB (tuberculosis) - CXR performed on 08/29/17 stable and no active cardiopulmonary disease noted.  Thrombocytopenia (HCC)  Normocytic anemia    Orders: No orders of the defined types were placed in this encounter.  No orders of the defined types were placed in this encounter.   Face-to-face time spent with patient was *** minutes. 50% of time was spent in counseling and coordination of care.  Follow-Up Instructions: No follow-ups on file.   Gearldine Bienenstock, PA-C  Note - This record has been created using Dragon software.  Chart creation errors have been sought, but may not always  have been located. Such creation errors do not reflect on  the standard of medical care.

## 2018-01-16 NOTE — Progress Notes (Signed)
Office Visit Note  Patient: Alan Roman             Date of Birth: [birth year]           MRN: [id number]             PCP: Ignatius Specking, MD Referring: Ignatius Specking, MD Visit Date: 01/22/2018 Occupation: @GUAROCC @    Subjective:  Right knee pain   History of Present Illness: Alan Roman is a 72 y.o. male with history of psoriatic arthritis and osteoarthritis.  Patient was started on Cosentyx subcutaneous injections on 11/23/2017.  He discontinued methotrexate 0.6 mL once weekly and Otezla at the same time.  He has not noticed any difference since discontinuing methotrexate.  He feels as though Cosentyx has been helping resolve his psoriasis.  He denies any plantar fasciitis or Achilles tendinitis.  He denies any SI joint pain.  He denies any pain in his hands or feet at this time.  He has been having increased pain in his right knee for the past 1 month.  He states that he has noticed some swelling in difficulty with range of motion.  He denies any mechanical symptoms.  He does feel as though the joint stiffness has been worsening in his right knee.  He states that he saw Dr. Cleophas Dunker in the past who did his left total knee replacement.  He reports his left knee is doing well with no swelling or pain at this time.  He denies any other joint pain or joint swelling at this time.   Activities of Daily Living Patient reports morning stiffness for 0 minutes.   Patient Denies nocturnal pain.  Difficulty dressing/grooming: Denies Difficulty climbing stairs: Reports Difficulty getting out of chair: Reports Difficulty using hands for taps, buttons, cutlery, and/or writing: Denies   Review of Systems  Constitutional: Negative for fatigue and night sweats.  HENT: Negative for mouth sores, trouble swallowing, trouble swallowing, mouth dryness and nose dryness.   Eyes: Positive for dryness. Negative for redness and visual disturbance.  Respiratory: Negative for cough, hemoptysis,  shortness of breath and difficulty breathing.   Cardiovascular: Negative for chest pain, palpitations, hypertension, irregular heartbeat and swelling in legs/feet.  Gastrointestinal: Negative for blood in stool, constipation and diarrhea.  Endocrine: Negative for increased urination.  Genitourinary: Negative for painful urination.  Musculoskeletal: Positive for arthralgias, joint pain and joint swelling. Negative for myalgias, muscle weakness, morning stiffness, muscle tenderness and myalgias.  Skin: Positive for rash. Negative for color change, hair loss, nodules/bumps, skin tightness, ulcers and sensitivity to sunlight.  Allergic/Immunologic: Negative for susceptible to infections.  Neurological: Negative for dizziness, fainting, memory loss, night sweats and weakness.  Hematological: Negative for swollen glands.  Psychiatric/Behavioral: Negative for depressed mood and sleep disturbance. The patient is not nervous/anxious.     PMFS History:  Patient Active Problem List   Diagnosis Date Noted  . High risk medication use 12/27/2016  . History of tuberculosis 11/05/2015  . Orthostatic hypotension 11/05/2015  . Constipation 11/05/2015  . Thrombocytopenia (HCC) 11/05/2015  . Normocytic anemia 11/05/2015  . Psoriatic arthritis (HCC) 11/03/2015  . Essential hypertension 11/03/2015  . Osteoarthritis of left knee 11/03/2015  . Status post total knee replacement, left 11/03/2015  . Transaminitis 02/02/2013  . Extrapulmonary TB (tuberculosis) 02/02/2013  . Pulmonary infiltrate 01/29/2013  . Pleural effusion 01/26/2013  . Acute bronchiolitis due to other infectious organisms 01/26/2013  . Hyponatremia 01/24/2013  . Psoriasis 01/23/2013  . Fever, unknown origin 01/23/2013  .  Immunocompromised state (HCC) 01/23/2013    Past Medical History:  Diagnosis Date  . Arthritis    "knees" (11/03/2015)  . History of hiatal hernia 2007; 2009  . Hypertension     History reviewed. No pertinent family  history. Past Surgical History:  Procedure Laterality Date  . CATARACT EXTRACTION W/ INTRAOCULAR LENS  IMPLANT, BILATERAL Bilateral   . HERNIA REPAIR    . HIATAL HERNIA REPAIR  2007; 2009  . JOINT REPLACEMENT    . TOTAL KNEE ARTHROPLASTY Left 11/03/2015  . TOTAL KNEE ARTHROPLASTY Left 11/03/2015   Procedure: Left Total Knee Arthroplasty;  Surgeon: Valeria Batman, MD;  Location: [facility] OR;  Service: Orthopedics;  Laterality: Left;  Marland Kitchen VIDEO BRONCHOSCOPY Bilateral 01/31/2013   Procedure: VIDEO BRONCHOSCOPY WITHOUT FLUORO;  Surgeon: Merwyn Katos, MD;  Location: [facility] ENDOSCOPY;  Service: Cardiopulmonary;  Laterality: Bilateral;   Social History   Social History Narrative  . Not on file     Objective: Vital Signs: BP 113/69 (BP Location: Left Arm, Patient Position: Sitting, Cuff Size: Small)   Pulse 77   Resp 12   Ht 5\' 7"  (1.702 m)   Wt 158 lb (71.7 kg)   BMI 24.75 kg/m    Physical Exam  Constitutional: He is oriented to person, place, and time. He appears well-developed and well-nourished.  HENT:  Head: Normocephalic and atraumatic.  Eyes: Pupils are equal, round, and reactive to light. Conjunctivae and EOM are normal.  Neck: Normal range of motion. Neck supple.  Cardiovascular: Normal rate, regular rhythm and normal heart sounds.  Pulmonary/Chest: Effort normal and breath sounds normal.  Abdominal: Soft. Bowel sounds are normal.  Lymphadenopathy:    He has no cervical adenopathy.  Neurological: He is alert and oriented to person, place, and time.  Skin: Skin is warm and dry. Capillary refill takes less than 2 seconds.  Psychiatric: He has a normal mood and affect. His behavior is normal.  Nursing note and vitals reviewed.    Musculoskeletal Exam: C-spine, thoracic spine, lumbar spine good range of motion.  No midline spinal tenderness.  No SI joint tenderness.  Shoulder joints, elbow joints, wrist joints, MCPs, PIPs, DIPs good range of motion with no synovitis.  He has no  tenderness PIP or DIP joints.  He has PIP and DIP synovial thickening.  Bilateral CMC joint synovial thickening but no tenderness is noted.  Hip joints, ankle joints good range of motion with no synovitis.  He has some warmth of the right knee and limited extension.  Right knee crepitus.  Left knee full range of motion with no discomfort.  No tenderness of Achilles tendon or plantar fascia bilaterally.   CDAI Exam: CDAI Homunculus Exam:   Joint Counts:  CDAI Tender Joint count: 0 CDAI Swollen Joint count: 0  Global Assessments:  Patient Global Assessment: 2 Provider Global Assessment: 2  CDAI Calculated Score: 4    Investigation: No additional findings. CBC Latest Ref Rng & Units 12/22/2017 08/24/2017 03/17/2017  WBC 3.8 - 10.8 Thousand/uL 5.0 5.4 5.6  Hemoglobin 13.2 - 17.1 g/dL 16.1 09.6 04.5  Hematocrit 38.5 - 50.0 % 37.9(L) 40.3 39.7  Platelets 140 - 400 Thousand/uL 165 177 179   CMP Latest Ref Rng & Units 12/22/2017 08/24/2017 03/17/2017  Glucose 65 - 99 mg/dL 409(W) 96 97  BUN 7 - 25 mg/dL 19 17 19   Creatinine 0.70 - 1.18 mg/dL 1.19 1.47 8.29  Sodium 135 - 146 mmol/L 138 138 137  Potassium 3.5 - 5.3 mmol/L  4.3 4.8 4.3  Chloride 98 - 110 mmol/L 105 104 104  CO2 20 - 32 mmol/L 26 28 20   Calcium 8.6 - 10.3 mg/dL 8.2(N) 8.8 9.0  Total Protein 6.1 - 8.1 g/dL 6.8 7.0 7.2  Total Bilirubin 0.2 - 1.2 mg/dL 0.6 0.6 0.7  Alkaline Phos 40 - 115 U/L - - 83  AST 10 - 35 U/L 26 24 24   ALT 9 - 46 U/L 15 17 15      Imaging: No results found.  Speciality Comments: No specialty comments available.    Procedures:  No procedures performed Allergies: Patient has no known allergies.   Assessment / Plan:     Visit Diagnoses: Psoriatic arthritis (HCC): He has no synovitis or dactylitis on exam.  He has synovial thickening of PIP and DIPs joints.  He has not had any recent flares of psoriatic arthritis.  He denies any pain in his hands or feet at this time.  No plantar fasciitis or  Achilles tendinitis.  No tenderness of SI joints.  He was started on Cosentyx subcutaneous injections on 11/23/2017.  Prior to starting on Cosentyx he discontinued Otezla and methotrexate.  He was unaware that he was supposed to stay on methotrexate.  Due to not having any increased joint pain or joint swelling he does not need to restart methotrexate at this time.  He will continue injecting Cosentyx 300 mg every 28 days.  Psoriasis: Cosentyx has been clearing his psoriasis.  He has clobetasol cream that he can use topically if his psoriasis comes back.  High risk medication use - Cosentyx -he will have CBC and CMP drawn in August and every 3 months to monitor for drug toxicity.  Standing orders are in place.  Plan: CBC with Differential/Platelet, COMPLETE METABOLIC PANEL WITH GFR  Status post total knee replacement, left - Dr. Cleophas Dunker.  Doing well.  No warmth or effusion.  Good range of motion.  He has no discomfort in his left knee replacement at this time.  Primary osteoarthritis of right knee: He has been having increased joint pain and joint swelling in his right knee for the past 1 month.  He has no mechanical symptoms.  He has warmth on exam with limited extension.  An x-ray of his right knee was obtained today.  Chronic pain of right knee -he is having increased pain in his right knee joint.  An x-ray of his right knee was obtained today.  He plans on following up with Dr. Cleophas Dunker for management of his right knee pain.  He is considering a right total knee arthroplasty. Plan: XR KNEE 3 VIEW RIGHT   Other medical conditions are listed as follows:  Essential hypertension  Extrapulmonary TB (tuberculosis)  Thrombocytopenia (HCC)  Normocytic anemia    Orders: Orders Placed This Encounter  Procedures  . XR KNEE 3 VIEW RIGHT  . CBC with Differential/Platelet  . COMPLETE METABOLIC PANEL WITH GFR   No orders of the defined types were placed in this encounter.   Face-to-face time  spent with patient was 30 minutes. >50% of time was spent in counseling and coordination of care.  Follow-Up Instructions: Return in about 5 months (around 06/24/2018) for Psoriatic arthritis, Osteoarthritis.   Gearldine Bienenstock, PA-C   I examined and evaluated the patient with Sherron Ales PA.  Patient had no synovitis on examination.  He appears to be doing well on Cosentyx.  He continues to have pain and discomfort in the right knee joint.  The x-ray  today revealed moderate osteoarthritis and chondromalacia patella.  The findings were reviewed with patient.  He would like to proceed with Visco supplement injections at this time.  He is not ready for total knee replacement yet.  We will apply for Visco supplement injections.  The plan of care was discussed as noted above.  Pollyann SavoyShaili Deveshwar, MD  Note - This record has been created using Animal [employer] software.  Chart creation errors have been sought, but may not always  have been located. Such creation errors do not reflect on  the standard of medical care.

## 2018-01-17 ENCOUNTER — Ambulatory Visit: Payer: Medicare Other | Admitting: Rheumatology

## 2018-01-19 ENCOUNTER — Ambulatory Visit: Payer: Medicare Other | Admitting: Physician Assistant

## 2018-01-22 ENCOUNTER — Encounter: Payer: Self-pay | Admitting: Physician Assistant

## 2018-01-22 ENCOUNTER — Ambulatory Visit (INDEPENDENT_AMBULATORY_CARE_PROVIDER_SITE_OTHER): Payer: Self-pay

## 2018-01-22 ENCOUNTER — Ambulatory Visit (INDEPENDENT_AMBULATORY_CARE_PROVIDER_SITE_OTHER): Payer: Medicare Other | Admitting: Rheumatology

## 2018-01-22 ENCOUNTER — Telehealth: Payer: Self-pay

## 2018-01-22 VITALS — BP 113/69 | HR 77 | Resp 12 | Ht 67.0 in | Wt 158.0 lb

## 2018-01-22 DIAGNOSIS — D696 Thrombocytopenia, unspecified: Secondary | ICD-10-CM | POA: Diagnosis not present

## 2018-01-22 DIAGNOSIS — M1711 Unilateral primary osteoarthritis, right knee: Secondary | ICD-10-CM | POA: Diagnosis not present

## 2018-01-22 DIAGNOSIS — D649 Anemia, unspecified: Secondary | ICD-10-CM | POA: Diagnosis not present

## 2018-01-22 DIAGNOSIS — I10 Essential (primary) hypertension: Secondary | ICD-10-CM | POA: Diagnosis not present

## 2018-01-22 DIAGNOSIS — A1889 Tuberculosis of other sites: Secondary | ICD-10-CM

## 2018-01-22 DIAGNOSIS — Z96652 Presence of left artificial knee joint: Secondary | ICD-10-CM

## 2018-01-22 DIAGNOSIS — L409 Psoriasis, unspecified: Secondary | ICD-10-CM

## 2018-01-22 DIAGNOSIS — Z79899 Other long term (current) drug therapy: Secondary | ICD-10-CM | POA: Diagnosis not present

## 2018-01-22 DIAGNOSIS — L4050 Arthropathic psoriasis, unspecified: Secondary | ICD-10-CM

## 2018-01-22 DIAGNOSIS — G8929 Other chronic pain: Secondary | ICD-10-CM | POA: Diagnosis not present

## 2018-01-22 DIAGNOSIS — M25561 Pain in right knee: Secondary | ICD-10-CM | POA: Diagnosis not present

## 2018-01-22 NOTE — Telephone Encounter (Signed)
Please apply for visco for bilateral knees, per Ladona Ridgelaylor. Thanks!

## 2018-01-22 NOTE — Patient Instructions (Signed)
Standing Labs We placed an order today for your standing lab work.    Please come back and get your standing labs in August and every 3 months   We have open lab Monday through Friday from 8:30-11:30 AM and 1:30-4:00 PM  at the office of Dr. Shaili Deveshwar.   You may experience shorter wait times on Monday and Friday afternoons. The office is located at [address], [address], [place], [place] [address] No appointment is necessary.   Labs are drawn by [facility].  You may receive a bill from [facility] for your lab work. If you have any questions regarding directions or hours of operation,  please call [phone].    

## 2018-01-31 ENCOUNTER — Telehealth (INDEPENDENT_AMBULATORY_CARE_PROVIDER_SITE_OTHER): Payer: Self-pay

## 2018-01-31 NOTE — Telephone Encounter (Signed)
Noted  

## 2018-01-31 NOTE — Telephone Encounter (Signed)
Submitted application online for Synvisc injection series, bilateral knee.

## 2018-02-06 ENCOUNTER — Telehealth (INDEPENDENT_AMBULATORY_CARE_PROVIDER_SITE_OTHER): Payer: Self-pay

## 2018-02-06 NOTE — Telephone Encounter (Signed)
Patient approved for Synvisc injection series (6), bilateral knee. Buy & Bill Covered at 100% after Medicare pays No PA required.  Can you please schedule patient appointment?  Thank You.

## 2018-02-08 ENCOUNTER — Telehealth: Payer: Self-pay | Admitting: Rheumatology

## 2018-02-08 MED ORDER — SECUKINUMAB 150 MG/ML ~~LOC~~ SOAJ: 300.0000 mg | SUBCUTANEOUS | 2 refills | Status: DC

## 2018-02-08 NOTE — Telephone Encounter (Signed)
Patient called requesting her prescription of Cosentyx be sent to [facility].

## 2018-02-08 NOTE — Telephone Encounter (Signed)
Last Visit: 01/22/18 Next Visit due November 2019. Message sent to front to schedule patient.  Labs: 12/22/17 Glucose 120. Ca borderline low. RBC borderline low. All other labs are WNL. TB Gold: CXR performed on 08/29/17 stable and no active cardiopulmonary disease noted.  Okay to refill per Dr. Corliss Skainseveshwar

## 2018-02-12 ENCOUNTER — Ambulatory Visit (INDEPENDENT_AMBULATORY_CARE_PROVIDER_SITE_OTHER): Payer: Medicare Other | Admitting: Physician Assistant

## 2018-02-12 DIAGNOSIS — M1711 Unilateral primary osteoarthritis, right knee: Secondary | ICD-10-CM

## 2018-02-12 MED ORDER — LIDOCAINE HCL 1 % IJ SOLN
1.5000 mL | INTRAMUSCULAR | Status: AC | PRN
Start: 2018-02-12 — End: 2018-02-12
  Administered 2018-02-12: 1.5 mL

## 2018-02-12 MED ORDER — HYLAN G-F 20 16 MG/2ML IX SOSY
16.0000 mg | PREFILLED_SYRINGE | INTRA_ARTICULAR | Status: AC | PRN
  Administered 2018-02-12: 16 mg via INTRA_ARTICULAR

## 2018-02-12 NOTE — Progress Notes (Signed)
   Procedure Note  Patient: Liston AlbaJagdishcha Amiri             Date of Birth: Feb 18, 1946           MRN: [id number]             Visit Date: 02/12/2018  Procedures: Visit Diagnoses: Primary osteoarthritis of right knee Synvisc #1 right knee, B/B Large Joint Inj: R knee on 02/12/2018 9:24 AM Indications: pain Details: 22 G 1.5 in needle, medial approach  Arthrogram: No  Medications: 16 mg Hylan 16 MG/2ML; 1.5 mL lidocaine 1 % Aspirate: 0 mL Outcome: tolerated well, no immediate complications Procedure, treatment alternatives, risks and benefits explained, specific risks discussed. Consent was given by the patient. Immediately prior to procedure a time out was called to verify the correct patient, procedure, equipment, support staff and site/side marked as required. Patient was prepped and draped in the usual sterile fashion.     Patient tolerated the procedure well.   Sherron Alesaylor Dale, PA-C

## 2018-02-16 DIAGNOSIS — I10 Essential (primary) hypertension: Secondary | ICD-10-CM | POA: Diagnosis not present

## 2018-02-16 DIAGNOSIS — E785 Hyperlipidemia, unspecified: Secondary | ICD-10-CM | POA: Diagnosis not present

## 2018-02-16 DIAGNOSIS — I2510 Atherosclerotic heart disease of native coronary artery without angina pectoris: Secondary | ICD-10-CM | POA: Diagnosis not present

## 2018-02-19 ENCOUNTER — Ambulatory Visit (INDEPENDENT_AMBULATORY_CARE_PROVIDER_SITE_OTHER): Payer: Medicare Other | Admitting: Physician Assistant

## 2018-02-19 DIAGNOSIS — M1711 Unilateral primary osteoarthritis, right knee: Secondary | ICD-10-CM | POA: Diagnosis not present

## 2018-02-19 MED ORDER — HYLAN G-F 20 16 MG/2ML IX SOSY
16.0000 mg | PREFILLED_SYRINGE | INTRA_ARTICULAR | Status: AC | PRN
  Administered 2018-02-19: 16 mg via INTRA_ARTICULAR

## 2018-02-19 MED ORDER — LIDOCAINE HCL 1 % IJ SOLN
1.5000 mL | INTRAMUSCULAR | Status: AC | PRN
  Administered 2018-02-19: 1.5 mL

## 2018-02-19 NOTE — Patient Instructions (Signed)
Iliotibial Band Syndrome Rehab Ask your health care provider which exercises are safe for you. Do exercises exactly as told by your health care provider and adjust them as directed. It is normal to feel mild stretching, pulling, tightness, or discomfort as you do these exercises, but you should stop right away if you feel sudden pain or your pain gets worse.Do not begin these exercises until told by your health care provider. Stretching and range of motion exercises These exercises warm up your muscles and joints and improve the movement and flexibility of your hip and pelvis. Exercise A: Quadriceps, prone  1. Lie on your abdomen on a firm surface, such as a bed or padded floor. 2. Bend your left / right knee and hold your ankle. If you cannot reach your ankle or pant leg, loop a belt around your foot and grab the belt instead. 3. Gently pull your heel toward your buttocks. Your knee should not slide out to the side. You should feel a stretch in the front of your thigh and knee. 4. Hold this position for __________ seconds. Repeat __________ times. Complete this stretch __________ times a day. Exercise B: Iliotibial band  1. Lie on your side with your left / right leg in the top position. 2. Bend both of your knees and grab your left / right ankle. Stretch out your bottom arm to help you balance. 3. Slowly bring your top knee back so your thigh goes behind your trunk. 4. Slowly lower your top leg toward the floor until you feel a gentle stretch on the outside of your left / right hip and thigh. If you do not feel a stretch and your knee will not fall farther, place the heel of your other foot on top of your knee and pull your knee down toward the floor with your foot. 5. Hold this position for __________ seconds. Repeat __________ times. Complete this stretch __________ times a day. Strengthening exercises These exercises build strength and endurance in your hip and pelvis. Endurance is the  ability to use your muscles for a long time, even after they get tired. Exercise C: Straight leg raises ( hip abductors) 1. Lie on your side with your left / right leg in the top position. Lie so your head, shoulder, knee, and hip line up. You may bend your bottom knee to help you balance. 2. Roll your hips slightly forward so your hips are stacked directly over each other and your left / right knee is facing forward. 3. Tense the muscles in your outer thigh and lift your top leg 4-6 inches (10-15 cm). 4. Hold this position for __________ seconds. 5. Slowly return to the starting position. Let your muscles relax completely before doing another repetition. Repeat __________ times. Complete this exercise __________ times a day. Exercise D: Straight leg raises ( hip extensors) 1. Lie on your abdomen on your bed or a firm surface. You can put a pillow under your hips if that is more comfortable. 2. Bend your left / right knee so your foot is straight up in the air. 3. Squeeze your buttock muscles and lift your left / right thigh off the bed. Do not let your back arch. 4. Tense this muscle as hard as you can without increasing any knee pain. 5. Hold this position for __________ seconds. 6. Slowly lower your leg to the starting position and allow it to relax completely. Repeat __________ times. Complete this exercise __________ times a day. Exercise E: Hip   hike 1. Stand sideways on a bottom step. Stand on your left / right leg with your other foot unsupported next to the step. You can hold onto the railing or wall if needed for balance. 2. Keep your knees straight and your torso square. Then, lift your left / right hip up toward the ceiling. 3. Slowly let your left / right hip lower toward the floor, past the starting position. Your foot should get closer to the floor. Do not lean or bend your knees. Repeat __________ times. Complete this exercise __________ times a day. This information is not  intended to replace advice given to you by your health care provider. Make sure you discuss any questions you have with your health care provider. Document Released: 07/18/2005 Document Revised: 03/22/2016 Document Reviewed: 06/19/2015 Elsevier Interactive Patient Education  2018 [employer] Trochanteric Bursitis Rehab Ask your health care provider which exercises are safe for you. Do exercises exactly as told by your health care provider and adjust them as directed. It is normal to feel mild stretching, pulling, tightness, or discomfort as you do these exercises, but you should stop right away if you feel sudden pain or your pain gets worse.Do not begin these exercises until told by your health care provider. Stretching exercises These exercises warm up your muscles and joints and improve the movement and flexibility of your hip. These exercises also help to relieve pain and stiffness. Exercise A: Iliotibial band stretch  1. Lie on your side with your left / right leg in the top position. 2. Bend your left / right knee and grab your ankle. 3. Slowly bring your knee back so your thigh is behind your body. 4. Slowly lower your knee toward the floor until you feel a gentle stretch on the outside of your left / right thigh. If you do not feel a stretch and your knee will not fall farther, place the heel of your other foot on top of your outer knee and pull your thigh down farther. 5. Hold this position for __________ seconds. 6. Slowly return to the starting position. Repeat __________ times. Complete this exercise __________ times a day. Strengthening exercises These exercises build strength and endurance in your hip and pelvis. Endurance is the ability to use your muscles for a long time, even after they get tired. Exercise B: Bridge ( hip extensors) 1. Lie on your back on a firm surface with your knees bent and your feet flat on the floor. 2. Tighten your buttocks muscles and lift your  buttocks off the floor until your trunk is level with your thighs. You should feel the muscles working in your buttocks and the back of your thighs. If this exercise is too easy, try doing it with your arms crossed over your chest. 3. Hold this position for __________ seconds. 4. Slowly return to the starting position. 5. Let your muscles relax completely between repetitions. Repeat __________ times. Complete this exercise __________ times a day. Exercise C: Squats ( knee extensors and  quadriceps) 1. Stand in front of a table, with your feet and knees pointing straight ahead. You may rest your hands on the table for balance but not for support. 2. Slowly bend your knees and lower your hips like you are going to sit in a chair. ? Keep your weight over your heels, not over your toes. ? Keep your lower legs upright so they are parallel with the table legs. ? Do not let your hips go lower than your   knees. ? Do not bend lower than told by your health care provider. ? If your hip pain increases, do not bend as low. 3. Hold this position for __________ seconds. 4. Slowly push with your legs to return to standing. Do not use your hands to pull yourself to standing. Repeat __________ times. Complete this exercise __________ times a day. Exercise D: Hip hike 1. Stand sideways on a bottom step. Stand on your left / right leg with your other foot unsupported next to the step. You can hold onto the railing or wall if needed for balance. 2. Keeping your knees straight and your torso square, lift your left / right hip up toward the ceiling. 3. Hold this position for __________ seconds. 4. Slowly let your left / right hip lower toward the floor, past the starting position. Your foot should get closer to the floor. Do not lean or bend your knees. Repeat __________ times. Complete this exercise __________ times a day. Exercise E: Single leg stand 1. Stand near a counter or door frame that you can hold onto  for balance as needed. It is helpful to stand in front of a mirror for this exercise so you can watch your hip. 2. Squeeze your left / right buttock muscles then lift up your other foot. Do not let your left / right hip push out to the side. 3. Hold this position for __________ seconds. Repeat __________ times. Complete this exercise __________ times a day. This information is not intended to replace advice given to you by your health care provider. Make sure you discuss any questions you have with your health care provider. Document Released: 08/25/2004 Document Revised: 03/24/2016 Document Reviewed: 07/03/2015 Elsevier Interactive Patient Education  2018 Elsevier Inc[employer]  

## 2018-02-19 NOTE — Progress Notes (Signed)
   Procedure Note  Patient: Alan Roman             Date of Birth: [birth year]           MRN: [id number]             Visit Date: 02/19/2018  Procedures: Visit Diagnoses: Primary osteoarthritis of right knee - Plan: Large Joint Inj: R knee Synvisc #2 right knee B/B Large Joint Inj: R knee on 02/19/2018 8:58 AM Indications: pain Details: 22 G 1.5 in needle, medial approach  Arthrogram: No  Medications: 16 mg Hylan 16 MG/2ML; 1.5 mL lidocaine 1 % Aspirate: 0 mL Outcome: tolerated well, no immediate complications Procedure, treatment alternatives, risks and benefits explained, specific risks discussed. Consent was given by the patient. Immediately prior to procedure a time out was called to verify the correct patient, procedure, equipment, support staff and site/side marked as required. Patient was prepped and draped in the usual sterile fashion.      Patient tolerated the procedure well. He is having right trochanteric bursitis.  We discussed stretching exercises.  He was given a handout of exercises he can perform for trochanteric bursitis and right IT band syndrome.   Sherron Alesaylor Brinnley Lacap, PA-C

## 2018-02-26 ENCOUNTER — Ambulatory Visit (INDEPENDENT_AMBULATORY_CARE_PROVIDER_SITE_OTHER): Payer: Medicare Other | Admitting: Physician Assistant

## 2018-02-26 DIAGNOSIS — M1711 Unilateral primary osteoarthritis, right knee: Secondary | ICD-10-CM

## 2018-02-26 MED ORDER — LIDOCAINE HCL 1 % IJ SOLN
1.5000 mL | INTRAMUSCULAR | Status: AC | PRN
  Administered 2018-02-26: 1.5 mL

## 2018-02-26 MED ORDER — HYLAN G-F 20 16 MG/2ML IX SOSY
16.0000 mg | PREFILLED_SYRINGE | INTRA_ARTICULAR | Status: AC | PRN
  Administered 2018-02-26: 16 mg via INTRA_ARTICULAR

## 2018-02-26 NOTE — Progress Notes (Signed)
   Procedure Note  Patient: Alan Roman             Date of Birth: Feb 09, 1946           MRN: [id number]             Visit Date: 02/26/2018  Procedures: Visit Diagnoses: Primary osteoarthritis of right knee - Plan: Large Joint Inj: R knee  Synvisc #3 Right Knee joint injection   Large Joint Inj: R knee on 02/26/2018 8:03 AM Indications: pain Details: 27 G 1.5 in needle, medial approach  Arthrogram: No  Medications: 16 mg Hylan 16 MG/2ML; 1.5 mL lidocaine 1 % Aspirate: 0 mL Outcome: tolerated well, no immediate complications Procedure, treatment alternatives, risks and benefits explained, specific risks discussed. Consent was given by the patient. Immediately prior to procedure a time out was called to verify the correct patient, procedure, equipment, support staff and site/side marked as required. Patient was prepped and draped in the usual sterile fashion.      Patient tolerated the procedure well. Patient has right IT band syndrome and right piriformis syndrome.  He was advised to schedule an appointment for a cortisone injection.  He was given a handout of stretching exercises he can perform at home at his last vitis.   Sherron Alesaylor Adi Doro, PA-C

## 2018-03-05 ENCOUNTER — Ambulatory Visit: Payer: Medicare Other | Admitting: Physician Assistant

## 2018-03-12 ENCOUNTER — Ambulatory Visit: Payer: Medicare Other | Admitting: Physician Assistant

## 2018-03-19 ENCOUNTER — Ambulatory Visit: Payer: Medicare Other | Admitting: Physician Assistant

## 2018-05-12 DIAGNOSIS — Z23 Encounter for immunization: Secondary | ICD-10-CM | POA: Diagnosis not present

## 2018-05-16 DIAGNOSIS — Z299 Encounter for prophylactic measures, unspecified: Secondary | ICD-10-CM | POA: Diagnosis not present

## 2018-05-16 DIAGNOSIS — Z713 Dietary counseling and surveillance: Secondary | ICD-10-CM | POA: Diagnosis not present

## 2018-05-16 DIAGNOSIS — Z6824 Body mass index (BMI) 24.0-24.9, adult: Secondary | ICD-10-CM | POA: Diagnosis not present

## 2018-05-16 DIAGNOSIS — J209 Acute bronchitis, unspecified: Secondary | ICD-10-CM | POA: Diagnosis not present

## 2018-05-16 DIAGNOSIS — I10 Essential (primary) hypertension: Secondary | ICD-10-CM | POA: Diagnosis not present

## 2018-05-17 DIAGNOSIS — M159 Polyosteoarthritis, unspecified: Secondary | ICD-10-CM | POA: Diagnosis not present

## 2018-05-17 DIAGNOSIS — I10 Essential (primary) hypertension: Secondary | ICD-10-CM | POA: Diagnosis not present

## 2018-05-20 ENCOUNTER — Other Ambulatory Visit: Payer: Self-pay | Admitting: Rheumatology

## 2018-05-21 NOTE — Telephone Encounter (Addendum)
Last Visit: 01/22/18 Next Visit: 06/04/18 Labs: 12/22/17 Glucose 120. Ca borderline low. RBC borderline low. All other labs are WNL. Chest X-ray 08/29/17 Stable chest.  No acute cardiopulmonary process  Left message to advise patient he is due for labs  Okay to refill 30 day supply per Dr. Corliss Skains

## 2018-05-31 ENCOUNTER — Ambulatory Visit (INDEPENDENT_AMBULATORY_CARE_PROVIDER_SITE_OTHER): Payer: Medicare Other | Admitting: Orthopaedic Surgery

## 2018-05-31 ENCOUNTER — Encounter (INDEPENDENT_AMBULATORY_CARE_PROVIDER_SITE_OTHER): Payer: Self-pay | Admitting: Orthopaedic Surgery

## 2018-05-31 VITALS — BP 104/74 | HR 79 | Ht 67.0 in | Wt 158.0 lb

## 2018-05-31 DIAGNOSIS — M1711 Unilateral primary osteoarthritis, right knee: Secondary | ICD-10-CM | POA: Insufficient documentation

## 2018-05-31 NOTE — Progress Notes (Signed)
Office Visit Note   Patient: Alan Roman           Date of Birth: [birth year]           MRN: [id number] Visit Date: 05/31/2018              Requested by: Ignatius Specking, MD [address] [place], [place] [address] PCP: Ignatius Specking, MD   Assessment & Plan: Visit Diagnoses:  1. Unilateral primary osteoarthritis, right knee     Plan: We will schedule right total knee replacement.  Long discussion regarding the surgery.  Has had prior left knee replacement is familiar with the procedure.  Will require clearances from primary care physician and cardiologist  Follow-Up Instructions: Return will schedule right TKR.   Orders:  No orders of the defined types were placed in this encounter.  No orders of the defined types were placed in this encounter.     Procedures: No procedures performed   Clinical Data: No additional findings.   Subjective: Chief Complaint  Patient presents with  . Follow-up    R KNEE PAIN FOR 1 YR, WOULD LIKE TO DISCUSS SURGERY, 10/10/16 HAD INJECTION HELPED FOR 6-8 MO.NO INJURY OR PRIOR SURGERIES   Alan Roman is 72 years old and has been followed for the problem with osteoarthritis in both of his knees.  He had a successful left total knee replacement.  He continues to have a problem with his right knee with evidence of near end-stage osteoarthritis.  Films of his knee in June demonstrate significant tricompartmental degenerative changes with slight varus.  He is reached the point where he is really compromise his activities wants to proceed with a knee replacement HPI  Review of Systems  Constitutional: Negative for fatigue and fever.  HENT: Negative for ear pain.   Eyes: Negative for pain.  Respiratory: Negative for cough and shortness of breath.   Cardiovascular: Positive for leg swelling.  Gastrointestinal: Negative for constipation and diarrhea.  Genitourinary: Negative for difficulty urinating.  Musculoskeletal: Positive for back pain. Negative for  neck pain.  Skin: Negative for rash.  Allergic/Immunologic: Negative for food allergies.  Neurological: Positive for weakness. Negative for numbness.  Hematological: Does not bruise/bleed easily.  Psychiatric/Behavioral: Negative for sleep disturbance.     Objective: Vital Signs: BP 104/74 (BP Location: Left Arm, Patient Position: Sitting, Cuff Size: Normal)   Pulse 79   Ht 5\' 7"  (1.702 m)   Wt 158 lb (71.7 kg)   BMI 24.75 kg/m   Physical Exam  Constitutional: He is oriented to person, place, and time. He appears well-developed and well-nourished.  HENT:  Mouth/Throat: Oropharynx is clear and moist.  Eyes: Pupils are equal, round, and reactive to light. EOM are normal.  Pulmonary/Chest: Effort normal.  Neurological: He is alert and oriented to person, place, and time.  Skin: Skin is warm and dry.  Psychiatric: He has a normal mood and affect. His behavior is normal.    Ortho Exam awake alert and oriented x3.  Comfortable sitting.  He needs a "jump start" to get up from a sitting position referable to his right knee.  Full extension with minimal effusion.  Increased varus with local tenderness along the medial compartment diffusely.  Palpable osteophytes.  Some patellar crepitation.  No instability.  Flexed over 105 degrees.  No calf pain.  No distal edema.  Good pulses.  Straight leg raise negative.  Painless range of motion both hips Specialty Comments:  No specialty comments available.  Imaging: No results found.   PMFS History: Patient Active Problem List   Diagnosis Date Noted  . Unilateral primary osteoarthritis, right knee 05/31/2018  . High risk medication use 12/27/2016  . History of tuberculosis 11/05/2015  . Orthostatic hypotension 11/05/2015  . Constipation 11/05/2015  . Thrombocytopenia (HCC) 11/05/2015  . Normocytic anemia 11/05/2015  . Psoriatic arthritis (HCC) 11/03/2015  . Essential hypertension 11/03/2015  . Osteoarthritis of left knee 11/03/2015  .  Status post total knee replacement, left 11/03/2015  . Transaminitis 02/02/2013  . Extrapulmonary TB (tuberculosis) 02/02/2013  . Pulmonary infiltrate 01/29/2013  . Pleural effusion 01/26/2013  . Acute bronchiolitis due to other infectious organisms 01/26/2013  . Hyponatremia 01/24/2013  . Psoriasis 01/23/2013  . Fever, unknown origin 01/23/2013  . Immunocompromised state (HCC) 01/23/2013   Past Medical History:  Diagnosis Date  . Arthritis    "knees" (11/03/2015)  . History of hiatal hernia 2007; 2009  . Hypertension     History reviewed. No pertinent family history.  Past Surgical History:  Procedure Laterality Date  . CATARACT EXTRACTION W/ INTRAOCULAR LENS  IMPLANT, BILATERAL Bilateral   . HERNIA REPAIR    . HIATAL HERNIA REPAIR  2007; 2009  . JOINT REPLACEMENT    . TOTAL KNEE ARTHROPLASTY Left 11/03/2015  . TOTAL KNEE ARTHROPLASTY Left 11/03/2015   Procedure: Left Total Knee Arthroplasty;  Surgeon: Valeria Batman, MD;  Location: [facility] OR;  Service: Orthopedics;  Laterality: Left;  Marland Kitchen VIDEO BRONCHOSCOPY Bilateral 01/31/2013   Procedure: VIDEO BRONCHOSCOPY WITHOUT FLUORO;  Surgeon: Merwyn Katos, MD;  Location: [facility] ENDOSCOPY;  Service: Cardiopulmonary;  Laterality: Bilateral;   Social History   Occupational History  . Not on file  Tobacco Use  . Smoking status: Never Smoker  . Smokeless tobacco: Never Used  Substance and Sexual Activity  . Alcohol use: No  . Drug use: No  . Sexual activity: Not on file

## 2018-06-04 ENCOUNTER — Other Ambulatory Visit: Payer: Self-pay | Admitting: Rheumatology

## 2018-06-04 ENCOUNTER — Ambulatory Visit (INDEPENDENT_AMBULATORY_CARE_PROVIDER_SITE_OTHER): Payer: Medicare Other | Admitting: Orthopaedic Surgery

## 2018-06-05 ENCOUNTER — Telehealth: Payer: Self-pay | Admitting: Rheumatology

## 2018-06-05 NOTE — Telephone Encounter (Signed)
Ok to refill 30-supply. Please ask when patient is scheduled for a knee replacement with Dr. Cleophas Dunker.

## 2018-06-05 NOTE — Telephone Encounter (Signed)
Last visit: 01/22/18  Next Visit: left message to advise patient to schedule follow up visit.  Labs: 12/22/17 Glucose 120. Ca borderline low. RBC borderline low. All other labs are WNL. Chest X-ray 08/29/17 stable   Left message to advise patient he is due for labs as well as a follow up visit.   Okay to refill 30 day supply Cosentyx?

## 2018-06-05 NOTE — Telephone Encounter (Signed)
Patient's daughter Alan Roman called stating that her dad has white spots which are the size of a half dollar on his ankle, as well as a smaller one on his forehead.  Alan Roman said her dad mentioned it to her when he came to her house today, but has not mentioned it to Dr. Corliss Skains.  Alan Roman is requesting a return call to let her know if this is something that his PCP can treat or if Dr. Corliss Skains needs to see him.

## 2018-06-05 NOTE — Telephone Encounter (Signed)
Patient's knee surgery has not been scheduled as of yet. Patient has a large patch of skin that has lost pigmentation on his outside of his ankle and also on the right side of his forehead. Patient started seeing this about 3 months ago. His daughter would like to know if he should have this checked her or by the dermatologist. Per Sherron Ales, PA-C patient should see dermatology. Patient's daughter advised and will advise patient.

## 2018-06-08 DIAGNOSIS — H9313 Tinnitus, bilateral: Secondary | ICD-10-CM | POA: Diagnosis not present

## 2018-06-08 DIAGNOSIS — H903 Sensorineural hearing loss, bilateral: Secondary | ICD-10-CM | POA: Diagnosis not present

## 2018-06-09 ENCOUNTER — Other Ambulatory Visit: Payer: Self-pay | Admitting: Rheumatology

## 2018-06-11 DIAGNOSIS — L209 Atopic dermatitis, unspecified: Secondary | ICD-10-CM | POA: Diagnosis not present

## 2018-06-11 DIAGNOSIS — R202 Paresthesia of skin: Secondary | ICD-10-CM | POA: Diagnosis not present

## 2018-06-11 DIAGNOSIS — L818 Other specified disorders of pigmentation: Secondary | ICD-10-CM | POA: Diagnosis not present

## 2018-06-18 DIAGNOSIS — Z1331 Encounter for screening for depression: Secondary | ICD-10-CM | POA: Diagnosis not present

## 2018-06-18 DIAGNOSIS — Z6824 Body mass index (BMI) 24.0-24.9, adult: Secondary | ICD-10-CM | POA: Diagnosis not present

## 2018-06-18 DIAGNOSIS — Z79899 Other long term (current) drug therapy: Secondary | ICD-10-CM | POA: Diagnosis not present

## 2018-06-18 DIAGNOSIS — Z1211 Encounter for screening for malignant neoplasm of colon: Secondary | ICD-10-CM | POA: Diagnosis not present

## 2018-06-18 DIAGNOSIS — Z299 Encounter for prophylactic measures, unspecified: Secondary | ICD-10-CM | POA: Diagnosis not present

## 2018-06-18 DIAGNOSIS — Z7189 Other specified counseling: Secondary | ICD-10-CM | POA: Diagnosis not present

## 2018-06-18 DIAGNOSIS — E7800 Pure hypercholesterolemia, unspecified: Secondary | ICD-10-CM | POA: Diagnosis not present

## 2018-06-18 DIAGNOSIS — R5383 Other fatigue: Secondary | ICD-10-CM | POA: Diagnosis not present

## 2018-06-18 DIAGNOSIS — Z125 Encounter for screening for malignant neoplasm of prostate: Secondary | ICD-10-CM | POA: Diagnosis not present

## 2018-06-18 DIAGNOSIS — Z1339 Encounter for screening examination for other mental health and behavioral disorders: Secondary | ICD-10-CM | POA: Diagnosis not present

## 2018-06-18 DIAGNOSIS — I10 Essential (primary) hypertension: Secondary | ICD-10-CM | POA: Diagnosis not present

## 2018-06-18 DIAGNOSIS — R21 Rash and other nonspecific skin eruption: Secondary | ICD-10-CM | POA: Diagnosis not present

## 2018-06-18 DIAGNOSIS — Z0000 Encounter for general adult medical examination without abnormal findings: Secondary | ICD-10-CM | POA: Diagnosis not present

## 2018-06-19 ENCOUNTER — Other Ambulatory Visit: Payer: Self-pay

## 2018-06-19 DIAGNOSIS — Z79899 Other long term (current) drug therapy: Secondary | ICD-10-CM | POA: Diagnosis not present

## 2018-06-20 LAB — COMPLETE METABOLIC PANEL WITH GFR
AG Ratio: 1.4 (calc) (ref 1.0–2.5)
ALT: 18 U/L (ref 9–46)
AST: 29 U/L (ref 10–35)
Albumin: 4 g/dL (ref 3.6–5.1)
Alkaline phosphatase (APISO): 80 U/L (ref 40–115)
BUN: 18 mg/dL (ref 7–25)
CO2: 28 mmol/L (ref 20–32)
Calcium: 9 mg/dL (ref 8.6–10.3)
Chloride: 105 mmol/L (ref 98–110)
Creat: 1.15 mg/dL (ref 0.70–1.18)
GFR, Est African American: 73 mL/min/{1.73_m2} (ref >=60)
GFR, Est Non African American: 63 mL/min/{1.73_m2} (ref >=60)
Globulin: 2.9 g/dL (calc) (ref 1.9–3.7)
Glucose, Bld: 120 mg/dL — ABNORMAL HIGH (ref 65–99)
Potassium: 4.6 mmol/L (ref 3.5–5.3)
Sodium: 139 mmol/L (ref 135–146)
Total Bilirubin: 0.6 mg/dL (ref 0.2–1.2)
Total Protein: 6.9 g/dL (ref 6.1–8.1)

## 2018-06-20 LAB — CBC WITH DIFFERENTIAL/PLATELET
Basophils Absolute: 42 cells/uL (ref 0–200)
Basophils Relative: 0.8 %
Eosinophils Absolute: 120 cells/uL (ref 15–500)
Eosinophils Relative: 2.3 %
HCT: 40.4 % (ref 38.5–50.0)
Hemoglobin: 13.9 g/dL (ref 13.2–17.1)
Lymphs Abs: 1279 cells/uL (ref 850–3900)
MCH: 32 pg (ref 27.0–33.0)
MCHC: 34.4 g/dL (ref 32.0–36.0)
MCV: 93.1 fL (ref 80.0–100.0)
MPV: 10.5 fL (ref 7.5–12.5)
Monocytes Relative: 10.7 %
Neutro Abs: 3203 cells/uL (ref 1500–7800)
Neutrophils Relative %: 61.6 %
Platelets: 190 10*3/uL (ref 140–400)
RBC: 4.34 10*6/uL (ref 4.20–5.80)
RDW: 12.3 % (ref 11.0–15.0)
Total Lymphocyte: 24.6 %
WBC mixed population: 556 cells/uL (ref 200–950)
WBC: 5.2 10*3/uL (ref 3.8–10.8)

## 2018-06-20 NOTE — Progress Notes (Signed)
Glucose is 120. All other labs are WNL.

## 2018-07-02 DIAGNOSIS — Z299 Encounter for prophylactic measures, unspecified: Secondary | ICD-10-CM | POA: Diagnosis not present

## 2018-07-02 DIAGNOSIS — I10 Essential (primary) hypertension: Secondary | ICD-10-CM | POA: Diagnosis not present

## 2018-07-02 DIAGNOSIS — Z6823 Body mass index (BMI) 23.0-23.9, adult: Secondary | ICD-10-CM | POA: Diagnosis not present

## 2018-07-02 DIAGNOSIS — L0201 Cutaneous abscess of face: Secondary | ICD-10-CM | POA: Diagnosis not present

## 2018-07-04 ENCOUNTER — Encounter (INDEPENDENT_AMBULATORY_CARE_PROVIDER_SITE_OTHER): Payer: Self-pay | Admitting: Orthopedic Surgery

## 2018-07-04 ENCOUNTER — Encounter (HOSPITAL_COMMUNITY)
Admission: RE | Admit: 2018-07-04 | Discharge: 2018-07-04 | Disposition: A | Discharge status: Home / Self Care | Payer: Medicare Other | Source: Ambulatory Visit | Attending: Orthopaedic Surgery | Admitting: Orthopaedic Surgery

## 2018-07-04 ENCOUNTER — Ambulatory Visit (INDEPENDENT_AMBULATORY_CARE_PROVIDER_SITE_OTHER): Payer: Medicare Other | Admitting: Orthopedic Surgery

## 2018-07-04 ENCOUNTER — Other Ambulatory Visit: Payer: Self-pay

## 2018-07-04 ENCOUNTER — Encounter (HOSPITAL_COMMUNITY): Payer: Self-pay

## 2018-07-04 VITALS — BP 113/71 | HR 79 | Temp 97.6°F | Resp 16 | Ht 66.5 in | Wt 157.0 lb

## 2018-07-04 DIAGNOSIS — Z01818 Encounter for other preprocedural examination: Secondary | ICD-10-CM | POA: Insufficient documentation

## 2018-07-04 DIAGNOSIS — M1711 Unilateral primary osteoarthritis, right knee: Secondary | ICD-10-CM | POA: Diagnosis not present

## 2018-07-04 LAB — CBC WITH DIFFERENTIAL/PLATELET
Abs Immature Granulocytes: 0.02 10*3/uL (ref 0.00–0.07)
Basophils Absolute: 0 10*3/uL (ref 0.0–0.1)
Basophils Relative: 1 %
Eosinophils Absolute: 0.2 10*3/uL (ref 0.0–0.5)
Eosinophils Relative: 3 %
HCT: 42.7 % (ref 39.0–52.0)
Hemoglobin: 13.8 g/dL (ref 13.0–17.0)
Immature Granulocytes: 0 %
Lymphocytes Relative: 22 %
Lymphs Abs: 1.2 10*3/uL (ref 0.7–4.0)
MCH: 30.9 pg (ref 26.0–34.0)
MCHC: 32.3 g/dL (ref 30.0–36.0)
MCV: 95.5 fL (ref 80.0–100.0)
Monocytes Absolute: 0.7 10*3/uL (ref 0.1–1.0)
Monocytes Relative: 12 %
Neutro Abs: 3.5 10*3/uL (ref 1.7–7.7)
Neutrophils Relative %: 62 %
Platelets: 161 10*3/uL (ref 150–400)
RBC: 4.47 MIL/uL (ref 4.22–5.81)
RDW: 11.8 % (ref 11.5–15.5)
WBC: 5.6 10*3/uL (ref 4.0–10.5)
nRBC: 0 % (ref 0.0–0.2)

## 2018-07-04 LAB — COMPREHENSIVE METABOLIC PANEL
ALT: 19 U/L (ref 0–44)
AST: 29 U/L (ref 15–41)
Albumin: 3.8 g/dL (ref 3.5–5.0)
Alkaline Phosphatase: 75 U/L (ref 38–126)
Anion gap: 9 (ref 5–15)
BUN: 18 mg/dL (ref 8–23)
CO2: 24 mmol/L (ref 22–32)
Calcium: 8.8 mg/dL — ABNORMAL LOW (ref 8.9–10.3)
Chloride: 104 mmol/L (ref 98–111)
Creatinine, Ser: 1.12 mg/dL (ref 0.61–1.24)
GFR calc Af Amer: >60 mL/min (ref >60)
GFR calc non Af Amer: >60 mL/min (ref >60)
Glucose, Bld: 107 mg/dL — ABNORMAL HIGH (ref 70–99)
Potassium: 4.1 mmol/L (ref 3.5–5.1)
Sodium: 137 mmol/L (ref 135–145)
Total Bilirubin: 1 mg/dL (ref 0.3–1.2)
Total Protein: 7.1 g/dL (ref 6.5–8.1)

## 2018-07-04 LAB — URINALYSIS, ROUTINE W REFLEX MICROSCOPIC
Bilirubin Urine: NEGATIVE
Glucose, UA: NEGATIVE mg/dL
Ketones, ur: NEGATIVE mg/dL
Leukocytes, UA: NEGATIVE
Nitrite: NEGATIVE
Protein, ur: NEGATIVE mg/dL
Specific Gravity, Urine: 1.021 (ref 1.005–1.030)
pH: 5 (ref 5.0–8.0)

## 2018-07-04 LAB — PROTIME-INR
INR: 1.01
Prothrombin Time: 13.2 seconds (ref 11.4–15.2)

## 2018-07-04 LAB — TYPE AND SCREEN
ABO/RH(D): B POS
Antibody Screen: NEGATIVE

## 2018-07-04 LAB — APTT: aPTT: 32 seconds (ref 24–36)

## 2018-07-04 LAB — SURGICAL PCR SCREEN
MRSA, PCR: NEGATIVE
Staphylococcus aureus: NEGATIVE

## 2018-07-04 NOTE — Pre-Procedure Instructions (Signed)
Alan Roman  07/04/2018      [id number] - [place], [place] - [address] [place] [phone] Phone: [phone] Fax: [phone]  [facility] - [place], [place] - [address]  [place] [id number] Phone: [phone] Fax: [phone]  [facility] - [place], [place] - [address] [place] [id number] Phone: [phone] Fax: [phone]    Your procedure is scheduled on December 17th.  [facility] Admitting at 8:10 A.M.  Call this number if you have problems the morning of surgery:  [phone]   Remember:  Do not eat or drink after midnight.    7 days prior to surgery STOP taking any Aspirin(unless otherwise instructed by your surgeon), Aleve, Naproxen, Ibuprofen, Motrin, Advil, Goody's, BC's, all herbal medications, fish oil, and all vitamins     Do not wear jewelry.  Do not wear lotions, powders, or perfumes, or deodorant.  Do not shave 48 hours prior to surgery.  Men may shave face and neck.  Do not bring valuables to the hospital.  [facility] is not responsible for any belongings or valuables.   [facility]- Preparing For Surgery  Before surgery, you can play an important role. Because skin is not sterile, your skin needs to be as free of germs as possible. You can reduce the number of germs on your skin by washing with CHG (chlorahexidine gluconate) Soap before surgery.  CHG is an antiseptic cleaner which kills germs and bonds with the skin to continue killing germs even after washing.    Oral Hygiene is also important to reduce your risk of infection.  Remember - BRUSH YOUR TEETH THE MORNING OF SURGERY WITH YOUR REGULAR TOOTHPASTE  Please do not use if you have an allergy to CHG or antibacterial soaps. If your skin becomes reddened/irritated stop  using the CHG.  Do not shave (including legs and underarms) for at least 48 hours prior to first CHG shower. It is OK to shave your face.  Please follow these instructions carefully.   1. Shower the NIGHT BEFORE SURGERY and the MORNING OF SURGERY with CHG.   2. If you chose to wash your hair, wash your hair first as usual with your normal shampoo.  3. After you shampoo, rinse your hair and body thoroughly to remove the shampoo.  4. Use CHG as you would any other liquid soap. You can apply CHG directly to the skin and wash gently with a scrungie or a clean washcloth.   5. Apply the CHG Soap to your body ONLY FROM THE NECK DOWN.  Do not use on open wounds or open sores. Avoid contact with your eyes, ears, mouth and genitals (private parts). Wash Face and genitals (private parts)  with your normal soap.  6. Wash thoroughly, paying special attention to the area where your surgery will be performed.  7. Thoroughly rinse your body with warm water from the neck down.  8. DO NOT shower/wash with your normal soap after using and rinsing off the CHG Soap.  9. Pat yourself dry with a CLEAN TOWEL.  10. Wear CLEAN PAJAMAS to bed the night before surgery, wear comfortable clothes the morning of surgery  11. Place CLEAN SHEETS on your bed the night of your first shower and DO NOT SLEEP WITH PETS.   Day of Surgery:  Do not  apply any deodorants/lotions.  Please wear clean clothes to the hospital/surgery center.   Remember to brush your teeth WITH YOUR REGULAR TOOTHPASTE.   Contacts, dentures or bridgework may not be worn into surgery.  Leave your suitcase in the car.  After surgery it may be brought to your room.  For patients admitted to the hospital, discharge time will be determined by your treatment team.  Patients discharged the day of surgery will not be allowed to drive home.

## 2018-07-04 NOTE — Progress Notes (Signed)
PCP - Doreen Beamhruv vyas MD Cardiologist - Dr. Jacinto HalimGanji  Chest x-ray -  08-29-17 EKG - 07/04/18  Blood Thinner Instructions: N/A Aspirin Instructions: N/A  Anesthesia review: none  Patient denies shortness of breath, fever, cough and chest pain at PAT appointment   Patient verbalized understanding of instructions that were given to them at the PAT appointment. Patient was also instructed that they will need to review over the PAT instructions again at home before surgery.

## 2018-07-04 NOTE — H&P (Addendum)
TOTAL KNEE ADMISSION H&P  Patient is being admitted for right total knee arthroplasty.  Subjective:  Chief Complaint:right knee pain.  HPI: Alan Roman, 72 y.o. male, has a history of pain and functional disability in the right knee due to arthritis and has failed non-surgical conservative treatments for greater than 12 weeks to includeNSAID's and/or analgesics, corticosteriod injections, viscosupplementation injections, flexibility and strengthening excercises and activity modification.  Onset of symptoms was gradual, starting >10 years ago with gradually worsening course since that time. The patient noted no past surgery on the right knee(s).  Patient currently rates pain in the right knee(s) at 8 out of 10 with activity. Patient has night pain, worsening of pain with activity and weight bearing, pain that interferes with activities of daily living, pain with passive range of motion, crepitus and joint swelling.  Patient has evidence of subchondral cysts, subchondral sclerosis, periarticular osteophytes and joint space narrowing by imaging studies. There is no active infection.  Patient Active Problem List   Diagnosis Date Noted  . Unilateral primary osteoarthritis, right knee 05/31/2018  . High risk medication use 12/27/2016  . History of tuberculosis 11/05/2015  . Orthostatic hypotension 11/05/2015  . Constipation 11/05/2015  . Thrombocytopenia (HCC) 11/05/2015  . Normocytic anemia 11/05/2015  . Psoriatic arthritis (HCC) 11/03/2015  . Essential hypertension 11/03/2015  . Osteoarthritis of left knee 11/03/2015  . Status post total knee replacement, left 11/03/2015  . Transaminitis 02/02/2013  . Extrapulmonary TB (tuberculosis) 02/02/2013  . Pulmonary infiltrate 01/29/2013  . Pleural effusion 01/26/2013  . Acute bronchiolitis due to other infectious organisms 01/26/2013  . Hyponatremia 01/24/2013  . Psoriasis 01/23/2013  . Fever, unknown origin 01/23/2013  . Immunocompromised  state (HCC) 01/23/2013   Past Medical History:  Diagnosis Date  . Arthritis    "knees" (11/03/2015)  . History of hiatal hernia 2007; 2009  . Hypertension     Past Surgical History:  Procedure Laterality Date  . CATARACT EXTRACTION W/ INTRAOCULAR LENS  IMPLANT, BILATERAL Bilateral   . HERNIA REPAIR    . HIATAL HERNIA REPAIR  2007; 2009  . JOINT REPLACEMENT    . TOTAL KNEE ARTHROPLASTY Left 11/03/2015  . TOTAL KNEE ARTHROPLASTY Left 11/03/2015   Procedure: Left Total Knee Arthroplasty;  Surgeon: Valeria Batman, MD;  Location: [facility] OR;  Service: Orthopedics;  Laterality: Left;  Marland Kitchen VIDEO BRONCHOSCOPY Bilateral 01/31/2013   Procedure: VIDEO BRONCHOSCOPY WITHOUT FLUORO;  Surgeon: Merwyn Katos, MD;  Location: [facility] ENDOSCOPY;  Service: Cardiopulmonary;  Laterality: Bilateral;    No current facility-administered medications for this encounter.    Current Outpatient Medications  Medication Sig Dispense Refill Last Dose  . COSENTYX SENSOREADY, 300 MG, 150 MG/ML SOAJ INJECT 300MG  (2 PENS) SUBCUTANEOUSLY EVERY 28 DAYS (Patient taking differently: Inject 300 mg into the skin every 28 (twenty-eight) days. ) 2 pen 0 Taking  . ibuprofen (ADVIL,MOTRIN) 200 MG tablet Take 400 mg by mouth every 6 (six) hours as needed for moderate pain.   Taking  . losartan (COZAAR) 25 MG tablet Take 25 mg by mouth daily.    Taking  . triamcinolone ointment (KENALOG) 0.1 % Apply 1 application topically 2 (two) times daily as needed (itching).   Taking  . AMOXICILLIN PO Take by mouth.   Taking   No Known Allergies  Social History   Tobacco Use  . Smoking status: Never Smoker  . Smokeless tobacco: Never Used  Substance Use Topics  . Alcohol use: No    No family history on file.  Review of Systems  Constitutional: Negative for fatigue.  HENT: Negative for trouble swallowing.   Eyes: Negative for pain.  Respiratory: Negative for shortness of breath.   Cardiovascular: Negative for leg swelling.  Gastrointestinal:  Negative for constipation.  Endocrine: Negative for cold intolerance.  Genitourinary: Negative for difficulty urinating.  Musculoskeletal: Positive for joint swelling.  Skin: Negative for rash.  Allergic/Immunologic: Negative for food allergies.  Neurological: Negative for numbness.  Hematological: Does not bruise/bleed easily.  Psychiatric/Behavioral: Negative for sleep disturbance.   Objective:  Physical Exam  Constitutional: He is oriented to person, place, and time. He appears well-developed and well-nourished. No distress.  HENT:  Head: Normocephalic and atraumatic.  Eyes: Pupils are equal, round, and reactive to light. Conjunctivae and EOM are normal.  Neck: Neck supple.  No bruits   Cardiovascular: Normal rate, regular rhythm, normal heart sounds and intact distal pulses.  No murmur heard. Respiratory: Effort normal and breath sounds normal.  GI: Soft. Bowel sounds are normal. There is tenderness.  Neurological: He is alert and oriented to person, place, and time.  Skin: Skin is warm and dry.  Psychiatric: He has a normal mood and affect. His behavior is normal. Judgment and thought content normal.  Musculoskeletal: He does have a mild effusion of the right knee.  Some crepitance with range of motion.  He lacks about 5 degrees of full extension and flexes to about 105 degrees easily.  Calf is supple nontender.  Does have a little bit of pseudolaxity with valgus stressing but Endpoint.  Tender diffusely about the knee but more along the medial joint line.  Vital signs in last 24 hours: Temp:  [97.6 F (36.4 C)] 97.6 F (36.4 C) (12/04 0957) Pulse Rate:  [79] 79 (12/04 0957) Resp:  [16] 16 (12/04 0957) BP: (113)/(71) 113/71 (12/04 0957) Weight:  [71.2 kg] 71.2 kg (12/04 0957)  Labs:   Estimated body mass index is 24.96 kg/m as calculated from the following:   Height as of 07/04/18: 5' 6.5" (1.689 m).   Weight as of 07/04/18: 71.2 kg.   Imaging Review Plain  radiographs demonstrate moderate degenerative joint disease of the right knee(s). The overall alignment isneutral. The bone quality appears to be good for age and reported activity level.   Preoperative templating of the joint replacement has been completed, documented, and submitted to the Operating Room personnel in order to optimize intra-operative equipment management.   Anticipated LOS equal to or greater than 2 midnights due to - Age 73 and older with one or more of the following:  - Obesity  - Expected need for hospital services (PT, OT, Nursing) required for safe  discharge  - Anticipated need for postoperative skilled nursing care or inpatient rehab  - Active co-morbidities: None OR   - Unanticipated findings during/Post Surgery: None  - Patient is a high risk of re-admission due to: None     Assessment/Plan:  End stage arthritis, right knee   The patient history, physical examination, clinical judgment of the provider and imaging studies are consistent with end stage degenerative joint disease of the right knee(s) and total knee arthroplasty is deemed medically necessary. The treatment options including medical management, injection therapy arthroscopy and arthroplasty were discussed at length. The risks and benefits of total knee arthroplasty were presented and reviewed. The risks due to aseptic loosening, infection, stiffness, patella tracking problems, thromboembolic complications and other imponderables were discussed. The patient acknowledged the explanation, agreed to proceed with the plan and consent was  signed. Patient is being admitted for inpatient treatment for surgery, pain control, PT, OT, prophylactic antibiotics, VTE prophylaxis, progressive ambulation and ADL's and discharge planning. The patient is planning to be discharged home with home health services

## 2018-07-04 NOTE — Progress Notes (Signed)
UA +rare bacteria. Inboxed Careers advisersurgeon

## 2018-07-04 NOTE — Progress Notes (Signed)
Office Visit Note   Patient: Alan Roman           Date of Birth: [birth year]           MRN: [id number] Visit Date: 07/04/2018              Requested by: Ignatius Specking, MD [address] [place], [place] [address] PCP: Ignatius Specking, MD   Chief Complaint: right knee pain.  HPI: Alan Roman, 72 y.o. male, has a history of pain and functional disability in the right knee due to arthritis and has failed non-surgical conservative treatments for greater than 12 weeks to includeNSAID's and/or analgesics, corticosteriod injections, viscosupplementation injections, flexibility and strengthening excercises and activity modification.  Onset of symptoms was gradual, starting >10 years ago with gradually worsening course since that time. The patient noted no past surgery on the right knee(s).  Patient currently rates pain in the right knee(s) at 8 out of 10 with activity. Patient has night pain, worsening of pain with activity and weight bearing, pain that interferes with activities of daily living, pain with passive range of motion, crepitus and joint swelling.  Patient has evidence of subchondral cysts, subchondral sclerosis, periarticular osteophytes and joint space narrowing by imaging studies. There is no active infection.  Patient Active Problem List   Diagnosis Date Noted  . Unilateral primary osteoarthritis, right knee 05/31/2018  . High risk medication use 12/27/2016  . History of tuberculosis 11/05/2015  . Orthostatic hypotension 11/05/2015  . Constipation 11/05/2015  . Thrombocytopenia (HCC) 11/05/2015  . Normocytic anemia 11/05/2015  . Psoriatic arthritis (HCC) 11/03/2015  . Essential hypertension 11/03/2015  . Osteoarthritis of left knee 11/03/2015  . Status post total knee replacement, left 11/03/2015  . Transaminitis 02/02/2013  . Extrapulmonary TB (tuberculosis) 02/02/2013  . Pulmonary infiltrate 01/29/2013  . Pleural effusion 01/26/2013  . Acute bronchiolitis due to other  infectious organisms 01/26/2013  . Hyponatremia 01/24/2013  . Psoriasis 01/23/2013  . Fever, unknown origin 01/23/2013  . Immunocompromised state (HCC) 01/23/2013   Past Medical History:  Diagnosis Date  . Arthritis    "knees" (11/03/2015)  . History of hiatal hernia 2007; 2009  . Hypertension     Past Surgical History:  Procedure Laterality Date  . CATARACT EXTRACTION W/ INTRAOCULAR LENS  IMPLANT, BILATERAL Bilateral   . HERNIA REPAIR    . HIATAL HERNIA REPAIR  2007; 2009  . JOINT REPLACEMENT    . TOTAL KNEE ARTHROPLASTY Left 11/03/2015  . TOTAL KNEE ARTHROPLASTY Left 11/03/2015   Procedure: Left Total Knee Arthroplasty;  Surgeon: Valeria Batman, MD;  Location[facility] OR;  Service: Orthopedics;  Laterality: Left;  Marland Kitchen VIDEO BRONCHOSCOPY Bilateral 01/31/2013   Procedure: VIDEO BRONCHOSCOPY WITHOUT FLUORO;  Surgeon: Merwyn Katos, MD;  Location: [facility] ENDOSCOPY;  Service: Cardiopulmonary;  Laterality: Bilateral;    No current facility-administered medications for this encounter.    Current Outpatient Medications  Medication Sig Dispense Refill Last Dose  . COSENTYX SENSOREADY, 300 MG, 150 MG/ML SOAJ INJECT 300MG  (2 PENS) SUBCUTANEOUSLY EVERY 28 DAYS (Patient taking differently: Inject 300 mg into the skin every 28 (twenty-eight) days. ) 2 pen 0 Taking  . ibuprofen (ADVIL,MOTRIN) 200 MG tablet Take 400 mg by mouth every 6 (six) hours as needed for moderate pain.   Taking  . losartan (COZAAR) 25 MG tablet Take 25 mg by mouth daily.    Taking  . triamcinolone ointment (KENALOG) 0.1 % Apply 1 application topically 2 (two) times daily as needed (itching).  Taking  . AMOXICILLIN PO Take by mouth.   Taking   No Known Allergies  Social History   Tobacco Use  . Smoking status: Never Smoker  . Smokeless tobacco: Never Used  Substance Use Topics  . Alcohol use: No    No family history on file.   Review of Systems  Constitutional: Negative for fatigue.  HENT: Negative for trouble swallowing.    Eyes: Negative for pain.  Respiratory: Negative for shortness of breath.   Cardiovascular: Negative for leg swelling.  Gastrointestinal: Negative for constipation.  Endocrine: Negative for cold intolerance.  Genitourinary: Negative for difficulty urinating.  Musculoskeletal: Positive for joint swelling.  Skin: Negative for rash.  Allergic/Immunologic: Negative for food allergies.  Neurological: Negative for numbness.  Hematological: Does not bruise/bleed easily.  Psychiatric/Behavioral: Negative for sleep disturbance.   Objective:  Physical Exam  Constitutional: He is oriented to person, place, and time. He appears well-developed and well-nourished. No distress.  HENT:  Head: Normocephalic and atraumatic.  Eyes: Pupils are equal, round, and reactive to light. Conjunctivae and EOM are normal.  Neck: Neck supple.  No bruits   Cardiovascular: Normal rate, regular rhythm, normal heart sounds and intact distal pulses.  No murmur heard. Respiratory: Effort normal and breath sounds normal.  GI: Soft. Bowel sounds are normal. There is tenderness.  Neurological: He is alert and oriented to person, place, and time.  Skin: Skin is warm and dry.  Psychiatric: He has a normal mood and affect. His behavior is normal. Judgment and thought content normal.  Musculoskeletal: He does have a mild effusion of the right knee.  Some crepitance with range of motion.  He lacks about 5 degrees of full extension and flexes to about 105 degrees easily.  Calf is supple nontender.  Does have a little bit of pseudolaxity with valgus stressing but Endpoint.  Tender diffusely about the knee but more along the medial joint line.  Vital signs in last 24 hours: Temp:  [97.6 F (36.4 C)] 97.6 F (36.4 C) (12/04 0957) Pulse Rate:  [79] 79 (12/04 0957) Resp:  [16] 16 (12/04 0957) BP: (113)/(71) 113/71 (12/04 0957) Weight:  [71.2 kg] 71.2 kg (12/04 0957)  Labs:   Estimated body mass index is 24.96 kg/m as  calculated from the following:   Height as of 07/04/18: 5' 6.5" (1.689 m).   Weight as of 07/04/18: 71.2 kg.   Imaging Review Plain radiographs demonstrate moderate degenerative joint disease of the right knee(s). The overall alignment isneutral. The bone quality appears to be good for age and reported activity level.   Preoperative templating of the joint replacement has been completed, documented, and submitted to the Operating Room personnel in order to optimize intra-operative equipment management.   Anticipated LOS equal to or greater than 2 midnights due to - Age 52 and older with one or more of the following:  - Obesity  - Expected need for hospital services (PT, OT, Nursing) required for safe  discharge  - Anticipated need for postoperative skilled nursing care or inpatient rehab  - Active co-morbidities: None OR   - Unanticipated findings during/Post Surgery: None  - Patient is a high risk of re-admission due to: None     Assessment/Plan:  End stage arthritis, right knee   The patient history, physical examination, clinical judgment of the provider and imaging studies are consistent with end stage degenerative joint disease of the right knee(s) and total knee arthroplasty is deemed medically necessary. The treatment options including medical  management, injection therapy arthroscopy and arthroplasty were discussed at length. The risks and benefits of total knee arthroplasty were presented and reviewed. The risks due to aseptic loosening, infection, stiffness, patella tracking problems, thromboembolic complications and other imponderables were discussed. The patient acknowledged the explanation, agreed to proceed with the plan and consent was signed. Patient is being admitted for inpatient treatment for surgery, pain control, PT, OT, prophylactic antibiotics, VTE prophylaxis, progressive ambulation and ADL's and discharge planning. The patient is planning to be discharged home  with home health services  A discussion of risks benefits and the actual operation showing him the prosthesis type that we would be using.  All questions were answered in detail.  Face-to-face time spent with patient was greater than 40 minutes.  Greater than 50% of the time was spent in counseling and coordination of care.  Oris DroneBrian D. Aleda Granaetrarca, PA-C [facility] [phone]  07/04/2018 10:53 AM

## 2018-07-05 LAB — URINE CULTURE: Culture: NO GROWTH

## 2018-07-06 ENCOUNTER — Inpatient Hospital Stay (HOSPITAL_COMMUNITY)
Admission: RE | Admit: 2018-07-06 | Discharge: 2018-07-06 | Disposition: A | Discharge status: Home / Self Care | Payer: Medicare Other | Source: Ambulatory Visit

## 2018-07-09 DIAGNOSIS — L309 Dermatitis, unspecified: Secondary | ICD-10-CM | POA: Diagnosis not present

## 2018-07-09 DIAGNOSIS — R202 Paresthesia of skin: Secondary | ICD-10-CM | POA: Diagnosis not present

## 2018-07-12 ENCOUNTER — Other Ambulatory Visit: Payer: Self-pay | Admitting: Rheumatology

## 2018-07-12 DIAGNOSIS — L408 Other psoriasis: Secondary | ICD-10-CM

## 2018-07-16 ENCOUNTER — Encounter (INDEPENDENT_AMBULATORY_CARE_PROVIDER_SITE_OTHER): Payer: Self-pay | Admitting: Orthopaedic Surgery

## 2018-07-16 ENCOUNTER — Ambulatory Visit (INDEPENDENT_AMBULATORY_CARE_PROVIDER_SITE_OTHER): Payer: Medicare Other | Admitting: Orthopaedic Surgery

## 2018-07-16 DIAGNOSIS — M25561 Pain in right knee: Secondary | ICD-10-CM

## 2018-07-16 DIAGNOSIS — G8929 Other chronic pain: Secondary | ICD-10-CM

## 2018-07-16 MED ORDER — TRANEXAMIC ACID 1000 MG/10ML IV SOLN
2000.0000 mg | INTRAVENOUS | Status: AC
  Administered 2018-07-17: 2000 mg via TOPICAL
  Filled 2018-07-16: qty 20

## 2018-07-16 NOTE — Progress Notes (Signed)
Office Visit Note   Patient: Alan Roman           Date of Birth: 03/20/1946           MRN: [id number] Visit Date: 07/16/2018              Requested by: Ignatius Specking, MD [address] [place], [place] [address] PCP: Ignatius Specking, MD   Assessment & Plan: Visit Diagnoses:  1. Chronic pain of right knee     Plan: Alan Roman is scheduled for right total knee replacement tomorrow.  He called over the weekend to relate that he was having more pain in his right knee we asked him to come to the office to be sure he was okay.  His right knee looks benign other than some pain from his arthritis.  He has excellent range of motion.  His knee was not hot red warm or swollen.  Full extension and flexion over 120 degrees  Follow-Up Instructions: No follow-ups on file.   Orders:  No orders of the defined types were placed in this encounter.  No orders of the defined types were placed in this encounter.     Procedures: No procedures performed   Clinical Data: No additional findings.   Subjective: No chief complaint on file. Right knee pain.  Scheduled for right total knee replacement in the morning.  Called over the weekend he relates that he was having some pain we asked him to come to the office.  Exam is relatively benign of his right knee without evidence of infection or acute change  HPI  Review of Systems   Objective: Vital Signs: There were no vitals taken for this visit.  Physical Exam  Ortho Exam right knee with full extension and flexion over 120 degrees.  No instability.  Very minimal effusion.  No instability.  Knee was not hot red warm or swollen.  Some venous stasis changes distally but good pulses.  Motor exam intact  Specialty Comments:  No specialty comments available.  Imaging: No results found.   PMFS History: Patient Active Problem List   Diagnosis Date Noted  . Unilateral primary osteoarthritis, right knee 05/31/2018  . High risk medication use  12/27/2016  . History of tuberculosis 11/05/2015  . Orthostatic hypotension 11/05/2015  . Constipation 11/05/2015  . Thrombocytopenia (HCC) 11/05/2015  . Normocytic anemia 11/05/2015  . Psoriatic arthritis (HCC) 11/03/2015  . Essential hypertension 11/03/2015  . Osteoarthritis of left knee 11/03/2015  . Status post total knee replacement, left 11/03/2015  . Transaminitis 02/02/2013  . Extrapulmonary TB (tuberculosis) 02/02/2013  . Pulmonary infiltrate 01/29/2013  . Pleural effusion 01/26/2013  . Acute bronchiolitis due to other infectious organisms 01/26/2013  . Hyponatremia 01/24/2013  . Psoriasis 01/23/2013  . Fever, unknown origin 01/23/2013  . Immunocompromised state (HCC) 01/23/2013   Past Medical History:  Diagnosis Date  . Arthritis    "knees" (11/03/2015)  . History of hiatal hernia 2007; 2009  . Hypertension     History reviewed. No pertinent family history.  Past Surgical History:  Procedure Laterality Date  . CATARACT EXTRACTION W/ INTRAOCULAR LENS  IMPLANT, BILATERAL Bilateral   . HERNIA REPAIR    . HIATAL HERNIA REPAIR  2007; 2009  . JOINT REPLACEMENT    . TOTAL KNEE ARTHROPLASTY Left 11/03/2015  . TOTAL KNEE ARTHROPLASTY Left 11/03/2015   Procedure: Left Total Knee Arthroplasty;  Surgeon: Valeria Batman, MD;  Location[facility] OR;  Service: Orthopedics;  Laterality: Left;  .  VIDEO BRONCHOSCOPY Bilateral 01/31/2013   Procedure: VIDEO BRONCHOSCOPY WITHOUT FLUORO;  Surgeon: Merwyn Katosavid B Simonds, MD;  [facility] END[facility];  Service: Cardiopulmonary;  Laterality: Bilateral;   Social History   Occupational History  . Not on file  Tobacco Use  . Smoking status: Never Smoker  . Smokeless tobacco: Never Used  Substance and Sexual Activity  . Alcohol use: No  . Drug use: No  . Sexual activity: Not on file     Valeria BatmanPeter W Crucita Lacorte, MD   Note - This record has been created using [employer] software.  Chart creation errors have been sought, but may not always  have been  located. Such creation errors do not reflect on  the standard of medical care.

## 2018-07-17 ENCOUNTER — Inpatient Hospital Stay (HOSPITAL_COMMUNITY)
Admission: RE | Admit: 2018-07-17 | Discharge: 2018-07-19 | DRG: 470 | Disposition: A | Discharge status: Home / Self Care | Payer: Medicare Other | Attending: Orthopaedic Surgery | Admitting: Orthopaedic Surgery

## 2018-07-17 ENCOUNTER — Encounter (HOSPITAL_COMMUNITY)
Admission: RE | Disposition: A | Discharge status: Home / Self Care | Payer: Self-pay | Source: Home / Self Care | Attending: Orthopaedic Surgery

## 2018-07-17 ENCOUNTER — Inpatient Hospital Stay (HOSPITAL_COMMUNITY): Payer: Medicare Other | Admitting: Certified Registered Nurse Anesthetist

## 2018-07-17 ENCOUNTER — Encounter (HOSPITAL_COMMUNITY): Payer: Self-pay | Admitting: Certified Registered Nurse Anesthetist

## 2018-07-17 ENCOUNTER — Inpatient Hospital Stay (HOSPITAL_COMMUNITY): Payer: Medicare Other

## 2018-07-17 ENCOUNTER — Other Ambulatory Visit: Payer: Self-pay

## 2018-07-17 DIAGNOSIS — G8918 Other acute postprocedural pain: Secondary | ICD-10-CM | POA: Diagnosis not present

## 2018-07-17 DIAGNOSIS — Z961 Presence of intraocular lens: Secondary | ICD-10-CM | POA: Diagnosis present

## 2018-07-17 DIAGNOSIS — Z96651 Presence of right artificial knee joint: Secondary | ICD-10-CM | POA: Diagnosis not present

## 2018-07-17 DIAGNOSIS — Z8611 Personal history of tuberculosis: Secondary | ICD-10-CM | POA: Diagnosis not present

## 2018-07-17 DIAGNOSIS — Z79899 Other long term (current) drug therapy: Secondary | ICD-10-CM | POA: Diagnosis not present

## 2018-07-17 DIAGNOSIS — M1711 Unilateral primary osteoarthritis, right knee: Principal | ICD-10-CM | POA: Diagnosis present

## 2018-07-17 DIAGNOSIS — Z9841 Cataract extraction status, right eye: Secondary | ICD-10-CM | POA: Diagnosis not present

## 2018-07-17 DIAGNOSIS — D899 Disorder involving the immune mechanism, unspecified: Secondary | ICD-10-CM | POA: Diagnosis not present

## 2018-07-17 DIAGNOSIS — M25561 Pain in right knee: Secondary | ICD-10-CM | POA: Diagnosis not present

## 2018-07-17 DIAGNOSIS — I10 Essential (primary) hypertension: Secondary | ICD-10-CM | POA: Diagnosis not present

## 2018-07-17 DIAGNOSIS — Z96652 Presence of left artificial knee joint: Secondary | ICD-10-CM | POA: Diagnosis not present

## 2018-07-17 DIAGNOSIS — Z9842 Cataract extraction status, left eye: Secondary | ICD-10-CM

## 2018-07-17 DIAGNOSIS — Z01818 Encounter for other preprocedural examination: Secondary | ICD-10-CM

## 2018-07-17 HISTORY — PX: TOTAL KNEE ARTHROPLASTY: SHX125

## 2018-07-17 SURGERY — ARTHROPLASTY, KNEE, TOTAL
Anesthesia: Choice | Site: Knee | Laterality: Right

## 2018-07-17 MED ORDER — CEFAZOLIN SODIUM-DEXTROSE 2-4 GM/100ML-% IV SOLN
INTRAVENOUS | Status: AC
  Filled 2018-07-17: qty 100

## 2018-07-17 MED ORDER — MENTHOL 3 MG MT LOZG: 1.0000 | LOZENGE | OROMUCOSAL | Status: DC | PRN

## 2018-07-17 MED ORDER — ONDANSETRON HCL 4 MG/2ML IJ SOLN
INTRAMUSCULAR | Status: DC | PRN
  Administered 2018-07-17: 4 mg via INTRAVENOUS

## 2018-07-17 MED ORDER — SODIUM CHLORIDE 0.9 % IR SOLN
Status: DC | PRN
  Administered 2018-07-17: 3000 mL

## 2018-07-17 MED ORDER — ONDANSETRON HCL 4 MG/2ML IJ SOLN: 4.0000 mg | Freq: Four times a day (QID) | INTRAMUSCULAR | Status: DC | PRN

## 2018-07-17 MED ORDER — LIDOCAINE 2% (20 MG/ML) 5 ML SYRINGE
INTRAMUSCULAR | Status: AC
  Filled 2018-07-17: qty 5

## 2018-07-17 MED ORDER — KETOROLAC TROMETHAMINE 15 MG/ML IJ SOLN
7.5000 mg | Freq: Four times a day (QID) | INTRAMUSCULAR | Status: AC
  Administered 2018-07-17 – 2018-07-18 (×4): 7.5 mg via INTRAVENOUS
  Filled 2018-07-17 (×3): qty 1

## 2018-07-17 MED ORDER — MIDAZOLAM HCL 2 MG/2ML IJ SOLN
INTRAMUSCULAR | Status: AC
  Administered 2018-07-17: 1 mg via INTRAVENOUS
  Filled 2018-07-17: qty 2

## 2018-07-17 MED ORDER — TRIAMCINOLONE ACETONIDE 0.1 % EX OINT
1.0000 "application " | TOPICAL_OINTMENT | Freq: Two times a day (BID) | CUTANEOUS | Status: DC | PRN
  Filled 2018-07-17: qty 15

## 2018-07-17 MED ORDER — METOCLOPRAMIDE HCL 5 MG/ML IJ SOLN: 5.0000 mg | Freq: Three times a day (TID) | INTRAMUSCULAR | Status: DC | PRN

## 2018-07-17 MED ORDER — FENTANYL CITRATE (PF) 250 MCG/5ML IJ SOLN
INTRAMUSCULAR | Status: AC
  Filled 2018-07-17: qty 5

## 2018-07-17 MED ORDER — CHLORHEXIDINE GLUCONATE 4 % EX LIQD: 60.0000 mL | Freq: Once | CUTANEOUS | Status: DC

## 2018-07-17 MED ORDER — MAGNESIUM HYDROXIDE 400 MG/5ML PO SUSP: 30.0000 mL | Freq: Every day | ORAL | Status: DC | PRN

## 2018-07-17 MED ORDER — CEFAZOLIN SODIUM-DEXTROSE 2-4 GM/100ML-% IV SOLN
2.0000 g | Freq: Four times a day (QID) | INTRAVENOUS | Status: AC
  Administered 2018-07-17 – 2018-07-18 (×2): 2 g via INTRAVENOUS
  Filled 2018-07-17 (×2): qty 100

## 2018-07-17 MED ORDER — SODIUM CHLORIDE 0.9 % IV SOLN
INTRAVENOUS | Status: DC | PRN
  Administered 2018-07-17: 50 ug/min via INTRAVENOUS

## 2018-07-17 MED ORDER — DIPHENHYDRAMINE HCL 12.5 MG/5ML PO ELIX: 12.5000 mg | ORAL_SOLUTION | ORAL | Status: DC | PRN

## 2018-07-17 MED ORDER — METOCLOPRAMIDE HCL 5 MG PO TABS: 5.0000 mg | ORAL_TABLET | Freq: Three times a day (TID) | ORAL | Status: DC | PRN

## 2018-07-17 MED ORDER — FENTANYL CITRATE (PF) 100 MCG/2ML IJ SOLN
INTRAMUSCULAR | Status: AC
  Administered 2018-07-17: 50 ug via INTRAVENOUS
  Filled 2018-07-17: qty 2

## 2018-07-17 MED ORDER — BISACODYL 10 MG RE SUPP: 10.0000 mg | Freq: Every day | RECTAL | Status: DC | PRN

## 2018-07-17 MED ORDER — PROPOFOL 10 MG/ML IV BOLUS
INTRAVENOUS | Status: AC
  Filled 2018-07-17: qty 20

## 2018-07-17 MED ORDER — ACETAMINOPHEN 10 MG/ML IV SOLN
1000.0000 mg | Freq: Four times a day (QID) | INTRAVENOUS | Status: AC
  Administered 2018-07-17 – 2018-07-18 (×4): 1000 mg via INTRAVENOUS
  Filled 2018-07-17 (×4): qty 100

## 2018-07-17 MED ORDER — HYDROMORPHONE HCL 1 MG/ML IJ SOLN
0.2500 mg | INTRAMUSCULAR | Status: DC | PRN
  Administered 2018-07-17 (×2): 0.5 mg via INTRAVENOUS

## 2018-07-17 MED ORDER — LOSARTAN POTASSIUM 25 MG PO TABS
25.0000 mg | ORAL_TABLET | Freq: Every day | ORAL | Status: DC
  Administered 2018-07-18 – 2018-07-19 (×2): 25 mg via ORAL
  Filled 2018-07-17 (×2): qty 1

## 2018-07-17 MED ORDER — METHOCARBAMOL 1000 MG/10ML IJ SOLN
500.0000 mg | Freq: Four times a day (QID) | INTRAVENOUS | Status: DC | PRN
  Filled 2018-07-17: qty 5

## 2018-07-17 MED ORDER — SODIUM CHLORIDE 0.9 % IV SOLN: INTRAVENOUS | Status: DC

## 2018-07-17 MED ORDER — ONDANSETRON HCL 4 MG/2ML IJ SOLN
INTRAMUSCULAR | Status: AC
  Filled 2018-07-17: qty 2

## 2018-07-17 MED ORDER — LIDOCAINE 2% (20 MG/ML) 5 ML SYRINGE
INTRAMUSCULAR | Status: DC | PRN
  Administered 2018-07-17: 60 mg via INTRAVENOUS

## 2018-07-17 MED ORDER — BUPIVACAINE-EPINEPHRINE (PF) 0.25% -1:200000 IJ SOLN
INTRAMUSCULAR | Status: AC
  Filled 2018-07-17: qty 30

## 2018-07-17 MED ORDER — ONDANSETRON HCL 4 MG/2ML IJ SOLN: 4.0000 mg | Freq: Once | INTRAMUSCULAR | Status: DC | PRN

## 2018-07-17 MED ORDER — DEXAMETHASONE SODIUM PHOSPHATE 10 MG/ML IJ SOLN
INTRAMUSCULAR | Status: AC
  Filled 2018-07-17: qty 1

## 2018-07-17 MED ORDER — OXYCODONE HCL 5 MG PO TABS
ORAL_TABLET | ORAL | Status: AC
  Filled 2018-07-17: qty 2

## 2018-07-17 MED ORDER — MAGNESIUM CITRATE PO SOLN: 1.0000 | Freq: Once | ORAL | Status: DC | PRN

## 2018-07-17 MED ORDER — FENTANYL CITRATE (PF) 100 MCG/2ML IJ SOLN
50.0000 ug | Freq: Once | INTRAMUSCULAR | Status: AC
  Administered 2018-07-17: 50 ug via INTRAVENOUS

## 2018-07-17 MED ORDER — PHENYLEPHRINE 40 MCG/ML (10ML) SYRINGE FOR IV PUSH (FOR BLOOD PRESSURE SUPPORT)
PREFILLED_SYRINGE | INTRAVENOUS | Status: AC
  Filled 2018-07-17: qty 10

## 2018-07-17 MED ORDER — FENTANYL CITRATE (PF) 100 MCG/2ML IJ SOLN
INTRAMUSCULAR | Status: DC | PRN
  Administered 2018-07-17 (×7): 25 ug via INTRAVENOUS

## 2018-07-17 MED ORDER — KETOROLAC TROMETHAMINE 15 MG/ML IJ SOLN
INTRAMUSCULAR | Status: AC
  Filled 2018-07-17: qty 1

## 2018-07-17 MED ORDER — HYDROMORPHONE HCL 1 MG/ML IJ SOLN: 0.5000 mg | INTRAMUSCULAR | Status: DC | PRN

## 2018-07-17 MED ORDER — METHOCARBAMOL 500 MG PO TABS
500.0000 mg | ORAL_TABLET | Freq: Four times a day (QID) | ORAL | Status: DC | PRN
  Administered 2018-07-17 – 2018-07-19 (×2): 500 mg via ORAL
  Filled 2018-07-17: qty 1

## 2018-07-17 MED ORDER — LACTATED RINGERS IV SOLN
INTRAVENOUS | Status: DC
  Administered 2018-07-17 (×2): via INTRAVENOUS

## 2018-07-17 MED ORDER — ACETAMINOPHEN 10 MG/ML IV SOLN
INTRAVENOUS | Status: AC
  Filled 2018-07-17: qty 100

## 2018-07-17 MED ORDER — ONDANSETRON HCL 4 MG PO TABS: 4.0000 mg | ORAL_TABLET | Freq: Four times a day (QID) | ORAL | Status: DC | PRN

## 2018-07-17 MED ORDER — MIDAZOLAM HCL 2 MG/2ML IJ SOLN
1.0000 mg | Freq: Once | INTRAMUSCULAR | Status: AC
  Administered 2018-07-17: 1 mg via INTRAVENOUS

## 2018-07-17 MED ORDER — ACETAMINOPHEN 10 MG/ML IV SOLN
1000.0000 mg | Freq: Once | INTRAVENOUS | Status: AC
  Administered 2018-07-17: 1000 mg via INTRAVENOUS

## 2018-07-17 MED ORDER — HYDROMORPHONE HCL 1 MG/ML IJ SOLN
INTRAMUSCULAR | Status: AC
  Administered 2018-07-17: 0.5 mg via INTRAVENOUS
  Filled 2018-07-17: qty 1

## 2018-07-17 MED ORDER — ALUM & MAG HYDROXIDE-SIMETH 200-200-20 MG/5ML PO SUSP: 30.0000 mL | ORAL | Status: DC | PRN

## 2018-07-17 MED ORDER — BUPIVACAINE-EPINEPHRINE (PF) 0.5% -1:200000 IJ SOLN
INTRAMUSCULAR | Status: DC | PRN
  Administered 2018-07-17: 30 mL via PERINEURAL

## 2018-07-17 MED ORDER — OXYCODONE HCL 5 MG PO TABS
5.0000 mg | ORAL_TABLET | ORAL | Status: DC | PRN
  Administered 2018-07-17 – 2018-07-19 (×4): 10 mg via ORAL
  Filled 2018-07-17 (×3): qty 2

## 2018-07-17 MED ORDER — CEFAZOLIN SODIUM-DEXTROSE 2-4 GM/100ML-% IV SOLN
2.0000 g | INTRAVENOUS | Status: AC
  Administered 2018-07-17: 2 g via INTRAVENOUS

## 2018-07-17 MED ORDER — METHOCARBAMOL 500 MG PO TABS
ORAL_TABLET | ORAL | Status: AC
  Filled 2018-07-17: qty 1

## 2018-07-17 MED ORDER — SODIUM CHLORIDE 0.9 % IV SOLN
75.0000 mL/h | INTRAVENOUS | Status: DC
  Administered 2018-07-17: 75 mL/h via INTRAVENOUS

## 2018-07-17 MED ORDER — 0.9 % SODIUM CHLORIDE (POUR BTL) OPTIME
TOPICAL | Status: DC | PRN
  Administered 2018-07-17: 1000 mL

## 2018-07-17 MED ORDER — BUPIVACAINE-EPINEPHRINE 0.25% -1:200000 IJ SOLN
INTRAMUSCULAR | Status: DC | PRN
  Administered 2018-07-17: 30 mL

## 2018-07-17 MED ORDER — MEPERIDINE HCL 50 MG/ML IJ SOLN: 6.2500 mg | INTRAMUSCULAR | Status: DC | PRN

## 2018-07-17 MED ORDER — PHENOL 1.4 % MT LIQD: 1.0000 | OROMUCOSAL | Status: DC | PRN

## 2018-07-17 MED ORDER — DEXAMETHASONE SODIUM PHOSPHATE 10 MG/ML IJ SOLN
INTRAMUSCULAR | Status: DC | PRN
  Administered 2018-07-17: 10 mg via INTRAVENOUS

## 2018-07-17 MED ORDER — PHENYLEPHRINE 40 MCG/ML (10ML) SYRINGE FOR IV PUSH (FOR BLOOD PRESSURE SUPPORT)
PREFILLED_SYRINGE | INTRAVENOUS | Status: DC | PRN
  Administered 2018-07-17: 80 ug via INTRAVENOUS

## 2018-07-17 MED ORDER — ASPIRIN EC 325 MG PO TBEC
325.0000 mg | DELAYED_RELEASE_TABLET | Freq: Every day | ORAL | Status: DC
  Administered 2018-07-18 – 2018-07-19 (×2): 325 mg via ORAL
  Filled 2018-07-17 (×2): qty 1

## 2018-07-17 MED ORDER — PROPOFOL 10 MG/ML IV BOLUS
INTRAVENOUS | Status: DC | PRN
  Administered 2018-07-17: 130 mg via INTRAVENOUS

## 2018-07-17 MED ORDER — DOCUSATE SODIUM 100 MG PO CAPS
100.0000 mg | ORAL_CAPSULE | Freq: Two times a day (BID) | ORAL | Status: DC
  Administered 2018-07-18 – 2018-07-19 (×3): 100 mg via ORAL
  Filled 2018-07-17 (×3): qty 1

## 2018-07-17 SURGICAL SUPPLY — 69 items
BAG DECANTER FOR FLEXI CONT (MISCELLANEOUS) ×2 IMPLANT
BANDAGE ESMARK 6X9 LF (GAUZE/BANDAGES/DRESSINGS) ×1 IMPLANT
BLADE SAGITTAL 25.0X1.19X90 (BLADE) ×2 IMPLANT
BNDG CMPR 9X6 STRL LF SNTH (GAUZE/BANDAGES/DRESSINGS) ×1
BNDG CMPR MED 15X6 ELC VLCR LF (GAUZE/BANDAGES/DRESSINGS) ×1
BNDG ELASTIC 6X15 VLCR STRL LF (GAUZE/BANDAGES/DRESSINGS) ×1 IMPLANT
BNDG ESMARK 6X9 LF (GAUZE/BANDAGES/DRESSINGS) ×2
BOWL SMART MIX CTS (DISPOSABLE) ×2 IMPLANT
CEMENT HV SMART SET (Cement) ×4 IMPLANT
CEMENT TIBIA MBT SIZE 4 (Knees) IMPLANT
COMP FEM CEM STD RT LCS (Orthopedic Implant) ×2 IMPLANT
COMP PATELLA PEGX3 CEM STAN (Knees) ×2 IMPLANT
COMPONENT FEM CEM STD RT LCS (Orthopedic Implant) IMPLANT
COMPONENT PTLLA PEGX3 CEM STAN (Knees) IMPLANT
COVER SURGICAL LIGHT HANDLE (MISCELLANEOUS) ×2 IMPLANT
COVER WAND RF STERILE (DRAPES) ×2 IMPLANT
CUFF TOURNIQUET SINGLE 34IN LL (TOURNIQUET CUFF) ×2 IMPLANT
DECANTER SPIKE VIAL GLASS SM (MISCELLANEOUS) ×2 IMPLANT
DRAPE EXTREMITY T 121X128X90 (DRAPE) ×2 IMPLANT
DRAPE HALF SHEET 40X57 (DRAPES) ×4 IMPLANT
DRSG ADAPTIC 3X8 NADH LF (GAUZE/BANDAGES/DRESSINGS) ×2 IMPLANT
DRSG PAD ABDOMINAL 8X10 ST (GAUZE/BANDAGES/DRESSINGS) ×4 IMPLANT
DURAPREP 26ML APPLICATOR (WOUND CARE) ×4 IMPLANT
ELECT CAUTERY BLADE 6.4 (BLADE) ×2 IMPLANT
ELECT REM PT RETURN 9FT ADLT (ELECTROSURGICAL) ×2
ELECTRODE REM PT RTRN 9FT ADLT (ELECTROSURGICAL) ×1 IMPLANT
FACESHIELD WRAPAROUND (MASK) ×4 IMPLANT
FACESHIELD WRAPAROUND OR TEAM (MASK) ×2 IMPLANT
GAUZE SPONGE 4X4 12PLY STRL (GAUZE/BANDAGES/DRESSINGS) ×2 IMPLANT
GAUZE SPONGE 4X4 12PLY STRL LF (GAUZE/BANDAGES/DRESSINGS) ×1 IMPLANT
GLOVE BIOGEL PI IND STRL 8 (GLOVE) ×1 IMPLANT
GLOVE BIOGEL PI IND STRL 8.5 (GLOVE) ×1 IMPLANT
GLOVE BIOGEL PI INDICATOR 8 (GLOVE) ×1
GLOVE BIOGEL PI INDICATOR 8.5 (GLOVE) ×1
GLOVE ECLIPSE 8.0 STRL XLNG CF (GLOVE) ×4 IMPLANT
GLOVE ECLIPSE 8.5 STRL (GLOVE) ×4 IMPLANT
GOWN STRL REUS W/ TWL LRG LVL3 (GOWN DISPOSABLE) ×2 IMPLANT
GOWN STRL REUS W/TWL 2XL LVL3 (GOWN DISPOSABLE) ×2 IMPLANT
GOWN STRL REUS W/TWL LRG LVL3 (GOWN DISPOSABLE) ×4
HANDPIECE INTERPULSE COAX TIP (DISPOSABLE) ×2
INSERT TIB LCS RP STD 12.5 (Knees) ×1 IMPLANT
KIT BASIN OR (CUSTOM PROCEDURE TRAY) ×2 IMPLANT
KIT TURNOVER KIT B (KITS) ×2 IMPLANT
MANIFOLD NEPTUNE II (INSTRUMENTS) ×2 IMPLANT
NEEDLE 22X1 1/2 (OR ONLY) (NEEDLE) ×2 IMPLANT
NS IRRIG 1000ML POUR BTL (IV SOLUTION) ×2 IMPLANT
PACK TOTAL JOINT (CUSTOM PROCEDURE TRAY) ×2 IMPLANT
PAD ABD 8X10 STRL (GAUZE/BANDAGES/DRESSINGS) ×1 IMPLANT
PAD ARMBOARD 7.5X6 YLW CONV (MISCELLANEOUS) ×4 IMPLANT
PAD CAST 4YDX4 CTTN HI CHSV (CAST SUPPLIES) ×1 IMPLANT
PADDING CAST COTTON 4X4 STRL (CAST SUPPLIES) ×2
PADDING CAST COTTON 6X4 STRL (CAST SUPPLIES) ×2 IMPLANT
PIN STEINMAN FIXATION KNEE (PIN) ×1 IMPLANT
SET HNDPC FAN SPRY TIP SCT (DISPOSABLE) ×1 IMPLANT
STAPLER VISISTAT 35W (STAPLE) ×2 IMPLANT
SUCTION FRAZIER HANDLE 10FR (MISCELLANEOUS) ×1
SUCTION TUBE FRAZIER 10FR DISP (MISCELLANEOUS) ×1 IMPLANT
SURGIFLO W/THROMBIN 8M KIT (HEMOSTASIS) IMPLANT
SUT BONE WAX W31G (SUTURE) ×2 IMPLANT
SUT ETHIBOND NAB CT1 #1 30IN (SUTURE) ×4 IMPLANT
SUT MNCRL AB 3-0 PS2 18 (SUTURE) ×2 IMPLANT
SUT VIC AB 0 CT1 27 (SUTURE) ×2
SUT VIC AB 0 CT1 27XBRD ANBCTR (SUTURE) ×1 IMPLANT
SYR CONTROL 10ML LL (SYRINGE) ×1 IMPLANT
TIBIA MBT CEMENT SIZE 4 (Knees) ×2 IMPLANT
TOWEL OR 17X24 6PK STRL BLUE (TOWEL DISPOSABLE) ×2 IMPLANT
TOWEL OR 17X26 10 PK STRL BLUE (TOWEL DISPOSABLE) ×2 IMPLANT
TRAY FOLEY BAG SILVER LF 16FR (SET/KITS/TRAYS/PACK) ×2 IMPLANT
WRAP KNEE MAXI GEL POST OP (GAUZE/BANDAGES/DRESSINGS) ×2 IMPLANT

## 2018-07-17 NOTE — Anesthesia Preprocedure Evaluation (Signed)
Anesthesia Evaluation  Patient identified by MRN, date of birth, ID band Patient awake    Reviewed: Allergy & Precautions, NPO status , Patient's Chart, lab work & pertinent test results  Airway Mallampati: I  TM Distance: >3 FB Neck ROM: Full    Dental  (+) Teeth Intact, Caps   Pulmonary neg pulmonary ROS,    breath sounds clear to auscultation       Cardiovascular hypertension, Pt. on medications  Rhythm:Regular Rate:Normal     Neuro/Psych negative neurological ROS  negative psych ROS   GI/Hepatic negative GI ROS, Neg liver ROS,   Endo/Other  negative endocrine ROS  Renal/GU negative Renal ROS  negative genitourinary   Musculoskeletal  (+) Arthritis , Osteoarthritis,  OA right knee   Abdominal   Peds negative pediatric ROS (+)  Hematology  (+) anemia ,   Anesthesia Other Findings   Reproductive/Obstetrics negative OB ROS                             Lab Results  Component Value Date   WBC 5.6 07/04/2018   HGB 13.8 07/04/2018   HCT 42.7 07/04/2018   MCV 95.5 07/04/2018   PLT 161 07/04/2018   Lab Results  Component Value Date   CREATININE 1.12 07/04/2018   BUN 18 07/04/2018   NA 137 07/04/2018   K 4.1 07/04/2018   CL 104 07/04/2018   CO2 24 07/04/2018   Lab Results  Component Value Date   INR 1.01 07/04/2018    09/2015 EKG: normal sinus rhythm, frequent PVC's noted.   Anesthesia Physical  Anesthesia Plan  ASA: II  Anesthesia Plan:    Post-op Pain Management: GA combined w/ Regional for post-op pain   Induction: Intravenous  PONV Risk Score and Plan: 3 and Ondansetron, Dexamethasone and Treatment may vary due to age or medical condition  Airway Management Planned: LMA  Additional Equipment:   Intra-op Plan:   Post-operative Plan: Extubation in OR  Informed Consent: I have reviewed the patients History and Physical, chart, labs and discussed the procedure  including the risks, benefits and alternatives for the proposed anesthesia with the patient or authorized representative who has indicated his/her understanding and acceptance.   Dental advisory given  Plan Discussed with: CRNA and Surgeon  Anesthesia Plan Comments:         Anesthesia Quick Evaluation

## 2018-07-17 NOTE — Anesthesia Procedure Notes (Signed)
Anesthesia Regional Block: Adductor canal block   Pre-Anesthetic Checklist: ,, timeout performed, Correct Patient, Correct Site, Correct Laterality, Correct Procedure, Correct Position, site marked, Risks and benefits discussed,  Surgical consent,  Pre-op evaluation,  At surgeon's request and post-op pain management  Laterality: Right  Prep: chloraprep       Needles:  Injection technique: Single-shot  Needle Type: Echogenic Stimulator Needle     Needle Length: 9cm  Needle Gauge: 21   Needle insertion depth: 5 cm   Additional Needles:   Procedures:,,,, ultrasound used (permanent image in chart),,,,  Narrative:  Start time: 07/17/2018 9:40 AM End time: 07/17/2018 9:45 AM Injection made incrementally with aspirations every 5 mL.  Performed by: Personally  Anesthesiologist: Mal AmabileFoster, Celinda Dethlefs, MD  Additional Notes: Timeout performed. Patient sedated. Relevant anatomy ID'd using US. Incremental 2-495ml injection of LA with frequent aspiration. Patient tolerated procedure well.        Right Adductor Canal Block

## 2018-07-17 NOTE — Op Note (Signed)
NAMListon Alba: Schnorr, JAGDISHCHA MEDICAL RECORD [id number] ACCOUNT [id number] DATE OF BIRTH:July 26, 1946 FACILITY: [facility] LOCATION: [facility] PHYSICIAN:Jarius Dieudonne Kathi DerW. Kennet Mccort, MD  OPERATIVE REPORT  DATE OF PROCEDURE:  07/17/2018  PREOPERATIVE DIAGNOSIS:  End-stage osteoarthritis, right knee.  POSTOPERATIVE DIAGNOSIS:  End-stage osteoarthritis, right knee.  PROCEDURE:  Right total knee replacement.  SURGEON:  Norlene CampbellPeter Boyde Grieco, MD  ASSISTANT:  Tenna DelaineBarbara Petrarca, PA-C.  ANESTHESIA:  General with supplemental adductor canal block.  COMPLICATIONS:  None.  COMPONENTS:  DePuy LCS standard femoral component, a 12.5 mm polyethylene bridging bearing, a 4 rotating keeled tibial tray, metal backed 3 peg patella.  Components were cemented.  DESCRIPTION OF PROCEDURE:  The patient was evaluated with a family member in the holding area and identified the right knee as appropriate operative site and marked it accordingly.  Anesthesia performed an adductor canal block.  Any questions were answered.  The patient was then transported to room 7.  He was comfortably placed under general anesthesia without difficulty.  The right lower extremity was then placed in a thigh tourniquet.  The leg was prepped with chlorhexidine scrub and DuraPrep x2 from the  tourniquet to the tips of the toes.  Sterile draping was performed.  Two grams of Ancef were administered IV.  A timeout was called.  The right lower extremity was then elevated.  The leg was Esmarched exsanguinated and the tourniquet applied to 300 mmHg.  A midline longitudinal incision was made centered about the patella extending from the superior pouch to the tibial tubercle.  Via sharp dissection, incision was carried down to subcutaneous tissue.  First layer of capsule was incised in the midline.  A  medial parapatellar incision was then made through the deep capsule.  There was minimal effusion.  The patella was everted 180 degrees laterally, the knee flexed to  90 degrees.  There was a moderate amount of beefy synovium.  A synovectomy was performed.  There were moderately large osteophytes along the medial and lateral femoral condyle and complete  absence of articular cartilage on the medial femoral condyle.  Osteophytes were removed and we measured a standard femoral component.  First bony cut was then made transversely on the tibia with the external tibial guide with a 7 degree angle of declination.  After each bony cut on the femur and the tibia, we checked to make sure we had appropriate alignment.  Subsequent cuts were then made on the femur with the standard femoral jig.  I used a 4 degree distal femoral valgus cut.  Finishing guide was then applied for tapering cuts.  The retractors were then placed along the medial aspect of the joint.  Laminar spreaders were inserted.  I removed medial and lateral menisci as well as ACL and PCL.  Osteophytes were removed from the  posterior femoral condyle using a 3/4-inch curved osteotome.  There were no loose bodies.  Retractors were then placed behind the tibia carefully.  We advanced anteriorly.  I measured a 4 tibial tray.  This was pinned in place and alignment checked.  The center hole was then made followed by the keeled cut.  With the tibial trial jig in place, we applied the 12.5 mm bridging weightbearing as our flexion and extension gaps were symmetrical at 12.5 mm.  MCL and LCL remained intact throughout the procedure.  The standard femoral trial component was then  applied.  The knee was then ranged without instability.  There was no opening with varus or valgus stress at full extension and flexion over  120 degrees without malrotation of any of the components.  The patella was then prepared by removing 10 mm of bone leaving 14 mm of patella thickness.  The trial patellar jig was then applied.  The 3 holes made.  The trial patella inserted and reduced.  Through a full range of motion there was no  instability.  The trial components were then removed.  The joint was copiously irrigated with saline solution.  Retractors then placed about the tibia.  The final 4 tibial tray was then impacted with methacrylate.  Extraneous methacrylate was removed from the periphery of the component.  Then, a 12.5 mm polyethylene bridging bearing was then applied followed by  the standard femoral component with methacrylate.  The knee placed in full extension and again any extraneous methacrylate was removed from the periphery of the components.  The patella was applied with methacrylate and a patellar clamp.  At approximately 16 minutes methacrylate had matured.  During this time, we injected the capsule with 0.25% Marcaine with epinephrine.  Tourniquet was deflated at 62 minutes.  Any gross bleeders were Bovie coagulated.  We applied the tranexamic acid topically under compression.  I thought we had a nice dry field with excellent capillary refill.  The deep capsule was then closed with a running #1 Ethibond, the superficial capsule with Vicryl, subcutaneous with 3-0 Monocryl.  Skin closed with skin clips.  A sterile bulky dressing was applied followed by the patient's support stocking.  The patient was awoken and returned to the postanesthesia recovery room without complications.  TN/NUANCE  D:07/17/2018 T:07/17/2018 JOB:[id number]

## 2018-07-17 NOTE — Progress Notes (Signed)
Orthopedic Tech Progress Note Patient Details:  Alan AlbaJagdishcha Roman [birth year] [id number]  CPM Right Knee CPM Right Knee: On Right Knee Flexion (Degrees): 90 Right Knee Extension (Degrees): 0  Post Interventions Patient Tolerated: Well Instructions Provided: Care of device, Adjustment of device  [name], [name] 07/17/2018, 12:59 PM

## 2018-07-17 NOTE — Consult Note (Signed)
Medical Consultation   Army FossaJagdishcha Roman  FAO:[id number]  DOB: 01-19-1946  DOA: 07/17/2018  PCP: Ignatius SpeckingVyas, Dhruv B, MD   Outpatient Specialists:  [facility] - orthopedics; Deveshwar - rheumatology;    Requesting physician: Cleophas DunkerWhitfield - orthopedics  Reason for consultation: Knee replacement - needs medical management   History of Present Illness: Alan Roman is an 72 y.o. male with h/o HTN presenting for TKR.  He previously had an uncomplicated left TKR in 10/2015; he did have mild orthostatic hypotension postoperatively that resolved with IVF.  At this time, he is having post-operative pain in his right knee but is otherwise without complaint.   Review of Systems:  ROS As per HPI otherwise 10 point review of systems negative.    Past Medical History: Past Medical History:  Diagnosis Date  . Arthritis    "knees" (11/03/2015)  . History of hiatal hernia 2007; 2009  . Hypertension     Past Surgical History: Past Surgical History:  Procedure Laterality Date  . CATARACT EXTRACTION W/ INTRAOCULAR LENS  IMPLANT, BILATERAL Bilateral   . HERNIA REPAIR    . HIATAL HERNIA REPAIR  2007; 2009  . JOINT REPLACEMENT    . TOTAL KNEE ARTHROPLASTY Left 11/03/2015  . TOTAL KNEE ARTHROPLASTY Left 11/03/2015   Procedure: Left Total Knee Arthroplasty;  Surgeon: Valeria BatmanPeter W Whitfield, MD;  [facility];  Service: Orthopedics;  Laterality: Left;  Marland Kitchen. VIDEO BRONCHOSCOPY Bilateral 01/31/2013   Procedure: VIDEO BRONCHOSCOPY WITHOUT FLUORO;  Surgeon: Merwyn Katosavid B Simonds, MD;  [facility];  Service: Cardiopulmonary;  Laterality: Bilateral;     Allergies:  No Known Allergies   Social History:  reports that he has never smoked. He has never used smokeless tobacco. He reports that he does not drink alcohol or use drugs.   Family History: History reviewed. No pertinent family history.    Physical Exam: Vitals:   07/17/18 1319 07/17/18 1334 07/17/18 1349 07/17/18 1358  BP:  (!) 129/92 (!) 142/94 (!) 147/102 (!) 142/94  Pulse: 90 92 88   Resp: 13 20 10    Temp:      TempSrc:      SpO2: 100% 100% 99%   Weight:      Height:        Constitutional: Alert and awake, oriented x3, not in any acute distress. Eyes: PERLA, EOMI, irises appear normal, anicteric sclera,  ENMT: external ears and nose appear normal, normal hearing, Lips appear normal, oropharynx mucosa, tongue appear normal  Neck: neck appears normal, no masses, normal ROM, no thyromegaly, no JVD  CVS: S1-S2 clear, no murmur rubs or gallops, no LE edema, normal pedal pulses  Respiratory:  clear to auscultation bilaterally, no wheezing, rales or rhonchi. Respiratory effort normal. No accessory muscle use.  Abdomen: soft nontender, nondistended Musculoskeletal: : no cyanosis, clubbing or edema noted bilaterally; R leg was actively being placed into the CPM machine Neuro: grossly intact Psych: judgement and insight appear normal, stable mood and affect, mental status   Data reviewed:  I have personally reviewed the recent labs and imaging studies  Pertinent Labs:   Glucose 107 Normal CBC UA: small Hgb   Inpatient Medications:   Scheduled Meds: . chlorhexidine  60 mL Topical Once  . chlorhexidine  60 mL Topical Once   Continuous Infusions: . sodium chloride    . lactated ringers 10 mL/hr at 07/17/18 0950     Radiological Exams on Admission: Dg  Chest 2 View  Result Date: 07/17/2018 CLINICAL DATA:  Preop right total knee.  Hypertension. 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 Heart is borderline in size. No confluent airspace opacities or effusions. No acute bony abnormality. IMPRESSION: No active cardiopulmonary disease. Electronically Signed   By: Charlett Nose M.D.   On: 07/17/2018 09:27    Impression/Recommendations Principal Problem:   S/P total knee replacement using cement, right Active Problems:   Essential  hypertension  S/p R TKR -Management as per orthopedics -Plan appears to include SNF placement for rehab  HTN -Continue Cozaar -Will monitor  Thank you for this consultation.  Our [facility] hospitalist team will follow the patient with you through at least tomorrow.   Time Spent: 30 minutes  Jonah Blue M.D. [facility] 07/17/2018, 2:21 PM

## 2018-07-17 NOTE — Transfer of Care (Signed)
Immediate Anesthesia Transfer of Care Note  Patient: Alan Roman  Procedure(s) Performed: RIGHT TOTAL KNEE ARTHROPLASTY (Right Knee)  Patient Location: PACU  Anesthesia Type:GA combined with regional for post-op pain  Level of Consciousness: awake, alert  and oriented  Airway & Oxygen Therapy: Patient Spontanous Breathing and Patient connected to face mask oxygen  Post-op Assessment: Report given to RN and Post -op Vital signs reviewed and stable  Post vital signs: Reviewed and stable  Last Vitals:  Vitals Value Taken Time  BP 131/104 07/17/2018 12:04 PM  Temp    Pulse 106 07/17/2018 12:07 PM  Resp 20 07/17/2018 12:07 PM  SpO2 95 % 07/17/2018 12:07 PM  Vitals shown include unvalidated device data.  Last Pain:  Vitals:   07/17/18 0920  TempSrc:   PainSc: 0-No pain      Patients Stated Pain Goal: 3 (07/17/18 0920)  Complications: No apparent anesthesia complications

## 2018-07-17 NOTE — Plan of Care (Signed)

## 2018-07-17 NOTE — Anesthesia Postprocedure Evaluation (Signed)
Anesthesia Post Note  Patient: Alan Roman  Procedure(s) Performed: RIGHT TOTAL KNEE ARTHROPLASTY (Right Knee)     Patient location during evaluation: PACU Anesthesia Type: General Level of consciousness: awake and alert and oriented Pain management: pain level controlled Vital Signs Assessment: post-procedure vital signs reviewed and stable Respiratory status: spontaneous breathing, nonlabored ventilation and respiratory function stable Cardiovascular status: blood pressure returned to baseline and stable Postop Assessment: no apparent nausea or vomiting Anesthetic complications: no    Last Vitals:  Vitals:   07/17/18 1234 07/17/18 1249  BP: 132/85 100/65  Pulse: 88 90  Resp: 10 15  Temp:    SpO2: 100% 99%    Last Pain:  Vitals:   07/17/18 1229  TempSrc:   PainSc: Asleep                 Emanuell Morina A.

## 2018-07-17 NOTE — Plan of Care (Signed)
  Problem: Education: Goal: Knowledge of General Education information will improve Description: Including pain rating scale, medication(s)/side effects and non-pharmacologic comfort measures Outcome: Progressing   Problem: Activity: Goal: Risk for activity intolerance will decrease Outcome: Progressing   Problem: Coping: Goal: Level of anxiety will decrease Outcome: Progressing   Problem: Pain Managment: Goal: General experience of comfort will improve Outcome: Progressing   Problem: Skin Integrity: Goal: Risk for impaired skin integrity will decrease Outcome: Progressing   

## 2018-07-17 NOTE — Progress Notes (Signed)
The recent History & Physical has been reviewed. I have personally examined the patient today. There is no interval change to the documented History & Physical. The patient would like to proceed with the procedure.  [name] 07/17/2018,  9:30 AM  Patient ID: Alan Roman, male   DOB: 16-Feb-1946, 72 y.o.   MRN: [id number]

## 2018-07-17 NOTE — Anesthesia Procedure Notes (Signed)
Procedure Name: LMA Insertion Date/Time: 07/17/2018 10:03 AM Performed by: Pearson Grippeobertson, Cyntia Staley M, CRNA Pre-anesthesia Checklist: Patient identified, Emergency Drugs available, Suction available and Patient being monitored Patient Re-evaluated:Patient Re-evaluated prior to induction Oxygen Delivery Method: Circle system utilized Preoxygenation: Pre-oxygenation with 100% oxygen Induction Type: IV induction Ventilation: Mask ventilation without difficulty LMA: LMA inserted LMA Size: 4.0 Number of attempts: 1 Airway Equipment and Method: Bite block Placement Confirmation: positive ETCO2 Tube secured with: Tape Dental Injury: Teeth and Oropharynx as per pre-operative assessment

## 2018-07-17 NOTE — Progress Notes (Signed)
PATIENT ID:      Liston AlbaJagdishcha Tanguma  MRN:     [id number] DOB/AGE:    1946-01-09 / 72 y.o.       OPERATIVE REPORT    DATE OF PROCEDURE:  07/17/2018       PREOPERATIVE DIAGNOSIS:END STAGE   RIGHT KNEE OSTEOARTHRITIS                                                       Estimated body mass index is 23.53 kg/m as calculated from the following:   Height as of this encounter: 5' 6.5" (1.689 m).   Weight as of this encounter: 67.1 kg.     POSTOPERATIVE DIAGNOSIS: END STAGE  RIGHT KNEE OSTEOARTHRITIS                                                                     Estimated body mass index is 23.53 kg/m as calculated from the following:   Height as of this encounter: 5' 6.5" (1.689 m).   Weight as of this encounter: 67.1 kg.     PROCEDURE:  Procedure(s): RIGHT TOTAL KNEE ARTHROPLASTY     SURGEON:  Norlene CampbellPeter Charlayne Vultaggio, MD    ASSISTANT:   Jacqualine CodeBrian Petrarca, PA-C   (Present and scrubbed throughout the case, critical for assistance with exposure, retraction, instrumentation, and closure.)          ANESTHESIA: regional and general     DRAINS: none :      TOURNIQUET TIME:  Total Tourniquet Time Documented: Thigh (Right) - 63 minutes Total: Thigh (Right) - 63 minutes     COMPLICATIONS:  None   CONDITION:  stable  PROCEDURE IN DETAIL: [id number]   Valeria Batmaneter W Venessa Wickham 07/17/2018, 11:40 AM  Patient ID: Liston AlbaJagdishcha Kuroda, male   DOB: 1946-01-09, 72 y.o.   MRN: [id number]

## 2018-07-17 NOTE — Evaluation (Signed)
Physical Therapy Evaluation Patient Details Name: Alan Roman MRN: [id number] DOB: [birth year] Today's Date: 07/17/2018   History of Present Illness  Pt is a 72 y.o. male s/p elective R TKA (with cement; 50% PWB) on 07/17/18. PMH includes L TKA (10/2015), HTN.  Clinical Impression  Pt presents with an overall decrease in functional mobility secondary to above. PTA, pt indep and lives with wife; will have multiple family members available for support. Educ on 50% PWB precautions, positioning, therex, and importance of mobility. Today, pt able to transfer and ambulate well with RW at supervision-level. Pt plans to return home with HHPT services. Will follow acutely to address established goals.    Follow Up Recommendations Follow surgeon's recommendation for DC plan and follow-up therapies;Supervision for mobility/OOB    Equipment Recommendations  None recommended by PT    Recommendations for Other Services       Precautions / Restrictions Precautions Precautions: Fall Restrictions Weight Bearing Restrictions: Yes RLE Weight Bearing: Partial weight bearing RLE Partial Weight Bearing Percentage or Pounds: 50      Mobility  Bed Mobility Overal bed mobility: Independent                Transfers Overall transfer level: Needs assistance Equipment used: Rolling walker (2 wheeled) Transfers: Sit to/from Stand Sit to Stand: Supervision         General transfer comment: Cues for correct hand placement; cues for 50% PWB  Ambulation/Gait Ambulation/Gait assistance: Supervision Gait Distance (Feet): 60 Feet Assistive device: Rolling walker (2 wheeled) Gait Pattern/deviations: Step-through pattern;Decreased stride length;Decreased weight shift to right;Antalgic Gait velocity: Decreased Gait velocity interpretation: 1.31 - 2.62 ft/sec, indicative of limited community ambulator General Gait Details: Slow, steady amb with RW and min guard, progressing to supervision. Seems  to have good ability to maintain 50% PWB, if not putting even less weight through R knee  Stairs            Wheelchair Mobility    Modified Rankin (Stroke Patients Only)       Balance Overall balance assessment: Needs assistance   Sitting balance-Leahy Scale: Good       Standing balance-Leahy Scale: Poor                               Pertinent Vitals/Pain Pain Assessment: Faces Faces Pain Scale: Hurts little more Pain Location: R knee Pain Descriptors / Indicators: Discomfort Pain Intervention(s): Limited activity within patient's tolerance;Monitored during session    Home Living Family/patient expects to be discharged to:: Private residence Living Arrangements: Spouse/significant other Available Help at Discharge: Family;Available 24 hours/day Type of Home: House Home Access: Level entry     Home Layout: One level Home Equipment: Walker - 2 wheels;Bedside commode      Prior Function Level of Independence: Independent               Hand Dominance        Extremity/Trunk Assessment   Upper Extremity Assessment Upper Extremity Assessment: Overall WFL for tasks assessed    Lower Extremity Assessment Lower Extremity Assessment: RLE deficits/detail RLE Deficits / Details: s/p R TKA; strength grossly >3/5    Cervical / Trunk Assessment Cervical / Trunk Assessment: Normal  Communication   Communication: No difficulties  Cognition Arousal/Alertness: Awake/alert Behavior During Therapy: WFL for tasks assessed/performed Overall Cognitive Status: Within Functional Limits for tasks assessed  General Comments General comments (skin integrity, edema, etc.): Son present and supportive    Exercises Total Joint Exercises Heel Slides: AROM;Right;Supine   Assessment/Plan    PT Assessment Patient needs continued PT services  PT Problem List Decreased strength;Decreased range of  motion;Decreased activity tolerance;Decreased balance;Decreased mobility;Decreased knowledge of use of DME;Decreased knowledge of precautions;Pain       PT Treatment Interventions DME instruction;Gait training;Stair training;Functional mobility training;Therapeutic activities;Therapeutic exercise;Balance training;Patient/family education    PT Goals (Current goals can be found in the Care Plan section)  Acute Rehab PT Goals Patient Stated Goal: Return home to be with family over holidays PT Goal Formulation: With patient Time For Goal Achievement: 07/31/18 Potential to Achieve Goals: Good    Frequency 7X/week   Barriers to discharge        Co-evaluation               AM-PAC PT "6 Clicks" Mobility  Outcome Measure Help needed turning from your back to your side while in a flat bed without using bedrails?: None Help needed moving from lying on your back to sitting on the side of a flat bed without using bedrails?: None Help needed moving to and from a bed to a chair (including a wheelchair)?: A Little Help needed standing up from a chair using your arms (e.g., wheelchair or bedside chair)?: A Little Help needed to walk in hospital room?: A Little Help needed climbing 3-5 steps with a railing? : A Little 6 Click Score: 20    End of Session Equipment Utilized During Treatment: Gait belt Activity Tolerance: Patient tolerated treatment well Patient left: in bed;with call bell/phone within reach;with family/visitor present;with nursing/sitter in room Nurse Communication: Mobility status PT Visit Diagnosis: Other abnormalities of gait and mobility (R26.89);Pain Pain - Right/Left: Right Pain - part of body: Knee    Time: 1715-1730 PT Time Calculation (min) (ACUTE ONLY): 15 min   Charges:   PT Evaluation $PT Eval Moderate Complexity: 1 Mod        Ina HomesJaclyn Kaity Pitstick, PT, DPT Acute Rehabilitation Services  Pager [phone] Office [phone]  Malachy ChamberJaclyn L  Lydiana Milley 07/17/2018, 6:09 PM

## 2018-07-18 ENCOUNTER — Telehealth: Payer: Self-pay | Admitting: Rheumatology

## 2018-07-18 ENCOUNTER — Encounter (HOSPITAL_COMMUNITY): Payer: Self-pay | Admitting: Orthopaedic Surgery

## 2018-07-18 LAB — BASIC METABOLIC PANEL
Anion gap: 10 (ref 5–15)
BUN: 18 mg/dL (ref 8–23)
CO2: 24 mmol/L (ref 22–32)
Calcium: 8.3 mg/dL — ABNORMAL LOW (ref 8.9–10.3)
Chloride: 101 mmol/L (ref 98–111)
Creatinine, Ser: 1.19 mg/dL (ref 0.61–1.24)
GFR calc Af Amer: >60 mL/min (ref >60)
GFR calc non Af Amer: >60 mL/min (ref >60)
Glucose, Bld: 146 mg/dL — ABNORMAL HIGH (ref 70–99)
Potassium: 4.5 mmol/L (ref 3.5–5.1)
Sodium: 135 mmol/L (ref 135–145)

## 2018-07-18 LAB — CBC
HCT: 32.1 % — ABNORMAL LOW (ref 39.0–52.0)
Hemoglobin: 11.1 g/dL — ABNORMAL LOW (ref 13.0–17.0)
MCH: 32.2 pg (ref 26.0–34.0)
MCHC: 34.6 g/dL (ref 30.0–36.0)
MCV: 93 fL (ref 80.0–100.0)
Platelets: 154 10*3/uL (ref 150–400)
RBC: 3.45 MIL/uL — ABNORMAL LOW (ref 4.22–5.81)
RDW: 11.6 % (ref 11.5–15.5)
WBC: 11 10*3/uL — ABNORMAL HIGH (ref 4.0–10.5)
nRBC: 0 % (ref 0.0–0.2)

## 2018-07-18 MED ORDER — ACETAMINOPHEN 325 MG PO TABS
650.0000 mg | ORAL_TABLET | Freq: Four times a day (QID) | ORAL | Status: DC
  Administered 2018-07-18 – 2018-07-19 (×4): 650 mg via ORAL
  Filled 2018-07-18 (×4): qty 2

## 2018-07-18 NOTE — Telephone Encounter (Signed)
A representative from [facility] called requesting prescription refill of Cosentyx for patient.

## 2018-07-18 NOTE — Telephone Encounter (Signed)
Per Ladona Ridgelaylor will reorder once cleared after surgery.

## 2018-07-18 NOTE — Progress Notes (Signed)
PROGRESS NOTE    Liston AlbaJagdishcha Quaranta  XBJ:[id number] DOB: 04-24-1946 DOA: 07/17/2018 PCP: Ignatius SpeckingVyas, Dhruv B, MD   Brief Narrative: Patient is a 72 year old male with past medical history of hypertension who was admitted for total knee replacement of the right side under orthopedic service.  He underwent uncomplicated total knee replacement on 12/17.  Medicine was consulted for management of hypertension.  Currently he is normotensive.  Assessment & Plan:   Principal Problem:   S/P total knee replacement using cement, right Active Problems:   Essential hypertension  Status post right knee replacement: Management as per orthopedics.  Pain well controlled.  Plan for discharge to home tomorrow.  Hypertension: Continue Cozaar.  Blood pressure stable this morning.  No need to add any medications.  Leukocytosis: Most likely reactive.  No need for antibiotics  Thank you for the consultation.  We will sign off.  Please recall as needed        DVT prophylaxis: Aspirin Code Status: Full Family Communication: Family member present at the bedside  Disposition Plan: Home tomorrow as per orthopedics   Consultants: Orthopedics  Procedures: Total knee replacement  Antimicrobials: None  Subjective: Patient seen and examined bedside this morning.  Remains very comfortable.  Hemodynamically stable.  No complaints.  Objective: Vitals:   07/17/18 1613 07/17/18 2015 07/18/18 0015 07/18/18 0515  BP: (!) 133/91 111/77 101/68 109/75  Pulse: 86 84 67 79  Resp: 16 20 18 16   Temp: 97.9 F (36.6 C) 97.8 F (36.6 C) (!) 97.5 F (36.4 C) (!) 97.5 F (36.4 C)  TempSrc: Oral Oral Oral Oral  SpO2: 94% 95% 98% 100%  Weight: 67.1 kg     Height: 5\' 7"  (1.702 m)       Intake/Output Summary (Last 24 hours) at 07/18/2018 1139 Last data filed at 07/18/2018 0915 Gross per 24 hour  Intake 1620 ml  Output 875 ml  Net 745 ml   Filed Weights   07/17/18 0823 07/17/18 1613  Weight: 67.1 kg 67.1 kg     Examination:  General exam: Appears calm and comfortable ,Not in distress,average built HEENT:PERRL,Oral mucosa moist, Ear/Nose normal on gross exam Respiratory system: Bilateral equal air entry, normal vesicular breath sounds, no wheezes or crackles  Cardiovascular system: S1 & S2 heard, RRR. No JVD, murmurs, rubs, gallops or clicks. No pedal edema. Gastrointestinal system: Abdomen is nondistended, soft and nontender. No organomegaly or masses felt. Normal bowel sounds heard. Central nervous system: Alert and oriented. No focal neurological deficits. Extremities: No edema, no clubbing ,no cyanosis, distal peripheral pulses palpable.  Right knee covered with dressing.  TED hose Skin: No rashes, lesions or ulcers,no icterus ,no pallor MSK: Normal muscle bulk,tone ,power Psychiatry: Judgement and insight appear normal. Mood & affect appropriate.     Data Reviewed: I have personally reviewed following labs and imaging studies  CBC: Recent Labs  Lab 07/18/18 0135  WBC 11.0*  HGB 11.1*  HCT 32.1*  MCV 93.0  PLT 154   Basic Metabolic Panel: Recent Labs  Lab 07/18/18 0135  NA 135  K 4.5  CL 101  CO2 24  GLUCOSE 146*  BUN 18  CREATININE 1.19  CALCIUM 8.3*   GFR: Estimated Creatinine Clearance: 51.7 mL/min (by C-G formula based on SCr of 1.19 mg/dL). Liver Function Tests: No results for input(s): AST, ALT, ALKPHOS, BILITOT, PROT, ALBUMIN in the last 168 hours. No results for input(s): LIPASE, AMYLASE in the last 168 hours. No results for input(s): AMMONIA in the last 168  hours. Coagulation Profile: No results for input(s): INR, PROTIME in the last 168 hours. Cardiac Enzymes: No results for input(s): CKTOTAL, CKMB, CKMBINDEX, TROPONINI in the last 168 hours. BNP (last 3 results) No results for input(s): PROBNP in the last 8760 hours. HbA1C: No results for input(s): HGBA1C in the last 72 hours. CBG: No results for input(s): GLUCAP in the last 168 hours. Lipid  Profile: No results for input(s): CHOL, HDL, LDLCALC, TRIG, CHOLHDL, LDLDIRECT in the last 72 hours. Thyroid Function Tests: No results for input(s): TSH, T4TOTAL, FREET4, T3FREE, THYROIDAB in the last 72 hours. Anemia Panel: No results for input(s): VITAMINB12, FOLATE, FERRITIN, TIBC, IRON, RETICCTPCT in the last 72 hours. Sepsis Labs: No results for input(s): PROCALCITON, LATICACIDVEN in the last 168 hours.  No results found for this or any previous visit (from the past 240 hour(s)).       Radiology Studies: Dg Chest 2 View  Result Date: 07/17/2018 CLINICAL DATA:  Preop right total knee.  Hypertension. 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 Heart is borderline in size. No confluent airspace opacities or effusions. No acute bony abnormality. IMPRESSION: No active cardiopulmonary disease. Electronically Signed   By: Charlett Nose M.D.   On: 07/17/2018 09:27        Scheduled Meds: . acetaminophen  650 mg Oral Q6H  . aspirin EC  325 mg Oral Q breakfast  . docusate sodium  100 mg Oral BID  . losartan  25 mg Oral Daily   Continuous Infusions: . acetaminophen 1,000 mg (07/18/18 0841)  . methocarbamol (ROBAXIN) IV       LOS: 1 day    Time spent: 35 mins.More than 50% of that time was spent in counseling and/or coordination of care.      Burnadette Pop, MD [facility] Pager [phone]  If 7PM-7AM, please contact night-coverage [email or web address] Password [facility] 07/18/2018, 11:39 AM

## 2018-07-18 NOTE — Progress Notes (Signed)
Physical Therapy Treatment Patient Details Name: Alan Roman MRN: [id number] DOB: 09-09-1945 Today's Date: 07/18/2018    History of Present Illness Pt is a 72 y.o. male s/p elective R TKA (with cement; 50% PWB) on 07/17/18. PMH includes L TKA (10/2015), HTN.    PT Comments    Patient continues to make progress toward PT goals and tolerated increased activity well. Pt is able to ambulate 240 ft with RW and supervision for safety and maintain 50% weight bearing precaution. Current plan remains appropriate.    Follow Up Recommendations  Follow surgeon's recommendation for DC plan and follow-up therapies;Supervision for mobility/OOB     Equipment Recommendations  None recommended by PT    Recommendations for Other Services       Precautions / Restrictions Precautions Precautions: Fall Restrictions Weight Bearing Restrictions: Yes RLE Weight Bearing: Partial weight bearing RLE Partial Weight Bearing Percentage or Pounds: 50    Mobility  Bed Mobility Overal bed mobility: Independent                Transfers Overall transfer level: Needs assistance Equipment used: Rolling walker (2 wheeled) Transfers: Sit to/from Stand Sit to Stand: Supervision         General transfer comment: supervision for safety  Ambulation/Gait Ambulation/Gait assistance: Supervision Gait Distance (Feet): 240 Feet Assistive device: Rolling walker (2 wheeled) Gait Pattern/deviations: Step-through pattern;Decreased weight shift to right;Decreased stride length     General Gait Details: cues for posture and proximity to [employer]             Wheelchair Mobility    Modified Rankin (Stroke Patients Only)       Balance Overall balance assessment: Needs assistance   Sitting balance-Leahy Scale: Good       Standing balance-Leahy Scale: Poor                              Cognition Arousal/Alertness: Awake/alert Behavior During Therapy: WFL for tasks  assessed/performed Overall Cognitive Status: Within Functional Limits for tasks assessed                                        Exercises Total Joint Exercises Quad Sets: Strengthening;Both;10 reps;Supine Short Arc Quad: Strengthening;Right;10 reps;Supine Heel Slides: Strengthening;Right;10 reps;Supine Hip ABduction/ADduction: Strengthening;Right;10 reps;Supine Straight Leg Raises: Strengthening;Right;10 reps;Supine    General Comments        Pertinent Vitals/Pain Pain Assessment: 0-10 Pain Score: 4  Pain Location: R knee/anterior thigh Pain Descriptors / Indicators: Discomfort;Sore Pain Intervention(s): Limited activity within patient's tolerance;Monitored during session;Repositioned;Ice applied    Home Living                      Prior Function            PT Goals (current goals can now be found in the care plan section) Acute Rehab PT Goals Patient Stated Goal: Return home to be with family over holidays Progress towards PT goals: Progressing toward goals    Frequency    7X/week      PT Plan Current plan remains appropriate    Co-evaluation              AM-PAC PT "6 Clicks" Mobility   Outcome Measure  Help needed turning from your back to your side while in a flat bed without using bedrails?:  None Help needed moving from lying on your back to sitting on the side of a flat bed without using bedrails?: None Help needed moving to and from a bed to a chair (including a wheelchair)?: None Help needed standing up from a chair using your arms (e.g., wheelchair or bedside chair)?: None Help needed to walk in hospital room?: A Little Help needed climbing 3-5 steps with a railing? : A Little 6 Click Score: 22    End of Session Equipment Utilized During Treatment: Gait belt Activity Tolerance: Patient tolerated treatment well Patient left: with call bell/phone within reach;with family/visitor present;in chair Nurse Communication:  Mobility status PT Visit Diagnosis: Other abnormalities of gait and mobility (R26.89);Pain Pain - Right/Left: Right Pain - part of body: Knee     Time: [id number] PT Time Calculation (min) (ACUTE ONLY): 34 min  Charges:  $Gait Training: 8-22 mins $Therapeutic Exercise: 8-22 mins                     Erline LevineKellyn Caera Enwright, PTA Acute Rehabilitation Services Pager: [phone]) [phone] Office: [phone]     Carolynne EdouardKellyn R Gusta Marksberry 07/18/2018, 3:28 PM

## 2018-07-18 NOTE — Progress Notes (Signed)
Physical Therapy Treatment Patient Details Name: Alan Roman MRN: [id number] DOB: 1946-02-20 Today's Date: 07/18/2018    History of Present Illness Pt is a 72 y.o. male s/p elective R TKA (with cement; 50% PWB) on 07/17/18. PMH includes L TKA (10/2015), HTN.    PT Comments    Patient seen for mobiltiy progression. Pt is progressing well toward PT goals and able to maintain PWB throughout. Pt tolerated gait training for 160 ft with RW and supervision for safety. Continue HEP next session.    Follow Up Recommendations  Follow surgeon's recommendation for DC plan and follow-up therapies;Supervision for mobility/OOB     Equipment Recommendations  None recommended by PT    Recommendations for Other Services       Precautions / Restrictions Precautions Precautions: Fall Restrictions Weight Bearing Restrictions: Yes RLE Weight Bearing: Partial weight bearing RLE Partial Weight Bearing Percentage or Pounds: 50    Mobility  Bed Mobility Overal bed mobility: Independent                Transfers Overall transfer level: Needs assistance Equipment used: Rolling walker (2 wheeled) Transfers: Sit to/from Stand Sit to Stand: Supervision         General transfer comment: cues for safe hand placement  Ambulation/Gait Ambulation/Gait assistance: Supervision Gait Distance (Feet): 160 Feet Assistive device: Rolling walker (2 wheeled) Gait Pattern/deviations: Step-through pattern;Decreased stride length;Decreased weight shift to right     General Gait Details: cues for sequencing; steady gait; decreased cadence; able to maintain weight bearing precautions    Stairs             Wheelchair Mobility    Modified Rankin (Stroke Patients Only)       Balance Overall balance assessment: Needs assistance   Sitting balance-Leahy Scale: Good       Standing balance-Leahy Scale: Poor                              Cognition Arousal/Alertness:  Awake/alert Behavior During Therapy: WFL for tasks assessed/performed Overall Cognitive Status: Within Functional Limits for tasks assessed                                        Exercises Total Joint Exercises Long Arc Quad: AROM;Right;10 reps;Strengthening;Seated Knee Flexion: AROM;Right;10 reps;Seated;Other (comment)(10 second holds)    General Comments        Pertinent Vitals/Pain Pain Assessment: 0-10 Pain Score: 4  Pain Location: R knee Pain Descriptors / Indicators: Discomfort;Sore Pain Intervention(s): Limited activity within patient's tolerance;Monitored during session;Premedicated before session;Repositioned    Home Living                      Prior Function            PT Goals (current goals can now be found in the care plan section) Acute Rehab PT Goals Patient Stated Goal: Return home to be with family over holidays Progress towards PT goals: Progressing toward goals    Frequency    7X/week      PT Plan Current plan remains appropriate    Co-evaluation              AM-PAC PT "6 Clicks" Mobility   Outcome Measure  Help needed turning from your back to your side while in a flat bed without using bedrails?: None Help  needed moving from lying on your back to sitting on the side of a flat bed without using bedrails?: None Help needed moving to and from a bed to a chair (including a wheelchair)?: None Help needed standing up from a chair using your arms (e.g., wheelchair or bedside chair)?: None Help needed to walk in hospital room?: A Little Help needed climbing 3-5 steps with a railing? : A Little 6 Click Score: 22    End of Session Equipment Utilized During Treatment: Gait belt Activity Tolerance: Patient tolerated treatment well Patient left: with call bell/phone within reach;with family/visitor present;in chair Nurse Communication: Mobility status PT Visit Diagnosis: Other abnormalities of gait and mobility  (R26.89);Pain Pain - Right/Left: Right Pain - part of body: Knee     Time: [id number] PT Time Calculation (min) (ACUTE ONLY): 23 min  Charges:  $Gait Training: 8-22 mins $Therapeutic Exercise: 8-22 mins                     Erline LevineKellyn Blakley Michna, PTA Acute Rehabilitation Services Pager: [phone](336) [phone] Office: [phone]) [phone]     Carolynne EdouardKellyn R Maveryk Renstrom 07/18/2018, 10:25 AM

## 2018-07-18 NOTE — Progress Notes (Signed)
PATIENT ID: Alan AlbaJagdishcha Barrientez        MRN:  [id number]          DOB/AGE: 1946-05-27 / 72 y.o.    Norlene CampbellPeter Whitfield, MD   Jacqualine CodeBrian Etherine Mackowiak, PA-C [address] [place], [place]  [id number]                             [phone]) [phone]   PROGRESS NOTE  Subjective:  negative for Chest Pain  negative for Shortness of Breath  negative for Nausea/Vomiting   negative for Calf Pain    Tolerating Diet: yes         Patient reports pain as mild.       Objective: Vital signs in last 24 hours:    Patient Vitals for the past 24 hrs:  BP Temp Temp src Pulse Resp SpO2 Height Weight  07/18/18 0515 109/75 (!) 97.5 F (36.4 C) Oral 79 16 100 % - -  07/18/18 0015 101/68 (!) 97.5 F (36.4 C) Oral 67 18 98 % - -  07/17/18 2015 111/77 97.8 F (36.6 C) Oral 84 20 95 % - -  07/17/18 1613 (!) 133/91 97.9 F (36.6 C) Oral 86 16 94 % 5\' 7"  (1.702 m) 67.1 kg  07/17/18 1555 - 98.6 F (37 C) - - - - - -  07/17/18 1520 (!) 145/89 - - 86 17 92 % - -  07/17/18 1421 (!) 149/85 - - 89 11 96 % - -  07/17/18 1420 (!) 149/85 - - - - - - -  07/17/18 1358 (!) 142/94 - - - - - - -  07/17/18 1349 (!) 147/102 - - 88 10 99 % - -  07/17/18 1334 (!) 142/94 - - 92 20 100 % - -  07/17/18 1319 (!) 129/92 - - 90 13 100 % - -  07/17/18 1305 (!) 150/93 - - 90 11 100 % - -  07/17/18 1249 100/65 - - 90 15 99 % - -  07/17/18 1234 132/85 - - 88 10 100 % - -  07/17/18 1219 (!) 141/94 - - 98 20 91 % - -  07/17/18 1204 (!) 131/104 98.1 F (36.7 C) - (!) 107 (!) 24 97 % - -  07/17/18 0950 126/78 - - 72 20 99 % - -  07/17/18 0945 120/74 - - 66 20 96 % - -  07/17/18 0940 (!) 145/80 - - 69 20 100 % - -  07/17/18 0823 138/85 97.6 F (36.4 C) Oral 76 18 99 % 5' 6.5" (1.689 m) 67.1 kg      Intake/Output from previous day:   12/17 0701 - 12/18 0700 In: 2380 [P.O.:680; I.V.:1300] Out: 475 [Urine:425]   Intake/Output this shift:   No intake/output data recorded.   Intake/Output      12/17 0701 - 12/18 0700 12/18 0701 - 12/19 0700     P.O. 680    I.V. (mL/kg) 1300 (19.4)    IV Piggyback 400    Total Intake(mL/kg) 2380 (35.5)    Urine (mL/kg/hr) 425    Blood 50    Total Output 475    Net +1905         Urine Occurrence 2 x       LABORATORY DATA: Recent Labs    07/18/18 0135  WBC 11.0*  HGB 11.1*  HCT 32.1*  PLT 154   Recent Labs    07/18/18 0135  NA 135  K 4.5  CL 101  CO2 24  BUN 18  CREATININE 1.19  GLUCOSE 146*  CALCIUM 8.3*   Lab Results  Component Value Date   INR 1.01 07/04/2018    Recent Radiographic Studies :  Dg Chest 2 View  Result Date: 07/17/2018 CLINICAL DATA:  Preop right total knee.  Hypertension. 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 Heart is borderline in size. No confluent airspace opacities or effusions. No acute bony abnormality. IMPRESSION: No active cardiopulmonary disease. Electronically Signed   By: Charlett Nose M.D.   On: 07/17/2018 09:27     Examination:  General appearance: alert and mild distress Resp: clear to auscultation bilaterally Cardio: regular rate and rhythm GI: normal findings: bowel sounds normal  Wound Exam: clean, dry, intact   Drainage:  None: wound tissue dry  Motor Exam: EHL, FHL, Anterior Tibial and Posterior Tibial Intact  Sensory Exam: Superficial Peroneal, Deep Peroneal and Tibial normal  Vascular Exam: Right posterior tibial artery has 2+ (normal) pulse  Assessment:    1 Day Post-Op  Procedure(s) (LRB): RIGHT TOTAL KNEE ARTHROPLASTY (Right)  ADDITIONAL DIAGNOSIS:  Principal Problem:   S/P total knee replacement using cement, right Active Problems:   Essential hypertension     Plan: Physical Therapy as ordered Partial Weight Bearing @ 50% (PWB)  DVT Prophylaxis:  Aspirin, Foot Pumps and TED hose  DISCHARGE PLAN: Home  DISCHARGE NEEDS: CPM, Walker and 3-in-1 comode seat  Anticipated LOS equal to or greater than 2 midnights due to - Age 65 and  older with one or more of the following:  - Obesity  - Expected need for hospital services (PT, OT, Nursing) required for safe  discharge  - Anticipated need for postoperative skilled nursing care or inpatient rehab  - Active co-morbidities: None OR   - Unanticipated findings during/Post Surgery: None  - Patient is a high risk of re-admission due to: None        Jacqualine Code, PA-C [facility]  07/18/2018 7:42 AM  Patient ID: Alan Roman, male   DOB: February 16, 1946, 72 y.o.   MRN: [id number]

## 2018-07-19 LAB — BASIC METABOLIC PANEL
Anion gap: 4 — ABNORMAL LOW (ref 5–15)
BUN: 18 mg/dL (ref 8–23)
CO2: 28 mmol/L (ref 22–32)
Calcium: 7.7 mg/dL — ABNORMAL LOW (ref 8.9–10.3)
Chloride: 106 mmol/L (ref 98–111)
Creatinine, Ser: 1.13 mg/dL (ref 0.61–1.24)
GFR calc Af Amer: >60 mL/min (ref >60)
GFR calc non Af Amer: >60 mL/min (ref >60)
Glucose, Bld: 117 mg/dL — ABNORMAL HIGH (ref 70–99)
Potassium: 4.5 mmol/L (ref 3.5–5.1)
Sodium: 138 mmol/L (ref 135–145)

## 2018-07-19 LAB — CBC
HCT: 27.8 % — ABNORMAL LOW (ref 39.0–52.0)
Hemoglobin: 9.4 g/dL — ABNORMAL LOW (ref 13.0–17.0)
MCH: 31.5 pg (ref 26.0–34.0)
MCHC: 33.8 g/dL (ref 30.0–36.0)
MCV: 93.3 fL (ref 80.0–100.0)
Platelets: 138 10*3/uL — ABNORMAL LOW (ref 150–400)
RBC: 2.98 MIL/uL — ABNORMAL LOW (ref 4.22–5.81)
RDW: 11.9 % (ref 11.5–15.5)
WBC: 10.5 10*3/uL (ref 4.0–10.5)
nRBC: 0 % (ref 0.0–0.2)

## 2018-07-19 MED ORDER — ACETAMINOPHEN 325 MG PO TABS: 650.0000 mg | ORAL_TABLET | Freq: Four times a day (QID) | ORAL | Status: DC

## 2018-07-19 MED ORDER — METHOCARBAMOL 500 MG PO TABS: 500.0000 mg | ORAL_TABLET | Freq: Three times a day (TID) | ORAL | 0 refills | Status: DC | PRN

## 2018-07-19 MED ORDER — OXYCODONE HCL 5 MG PO TABS: 5.0000 mg | ORAL_TABLET | ORAL | 0 refills | Status: DC | PRN

## 2018-07-19 MED ORDER — ASPIRIN 325 MG PO TBEC: 325.0000 mg | DELAYED_RELEASE_TABLET | Freq: Every day | ORAL | 0 refills | Status: DC

## 2018-07-19 NOTE — Progress Notes (Signed)
Subjective: 2 Days Post-Op Procedure(s) (LRB): RIGHT TOTAL KNEE ARTHROPLASTY (Right) Patient reports pain as mild and moderate.    Objective: Vital signs in last 24 hours: Temp:  [98.2 F (36.8 C)] 98.2 F (36.8 C) (12/19 1406) Pulse Rate:  [70-87] 87 (12/19 1406) Resp:  [16-18] 16 (12/19 1406) BP: (106-129)/(73-85) 106/73 (12/19 1406) SpO2:  [96 %-100 %] 96 % (12/19 1406)  Intake/Output from previous day: 12/18 0701 - 12/19 0700 In: 480 [P.O.:480] Out: 750 [Urine:750] Intake/Output this shift: Total I/O In: 720 [P.O.:720] Out: 150 [Urine:150]  Recent Labs    07/18/18 0135 07/19/18 0231  HGB 11.1* 9.4*   Recent Labs    07/18/18 0135 07/19/18 0231  WBC 11.0* 10.5  RBC 3.45* 2.98*  HCT 32.1* 27.8*  PLT 154 138*   Recent Labs    07/18/18 0135 07/19/18 0231  NA 135 138  K 4.5 4.5  CL 101 106  CO2 24 28  BUN 18 18  CREATININE 1.19 1.13  GLUCOSE 146* 117*  CALCIUM 8.3* 7.7*   No results for input(s): LABPT, INR in the last 72 hours.  Neurovascular intact Sensation intact distally Intact pulses distally Dorsiflexion/Plantar flexion intact Incision: no drainage Compartment soft    Assessment/Plan: 2 Days Post-Op Procedure(s) (LRB): RIGHT TOTAL KNEE ARTHROPLASTY (Right) Up with therapy Discharge home with home health    Jacqualine CodeBrian Hae Ahlers. PA-C 07/19/2018, 3:15 PM

## 2018-07-19 NOTE — Progress Notes (Signed)
Physical Therapy Treatment Patient Details Name: Alan Roman MRN: [id number] DOB: 30-Sep-1945 Today's Date: 07/19/2018    History of Present Illness Pt is a 72 y.o. male s/p elective R TKA (with cement; 50% PWB) on 07/17/18. PMH includes L TKA (10/2015), HTN.    PT Comments    Patient seen for mobility progression. Pt reports increased R knee/thigh pain but is tolerating gait training well. Pt is able to ambulate 240 ft with RW and supervision for safety this am. Pt's daughter present. Current plan remains appropriate.    Follow Up Recommendations  Follow surgeon's recommendation for DC plan and follow-up therapies;Supervision for mobility/OOB     Equipment Recommendations  None recommended by PT    Recommendations for Other Services       Precautions / Restrictions Precautions Precautions: Fall Restrictions Weight Bearing Restrictions: Yes RLE Weight Bearing: Partial weight bearing RLE Partial Weight Bearing Percentage or Pounds: 50    Mobility  Bed Mobility Overal bed mobility: Independent                Transfers Overall transfer level: Needs assistance Equipment used: Rolling walker (2 wheeled) Transfers: Sit to/from Stand Sit to Stand: Supervision         General transfer comment: supervision for safety  Ambulation/Gait Ambulation/Gait assistance: Supervision Gait Distance (Feet): 240 Feet Assistive device: Rolling walker (2 wheeled) Gait Pattern/deviations: Step-through pattern;Decreased weight shift to right;Decreased stride length     General Gait Details: cues for safe proximity to RW; steady gait; decreased cadence   Stairs             Wheelchair Mobility    Modified Rankin (Stroke Patients Only)       Balance Overall balance assessment: Needs assistance   Sitting balance-Leahy Scale: Good       Standing balance-Leahy Scale: Poor                              Cognition Arousal/Alertness:  Awake/alert Behavior During Therapy: WFL for tasks assessed/performed Overall Cognitive Status: Within Functional Limits for tasks assessed                                        Exercises      General Comments General comments (skin integrity, edema, etc.): daughter present; pt and daughter educated on car transfer       Pertinent Vitals/Pain Pain Assessment: 0-10 Pain Score: 6  Pain Location: R knee/anterior thigh Pain Descriptors / Indicators: Sore;Guarding;Grimacing;Tightness Pain Intervention(s): Limited activity within patient's tolerance;Monitored during session;Premedicated before session;Repositioned;Ice applied    Home Living                      Prior Function            PT Goals (current goals can now be found in the care plan section) Acute Rehab PT Goals Patient Stated Goal: Return home to be with family over holidays Progress towards PT goals: Progressing toward goals    Frequency    7X/week      PT Plan Current plan remains appropriate    Co-evaluation              AM-PAC PT "6 Clicks" Mobility   Outcome Measure  Help needed turning from your back to your side while in a flat bed without using bedrails?: None  Help needed moving from lying on your back to sitting on the side of a flat bed without using bedrails?: None Help needed moving to and from a bed to a chair (including a wheelchair)?: None Help needed standing up from a chair using your arms (e.g., wheelchair or bedside chair)?: None Help needed to walk in hospital room?: A Little Help needed climbing 3-5 steps with a railing? : A Little 6 Click Score: 22    End of Session Equipment Utilized During Treatment: Gait belt Activity Tolerance: Patient tolerated treatment well Patient left: with call bell/phone within reach;with family/visitor present;in chair Nurse Communication: Mobility status PT Visit Diagnosis: Other abnormalities of gait and mobility  (R26.89);Pain Pain - Right/Left: Right Pain - part of body: Knee     Time: [id number] PT Time Calculation (min) (ACUTE ONLY): 34 min  Charges:  $Gait Training: 23-37 mins                     Erline LevineKellyn Estiben Roman, PTA Acute Rehabilitation Services Pager: [phone](336) [phone] Office: [phone]) [phone]     Alan EdouardKellyn R Ladon Roman 07/19/2018, 11:01 AM

## 2018-07-19 NOTE — Discharge Summary (Signed)
Norlene CampbellPeter Whitfield, MD   Jacqualine CodeBrian Birdie Fetty, PA-C [address], [place], [place]  [id number]                             [phone]  PATIENT ID: Liston AlbaJagdishcha Cadmus        MRN:  [id number]          DOB/AGE: 1946-07-21 / 72 y.o.    DISCHARGE SUMMARY  ADMISSION DATE:    07/17/2018 DISCHARGE DATE:   07/19/2018   ADMISSION DIAGNOSIS: RIGHT KNEE OSTEOARTHRITIS    DISCHARGE DIAGNOSIS:  RIGHT KNEE OSTEOARTHRITIS    ADDITIONAL DIAGNOSIS: Principal Problem:   S/P total knee replacement using cement, right Active Problems:   Essential hypertension  Past Medical History:  Diagnosis Date  . Arthritis    "knees" (11/03/2015)  . History of hiatal hernia 2007; 2009  . Hypertension     PROCEDURE: Procedure(s): RIGHT TOTAL KNEE ARTHROPLASTY  on 07/17/2018  CONSULTS: none    HISTORY: Jagdishcha Flannagan, 72 y.o. male, has a history of pain and functional disability in the right knee due to arthritis and has failed non-surgical conservative treatments for greater than 12 weeks to includeNSAID's and/or analgesics, corticosteriod injections, viscosupplementation injections, flexibility and strengthening excercises and activity modification.  Onset of symptoms was gradual, starting >10 years ago with gradually worsening course since that time. The patient noted no past surgery on the right knee(s).  Patient currently rates pain in the right knee(s) at 8 out of 10 with activity. Patient has night pain, worsening of pain with activity and weight bearing, pain that interferes with activities of daily living, pain with passive range of motion, crepitus and joint swelling.  Patient has evidence of subchondral cysts, subchondral sclerosis, periarticular osteophytes and joint space narrowing by imaging studies. There is no active infection.  HOSPITAL COURSE:  Liston AlbaJagdishcha Ortmann is a 72 y.o. admitted on 07/17/2018 and found to have a diagnosis of RIGHT KNEE OSTEOARTHRITIS.  After appropriate laboratory studies were  obtained  they were taken to the operating room on 07/17/2018 and underwent  Procedure(s): RIGHT TOTAL KNEE ARTHROPLASTY  .   They were given perioperative antibiotics:  Anti-infectives (From admission, onward)   Start     Dose/Rate Route Frequency Ordered Stop   07/17/18 1630  ceFAZolin (ANCEF) IVPB 2g/100 mL premix     2 g 200 mL/hr over 30 Minutes Intravenous Every 6 hours 07/17/18 1558 07/18/18 0330   07/17/18 0904  ceFAZolin (ANCEF) 2-4 GM/100ML-% IVPB    Note to Pharmacy:  Lorenda IshiharaGibbs, Bonnie   : cabinet override      07/17/18 0904 07/17/18 1006   07/17/18 0900  ceFAZolin (ANCEF) IVPB 2g/100 mL premix     2 g 200 mL/hr over 30 Minutes Intravenous On call to O.R. 07/17/18 [id number] 07/17/18 1036    .  Tolerated the procedure well.  Toradol was given post op.  POD #1, allowed out of bed to a chair.  PT for ambulation and exercise program.  Foley D/C'd in morning.  IV saline locked.  O2 discontionued.  POD #2, continued PT and ambulation.   .  The remainder of the hospital course was dedicated to ambulation and strengthening.   The patient was discharged on 2 Days Post-Op in  Stable condition.  Blood products given:none  DIAGNOSTIC STUDIES: Recent vital signs:  Patient Vitals for the past 24 hrs:  BP Temp Temp src Pulse Resp SpO2  07/19/18 0503 129/85 98.2 F (36.8  C) Oral 71 18 100 %  07/18/18 2106 123/75 98.2 F (36.8 C) Oral 70 18 100 %  07/18/18 1416 (!) 100/54 97.6 F (36.4 C) Oral 68 16 100 %       Recent laboratory studies: Recent Labs    07/18/18 0135 07/19/18 0231  WBC 11.0* 10.5  HGB 11.1* 9.4*  HCT 32.1* 27.8*  PLT 154 138*   Recent Labs    07/18/18 0135 07/19/18 0231  NA 135 138  K 4.5 4.5  CL 101 106  CO2 24 28  BUN 18 18  CREATININE 1.19 1.13  GLUCOSE 146* 117*  CALCIUM 8.3* 7.7*   Lab Results  Component Value Date   INR 1.01 07/04/2018     Recent Radiographic Studies :  Dg Chest 2 View  Result Date: 07/17/2018 CLINICAL DATA:  Preop right  total knee.  Hypertension. 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 Heart is borderline in size. No confluent airspace opacities or effusions. No acute bony abnormality. IMPRESSION: No active cardiopulmonary disease. Electronically Signed   By: Charlett Nose M.D.   On: 07/17/2018 09:27    DISCHARGE INSTRUCTIONS: Discharge Instructions    CPM   Complete by:  As directed    Continuous passive motion machine (CPM):      Use the CPM from 0 to 60 for 6-8 hours per day.      You may increase by 5-10 degrees per day.  You may break it up into 2 or 3 sessions per day.      Use CPM for 3-4  weeks or until you are told to stop.   Call MD / Call 911   Complete by:  As directed    If you experience chest pain or shortness of breath, CALL 911 and be transported to the hospital emergency room.  If you develope a fever above 101 F, pus (white drainage) or increased drainage or redness at the wound, or calf pain, call your surgeon's office.   Change dressing   Complete by:  As directed    DO NOT CHANGE YOUR DRESSING   Constipation Prevention   Complete by:  As directed    Drink plenty of fluids.  Prune juice may be helpful.  You may use a stool softener, such as Colace (over the counter) 100 mg twice a day.  Use MiraLax (over the counter) for constipation as needed.   Diet general   Complete by:  As directed    Discharge instructions   Complete by:  As directed    INSTRUCTIONS AFTER JOINT REPLACEMENT   Remove items at home which could result in a fall. This includes throw rugs or furniture in walking pathways ICE to the affected joint every three hours while awake for 30 minutes at a time, for at least the first 3-5 days, and then as needed for pain and swelling.  Continue to use ice for pain and swelling. You may notice swelling that will progress down to the foot and ankle.  This is normal after surgery.  Elevate your leg when you are  not up walking on it.   Continue to use the breathing machine you got in the hospital (incentive spirometer) which will help keep your temperature down.  It is common for your temperature to cycle up and down following surgery, especially at night when you are not up moving around and exerting yourself.  The breathing machine keeps your lungs  expanded and your temperature down.   DIET:  As you were doing prior to hospitalization, we recommend a well-balanced diet.  DRESSING / WOUND CARE / SHOWERING  Keep the surgical dressing until follow up.  The dressing is water proof, so you can shower without any extra covering.  IF THE DRESSING FALLS OFF or the wound gets wet inside, change the dressing with sterile gauze.  Please use good hand washing techniques before changing the dressing.  Do not use any lotions or creams on the incision until instructed by your surgeon.    ACTIVITY  Increase activity slowly as tolerated, but follow the weight bearing instructions below.   No driving for 6 weeks or until further direction given by your physician.  You cannot drive while taking narcotics.  No lifting or carrying greater than 10 lbs. until further directed by your surgeon. Avoid periods of inactivity such as sitting longer than an hour when not asleep. This helps prevent blood clots.  You may return to work once you are authorized by your doctor.     WEIGHT BEARING   Partial weight bearing with assist device as directed.  50%   EXERCISES  Results after joint replacement surgery are often greatly improved when you follow the exercise, range of motion and muscle strengthening exercises prescribed by your doctor. Safety measures are also important to protect the joint from further injury. Any time any of these exercises cause you to have increased pain or swelling, decrease what you are doing until you are comfortable again and then slowly increase them. If you have problems or questions, call your  caregiver or physical therapist for advice.   Rehabilitation is important following a joint replacement. After just a few days of immobilization, the muscles of the leg can become weakened and shrink (atrophy).  These exercises are designed to build up the tone and strength of the thigh and leg muscles and to improve motion. Often times heat used for twenty to thirty minutes before working out will loosen up your tissues and help with improving the range of motion but do not use heat for the first two weeks following surgery (sometimes heat can increase post-operative swelling).   These exercises can be done on a training (exercise) mat, on the floor, on a table or on a bed. Use whatever works the best and is most comfortable for you.    Use music or television while you are exercising so that the exercises are a pleasant break in your day. This will make your life better with the exercises acting as a break in your routine that you can look forward to.   Perform all exercises about fifteen times, three times per day or as directed.  You should exercise both the operative leg and the other leg as well.   Exercises include:  Quad Sets - Tighten up the muscle on the front of the thigh (Quad) and hold for 5-10 seconds.   Straight Leg Raises - With your knee straight (if you were given a brace, keep it on), lift the leg to 60 degrees, hold for 3 seconds, and slowly lower the leg.  Perform this exercise against resistance later as your leg gets stronger.  Leg Slides: Lying on your back, slowly slide your foot toward your buttocks, bending your knee up off the floor (only go as far as is comfortable). Then slowly slide your foot back down until your leg is flat on the floor again.  Angel Wings: Lying  on your back spread your legs to the side as far apart as you can without causing discomfort.  Hamstring Strength:  Lying on your back, push your heel against the floor with your leg straight by tightening up the  muscles of your buttocks.  Repeat, but this time bend your knee to a comfortable angle, and push your heel against the floor.  You may put a pillow under the heel to make it more comfortable if necessary.   A rehabilitation program following joint replacement surgery can speed recovery and prevent re-injury in the future due to weakened muscles. Contact your doctor or a physical therapist for more information on knee rehabilitation.    CONSTIPATION  Constipation is defined medically as fewer than three stools per week and severe constipation as less than one stool per week.  Even if you have a regular bowel pattern at home, your normal regimen is likely to be disrupted due to multiple reasons following surgery.  Combination of anesthesia, postoperative narcotics, change in appetite and fluid intake all can affect your bowels.   YOU MUST use at least one of the following options; they are listed in order of increasing strength to get the job done.  They are all available over the counter, and you may need to use some, POSSIBLY even all of these options:    Drink plenty of fluids (prune juice may be helpful) and high fiber foods Colace 100 mg by mouth twice a day  Senokot for constipation as directed and as needed Dulcolax (bisacodyl), take with full glass of water  Miralax (polyethylene glycol) once or twice a day as needed.  If you have tried all these things and are unable to have a bowel movement in the first 3-4 days after surgery call either your surgeon or your primary doctor.    If you experience loose stools or diarrhea, hold the medications until you stool forms back up.  If your symptoms do not get better within 1 week or if they get worse, check with your doctor.  If you experience "the worst abdominal pain ever" or develop nausea or vomiting, please contact the office immediately for further recommendations for treatment.   ITCHING:  If you experience itching with your medications,  try taking only a single pain pill, or even half a pain pill at a time.  You can also use Benadryl over the counter for itching or also to help with sleep.   TED HOSE STOCKINGS:  Use stockings on both legs until for at least 2 weeks or as directed by physician office. They may be removed at night for sleeping.  MEDICATIONS:  See your medication summary on the "After Visit Summary" that nursing will review with you.  You may have some home medications which will be placed on hold until you complete the course of blood thinner medication.  It is important for you to complete the blood thinner medication as prescribed.  PRECAUTIONS:  If you experience chest pain or shortness of breath - call 911 immediately for transfer to the hospital emergency department.   If you develop a fever greater that 101 F, purulent drainage from wound, increased redness or drainage from wound, foul odor from the wound/dressing, or calf pain - CONTACT YOUR SURGEON.  FOLLOW-UP APPOINTMENTS:  If you do not already have a post-op appointment, please call the office for an appointment to be seen by your surgeon.  Guidelines for how soon to be seen are listed in your "After Visit Summary", but are typically between 1-4 weeks after surgery.  OTHER INSTRUCTIONS:   Knee Replacement:  Do not place pillow under knee, focus on keeping the knee straight while resting. CPM instructions: 0-90 degrees, 2 hours in the morning, 2 hours in the afternoon, and 2 hours in the evening. Place foam block, curve side up under heel at all times except when in CPM or when walking.  DO NOT modify, tear, cut, or change the foam block in any way.  MAKE SURE YOU:  Understand these instructions.  Get help right away if you are not doing well or get worse.    Thank you for letting us be a part of your medical care team.  It is a privilege we respect greatly.  We hope these instructions will help you stay  on track for a fast and full recovery!   Do not put a pillow under the knee. Place it under the heel.   Complete by:  As directed    Driving restrictions   Complete by:  As directed    No driving for 6 weeks   Increase activity slowly as tolerated   Complete by:  As directed    Lifting restrictions   Complete by:  As directed    No lifting for 6 weeks   Partial weight bearing   Complete by:  As directed    % Body Weight:  50%   Laterality:  right   Extremity:  Lower   Patient may shower   Complete by:  As directed    You may shower over the brown dressing   TED hose   Complete by:  As directed    Use stockings (TED hose) for 2-3 weeks on right leg.  You may remove them at night for sleeping.      DISCHARGE MEDICATIONS:   Allergies as of 07/19/2018   No Known Allergies     Medication List    STOP taking these medications   AMOXICILLIN PO   COSENTYX SENSOREADY (300 MG) 150 MG/ML Soaj Generic drug:  Secukinumab (300 MG Dose)   ibuprofen 200 MG tablet Commonly known as:  ADVIL,MOTRIN     TAKE these medications   acetaminophen 325 MG tablet Commonly known as:  TYLENOL Take 2 tablets (650 mg total) by mouth every 6 (six) hours.   aspirin 325 MG EC tablet Take 1 tablet (325 mg total) by mouth daily with breakfast. Start taking on:  July 20, 2018   losartan 25 MG tablet Commonly known as:  COZAAR Take 25 mg by mouth daily.   methocarbamol 500 MG tablet Commonly known as:  ROBAXIN Take 1 tablet (500 mg total) by mouth every 8 (eight) hours as needed for muscle spasms.   oxyCODONE 5 MG immediate release tablet Commonly known as:  Oxy IR/ROXICODONE Take 1-2 tablets (5-10 mg total) by mouth every 4 (four) hours as needed for moderate pain (pain score 4-6).   triamcinolone ointment 0.1 % Commonly known as:  KENALOG Apply 1 application topically 2 (two) times daily as needed (itching).            Durable Medical Equipment  (From admission, onward)          Start     Ordered  07/17/18 1559  DME Walker rolling  Once    Question:  Patient needs a walker to treat with the following condition  Answer:  S/P total knee replacement using cement, right   07/17/18 1558   07/17/18 1559  DME 3 n 1  Once     07/17/18 1558   07/17/18 1559  DME Bedside commode  Once    Question:  Patient needs a bedside commode to treat with the following condition  Answer:  S/P TKR (total knee replacement) using cement, right   07/17/18 1558           Discharge Care Instructions  (From admission, onward)         Start     Ordered   07/19/18 0000  Partial weight bearing    Question Answer Comment  % Body Weight 50%   Laterality right   Extremity Lower      07/19/18 1350   07/19/18 0000  Change dressing    Comments:  DO NOT CHANGE YOUR DRESSING   07/19/18 1350          FOLLOW UP VISIT:    DISPOSITION:   Home  CONDITION:  Stable   Arlys JohnBrian D. Aleda Granaetrarca, PA-C [facility] [phone]  07/19/2018 1:50 PM

## 2018-07-19 NOTE — Progress Notes (Signed)
Orthopedic Tech Progress Note Patient Details:  Alan Roman February 27, 1946 [id number]  CPM Right Knee CPM Right Knee: On Right Knee Flexion (Degrees): 90 Right Knee Extension (Degrees): 0 Additional Comments: R knee resting in extension with towel under ankle  Post Interventions Patient Tolerated: Well Instructions Provided: Care of device, Adjustment of device  Saul FordyceJennifer C Natisha Trzcinski 07/19/2018, 10:28 AM

## 2018-07-19 NOTE — Progress Notes (Signed)
Pt daughter asked about pt having OT, did not see order for occupational therapy. Called PA and left a message awaiting call back.

## 2018-07-19 NOTE — Progress Notes (Signed)
RN gave pt and daughter discharge instructions pt and daughter stated understanding. IV has been removed, daughter packed up all pt belongings. Prescriptions escribed to pharmacy

## 2018-07-19 NOTE — Progress Notes (Signed)
Orthopedic Tech Progress Note Patient Details:  Alan AlbaJagdishcha Roman [birth year] [id number]  CPM Right Knee CPM Right Knee: Off Right Knee Flexion (Degrees): 90 Right Knee Extension (Degrees): 0 Additional Comments: R knee resting in extension with towel under ankle  Post Interventions Patient Tolerated: Well Instructions Provided: Care of device, Adjustment of device  [name] 07/19/2018, 10:28 AM

## 2018-07-20 ENCOUNTER — Telehealth: Payer: Self-pay | Admitting: Rheumatology

## 2018-07-20 DIAGNOSIS — I10 Essential (primary) hypertension: Secondary | ICD-10-CM | POA: Diagnosis not present

## 2018-07-20 DIAGNOSIS — Z96653 Presence of artificial knee joint, bilateral: Secondary | ICD-10-CM | POA: Diagnosis not present

## 2018-07-20 DIAGNOSIS — Z8611 Personal history of tuberculosis: Secondary | ICD-10-CM | POA: Diagnosis not present

## 2018-07-20 DIAGNOSIS — Z471 Aftercare following joint replacement surgery: Secondary | ICD-10-CM | POA: Diagnosis not present

## 2018-07-20 NOTE — Telephone Encounter (Signed)
Patient's daughter Ilene QuaKirty left a voicemail requesting a return call to let her know when her dad can restart his medication from Dr. Corliss Skainseveshwar.

## 2018-07-20 NOTE — Telephone Encounter (Signed)
Advised patient's daughter that he will need to be cleared by Dr. Cleophas DunkerWhitfield prior to restarting medication. She verbalized understanding.

## 2018-07-20 NOTE — Telephone Encounter (Signed)
He will need to be cleared by Dr. Cleophas DunkerWhitfield prior to restarting on Cosentyx.

## 2018-07-23 DIAGNOSIS — Z8611 Personal history of tuberculosis: Secondary | ICD-10-CM | POA: Diagnosis not present

## 2018-07-23 DIAGNOSIS — Z96653 Presence of artificial knee joint, bilateral: Secondary | ICD-10-CM | POA: Diagnosis not present

## 2018-07-23 DIAGNOSIS — I10 Essential (primary) hypertension: Secondary | ICD-10-CM | POA: Diagnosis not present

## 2018-07-23 DIAGNOSIS — Z471 Aftercare following joint replacement surgery: Secondary | ICD-10-CM | POA: Diagnosis not present

## 2018-07-24 DIAGNOSIS — Z8611 Personal history of tuberculosis: Secondary | ICD-10-CM | POA: Diagnosis not present

## 2018-07-24 DIAGNOSIS — I10 Essential (primary) hypertension: Secondary | ICD-10-CM | POA: Diagnosis not present

## 2018-07-24 DIAGNOSIS — Z96653 Presence of artificial knee joint, bilateral: Secondary | ICD-10-CM | POA: Diagnosis not present

## 2018-07-24 DIAGNOSIS — Z471 Aftercare following joint replacement surgery: Secondary | ICD-10-CM | POA: Diagnosis not present

## 2018-07-27 DIAGNOSIS — Z8611 Personal history of tuberculosis: Secondary | ICD-10-CM | POA: Diagnosis not present

## 2018-07-27 DIAGNOSIS — Z96653 Presence of artificial knee joint, bilateral: Secondary | ICD-10-CM | POA: Diagnosis not present

## 2018-07-27 DIAGNOSIS — I10 Essential (primary) hypertension: Secondary | ICD-10-CM | POA: Diagnosis not present

## 2018-07-27 DIAGNOSIS — Z471 Aftercare following joint replacement surgery: Secondary | ICD-10-CM | POA: Diagnosis not present

## 2018-07-30 ENCOUNTER — Ambulatory Visit (HOSPITAL_COMMUNITY)
Admission: RE | Admit: 2018-07-30 | Discharge: 2018-07-30 | Disposition: A | Discharge status: Home / Self Care | Payer: Medicare Other | Source: Ambulatory Visit | Attending: Orthopaedic Surgery | Admitting: Orthopaedic Surgery

## 2018-07-30 ENCOUNTER — Ambulatory Visit (INDEPENDENT_AMBULATORY_CARE_PROVIDER_SITE_OTHER): Payer: Medicare Other | Admitting: Orthopaedic Surgery

## 2018-07-30 ENCOUNTER — Encounter (INDEPENDENT_AMBULATORY_CARE_PROVIDER_SITE_OTHER): Payer: Self-pay | Admitting: Orthopaedic Surgery

## 2018-07-30 ENCOUNTER — Ambulatory Visit (INDEPENDENT_AMBULATORY_CARE_PROVIDER_SITE_OTHER): Payer: Medicare Other

## 2018-07-30 ENCOUNTER — Ambulatory Visit (INDEPENDENT_AMBULATORY_CARE_PROVIDER_SITE_OTHER): Payer: Self-pay

## 2018-07-30 VITALS — BP 92/64 | HR 92 | Ht 67.0 in | Wt 147.0 lb

## 2018-07-30 DIAGNOSIS — I10 Essential (primary) hypertension: Secondary | ICD-10-CM | POA: Diagnosis not present

## 2018-07-30 DIAGNOSIS — M159 Polyosteoarthritis, unspecified: Secondary | ICD-10-CM | POA: Diagnosis not present

## 2018-07-30 DIAGNOSIS — G8929 Other chronic pain: Secondary | ICD-10-CM

## 2018-07-30 DIAGNOSIS — Z96651 Presence of right artificial knee joint: Secondary | ICD-10-CM | POA: Insufficient documentation

## 2018-07-30 DIAGNOSIS — M25561 Pain in right knee: Secondary | ICD-10-CM | POA: Diagnosis not present

## 2018-07-30 NOTE — Progress Notes (Signed)
RIGHT TOTAL KNEE ARTHROPLASTY 07-17-18 . Pt stated having pain on calf  and taking pain meds, tylenol, and musclle relaxer. PT comes pt home 3x a week.

## 2018-07-30 NOTE — Progress Notes (Signed)
Office Visit Note   Patient: Alan Roman           Date of Birth: 01-Jan-1946           MRN: [id number] Visit Date: 07/30/2018              Requested by: Alan Roman, Alan Roman, Alan Roman [address] [place], [place] [id number] PCP: Alan Roman, Alan Roman, Alan Roman   Assessment & Plan: Visit Diagnoses:  1. Chronic pain of right knee   2. History of total right knee replacement     Plan: Films reveal good position of his right total knee replacement.  Is having some calf pain.  Think it might be good idea to obtain a Doppler study.  Continue with the aspirin.  Weightbearing as tolerated with a walker.  Return in 2 weeks  Follow-Up Instructions: Return in about 2 weeks (around 08/13/2018).   Orders:  Orders Placed This Encounter  Procedures  . XR KNEE 3 VIEW RIGHT   No orders of the defined types were placed in this encounter.     Procedures: No procedures performed   Clinical Data: No additional findings.   Subjective: Chief Complaint  Patient presents with  . Left Knee - Routine Post Op, Pain  2 weeks status post uncomplicated right total knee replacement.  Family says he is doing well but is having some calf pain.  No related fever or chills  HPI  Review of Systems   Objective: Vital Signs: BP 92/64   Pulse 92   Ht 5\' 7"  (1.702 m)   Wt 147 lb (66.7 kg)   BMI 23.02 kg/m   Physical Exam  Ortho Exam physical therapy relates his range of motion is -3 to 90 degrees.  I thought it will get more flexion today.  Does have a positive effusion but his knee was not particularly warm incision is healing without problem.  Clips removed and Steri-Strips applied.  Does have diffuse calf pain but no distal edema.  Neurologically intact  Specialty Comments:  No specialty comments available.  Imaging: Xr Knee 3 View Right  Result Date: 07/30/2018 Films of the right total knee replacement were obtained in 3 projections.  There is about 4 degrees of valgus.  No ectopic calcification of methacrylate.   Components otherwise nicely aligned.  No acute changes    PMFS History: Patient Active Problem List   Diagnosis Date Noted  . History of total right knee replacement 07/17/2018  . Primary osteoarthritis of right knee 05/31/2018  . High risk medication use 12/27/2016  . History of tuberculosis 11/05/2015  . Orthostatic hypotension 11/05/2015  . Constipation 11/05/2015  . Thrombocytopenia (HCC) 11/05/2015  . Normocytic anemia 11/05/2015  . Psoriatic arthritis (HCC) 11/03/2015  . Essential hypertension 11/03/2015  . Osteoarthritis of left knee 11/03/2015  . Status post total knee replacement, left 11/03/2015  . Transaminitis 02/02/2013  . Extrapulmonary TB (tuberculosis) 02/02/2013  . Pulmonary infiltrate 01/29/2013  . Pleural effusion 01/26/2013  . Acute bronchiolitis due to other infectious organisms 01/26/2013  . Hyponatremia 01/24/2013  . Psoriasis 01/23/2013  . Fever, unknown origin 01/23/2013  . Immunocompromised state (HCC) 01/23/2013   Past Medical History:  Diagnosis Date  . Arthritis    "knees" (11/03/2015)  . History of hiatal hernia 2007; 2009  . Hypertension     History reviewed. No pertinent family history.  Past Surgical History:  Procedure Laterality Date  . CATARACT EXTRACTION W/ INTRAOCULAR LENS  IMPLANT, BILATERAL Bilateral   . HERNIA REPAIR    .  HIATAL HERNIA REPAIR  2007; 2009  . JOINT REPLACEMENT    . TOTAL KNEE ARTHROPLASTY Left 11/03/2015  . TOTAL KNEE ARTHROPLASTY Left 11/03/2015   Procedure: Left Total Knee Arthroplasty;  Surgeon: Valeria BatmanPeter W Iyad Deroo, Alan Roman;  [facility];  Service: Orthopedics;  Laterality: Left;  . TOTAL KNEE ARTHROPLASTY Right 07/17/2018   Procedure: RIGHT TOTAL KNEE ARTHROPLASTY;  Surgeon: Valeria BatmanWhitfield, Braylie Badami W, Alan Roman;  [facility];  Service: Orthopedics;  Laterality: Right;  Marland Kitchen. VIDEO BRONCHOSCOPY Bilateral 01/31/2013   Procedure: VIDEO BRONCHOSCOPY WITHOUT FLUORO;  Surgeon: Merwyn Katosavid Roman Simonds, Alan Roman;  [facility];  Service:  Cardiopulmonary;  Laterality: Bilateral;   Social History   Occupational History  . Not on file  Tobacco Use  . Smoking status: Never Smoker  . Smokeless tobacco: Never Used  Substance and Sexual Activity  . Alcohol use: No  . Drug use: No  . Sexual activity: Not on file     Valeria BatmanPeter W Tita Terhaar, Alan Roman   Note - This record has been created using [employer] software.  Chart creation errors have been sought, but may not always  have been located. Such creation errors do not reflect on  the standard of medical care.

## 2018-07-31 DIAGNOSIS — Z8611 Personal history of tuberculosis: Secondary | ICD-10-CM | POA: Diagnosis not present

## 2018-07-31 DIAGNOSIS — Z471 Aftercare following joint replacement surgery: Secondary | ICD-10-CM | POA: Diagnosis not present

## 2018-07-31 DIAGNOSIS — I10 Essential (primary) hypertension: Secondary | ICD-10-CM | POA: Diagnosis not present

## 2018-07-31 DIAGNOSIS — Z96653 Presence of artificial knee joint, bilateral: Secondary | ICD-10-CM | POA: Diagnosis not present

## 2018-08-02 DIAGNOSIS — Z8611 Personal history of tuberculosis: Secondary | ICD-10-CM | POA: Diagnosis not present

## 2018-08-02 DIAGNOSIS — I10 Essential (primary) hypertension: Secondary | ICD-10-CM | POA: Diagnosis not present

## 2018-08-02 DIAGNOSIS — Z96653 Presence of artificial knee joint, bilateral: Secondary | ICD-10-CM | POA: Diagnosis not present

## 2018-08-02 DIAGNOSIS — Z471 Aftercare following joint replacement surgery: Secondary | ICD-10-CM | POA: Diagnosis not present

## 2018-08-03 DIAGNOSIS — Z8611 Personal history of tuberculosis: Secondary | ICD-10-CM | POA: Diagnosis not present

## 2018-08-03 DIAGNOSIS — I10 Essential (primary) hypertension: Secondary | ICD-10-CM | POA: Diagnosis not present

## 2018-08-03 DIAGNOSIS — Z471 Aftercare following joint replacement surgery: Secondary | ICD-10-CM | POA: Diagnosis not present

## 2018-08-03 DIAGNOSIS — Z96653 Presence of artificial knee joint, bilateral: Secondary | ICD-10-CM | POA: Diagnosis not present

## 2018-08-06 DIAGNOSIS — I10 Essential (primary) hypertension: Secondary | ICD-10-CM | POA: Diagnosis not present

## 2018-08-06 DIAGNOSIS — Z471 Aftercare following joint replacement surgery: Secondary | ICD-10-CM | POA: Diagnosis not present

## 2018-08-06 DIAGNOSIS — Z96653 Presence of artificial knee joint, bilateral: Secondary | ICD-10-CM | POA: Diagnosis not present

## 2018-08-06 DIAGNOSIS — Z8611 Personal history of tuberculosis: Secondary | ICD-10-CM | POA: Diagnosis not present

## 2018-08-09 DIAGNOSIS — Z8611 Personal history of tuberculosis: Secondary | ICD-10-CM | POA: Diagnosis not present

## 2018-08-09 DIAGNOSIS — Z96653 Presence of artificial knee joint, bilateral: Secondary | ICD-10-CM | POA: Diagnosis not present

## 2018-08-09 DIAGNOSIS — Z471 Aftercare following joint replacement surgery: Secondary | ICD-10-CM | POA: Diagnosis not present

## 2018-08-09 DIAGNOSIS — I10 Essential (primary) hypertension: Secondary | ICD-10-CM | POA: Diagnosis not present

## 2018-08-13 ENCOUNTER — Telehealth (INDEPENDENT_AMBULATORY_CARE_PROVIDER_SITE_OTHER): Payer: Self-pay | Admitting: Orthopaedic Surgery

## 2018-08-13 DIAGNOSIS — I10 Essential (primary) hypertension: Secondary | ICD-10-CM | POA: Diagnosis not present

## 2018-08-13 DIAGNOSIS — Z96653 Presence of artificial knee joint, bilateral: Secondary | ICD-10-CM | POA: Diagnosis not present

## 2018-08-13 DIAGNOSIS — Z471 Aftercare following joint replacement surgery: Secondary | ICD-10-CM | POA: Diagnosis not present

## 2018-08-13 DIAGNOSIS — Z8611 Personal history of tuberculosis: Secondary | ICD-10-CM | POA: Diagnosis not present

## 2018-08-13 NOTE — Telephone Encounter (Signed)
Patient's daughter [name] left a voicemail to let Dr. Cleophas Dunker know that her dad's in-home rehab for his knee replacement is almost finished.  [name] states she was told that he will need outpatient physical therapy and is requesting a return call to let her know how to schedule that appointment.

## 2018-08-14 ENCOUNTER — Other Ambulatory Visit: Payer: Self-pay | Admitting: *Deleted

## 2018-08-14 DIAGNOSIS — Z96651 Presence of right artificial knee joint: Secondary | ICD-10-CM

## 2018-08-14 NOTE — Telephone Encounter (Signed)
[facility] PT, 3 x week?

## 2018-08-14 NOTE — Telephone Encounter (Signed)
yes

## 2018-08-14 NOTE — Telephone Encounter (Signed)
PT order placed, I called patient

## 2018-08-16 DIAGNOSIS — I10 Essential (primary) hypertension: Secondary | ICD-10-CM | POA: Diagnosis not present

## 2018-08-16 DIAGNOSIS — Z8611 Personal history of tuberculosis: Secondary | ICD-10-CM | POA: Diagnosis not present

## 2018-08-16 DIAGNOSIS — Z471 Aftercare following joint replacement surgery: Secondary | ICD-10-CM | POA: Diagnosis not present

## 2018-08-16 DIAGNOSIS — Z96653 Presence of artificial knee joint, bilateral: Secondary | ICD-10-CM | POA: Diagnosis not present

## 2018-08-20 ENCOUNTER — Encounter (INDEPENDENT_AMBULATORY_CARE_PROVIDER_SITE_OTHER): Payer: Self-pay | Admitting: Orthopaedic Surgery

## 2018-08-20 ENCOUNTER — Other Ambulatory Visit: Payer: Self-pay | Admitting: *Deleted

## 2018-08-20 ENCOUNTER — Ambulatory Visit (INDEPENDENT_AMBULATORY_CARE_PROVIDER_SITE_OTHER): Payer: Medicare Other | Admitting: Orthopaedic Surgery

## 2018-08-20 VITALS — BP 118/79 | HR 78

## 2018-08-20 DIAGNOSIS — Z96651 Presence of right artificial knee joint: Secondary | ICD-10-CM

## 2018-08-20 NOTE — Progress Notes (Signed)
Office Visit Note   Patient: Alan Roman           Date of Birth: June 30, 1946           MRN: [id number] Visit Date: 08/20/2018              Requested by: Ignatius Specking, MD [address] [place], [place] [address] PCP: Ignatius Specking, MD   Assessment & Plan: Visit Diagnoses:  1. History of total right knee replacement     Plan: 1 month status post primary right total knee replacement doing quite well.  May progress to single-point cane with full weightbearing.  Stop the aspirin.  May restart his Cosentyx.  Office 1 month  Follow-Up Instructions: Return in about 1 month (around 09/20/2018).   Orders:  No orders of the defined types were placed in this encounter.  No orders of the defined types were placed in this encounter.     Procedures: No procedures performed   Clinical Data: No additional findings.   Subjective: Chief Complaint  Patient presents with  . Right Knee - Follow-up, Pain  1 month status post primary right total knee replacement doing well.  Still on the walker with full weightbearing.  Not having any significant pain.  Will occasionally use Tylenol.  Was taking close Entex preoperatively and can restart that.  Continue with physical therapy for strengthening exercises  HPI  Review of Systems   Objective: Vital Signs: BP 118/79 (BP Location: Right Arm, Patient Position: Sitting, Cuff Size: Normal)   Pulse 78   Physical Exam  Ortho Exam right knee was not hot red or swollen.  Very minimal effusion.  Full extension and flex to 115 degrees.  No instability.  Minimal swelling of his ankle.  No calf pain. motor exam intact  Specialty Comments:  No specialty comments available.  Imaging: No results found.   PMFS History: Patient Active Problem List   Diagnosis Date Noted  . History of total right knee replacement 07/17/2018  . Primary osteoarthritis of right knee 05/31/2018  . High risk medication use 12/27/2016  . History of tuberculosis  11/05/2015  . Orthostatic hypotension 11/05/2015  . Constipation 11/05/2015  . Thrombocytopenia (HCC) 11/05/2015  . Normocytic anemia 11/05/2015  . Psoriatic arthritis (HCC) 11/03/2015  . Essential hypertension 11/03/2015  . Osteoarthritis of left knee 11/03/2015  . Status post total knee replacement, left 11/03/2015  . Transaminitis 02/02/2013  . Extrapulmonary TB (tuberculosis) 02/02/2013  . Pulmonary infiltrate 01/29/2013  . Pleural effusion 01/26/2013  . Acute bronchiolitis due to other infectious organisms 01/26/2013  . Hyponatremia 01/24/2013  . Psoriasis 01/23/2013  . Fever, unknown origin 01/23/2013  . Immunocompromised state (HCC) 01/23/2013   Past Medical History:  Diagnosis Date  . Arthritis    "knees" (11/03/2015)  . History of hiatal hernia 2007; 2009  . Hypertension     History reviewed. No pertinent family history.  Past Surgical History:  Procedure Laterality Date  . CATARACT EXTRACTION W/ INTRAOCULAR LENS  IMPLANT, BILATERAL Bilateral   . HERNIA REPAIR    . HIATAL HERNIA REPAIR  2007; 2009  . JOINT REPLACEMENT    . TOTAL KNEE ARTHROPLASTY Left 11/03/2015  . TOTAL KNEE ARTHROPLASTY Left 11/03/2015   Procedure: Left Total Knee Arthroplasty;  Surgeon: Valeria Batman, MD;  Location: [facility] OR;  Service: Orthopedics;  Laterality: Left;  . TOTAL KNEE ARTHROPLASTY Right 07/17/2018   Procedure: RIGHT TOTAL KNEE ARTHROPLASTY;  Surgeon: Valeria Batman, MD;  Location: [facility] OR;  Service: Orthopedics;  Laterality: Right;  Marland Kitchen VIDEO BRONCHOSCOPY Bilateral 01/31/2013   Procedure: VIDEO BRONCHOSCOPY WITHOUT FLUORO;  Surgeon: Merwyn Katos, MD;  Location: [facility] ENDOSCOPY;  Service: Cardiopulmonary;  Laterality: Bilateral;   Social History   Occupational History  . Not on file  Tobacco Use  . Smoking status: Never Smoker  . Smokeless tobacco: Never Used  Substance and Sexual Activity  . Alcohol use: No  . Drug use: No  . Sexual activity: Not on file     Valeria Batman,  MD   Note - This record has been created using [employer].  Chart creation errors have been sought, but may not always  have been located. Such creation errors do not reflect on  the standard of medical care.

## 2018-08-20 NOTE — Telephone Encounter (Signed)
Last visit: 01/22/18  Next Visit: 09/18/18 Labs: 07/19/18 RBC 2.98, Hgb 9.4, Hct 27.8 Platelets 138, Calcium 7.7 Chest X-ray 08/29/17 stable   Okay to refill Cosentyx?

## 2018-08-21 MED ORDER — SECUKINUMAB (300 MG DOSE) 150 MG/ML ~~LOC~~ SOAJ: 300.0000 mg | SUBCUTANEOUS | 0 refills | Status: DC

## 2018-08-21 NOTE — Telephone Encounter (Signed)
Dr. Corliss Skains is ok with refilling Cosentyx.

## 2018-08-22 ENCOUNTER — Ambulatory Visit: Payer: Medicare Other | Attending: Orthopaedic Surgery | Admitting: Physical Therapy

## 2018-08-22 ENCOUNTER — Encounter: Payer: Self-pay | Admitting: Physical Therapy

## 2018-08-22 ENCOUNTER — Other Ambulatory Visit: Payer: Self-pay

## 2018-08-22 DIAGNOSIS — R600 Localized edema: Secondary | ICD-10-CM

## 2018-08-22 DIAGNOSIS — M25661 Stiffness of right knee, not elsewhere classified: Secondary | ICD-10-CM | POA: Insufficient documentation

## 2018-08-22 DIAGNOSIS — M25561 Pain in right knee: Secondary | ICD-10-CM | POA: Diagnosis not present

## 2018-08-22 DIAGNOSIS — M6281 Muscle weakness (generalized): Secondary | ICD-10-CM | POA: Diagnosis not present

## 2018-08-22 DIAGNOSIS — R269 Unspecified abnormalities of gait and mobility: Secondary | ICD-10-CM | POA: Insufficient documentation

## 2018-08-22 NOTE — Therapy (Signed)
[facility] [address] [place], [place], [address] Phone: [phone]   Fax:  [phone]  Physical Therapy Evaluation  Patient Details  Name: Alan AlbaJagdishcha Roman MRN: [id number] Date of Birth: 07-Jun-1946 Referring Provider (PT): Dr Norlene CampbellPeter Whitfield   Encounter Date: 08/22/2018  PT End of Session - 08/22/18 1146    Visit Number  1    Number of Visits  12    Date for PT Re-Evaluation  09/19/18    Authorization Type  pnote at 10th visit    PT Start Time  1146    PT Stop Time  1246    PT Time Calculation (min)  60 min       Past Medical History:  Diagnosis Date  . Arthritis    "knees" (11/03/2015)  . History of hiatal hernia 2007; 2009  . Hypertension     Past Surgical History:  Procedure Laterality Date  . CATARACT EXTRACTION W/ INTRAOCULAR LENS  IMPLANT, BILATERAL Bilateral   . HERNIA REPAIR    . HIATAL HERNIA REPAIR  2007; 2009  . JOINT REPLACEMENT    . TOTAL KNEE ARTHROPLASTY Left 11/03/2015  . TOTAL KNEE ARTHROPLASTY Left 11/03/2015   Procedure: Left Total Knee Arthroplasty;  Surgeon: Valeria BatmanPeter W Whitfield, MD;  [facility];  Service: Orthopedics;  Laterality: Left;  . TOTAL KNEE ARTHROPLASTY Right 07/17/2018   Procedure: RIGHT TOTAL KNEE ARTHROPLASTY;  Surgeon: Valeria BatmanWhitfield, Peter W, MD;  [facility];  Service: Orthopedics;  Laterality: Right;  Marland Kitchen. VIDEO BRONCHOSCOPY Bilateral 01/31/2013   Procedure: VIDEO BRONCHOSCOPY WITHOUT FLUORO;  Surgeon: Merwyn Katosavid B Simonds, MD;  [facility];  Service: Cardiopulmonary;  Laterality: Bilateral;    There were no vitals filed for this visit.   Subjective Assessment - 08/22/18 1150    Subjective  Pt had elective Rt TKA on 07/17/18, had three weeksof HHPT, stopped last week.  STill doing all HEP, walking with cane at home and RW in community.     How long can you walk comfortably?  performing household ambulation only for now.     Patient Stated Goals  walk nicely, walk for  exercise    Currently in Pain?  No/denies   has pain at night time.         [facility] PT Assessment - 08/22/18 0001      Assessment   Medical Diagnosis  Rt TKA    Referring Provider (PT)  Dr Norlene CampbellPeter Whitfield    Onset Date/Surgical Date  07/17/18    Next MD Visit  10/03/2018    Prior Therapy  HHPT      Precautions   Precautions  None      Balance Screen   Has the patient fallen in the past 6 months  No      Home Environment   Living Environment  Private residence    Living Arrangements  Spouse/significant other    Home Access  Stairs to enter    Home Layout  One level      Prior Function   Level of Independence  Independent    Vocation  Retired    Leisure  work with in [employer]	[place]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      Observation/Other Assessments   Focus on Therapeutic Outcomes (FOTO)   55% limited      Functional Tests   Functional tests  Single leg stance      Single Leg Stance   Comments  limited Rt LE      Posture/Postural Control  Posture Comments  some edema and heat in Rt knee      ROM / Strength   AROM / PROM / Strength  AROM;PROM;Strength      AROM   AROM Assessment Site  Knee    Right/Left Knee  Left;Right    Right Knee Extension  -6    Right Knee Flexion  112    Left Knee Extension  0    Left Knee Flexion  128      PROM   PROM Assessment Site  Knee    Right/Left Knee  Right    Right Knee Extension  -2    Right Knee Flexion  120      Strength   Strength Assessment Site  Hip;Knee;Ankle    Right/Left Hip  Right   Lt WNL except abduction 4+/5   Right Hip Flexion  --   5-/5   Right/Left Knee  Right   Lt WNL   Right Knee Flexion  --   5-/5   Right Knee Extension  4+/5   poor contraction of Rt quad    Right/Left Ankle  --   WNL     Flexibility   Soft Tissue Assessment /Muscle Length  yes    Hamstrings  WNL however palpable tightness distally    ITB  tight banding in  Rt ITB      Palpation   Patella mobility  WNL some crepetis     Palpation comment   tightness in muscles of the Rt quad      Transfers   Transfers  --   Indpendent with all transfers     Ambulation/Gait   Ambulation/Gait  Yes    Ambulation/Gait Assistance  6: Modified independent (Device/Increase time)    Ambulation Distance (Feet)  --   observed in the clinic   Assistive device  Straight cane    Gait Pattern  Decreased hip/knee flexion - right;Decreased stance time - right    Ambulation Surface  Level;Indoor                Objective measurements completed on examination: See above findings.      [facility] Adult PT Treatment/Exercise - 08/22/18 0001      Exercises   Exercises  Knee/Hip      Knee/Hip Exercises: Stretches   Active Hamstring Stretch  Right;1 rep;60 seconds    Gastroc Stretch  Right;30 seconds   seated      Knee/Hip Exercises: Aerobic   Stationary Bike  L1x5'      Modalities   Modalities  Vasopneumatic      Vasopneumatic   Number Minutes Vasopneumatic   15 minutes    Vasopnuematic Location   Knee    Vasopneumatic Pressure  Medium    Vasopneumatic Temperature   3*      Manual Therapy   Manual Therapy  Myofascial release    Myofascial Release  to Rt thigh all around using roller stick             PT Education - 08/22/18 1236    Education Details  reviewed current HEP, added standing gastroc stretch    Person(s) Educated  Patient    Methods  Explanation;Demonstration    Comprehension  Verbalized understanding;Returned demonstration              PT Long Term Goals - 08/22/18 1244      PT LONG TERM GOAL #1   Title  I with advanced HEP to include proprioception and a  walking program ( 09/19/2018)     Time  4    Period  Weeks    Status  New    Target Date  09/19/18      PT LONG TERM GOAL #2   Title  improve Rt knee ROM 0 extesion to 125 degrees flexion to assist with daily mobility ( 09/19/2018)     Time  4    Period  Weeks    Status  New    Target Date  09/19/18      PT LONG TERM GOAL #3   Title  increase  strength Rt hip and knee =/> 5-/5 to allow ambulation on even/uneven surfaces without AD ( 09/19/2018)     Time  4    Period  Weeks    Status  New    Target Date  09/19/18      PT LONG TERM GOAL #4   Title  report no more than 1-2/10 pain in the Rt knee at night ( 09/19/2018)     Time  4    Period  Weeks    Status  New    Target Date  09/19/18      PT LONG TERM GOAL #5   Title  improve FOTO =/< 41% limited ( 09/19/2018)     Time  4    Period  Weeks    Status  New    Target Date  09/19/18             Plan - 08/22/18 1239    Clinical Impression Statement  73 yo male presents ~ 5 wks s/p rt TKA, he had his Lt replaced about 2 yrs ago.  He has been working with HHPT and is now referred to OPPT to continue progression and maximize functional potential.  He has slight decreased in Rt knee ROM as compared to Lt, decreased strength and proprioception in Rt LE, some heat and swelling in the rt knee and gait deviations requiring him to use either a RW or [place]. He should do very well with therapy.     History and Personal Factors relevant to plan of care:  Lt TKA a couple yrs ago    Clinical Presentation  Stable    Clinical Decision Making  Low    Rehab Potential  Excellent    PT Frequency  3x / week    PT Duration  4 weeks    PT Treatment/Interventions  Iontophoresis 4mg /ml Dexamethasone;Balance training;Passive range of motion;Neuromuscular re-education;Gait training;Stair training;Moist Heat;Functional mobility training;Ultrasound;Manual techniques;Dry needling;Joint Manipulations;Patient/family education;Vasopneumatic Device;Electrical Stimulation;Therapeutic exercise;Cryotherapy    PT Next Visit Plan  add in proprioception ex, LE strengthening, ROM and modalties for edema PRN    Consulted and Agree with Plan of Care  Patient       Patient will benefit from skilled therapeutic intervention in order to improve the following deficits and impairments:  Difficulty walking, Increased edema,  Decreased strength, Decreased range of motion, Increased muscle spasms, Pain  Visit Diagnosis: Stiffness of right knee, not elsewhere classified - Plan: PT plan of care cert/re-cert  Localized edema - Plan: PT plan of care cert/re-cert  Muscle weakness (generalized) - Plan: PT plan of care cert/re-cert  Gait difficulty - Plan: PT plan of care cert/re-cert  Acute pain of right knee - Plan: PT plan of care cert/re-cert     Problem List Patient Active Problem List   Diagnosis Date Noted  . History of total right knee replacement 07/17/2018  . Primary osteoarthritis  of right knee 05/31/2018  . High risk medication use 12/27/2016  . History of tuberculosis 11/05/2015  . Orthostatic hypotension 11/05/2015  . Constipation 11/05/2015  . Thrombocytopenia (HCC) 11/05/2015  . Normocytic anemia 11/05/2015  . Psoriatic arthritis (HCC) 11/03/2015  . Essential hypertension 11/03/2015  . Osteoarthritis of left knee 11/03/2015  . Status post total knee replacement, left 11/03/2015  . Transaminitis 02/02/2013  . Extrapulmonary TB (tuberculosis) 02/02/2013  . Pulmonary infiltrate 01/29/2013  . Pleural effusion 01/26/2013  . Acute bronchiolitis due to other infectious organisms 01/26/2013  . Hyponatremia 01/24/2013  . Psoriasis 01/23/2013  . Fever, unknown origin 01/23/2013  . Immunocompromised state (HCC) 01/23/2013    Roderic Scarce PT  08/22/2018, 12:49 PM  [facility] [address] [place], [place], [address] Phone: [phone]   Fax:  [phone]  Name: Alan Roman MRN: [id number] Date of Birth: 02/04/1946

## 2018-08-23 ENCOUNTER — Encounter: Payer: Self-pay | Admitting: Physical Therapy

## 2018-08-23 ENCOUNTER — Ambulatory Visit: Payer: Medicare Other | Admitting: Physical Therapy

## 2018-08-23 DIAGNOSIS — M6281 Muscle weakness (generalized): Secondary | ICD-10-CM

## 2018-08-23 DIAGNOSIS — M25661 Stiffness of right knee, not elsewhere classified: Secondary | ICD-10-CM | POA: Diagnosis not present

## 2018-08-23 DIAGNOSIS — R269 Unspecified abnormalities of gait and mobility: Secondary | ICD-10-CM

## 2018-08-23 DIAGNOSIS — M25561 Pain in right knee: Secondary | ICD-10-CM

## 2018-08-23 DIAGNOSIS — R600 Localized edema: Secondary | ICD-10-CM | POA: Diagnosis not present

## 2018-08-23 NOTE — Therapy (Signed)
[facility] [address] [place], [place], [address] Phone: [phone]   Fax:  [phone]  Physical Therapy Treatment  Patient Details  Name: Alan Roman MRN: [id number] Date of Birth: [birth year] Referring Provider (PT): Dr Norlene Campbell   Encounter Date: 08/23/2018  PT End of Session - 08/23/18 1508    Visit Number  2    Number of Visits  12    Date for PT Re-Evaluation  09/19/18    Authorization Type  pnote at 10th visit    PT Start Time  1502    PT Stop Time  1558    PT Time Calculation (min)  56 min    Activity Tolerance  Patient tolerated treatment well    Behavior During Therapy  [facility] for tasks assessed/performed       Past Medical History:  Diagnosis Date  . Arthritis    "knees" (11/03/2015)  . History of hiatal hernia 2007; 2009  . Hypertension     Past Surgical History:  Procedure Laterality Date  . CATARACT EXTRACTION W/ INTRAOCULAR LENS  IMPLANT, BILATERAL Bilateral   . HERNIA REPAIR    . HIATAL HERNIA REPAIR  2007; 2009  . JOINT REPLACEMENT    . TOTAL KNEE ARTHROPLASTY Left 11/03/2015  . TOTAL KNEE ARTHROPLASTY Left 11/03/2015   Procedure: Left Total Knee Arthroplasty;  Surgeon: Valeria Batman, MD;  Location: [facility] OR;  Service: Orthopedics;  Laterality: Left;  . TOTAL KNEE ARTHROPLASTY Right 07/17/2018   Procedure: RIGHT TOTAL KNEE ARTHROPLASTY;  Surgeon: Valeria Batman, MD;  Location: [facility] OR;  Service: Orthopedics;  Laterality: Right;  Marland Kitchen VIDEO BRONCHOSCOPY Bilateral 01/31/2013   Procedure: VIDEO BRONCHOSCOPY WITHOUT FLUORO;  Surgeon: Merwyn Katos, MD;  Location: [facility] ENDOSCOPY;  Service: Cardiopulmonary;  Laterality: Bilateral;    There were no vitals filed for this visit.  Subjective Assessment - 08/23/18 1506    Subjective  Pt doing well.  Has some pain in Rt hip and Rt medial knee mostly at night.      Currently in Pain?  Yes    Pain Score  2     Pain Location  Knee    Pain Orientation   Right    Pain Descriptors / Indicators  Tightness    Pain Type  Surgical pain    Pain Onset  More than a month ago    Pain Frequency  Intermittent    Aggravating Factors   PM     Pain Relieving Factors  rest , ice /Vaso     Effect of Pain on Daily Activities  limits mobility            [facility] Adult PT Treatment/Exercise - 08/23/18 0001      Knee/Hip Exercises: Stretches   Active Hamstring Stretch  Right;3 reps;30 seconds    Hip Flexor Stretch  Right;3 reps;30 seconds    Knee: Self-Stretch to increase Flexion  Right;3 reps;30 seconds      Knee/Hip Exercises: Aerobic   Stationary Bike  L2x5'      Knee/Hip Exercises: Supine   Quad Sets  Strengthening;Right;1 set;10 reps    Bridges  Strengthening;Both;1 set;15 reps    Straight Leg Raises  Strengthening;Right;1 set;10 reps      Vasopneumatic   Number Minutes Vasopneumatic   15 minutes    Vasopnuematic Location   Knee    Vasopneumatic Pressure  Medium    Vasopneumatic Temperature   3*      Manual Therapy   Manual  Therapy  Joint mobilization;Soft tissue mobilization;Passive ROM    Joint Mobilization  Gr I-II gentle extension     Soft tissue mobilization  quads, peripatellar    Myofascial Release  Rt thigh     Passive ROM  into extension               PT Short Term Goals - 08/22/18 1243      PT SHORT TERM GOAL #1   Title  ------      PT SHORT TERM GOAL #2   Title  -------      PT SHORT TERM GOAL #3   Title  -------      PT SHORT TERM GOAL #4   Title  -------        PT Long Term Goals - 08/22/18 1244      PT LONG TERM GOAL #1   Title  I with advanced HEP to include proprioception and a walking program ( 09/19/2018)     Time  4    Period  Weeks    Status  New    Target Date  09/19/18      PT LONG TERM GOAL #2   Title  improve Rt knee ROM 0 extesion to 125 degrees flexion to assist with daily mobility ( 09/19/2018)     Time  4    Period  Weeks    Status  New    Target Date  09/19/18      PT LONG  TERM GOAL #3   Title  increase strength Rt hip and knee =/> 5-/5 to allow ambulation on even/uneven surfaces without AD ( 09/19/2018)     Time  4    Period  Weeks    Status  New    Target Date  09/19/18      PT LONG TERM GOAL #4   Title  report no more than 1-2/10 pain in the Rt knee at night ( 09/19/2018)     Time  4    Period  Weeks    Status  New    Target Date  09/19/18      PT LONG TERM GOAL #5   Title  improve FOTO =/< 41% limited ( 09/19/2018)     Time  4    Period  Weeks    Status  New    Target Date  09/19/18            Plan - 08/23/18 1509    Clinical Impression Statement  Pt doing well, addressed lateral hip and back pain today in sidelying with manual, rolling. Added ITB stretch as well. No pain post.     PT Treatment/Interventions  Iontophoresis 4mg /ml Dexamethasone;Balance training;Passive range of motion;Neuromuscular re-education;Gait training;Stair training;Moist Heat;Functional mobility training;Ultrasound;Manual techniques;Dry needling;Joint Manipulations;Patient/family education;Vasopneumatic Device;Electrical Stimulation;Therapeutic exercise;Cryotherapy    PT Next Visit Plan  add in proprioception ex, LE strengthening, ROM and modalties for edema PRN    PT Home Exercise Plan  level 1 exercises from HHPT , hamstrign, ITB    Consulted and Agree with Plan of Care  Patient       Patient will benefit from skilled therapeutic intervention in order to improve the following deficits and impairments:  Difficulty walking, Increased edema, Decreased strength, Decreased range of motion, Increased muscle spasms, Pain  Visit Diagnosis: Stiffness of right knee, not elsewhere classified  Localized edema  Muscle weakness (generalized)  Gait difficulty  Acute pain of right knee     Problem List Patient Active  Problem List   Diagnosis Date Noted  . History of total right knee replacement 07/17/2018  . Primary osteoarthritis of right knee 05/31/2018  . High  risk medication use 12/27/2016  . History of tuberculosis 11/05/2015  . Orthostatic hypotension 11/05/2015  . Constipation 11/05/2015  . Thrombocytopenia (HCC) 11/05/2015  . Normocytic anemia 11/05/2015  . Psoriatic arthritis (HCC) 11/03/2015  . Essential hypertension 11/03/2015  . Osteoarthritis of left knee 11/03/2015  . Status post total knee replacement, left 11/03/2015  . Transaminitis 02/02/2013  . Extrapulmonary TB (tuberculosis) 02/02/2013  . Pulmonary infiltrate 01/29/2013  . Pleural effusion 01/26/2013  . Acute bronchiolitis due to other infectious organisms 01/26/2013  . Hyponatremia 01/24/2013  . Psoriasis 01/23/2013  . Fever, unknown origin 01/23/2013  . Immunocompromised state (HCC) 01/23/2013    , 08/23/2018, 4:12 PM  [facility] [address] [place], [place], [address] Phone: [phone]   Fax:  [phone]  Name: Alan Roman MRN: [id number] Date of Birth: 17-Oct-1945  Karie MainlandJennifer , PT 08/23/18 4:12 PM Phone: [phone] Fax: [phone]

## 2018-08-29 ENCOUNTER — Encounter: Payer: Self-pay | Admitting: Physical Therapy

## 2018-08-29 ENCOUNTER — Ambulatory Visit: Payer: Medicare Other | Admitting: Physical Therapy

## 2018-08-29 DIAGNOSIS — M6281 Muscle weakness (generalized): Secondary | ICD-10-CM

## 2018-08-29 DIAGNOSIS — M25561 Pain in right knee: Secondary | ICD-10-CM | POA: Diagnosis not present

## 2018-08-29 DIAGNOSIS — R269 Unspecified abnormalities of gait and mobility: Secondary | ICD-10-CM | POA: Diagnosis not present

## 2018-08-29 DIAGNOSIS — R600 Localized edema: Secondary | ICD-10-CM

## 2018-08-29 DIAGNOSIS — M25661 Stiffness of right knee, not elsewhere classified: Secondary | ICD-10-CM | POA: Diagnosis not present

## 2018-08-29 NOTE — Therapy (Signed)
[facility] Outpatient Rehabilitation [facility] [address] [place], [place], [address] Phone: [phone]   Fax:  [phone]  Physical Therapy Treatment  Patient Details  Name: Alan Roman MRN: [id number] Date of Birth: [birth year] Referring Provider (PT): Dr Norlene Campbell   Encounter Date: 08/29/2018  PT End of Session - 08/29/18 1448    Visit Number  3    Number of Visits  12    Date for PT Re-Evaluation  09/19/18    Authorization Type  pnote at 10th visit    PT Start Time  1435    PT Stop Time  1525    PT Time Calculation (min)  50 min    Activity Tolerance  Patient tolerated treatment well    Behavior During Therapy  [facility] for tasks assessed/performed       Past Medical History:  Diagnosis Date  . Arthritis    "knees" (11/03/2015)  . History of hiatal hernia 2007; 2009  . Hypertension     Past Surgical History:  Procedure Laterality Date  . CATARACT EXTRACTION W/ INTRAOCULAR LENS  IMPLANT, BILATERAL Bilateral   . HERNIA REPAIR    . HIATAL HERNIA REPAIR  2007; 2009  . JOINT REPLACEMENT    . TOTAL KNEE ARTHROPLASTY Left 11/03/2015  . TOTAL KNEE ARTHROPLASTY Left 11/03/2015   Procedure: Left Total Knee Arthroplasty;  Surgeon: Valeria Batman, MD;  Location: [facility] OR;  Service: Orthopedics;  Laterality: Left;  . TOTAL KNEE ARTHROPLASTY Right 07/17/2018   Procedure: RIGHT TOTAL KNEE ARTHROPLASTY;  Surgeon: Valeria Batman, MD;  Location: [facility] OR;  Service: Orthopedics;  Laterality: Right;  Marland Kitchen VIDEO BRONCHOSCOPY Bilateral 01/31/2013   Procedure: VIDEO BRONCHOSCOPY WITHOUT FLUORO;  Surgeon: Merwyn Katos, MD;  Location: [facility] ENDOSCOPY;  Service: Cardiopulmonary;  Laterality: Bilateral;    There were no vitals filed for this visit.  Subjective Assessment - 08/29/18 1440    Subjective  No pain in knee.  MIld in Rt prox hip, thigh     Currently in Pain?  No/denies          [facility] Adult PT Treatment/Exercise - 08/29/18 0001      Knee/Hip Exercises:  Stretches   Active Hamstring Stretch  Right;3 reps;30 seconds    Hip Flexor Stretch  Right;2 reps    Hip Flexor Stretch Limitations  PT gentle knee flexion here     ITB Stretch  Right;3 reps;30 seconds      Knee/Hip Exercises: Aerobic   Nustep  UE and LE fast pace (pt selected) L 5   6 min      Knee/Hip Exercises: Standing   Heel Raises  Both;1 set;20 reps    Hip Flexion  Stengthening;Both;1 set;10 reps    Hip Abduction  Stengthening;Both;1 set;20 reps    Hip Extension  Stengthening;Right;1 set;10 reps    Functional Squat  1 set;15 reps    Wall Squat  1 set;10 reps    Other Standing Knee Exercises  wall slide weight shifting arms up on wall Rt leg forward x 10       Knee/Hip Exercises: Seated   Long Arc Quad  Strengthening;Right;1 set;20 reps;Weights    Long Arc Quad Weight  4 lbs.      Knee/Hip Exercises: Supine   Quad Sets  Strengthening;Right;1 set    Bridges with Beacher May  Strengthening;Both;1 set;10 reps    Straight Leg Raises  Strengthening;Right;1 set;10 reps    Straight Leg Raise with External Rotation  Strengthening;Right;1 set;10 reps  Patellar Mobs  gentle       Vasopneumatic   Number Minutes Vasopneumatic   15 minutes    Vasopnuematic Location   Knee    Vasopneumatic Pressure  Medium    Vasopneumatic Temperature   34      Manual Therapy   Passive ROM  into extension        PT Short Term Goals - 08/22/18 1243      PT SHORT TERM GOAL #1   Title  ------      PT SHORT TERM GOAL #2   Title  -------      PT SHORT TERM GOAL #3   Title  -------      PT SHORT TERM GOAL #4   Title  -------        PT Long Term Goals - 08/29/18 1515      PT LONG TERM GOAL #1   Title  I with advanced HEP to include proprioception and a walking program ( 09/19/2018)     Status  On-going      PT LONG TERM GOAL #2   Title  improve Rt knee ROM 0 extesion to 125 degrees flexion to assist with daily mobility ( 09/19/2018)     Status  On-going      PT LONG TERM GOAL #3    Title  increase strength Rt hip and knee =/> 5-/5 to allow ambulation on even/uneven surfaces without AD ( 09/19/2018)     Status  On-going      PT LONG TERM GOAL #4   Title  report no more than 1-2/10 pain in the Rt knee at night ( 09/19/2018)     Status  On-going      PT LONG TERM GOAL #5   Title  improve FOTO =/< 41% limited ( 09/19/2018)     Status  On-going            Plan - 08/29/18 1519    Clinical Impression Statement  Pt limited only by Rt hip tightness, knee AROM steadily improving.  Self limits activity but would be safe to walk 10 min at a time.       PT Treatment/Interventions  Iontophoresis 4mg /ml Dexamethasone;Balance training;Passive range of motion;Neuromuscular re-education;Gait training;Stair training;Moist Heat;Functional mobility training;Ultrasound;Manual techniques;Dry needling;Joint Manipulations;Patient/family education;Vasopneumatic Device;Electrical Stimulation;Therapeutic exercise;Cryotherapy    PT Next Visit Plan  add in proprioception ex, LE strengthening, ROM and modalties for edema PRN, measure ROM     PT Home Exercise Plan  level 1 exercises from HHPT , hamstrign, ITB    Consulted and Agree with Plan of Care  Patient       Patient will benefit from skilled therapeutic intervention in order to improve the following deficits and impairments:  Difficulty walking, Increased edema, Decreased strength, Decreased range of motion, Increased muscle spasms, Pain  Visit Diagnosis: Stiffness of right knee, not elsewhere classified  Localized edema  Muscle weakness (generalized)  Gait difficulty  Acute pain of right knee     Problem List Patient Active Problem List   Diagnosis Date Noted  . History of total right knee replacement 07/17/2018  . Primary osteoarthritis of right knee 05/31/2018  . High risk medication use 12/27/2016  . History of tuberculosis 11/05/2015  . Orthostatic hypotension 11/05/2015  . Constipation 11/05/2015  .  Thrombocytopenia (HCC) 11/05/2015  . Normocytic anemia 11/05/2015  . Psoriatic arthritis (HCC) 11/03/2015  . Essential hypertension 11/03/2015  . Osteoarthritis of left knee 11/03/2015  . Status post total knee  replacement, left 11/03/2015  . Transaminitis 02/02/2013  . Extrapulmonary TB (tuberculosis) 02/02/2013  . Pulmonary infiltrate 01/29/2013  . Pleural effusion 01/26/2013  . Acute bronchiolitis due to other infectious organisms 01/26/2013  . Hyponatremia 01/24/2013  . Psoriasis 01/23/2013  . Fever, unknown origin 01/23/2013  . Immunocompromised state (HCC) 01/23/2013    PAA,JENNIFER 08/29/2018, 3:24 PM  [facility] [address] [place], [place], [address] Phone: [phone]   Fax:  [phone]  Name: Alan Roman MRN: [id number] Date of Birth: May 08, 1946  Karie MainlandJennifer Paa, PT 08/29/18 3:24 PM Phone: [phone] Fax: [phone]

## 2018-08-30 ENCOUNTER — Encounter: Payer: Self-pay | Admitting: Physical Therapy

## 2018-08-30 ENCOUNTER — Ambulatory Visit: Payer: Medicare Other | Admitting: Physical Therapy

## 2018-08-30 DIAGNOSIS — M6281 Muscle weakness (generalized): Secondary | ICD-10-CM

## 2018-08-30 DIAGNOSIS — R269 Unspecified abnormalities of gait and mobility: Secondary | ICD-10-CM

## 2018-08-30 DIAGNOSIS — R600 Localized edema: Secondary | ICD-10-CM | POA: Diagnosis not present

## 2018-08-30 DIAGNOSIS — M25561 Pain in right knee: Secondary | ICD-10-CM | POA: Diagnosis not present

## 2018-08-30 DIAGNOSIS — M25661 Stiffness of right knee, not elsewhere classified: Secondary | ICD-10-CM | POA: Diagnosis not present

## 2018-08-30 NOTE — Therapy (Signed)
[facility] [address] [place], [place], [address] Phone: [phone]   Fax:  [phone]  Physical Therapy Treatment  Patient Details  Name: Alan Roman Luecke MRN: [id number] Date of Birth: 1946-07-22 Referring Provider (PT): Dr Norlene CampbellPeter Whitfield   Encounter Date: 08/30/2018  PT End of Session - 08/30/18 1817    Visit Number  4    Number of Visits  12    Date for PT Re-Evaluation  09/19/18    Authorization Type  pnote at 10th visit    PT Start Time  1504    PT Stop Time  1600    PT Time Calculation (min)  56 min    Activity Tolerance  Patient tolerated treatment well    Behavior During Therapy  [facility] for tasks assessed/performed       Past Medical History:  Diagnosis Date  . Arthritis    "knees" (11/03/2015)  . History of hiatal hernia 2007; 2009  . Hypertension     Past Surgical History:  Procedure Laterality Date  . CATARACT EXTRACTION W/ INTRAOCULAR LENS  IMPLANT, BILATERAL Bilateral   . HERNIA REPAIR    . HIATAL HERNIA REPAIR  2007; 2009  . JOINT REPLACEMENT    . TOTAL KNEE ARTHROPLASTY Left 11/03/2015  . TOTAL KNEE ARTHROPLASTY Left 11/03/2015   Procedure: Left Total Knee Arthroplasty;  Surgeon: Valeria BatmanPeter W Whitfield, MD;  [facility];  Service: Orthopedics;  Laterality: Left;  . TOTAL KNEE ARTHROPLASTY Right 07/17/2018   Procedure: RIGHT TOTAL KNEE ARTHROPLASTY;  Surgeon: Valeria BatmanWhitfield, Peter W, MD;  [facility];  Service: Orthopedics;  Laterality: Right;  Marland Kitchen. VIDEO BRONCHOSCOPY Bilateral 01/31/2013   Procedure: VIDEO BRONCHOSCOPY WITHOUT FLUORO;  Surgeon: Merwyn Katosavid B Simonds, MD;  [facility];  Service: Cardiopulmonary;  Laterality: Bilateral;    There were no vitals filed for this visit.  Subjective Assessment - 08/30/18 1509    Subjective  Knee is swollen .  He is doing the exercises 2-3  times a day.  He uses cane inside and outside house.      Currently in Pain?  Yes    Pain Score  4     Pain Location   Knee    Pain Orientation  Right;Proximal    Pain Descriptors / Indicators  Tightness   pain   Pain Type  Surgical pain    Aggravating Factors   night    Pain Relieving Factors  rest Ice  vaso   tylenol pm     Effect of Pain on Daily Activities  limits sleep,  mobility,  extra time                       [facility] Adult PT Treatment/Exercise - 08/30/18 0001      High Level Balance   High Level Balance Comments  walking  line  5 feet heel to toe  3 X  close SBA  needed hand at times      Knee/Hip Exercises: Aerobic   Nustep  E/LE  L5 5 min      Knee/Hip Exercises: Standing   Heel Raises  10 reps    Heel Raises Limitations  on step  part of heel off step  CGA      SLS  able to wobbly maintain 4 seconds best      Knee/Hip Exercises: Seated   Sit to Sand  5 reps      Knee/Hip Exercises: Supine   Quad Sets  Strengthening    Quad Sets Limitations  needed support under knee    Heel Slides  10 reps    Patellar Mobs  yes,  mobility improving      Vasopneumatic   Number Minutes Vasopneumatic   15 minutes    Vasopnuematic Location   Knee    Vasopneumatic Pressure  Medium    Vasopneumatic Temperature   34      Manual Therapy   Soft tissue mobilization  quads soft tissue and stretch of mass toward medial knee decreased tensiom in quad    Passive ROM  extension stretch with tibia ER rotation    Kinesiotex  Edema;Inhibit Muscle;Facilitate Muscle      Kinesiotix   Edema  medial lateral   2 fans   Inhibit Muscle   leg    Facilitate Muscle   quads ( 2 Y's              PT Education - 08/30/18 1817    Education Details  exercise form    Person(s) Educated  Patient    Methods  Explanation;Demonstration;Tactile cues;Verbal cues    Comprehension  Verbalized understanding;Returned demonstration       PT Short Term Goals - 08/22/18 1243      PT SHORT TERM GOAL #1   Title  ------      PT SHORT TERM GOAL #2   Title  -------      PT SHORT TERM GOAL #3   Title   -------      PT SHORT TERM GOAL #4   Title  -------        PT Long Term Goals - 08/29/18 1515      PT LONG TERM GOAL #1   Title  I with advanced HEP to include proprioception and a walking program ( 09/19/2018)     Status  On-going      PT LONG TERM GOAL #2   Title  improve Rt knee ROM 0 extesion to 125 degrees flexion to assist with daily mobility ( 09/19/2018)     Status  On-going      PT LONG TERM GOAL #3   Title  increase strength Rt hip and knee =/> 5-/5 to allow ambulation on even/uneven surfaces without AD ( 09/19/2018)     Status  On-going      PT LONG TERM GOAL #4   Title  report no more than 1-2/10 pain in the Rt knee at night ( 09/19/2018)     Status  On-going      PT LONG TERM GOAL #5   Title  improve FOTO =/< 41% limited ( 09/19/2018)     Status  On-going            Plan - 08/30/18 1817    Clinical Impression Statement  ROM /pain.  improving with manual and exercise.    Edema addresses with manual ,  taping and vasopneumatic.  4/10 pain at end of session prior to cold.    PT Next Visit Plan  add in proprioception ex, LE strengthening, ROM and modalties for edema PRN, measure ROM assess tape.     PT Home Exercise Plan  level 1 exercises from HHPT , hamstrign, ITB    Consulted and Agree with Plan of Care  Patient       Patient will benefit from skilled therapeutic intervention in order to improve the following deficits and impairments:     Visit Diagnosis: Stiffness of right knee, not elsewhere classified  Localized edema  Muscle weakness (generalized)  Gait difficulty  Acute pain of right knee     Problem List Patient Active Problem List   Diagnosis Date Noted  . History of total right knee replacement 07/17/2018  . Primary osteoarthritis of right knee 05/31/2018  . High risk medication use 12/27/2016  . History of tuberculosis 11/05/2015  . Orthostatic hypotension 11/05/2015  . Constipation 11/05/2015  . Thrombocytopenia (HCC) 11/05/2015   . Normocytic anemia 11/05/2015  . Psoriatic arthritis (HCC) 11/03/2015  . Essential hypertension 11/03/2015  . Osteoarthritis of left knee 11/03/2015  . Status post total knee replacement, left 11/03/2015  . Transaminitis 02/02/2013  . Extrapulmonary TB (tuberculosis) 02/02/2013  . Pulmonary infiltrate 01/29/2013  . Pleural effusion 01/26/2013  . Acute bronchiolitis due to other infectious organisms 01/26/2013  . Hyponatremia 01/24/2013  . Psoriasis 01/23/2013  . Fever, unknown origin 01/23/2013  . Immunocompromised state (HCC) 01/23/2013    HARRIS,KAREN  PTA 08/30/2018, 6:20 PM  [facility] [address] [place], [place], [address] Phone: [phone]   Fax:  [phone]  Name: Alan Roman Pettijohn MRN: [id number] Date of Birth: 1946-04-16

## 2018-08-31 ENCOUNTER — Encounter

## 2018-08-31 ENCOUNTER — Encounter: Payer: Self-pay | Admitting: Physical Therapy

## 2018-08-31 ENCOUNTER — Ambulatory Visit: Payer: Medicare Other | Admitting: Physical Therapy

## 2018-08-31 DIAGNOSIS — R600 Localized edema: Secondary | ICD-10-CM

## 2018-08-31 DIAGNOSIS — M25661 Stiffness of right knee, not elsewhere classified: Secondary | ICD-10-CM

## 2018-08-31 DIAGNOSIS — M6281 Muscle weakness (generalized): Secondary | ICD-10-CM

## 2018-08-31 DIAGNOSIS — R269 Unspecified abnormalities of gait and mobility: Secondary | ICD-10-CM

## 2018-08-31 DIAGNOSIS — M25561 Pain in right knee: Secondary | ICD-10-CM | POA: Diagnosis not present

## 2018-08-31 NOTE — Patient Instructions (Signed)
Hip Flexor Stretch    Lying on back near edge of bed, bend one leg, foot flat. Hang other leg over edge, relaxed, thigh resting entirely on bed for __2-3__ minutes. Repeat ___1-2_ times. Do __1__ sessions per day. Advanced Exercise: Bend knee back keeping thigh in contact with bed.  http://gt2.exer.us/346   Copyright  VHI. All rights reserved.

## 2018-08-31 NOTE — Therapy (Signed)
[facility] [address] [place], [place], [address] Phone: [phone]   Fax:  [phone]  Physical Therapy Treatment  Patient Details  Name: Alan AlbaJagdishcha Borawski MRN: [id number] Date of Birth: 1945/11/28 Referring Provider (PT): Dr Norlene CampbellPeter Whitfield   Encounter Date: 08/31/2018  PT End of Session - 08/31/18 1155    Visit Number  5    Number of Visits  12    Date for PT Re-Evaluation  09/19/18    Authorization Type  pnote at 10th visit    PT Start Time  1100    PT Stop Time  1205    PT Time Calculation (min)  65 min    Activity Tolerance  Patient tolerated treatment well    Behavior During Therapy  [facility] for tasks assessed/performed       Past Medical History:  Diagnosis Date  . Arthritis    "knees" (11/03/2015)  . History of hiatal hernia 2007; 2009  . Hypertension     Past Surgical History:  Procedure Laterality Date  . CATARACT EXTRACTION W/ INTRAOCULAR LENS  IMPLANT, BILATERAL Bilateral   . HERNIA REPAIR    . HIATAL HERNIA REPAIR  2007; 2009  . JOINT REPLACEMENT    . TOTAL KNEE ARTHROPLASTY Left 11/03/2015  . TOTAL KNEE ARTHROPLASTY Left 11/03/2015   Procedure: Left Total Knee Arthroplasty;  Surgeon: Valeria BatmanPeter W Whitfield, MD;  [facility];  Service: Orthopedics;  Laterality: Left;  . TOTAL KNEE ARTHROPLASTY Right 07/17/2018   Procedure: RIGHT TOTAL KNEE ARTHROPLASTY;  Surgeon: Valeria BatmanWhitfield, Peter W, MD;  [facility];  Service: Orthopedics;  Laterality: Right;  Marland Kitchen. VIDEO BRONCHOSCOPY Bilateral 01/31/2013   Procedure: VIDEO BRONCHOSCOPY WITHOUT FLUORO;  Surgeon: Merwyn Katosavid B Simonds, MD;  [facility];  Service: Cardiopulmonary;  Laterality: Bilateral;    There were no vitals filed for this visit.  Subjective Assessment - 08/31/18 1111    Subjective  Pain in Rt hip limiting today.  Likes the tape.  Knee is stiff, forgot cane .     Currently in Pain?  Yes    Pain Score  2     Pain Location  Hip    Pain Orientation   Right         [facility] PT Assessment - 08/31/18 0001      AROM   Right Knee Extension  -8    Right Knee Flexion  112         [facility] Adult PT Treatment/Exercise - 08/31/18 0001      Knee/Hip Exercises: Stretches   Active Hamstring Stretch  Right;3 reps;30 seconds    Office managerQuad Stretch  Right;5 reps    Quad Stretch Limitations  prone , back pain, had to do for the patient     Hip Flexor Stretch  Right;2 reps    Hip Flexor Stretch Limitations  PT gentle knee flexion here     Knee: Self-Stretch to increase Flexion  Right;5 reps    Knee: Self-Stretch Limitations  strap     ITB Stretch  Right;3 reps;30 seconds      Knee/Hip Exercises: Aerobic   Stationary Bike  L2 x 5       Knee/Hip Exercises: Seated   Long Arc Quad  Strengthening;Right;1 set;20 reps;Weights    Long Arc Quad Weight  4 lbs.    Ball Squeeze  x10     Sit to Starbucks CorporationSand  2 sets;15 reps;without UE support   needed foam in chair to be able to do withtout UEs,  ball squ     Knee/Hip Exercises: Prone   Hamstring Curl  1 set;10 reps    Straight Leg Raises  Strengthening;Right;1 set;10 reps      Vasopneumatic   Number Minutes Vasopneumatic   15 minutes    Vasopnuematic Location   Knee    Vasopneumatic Pressure  Medium    Vasopneumatic Temperature   34      Manual Therapy   Soft tissue mobilization  R quads , TFL , ITB    Passive ROM  extension stretch with tibia ER rotation   seated P/Amobs Gr I prior to LAQ, distraction             PT Education - 08/31/18 1155    Education Details  anterior hip stretching     Person(s) Educated  Patient    Methods  Explanation    Comprehension  Verbalized understanding       PT Short Term Goals - 08/22/18 1243      PT SHORT TERM GOAL #1   Title  ------      PT SHORT TERM GOAL #2   Title  -------      PT SHORT TERM GOAL #3   Title  -------      PT SHORT TERM GOAL #4   Title  -------        PT Long Term Goals - 08/29/18 1515      PT LONG TERM GOAL #1   Title  I  with advanced HEP to include proprioception and a walking program ( 09/19/2018)     Status  On-going      PT LONG TERM GOAL #2   Title  improve Rt knee ROM 0 extesion to 125 degrees flexion to assist with daily mobility ( 09/19/2018)     Status  On-going      PT LONG TERM GOAL #3   Title  increase strength Rt hip and knee =/> 5-/5 to allow ambulation on even/uneven surfaces without AD ( 09/19/2018)     Status  On-going      PT LONG TERM GOAL #4   Title  report no more than 1-2/10 pain in the Rt knee at night ( 09/19/2018)     Status  On-going      PT LONG TERM GOAL #5   Title  improve FOTO =/< 41% limited ( 09/19/2018)     Status  On-going            Plan - 08/31/18 1117    Clinical Impression Statement  pt with less pain today in Rt hip.  Prone self stretching aggravated his back.  Shown how to stretch ant hips in supine off the bed/table.  Knee swelling much improve today with tape.     PT Treatment/Interventions  Iontophoresis 4mg /ml Dexamethasone;Balance training;Passive range of motion;Neuromuscular re-education;Gait training;Stair training;Moist Heat;Functional mobility training;Ultrasound;Manual techniques;Dry needling;Joint Manipulations;Patient/family education;Vasopneumatic Device;Electrical Stimulation;Therapeutic exercise;Cryotherapy    PT Next Visit Plan  add in proprioception ex, LE strengthening, ROM and modalties for edema PRN, measure ROM assess tape.     PT Home Exercise Plan  level 1 exercises from HHPT , hamstrign, ITB    Consulted and Agree with Plan of Care  Patient       Patient will benefit from skilled therapeutic intervention in order to improve the following deficits and impairments:  Difficulty walking, Increased edema, Decreased strength, Decreased range of motion, Increased muscle spasms, Pain  Visit Diagnosis: Stiffness of right knee, not elsewhere classified  Localized edema  Muscle weakness (generalized)  Gait difficulty  Acute pain of right  knee     Problem List Patient Active Problem List   Diagnosis Date Noted  . History of total right knee replacement 07/17/2018  . Primary osteoarthritis of right knee 05/31/2018  . High risk medication use 12/27/2016  . History of tuberculosis 11/05/2015  . Orthostatic hypotension 11/05/2015  . Constipation 11/05/2015  . Thrombocytopenia (HCC) 11/05/2015  . Normocytic anemia 11/05/2015  . Psoriatic arthritis (HCC) 11/03/2015  . Essential hypertension 11/03/2015  . Osteoarthritis of left knee 11/03/2015  . Status post total knee replacement, left 11/03/2015  . Transaminitis 02/02/2013  . Extrapulmonary TB (tuberculosis) 02/02/2013  . Pulmonary infiltrate 01/29/2013  . Pleural effusion 01/26/2013  . Acute bronchiolitis due to other infectious organisms 01/26/2013  . Hyponatremia 01/24/2013  . Psoriasis 01/23/2013  . Fever, unknown origin 01/23/2013  . Immunocompromised state (HCC) 01/23/2013    PAA,JENNIFER 08/31/2018, 11:59 AM  [facility] [address] [place], [place], [address] Phone: [phone]   Fax:  [phone]  Name: Shawn Rocks MRN: [id number] Date of Birth: [birth year]  Karie Mainland, PT 08/31/18 11:59 AM Phone: [phone] Fax: [phone]

## 2018-09-03 ENCOUNTER — Ambulatory Visit: Payer: Medicare Other | Attending: Orthopaedic Surgery | Admitting: Physical Therapy

## 2018-09-03 ENCOUNTER — Encounter: Payer: Self-pay | Admitting: Physical Therapy

## 2018-09-03 DIAGNOSIS — R269 Unspecified abnormalities of gait and mobility: Secondary | ICD-10-CM | POA: Insufficient documentation

## 2018-09-03 DIAGNOSIS — M6281 Muscle weakness (generalized): Secondary | ICD-10-CM | POA: Insufficient documentation

## 2018-09-03 DIAGNOSIS — M25561 Pain in right knee: Secondary | ICD-10-CM

## 2018-09-03 DIAGNOSIS — R600 Localized edema: Secondary | ICD-10-CM | POA: Diagnosis not present

## 2018-09-03 DIAGNOSIS — M25661 Stiffness of right knee, not elsewhere classified: Secondary | ICD-10-CM | POA: Insufficient documentation

## 2018-09-03 NOTE — Therapy (Signed)
[facility] [address] [place], [place], [address] Phone: [phone]   Fax:  [phone]  Physical Therapy Treatment  Patient Details  Name: Alan AlbaJagdishcha Braman MRN: [id number] Date of Birth: Mar 09, 1946 Referring Provider (PT): Dr Norlene CampbellPeter Whitfield   Encounter Date: 09/03/2018  PT End of Session - 09/03/18 1059    Visit Number  6    Number of Visits  12    Date for PT Re-Evaluation  09/19/18    Authorization Type  pnote at 10th visit    PT Start Time  1059    PT Stop Time  1200    PT Time Calculation (min)  61 min       Past Medical History:  Diagnosis Date  . Arthritis    "knees" (11/03/2015)  . History of hiatal hernia 2007; 2009  . Hypertension     Past Surgical History:  Procedure Laterality Date  . CATARACT EXTRACTION W/ INTRAOCULAR LENS  IMPLANT, BILATERAL Bilateral   . HERNIA REPAIR    . HIATAL HERNIA REPAIR  2007; 2009  . JOINT REPLACEMENT    . TOTAL KNEE ARTHROPLASTY Left 11/03/2015  . TOTAL KNEE ARTHROPLASTY Left 11/03/2015   Procedure: Left Total Knee Arthroplasty;  Surgeon: Valeria BatmanPeter W Whitfield, MD;  [facility];  Service: Orthopedics;  Laterality: Left;  . TOTAL KNEE ARTHROPLASTY Right 07/17/2018   Procedure: RIGHT TOTAL KNEE ARTHROPLASTY;  Surgeon: Valeria BatmanWhitfield, Peter W, MD;  [facility];  Service: Orthopedics;  Laterality: Right;  Marland Kitchen. VIDEO BRONCHOSCOPY Bilateral 01/31/2013   Procedure: VIDEO BRONCHOSCOPY WITHOUT FLUORO;  Surgeon: Merwyn Katosavid B Simonds, MD;  [facility];  Service: Cardiopulmonary;  Laterality: Bilateral;    There were no vitals filed for this visit.  Subjective Assessment - 09/03/18 1059    Patient Stated Goals  walk nicely, walk for exercise    Currently in Pain?  Yes    Pain Score  4     Pain Location  Leg    Pain Orientation  Right   thigh   Pain Descriptors / Indicators  Tightness    Pain Type  Surgical pain    Pain Onset  More than a month ago    Pain Frequency  Intermittent     Aggravating Factors   night and a lot of walking    Pain Relieving Factors  heat and ice         [facility] PT Assessment - 09/03/18 0001      Assessment   Medical Diagnosis  Rt TKA    Referring Provider (PT)  Dr Norlene CampbellPeter Whitfield    Onset Date/Surgical Date  07/17/18                   [facility] Treatment/Exercise - 09/03/18 0001      Exercises   Exercises  Knee/Hip      Knee/Hip Exercises: Stretches   Active Hamstring Stretch  Right;3 reps;30 seconds   seated on edge of chair   ITB Stretch  Right   cross body resting on therapist shoulder, 3 min    ITB Stretch Limitations  contact relax      Knee/Hip Exercises: Aerobic   Stationary Bike  --    Nustep  LE L7x5'    bike in use     Knee/Hip Exercises: Standing   Lateral Step Up  Right;2 sets;15 reps;Step Height: 6"    Forward Step Up  Right;2 sets;15 reps;Step Height: 6"   on UE asssit/support  Rocker Board  3 minutes   side/side, FWD/BWD and balance   SLS  3x10 Rt LE sit to stand on high mat with some UE assist      Modalities   Modalities  Cryotherapy;Moist Heat      Moist Heat Therapy   Number Minutes Moist Heat  15 Minutes    Moist Heat Location  --   Rt hamstring     Cryotherapy   Number Minutes Cryotherapy  15 Minutes    Cryotherapy Location  Knee   Rt anterior    Type of Cryotherapy  Ice pack      Manual Therapy   Manual Therapy  Soft tissue mobilization;Joint mobilization    Joint Mobilization  3x30 sec bouts of grade III mobs into knee extension - able to get knee to mat at the end    Soft tissue mobilization  STM Rt quad, ITB and hamstring with roller  and hands                PT Short Term Goals - 08/22/18 1243      PT SHORT TERM GOAL #1   Title  ------      PT SHORT TERM GOAL #2   Title  -------      PT SHORT TERM GOAL #3   Title  -------      PT SHORT TERM GOAL #4   Title  -------        PT Long Term Goals - 08/29/18 1515      PT LONG TERM GOAL #1   Title   I with advanced HEP to include proprioception and a walking program ( 09/19/2018)     Status  On-going      PT LONG TERM GOAL #2   Title  improve Rt knee ROM 0 extesion to 125 degrees flexion to assist with daily mobility ( 09/19/2018)     Status  On-going      PT LONG TERM GOAL #3   Title  increase strength Rt hip and knee =/> 5-/5 to allow ambulation on even/uneven surfaces without AD ( 09/19/2018)     Status  On-going      PT LONG TERM GOAL #4   Title  report no more than 1-2/10 pain in the Rt knee at night ( 09/19/2018)     Status  On-going      PT LONG TERM GOAL #5   Title  improve FOTO =/< 41% limited ( 09/19/2018)     Status  On-going            Plan - 09/03/18 1153    Clinical Impression Statement  Pt with a lot of tightness in the Rt thigh, responded well to manual work with increased pliability in the muscles.  He may benefit from some DN if he continues to tighten up.  Did well with 6" step ups and wishes to progress to higher at the next appointment. Discussed pt driving around his neighborhood and if feels good then out in community.  He is off all meds and his ROM and mobility are good for driving.     Rehab Potential  Excellent    PT Frequency  3x / week    PT Duration  4 weeks    PT Treatment/Interventions  Iontophoresis 4mg /ml Dexamethasone;Balance training;Passive range of motion;Neuromuscular re-education;Gait training;Stair training;Moist Heat;Functional mobility training;Ultrasound;Manual techniques;Dry needling;Joint Manipulations;Patient/family education;Vasopneumatic Device;Electrical Stimulation;Therapeutic exercise;Cryotherapy    PT Next Visit Plan  progress step ups to 8", add  in leg press,  consider DN if Rt quad/hamstrings still tight     Consulted and Agree with Plan of Care  Patient       Patient will benefit from skilled therapeutic intervention in order to improve the following deficits and impairments:  Difficulty walking, Increased edema, Decreased  strength, Decreased range of motion, Increased muscle spasms, Pain  Visit Diagnosis: Stiffness of right knee, not elsewhere classified  Localized edema  Muscle weakness (generalized)  Acute pain of right knee  Gait difficulty     Problem List Patient Active Problem List   Diagnosis Date Noted  . History of total right knee replacement 07/17/2018  . Primary osteoarthritis of right knee 05/31/2018  . High risk medication use 12/27/2016  . History of tuberculosis 11/05/2015  . Orthostatic hypotension 11/05/2015  . Constipation 11/05/2015  . Thrombocytopenia (HCC) 11/05/2015  . Normocytic anemia 11/05/2015  . Psoriatic arthritis (HCC) 11/03/2015  . Essential hypertension 11/03/2015  . Osteoarthritis of left knee 11/03/2015  . Status post total knee replacement, left 11/03/2015  . Transaminitis 02/02/2013  . Extrapulmonary TB (tuberculosis) 02/02/2013  . Pulmonary infiltrate 01/29/2013  . Pleural effusion 01/26/2013  . Acute bronchiolitis due to other infectious organisms 01/26/2013  . Hyponatremia 01/24/2013  . Psoriasis 01/23/2013  . Fever, unknown origin 01/23/2013  . Immunocompromised state (HCC) 01/23/2013    Roderic ScarceSusan Maya Arcand PT  09/03/2018, 11:57 AM  [facility] [address] [place], [place], [address] Phone: [phone]   Fax:  [phone]  Name: Alan Roman MRN: [id number] Date of Birth: Dec 14, 1945

## 2018-09-04 NOTE — Progress Notes (Signed)
Office Visit Note  Patient: Alan Roman             Date of Birth: 1945/12/07           MRN: [id number]             PCP: Ignatius SpeckingVyas, Dhruv B, MD Referring: Ignatius SpeckingVyas, Dhruv B, MD Visit Date: 09/18/2018 Occupation: @GUAROCC @  Subjective:  Arthritis in feet    History of Present Illness: Alan Roman is a 73 y.o. male with history of psoriatic arthritis.  He is on Cosentyx 300 mg sq injections every 28 days.  According to patient he went under right total knee replacement on July 17, 2018 by Dr. Cleophas DunkerWhitfield.  He states he was off Cosentyx 1 month prior to the surgery and restarted Syntex on August 25, 2017.  He denies having any flare while he was off the medication.  He is currently doing well.  He has noticed some hammertoes on his left foot.  He has no rash from psoriasis now.  Activities of Daily Living:  Patient reports morning stiffness for 0 minute.   Patient Reports nocturnal pain.  Difficulty dressing/grooming: Reports Difficulty climbing stairs: Reports Difficulty getting out of chair: Reports Difficulty using hands for taps, buttons, cutlery, and/or writing: Denies  Review of Systems  Constitutional: Negative for fatigue and night sweats.  HENT: Negative for mouth sores, mouth dryness and nose dryness.   Eyes: Negative for redness, visual disturbance and dryness.  Respiratory: Negative for cough, hemoptysis, shortness of breath and difficulty breathing.   Cardiovascular: Negative for chest pain, palpitations, hypertension, irregular heartbeat and swelling in legs/feet.  Gastrointestinal: Negative for blood in stool, constipation and diarrhea.  Endocrine: Negative for increased urination.  Genitourinary: Negative for painful urination.  Musculoskeletal: Positive for arthralgias and joint pain. Negative for joint swelling, myalgias, muscle weakness, morning stiffness, muscle tenderness and myalgias.  Skin: Negative for color change, rash, hair loss, nodules/bumps, skin  tightness, ulcers and sensitivity to sunlight.  Allergic/Immunologic: Negative for susceptible to infections.  Neurological: Negative for dizziness, fainting, memory loss, night sweats and weakness.  Hematological: Negative for swollen glands.  Psychiatric/Behavioral: Negative for depressed mood and sleep disturbance. The patient is not nervous/anxious.     PMFS History:  Patient Active Problem List   Diagnosis Date Noted  . History of total right knee replacement 07/17/2018  . Primary osteoarthritis of right knee 05/31/2018  . History of tuberculosis 11/05/2015  . Orthostatic hypotension 11/05/2015  . Constipation 11/05/2015  . Thrombocytopenia (HCC) 11/05/2015  . Normocytic anemia 11/05/2015  . Psoriatic arthritis (HCC) 11/03/2015  . Essential hypertension 11/03/2015  . Osteoarthritis of left knee 11/03/2015  . Status post total knee replacement, left 11/03/2015  . Pulmonary infiltrate 01/29/2013  . Psoriasis 01/23/2013  . Immunocompromised state (HCC) 01/23/2013    Past Medical History:  Diagnosis Date  . Acute bronchiolitis due to other infectious organisms 01/26/2013  . Arthritis    "knees" (11/03/2015)  . Extrapulmonary TB (tuberculosis) 02/02/2013  . High risk medication use 12/27/2016  . History of hiatal hernia 2007; 2009  . Hypertension   . Pleural effusion 01/26/2013    History reviewed. No pertinent family history. Past Surgical History:  Procedure Laterality Date  . CATARACT EXTRACTION W/ INTRAOCULAR LENS  IMPLANT, BILATERAL Bilateral   . HERNIA REPAIR    . HIATAL HERNIA REPAIR  2007; 2009  . JOINT REPLACEMENT    . TOTAL KNEE ARTHROPLASTY Left 11/03/2015  . TOTAL KNEE ARTHROPLASTY Left 11/03/2015   Procedure: Left  Total Knee Arthroplasty;  Surgeon: Valeria Batman, MD;  [facility];  Service: Orthopedics;  Laterality: Left;  . TOTAL KNEE ARTHROPLASTY Right 07/17/2018   Procedure: RIGHT TOTAL KNEE ARTHROPLASTY;  Surgeon: Valeria Batman, MD;  Location: [facility] OR;   Service: Orthopedics;  Laterality: Right;  Marland Kitchen VIDEO BRONCHOSCOPY Bilateral 01/31/2013   Procedure: VIDEO BRONCHOSCOPY WITHOUT FLUORO;  Surgeon: Merwyn Katos, MD;  [facility];  Service: Cardiopulmonary;  Laterality: Bilateral;   Social History   Social History Narrative  . Not on file    There is no immunization history on file for this patient.   Objective: Vital Signs: BP 139/83 (BP Location: Left Arm, Patient Position: Sitting, Cuff Size: Normal)   Pulse 72   Resp 14   Ht 5\' 6"  (1.676 m)   Wt 151 lb 12.8 oz (68.9 kg)   BMI 24.50 kg/m    Physical Exam Vitals signs and nursing note reviewed.  Constitutional:      Appearance: He is well-developed.  HENT:     Head: Normocephalic and atraumatic.  Eyes:     Conjunctiva/sclera: Conjunctivae normal.     Pupils: Pupils are equal, round, and reactive to light.  Neck:     Musculoskeletal: Normal range of motion and neck supple.  Cardiovascular:     Rate and Rhythm: Normal rate and regular rhythm.     Heart sounds: Normal heart sounds.  Pulmonary:     Effort: Pulmonary effort is normal.     Breath sounds: Normal breath sounds.  Abdominal:     General: Bowel sounds are normal.     Palpations: Abdomen is soft.  Lymphadenopathy:     Cervical: No cervical adenopathy.  Skin:    General: Skin is warm and dry.     Capillary Refill: Capillary refill takes less than 2 seconds.  Neurological:     Mental Status: He is alert and oriented to person, place, and time.  Psychiatric:        Behavior: Behavior normal.      Musculoskeletal Exam: C-spine thoracic lumbar spine good range of motion.  Shoulder joints elbow joints wrist joints MCPs PIPs DIPs been good range of motion with no synovitis.  His bilateral knee joints are replaced.  His right knee joint feels warm to touch.  He had hammertoes and bilateral feet.  More prominent in his left foot. CDAI Exam: CDAI Score: 0.2  Patient Global Assessment: 1 (mm); Provider  Global Assessment: 1 (mm) Swollen: 0 ; Tender: 2  Joint Exam      Right  Left  PIP 3 (toe)      Tender  PIP 4 (toe)      Tender     Investigation: No additional findings.  Imaging: No results found.  Recent Labs: Lab Results  Component Value Date   WBC 10.5 07/19/2018   HGB 9.4 (L) 07/19/2018   PLT 138 (L) 07/19/2018   NA 138 07/19/2018   K 4.5 07/19/2018   CL 106 07/19/2018   CO2 28 07/19/2018   GLUCOSE 117 (H) 07/19/2018   BUN 18 07/19/2018   CREATININE 1.13 07/19/2018   BILITOT 1.0 07/04/2018   ALKPHOS 75 07/04/2018   AST 29 07/04/2018   ALT 19 07/04/2018   PROT 7.1 07/04/2018   ALBUMIN 3.8 07/04/2018   CALCIUM 7.7 (L) 07/19/2018   GFRAA >60 07/19/2018    Speciality Comments: No specialty comments available.  Procedures:  No procedures performed Allergies: Patient has no known allergies.  Assessment / Plan:     Visit Diagnoses: Psoriatic arthritis (HCC) -  Prior to starting on Cosentyx he discontinued Otezla and methotrexate.  Patient is clinically doing well.  He has no active psoriasis.  He has no synovitis on examination.  He resumes Cosentyx in January.  Psoriasis-no active psoriasis lesions were seen.  High risk medication use - Cosentyx 300 mg every 28 days. H/o TB and chest x-ray showed no active disease on 07/17/18.  Last CBC hemoglobin was 9.4 on 07/19/2018 and he received a blood transfusion.  Last BMP within normal limits except for low calcium on 07/19/2018.  Last CMP within normal limits on 07/04/2018.  Advised patient to take multivitamin with iron and also take calcium.  He held his December dose of Cosentyx due to surgery.  Status post total knee replacement, right - Dr. Cleophas Dunker. 07/17/2018  Status post total knee replacement, left - Dr. Cleophas Dunker. 2017  Primary osteoarthritis of right knee  Primary osteoarthritis of both feet-he has osteoarthritis in bilateral feet with hammertoes.  Is been having discomfort walking. I referred him to  check for orthotics.  Thrombocytopenia (HCC)-stable.  Essential hypertension-his blood pressure is controlled.  Extrapulmonary TB (tuberculosis)-treated in the past  Normocytic anemia   Orders: No orders of the defined types were placed in this encounter.  No orders of the defined types were placed in this encounter.   .  Follow-Up Instructions: Return in about 5 months (around 02/16/2019) for Psoriatic arthritis.   Pollyann Savoy, MD  Note - This record has been created using Animal nutritionist.  Chart creation errors have been sought, but may not always  have been located. Such creation errors do not reflect on  the standard of medical care.

## 2018-09-05 ENCOUNTER — Ambulatory Visit: Payer: Medicare Other | Admitting: Physical Therapy

## 2018-09-05 ENCOUNTER — Encounter: Payer: Self-pay | Admitting: Cardiology

## 2018-09-05 ENCOUNTER — Ambulatory Visit (INDEPENDENT_AMBULATORY_CARE_PROVIDER_SITE_OTHER): Payer: Medicare Other | Admitting: Cardiology

## 2018-09-05 VITALS — BP 130/87 | HR 85 | Ht 67.0 in | Wt 150.9 lb

## 2018-09-05 DIAGNOSIS — M25661 Stiffness of right knee, not elsewhere classified: Secondary | ICD-10-CM | POA: Diagnosis not present

## 2018-09-05 DIAGNOSIS — I2510 Atherosclerotic heart disease of native coronary artery without angina pectoris: Secondary | ICD-10-CM

## 2018-09-05 DIAGNOSIS — M6281 Muscle weakness (generalized): Secondary | ICD-10-CM | POA: Diagnosis not present

## 2018-09-05 DIAGNOSIS — K219 Gastro-esophageal reflux disease without esophagitis: Secondary | ICD-10-CM

## 2018-09-05 DIAGNOSIS — M25561 Pain in right knee: Secondary | ICD-10-CM | POA: Diagnosis not present

## 2018-09-05 DIAGNOSIS — E785 Hyperlipidemia, unspecified: Secondary | ICD-10-CM | POA: Diagnosis not present

## 2018-09-05 DIAGNOSIS — R600 Localized edema: Secondary | ICD-10-CM | POA: Diagnosis not present

## 2018-09-05 DIAGNOSIS — R269 Unspecified abnormalities of gait and mobility: Secondary | ICD-10-CM | POA: Diagnosis not present

## 2018-09-05 DIAGNOSIS — I10 Essential (primary) hypertension: Secondary | ICD-10-CM | POA: Diagnosis not present

## 2018-09-05 NOTE — Progress Notes (Signed)
Subjective:   @Patient  ID: Alan Roman, male    DOB: [birth year], 73 y.o.   MRN: [id number]  Chief Complaint  Patient presents with  . Follow-up    1 month for HTN   HPI Patient underwent right knee replacement in December 2019 and has recuperated well.  He now presents for follow-up.  He had called our office after his surgery, stating that his blood pressure has been very low.  I had discontinued Cozaar at that time.  Presently doing well, I made him coming to the office to evaluate his hypertension.  His recuperated well from surgery, except for mild right leg edema has no specific complaints today.  He brings me blood pressure recordings which show excellent control of blood pressure without any medication.  Past Medical History:  Diagnosis Date  . Acute bronchiolitis due to other infectious organisms 01/26/2013  . Arthritis    "knees" (11/03/2015)  . Extrapulmonary TB (tuberculosis) 02/02/2013  . High risk medication use 12/27/2016  . History of hiatal hernia 2007; 2009  . Hypertension   . Pleural effusion 01/26/2013    Past Surgical History:  Procedure Laterality Date  . CATARACT EXTRACTION W/ INTRAOCULAR LENS  IMPLANT, BILATERAL Bilateral   . HERNIA REPAIR    . HIATAL HERNIA REPAIR  2007; 2009  . JOINT REPLACEMENT    . TOTAL KNEE ARTHROPLASTY Left 11/03/2015  . TOTAL KNEE ARTHROPLASTY Left 11/03/2015   Procedure: Left Total Knee Arthroplasty;  Surgeon: Valeria Batman, MD;  Location: [facility] OR;  Service: Orthopedics;  Laterality: Left;  . TOTAL KNEE ARTHROPLASTY Right 07/17/2018   Procedure: RIGHT TOTAL KNEE ARTHROPLASTY;  Surgeon: Valeria Batman, MD;  Location: [facility] OR;  Service: Orthopedics;  Laterality: Right;  Marland Kitchen VIDEO BRONCHOSCOPY Bilateral 01/31/2013   Procedure: VIDEO BRONCHOSCOPY WITHOUT FLUORO;  Surgeon: Merwyn Katos, MD;  Location: [facility] ENDOSCOPY;  Service: Cardiopulmonary;  Laterality: Bilateral;    Social History   Socioeconomic History  . Marital status:  Married    Spouse name: Not on file  . Number of children: Not on file  . Years of education: Not on file  . Highest education level: Not on file  Occupational History  . Not on file  Social Needs  . Financial resource strain: Not on file  . Food insecurity:    Worry: Not on file    Inability: Not on file  . Transportation needs:    Medical: Not on file    Non-medical: Not on file  Tobacco Use  . Smoking status: Never Smoker  . Smokeless tobacco: Never Used  Substance and Sexual Activity  . Alcohol use: No  . Drug use: No  . Sexual activity: Not on file  Lifestyle  . Physical activity:    Days per week: Not on file    Minutes per session: Not on file  . Stress: Not on file  Relationships  . Social connections:    Talks on phone: Not on file    Gets together: Not on file    Attends religious service: Not on file    Active member of club or organization: Not on file    Attends meetings of clubs or organizations: Not on file    Relationship status: Not on file  . Intimate partner violence:    Fear of current or ex partner: Not on file    Emotionally abused: Not on file    Physically abused: Not on file    Forced sexual activity: Not  on file  Other Topics Concern  . Not on file  Social History Narrative  . Not on file    Current Outpatient Medications on File Prior to Visit  Medication Sig Dispense Refill  . acetaminophen (TYLENOL) 325 MG tablet Take 2 tablets (650 mg total) by mouth every 6 (six) hours.    Marland Kitchen. aspirin EC 325 MG EC tablet Take 1 tablet (325 mg total) by mouth daily with breakfast. 30 tablet 0  . losartan (COZAAR) 25 MG tablet Take 25 mg by mouth daily.     . methocarbamol (ROBAXIN) 500 MG tablet Take 1 tablet (500 mg total) by mouth every 8 (eight) hours as needed for muscle spasms. 30 tablet 0  . oxyCODONE (OXY IR/ROXICODONE) 5 MG immediate release tablet Take 1-2 tablets (5-10 mg total) by mouth every 4 (four) hours as needed for moderate pain (pain  score 4-6). 80 tablet 0  . Secukinumab, 300 MG Dose, (COSENTYX SENSOREADY, 300 MG,) 150 MG/ML SOAJ Inject 300 mg into the skin every 28 (twenty-eight) days. 6 pen 0  . triamcinolone ointment (KENALOG) 0.1 % Apply 1 application topically 2 (two) times daily as needed (itching).     No current facility-administered medications on file prior to visit.    Review of Systems  Constitutional: Negative for malaise/fatigue and weight loss.  Respiratory: Negative for cough, hemoptysis and shortness of breath.   Cardiovascular: Positive for leg swelling (right leg since TKA). Negative for chest pain, palpitations and claudication.  Gastrointestinal: Negative for abdominal pain, blood in stool, constipation, heartburn and vomiting.  Genitourinary: Negative for dysuria.  Musculoskeletal: Positive for joint pain (Psoriatic arthritis). Negative for myalgias.  Neurological: Negative for dizziness, focal weakness and headaches.  Endo/Heme/Allergies: Does not bruise/bleed easily.  Psychiatric/Behavioral: Negative for depression. The patient is not nervous/anxious.   All other systems reviewed and are negative.      Objective:  There were no vitals taken for this visit.  Physical Exam  Constitutional: He appears well-developed and well-nourished. No distress.  HENT:  Head: Atraumatic.  Eyes: Conjunctivae are normal.  Neck: Neck supple. No JVD present. No thyromegaly present.  Cardiovascular: Normal rate, regular rhythm, normal heart sounds and intact distal pulses. Exam reveals no gallop.  No murmur heard. Pulmonary/Chest: Effort normal. He has rales (bilateral basal fine crackles heard legs slightly more prominent.).  Abdominal: Soft. Bowel sounds are normal.  Musculoskeletal: Normal range of motion.        General: Edema (Right leg below knee 2 plus pitting edema) present.  Neurological: He is alert.  Skin: Skin is warm and dry.  Psychiatric: He has a normal mood and affect.    Echocardiogram  [11/12/2015]:  Left ventricle cavity is small. Normal global wall motion. Normal diastolic filling pattern. Calculated EF 66%. Trace mitral regurgitation. Mild to moderate tricuspid regurgitation. Borderline pulmonary hypertension. Pulmonary artery systolic pressure is estimated at 31 mm Hg. Trace pulmonic regurgitation. compared to the study done on 09/19/11, TR new.  Nuclear stress test [11/16/2015]:  1. The resting electrocardiogram demonstrated normal sinus rhythm and normal resting conduction. Poor R wave progression. Stress EKG is non diagnostic for ischemia as it is a pharmacologic stress using Lexiscan. 2. Myocardial perfusion imaging is normal. The LV appeared to be small. Overall left ventricular systolic function was normal without regional wall motion abnormalities. The left ventricular ejection fraction was mildly reduced at 49%. This is a low risk study.   Assessment & Recommendations:   1. Essential hypertension EKG 02/17/2018: Normal sinus  rhythm at the rate of 69 bpm, leftward axis. No evidence of ischemia, normal EKG. No significant change from EKG 12/07/2016.   The patient is now well controlled and normal and brings in blood pressure recordings which are all well controlled at home and his dizziness has subsided since stopping losartan.hence no medications were given now and will continue to monitor.  2. Coronary artery calcification seen on CAT scan CT angiogram chest 11/10/2015:  Mild peripheral reticular opacities and interstitial prominence likely reflects some chronic lung disease. Prox LAD calcifcation   3. GERD without esophagitis Symptoms are improved since he has not been taking NSAIDs.  4. Mild hyperlipidemia 12/08/2016: Creatinine 1.08, potassium 4.6, CMP normal.  Cholesterol 170, triglycerides 82, HDL 41, LDL 113. Patient presently not on any medications.  He'll be discussing this with his PCP Dr. Doreen Beam soon.  Coronary calcification noted on CTA, would  recommend at least a low dose high-intensity statin, Crestor 5 mg of Lipitor 5-10 mg daily.  With regard to aspirin, he may stay off of aspirin as there is no documented greater than 50% stenosis vasculature. He would like to see me back in a year.  Yates Decamp, MD, [facility] 09/05/2018, 8:06 AM [facility] Pager: [phone] Office: [phone] If no answer Cell [phone]

## 2018-09-05 NOTE — Therapy (Signed)
[facility] [address] [place], [place], [address] Phone: [phone]   Fax:  [phone]  Physical Therapy Treatment  Patient Details  Name: Alan AlbaJagdishcha Roman MRN: [id number] Date of Birth: February 18, 1946 Referring Provider (PT): Dr Norlene CampbellPeter Whitfield   Encounter Date: 09/05/2018  PT End of Session - 09/05/18 1314    Visit Number  7    Number of Visits  12    Date for PT Re-Evaluation  09/19/18    Authorization Type  pnote at 10th visit    PT Start Time  1302    PT Stop Time  1400    PT Time Calculation (min)  58 min    Activity Tolerance  Patient tolerated treatment well    Behavior During Therapy  [facility] for tasks assessed/performed       Past Medical History:  Diagnosis Date  . Acute bronchiolitis due to other infectious organisms 01/26/2013  . Arthritis    "knees" (11/03/2015)  . Extrapulmonary TB (tuberculosis) 02/02/2013  . High risk medication use 12/27/2016  . History of hiatal hernia 2007; 2009  . Hypertension   . Pleural effusion 01/26/2013    Past Surgical History:  Procedure Laterality Date  . CATARACT EXTRACTION W/ INTRAOCULAR LENS  IMPLANT, BILATERAL Bilateral   . HERNIA REPAIR    . HIATAL HERNIA REPAIR  2007; 2009  . JOINT REPLACEMENT    . TOTAL KNEE ARTHROPLASTY Left 11/03/2015  . TOTAL KNEE ARTHROPLASTY Left 11/03/2015   Procedure: Left Total Knee Arthroplasty;  Surgeon: Valeria BatmanPeter W Whitfield, MD;  [facility];  Service: Orthopedics;  Laterality: Left;  . TOTAL KNEE ARTHROPLASTY Right 07/17/2018   Procedure: RIGHT TOTAL KNEE ARTHROPLASTY;  Surgeon: Valeria BatmanWhitfield, Peter W, MD;  [facility];  Service: Orthopedics;  Laterality: Right;  Marland Kitchen. VIDEO BRONCHOSCOPY Bilateral 01/31/2013   Procedure: VIDEO BRONCHOSCOPY WITHOUT FLUORO;  Surgeon: Merwyn Katosavid B Simonds, MD;  [facility];  Service: Cardiopulmonary;  Laterality: Bilateral;    There were no vitals filed for this visit.  Subjective Assessment - 09/05/18 1310    Subjective  Hip is getting better, both knee and hip are 2/10.  I put heat on the knee.  Clicks with riding the bike.  I see the MD next month.          [facility] PT Assessment - 09/05/18 0001      Assessment   Medical Diagnosis           [facility] Adult PT Treatment/Exercise - 09/05/18 0001      Knee/Hip Exercises: Aerobic   Stationary Bike  L2 x 5       Knee/Hip Exercises: Standing   Hip Flexion  Stengthening;Both;1 set;10 reps    Forward Lunges Limitations  x 3, 10 sec hold on foam pad     Hip Abduction  Stengthening;Both;1 set;15 reps    Lateral Step Up  Right;2 sets;15 reps;Step Height: 6"    Forward Step Up  Right;2 sets;15 reps;Step Height: 6"   on UE asssit/support     Knee/Hip Exercises: Supine   Straight Leg Raises  Strengthening;Right;1 set;15 reps    Straight Leg Raises Limitations  1     Straight Leg Raise with External Rotation  Strengthening;Right;1 set;15 reps    Straight Leg Raise with External Rotation Limitations  1 , done in adduction to ease strain on TFL     Knee Extension  AAROM;Strengthening      Vasopneumatic   Number Minutes Vasopneumatic   15 minutes  Vasopnuematic Location   Knee    Vasopneumatic Pressure  Medium    Vasopneumatic Temperature   34      Manual Therapy   Joint Mobilization  Gr II extension     Soft tissue mobilization  STM Rt quad, ITB and hamstring with roller  and hands                PT Short Term Goals - 08/22/18 1243      PT SHORT TERM GOAL #1   Title  ------      PT SHORT TERM GOAL #2   Title  -------      PT SHORT TERM GOAL #3   Title  -------      PT SHORT TERM GOAL #4   Title  -------        PT Long Term Goals - 08/29/18 1515      PT LONG TERM GOAL #1   Title  I with advanced HEP to include proprioception and a walking program ( 09/19/2018)     Status  On-going      PT LONG TERM GOAL #2   Title  improve Rt knee ROM 0 extesion to 125 degrees flexion to assist with daily mobility ( 09/19/2018)      Status  On-going      PT LONG TERM GOAL #3   Title  increase strength Rt hip and knee =/> 5-/5 to allow ambulation on even/uneven surfaces without AD ( 09/19/2018)     Status  On-going      PT LONG TERM GOAL #4   Title  report no more than 1-2/10 pain in the Rt knee at night ( 09/19/2018)     Status  On-going      PT LONG TERM GOAL #5   Title  improve FOTO =/< 41% limited ( 09/19/2018)     Status  On-going            Plan - 09/05/18 1343    Clinical Impression Statement  Worked on standing exercises for hip and knee strength, balance without pain increase in leg.  His swelling is much improved, using heat on hip at home.  Sees cardiologist today    PT Treatment/Interventions  Iontophoresis 4mg /ml Dexamethasone;Balance training;Passive range of motion;Neuromuscular re-education;Gait training;Stair training;Moist Heat;Functional mobility training;Ultrasound;Manual techniques;Dry needling;Joint Manipulations;Patient/family education;Vasopneumatic Device;Electrical Stimulation;Therapeutic exercise;Cryotherapy    PT Next Visit Plan  progress step ups to 8", add in leg press,  consider DN if Rt quad/hamstrings still tight     PT Home Exercise Plan  level 1 exercises from HHPT , hamstrign, ITB    Consulted and Agree with Plan of Care  Patient       Patient will benefit from skilled therapeutic intervention in order to improve the following deficits and impairments:  Difficulty walking, Increased edema, Decreased strength, Decreased range of motion, Increased muscle spasms, Pain  Visit Diagnosis: Stiffness of right knee, not elsewhere classified  Localized edema  Muscle weakness (generalized)  Acute pain of right knee  Gait difficulty     Problem List Patient Active Problem List   Diagnosis Date Noted  . History of total right knee replacement 07/17/2018  . Primary osteoarthritis of right knee 05/31/2018  . History of tuberculosis 11/05/2015  . Orthostatic hypotension  11/05/2015  . Constipation 11/05/2015  . Thrombocytopenia (HCC) 11/05/2015  . Normocytic anemia 11/05/2015  . Psoriatic arthritis (HCC) 11/03/2015  . Essential hypertension 11/03/2015  . Osteoarthritis of left knee 11/03/2015  . Status  post total knee replacement, left 11/03/2015  . Transaminitis 02/02/2013  . Pulmonary infiltrate 01/29/2013  . Hyponatremia 01/24/2013  . Psoriasis 01/23/2013  . Fever, unknown origin 01/23/2013  . Immunocompromised state (HCC) 01/23/2013    Dolora Ridgely 09/05/2018, 1:47 PM  [facility] [address] [place], [place], [address] Phone: [phone]   Fax:  [phone]  Name: Alan Roman MRN: [id number] Date of Birth: 12/24/1945  Karie Mainland, PT 09/05/18 1:47 PM Phone: [phone] Fax: [phone]

## 2018-09-07 ENCOUNTER — Ambulatory Visit: Payer: Medicare Other | Admitting: Physical Therapy

## 2018-09-10 ENCOUNTER — Ambulatory Visit: Payer: Medicare Other | Admitting: Physical Therapy

## 2018-09-10 DIAGNOSIS — M25661 Stiffness of right knee, not elsewhere classified: Secondary | ICD-10-CM

## 2018-09-10 DIAGNOSIS — R269 Unspecified abnormalities of gait and mobility: Secondary | ICD-10-CM

## 2018-09-10 DIAGNOSIS — R600 Localized edema: Secondary | ICD-10-CM | POA: Diagnosis not present

## 2018-09-10 DIAGNOSIS — M25561 Pain in right knee: Secondary | ICD-10-CM

## 2018-09-10 DIAGNOSIS — M6281 Muscle weakness (generalized): Secondary | ICD-10-CM

## 2018-09-10 NOTE — Patient Instructions (Signed)
Step 1  Step 2  Clamshell with Resistance reps: 10  sets: 2  hold: 5  daily: 1  weekly: 7 Setup  Begin by lying on your side with your knees bent 90 degrees, hips and shoulders stacked, and a resistance loop secured around your legs.  Movement  Raise your top knee away from the bottom one, then slowly return to the starting position.  Tip  Make sure not to roll your hips forward or backward during the exercise. Step 1  Step 2  ITB Stretch at Wall  reps: 3  sets: 1  hold: 30  daily: 1  weekly: 7 Setup  Begin in a standing upright position next to a wall. Cross your outside leg over the other and place your arm against the wall. Movement  Gently push your hip toward the wall until you feel a stretch in the side of your leg and hold. Tip  Make sure not to lean forward or backward. Step 1  Step 2  Supine ITB Stretch reps: 3  sets: 1  hold: 30  daily: 1  weekly: 7 Setup  Begin lying on your back with your knees bent and feet resting on the floor. Movement  Lift one leg and place it on the outside of your opposite leg, then gently pull that leg downward until you feel a stretch on the outside of your thigh. Tip  Make sure to keep your hips and shoulders on the ground. Do not stretch through pain.

## 2018-09-10 NOTE — Therapy (Signed)
[facility] [address] [place], [place], [address] Phone: [phone]   Fax:  [phone]  Physical Therapy Treatment  Patient Details  Name: Alan Roman MRN: [id number] Date of Birth: 04-17-1946 Referring Provider (PT): Dr Norlene Campbell   Encounter Date: 09/10/2018  PT End of Session - 09/10/18 1110    Visit Number  8    Number of Visits  12    Date for PT Re-Evaluation  09/19/18    Authorization Type  pnote at 10th visit    PT Start Time  1102    PT Stop Time  1145    PT Time Calculation (min)  43 min    Activity Tolerance  Patient tolerated treatment well    Behavior During Therapy  [facility] for tasks assessed/performed       Past Medical History:  Diagnosis Date  . Acute bronchiolitis due to other infectious organisms 01/26/2013  . Arthritis    "knees" (11/03/2015)  . Extrapulmonary TB (tuberculosis) 02/02/2013  . High risk medication use 12/27/2016  . History of hiatal hernia 2007; 2009  . Hypertension   . Pleural effusion 01/26/2013    Past Surgical History:  Procedure Laterality Date  . CATARACT EXTRACTION W/ INTRAOCULAR LENS  IMPLANT, BILATERAL Bilateral   . HERNIA REPAIR    . HIATAL HERNIA REPAIR  2007; 2009  . JOINT REPLACEMENT    . TOTAL KNEE ARTHROPLASTY Left 11/03/2015  . TOTAL KNEE ARTHROPLASTY Left 11/03/2015   Procedure: Left Total Knee Arthroplasty;  Surgeon: Valeria Batman, MD;  Location[facility] OR;  Service: Orthopedics;  Laterality: Left;  . TOTAL KNEE ARTHROPLASTY Right 07/17/2018   Procedure: RIGHT TOTAL KNEE ARTHROPLASTY;  Surgeon: Valeria Batman, MD;  Location: [facility] OR;  Service: Orthopedics;  Laterality: Right;  Marland Kitchen VIDEO BRONCHOSCOPY Bilateral 01/31/2013   Procedure: VIDEO BRONCHOSCOPY WITHOUT FLUORO;  Surgeon: Merwyn Katos, MD;  Location: [facility] ENDOSCOPY;  Service: Cardiopulmonary;  Laterality: Bilateral;    There were no vitals filed for this visit.    [facility] Adult PT Treatment/Exercise -  09/10/18 0001      Knee/Hip Exercises: Stretches   Active Hamstring Stretch  Right;2 reps    ITB Stretch  Right    ITB Stretch Limitations  multiple reps for ITB/TFL , used wall, countertop given for HEP and with strap 3 x 30 sec    Piriformis Stretch Limitations  outer hip with legs crossed x 30 sec x 3 R       Knee/Hip Exercises: Aerobic   Stationary Bike  L2 x 6 min       Knee/Hip Exercises: Standing   Forward Lunges  Right;1 set;10 reps    Hip Abduction  Stengthening;Both;1 set;15 reps    Lateral Step Up  Right;1 set;20 reps;Hand Hold: 0;Step Height: 8"    Lateral Step Up Limitations  intermittent hold to bar     Forward Step Up  Right;2 sets;Hand Hold: 1;Hand Hold: 0;Step Height: 6";Step Height: 8"      Knee/Hip Exercises: Supine   Short Arc Quad Sets  Strengthening;Right;1 set;20 reps    Straight Leg Raises  Strengthening;Right;1 set;15 reps      Knee/Hip Exercises: Sidelying   Clams  green band x 20       Manual Therapy   Joint Mobilization  Gr II extension     Soft tissue mobilization  STM Rt quad, ITB and hamstring with roller  and hands  PT Education - 09/10/18 1139    Education Details  ITB stretching    Person(s) Educated  Patient    Methods  Explanation;Handout;Demonstration    Comprehension  Verbalized understanding;Returned demonstration;Verbal cues required       PT Short Term Goals - 08/22/18 1243      PT SHORT TERM GOAL #1   Title  ------      PT SHORT TERM GOAL #2   Title  -------      PT SHORT TERM GOAL #3   Title  -------      PT SHORT TERM GOAL #4   Title  -------        PT Long Term Goals - 08/29/18 1515      PT LONG TERM GOAL #1   Title  I with advanced HEP to include proprioception and a walking program ( 09/19/2018)     Status  On-going      PT LONG TERM GOAL #2   Title  improve Rt knee ROM 0 extesion to 125 degrees flexion to assist with daily mobility ( 09/19/2018)     Status  On-going      PT LONG TERM GOAL  #3   Title  increase strength Rt hip and knee =/> 5-/5 to allow ambulation on even/uneven surfaces without AD ( 09/19/2018)     Status  On-going      PT LONG TERM GOAL #4   Title  report no more than 1-2/10 pain in the Rt knee at night ( 09/19/2018)     Status  On-going      PT LONG TERM GOAL #5   Title  improve FOTO =/< 41% limited ( 09/19/2018)     Status  On-going            Plan - 09/10/18 1242    Clinical Impression Statement  Multiple strethces offered for Rt TFL, ITB which were effective.  Extension was - 5 deg in supine. Able to do step ups with 8 inch step without UEs but cues to fully extend hips.  Working on [employer] mechanics and alignment.  Pt motivated pain <2/10 with all mobility.     PT Treatment/Interventions  Iontophoresis 4mg /ml Dexamethasone;Balance training;Passive range of motion;Neuromuscular re-education;Gait training;Stair training;Moist Heat;Functional mobility training;Ultrasound;Manual techniques;Dry needling;Joint Manipulations;Patient/family education;Vasopneumatic Device;Electrical Stimulation;Therapeutic exercise;Cryotherapy    PT Next Visit Plan  check HEP and upgrade? progress step ups to 8", add in leg press,  consider DN if Rt quad/hamstrings still tight     Consulted and Agree with Plan of Care  Patient       Patient will benefit from skilled therapeutic intervention in order to improve the following deficits and impairments:  Difficulty walking, Increased edema, Decreased strength, Decreased range of motion, Increased muscle spasms, Pain  Visit Diagnosis: Stiffness of right knee, not elsewhere classified  Localized edema  Muscle weakness (generalized)  Acute pain of right knee  Gait difficulty     Problem List Patient Active Problem List   Diagnosis Date Noted  . History of total right knee replacement 07/17/2018  . Primary osteoarthritis of right knee 05/31/2018  . History of tuberculosis 11/05/2015  . Orthostatic hypotension 11/05/2015   . Constipation 11/05/2015  . Thrombocytopenia (HCC) 11/05/2015  . Normocytic anemia 11/05/2015  . Psoriatic arthritis (HCC) 11/03/2015  . Essential hypertension 11/03/2015  . Osteoarthritis of left knee 11/03/2015  . Status post total knee replacement, left 11/03/2015  . Pulmonary infiltrate 01/29/2013  . Psoriasis 01/23/2013  . Immunocompromised state (  HCC) 01/23/2013    PAA,JENNIFER 09/10/2018, 12:45 PM  [facility] [address] [place], [place], [address] Phone: [phone]   Fax:  [phone]  Name: Alan Roman MRN: [id number] Date of Birth: [birth year]  Karie MainlandJennifer Paa, PT 09/10/18 12:45 PM Phone: [phone] Fax: [phone]

## 2018-09-12 ENCOUNTER — Encounter: Payer: Self-pay | Admitting: Physical Therapy

## 2018-09-12 ENCOUNTER — Ambulatory Visit: Payer: Medicare Other | Admitting: Physical Therapy

## 2018-09-12 DIAGNOSIS — R269 Unspecified abnormalities of gait and mobility: Secondary | ICD-10-CM

## 2018-09-12 DIAGNOSIS — M6281 Muscle weakness (generalized): Secondary | ICD-10-CM

## 2018-09-12 DIAGNOSIS — R600 Localized edema: Secondary | ICD-10-CM

## 2018-09-12 DIAGNOSIS — M25561 Pain in right knee: Secondary | ICD-10-CM

## 2018-09-12 DIAGNOSIS — M25661 Stiffness of right knee, not elsewhere classified: Secondary | ICD-10-CM

## 2018-09-12 NOTE — Therapy (Signed)
[facility] [address] [place], [place], [address] Phone: [phone]   Fax:  [phone]  Physical Therapy Treatment  Patient Details  Name: Alan Roman MRN: [id number] Date of Birth: [birth year] Referring Provider (PT): Dr Norlene CampbellPeter Whitfield   Encounter Date: 09/12/2018  PT End of Session - 09/12/18 1350    Visit Number  9    Number of Visits  12    Date for PT Re-Evaluation  09/19/18    Authorization Type  pnote at 10th visit    PT Start Time  1304    PT Stop Time  1355    PT Time Calculation (min)  51 min    Activity Tolerance  Patient tolerated treatment well    Behavior During Therapy  [facility] for tasks assessed/performed       Past Medical History:  Diagnosis Date  . Acute bronchiolitis due to other infectious organisms 01/26/2013  . Arthritis    "knees" (11/03/2015)  . Extrapulmonary TB (tuberculosis) 02/02/2013  . High risk medication use 12/27/2016  . History of hiatal hernia 2007; 2009  . Hypertension   . Pleural effusion 01/26/2013    Past Surgical History:  Procedure Laterality Date  . CATARACT EXTRACTION W/ INTRAOCULAR LENS  IMPLANT, BILATERAL Bilateral   . HERNIA REPAIR    . HIATAL HERNIA REPAIR  2007; 2009  . JOINT REPLACEMENT    . TOTAL KNEE ARTHROPLASTY Left 11/03/2015  . TOTAL KNEE ARTHROPLASTY Left 11/03/2015   Procedure: Left Total Knee Arthroplasty;  Surgeon: Valeria BatmanPeter W Whitfield, MD;  [facility];  Service: Orthopedics;  Laterality: Left;  . TOTAL KNEE ARTHROPLASTY Right 07/17/2018   Procedure: RIGHT TOTAL KNEE ARTHROPLASTY;  Surgeon: Valeria BatmanWhitfield, Peter W, MD;  [facility];  Service: Orthopedics;  Laterality: Right;  Marland Kitchen. VIDEO BRONCHOSCOPY Bilateral 01/31/2013   Procedure: VIDEO BRONCHOSCOPY WITHOUT FLUORO;  Surgeon: Merwyn Katosavid B Simonds, MD;  [facility];  Service: Cardiopulmonary;  Laterality: Bilateral;    There were no vitals filed for this visit.  Subjective Assessment - 09/12/18 1310    Subjective  Still has nighttime hip pain, uses heat.  Knee does not really hurt right now. SOme post knee soreness.    Currently in Pain?  No/denies         [facility] - 09/12/18 0001      Knee/Hip Exercises: Aerobic   Stationary Bike  L3 x 6 min       Knee/Hip Exercises: Machines for Strengthening   Cybex Leg Press  1 plate x 15, 2 plates x 10 and single leg 1 plate x 10 small ROM       Knee/Hip Exercises: Standing   Rebounder  squat hold x 15 catch and toss,       Knee/Hip Exercises: Supine   Bridges  Strengthening;Both;1 set;10 reps    Bridges with Clamshell  Both;1 set;10 reps    Heel Prop for Knee Extension Limitations  1 min     Patellar Mobs  gentle       Manual Therapy   Joint Mobilization  Gr II extension     Soft tissue mobilization  STM to Rt post knee , ITB.  Medial hamstring ropey and sore     Passive ROM  extension                PT Short Term Goals - 08/22/18 1243      PT SHORT TERM GOAL #1   Title  ------  PT SHORT TERM GOAL #2   Title  -------      PT SHORT TERM GOAL #3   Title  -------      PT SHORT TERM GOAL #4   Title  -------        PT Long Term Goals - 08/29/18 1515      PT LONG TERM GOAL #1   Title  I with advanced HEP to include proprioception and a walking program ( 09/19/2018)     Status  On-going      PT LONG TERM GOAL #2   Title  improve Rt knee ROM 0 extesion to 125 degrees flexion to assist with daily mobility ( 09/19/2018)     Status  On-going      PT LONG TERM GOAL #3   Title  increase strength Rt hip and knee =/> 5-/5 to allow ambulation on even/uneven surfaces without AD ( 09/19/2018)     Status  On-going      PT LONG TERM GOAL #4   Title  report no more than 1-2/10 pain in the Rt knee at night ( 09/19/2018)     Status  On-going      PT LONG TERM GOAL #5   Title  improve FOTO =/< 41% limited ( 09/19/2018)     Status  On-going            Plan - 09/12/18 1353    Clinical  Impression Statement  Patient is up to date on all HEP.  He does them 2 x per day.  He cont to have knee stiffness into extension, anterolateral hip pain due to overuse of TFL.  He may benefit from dry needling next visit.      PT Treatment/Interventions  Iontophoresis 4mg /ml Dexamethasone;Balance training;Passive range of motion;Neuromuscular re-education;Gait training;Stair training;Moist Heat;Functional mobility training;Ultrasound;Manual techniques;Dry needling;Joint Manipulations;Patient/family education;Vasopneumatic Device;Electrical Stimulation;Therapeutic exercise;Cryotherapy    PT Next Visit Plan  HEP up to date.  Try standing hip with bands?  consider DN to TFL, quad, HS?     PT Home Exercise Plan  level 1 exercises from HHPT , hamstrign, ITB, squat, hip abd, ext anf bridge, ITB stretch, knee ROM and sidelying clam     Consulted and Agree with Plan of Care  Patient       Patient will benefit from skilled therapeutic intervention in order to improve the following deficits and impairments:  Difficulty walking, Increased edema, Decreased strength, Decreased range of motion, Increased muscle spasms, Pain  Visit Diagnosis: Stiffness of right knee, not elsewhere classified  Localized edema  Muscle weakness (generalized)  Acute pain of right knee  Gait difficulty     Problem List Patient Active Problem List   Diagnosis Date Noted  . History of total right knee replacement 07/17/2018  . Primary osteoarthritis of right knee 05/31/2018  . History of tuberculosis 11/05/2015  . Orthostatic hypotension 11/05/2015  . Constipation 11/05/2015  . Thrombocytopenia (HCC) 11/05/2015  . Normocytic anemia 11/05/2015  . Psoriatic arthritis (HCC) 11/03/2015  . Essential hypertension 11/03/2015  . Osteoarthritis of left knee 11/03/2015  . Status post total knee replacement, left 11/03/2015  . Pulmonary infiltrate 01/29/2013  . Psoriasis 01/23/2013  . Immunocompromised state (HCC) 01/23/2013     Emalene Welte 09/12/2018, 2:23 PM  [facility] [address] [place], [place], [address] Phone: [phone]   Fax:  [phone]  Name: Alan Roman MRN: [id number] Date of Birth: December 30, 1945  Karie Mainland, PT 09/12/18 2:26 PM Phone: [phone] Fax:  [phone]  

## 2018-09-12 NOTE — Patient Instructions (Signed)
Mini Squat (VMO)    Stand in doorway with pillow between knees. Squeeze knees together while performing a mini-squat to 30 - 45. Hold _5___ seconds. Repeat __10__ times. Do __1__ sessions per day.  http://gt2.exer.us/283   Copyright  VHI. All rights reserved.  ABDUCTION: Standing (Active)    Stand, feet flat. Lift right leg out to side. . Complete ___2 sets of _10-20 __ repetitions. Perform __1_ sessions per day.  http://gtsc.exer.us/110   Copyright  VHI. All rights reserved.  Standing Hip Extension    Bring leg back as far as possible. Repeat with other leg. Repeat __10-20__ times. Do _1___ sessions per day.  http://gt2.exer.us/395   Copyright  VHI. All rights reserved.       Bridging    Slowly raise buttocks from floor, keeping stomach tight. Repeat ___10_ times per set. Do _1-2___ sets per session. Do __1-2__ sessions per day.  http://orth.exer.us/1096   Copyright  VHI. All rights reserved.

## 2018-09-14 ENCOUNTER — Ambulatory Visit: Payer: Medicare Other | Admitting: Physical Therapy

## 2018-09-14 ENCOUNTER — Encounter: Payer: Self-pay | Admitting: Physical Therapy

## 2018-09-14 DIAGNOSIS — M25661 Stiffness of right knee, not elsewhere classified: Secondary | ICD-10-CM

## 2018-09-14 DIAGNOSIS — M6281 Muscle weakness (generalized): Secondary | ICD-10-CM | POA: Diagnosis not present

## 2018-09-14 DIAGNOSIS — R600 Localized edema: Secondary | ICD-10-CM

## 2018-09-14 DIAGNOSIS — R269 Unspecified abnormalities of gait and mobility: Secondary | ICD-10-CM | POA: Diagnosis not present

## 2018-09-14 DIAGNOSIS — M25561 Pain in right knee: Secondary | ICD-10-CM | POA: Diagnosis not present

## 2018-09-14 DIAGNOSIS — I10 Essential (primary) hypertension: Secondary | ICD-10-CM | POA: Diagnosis not present

## 2018-09-14 DIAGNOSIS — M159 Polyosteoarthritis, unspecified: Secondary | ICD-10-CM | POA: Diagnosis not present

## 2018-09-14 NOTE — Patient Instructions (Signed)

## 2018-09-14 NOTE — Therapy (Signed)
[facility] [address] [place], [place], [address] Phone: [phone]   Fax:  [phone]    Progress Note Reporting Period 1/22/2020to 09/14/2018  See note below for Objective Data and Assessment of Progress/Goals.       Physical Therapy Treatment  Patient Details  Name: Alan Roman MRN: [id number] Date of Birth: 1946-04-03 Referring Provider (PT): Dr Norlene Campbell   Encounter Date: 09/14/2018  PT End of Session - 09/14/18 1106    Visit Number  10    Number of Visits  12    Date for PT Re-Evaluation  09/19/18    Authorization Type  pnote at 10th visit    PT Start Time  1106    PT Stop Time  1200    PT Time Calculation (min)  54 min    Activity Tolerance  Patient tolerated treatment well       Past Medical History:  Diagnosis Date  . Acute bronchiolitis due to other infectious organisms 01/26/2013  . Arthritis    "knees" (11/03/2015)  . Extrapulmonary TB (tuberculosis) 02/02/2013  . High risk medication use 12/27/2016  . History of hiatal hernia 2007; 2009  . Hypertension   . Pleural effusion 01/26/2013    Past Surgical History:  Procedure Laterality Date  . CATARACT EXTRACTION W/ INTRAOCULAR LENS  IMPLANT, BILATERAL Bilateral   . HERNIA REPAIR    . HIATAL HERNIA REPAIR  2007; 2009  . JOINT REPLACEMENT    . TOTAL KNEE ARTHROPLASTY Left 11/03/2015  . TOTAL KNEE ARTHROPLASTY Left 11/03/2015   Procedure: Left Total Knee Arthroplasty;  Surgeon: Valeria Batman, MD;  [facility];  Service: Orthopedics;  Laterality: Left;  . TOTAL KNEE ARTHROPLASTY Right 07/17/2018   Procedure: RIGHT TOTAL KNEE ARTHROPLASTY;  Surgeon: Valeria Batman, MD;  [facility];  Service: Orthopedics;  Laterality: Right;  Marland Kitchen VIDEO BRONCHOSCOPY Bilateral 01/31/2013   Procedure: VIDEO BRONCHOSCOPY WITHOUT FLUORO;  Surgeon: Merwyn Katos, MD;  Location: [facility] ENDOSCOPY;  Service: Cardiopulmonary;  Laterality: Bilateral;    There  were no vitals filed for this visit.  Subjective Assessment - 09/14/18 1106    Currently in Pain?  Yes    Pain Score  2     Pain Location  Leg    Pain Orientation  Right    Pain Descriptors / Indicators  Tightness    Pain Type  Surgical pain    Pain Onset  More than a month ago    Pain Frequency  Intermittent    Aggravating Factors   night and prolonged walking    Pain Relieving Factors  ice and heat                       [facility] Adult PT Treatment/Exercise - 09/14/18 0001      Knee/Hip Exercises: Stretches   Lobbyist  Right;2 reps;60 seconds   supine with strap   ITB Stretch  Right;2 reps;60 seconds   cross body with strap   Piriformis Stretch  Right;2 reps;30 seconds      Knee/Hip Exercises: Aerobic   Stationary Bike  L3 x     Nustep  L5x5' after DN       Knee/Hip Exercises: Seated   Long Arc Quad  Both;10 reps    Sit to [employer]  20 reps;without UE support;with UE support   SLS Rt to high mat, intermittent UE assist     Modalities   Modalities  Moist  Heat      Moist Heat Therapy   Number Minutes Moist Heat  15 Minutes    Moist Heat Location  --   Rt thigh and lateral hip      Manual Therapy   Manual therapy comments  skilled palpation and monitoring during dry needling    Soft tissue mobilization  STM to Rt quad, TFL/ITB and lateral hamstring        Trigger Point Dry Needling - 09/14/18 1131    Consent Given?  Yes    Education Handout Provided  Yes    Muscles Treated Lower Body  Quadriceps;Hamstring   TFL   Quadriceps Response  Twitch response elicited;Palpable increased muscle length   Rt vastus lateralis   Hamstring Response  Twitch response elicited;Palpable increased muscle length   Rt semitenindous          PT Education - 09/14/18 1110    Education Details  DN    Person(s) Educated  Patient    Methods  Explanation;Handout    Comprehension  Verbalized understanding       PT Short Term Goals - 08/22/18 1243      PT  SHORT TERM GOAL #1   Title  ------      PT SHORT TERM GOAL #2   Title  -------      PT SHORT TERM GOAL #3   Title  -------      PT SHORT TERM GOAL #4   Title  -------        PT Long Term Goals - 08/29/18 1515      PT LONG TERM GOAL #1   Title  I with advanced HEP to include proprioception and a walking program ( 09/19/2018)     Status  On-going      PT LONG TERM GOAL #2   Title  improve Rt knee ROM 0 extesion to 125 degrees flexion to assist with daily mobility ( 09/19/2018)     Status  On-going      PT LONG TERM GOAL #3   Title  increase strength Rt hip and knee =/> 5-/5 to allow ambulation on even/uneven surfaces without AD ( 09/19/2018)     Status  On-going      PT LONG TERM GOAL #4   Title  report no more than 1-2/10 pain in the Rt knee at night ( 09/19/2018)     Status  On-going      PT LONG TERM GOAL #5   Title  improve FOTO =/< 41% limited ( 09/19/2018)     Status  On-going            Plan - 09/14/18 1140    Clinical Impression Statement  Pt had good releases with DN and manual work, expected soreness in muscle treated afterwards however pt reported the muscles felt looser.  He is going to be driving 90 min both directions later today so he was given suggestions to keep the muscles loose with this.      Rehab Potential  Excellent    PT Frequency  3x / week    PT Duration  4 weeks    PT Treatment/Interventions  Iontophoresis 4mg /ml Dexamethasone;Balance training;Passive range of motion;Neuromuscular re-education;Gait training;Stair training;Moist Heat;Functional mobility training;Ultrasound;Manual techniques;Dry needling;Joint Manipulations;Patient/family education;Vasopneumatic Device;Electrical Stimulation;Therapeutic exercise;Cryotherapy    PT Next Visit Plan  assess response to DN     Consulted and Agree with Plan of Care  Patient       Patient will benefit from skilled  therapeutic intervention in order to improve the following deficits and impairments:   Difficulty walking, Increased edema, Decreased strength, Decreased range of motion, Increased muscle spasms, Pain  Visit Diagnosis: Stiffness of right knee, not elsewhere classified  Localized edema  Muscle weakness (generalized)  Acute pain of right knee  Gait difficulty     Problem List Patient Active Problem List   Diagnosis Date Noted  . History of total right knee replacement 07/17/2018  . Primary osteoarthritis of right knee 05/31/2018  . History of tuberculosis 11/05/2015  . Orthostatic hypotension 11/05/2015  . Constipation 11/05/2015  . Thrombocytopenia (HCC) 11/05/2015  . Normocytic anemia 11/05/2015  . Psoriatic arthritis (HCC) 11/03/2015  . Essential hypertension 11/03/2015  . Osteoarthritis of left knee 11/03/2015  . Status post total knee replacement, left 11/03/2015  . Pulmonary infiltrate 01/29/2013  . Psoriasis 01/23/2013  . Immunocompromised state (HCC) 01/23/2013    Roderic Scarce PT  09/14/2018, 11:50 AM  [facility] [address] [place], [place], [address] Phone: [phone]   Fax:  [phone]  Name: Carvis Mankiewicz MRN: [id number] Date of Birth: 1946/05/09

## 2018-09-17 ENCOUNTER — Ambulatory Visit: Payer: Medicare Other | Admitting: Physical Therapy

## 2018-09-17 ENCOUNTER — Encounter: Payer: Self-pay | Admitting: Physical Therapy

## 2018-09-17 DIAGNOSIS — M6281 Muscle weakness (generalized): Secondary | ICD-10-CM

## 2018-09-17 DIAGNOSIS — R600 Localized edema: Secondary | ICD-10-CM

## 2018-09-17 DIAGNOSIS — M25661 Stiffness of right knee, not elsewhere classified: Secondary | ICD-10-CM | POA: Diagnosis not present

## 2018-09-17 DIAGNOSIS — M25561 Pain in right knee: Secondary | ICD-10-CM | POA: Diagnosis not present

## 2018-09-17 DIAGNOSIS — R269 Unspecified abnormalities of gait and mobility: Secondary | ICD-10-CM

## 2018-09-17 NOTE — Therapy (Signed)
[place], [place], [address] Phone: [phone]   Fax:  [phone]  Physical Therapy Treatment  Patient Details  Name: Alan Roman MRN: [id number] Date of Birth: 1946-03-09 Referring Provider (PT): Dr Joni Fears   Encounter Date: 09/17/2018  PT End of Session - 09/17/18 1117    Visit Number  11    Date for PT Re-Evaluation  09/19/18    Authorization Type  pnote at 10th visit    PT Start Time  1104    PT Stop Time  1200    PT Time Calculation (min)  56 min    Activity Tolerance  Patient tolerated treatment well    Behavior During Therapy  [facility] for tasks assessed/performed       Past Medical History:  Diagnosis Date  . Acute bronchiolitis due to other infectious organisms 01/26/2013  . Arthritis    "knees" (11/03/2015)  . Extrapulmonary TB (tuberculosis) 02/02/2013  . High risk medication use 12/27/2016  . History of hiatal hernia 2007; 2009  . Hypertension   . Pleural effusion 01/26/2013    Past Surgical History:  Procedure Laterality Date  . CATARACT EXTRACTION W/ INTRAOCULAR LENS  IMPLANT, BILATERAL Bilateral   . HERNIA REPAIR    . HIATAL HERNIA REPAIR  2007; 2009  . JOINT REPLACEMENT    . TOTAL KNEE ARTHROPLASTY Left 11/03/2015  . TOTAL KNEE ARTHROPLASTY Left 11/03/2015   Procedure: Left Total Knee Arthroplasty;  Surgeon: Garald Balding, MD;  Location: [place];  Service: Orthopedics;  Laterality: Left;  . TOTAL KNEE ARTHROPLASTY Right 07/17/2018   Procedure: RIGHT TOTAL KNEE ARTHROPLASTY;  Surgeon: Garald Balding, MD;  Location: [facility];  Service: Orthopedics;  Laterality: Right;  Marland Kitchen VIDEO BRONCHOSCOPY Bilateral 01/31/2013   Procedure: VIDEO BRONCHOSCOPY WITHOUT FLUORO;  Surgeon: Wilhelmina Mcardle, MD;  Location: [facility] ENDOSCOPY;  Service: Cardiopulmonary;  Laterality: Bilateral;    There were no vitals filed for this visit.  Subjective Assessment - 09/17/18 1116    Subjective  Hip pain is  gone! no pain in knee today.  Did my exercises all weekend.     Currently in Pain?  No/denies          [facility] Adult PT Treatment/Exercise - 09/17/18 0001      Knee/Hip Exercises: Stretches   Active Hamstring Stretch  Right;3 reps;30 seconds    ITB Stretch  Right;2 reps;30 seconds    Other Knee/Hip Stretches  hip circles x 5 each direction       Knee/Hip Exercises: Aerobic   Stationary Bike  L3 x 6 min       Knee/Hip Exercises: Standing   Heel Raises Limitations  single leg x 10 each leg     Side Lunges  Right;Left;1 set;10 reps    Lateral Step Up  Right;20 reps;Hand Hold: 2;Step Height: 6"    Forward Step Up  Right;1 set;20 reps;Hand Hold: 1;Hand Hold: 0;Step Height: 8"    Functional Squat  1 set;10 reps    Other Standing Knee Exercises  squats with 1 leg on step x 10 each       Knee/Hip Exercises: Supine   Quad Sets  Strengthening;Right;1 set;10 reps    Short Games developer Sets Limitations  pre-SLR     Hip Adduction Isometric  Strengthening;Both;1 set;10 reps    Bridges with [employer]  Strengthening;Both;1 set;10 reps    Bridges with Clamshell  Strengthening;Both;1  set;10 reps    Straight Leg Raise with External Rotation  Strengthening;Right;1 set;15 reps    Patellar Mobs  by PT     Other Supine Knee/Hip Exercises  black band resisted hip extension x 10, followed by stretch       Vasopneumatic   Number Minutes Vasopneumatic   15 minutes    Vasopnuematic Location   Knee    Vasopneumatic Pressure  Medium    Vasopneumatic Temperature   34      Manual Therapy   Manual therapy comments  patellar mobs     Joint Mobilization  Gr II extension     Passive ROM  extension                PT Short Term Goals - 08/22/18 1243      PT SHORT TERM GOAL #1   Title  ------      PT SHORT TERM GOAL #2   Title  -------      PT SHORT TERM GOAL #3   Title  -------      PT SHORT TERM GOAL #4   Title  -------        PT  Long Term Goals - 09/17/18 1147      PT LONG TERM GOAL #1   Title  I with advanced HEP to include proprioception and a walking program ( 09/19/2018)     Status  On-going      PT LONG TERM GOAL #2   Title  improve Rt knee ROM 0 extesion to 125 degrees flexion to assist with daily mobility ( 09/19/2018)     Baseline  -3 to 125    Status  Partially Met      PT LONG TERM GOAL #3   Title  increase strength Rt hip and knee =/> 5-/5 to allow ambulation on even/uneven surfaces without AD ( 09/19/2018)     Status  Unable to assess      PT LONG TERM GOAL #4   Title  report no more than 1-2/10 pain in the Rt knee at night ( 09/19/2018)     Status  On-going      PT LONG TERM GOAL #5   Title  improve FOTO =/< 41% limited ( 09/19/2018)     Status  Unable to assess            Plan - 09/17/18 1147    Clinical Impression Statement  Pt with favorable response to DN has not had hip pain since Friday.  Improved extension today.  Has 1 more visit in [place].      PT Treatment/Interventions  Iontophoresis 54m/ml Dexamethasone;Balance training;Passive range of motion;Neuromuscular re-education;Gait training;Stair training;Moist Heat;Functional mobility training;Ultrasound;Manual techniques;Dry needling;Joint Manipulations;Patient/family education;Vasopneumatic Device;Electrical Stimulation;Therapeutic exercise;Cryotherapy    PT Next Visit Plan  finish up! FOTO, DC, ensure HEP.     PT Home Exercise Plan  level 1 exercises from HHPT , hamstrign, ITB, squat, hip abd, ext anf bridge, ITB stretch, knee ROM and sidelying clam        Patient will benefit from skilled therapeutic intervention in order to improve the following deficits and impairments:  Difficulty walking, Increased edema, Decreased strength, Decreased range of motion, Increased muscle spasms, Pain  Visit Diagnosis: Localized edema  Stiffness of right knee, not elsewhere classified  Muscle weakness (generalized)  Acute pain of right  knee  Gait difficulty     Problem List Patient Active Problem List   Diagnosis Date Noted  . History of  total right knee replacement 07/17/2018  . Primary osteoarthritis of right knee 05/31/2018  . History of tuberculosis 11/05/2015  . Orthostatic hypotension 11/05/2015  . Constipation 11/05/2015  . Thrombocytopenia ([place]) 11/05/2015  . Normocytic anemia 11/05/2015  . Psoriatic arthritis ([place]) 11/03/2015  . Essential hypertension 11/03/2015  . Osteoarthritis of left knee 11/03/2015  . Status post total knee replacement, left 11/03/2015  . Pulmonary infiltrate 01/29/2013  . Psoriasis 01/23/2013  . Immunocompromised state ([place]) 01/23/2013    Shakendra Griffeth 09/17/2018, 11:49 AM  [facility] [address] [place], [place], [address] Phone: [phone]   Fax:  [phone]  Name: Carzell Saldivar MRN: [id number] Date of Birth: 1946-01-04  Raeford Razor, PT 09/17/18 11:50 AM Phone: [phone] Fax: [phone]

## 2018-09-18 ENCOUNTER — Encounter: Payer: Self-pay | Admitting: Rheumatology

## 2018-09-18 ENCOUNTER — Ambulatory Visit (INDEPENDENT_AMBULATORY_CARE_PROVIDER_SITE_OTHER): Payer: Medicare Other | Admitting: Rheumatology

## 2018-09-18 VITALS — BP 139/83 | HR 72 | Resp 14 | Ht 66.0 in | Wt 151.8 lb

## 2018-09-18 DIAGNOSIS — M19072 Primary osteoarthritis, left ankle and foot: Secondary | ICD-10-CM

## 2018-09-18 DIAGNOSIS — D649 Anemia, unspecified: Secondary | ICD-10-CM

## 2018-09-18 DIAGNOSIS — M1711 Unilateral primary osteoarthritis, right knee: Secondary | ICD-10-CM | POA: Diagnosis not present

## 2018-09-18 DIAGNOSIS — A1889 Tuberculosis of other sites: Secondary | ICD-10-CM | POA: Diagnosis not present

## 2018-09-18 DIAGNOSIS — D696 Thrombocytopenia, unspecified: Secondary | ICD-10-CM | POA: Diagnosis not present

## 2018-09-18 DIAGNOSIS — Z96651 Presence of right artificial knee joint: Secondary | ICD-10-CM | POA: Diagnosis not present

## 2018-09-18 DIAGNOSIS — M19071 Primary osteoarthritis, right ankle and foot: Secondary | ICD-10-CM

## 2018-09-18 DIAGNOSIS — L409 Psoriasis, unspecified: Secondary | ICD-10-CM

## 2018-09-18 DIAGNOSIS — Z96652 Presence of left artificial knee joint: Secondary | ICD-10-CM

## 2018-09-18 DIAGNOSIS — Z79899 Other long term (current) drug therapy: Secondary | ICD-10-CM | POA: Diagnosis not present

## 2018-09-18 DIAGNOSIS — I2510 Atherosclerotic heart disease of native coronary artery without angina pectoris: Secondary | ICD-10-CM

## 2018-09-18 DIAGNOSIS — I10 Essential (primary) hypertension: Secondary | ICD-10-CM

## 2018-09-18 DIAGNOSIS — L4050 Arthropathic psoriasis, unspecified: Secondary | ICD-10-CM | POA: Diagnosis not present

## 2018-09-18 NOTE — Patient Instructions (Signed)
Standing Labs We placed an order today for your standing lab work.    Please come back and get your standing labs in March and every 3 months  We have open lab Monday through Friday from 8:30-11:30 AM and 1:30-4:00 PM  at the office of Dr. Torris House.   You may experience shorter wait times on Monday and Friday afternoons. The office is located at [address], [address], [place], [place] [address] No appointment is necessary.   Labs are drawn by [facility].  You may receive a bill from [facility] for your lab work.  If you wish to have your labs drawn at another location, please call the office 24 hours in advance to send orders.  If you have any questions regarding directions or hours of operation,  please call [phone].   Just as a reminder please drink plenty of water prior to coming for your lab work. Thanks!  

## 2018-09-19 ENCOUNTER — Encounter: Payer: Self-pay | Admitting: Physical Therapy

## 2018-09-19 ENCOUNTER — Ambulatory Visit: Payer: Medicare Other | Admitting: Physical Therapy

## 2018-09-19 DIAGNOSIS — M25561 Pain in right knee: Secondary | ICD-10-CM

## 2018-09-19 DIAGNOSIS — R600 Localized edema: Secondary | ICD-10-CM

## 2018-09-19 DIAGNOSIS — M6281 Muscle weakness (generalized): Secondary | ICD-10-CM

## 2018-09-19 DIAGNOSIS — M25661 Stiffness of right knee, not elsewhere classified: Secondary | ICD-10-CM | POA: Diagnosis not present

## 2018-09-19 DIAGNOSIS — R269 Unspecified abnormalities of gait and mobility: Secondary | ICD-10-CM | POA: Diagnosis not present

## 2018-09-19 NOTE — Patient Instructions (Signed)
Step 1  Step 2  Tandem Stance with Support reps: 5  sets: 1  hold: 30  daily: 1  weekly: 7 Setup  Begin in a standing upright position holding on to a stable object for support. Movement  Place one foot directly in front of the other so you are standing heel-to-toe. Hold this position. Tip  Make sure to maintain your balance during the exercise. Step 1  Step 2  Single Leg Balance on Pillow reps: 5  sets: 1  hold: 30  daily: 1  weekly: 7 Setup  Begin standing tall with a pillow on the ground in front of you. Movement  Step onto the pillow, then, once you are balanced, slowly lift one foot up to stand on one leg. Hold this position. Tip  Make sure to stay standing tall and keep your balance during the exercise. Step 1  Step 2  [place] Stance on Foam Pad with Head Rotation reps: 5  sets: 1  hold: 30  daily: 1  weekly: 7 Setup  Begin in a standing upright position on a foam pad with your feet together and arms resting at your sides. Movement  Turn your head to look over one shoulder, then return to the starting position and repeat to the other side. Continue these movements. Tip  Make sure to maintain an upright posture as you stand on the foam and move your head at the same speed as your eyes.

## 2018-09-19 NOTE — Therapy (Addendum)
[place], [place], [address] Phone: [phone]   Fax:  [phone]  Physical Therapy Treatment/Discharge   Patient Details  Name: Alan Roman MRN: [id number] Date of Birth: 09/23/1945 Referring Provider (PT): Dr Joni Fears   Encounter Date: 09/19/2018  PT End of Session - 09/19/18 1313    Visit Number  12    PT Start Time  1300    PT Stop Time  4373    PT Time Calculation (min)  55 min    Activity Tolerance  Patient tolerated treatment well    Behavior During Therapy  [facility] for tasks assessed/performed       Past Medical History:  Diagnosis Date  . Acute bronchiolitis due to other infectious organisms 01/26/2013  . Arthritis    "knees" (11/03/2015)  . Extrapulmonary TB (tuberculosis) 02/02/2013  . High risk medication use 12/27/2016  . History of hiatal hernia 2007; 2009  . Hypertension   . Pleural effusion 01/26/2013    Past Surgical History:  Procedure Laterality Date  . CATARACT EXTRACTION W/ INTRAOCULAR LENS  IMPLANT, BILATERAL Bilateral   . HERNIA REPAIR    . HIATAL HERNIA REPAIR  2007; 2009  . JOINT REPLACEMENT    . TOTAL KNEE ARTHROPLASTY Left 11/03/2015  . TOTAL KNEE ARTHROPLASTY Left 11/03/2015   Procedure: Left Total Knee Arthroplasty;  Surgeon: Garald Balding, MD;  Location: [place];  Service: Orthopedics;  Laterality: Left;  . TOTAL KNEE ARTHROPLASTY Right 07/17/2018   Procedure: RIGHT TOTAL KNEE ARTHROPLASTY;  Surgeon: Garald Balding, MD;  Location: [place];  Service: Orthopedics;  Laterality: Right;  Marland Kitchen VIDEO BRONCHOSCOPY Bilateral 01/31/2013   Procedure: VIDEO BRONCHOSCOPY WITHOUT FLUORO;  Surgeon: Wilhelmina Mcardle, MD;  Location: [facility] ENDOSCOPY;  Service: Cardiopulmonary;  Laterality: Bilateral;    There were no vitals filed for this visit.  Subjective Assessment - 09/19/18 1304    Subjective  No more hip pain, no knee pain.  Ready for DC.  Walks with cane in the community.       Currently in Pain?  No/denies         [facility] PT Assessment - 09/19/18 0001      Functional Tests   Functional tests  Sit to Stand      Sit to Stand   Comments  x 5,  12 sec , No UE       AROM   Right Knee Extension  3    Right Knee Flexion  119   with overpressure       [facility] Adult PT Treatment/Exercise - 09/19/18 0001      Ambulation/Gait   Gait Comments  gait: stepping high over hurdles, sidesteping over hurdles and larger Range sidestep over hurdle , close supevision , no LOB used gait belt       Knee/Hip Exercises: Stretches   Active Hamstring Stretch  Right;3 reps;30 seconds    ITB Stretch  Right;2 reps;30 seconds      Knee/Hip Exercises: Aerobic   Stationary Bike  L3 x 6 min       Knee/Hip Exercises: Standing   SLS with Vectors  used green T band x 10 each plane Rt and Lt LE 4 way hip     Other Standing Knee Exercises  hip flexion green band x 10     Other Standing Knee Exercises  semi circles with wgt. on Rt LE on foam x 10 , 1 Ue assist  Knee/Hip Exercises: Seated   Sit to Sand  --   12 sec x 5 no UE support      Cryotherapy   Number Minutes Cryotherapy  10 Minutes    Cryotherapy Location  Knee    Type of Cryotherapy  Ice pack      Manual Therapy   Soft tissue mobilization  Rt quads, scar tissue massage , brief              PT Education - 09/19/18 1413    Education Details  DC, HEP    Person(s) Educated  Patient    Methods  Explanation    Comprehension  Verbalized understanding       PT Short Term Goals - 08/22/18 1243      PT SHORT TERM GOAL #1   Title  ------      PT SHORT TERM GOAL #2   Title  -------      PT SHORT TERM GOAL #3   Title  -------      PT SHORT TERM GOAL #4   Title  -------        PT Long Term Goals - 09/19/18 1334      PT LONG TERM GOAL #1   Title  I with advanced HEP to include proprioception and a walking program ( 09/19/2018)     Status  Achieved      PT LONG TERM GOAL #2   Title  improve Rt  knee ROM 0 extesion to 125 degrees flexion to assist with daily mobility ( 09/19/2018)     Status  Partially Met      PT LONG TERM GOAL #3   Title  increase strength Rt hip and knee =/> 5-/5 to allow ambulation on even/uneven surfaces without AD ( 09/19/2018)     Status  Achieved      PT LONG TERM GOAL #4   Title  report no more than 1-2/10 pain in the Rt knee at night ( 09/19/2018)     Baseline  occasional pain in knee if it gets cold     Status  Partially Met      PT LONG TERM GOAL #5   Title  improve FOTO =/< 41% limited ( 09/19/2018)     Status  Achieved            Plan - 09/19/18 1353    Clinical Impression Statement  Patient AROM 0-5-119.  Knee strength is 5-/5 , mild weakness in hip flexors in sitting.  goals partially met to met.  He is pleased with progress and should be released to drive by MD in a couple of weeks. He is I with HEP and motivated to continue improving.  FOTO improved to 33% limited (22% improved).     PT Next Visit Plan  NA    PT Home Exercise Plan  level 1 exercises from HHPT , hamstrign, ITB, squat, hip abd, ext anf bridge, ITB stretch, knee ROM and sidelying clam     Consulted and Agree with Plan of Care  Patient       Patient will benefit from skilled therapeutic intervention in order to improve the following deficits and impairments:     Visit Diagnosis: Localized edema  Stiffness of right knee, not elsewhere classified  Muscle weakness (generalized)  Acute pain of right knee  Gait difficulty     Problem List Patient Active Problem List   Diagnosis Date Noted  . History of total right knee replacement  07/17/2018  . Primary osteoarthritis of right knee 05/31/2018  . History of tuberculosis 11/05/2015  . Orthostatic hypotension 11/05/2015  . Constipation 11/05/2015  . Thrombocytopenia ([place]) 11/05/2015  . Normocytic anemia 11/05/2015  . Psoriatic arthritis ([place]) 11/03/2015  . Essential hypertension 11/03/2015  . Osteoarthritis of left  knee 11/03/2015  . Status post total knee replacement, left 11/03/2015  . Pulmonary infiltrate 01/29/2013  . Psoriasis 01/23/2013  . Immunocompromised state ([place]) 01/23/2013    Alan Roman 09/19/2018, 2:14 PM  [facility] [address] [place], [place], [address] Phone: [phone]   Fax:  [phone]  Name: Alan Roman MRN: [id number] Date of Birth: 1945/10/05   PHYSICAL THERAPY DISCHARGE SUMMARY  Visits from Start of Care: 12  Current functional level related to goals / functional outcomes: See above    Remaining deficits: Knee ROM is much improved but still stiff end range extension and flexion.  Pain is minimal to none.  Hip pain resolved. Gait with cane is safe.  Will work on balance at home intermittently.    Education / Equipment: HEP , balance, ROM, edema Plan: Patient agrees to discharge.  Patient goals were met. Patient is being discharged due to meeting the stated rehab goals.  ?????     Raeford Razor, PT 09/19/18 2:14 PM Phone: [phone] Fax: [phone]

## 2018-10-01 DIAGNOSIS — H04123 Dry eye syndrome of bilateral lacrimal glands: Secondary | ICD-10-CM | POA: Diagnosis not present

## 2018-10-01 DIAGNOSIS — Z961 Presence of intraocular lens: Secondary | ICD-10-CM | POA: Diagnosis not present

## 2018-10-02 ENCOUNTER — Encounter (INDEPENDENT_AMBULATORY_CARE_PROVIDER_SITE_OTHER): Payer: Self-pay | Admitting: Orthopaedic Surgery

## 2018-10-02 ENCOUNTER — Ambulatory Visit (INDEPENDENT_AMBULATORY_CARE_PROVIDER_SITE_OTHER): Payer: Medicare Other | Admitting: Orthopaedic Surgery

## 2018-10-02 VITALS — BP 138/83 | HR 77 | Ht 66.0 in | Wt 146.0 lb

## 2018-10-02 DIAGNOSIS — Z96651 Presence of right artificial knee joint: Secondary | ICD-10-CM

## 2018-10-02 NOTE — Progress Notes (Signed)
Office Visit Note   Patient: Alan Roman           Date of Birth: 1946-04-15           MRN: [id number] Visit Date: 10/02/2018              Requested by: Ignatius Specking, MD [address] [place], [place] [address] PCP: Ignatius Specking, MD   Assessment & Plan: Visit Diagnoses:  1. S/P TKR (total knee replacement) using cement, right     Plan:  #1: Continue his exercise program as instructed #2: Follow back up 6 weeks for recheck evaluation.  Follow-Up Instructions: Return in about 6 months (around 04/04/2019).   Orders:  No orders of the defined types were placed in this encounter.  No orders of the defined types were placed in this encounter.     Procedures: No procedures performed   Clinical Data: No additional findings.   Subjective: Chief Complaint  Patient presents with  . Right Knee - Follow-up    Right total knee arthroplasty 07/17/18    HPI: Patient presents today for a six week follow up on his right knee. He had a right total knee arthroplasty on 07/17/18. Doing well. He is doing home exercises. Not taking anything for pain.  Very happy with his results at this time.  Eyes any calf pain.   Review of Systems  Constitutional: Negative for fatigue and fever.  HENT: Negative for ear pain.   Eyes: Negative for pain.  Respiratory: Negative for cough and shortness of breath.   Cardiovascular: Positive for leg swelling.  Gastrointestinal: Negative for constipation and diarrhea.  Genitourinary: Negative for difficulty urinating.  Musculoskeletal: Positive for back pain. Negative for neck pain.  Skin: Negative for rash.  Allergic/Immunologic: Negative for food allergies.  Neurological: Positive for weakness. Negative for numbness.  Hematological: Does not bruise/bleed easily.  Psychiatric/Behavioral: Negative for sleep disturbance.     Objective: Vital Signs: BP 138/83   Pulse 77   Ht 5\' 6"  (1.676 m)   Wt 146 lb (66.2 kg)   BMI 23.57 kg/m   Physical  Exam Constitutional:      Appearance: He is well-developed.  Eyes:     Pupils: Pupils are equal, round, and reactive to light.  Pulmonary:     Effort: Pulmonary effort is normal.  Skin:    General: Skin is warm and dry.  Neurological:     Mental Status: He is alert and oriented to person, place, and time.  Psychiatric:        Behavior: Behavior normal.     Ortho Exam  Exam today reveals the wound to be healing per primam with no signs of infection.  Minimal scar noted.  No real effusion.  Range of motion from full extension to 120 degrees easily.  Smooth motion good ligament stability.  Calf supple nontender.   Specialty Comments:  No specialty comments available.  Imaging: No results found.   PMFS History: Current Outpatient Medications  Medication Sig Dispense Refill  . acetaminophen (TYLENOL) 325 MG tablet Take 2 tablets (650 mg total) by mouth every 6 (six) hours.    Marland Kitchen aspirin EC 325 MG EC tablet Take 1 tablet (325 mg total) by mouth daily with breakfast. 30 tablet 0  . Secukinumab, 300 MG Dose, (COSENTYX SENSOREADY, 300 MG,) 150 MG/ML SOAJ Inject 300 mg into the skin every 28 (twenty-eight) days. 6 pen 0  . methocarbamol (ROBAXIN) 500 MG tablet Take 1 tablet (500 mg  total) by mouth every 8 (eight) hours as needed for muscle spasms. (Patient not taking: Reported on 10/02/2018) 30 tablet 0  . oxyCODONE (OXY IR/ROXICODONE) 5 MG immediate release tablet Take 1-2 tablets (5-10 mg total) by mouth every 4 (four) hours as needed for moderate pain (pain score 4-6). (Patient not taking: Reported on 10/02/2018) 80 tablet 0  . triamcinolone ointment (KENALOG) 0.1 % Apply 1 application topically 2 (two) times daily as needed (itching).     No current facility-administered medications for this visit.     Patient Active Problem List   Diagnosis Date Noted  . History of total right knee replacement 07/17/2018  . Primary osteoarthritis of right knee 05/31/2018  . History of tuberculosis  11/05/2015  . Orthostatic hypotension 11/05/2015  . Constipation 11/05/2015  . Thrombocytopenia (HCC) 11/05/2015  . Normocytic anemia 11/05/2015  . Psoriatic arthritis (HCC) 11/03/2015  . Essential hypertension 11/03/2015  . Osteoarthritis of left knee 11/03/2015  . Status post total knee replacement, left 11/03/2015  . Pulmonary infiltrate 01/29/2013  . Psoriasis 01/23/2013  . Immunocompromised state (HCC) 01/23/2013   Past Medical History:  Diagnosis Date  . Acute bronchiolitis due to other infectious organisms 01/26/2013  . Arthritis    "knees" (11/03/2015)  . Extrapulmonary TB (tuberculosis) 02/02/2013  . High risk medication use 12/27/2016  . History of hiatal hernia 2007; 2009  . Hypertension   . Pleural effusion 01/26/2013    History reviewed. No pertinent family history.  Past Surgical History:  Procedure Laterality Date  . CATARACT EXTRACTION W/ INTRAOCULAR LENS  IMPLANT, BILATERAL Bilateral   . HERNIA REPAIR    . HIATAL HERNIA REPAIR  2007; 2009  . JOINT REPLACEMENT    . TOTAL KNEE ARTHROPLASTY Left 11/03/2015  . TOTAL KNEE ARTHROPLASTY Left 11/03/2015   Procedure: Left Total Knee Arthroplasty;  Surgeon: Valeria Batman, MD;  Location: [facility];  Service: Orthopedics;  Laterality: Left;  . TOTAL KNEE ARTHROPLASTY Right 07/17/2018   Procedure: RIGHT TOTAL KNEE ARTHROPLASTY;  Surgeon: Valeria Batman, MD;  Location: [facility] OR;  Service: Orthopedics;  Laterality: Right;  Marland Kitchen VIDEO BRONCHOSCOPY Bilateral 01/31/2013   Procedure: VIDEO BRONCHOSCOPY WITHOUT FLUORO;  Surgeon: Merwyn Katos, MD;  Location: [facility] ENDOSCOPY;  Service: Cardiopulmonary;  Laterality: Bilateral;   Social History   Occupational History  . Not on file  Tobacco Use  . Smoking status: Never Smoker  . Smokeless tobacco: Never Used  Substance and Sexual Activity  . Alcohol use: No  . Drug use: No  . Sexual activity: Not on file

## 2018-10-03 ENCOUNTER — Ambulatory Visit (INDEPENDENT_AMBULATORY_CARE_PROVIDER_SITE_OTHER): Payer: Medicare Other | Admitting: Orthopaedic Surgery

## 2018-10-05 DIAGNOSIS — M159 Polyosteoarthritis, unspecified: Secondary | ICD-10-CM | POA: Diagnosis not present

## 2018-10-05 DIAGNOSIS — I10 Essential (primary) hypertension: Secondary | ICD-10-CM | POA: Diagnosis not present

## 2018-10-08 DIAGNOSIS — L309 Dermatitis, unspecified: Secondary | ICD-10-CM | POA: Diagnosis not present

## 2018-10-08 DIAGNOSIS — L851 Acquired keratosis [keratoderma] palmaris et plantaris: Secondary | ICD-10-CM | POA: Diagnosis not present

## 2018-10-08 DIAGNOSIS — L821 Other seborrheic keratosis: Secondary | ICD-10-CM | POA: Diagnosis not present

## 2018-10-10 DIAGNOSIS — Z6823 Body mass index (BMI) 23.0-23.9, adult: Secondary | ICD-10-CM | POA: Diagnosis not present

## 2018-10-10 DIAGNOSIS — Z299 Encounter for prophylactic measures, unspecified: Secondary | ICD-10-CM | POA: Diagnosis not present

## 2018-10-10 DIAGNOSIS — R490 Dysphonia: Secondary | ICD-10-CM | POA: Diagnosis not present

## 2018-10-10 DIAGNOSIS — I10 Essential (primary) hypertension: Secondary | ICD-10-CM | POA: Diagnosis not present

## 2018-11-02 DIAGNOSIS — I10 Essential (primary) hypertension: Secondary | ICD-10-CM | POA: Diagnosis not present

## 2018-11-02 DIAGNOSIS — M159 Polyosteoarthritis, unspecified: Secondary | ICD-10-CM | POA: Diagnosis not present

## 2018-11-29 ENCOUNTER — Other Ambulatory Visit: Payer: Self-pay | Admitting: Rheumatology

## 2018-11-29 DIAGNOSIS — L408 Other psoriasis: Secondary | ICD-10-CM

## 2018-11-29 DIAGNOSIS — Z79899 Other long term (current) drug therapy: Secondary | ICD-10-CM

## 2018-11-29 NOTE — Telephone Encounter (Signed)
Last visit: 09/18/18 Next visit due July 2020. Message sent to front to schedule  Labs: 07/19/18 hemoglobin was 9.4 low calcium  Chest x-ray 07/04/18 no active disease   Patient advised he is due to update labs. Patient will update this week.   Okay to refill per Dr. Corliss Skains

## 2018-11-29 NOTE — Telephone Encounter (Signed)
Please schedule patient for a follow up visit. Patient due July 2020. Thanks! 

## 2018-12-03 NOTE — Telephone Encounter (Signed)
I LMOM for patient to call, and schedule next follow up appt with Dr. Corliss Skains in July. Patient also needs to rs appt with Dr. Cleophas Dunker on 04/05/2019 due to a schedule change.

## 2018-12-06 LAB — CBC WITH DIFFERENTIAL/PLATELET
Absolute Monocytes: 645 cells/uL (ref 200–950)
Basophils Absolute: 31 cells/uL (ref 0–200)
Basophils Relative: 0.6 %
Eosinophils Absolute: 140 cells/uL (ref 15–500)
Eosinophils Relative: 2.7 %
HCT: 41.5 % (ref 38.5–50.0)
Hemoglobin: 14 g/dL (ref 13.2–17.1)
Lymphs Abs: 1404 cells/uL (ref 850–3900)
MCH: 30.6 pg (ref 27.0–33.0)
MCHC: 33.7 g/dL (ref 32.0–36.0)
MCV: 90.8 fL (ref 80.0–100.0)
MPV: 11.3 fL (ref 7.5–12.5)
Monocytes Relative: 12.4 %
Neutro Abs: 2980 cells/uL (ref 1500–7800)
Neutrophils Relative %: 57.3 %
Platelets: 161 10*3/uL (ref 140–400)
RBC: 4.57 10*6/uL (ref 4.20–5.80)
RDW: 13 % (ref 11.0–15.0)
Total Lymphocyte: 27 %
WBC: 5.2 10*3/uL (ref 3.8–10.8)

## 2018-12-06 LAB — COMPLETE METABOLIC PANEL WITH GFR
AG Ratio: 1.3 (calc) (ref 1.0–2.5)
ALT: 14 U/L (ref 9–46)
AST: 25 U/L (ref 10–35)
Albumin: 4 g/dL (ref 3.6–5.1)
Alkaline phosphatase (APISO): 86 U/L (ref 35–144)
BUN: 16 mg/dL (ref 7–25)
CO2: 29 mmol/L (ref 20–32)
Calcium: 8.9 mg/dL (ref 8.6–10.3)
Chloride: 103 mmol/L (ref 98–110)
Creat: 1 mg/dL (ref 0.70–1.18)
GFR, Est African American: 86 mL/min/{1.73_m2} (ref >=60)
GFR, Est Non African American: 74 mL/min/{1.73_m2} (ref >=60)
Globulin: 3.1 g/dL (calc) (ref 1.9–3.7)
Glucose, Bld: 77 mg/dL (ref 65–99)
Potassium: 4.6 mmol/L (ref 3.5–5.3)
Sodium: 138 mmol/L (ref 135–146)
Total Bilirubin: 0.8 mg/dL (ref 0.2–1.2)
Total Protein: 7.1 g/dL (ref 6.1–8.1)

## 2018-12-07 NOTE — Telephone Encounter (Signed)
CBC and CMP WNL

## 2019-01-15 DIAGNOSIS — I10 Essential (primary) hypertension: Secondary | ICD-10-CM | POA: Diagnosis not present

## 2019-01-15 DIAGNOSIS — M159 Polyosteoarthritis, unspecified: Secondary | ICD-10-CM | POA: Diagnosis not present

## 2019-02-04 NOTE — Progress Notes (Signed)
Office Visit Note  Patient: Alan Roman             Date of Birth: [birth year]           MRN: [id number]             PCP: Glenda Chroman, MD Referring: Glenda Chroman, MD Visit Date: 02/15/2019 Occupation: @GUAROCC @  Subjective:  Medication monitoring      History of Present Illness: Alan Roman is a 73 y.o. male with history of psoriatic arthritis and psoriasis.  He states he is doing really well on Cosentyx.  He denies any side effects from the medication.  He has had no psoriasis flares.  He does not have any joint swelling.  He states he has some discomfort in his right knee joint which was replaced in December 2019.  His left knee replacement is doing well which was replaced in 2017.  Activities of Daily Living:  Patient reports morning stiffness for 0 none.   Patient Denies nocturnal pain.  Difficulty dressing/grooming: Denies Difficulty climbing stairs: Denies Difficulty getting out of chair: Denies Difficulty using hands for taps, buttons, cutlery, and/or writing: Denies  Review of Systems  Constitutional: Negative for fatigue and night sweats.  HENT: Negative for mouth sores, mouth dryness and nose dryness.   Eyes: Negative for redness and dryness.  Respiratory: Negative for shortness of breath and difficulty breathing.   Cardiovascular: Negative for chest pain, palpitations, hypertension, irregular heartbeat and swelling in legs/feet.  Gastrointestinal: Negative for constipation and diarrhea.  Endocrine: Negative for excessive thirst and increased urination.  Genitourinary: Negative for difficulty urinating.  Musculoskeletal: Negative for arthralgias, joint pain, joint swelling, myalgias, muscle weakness, morning stiffness, muscle tenderness and myalgias.  Skin: Negative for color change, rash, hair loss, nodules/bumps, skin tightness, ulcers and sensitivity to sunlight.  Allergic/Immunologic: Negative for susceptible to infections.  Neurological: Negative  for dizziness, fainting, memory loss, night sweats and weakness.  Hematological: Negative for bruising/bleeding tendency and swollen glands.  Psychiatric/Behavioral: Negative for depressed mood and sleep disturbance. The patient is not nervous/anxious.     PMFS History:  Patient Active Problem List   Diagnosis Date Noted  . History of total right knee replacement 07/17/2018  . Primary osteoarthritis of right knee 05/31/2018  . History of tuberculosis 11/05/2015  . Orthostatic hypotension 11/05/2015  . Constipation 11/05/2015  . Thrombocytopenia ([place]) 11/05/2015  . Normocytic anemia 11/05/2015  . Psoriatic arthritis ([place]) 11/03/2015  . Essential hypertension 11/03/2015  . Osteoarthritis of left knee 11/03/2015  . Status post total knee replacement, left 11/03/2015  . Pulmonary infiltrate 01/29/2013  . Psoriasis 01/23/2013  . Immunocompromised state ([place]) 01/23/2013    Past Medical History:  Diagnosis Date  . Acute bronchiolitis due to other infectious organisms 01/26/2013  . Arthritis    "knees" (11/03/2015)  . Extrapulmonary TB (tuberculosis) 02/02/2013  . High risk medication use 12/27/2016  . History of hiatal hernia 2007; 2009  . Hypertension   . Pleural effusion 01/26/2013    History reviewed. No pertinent family history. Past Surgical History:  Procedure Laterality Date  . CATARACT EXTRACTION W/ INTRAOCULAR LENS  IMPLANT, BILATERAL Bilateral   . HERNIA REPAIR    . HIATAL HERNIA REPAIR  2007; 2009  . JOINT REPLACEMENT    . TOTAL KNEE ARTHROPLASTY Left 11/03/2015  . TOTAL KNEE ARTHROPLASTY Left 11/03/2015   Procedure: Left Total Knee Arthroplasty;  Surgeon: Garald Balding, MD;  Location: [place];  Service: Orthopedics;  Laterality: Left;  . TOTAL  KNEE ARTHROPLASTY Right 07/17/2018   Procedure: RIGHT TOTAL KNEE ARTHROPLASTY;  Surgeon: Valeria BatmanWhitfield, Peter W, MD;  [facility];  Service: Orthopedics;  Laterality: Right;  Marland Kitchen. VIDEO BRONCHOSCOPY Bilateral 01/31/2013   Procedure: VIDEO  BRONCHOSCOPY WITHOUT FLUORO;  Surgeon: Merwyn Katosavid B Simonds, MD;  [facility];  Service: Cardiopulmonary;  Laterality: Bilateral;   Social History   Social History Narrative  . Not on file   Immunization History  Administered Date(s) Administered  . PPD Test 04/06/2010  . Tdap 05/30/2017     Objective: Vital Signs: BP 110/67 (BP Location: Left Arm, Patient Position: Sitting, Cuff Size: Normal)   Pulse 73   Resp 16   Ht 5' 7.5" (1.715 m)   Wt 158 lb 3.2 oz (71.8 kg)   BMI 24.41 kg/m    Physical Exam Vitals signs and nursing note reviewed.  Constitutional:      Appearance: He is well-developed.  HENT:     Head: Normocephalic and atraumatic.  Eyes:     Conjunctiva/sclera: Conjunctivae normal.     Pupils: Pupils are equal, round, and reactive to light.  Neck:     Musculoskeletal: Normal range of motion and neck supple.  Cardiovascular:     Rate and Rhythm: Normal rate and regular rhythm.     Heart sounds: Normal heart sounds.  Pulmonary:     Effort: Pulmonary effort is normal.     Breath sounds: Normal breath sounds.  Abdominal:     General: Bowel sounds are normal.     Palpations: Abdomen is soft.  Skin:    General: Skin is warm and dry.     Capillary Refill: Capillary refill takes less than 2 seconds.  Neurological:     Mental Status: He is alert and oriented to person, place, and time.  Psychiatric:        Behavior: Behavior normal.      Musculoskeletal Exam: C-spine thoracic and lumbar spine with good range of motion.  Shoulder joints, elbow joints, wrist joints, MCPs, PIPs and DIPs with good range of motion with no synovitis.  Hip joints, knee joints, ankles and MTPs with good range of motion.  His right knee joint is replaced which has some warmth.  Left knee joint is replaced without any warmth or swelling. CDAI Exam: CDAI Score: - Patient Global: -; Provider Global: - Swollen: -; Tender: - Joint Exam   No joint exam has been documented for this  visit   There is currently no information documented on the homunculus. Go to the Rheumatology activity and complete the homunculus joint exam.  Investigation: No additional findings.  Imaging: No results found.  Recent Labs: Lab Results  Component Value Date   WBC 5.2 12/06/2018   HGB 14.0 12/06/2018   PLT 161 12/06/2018   NA 138 12/06/2018   K 4.6 12/06/2018   CL 103 12/06/2018   CO2 29 12/06/2018   GLUCOSE 77 12/06/2018   BUN 16 12/06/2018   CREATININE 1.00 12/06/2018   BILITOT 0.8 12/06/2018   ALKPHOS 75 07/04/2018   AST 25 12/06/2018   ALT 14 12/06/2018   PROT 7.1 12/06/2018   ALBUMIN 3.8 07/04/2018   CALCIUM 8.9 12/06/2018   GFRAA 86 12/06/2018    Speciality Comments: No specialty comments available.  Procedures:  No procedures performed Allergies: Patient has no known allergies.   Assessment / Plan:     Visit Diagnoses: Psoriatic arthritis (HCC) -his arthritis is very well controlled.  He had no synovitis on examination today.  He is tolerating Cosentyx well.  Psoriasis -he had no active psoriasis today.  High risk medication use -  Cosentyx 300 mg every 28 days.  Previously treated for TB and last chest x-ray normal on 07/17/2018.  Most recent CBC/CMP within normal limits on 12/06/2018.  Due for CBC/CMP in August and will monitor every 3 months.  Standing orders are in place. Recommend annual influenza, Pneumovax 23, Prevnar 13, and Shingrix as indicated for immunosuppressant therapy.  Precautions during the pandemic were discussed.  Which will include using facemask, social distance again good and sanitizing.  If he does get infection he should discontinue medication and restart after he recovers from the infection.  He will be returning in August for the next blood work.  His chest x-ray will be repeated in December 2020.  Status post total knee replacement, right - Dr. Cleophas DunkerWhitfield. 07/17/2018 -he continues to have some warmth and stiffness.  Overall his mobility  is improved.  Status post total knee replacement, left - Dr. Cleophas DunkerWhitfield. 2017 -doing well.  Primary osteoarthritis of both feet -he is currently not having much discomfort.  Thrombocytopenia (HCC)-his last platelet count was normal.  Essential hypertension-blood pressure is well controlled.  Extrapulmonary TB (tuberculosis)-treated in the past  Normocytic anemia -his last hemoglobin level was normal.  Orders: No orders of the defined types were placed in this encounter.  No orders of the defined types were placed in this encounter.     Follow-Up Instructions: Return in about 5 months (around 07/18/2019) for Psoriatic arthritis.   Pollyann SavoyShaili Addelynn Batte, MD  Note - This record has been created using Animal nutritionistDragon software.  Chart creation errors have been sought, but may not always  have been located. Such creation errors do not reflect on  the standard of medical care.

## 2019-02-05 DIAGNOSIS — I10 Essential (primary) hypertension: Secondary | ICD-10-CM | POA: Diagnosis not present

## 2019-02-05 DIAGNOSIS — D899 Disorder involving the immune mechanism, unspecified: Secondary | ICD-10-CM | POA: Diagnosis not present

## 2019-02-05 DIAGNOSIS — Z713 Dietary counseling and surveillance: Secondary | ICD-10-CM | POA: Diagnosis not present

## 2019-02-05 DIAGNOSIS — Z299 Encounter for prophylactic measures, unspecified: Secondary | ICD-10-CM | POA: Diagnosis not present

## 2019-02-05 DIAGNOSIS — Z6823 Body mass index (BMI) 23.0-23.9, adult: Secondary | ICD-10-CM | POA: Diagnosis not present

## 2019-02-15 ENCOUNTER — Ambulatory Visit (INDEPENDENT_AMBULATORY_CARE_PROVIDER_SITE_OTHER): Payer: Medicare Other | Admitting: Rheumatology

## 2019-02-15 ENCOUNTER — Encounter: Payer: Self-pay | Admitting: Rheumatology

## 2019-02-15 ENCOUNTER — Other Ambulatory Visit: Payer: Self-pay

## 2019-02-15 VITALS — BP 110/67 | HR 73 | Resp 16 | Ht 67.5 in | Wt 158.2 lb

## 2019-02-15 DIAGNOSIS — M159 Polyosteoarthritis, unspecified: Secondary | ICD-10-CM | POA: Diagnosis not present

## 2019-02-15 DIAGNOSIS — I10 Essential (primary) hypertension: Secondary | ICD-10-CM | POA: Diagnosis not present

## 2019-02-15 DIAGNOSIS — L409 Psoriasis, unspecified: Secondary | ICD-10-CM | POA: Diagnosis not present

## 2019-02-15 DIAGNOSIS — M19072 Primary osteoarthritis, left ankle and foot: Secondary | ICD-10-CM | POA: Diagnosis not present

## 2019-02-15 DIAGNOSIS — Z96652 Presence of left artificial knee joint: Secondary | ICD-10-CM | POA: Diagnosis not present

## 2019-02-15 DIAGNOSIS — M19071 Primary osteoarthritis, right ankle and foot: Secondary | ICD-10-CM

## 2019-02-15 DIAGNOSIS — I2510 Atherosclerotic heart disease of native coronary artery without angina pectoris: Secondary | ICD-10-CM | POA: Diagnosis not present

## 2019-02-15 DIAGNOSIS — Z79899 Other long term (current) drug therapy: Secondary | ICD-10-CM | POA: Diagnosis not present

## 2019-02-15 DIAGNOSIS — L4050 Arthropathic psoriasis, unspecified: Secondary | ICD-10-CM

## 2019-02-15 DIAGNOSIS — Z96651 Presence of right artificial knee joint: Secondary | ICD-10-CM

## 2019-02-15 NOTE — Patient Instructions (Signed)
Standing Labs We placed an order today for your standing lab work.    Please come back and get your standing labs in August and every 3 months  We have open lab daily Monday through Thursday from 8:30-12:30 PM and 1:30-4:30 PM and Friday from 8:30-12:30 PM and 1:30 -4:00 PM at the office of Dr. Edmonia Gonser.   You may experience shorter wait times on Monday and Friday afternoons. The office is located at [address], [address], [place], [place] [address] No appointment is necessary.   Labs are drawn by [facility].  You may receive a bill from [facility] for your lab work.  If you wish to have your labs drawn at another location, please call the office 24 hours in advance to send orders.  If you have any questions regarding directions or hours of operation,  please call [phone].   Just as a reminder please drink plenty of water prior to coming for your lab work. Thanks!   

## 2019-04-03 ENCOUNTER — Ambulatory Visit (INDEPENDENT_AMBULATORY_CARE_PROVIDER_SITE_OTHER): Payer: Medicare Other | Admitting: Orthopaedic Surgery

## 2019-04-03 ENCOUNTER — Other Ambulatory Visit: Payer: Self-pay

## 2019-04-03 ENCOUNTER — Encounter: Payer: Self-pay | Admitting: Orthopaedic Surgery

## 2019-04-03 VITALS — BP 135/81 | HR 67 | Ht 67.5 in | Wt 148.0 lb

## 2019-04-03 DIAGNOSIS — Z96651 Presence of right artificial knee joint: Secondary | ICD-10-CM | POA: Diagnosis not present

## 2019-04-03 DIAGNOSIS — I2510 Atherosclerotic heart disease of native coronary artery without angina pectoris: Secondary | ICD-10-CM | POA: Diagnosis not present

## 2019-04-03 NOTE — Progress Notes (Signed)
Office Visit Note   Patient: Alan Roman           Date of Birth: 14-Apr-1946           MRN: [id number] Visit Date: 04/03/2019              Requested by: Ignatius SpeckingVyas, Dhruv B, MD [address] [place],  [place] [id number] PCP: Ignatius SpeckingVyas, Dhruv B, MD   Assessment & Plan: Visit Diagnoses:  1. History of total right knee replacement     Plan: 9 months status post right total knee replacement and doing well.  Walks 40 to 50 minutes every day without any real problems.  Occasionally has some aches or pains.  No fever or chills.  Doing very well and we will plan to see him back on a as needed basis.  Discussed need for antibiotics with any invasive procedure.  Continue with his knee strengthening exercises.  Has had prior left total knee replacement without any problems  Follow-Up Instructions: Return if symptoms worsen or fail to improve.   Orders:  No orders of the defined types were placed in this encounter.  No orders of the defined types were placed in this encounter.     Procedures: No procedures performed   Clinical Data: No additional findings.   Subjective: Chief Complaint  Patient presents with  . Right Knee - Follow-up    Right TKA DOS 07/17/18  Patient presents today for a follow up on his right knee. He had a right total knee arthroplasty on 07/17/18 and is now 8 months out from surgery. Doing well overall. He has some pain after walking 30-40 minutes. He has also noticed that his right ankle swells some.  Has history of psoriatic arthritis treated by Dr. Dr. Corliss Skainseveshwar.  HPI  Review of Systems   Objective: Vital Signs: BP 135/81   Pulse 67   Ht 5' 7.5" (1.715 m)   Wt 148 lb (67.1 kg)   BMI 22.84 kg/m   Physical Exam Constitutional:      Appearance: He is well-developed.  Eyes:     Pupils: Pupils are equal, round, and reactive to light.  Pulmonary:     Effort: Pulmonary effort is normal.  Skin:    General: Skin is warm and dry.  Neurological:     Mental  Status: He is alert and oriented to person, place, and time.  Psychiatric:        Behavior: Behavior normal.     Ortho Exam awake alert and oriented x3.  Comfortable sitting in no acute distress.  Walks without a limp.  Some hypertrophic changes about the right knee compared to the left but minimal.  Full quick extension both knees and flexed over 120 degrees.  No instability.  No effusion of the left knee.  No effusion on the right.  Has some mild nonpitting swelling of his right ankle compared to the left.  Some venous stasis changes right more than left ankle motor exam intact.  Walks without ambulatory aid  Specialty Comments:  No specialty comments available.  Imaging: No results found.   PMFS History: Patient Active Problem List   Diagnosis Date Noted  . History of total right knee replacement 07/17/2018  . Primary osteoarthritis of right knee 05/31/2018  . History of tuberculosis 11/05/2015  . Orthostatic hypotension 11/05/2015  . Constipation 11/05/2015  . Thrombocytopenia (HCC) 11/05/2015  . Normocytic anemia 11/05/2015  . Psoriatic arthritis (HCC) 11/03/2015  . Essential hypertension 11/03/2015  . Osteoarthritis  of left knee 11/03/2015  . Status post total knee replacement, left 11/03/2015  . Pulmonary infiltrate 01/29/2013  . Psoriasis 01/23/2013  . Immunocompromised state ([place]) 01/23/2013   Past Medical History:  Diagnosis Date  . Acute bronchiolitis due to other infectious organisms 01/26/2013  . Arthritis    "knees" (11/03/2015)  . Extrapulmonary TB (tuberculosis) 02/02/2013  . High risk medication use 12/27/2016  . History of hiatal hernia 2007; 2009  . Hypertension   . Pleural effusion 01/26/2013    History reviewed. No pertinent family history.  Past Surgical History:  Procedure Laterality Date  . CATARACT EXTRACTION W/ INTRAOCULAR LENS  IMPLANT, BILATERAL Bilateral   . HERNIA REPAIR    . HIATAL HERNIA REPAIR  2007; 2009  . JOINT REPLACEMENT    . TOTAL  KNEE ARTHROPLASTY Left 11/03/2015  . TOTAL KNEE ARTHROPLASTY Left 11/03/2015   Procedure: Left Total Knee Arthroplasty;  Surgeon: Garald Balding, MD;  Location: [facility] [place];  Service: Orthopedics;  Laterality: Left;  . TOTAL KNEE ARTHROPLASTY Right 07/17/2018   Procedure: RIGHT TOTAL KNEE ARTHROPLASTY;  Surgeon: Garald Balding, MD;  Location: [facility];  Service: Orthopedics;  Laterality: Right;  Marland Kitchen VIDEO BRONCHOSCOPY Bilateral 01/31/2013   Procedure: VIDEO BRONCHOSCOPY WITHOUT FLUORO;  Surgeon: Wilhelmina Mcardle, MD;  Location: [facility] ENDOSCOPY;  Service: Cardiopulmonary;  Laterality: Bilateral;   Social History   Occupational History  . Not on file  Tobacco Use  . Smoking status: Never Smoker  . Smokeless tobacco: Never Used  Substance and Sexual Activity  . Alcohol use: No  . Drug use: No  . Sexual activity: Not on file

## 2019-04-05 ENCOUNTER — Ambulatory Visit: Payer: Self-pay | Admitting: Orthopaedic Surgery

## 2019-04-17 ENCOUNTER — Ambulatory Visit: Payer: Medicare Other | Admitting: Orthopaedic Surgery

## 2019-05-06 ENCOUNTER — Ambulatory Visit (INDEPENDENT_AMBULATORY_CARE_PROVIDER_SITE_OTHER): Payer: Medicare Other | Admitting: Pulmonary Disease

## 2019-05-06 ENCOUNTER — Encounter: Payer: Self-pay | Admitting: Pulmonary Disease

## 2019-05-06 ENCOUNTER — Other Ambulatory Visit: Payer: Self-pay

## 2019-05-06 DIAGNOSIS — I2510 Atherosclerotic heart disease of native coronary artery without angina pectoris: Secondary | ICD-10-CM

## 2019-05-06 DIAGNOSIS — R0600 Dyspnea, unspecified: Secondary | ICD-10-CM

## 2019-05-06 DIAGNOSIS — J383 Other diseases of vocal cords: Secondary | ICD-10-CM

## 2019-05-06 DIAGNOSIS — J849 Interstitial pulmonary disease, unspecified: Secondary | ICD-10-CM

## 2019-05-06 DIAGNOSIS — R0609 Other forms of dyspnea: Secondary | ICD-10-CM

## 2019-05-06 NOTE — Assessment & Plan Note (Signed)
He underwent gel injection of vocal cords in 2016 but has had persistent dysphonia. Possible that all the symptoms are related to vocal cord issues and would need ENT reevaluation

## 2019-05-06 NOTE — Assessment & Plan Note (Addendum)
Given bibasal crackles, need to rule out ILD. 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  Other possibility is that this is all related to vocal cord issues that he has in the past  Proceed with high-resolution CT chest  If this shows evidence of scarring then we will obtain lung function test.

## 2019-05-06 NOTE — Patient Instructions (Signed)
Proceed with high-resolution CT chest  If this shows evidence of scarring then we will obtain lung function test. If CT is negative, then would suggest ENT evaluation for vocal cords as cause of symptoms

## 2019-05-06 NOTE — Progress Notes (Signed)
Subjective:    Patient ID: Alan Roman, male    DOB: [birth year], 73 y.o.   MRN: [id number]  HPI  Chief Complaint  Patient presents with  . Consult    Shortness of breath for last 3 months, worse in the morning   73 year old never smoker of [place] origin presents for evaluation of hoarseness, dry cough and congestion. He is originally from [place], [place], [place] to the [place] in [place] for his bachelors to [place] and then settled in [place] in 1978 and got into the [employer]. He has been undergoing treatment for psoriatic arthritis by Dr. Metta Clines, was originally on Humira and is now on Cosentyx. He had latent TB that was untreated initially and in 12/2012 he presented with a mild alveolitis on CT scan and pleural effusion that required thoracentesis, bronchoscopy was negative for AFB but he was treated under the guidance of local health department and ID with multiple drug antituberculous treatment for 3 months.  His problem lists extrapulmonary TB but on my review all AFB cultures were negative.  Review of his other imaging shows- CT angiogram 10/2015 -which shows mild reticular densities suggestive but not diagnostic of ILD. 07/2018 chest x-ray shows eventration of right hemidiaphragm.  He was diagnosed with vocal cord paralysis in 04/2015 and underwent ENT evaluation for persistent dysphonia and underwent gel injection of the vocal cords at [facility] without any relief  For the past 3 months he reports dry cough and congestion especially in the mornings which improves with drinking warm water.  He also reports mild shortness of breath but at the same time reports that he can walk for 30 minutes and the house without significant dyspnea. He underwent right TKR in 07/2018 and denies any issues with VTE in the past  He denies fevers, sick contacts or sputum production There is no weight loss. He now leads a retired lifestyle and lives with his wife, his daughter  works for [employer] in [place] and his son-in-law is a Pharmacist, community   On walking 3 laps around the office, he did not desaturate  Past Medical History:  Diagnosis Date  . Acute bronchiolitis due to other infectious organisms 01/26/2013  . Arthritis    "knees" (11/03/2015)  . Extrapulmonary TB (tuberculosis) 02/02/2013  . High risk medication use 12/27/2016  . History of hiatal hernia 2007; 2009  . Hypertension   . Pleural effusion 01/26/2013    Past Surgical History:  Procedure Laterality Date  . CATARACT EXTRACTION W/ INTRAOCULAR LENS  IMPLANT, BILATERAL Bilateral   . HERNIA REPAIR    . HIATAL HERNIA REPAIR  2007; 2009  . JOINT REPLACEMENT    . TOTAL KNEE ARTHROPLASTY Left 11/03/2015  . TOTAL KNEE ARTHROPLASTY Left 11/03/2015   Procedure: Left Total Knee Arthroplasty;  Surgeon: Garald Balding, MD;  Location: [place];  Service: Orthopedics;  Laterality: Left;  . TOTAL KNEE ARTHROPLASTY Right 07/17/2018   Procedure: RIGHT TOTAL KNEE ARTHROPLASTY;  Surgeon: Garald Balding, MD;  Location: [place];  Service: Orthopedics;  Laterality: Right;  Marland Kitchen VIDEO BRONCHOSCOPY Bilateral 01/31/2013   Procedure: VIDEO BRONCHOSCOPY WITHOUT FLUORO;  Surgeon: Wilhelmina Mcardle, MD;  [facility] END[facility]COPY;  Service: Cardiopulmonary;  Laterality: Bilateral;    No Known Allergies   Social History   Socioeconomic History  . Marital status: Married    Spouse name: Not on file  . Number of children: Not on file  . Years of education: Not on file  . Highest education level:  Not on file  Occupational History  . Not on file  Social Needs  . Financial resource strain: Not on file  . Food insecurity    Worry: Not on file    Inability: Not on file  . Transportation needs    Medical: Not on file    Non-medical: Not on file  Tobacco Use  . Smoking status: Never Smoker  . Smokeless tobacco: Never Used  Substance and Sexual Activity  . Alcohol use: No  . Drug use: No  . Sexual activity: Not on file  Lifestyle  .  Physical activity    Days per week: Not on file    Minutes per session: Not on file  . Stress: Not on file  Relationships  . Social Musician on phone: Not on file    Gets together: Not on file    Attends religious service: Not on file    Active member of club or organization: Not on file    Attends meetings of clubs or organizations: Not on file    Relationship status: Not on file  . Intimate partner violence    Fear of current or ex partner: Not on file    Emotionally abused: Not on file    Physically abused: Not on file    Forced sexual activity: Not on file  Other Topics Concern  . Not on file  Social History Narrative  . Not on file     No family history on file.   Review of Systems   Constitutional: negative for anorexia, fevers and sweats  Eyes: negative for irritation, redness and visual disturbance  Ears, nose, mouth, throat, and face: negative for earaches, epistaxis, nasal congestion and sore throat positive for hoarseness Respiratory: negative for sputum and wheezing positive for dry cough and congestion Cardiovascular: negative for chest pain,  lower extremity edema, orthopnea, palpitations and syncope  Gastrointestinal: negative for abdominal pain, constipation, diarrhea, melena, nausea and vomiting  Genitourinary:negative for dysuria, frequency and hematuria  Hematologic/lymphatic: negative for bleeding, easy bruising and lymphadenopathy  Musculoskeletal:negative for, muscle weakness  Neurological: negative for coordination problems, gait problems, headaches and weakness  Endocrine: negative for diabetic symptoms including polydipsia, polyuria and weight loss     Objective:   Physical Exam  Gen. Pleasant, well-nourished, in no distress, normal affect ENT - no pallor,icterus, no post nasal drip Neck: No JVD, no thyromegaly, no carotid bruits Lungs: no use of accessory muscles, no dullness to percussion, BIBASAL FINE DRY  rales no rhonchi   Cardiovascular: Rhythm regular, heart sounds  normal, no murmurs or gallops, no peripheral edema Abdomen: soft and non-tender, no hepatosplenomegaly, BS normal. Musculoskeletal: No deformities, no cyanosis or clubbing Neuro:  alert, non focal       Assessment & Plan:

## 2019-05-07 ENCOUNTER — Telehealth: Payer: Self-pay | Admitting: Pulmonary Disease

## 2019-05-07 NOTE — Telephone Encounter (Signed)
Spoke with the pt's daughter She wanted to go over the recs from her father's ov with Dr Elsworth Soho  I went over AVS with her  She is aware pt will be contacted with the CT date and time  Nothing further needed  Instructions    Return in about 1 month (around 06/06/2019). Proceed with high-resolution CT chest   If this shows evidence of scarring then we will obtain lung function test. If CT is negative, then would suggest ENT evaluation for vocal cords as cause of symptoms

## 2019-05-12 DIAGNOSIS — Z23 Encounter for immunization: Secondary | ICD-10-CM | POA: Diagnosis not present

## 2019-05-23 ENCOUNTER — Telehealth: Payer: Self-pay | Admitting: Pulmonary Disease

## 2019-05-23 NOTE — Telephone Encounter (Signed)
Osvaldo Shipper, NT  P [facility] Triage Pool  Cc: McMichael, Orland Mustard, Cameron Proud, Zachery Conch, Doristine Devoid, Mil[place] J        Please put in new order and reschedule patient at another facility. We are unable to do CT because of insurance. Pt appt was 10/29.   Pt needs CT appt before 11/2   Pt is aware appt was canceled.   Thank you   Erline Levine

## 2019-05-23 NOTE — Telephone Encounter (Signed)
New order placed

## 2019-05-23 NOTE — Addendum Note (Signed)
Addended by: Amado Coe on: 05/23/2019 05:07 PM   Modules accepted: Orders

## 2019-05-30 ENCOUNTER — Inpatient Hospital Stay: Admission: RE | Admit: 2019-05-30 | Payer: Medicare Other | Source: Ambulatory Visit

## 2019-05-31 ENCOUNTER — Other Ambulatory Visit: Payer: Self-pay

## 2019-05-31 ENCOUNTER — Ambulatory Visit (HOSPITAL_COMMUNITY)
Admission: RE | Admit: 2019-05-31 | Discharge: 2019-05-31 | Disposition: A | Discharge status: Home / Self Care | Payer: Medicare Other | Source: Ambulatory Visit | Attending: Pulmonary Disease | Admitting: Pulmonary Disease

## 2019-05-31 ENCOUNTER — Ambulatory Visit (HOSPITAL_COMMUNITY): Payer: Medicare Other

## 2019-05-31 DIAGNOSIS — J849 Interstitial pulmonary disease, unspecified: Secondary | ICD-10-CM | POA: Diagnosis not present

## 2019-05-31 DIAGNOSIS — J8410 Pulmonary fibrosis, unspecified: Secondary | ICD-10-CM | POA: Diagnosis not present

## 2019-06-03 ENCOUNTER — Encounter: Payer: Self-pay | Admitting: Pulmonary Disease

## 2019-06-03 ENCOUNTER — Telehealth: Payer: Self-pay | Admitting: Pulmonary Disease

## 2019-06-03 ENCOUNTER — Other Ambulatory Visit: Payer: Self-pay

## 2019-06-03 ENCOUNTER — Ambulatory Visit (INDEPENDENT_AMBULATORY_CARE_PROVIDER_SITE_OTHER): Payer: Medicare Other | Admitting: Pulmonary Disease

## 2019-06-03 DIAGNOSIS — J986 Disorders of diaphragm: Secondary | ICD-10-CM | POA: Insufficient documentation

## 2019-06-03 DIAGNOSIS — J383 Other diseases of vocal cords: Secondary | ICD-10-CM | POA: Diagnosis not present

## 2019-06-03 DIAGNOSIS — I2510 Atherosclerotic heart disease of native coronary artery without angina pectoris: Secondary | ICD-10-CM | POA: Diagnosis not present

## 2019-06-03 DIAGNOSIS — J849 Interstitial pulmonary disease, unspecified: Secondary | ICD-10-CM | POA: Diagnosis not present

## 2019-06-03 LAB — SEDIMENTATION RATE: Sed Rate: 22 mm/hr — ABNORMAL HIGH (ref 0–20)

## 2019-06-03 NOTE — Assessment & Plan Note (Signed)
ENT referral for vocal cord paralysis Rule out aspiration by esophagram with speech therapist present

## 2019-06-03 NOTE — Telephone Encounter (Signed)
Pt needs covid test prior to pft at [facility] and I also have scheduled a DG Esophagus for him.  I have left vm for him to call me back.

## 2019-06-03 NOTE — Addendum Note (Signed)
Addended by: Suzzanne Cloud E on: 06/03/2019 10:14 AM   Modules accepted: Orders

## 2019-06-03 NOTE — Addendum Note (Signed)
Addended by: Suzzanne Cloud E on: 06/03/2019 10:36 AM   Modules accepted: Orders

## 2019-06-03 NOTE — Assessment & Plan Note (Signed)
Probable UIP pattern, worse compared to 2017 Differential diagnosis includes aspiration insult due to vocal cord issues  Esophagram/swallow study with therapist present. Blood work today. Schedule PFTs to establish baseline.  Based on this we will consider antifibrotic therapy

## 2019-06-03 NOTE — Addendum Note (Signed)
Addended by: Suzzanne Cloud E on: 06/03/2019 10:51 AM   Modules accepted: Orders

## 2019-06-03 NOTE — Addendum Note (Signed)
Addended by: Suzzanne Cloud E on: 06/03/2019 10:42 AM   Modules accepted: Orders

## 2019-06-03 NOTE — Patient Instructions (Signed)
You have scar tissue in your lungs-pulmonary fibrosis-slight worse compared to 2017  Esophagram/swallow study with therapist present. Blood work today. Schedule PFTs  ENT referral for vocal cord paralysis

## 2019-06-03 NOTE — Progress Notes (Signed)
   Subjective:    Patient ID: Alan Roman, male    DOB: 05-17-1946, 73 y.o.   MRN: [id number]  HPI   73 year old never smoker of [place] origin initial consult 05/2019  for evaluation of hoarseness, dry cough and congestion. He is originally from [place], [place], [place] to the [place] in [place] for his bachelors to [place] and then settled in [place] in 1978 and got into the [employer].  PMH - - psoriatic arthritis by Dr. Metta Clines, was originally on Humira and is now on Cosentyx. - latent TB that was untreated initially and in 12/2012 he presented with a mild alveolitis on CT scan and pleural effusion that required thoracentesis, bronchoscopy was negative for AFB but he was treated under the guidance of local health department and ID with multiple drug antituberculous treatment for 3 months.  His problem lists extrapulmonary TB but on my review all AFB cultures were negative.   -07/2018 chest x-ray shows eventration of right hemidiaphragm.  - vocal cord paralysis in 04/2015 and underwent ENT evaluation for persistent dysphonia and underwent gel injection of the vocal cords at [facility] without any relief  Chief Complaint  Patient presents with  . Follow-up    1 month rov- review CT chest 10/31.  pt states his s/s are unchanged since last month, prod cough with clear mucus qam that subsides throughout the day.      Symptoms are persistent. Main issues are hoarseness, dry cough and congestion  We reviewed HRCT in detail today  Significant tests/ events reviewed  CT angiogram 10/2015 -which shows mild reticular densities suggestive but not diagnostic of ILD.  HRCT 05/31/19 mild, bibasilar predominant pulmonary fibrosis in a pattern featuring subtle subpleural reticular interstitial opacity, mild traction bronchiectasis and occasional areas of Bronchiolectasis - ' probable UIP ' - slight worse  Review of Systems neg for any significant sore throat, dysphagia,  itching, sneezing, nasal congestion or excess/ purulent secretions, fever, chills, sweats, unintended wt loss, pleuritic or exertional cp, hempoptysis, orthopnea pnd or change in chronic leg swelling. Also denies presyncope, palpitations, heartburn, abdominal pain, nausea, vomiting, diarrhea or change in bowel or urinary habits, dysuria,hematuria, rash, arthralgias, visual complaints, headache, numbness weakness or ataxia.     Objective:   Physical Exam   Gen. Pleasant, well-nourished, in no distress, normal affect ENT - no pallor,icterus, no post nasal drip Neck: No JVD, no thyromegaly, no carotid bruits Lungs: no use of accessory muscles, no dullness to percussion, bibasal rales no rhonchi  Cardiovascular: Rhythm regular, heart sounds  normal, no murmurs or gallops, no peripheral edema Abdomen: soft and non-tender, no hepatosplenomegaly, BS normal. Musculoskeletal: No deformities, no cyanosis or clubbing Neuro:  alert, non focal        Assessment & Plan:

## 2019-06-03 NOTE — Assessment & Plan Note (Signed)
Probably longstanding dates back at least to 2017 Doubt this is contributing

## 2019-06-03 NOTE — Addendum Note (Signed)
Addended by: Len Blalock on: 06/03/2019 10:41 AM   Modules accepted: Orders

## 2019-06-04 LAB — ANTI-JO 1 ANTIBODY, IGG: Anti JO-1: <0.2 AI (ref 0.0–0.9)

## 2019-06-04 NOTE — Telephone Encounter (Signed)
Spoke to pt & gave him appt info for DG esophagus and covid test.  Nothing further needed.

## 2019-06-06 ENCOUNTER — Ambulatory Visit (HOSPITAL_COMMUNITY)
Admission: RE | Admit: 2019-06-06 | Discharge: 2019-06-06 | Disposition: A | Discharge status: Home / Self Care | Payer: Medicare Other | Source: Ambulatory Visit | Attending: Pulmonary Disease | Admitting: Pulmonary Disease

## 2019-06-06 ENCOUNTER — Other Ambulatory Visit: Payer: Self-pay | Admitting: General Surgery

## 2019-06-06 ENCOUNTER — Other Ambulatory Visit (HOSPITAL_COMMUNITY): Payer: Self-pay | Admitting: Pulmonary Disease

## 2019-06-06 ENCOUNTER — Inpatient Hospital Stay (HOSPITAL_COMMUNITY): Admission: RE | Admit: 2019-06-06 | Payer: Medicare Other | Source: Ambulatory Visit

## 2019-06-06 ENCOUNTER — Other Ambulatory Visit: Payer: Self-pay | Admitting: Rheumatology

## 2019-06-06 ENCOUNTER — Other Ambulatory Visit: Payer: Self-pay | Admitting: Pulmonary Disease

## 2019-06-06 ENCOUNTER — Other Ambulatory Visit: Payer: Self-pay

## 2019-06-06 DIAGNOSIS — R05 Cough: Secondary | ICD-10-CM | POA: Diagnosis not present

## 2019-06-06 DIAGNOSIS — J986 Disorders of diaphragm: Secondary | ICD-10-CM

## 2019-06-06 DIAGNOSIS — L408 Other psoriasis: Secondary | ICD-10-CM

## 2019-06-06 DIAGNOSIS — J383 Other diseases of vocal cords: Secondary | ICD-10-CM

## 2019-06-06 DIAGNOSIS — R1310 Dysphagia, unspecified: Secondary | ICD-10-CM | POA: Diagnosis not present

## 2019-06-06 NOTE — Progress Notes (Signed)
Objective Swallowing Evaluation: Type of Study: Bedside Swallow Evaluation   Patient Details  Name: Alan Roman MRN: [id number] Date of Birth: 09-Jun-1946  Today's Date: 06/06/2019 Time: SLP Start Time (ACUTE ONLY): 1140 -SLP Stop Time (ACUTE ONLY): 1205  SLP Time Calculation (min) (ACUTE ONLY): 25 min   Past Medical History:  Past Medical History:  Diagnosis Date  . Acute bronchiolitis due to other infectious organisms 01/26/2013  . Arthritis    "knees" (11/03/2015)  . Extrapulmonary TB (tuberculosis) 02/02/2013  . High risk medication use 12/27/2016  . History of hiatal hernia 2007; 2009  . Hypertension   . Pleural effusion 01/26/2013   Past Surgical History:  Past Surgical History:  Procedure Laterality Date  . CATARACT EXTRACTION W/ INTRAOCULAR LENS  IMPLANT, BILATERAL Bilateral   . HERNIA REPAIR    . HIATAL HERNIA REPAIR  2007; 2009  . JOINT REPLACEMENT    . TOTAL KNEE ARTHROPLASTY Left 11/03/2015  . TOTAL KNEE ARTHROPLASTY Left 11/03/2015   Procedure: Left Total Knee Arthroplasty;  Surgeon: Garald Balding, MD;  Location: [place];  Service: Orthopedics;  Laterality: Left;  . TOTAL KNEE ARTHROPLASTY Right 07/17/2018   Procedure: RIGHT TOTAL KNEE ARTHROPLASTY;  Surgeon: Garald Balding, MD;  Location: [facility];  Service: Orthopedics;  Laterality: Right;  Marland Kitchen VIDEO BRONCHOSCOPY Bilateral 01/31/2013   Procedure: VIDEO BRONCHOSCOPY WITHOUT FLUORO;  Surgeon: Wilhelmina Mcardle, MD;  Location: [facility] ENDOSCOPY;  Service: Cardiopulmonary;  Laterality: Bilateral;   HPI: [name], 73y/m PMH of intersticial lung disease referred by Pulmonologist for outpatient Modified Barium Evaluation.  Pt has known vocal cord dysfunction, however no prior swallowing difficulties.    Subjective: alert and cooperative    Assessment / Plan / Recommendation  CHL IP CLINICAL IMPRESSIONS 06/06/2019  Clinical Impression [name], 73y/m, present for outpatient Modified Barium Study. Pt  reports choking on solids occassionally for the last several weeks. On several occassions when choking it takes someone "hitting his back" to dislogde food. He reports no difficulties with liquids and medication whole with liquids.  Patient reports he is vegetarian. Most vegetable are cooked to a softened consistency. Barium was as a thin liquid, puree,  on cracker and in pill form given with thin liquid barium. Mastication, oral manipulation and bolus hold and transfer were remarkable. A slight delay in swallow only noticed with pill/liquid, but then only to the valleculae. No penetration or aspiraiton noted with any consistencie. No oral or pharyngeal residue noted. Esophageal sweep noted complete clearance. Radiologist noted results of pill in the lower esophagus. Recommendation from Speech therapy are diet modifications of soft foods, chewed or mechanically altered to a ground consistency, alternate liquids and solids to aid in clearing bolus as well as eating small bites/sips in an upright 90 degree position. No follow up with speech therapy at this time.   SLP Visit Diagnosis Dysphagia, unspecified (R13.10)  Attention and concentration deficit following --  Frontal lobe and executive function deficit following --  Impact on safety and function Mild aspiration risk      CHL IP TREATMENT RECOMMENDATION 06/06/2019  Treatment Recommendations No treatment recommended at this time     No flowsheet data found.  CHL IP DIET RECOMMENDATION 06/06/2019  SLP Diet Recommendations Dysphagia 3 (Mech soft) solids;Thin liquid  Liquid Administration via Cup  Medication Administration Whole meds with liquid  Compensations Slow rate;Small sips/bites;Follow solids with liquid  Postural Changes Seated upright at 90 degrees      CHL IP OTHER RECOMMENDATIONS  06/06/2019  Recommended Consults Consider GI evaluation  Oral Care Recommendations Oral care BID  Other Recommendations --      CHL IP FOLLOW UP  RECOMMENDATIONS 01/30/2013  Follow up Recommendations None      No flowsheet data found.         CHL IP ORAL PHASE 06/06/2019  Oral Phase WFL  Oral - Pudding Teaspoon --  Oral - Pudding Cup --  Oral - Honey Teaspoon --  Oral - Honey Cup --  Oral - Nectar Teaspoon --  Oral - Nectar Cup --  Oral - Nectar Straw --  Oral - Thin Teaspoon --  Oral - Thin Cup --  Oral - Thin Straw --  Oral - Puree --  Oral - Mech Soft --  Oral - Regular --  Oral - Multi-Consistency --  Oral - Pill --  Oral Phase - Comment --    CHL IP PHARYNGEAL PHASE 06/06/2019  Pharyngeal Phase WFL  Pharyngeal- Pudding Teaspoon --  Pharyngeal --  Pharyngeal- Pudding Cup --  Pharyngeal --  Pharyngeal- Honey Teaspoon --  Pharyngeal --  Pharyngeal- Honey Cup --  Pharyngeal --  Pharyngeal- Nectar Teaspoon --  Pharyngeal --  Pharyngeal- Nectar Cup --  Pharyngeal --  Pharyngeal- Nectar Straw --  Pharyngeal --  Pharyngeal- Thin Teaspoon --  Pharyngeal --  Pharyngeal- Thin Cup --  Pharyngeal --  Pharyngeal- Thin Straw --  Pharyngeal --  Pharyngeal- Puree --  Pharyngeal --  Pharyngeal- Mechanical Soft --  Pharyngeal --  Pharyngeal- Regular --  Pharyngeal --  Pharyngeal- Multi-consistency --  Pharyngeal --  Pharyngeal- Pill --  Pharyngeal --  Pharyngeal Comment --     CHL IP CERVICAL ESOPHAGEAL PHASE 06/06/2019  Cervical Esophageal Phase WFL  Pudding Teaspoon --  Pudding Cup --  Honey Teaspoon --  Honey Cup --  Nectar Teaspoon --  Nectar Cup --  Nectar Straw --  Thin Teaspoon --  Thin Cup --  Thin Straw --  Puree --  Mechanical Soft --  Regular --  Multi-consistency --  Pill --  Cervical Esophageal Comment --     Luis Abed, MA, CCC-SLP 06/06/2019, 1:26 PM

## 2019-06-06 NOTE — Telephone Encounter (Signed)
Need labs prior to refill.

## 2019-06-06 NOTE — Telephone Encounter (Addendum)
Last Visit: 02/15/19  Next Visit: 07/19/19 Labs: 12/06/18 WNL Chest Xray: 07/17/18  Left message to advise patient he is due to update labs.   Okay to refill 30 day supply Cosentyx?

## 2019-06-07 ENCOUNTER — Other Ambulatory Visit: Payer: Self-pay

## 2019-06-07 ENCOUNTER — Ambulatory Visit (HOSPITAL_COMMUNITY)
Admission: RE | Admit: 2019-06-07 | Discharge: 2019-06-07 | Disposition: A | Discharge status: Home / Self Care | Payer: Medicare Other | Source: Ambulatory Visit | Attending: Pulmonary Disease | Admitting: Pulmonary Disease

## 2019-06-07 DIAGNOSIS — Z79899 Other long term (current) drug therapy: Secondary | ICD-10-CM

## 2019-06-07 DIAGNOSIS — J383 Other diseases of vocal cords: Secondary | ICD-10-CM | POA: Diagnosis not present

## 2019-06-07 DIAGNOSIS — J986 Disorders of diaphragm: Secondary | ICD-10-CM | POA: Diagnosis not present

## 2019-06-07 DIAGNOSIS — K222 Esophageal obstruction: Secondary | ICD-10-CM | POA: Diagnosis not present

## 2019-06-08 LAB — COMPLETE METABOLIC PANEL WITH GFR
AG Ratio: 1.3 (calc) (ref 1.0–2.5)
ALT: 15 U/L (ref 9–46)
AST: 25 U/L (ref 10–35)
Albumin: 4.1 g/dL (ref 3.6–5.1)
Alkaline phosphatase (APISO): 95 U/L (ref 35–144)
BUN: 14 mg/dL (ref 7–25)
CO2: 25 mmol/L (ref 20–32)
Calcium: 8.6 mg/dL (ref 8.6–10.3)
Chloride: 105 mmol/L (ref 98–110)
Creat: 1 mg/dL (ref 0.70–1.18)
GFR, Est African American: 86 mL/min/{1.73_m2} (ref >=60)
GFR, Est Non African American: 74 mL/min/{1.73_m2} (ref >=60)
Globulin: 3.1 g/dL (calc) (ref 1.9–3.7)
Glucose, Bld: 88 mg/dL (ref 65–99)
Potassium: 4.4 mmol/L (ref 3.5–5.3)
Sodium: 136 mmol/L (ref 135–146)
Total Bilirubin: 0.6 mg/dL (ref 0.2–1.2)
Total Protein: 7.2 g/dL (ref 6.1–8.1)

## 2019-06-08 LAB — CBC WITH DIFFERENTIAL/PLATELET
Absolute Monocytes: 581 cells/uL (ref 200–950)
Basophils Absolute: 20 cells/uL (ref 0–200)
Basophils Relative: 0.4 %
Eosinophils Absolute: 122 cells/uL (ref 15–500)
Eosinophils Relative: 2.4 %
HCT: 42.5 % (ref 38.5–50.0)
Hemoglobin: 14.2 g/dL (ref 13.2–17.1)
Lymphs Abs: 1525 cells/uL (ref 850–3900)
MCH: 31.6 pg (ref 27.0–33.0)
MCHC: 33.4 g/dL (ref 32.0–36.0)
MCV: 94.4 fL (ref 80.0–100.0)
MPV: 12.1 fL (ref 7.5–12.5)
Monocytes Relative: 11.4 %
Neutro Abs: 2851 cells/uL (ref 1500–7800)
Neutrophils Relative %: 55.9 %
Platelets: 163 10*3/uL (ref 140–400)
RBC: 4.5 10*6/uL (ref 4.20–5.80)
RDW: 12.1 % (ref 11.0–15.0)
Total Lymphocyte: 29.9 %
WBC: 5.1 10*3/uL (ref 3.8–10.8)

## 2019-06-08 LAB — ANTI-NUCLEAR AB-TITER (ANA TITER): ANA Titer 1: 1:40 {titer} — ABNORMAL HIGH

## 2019-06-08 LAB — HYPERSENSITIVITY PNUEMONITIS PROFILE
ASPERGILLUS FUMIGATUS: NEGATIVE
Faenia retivirgula: NEGATIVE
Pigeon Serum: NEGATIVE
S. VIRIDIS: NEGATIVE
T. CANDIDUS: NEGATIVE
T. VULGARIS: NEGATIVE

## 2019-06-08 LAB — RHEUMATOID FACTOR: Rheumatoid fact SerPl-aCnc: 14 IU/mL — ABNORMAL HIGH (ref <14)

## 2019-06-08 LAB — SJOGRENS SYNDROME-A EXTRACTABLE NUCLEAR ANTIBODY: SSA (Ro) (ENA) Antibody, IgG: <1 AI

## 2019-06-08 LAB — CENTROMERE ANTIBODIES: Centromere Ab Screen: <1 AI

## 2019-06-08 LAB — SJOGRENS SYNDROME-B EXTRACTABLE NUCLEAR ANTIBODY: SSB (La) (ENA) Antibody, IgG: <1 AI

## 2019-06-08 LAB — ANTI-SCLERODERMA ANTIBODY: Scleroderma (Scl-70) (ENA) Antibody, IgG: <1 AI

## 2019-06-08 LAB — ANA: Anti Nuclear Antibody (ANA): POSITIVE — AB

## 2019-06-08 LAB — ALDOLASE: Aldolase: 4.9 U/L (ref <=8.1)

## 2019-06-10 NOTE — Progress Notes (Signed)
CBC and CMP WNL

## 2019-06-24 DIAGNOSIS — R5383 Other fatigue: Secondary | ICD-10-CM | POA: Diagnosis not present

## 2019-06-24 DIAGNOSIS — E7800 Pure hypercholesterolemia, unspecified: Secondary | ICD-10-CM | POA: Diagnosis not present

## 2019-06-24 DIAGNOSIS — I10 Essential (primary) hypertension: Secondary | ICD-10-CM | POA: Diagnosis not present

## 2019-06-24 DIAGNOSIS — Z125 Encounter for screening for malignant neoplasm of prostate: Secondary | ICD-10-CM | POA: Diagnosis not present

## 2019-06-24 DIAGNOSIS — Z79899 Other long term (current) drug therapy: Secondary | ICD-10-CM | POA: Diagnosis not present

## 2019-06-24 DIAGNOSIS — Z1339 Encounter for screening examination for other mental health and behavioral disorders: Secondary | ICD-10-CM | POA: Diagnosis not present

## 2019-06-24 DIAGNOSIS — Z6824 Body mass index (BMI) 24.0-24.9, adult: Secondary | ICD-10-CM | POA: Diagnosis not present

## 2019-06-24 DIAGNOSIS — Z299 Encounter for prophylactic measures, unspecified: Secondary | ICD-10-CM | POA: Diagnosis not present

## 2019-06-24 DIAGNOSIS — Z1211 Encounter for screening for malignant neoplasm of colon: Secondary | ICD-10-CM | POA: Diagnosis not present

## 2019-06-24 DIAGNOSIS — Z7189 Other specified counseling: Secondary | ICD-10-CM | POA: Diagnosis not present

## 2019-06-24 DIAGNOSIS — Z0000 Encounter for general adult medical examination without abnormal findings: Secondary | ICD-10-CM | POA: Diagnosis not present

## 2019-06-24 DIAGNOSIS — D849 Immunodeficiency, unspecified: Secondary | ICD-10-CM | POA: Diagnosis not present

## 2019-06-24 DIAGNOSIS — Z1331 Encounter for screening for depression: Secondary | ICD-10-CM | POA: Diagnosis not present

## 2019-06-25 ENCOUNTER — Telehealth: Payer: Self-pay

## 2019-06-25 DIAGNOSIS — L408 Other psoriasis: Secondary | ICD-10-CM

## 2019-06-25 MED ORDER — COSENTYX SENSOREADY (300 MG) 150 MG/ML ~~LOC~~ SOAJ: 300.0000 mg | SUBCUTANEOUS | 0 refills | Status: DC

## 2019-06-25 NOTE — Telephone Encounter (Signed)
Patient presented in the office for a sample of cosentyx and a refill.  Last Visit: 02/15/2019 Next Visit: 07/19/2019 Labs: 06/07/2019 CBC and CMP WNL.  Chest xray: 07/17/2018 No active cardiopulmonary disease.    Medication Samples have been provided to the patient.  Drug name: cosentyx    Strength: 300mg        Qty: 1 box  LOT: [id number]  Exp.Date: 03/2020  Dosing instructions: inject 300mg  into the skin every 28 days.   The patient has been instructed regarding the correct time, dose, and frequency of taking this medication, including desired effects and most common side effects.   Kameria Canizares C Ja Pistole 2:06 PM 06/25/2019

## 2019-06-26 NOTE — Progress Notes (Signed)
LMTCB

## 2019-07-02 DIAGNOSIS — K228 Other specified diseases of esophagus: Secondary | ICD-10-CM | POA: Diagnosis not present

## 2019-07-02 DIAGNOSIS — H9313 Tinnitus, bilateral: Secondary | ICD-10-CM | POA: Diagnosis not present

## 2019-07-02 DIAGNOSIS — J383 Other diseases of vocal cords: Secondary | ICD-10-CM | POA: Diagnosis not present

## 2019-07-05 ENCOUNTER — Inpatient Hospital Stay (HOSPITAL_COMMUNITY)
Admission: RE | Admit: 2019-07-05 | Discharge: 2019-07-05 | Disposition: A | Discharge status: Home / Self Care | Payer: Medicare Other | Source: Ambulatory Visit | Attending: Pulmonary Disease | Admitting: Pulmonary Disease

## 2019-07-05 NOTE — Progress Notes (Signed)
Patient scheduled for OV with Dr. Elsworth Soho on 07/08/19.

## 2019-07-05 NOTE — Progress Notes (Signed)
Patient has appointment to be seen by Dr. Elsworth Soho on 07/08/19. Referral recommendation can be discussed at that time.

## 2019-07-06 NOTE — Progress Notes (Signed)
Pateint called back, said she can't come for covid test today.  I instructed her to call Dr's office to let them know.  They may want to reschedule.

## 2019-07-06 NOTE — Progress Notes (Signed)
Patient did not show for the Covid test on 12/.  Procedure is scheduled for 12/7.  Tried to call patient to get him to come for Covid test today.  Left voicemail to call back

## 2019-07-08 ENCOUNTER — Ambulatory Visit: Payer: Medicare Other | Admitting: Pulmonary Disease

## 2019-07-08 ENCOUNTER — Inpatient Hospital Stay (HOSPITAL_COMMUNITY): Admission: RE | Admit: 2019-07-08 | Payer: Medicare Other | Source: Ambulatory Visit

## 2019-07-16 ENCOUNTER — Telehealth: Payer: Self-pay | Admitting: Pulmonary Disease

## 2019-07-16 ENCOUNTER — Other Ambulatory Visit (HOSPITAL_COMMUNITY)
Admission: RE | Admit: 2019-07-16 | Discharge: 2019-07-16 | Disposition: A | Discharge status: Home / Self Care | Payer: Medicare Other | Source: Ambulatory Visit | Attending: Pulmonary Disease | Admitting: Pulmonary Disease

## 2019-07-16 DIAGNOSIS — Z01812 Encounter for preprocedural laboratory examination: Secondary | ICD-10-CM | POA: Insufficient documentation

## 2019-07-16 DIAGNOSIS — Z20828 Contact with and (suspected) exposure to other viral communicable diseases: Secondary | ICD-10-CM | POA: Diagnosis not present

## 2019-07-16 NOTE — Telephone Encounter (Signed)
lmtcb for pt. Pt does need an OV after PFT.  RA is not in office that day but TP has an appt available at 3:00 (right after PFT).  Please offer/schedule pt when he returns call.

## 2019-07-17 LAB — NOVEL CORONAVIRUS, NAA (HOSP ORDER, SEND-OUT TO REF LAB; TAT 18-24 HRS): SARS-CoV-2, NAA: NOT DETECTED

## 2019-07-18 NOTE — Telephone Encounter (Signed)
LMTCB x2 for pt 

## 2019-07-19 ENCOUNTER — Ambulatory Visit: Payer: Medicare Other | Admitting: Rheumatology

## 2019-07-19 ENCOUNTER — Other Ambulatory Visit: Payer: Self-pay

## 2019-07-19 ENCOUNTER — Ambulatory Visit (HOSPITAL_COMMUNITY)
Admission: RE | Admit: 2019-07-19 | Discharge: 2019-07-19 | Disposition: A | Discharge status: Home / Self Care | Payer: Medicare Other | Source: Ambulatory Visit | Attending: Pulmonary Disease | Admitting: Pulmonary Disease

## 2019-07-19 DIAGNOSIS — J849 Interstitial pulmonary disease, unspecified: Secondary | ICD-10-CM | POA: Insufficient documentation

## 2019-07-19 LAB — PULMONARY FUNCTION TEST
DL/VA % pred: 90 %
DL/VA: 4.09 ml/min/mmHg/L
DLCO unc % pred: 43 %
DLCO unc: 13.59 ml/min/mmHg
FEF 25-75 Post: 3.05 L/sec
FEF 25-75 Pre: 2.78 L/sec
FEF2575-%Change-Post: 9 %
FEF2575-%Pred-Post: 145 %
FEF2575-%Pred-Pre: 132 %
FEV1-%Change-Post: 0 %
FEV1-%Pred-Post: 69 %
FEV1-%Pred-Pre: 69 %
FEV1-Post: 1.93 L
FEV1-Pre: 1.94 L
FEV1FVC-%Change-Post: 12 %
FEV1FVC-%Pred-Pre: 108 %
FEV6-%Change-Post: -10 %
FEV6-Post: 2.05 L
FEV6-Pre: 2.28 L
FEV6FVC-%Change-Post: 1 %
FVC-%Change-Post: -11 %
FVC-%Pred-Post: 56 %
FVC-%Pred-Pre: 63 %
FVC-Post: 2.06 L
FVC-Pre: 2.32 L
Post FEV1/FVC ratio: 93 %
Post FEV6/FVC ratio: 100 %
Pre FEV1/FVC ratio: 83 %
Pre FEV6/FVC Ratio: 99 %
RV % pred: 60 %
RV: 1.5 L
TLC % pred: 55 %
TLC: 3.8 L

## 2019-07-19 MED ORDER — ALBUTEROL SULFATE (2.5 MG/3ML) 0.083% IN NEBU
2.5000 mg | INHALATION_SOLUTION | Freq: Once | RESPIRATORY_TRACT | Status: AC
  Administered 2019-07-19: 14:00:00 2.5 mg via RESPIRATORY_TRACT

## 2019-07-19 NOTE — Telephone Encounter (Signed)
LMTCB x3 for pt. We have attempted to contact pt several times with no success or call back from pt. Per triage protocol, message will be closed.   

## 2019-08-05 ENCOUNTER — Telehealth: Payer: Self-pay | Admitting: Pharmacy Technician

## 2019-08-05 NOTE — Telephone Encounter (Signed)
Submitted a Prior Authorization request to [employer] for COSENTYX via Cover My Meds. Will update once we receive a response.   

## 2019-08-05 NOTE — Telephone Encounter (Signed)
Received notification from [facility] [employer] regarding a prior authorization for COSENTYX. Authorization has been APPROVED from 08/05/19 to 08/04/20.   Will send document to scan center.

## 2019-08-06 ENCOUNTER — Encounter: Payer: Self-pay | Admitting: Rheumatology

## 2019-08-06 NOTE — Progress Notes (Signed)
@Patient  ID: , male    DOB: 1945-12-01, 74 y.o.   MRN: [id number]  Chief Complaint  Patient presents with  . Follow-up    F/U after PFT on 12/18. States his breathing has been ok since last visit. His voice is starting to get better.     Referring provider: 1/19, MD  HPI:  74 year old male never smoker initially consulted with our office in October/2020 for evaluation of hoarseness, dry cough, congestion.  October/2020 high-resolution CT chest showing probable UIP  PMH: Psoriatic arthritis (Dr. November/2020 - prev on Humira, now on Cosentyx), latent TB that was untreated initially in 12/2012 with mild alveolitis on CT scan and pleural effusion that required thoracentesis, bronchoscopy was negative for AFB but he was treated under guidance with local health department infectious disease with multidrug antituberculosis treatment for 3 months, eventration of right hemidiaphragm, vocal cord paralysis in September/2016 underwent ENT evaluation for persistent dysphonia and underwent gel injection of vocal cords at [facility] without any relief Smoker/ Smoking History: Never smoker Maintenance: None Pt of: Patient of [facility]. 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   08/07/2019  - Visit   74 year old male never smoker presenting to our office today after completing pulmonary function testing.  Patient was last seen in our office in October/2020 for initial consult with Dr. November/2020.  At that office visit patient was scheduled for pulmonary function testing, lab work was completed to further evaluate patient's probable UIP on October/2020 high-resolution CT chest, patient was referred to ear nose and throat, swallow studies were also ordered on the patient.  Patient completed ENT referral in 07/02/2019 with Dr. 14/08/2018 Recommendations for voice  therapy, that he follow-up with gastroenterology regarding this Schatzki's ring.  Follow-up in April.  Proceed forward with pulmonary pulmonary function test.  Patient reports that he is received a telephone call to restart voice rehab but he has not done this yet as he wanted to review pulmonary function testing with our office first.  Patient swallow studies are listed below:  06/06/2019-swallow study-mild aspiration risk, no treatment recommended at this time 06/07/2019-barium swallow-Schatzki ring at the GE junction which prevents passage of a 13 mm barium tablet  Patient has a gastroenterologist Dr. 13/12/2018 but he has not seen her in over a year.  Patient has no scheduled upcoming appointments.  Patient continues to follow-up with rheumatology, Dr. Loreta Ave.  Per chart review last office note was in July/2020.  Patient presenting today to review December/2020 pulmonary function test.  Those results are listed below:  07/19/2019-pulmonary function test-FVC 2.32 (63% predicted), postbronchodilator ratio 93, postbronchodilator FEV1 1.93 (69% predicted), DLCO 13.59 (43% predicted) >>>Severe restriction, moderately severe diffusion defect  Lab work from November/2020 are also listed below:  06/03/2019-ANA-1: 40, nuclear homogenous 06/03/2019-rheumatoid factor-14 06/03/2019-anti-Jo 1-less than 0.2 06/03/2019-aldolase-4.9 06/03/2019-centromere antibodies-negative 06/03/2019-antiscleroderma antibody-less than 1 - 06/03/2019-Sjogren's syndrome 8 and B-negative 06/03/2019-hypersensitivity pneumonitis-negative 06/03/2019-sed rate 22  Mildly elevated ANA and rheumatoid factor.  Felt to be not clinically significant.  Rest of results are negative.  Patient continues to report that he is asymptomatic.  Patient tolerates walk in office without any oxygen desaturations.  He does continue to have occasional bouts of congestion which is believed to be related to the Cosentyx.  Family has messaged rheumatology  regarding this.  Tests:   07/19/2019-pulmonary function test-FVC 2.32 (63%  predicted), postbronchodilator ratio 93, postbronchodilator FEV1 1.93 (69% predicted), DLCO 13.59 (43% predicted) >>>Severe restriction, moderately severe diffusion defect  07/16/2019-SARS-CoV-2-not detected  06/03/2019-ANA-1: 40, nuclear homogenous 06/03/2019-rheumatoid factor-14 06/03/2019-anti-Jo 1-less than 0.2 06/03/2019-aldolase-4.9 06/03/2019-centromere antibodies-negative 06/03/2019-antiscleroderma antibody-less than 1 - 06/03/2019-Sjogren's syndrome 8 and B-negative 06/03/2019-hypersensitivity pneumonitis-negative 06/03/2019-sed rate 22  06/01/2019-CT chest high-res mild, bibasilar predominant pulmonary fibrosis in a pattern with featuring subtle subpleural reticular interstitial opacity and mild traction bronchiectasis occasional areas of bronchiolectasis, probable UIP  06/06/2019-swallow study-mild aspiration risk, no treatment recommended at this time  06/07/2019-barium swallow-Schatzki ring at the GE junction which prevents passage of a 13 mm barium tablet  FENO:  No results found for: NITRICOXIDE  PFT: PFT Results Latest Ref Rng & Units 07/19/2019  FVC-Pre L 2.32  FVC-Predicted Pre % 63  FVC-Post L 2.06  FVC-Predicted Post % 56  Pre FEV1/FVC % % 83  Post FEV1/FCV % % 93  FEV1-Pre L 1.94  FEV1-Predicted Pre % 69  FEV1-Post L 1.93  DLCO UNC% % 43  DLCO COR %Predicted % 90  TLC L 3.80  TLC % Predicted % 55  RV % Predicted % 60    WALK:  SIX MIN WALK 08/07/2019 05/06/2019  Supplimental Oxygen during Test? (L/min) No No  Tech Comments: Patient was able to walk 3 laps at a moderate pace. Denied any chest pain or SOB during walk. No O2 was needed during or after walk. patient walked moderate pace , no drop is sats, patient tolerated very well    Imaging: No results found.  Lab Results:  CBC    Component Value Date/Time   WBC 5.1 06/07/2019 1426   RBC 4.50 06/07/2019 1426   HGB 14.2  06/07/2019 1426   HCT 42.5 06/07/2019 1426   PLT 163 06/07/2019 1426   MCV 94.4 06/07/2019 1426   MCH 31.6 06/07/2019 1426   MCHC 33.4 06/07/2019 1426   RDW 12.1 06/07/2019 1426   LYMPHSABS 1,525 06/07/2019 1426   MONOABS 0.7 07/04/2018 1521   EOSABS 122 06/07/2019 1426   BASOSABS 20 06/07/2019 1426    BMET    Component Value Date/Time   NA 136 06/07/2019 1426   K 4.4 06/07/2019 1426   CL 105 06/07/2019 1426   CO2 25 06/07/2019 1426   GLUCOSE 88 06/07/2019 1426   BUN 14 06/07/2019 1426   CREATININE 1.00 06/07/2019 1426   CALCIUM 8.6 06/07/2019 1426   GFRNONAA 74 06/07/2019 1426   GFRAA 86 06/07/2019 1426    BNP No results found for: BNP  ProBNP No results found for: PROBNP  Specialty Problems      Pulmonary Problems   Dyspnea on exertion   Vocal cord atrophy   Elevated diaphragm    Eventration on right      ILD (interstitial lung disease) (HCC)    Probable UIP  06/03/2019-ANA-1: 40, nuclear homogenous 06/03/2019-rheumatoid factor-14 06/03/2019-anti-Jo 1-less than 0.2 06/03/2019-aldolase-4.9 06/03/2019-centromere antibodies-negative 06/03/2019-antiscleroderma antibody-less than 1 - 06/03/2019-Sjogren's syndrome 8 and B-negative 06/03/2019-hypersensitivity pneumonitis-negative 06/03/2019-sed rate 22  06/01/2019-CT chest high-res mild, bibasilar predominant pulmonary fibrosis in a pattern with featuring subtle subpleural reticular interstitial opacity and mild traction bronchiectasis occasional areas of bronchiolectasis, probable UIP  07/19/2019-pulmonary function test-FVC 2.32 (63% predicted), postbronchodilator ratio 93, postbronchodilator FEV1 1.93 (69% predicted), DLCO 13.59 (43% predicted) >>>Severe restriction, moderately severe diffusion defect         No Known Allergies  Immunization History  Administered Date(s) Administered  . Influenza, High Dose Seasonal PF 04/22/2019  . PPD Test 04/06/2010  .  Tdap 05/30/2017    Past Medical History:   Diagnosis Date  . Acute bronchiolitis due to other infectious organisms 01/26/2013  . Arthritis    "knees" (11/03/2015)  . Extrapulmonary TB (tuberculosis) 02/02/2013  . High risk medication use 12/27/2016  . History of hiatal hernia 2007; 2009  . Hypertension   . Pleural effusion 01/26/2013    Tobacco History: Social History   Tobacco Use  Smoking Status Never Smoker  Smokeless Tobacco Never Used   Counseling given: Yes   Continue to not smoke  Outpatient Encounter Medications as of 08/07/2019  Medication Sig  . multivitamin-iron-minerals-folic acid (CENTRUM) chewable tablet Chew 1 tablet by mouth every 3 (three) days.  . Secukinumab, 300 MG Dose, (COSENTYX SENSOREADY, 300 MG,) 150 MG/ML SOAJ Inject 300 mg into the skin every 28 (twenty-eight) days.   No facility-administered encounter medications on file as of 08/07/2019.   Review of Systems  Review of Systems  Constitutional: Negative for activity change, chills, fatigue, fever and unexpected weight change.  HENT: Positive for congestion. Negative for postnasal drip, rhinorrhea, sinus pressure, sinus pain and sore throat.   Eyes: Negative.   Respiratory: Negative for cough, shortness of breath and wheezing.   Cardiovascular: Negative for chest pain and palpitations.  Gastrointestinal: Negative for constipation, diarrhea, nausea and vomiting.  Endocrine: Negative.   Genitourinary: Negative.   Musculoskeletal: Negative.   Skin: Negative.   Neurological: Negative for dizziness and headaches.  Psychiatric/Behavioral: Negative.  Negative for dysphoric mood. The patient is not nervous/anxious.   All other systems reviewed and are negative.    Physical Exam  BP 110/64 (BP Location: Left Arm, Patient Position: Sitting, Cuff Size: Normal)   Pulse 80   Temp 98.1 F (36.7 C) (Temporal)   Ht 5' 7.5" (1.715 m)   Wt 159 lb 9.6 oz (72.4 kg)   SpO2 98% Comment: RA  BMI 24.63 kg/m   Wt Readings from Last 5 Encounters:  08/07/19  159 lb 9.6 oz (72.4 kg)  06/03/19 159 lb (72.1 kg)  05/06/19 159 lb 12.8 oz (72.5 kg)  04/03/19 148 lb (67.1 kg)  02/15/19 158 lb 3.2 oz (71.8 kg)    BMI Readings from Last 5 Encounters:  08/07/19 24.63 kg/m  06/03/19 24.54 kg/m  05/06/19 24.66 kg/m  04/03/19 22.84 kg/m  02/15/19 24.41 kg/m     Physical Exam Vitals and nursing note reviewed.  Constitutional:      General: He is not in acute distress.    Appearance: Normal appearance. He is normal weight.  HENT:     Head: Normocephalic and atraumatic.     Right Ear: Hearing and external ear normal. There is impacted cerumen.     Left Ear: Hearing, tympanic membrane, ear canal and external ear normal.     Nose: Nose normal. No mucosal edema or rhinorrhea.     Right Turbinates: Not enlarged.     Left Turbinates: Not enlarged.     Mouth/Throat:     Mouth: Mucous membranes are dry.     Pharynx: Oropharynx is clear. No oropharyngeal exudate.  Eyes:     Pupils: Pupils are equal, round, and reactive to light.  Cardiovascular:     Rate and Rhythm: Normal rate and regular rhythm.     Pulses: Normal pulses.     Heart sounds: Normal heart sounds. No murmur.  Pulmonary:     Effort: Pulmonary effort is normal.     Breath sounds: Rales (Left basilar crackles) present. No decreased breath sounds  or wheezing.     Comments: Few scattered squeaks Abdominal:     General: Abdomen is flat. Bowel sounds are normal. There is no distension.     Palpations: Abdomen is soft.     Tenderness: There is no abdominal tenderness.  Musculoskeletal:     Cervical back: Normal range of motion.     Right lower leg: No edema.     Left lower leg: No edema.  Lymphadenopathy:     Cervical: No cervical adenopathy.  Skin:    General: Skin is warm and dry.     Capillary Refill: Capillary refill takes less than 2 seconds.     Findings: No erythema or rash.  Neurological:     General: No focal deficit present.     Mental Status: He is alert and  oriented to person, place, and time.     Motor: No weakness.     Coordination: Coordination normal.     Gait: Gait is intact. Gait (Tolerated walk in office) normal.  Psychiatric:        Mood and Affect: Mood normal.        Behavior: Behavior normal. Behavior is cooperative.        Thought Content: Thought content normal.        Judgment: Judgment normal.       Assessment & Plan:   ILD (interstitial lung disease) ([place]) Discussion: At patient's request I contacted patient's daughter curdy.  I have reviewed recent results, lab work, pulmonary function test, high-resolution CT chest, ENT note, swallow studies with her.  My recommendation today would be the patient could consider starting antifibrotic therapies.  Patient encouraged he will further discuss the steps.  At this point in time they would prefer to conservatively manage and monitor with a pulmonary function test in 6 months: May/2021.  Patient and daughter will review treatment therapies of antifibrotic's as well as monitor for clinical symptoms worsening.  They will notify our office sooner if they would like to start treatment or if they would like to discuss this further.  Plan: Walk today in office Spirometry with DLCO in May/2021 with follow-up 5 month follow-up with Dr. Elsworth Soho with Arlyce Harman DLCO   Elevated diaphragm Plan: We will continue to monitor this clinically  Vocal cord atrophy Plan: Continue to follow-up with ear nose and throat Recommending starting voice rehab  Psoriatic arthritis [facility]) Plan: Continue follow-up with rheumatology  At risk for aspiration Plan: Referral to establish with a new gastroenterologist, family request     Return in about 4 months (around 12/05/2019), or if symptoms worsen or fail to improve, for Follow up with Dr. Elsworth Soho, Follow up for PFT.   Lauraine Rinne, NP 08/07/2019   This appointment required 45 minutes of patient care (this includes precharting, chart review, review of  results, face-to-face care, etc.).

## 2019-08-07 ENCOUNTER — Other Ambulatory Visit: Payer: Self-pay

## 2019-08-07 ENCOUNTER — Ambulatory Visit: Payer: Medicare Other | Admitting: Pulmonary Disease

## 2019-08-07 ENCOUNTER — Ambulatory Visit (INDEPENDENT_AMBULATORY_CARE_PROVIDER_SITE_OTHER): Payer: Medicare Other | Admitting: Pulmonary Disease

## 2019-08-07 ENCOUNTER — Encounter: Payer: Self-pay | Admitting: Pulmonary Disease

## 2019-08-07 VITALS — BP 110/64 | HR 80 | Temp 98.1°F | Ht 67.5 in | Wt 159.6 lb

## 2019-08-07 DIAGNOSIS — J849 Interstitial pulmonary disease, unspecified: Secondary | ICD-10-CM

## 2019-08-07 DIAGNOSIS — L4050 Arthropathic psoriasis, unspecified: Secondary | ICD-10-CM | POA: Diagnosis not present

## 2019-08-07 DIAGNOSIS — J986 Disorders of diaphragm: Secondary | ICD-10-CM

## 2019-08-07 DIAGNOSIS — Z9189 Other specified personal risk factors, not elsewhere classified: Secondary | ICD-10-CM

## 2019-08-07 DIAGNOSIS — J383 Other diseases of vocal cords: Secondary | ICD-10-CM

## 2019-08-07 NOTE — Assessment & Plan Note (Addendum)
Plan: Referral to establish with a new gastroenterologist, family request

## 2019-08-07 NOTE — Assessment & Plan Note (Signed)
Plan: Continue to follow-up with ear nose and throat Recommending starting voice rehab

## 2019-08-07 NOTE — Assessment & Plan Note (Addendum)
Discussion: At patient's request I contacted patient's daughter curdy.  I have reviewed recent results, lab work, pulmonary function test, high-resolution CT chest, ENT note, swallow studies with her.  My recommendation today would be the patient could consider starting antifibrotic therapies.  Patient encouraged he will further discuss the steps.  At this point in time they would prefer to conservatively manage and monitor with a pulmonary function test in 6 months: May/2021.  Patient and daughter will review treatment therapies of antifibrotic's as well as monitor for clinical symptoms worsening.  They will notify our office sooner if they would like to start treatment or if they would like to discuss this further.  Plan: Walk today in office Spirometry with DLCO in May/2021 with follow-up 5 month follow-up with Dr. [place] with Cleda Daub DLCO

## 2019-08-07 NOTE — Patient Instructions (Addendum)
You were seen today by Coral Ceo, NP  for:   1. ILD (interstitial lung disease) (HCC) 2. Elevated diaphragm  Repeat breathing test in may /2021  3. Vocal cord atrophy  Continue to follow up with ENT   Start voice rehab   4. Psoriatic arthritis (HCC)  Continue to follow up with Rheumatology   Discuss recurrent bronchitis symptoms with Dr. Corliss Skains   5. At risk for aspiration   Referral to GI    Follow Up:    Return in about 4 months (around 12/05/2019), or if symptoms worsen or fail to improve, for Follow up with Dr. [place].   Please do your part to reduce the spread of COVID-19:      Reduce your risk of any infection  and COVID19 by using the similar precautions used for avoiding the common cold or flu:  Marland Kitchen Wash your hands often with soap and warm water for at least 20 seconds.  If soap and water are not readily available, use an alcohol-based hand sanitizer with at least 60% alcohol.  . If coughing or sneezing, cover your mouth and nose by coughing or sneezing into the elbow areas of your shirt or coat, into a tissue or into your sleeve (not your hands). Drinda Butts A MASK when in public  . Avoid shaking hands with others and consider head nods or verbal greetings only. . Avoid touching your eyes, nose, or mouth with unwashed hands.  . Avoid close contact with people who are sick. . Avoid places or events with large numbers of people in one location, like concerts or sporting events. . If you have some symptoms but not all symptoms, continue to monitor at home and seek medical attention if your symptoms worsen. . If you are having a medical emergency, call 911.   ADDITIONAL HEALTHCARE OPTIONS FOR PATIENTS  [facility] / e-Visit: [email or web address]         [facility]: [phone]  [facility]: [phone]                   [facility]: [phone]     It is flu season:    >>> Best ways to protect herself from the flu: Receive the yearly flu vaccine, practice good hand hygiene washing with soap and also using hand sanitizer when available, eat a nutritious meals, get adequate rest, hydrate appropriately   Please contact the office if your symptoms worsen or you have concerns that you are not improving.   Thank you for choosing [facility] for your healthcare, and for allowing [place] to partner with you on your healthcare journey. I am thankful to be able to provide care to you today.   [name] FNP-C

## 2019-08-07 NOTE — Telephone Encounter (Signed)
From pt

## 2019-08-07 NOTE — Telephone Encounter (Signed)
Maralyn Sago, can you please advise on pt email in Dr Reginia Naas absence, thanks!   *-*-*This message has not been handled.*-*-*  I hope you are well. In regards to the upcoming COVID vaccine I have a few questions I am hoping you can answer.  1. With the medications I am on for my psoriasis will there be any interaction if I do get the vaccine (especially considering my history of being immuno-compromised). 2. Is there anything I need to be considered about when deciding if I should be getting the vaccine? 3. Will there be any problems health wise if I do get the vaccine since I do have a weakened immune system?  thank you  [name] Linam

## 2019-08-07 NOTE — Assessment & Plan Note (Signed)
Plan: We will continue to monitor this clinically. 

## 2019-08-07 NOTE — Assessment & Plan Note (Signed)
Plan: Continue follow-up with rheumatology 

## 2019-09-06 ENCOUNTER — Ambulatory Visit (INDEPENDENT_AMBULATORY_CARE_PROVIDER_SITE_OTHER): Payer: Medicare Other | Admitting: Cardiology

## 2019-09-06 ENCOUNTER — Encounter: Payer: Self-pay | Admitting: Cardiology

## 2019-09-06 ENCOUNTER — Other Ambulatory Visit: Payer: Self-pay

## 2019-09-06 VITALS — BP 163/90 | HR 75 | Temp 97.8°F | Ht 67.5 in | Wt 159.4 lb

## 2019-09-06 DIAGNOSIS — J849 Interstitial pulmonary disease, unspecified: Secondary | ICD-10-CM | POA: Diagnosis not present

## 2019-09-06 DIAGNOSIS — I2510 Atherosclerotic heart disease of native coronary artery without angina pectoris: Secondary | ICD-10-CM | POA: Diagnosis not present

## 2019-09-06 DIAGNOSIS — I10 Essential (primary) hypertension: Secondary | ICD-10-CM

## 2019-09-06 DIAGNOSIS — E785 Hyperlipidemia, unspecified: Secondary | ICD-10-CM

## 2019-09-06 MED ORDER — LOSARTAN POTASSIUM 25 MG PO TABS: 25.0000 mg | ORAL_TABLET | Freq: Every day | ORAL | 3 refills | Status: DC

## 2019-09-06 NOTE — Progress Notes (Signed)
Primary Physician/Referring:  Glenda Chroman, MD  Patient ID: Alan Roman, male    DOB: [birth year], 74 y.o.   MRN: [id number]  Chief Complaint  Patient presents with  . Hypertension  . Follow-up    2yr   HPI:    Alan Roman  is a 74 y.o. Asian [place] male with history of hypertension, psoriatic arthritis and follows Dr. Bo Merino, very mild hyperlipidemia and coronary calcificaiton by CT chest, recent diagnosis of IPF in Dec 2020 and follows Dr. Elsworth Soho presents for annual visit to discuss hypertension and hyperlipidemia.  After his knee replacement he had become hypotensive and losartan was discontinued.  He remains asymptomatic.  Past Medical History:  Diagnosis Date  . Acute bronchiolitis due to other infectious organisms 01/26/2013  . Arthritis    "knees" (11/03/2015)  . Extrapulmonary TB (tuberculosis) 02/02/2013  . High risk medication use 12/27/2016  . History of hiatal hernia 2007; 2009  . Hypertension   . Pleural effusion 01/26/2013   Past Surgical History:  Procedure Laterality Date  . CATARACT EXTRACTION W/ INTRAOCULAR LENS  IMPLANT, BILATERAL Bilateral   . HERNIA REPAIR    . HIATAL HERNIA REPAIR  2007; 2009  . JOINT REPLACEMENT    . TOTAL KNEE ARTHROPLASTY Left 11/03/2015  . TOTAL KNEE ARTHROPLASTY Left 11/03/2015   Procedure: Left Total Knee Arthroplasty;  Surgeon: Garald Balding, MD;  Location: [place];  Service: Orthopedics;  Laterality: Left;  . TOTAL KNEE ARTHROPLASTY Right 07/17/2018   Procedure: RIGHT TOTAL KNEE ARTHROPLASTY;  Surgeon: Garald Balding, MD;  Location: [place];  Service: Orthopedics;  Laterality: Right;  Marland Kitchen VIDEO BRONCHOSCOPY Bilateral 01/31/2013   Procedure: VIDEO BRONCHOSCOPY WITHOUT FLUORO;  Surgeon: Wilhelmina Mcardle, MD;  Location: [facility] ENDOSCOPY;  Service: Cardiopulmonary;  Laterality: Bilateral;   Social History   Tobacco Use  . Smoking status: Never Smoker  . Smokeless tobacco: Never Used  Substance Use Topics  . Alcohol use: No     ROS  Review of Systems  Cardiovascular: Negative for dyspnea on exertion and leg swelling.  Musculoskeletal: Positive for joint pain.  Gastrointestinal: Negative for melena.   Objective  Blood pressure (!) 163/90, pulse 75, temperature 97.8 F (36.6 C), height 5' 7.5" (1.715 m), weight 159 lb 6.4 oz (72.3 kg), SpO2 98 %.  Vitals with BMI 09/06/2019 08/07/2019 06/03/2019  Height 5' 7.5" 5' 7.5" 5' 7.5"  Weight 159 lbs 6 oz 159 lbs 10 oz 159 lbs  BMI 24.58 79.02 40.97  Systolic 353 299 242  Diastolic 90 64 72  Pulse 75 80 60     Physical Exam  Neck: No thyromegaly present.  Cardiovascular: Normal rate, regular rhythm, normal heart sounds and intact distal pulses. Exam reveals no gallop.  No murmur heard. No leg edema, no JVD.  Pulmonary/Chest: Effort normal. He has rales (coarse bibasilar ronchi present).  Abdominal: Soft. Bowel sounds are normal.  Musculoskeletal:     Cervical back: Neck supple.  Skin: Skin is warm and dry.   Laboratory examination:   Recent Labs    12/06/18 1008 06/07/19 1426  NA 138 136  K 4.6 4.4  CL 103 105  CO2 29 25  GLUCOSE 77 88  BUN 16 14  CREATININE 1.00 1.00  CALCIUM 8.9 8.6  GFRNONAA 74 74  GFRAA 86 86   CrCl cannot be calculated (Patient's most recent lab result is older than the maximum 21 days allowed.).  CMP Latest Ref Rng & Units 06/07/2019 12/06/2018 07/19/2018  Glucose 65 - 99 mg/dL 88 77 854(O)  BUN 7 - 25 mg/dL 14 16 18   Creatinine 0.70 - 1.18 mg/dL 2.70 3.50  Sodium 135 - 146 mmol/L 136 138 138  Potassium 3.5 - 5.3 mmol/L 4.4 4.6 4.5  Chloride 98 - 110 mmol/L 105 103 106  CO2 20 - 32 mmol/L 25 29 28   Calcium 8.6 - 10.3 mg/dL 8.6 8.9 0.93)  Total Protein 6.1 - 8.1 g/dL 7.2 7.1 -  Total Bilirubin 0.2 - 1.2 mg/dL 0.6 0.8 -  Alkaline Phos 38 - 126 U/L - - -  AST 10 - 35 U/L 25 25 -  ALT 9 - 46 U/L 15 14 -   CBC Latest Ref Rng & Units 06/07/2019 12/06/2018 07/19/2018  WBC 3.8 - 10.8 Thousand/uL 5.1 5.2 10.5  Hemoglobin  13.2 - 17.1 g/dL 02/05/2019 07/21/2018 29.9)  Hematocrit 38.5 - 50.0 % 42.5 41.5 27.8(L)  Platelets 140 - 400 Thousand/uL 163 161 138(L)   Lipid Panel  No results found for: CHOL, TRIG, HDL, CHOLHDL, VLDL, LDLCALC, LDLDIRECT HEMOGLOBIN A1C No results found for: HGBA1C, MPG TSH No results for input(s): TSH in the last 8760 hours. External labs: Cholesterol, total 181.000 M 06/24/2019 HDL 46.000 M 06/24/2019 LDL 117 Triglycerides 92.000 M 06/24/2019  Hemoglobin 15.000 G/ 06/24/2019. Platelets 151.000 X 06/24/2019 Creatinine, Serum 1.060 MG/ 06/24/2019 Potassium 4.400 06/07/2019  ALT (SGPT) 18.000 IU/ 06/24/2019 TSH 1.980 06/24/2019  Medications and allergies  No Known Allergies   Current Outpatient Medications  Medication Instructions  . Cosentyx Sensoready (300 MG) 300 mg, Subcutaneous, Every 28 days  . losartan (COZAAR) 25 mg, Oral, Daily  . multivitamin-iron-minerals-folic acid (CENTRUM) chewable tablet 1 tablet, Oral, Every 3 DAYS   Radiology:  No results found.  Cardiac Studies:   Echocardiogram [11/12/2015]:  Left ventricle cavity is small. Normal global wall motion. Normal diastolic filling pattern. Calculated EF 66%. Trace mitral regurgitation. Mild to moderate tricuspid regurgitation. Borderline pulmonary hypertension. Pulmonary artery systolic pressure is estimated at 31 mm Hg. Trace pulmonic regurgitation. compared to the study done on 09/19/11, TR new.  Nuclear stress test [11/16/2015]:  1. The resting electrocardiogram demonstrated normal sinus rhythm and normal resting conduction. Poor R wave progression. Stress EKG is non diagnostic for ischemia as it is a pharmacologic stress using Lexiscan. 2. Myocardial perfusion imaging is normal. The LV appeared to be small. Overall left ventricular systolic function was normal without regional wall motion abnormalities. The left ventricular ejection fraction was mildly reduced at 49%. This is a low risk study.  Assessment      ICD-10-CM   1. Essential hypertension  I10 EKG 12-Lead    DISCONTINUED: losartan (COZAAR) 25 MG tablet  2. Coronary artery calcification seen on CAT scan  I25.10   3. Mild hyperlipidemia  E78.5   4. ILD (interstitial lung disease) (HCC)  J84.9 Ambulatory referral to Pulmonology    EKG 09/06/2019: Normal sinus rhythm at rate of 62 bpm, left axis deviation, left intrafascicular block. IRBBB. No evidence of ischemia. No significant change from  Prior EKG.   Meds ordered this encounter  Medications  . DISCONTD: losartan (COZAAR) 25 MG tablet    Sig: Take 1 tablet (25 mg total) by mouth daily.    Dispense:  90 tablet    Refill:  3    There are no discontinued medications.   Recommendations:   Axzel Rockhill  is a 74 y.o. Asian [name] male with history of hypertension, psoriatic arthritis and follows Dr. 66,  very mild hyperlipidemia and coronary calcificaiton by CT chest, recent diagnosis of IPF in Dec 2020 and follows Dr. Vassie Loll presents for annual visit to discuss hypertension and hyperlipidemia.  Patient is very reluctant to making any changes to his medications.  I discussed with him that his blood pressure is slightly elevated, previously was on losartan 25 mg and blood pressure has been relatively well controlled in fact he would do better with losartan 50 mg.  Following his right knee replacement he has had low blood pressure hence it was discontinued.  Advised him to restart the medication.  I also discussed with him regarding his hyperlipidemia, LDL is not at goal in view of coronary calcification, patient's reluctance is again noted with regard to statin therapy and he states to make changes to his diet with going high-fiber diet.  He has now been diagnosed with IPF, I encouraged him to start therapy, he would like to wait till COVID-19 pandemic is over.  I will send a referral to Dr. Vassie Loll to make a follow-up appointment in 6 months.  From cardiac standpoint otherwise  he is doing well, no clinical evidence of heart failure, I will see him back in 1 year.  I reviewed his external labs.  I reviewed his pulmonary function studies as well.  CC: Cyril Mourning, MD  Yates Decamp, MD, [facility] 09/07/2019, 2:39 PM [facility] [place] Office: [phone]

## 2019-09-09 ENCOUNTER — Encounter: Payer: Self-pay | Admitting: Pulmonary Disease

## 2019-09-10 NOTE — Progress Notes (Signed)
Office Visit Note  Patient: Alan Roman             Date of Birth: 1946-06-13           MRN: [id number]             PCP: Ignatius Specking, MD Referring: Ignatius Specking, MD Visit Date: 09/11/2019 Occupation: @GUAROCC @  Subjective:  Medication monitoring   History of Present Illness: Alan Roman is a 74 y.o. male with history of psoriatic arthritis.  He is on Cosentyx 300 mg sq injections every 28 days.  He states he held his cosentyx injection in January and has not resumed due to wanting to wait until he had covid vaccines.  He has received both covid-19 vaccines.  He denies any joint pain or joint swelling at this time.  He denies any morning stiffness.  He states he was diagnosed with pulmonary fibrosis by Dr. February in October. He has been following up with Dr. November and had a consultation with Dr. Vassie Loll.     Activities of Daily Living:  Patient reports morning stiffness for 0 minutes.   Patient Denies nocturnal pain.  Difficulty dressing/grooming: Denies Difficulty climbing stairs: Denies Difficulty getting out of chair: Denies Difficulty using hands for taps, buttons, cutlery, and/or writing: Denies  Review of Systems  Constitutional: Negative for fatigue.  HENT: Negative for mouth sores, mouth dryness and nose dryness.   Eyes: Negative for itching and dryness.  Respiratory: Negative for shortness of breath, wheezing and difficulty breathing.   Cardiovascular: Negative for chest pain and palpitations.  Gastrointestinal: Negative for blood in stool, constipation and diarrhea.  Endocrine: Negative for increased urination.  Genitourinary: Negative for difficulty urinating and painful urination.  Musculoskeletal: Negative for arthralgias, joint pain, joint swelling and morning stiffness.  Skin: Negative for rash.  Allergic/Immunologic: Negative for susceptible to infections.  Neurological: Negative for dizziness, light-headedness, headaches, memory loss and weakness.    Hematological: Negative for bruising/bleeding tendency.  Psychiatric/Behavioral: Negative for confusion.    PMFS History:  Patient Active Problem List   Diagnosis Date Noted  . At risk for aspiration 08/07/2019  . ILD (interstitial lung disease) (HCC) 06/03/2019  . Elevated diaphragm 06/03/2019  . Dyspnea on exertion 05/06/2019  . Vocal cord atrophy 05/06/2019  . History of total right knee replacement 07/17/2018  . Primary osteoarthritis of right knee 05/31/2018  . History of tuberculosis 11/05/2015  . Orthostatic hypotension 11/05/2015  . Constipation 11/05/2015  . Thrombocytopenia (HCC) 11/05/2015  . Normocytic anemia 11/05/2015  . Psoriatic arthritis (HCC) 11/03/2015  . Essential hypertension 11/03/2015  . Osteoarthritis of left knee 11/03/2015  . Status post total knee replacement, left 11/03/2015  . Psoriasis 01/23/2013  . Immunocompromised state (HCC) 01/23/2013    Past Medical History:  Diagnosis Date  . Acute bronchiolitis due to other infectious organisms 01/26/2013  . Arthritis    "knees" (11/03/2015)  . Extrapulmonary TB (tuberculosis) 02/02/2013  . High risk medication use 12/27/2016  . History of hiatal hernia 2007; 2009  . Hypertension   . Pleural effusion 01/26/2013    History reviewed. No pertinent family history. Past Surgical History:  Procedure Laterality Date  . CATARACT EXTRACTION W/ INTRAOCULAR LENS  IMPLANT, BILATERAL Bilateral   . HERNIA REPAIR    . HIATAL HERNIA REPAIR  2007; 2009  . JOINT REPLACEMENT    . TOTAL KNEE ARTHROPLASTY Left 11/03/2015  . TOTAL KNEE ARTHROPLASTY Left 11/03/2015   Procedure: Left Total Knee Arthroplasty;  Surgeon: 01/03/2016,  MD;  Location: [facility] OR;  Service: Orthopedics;  Laterality: Left;  . TOTAL KNEE ARTHROPLASTY Right 07/17/2018   Procedure: RIGHT TOTAL KNEE ARTHROPLASTY;  Surgeon: Valeria Batman, MD;  Location: [facility];  Service: Orthopedics;  Laterality: Right;  Marland Kitchen VIDEO BRONCHOSCOPY Bilateral 01/31/2013    Procedure: VIDEO BRONCHOSCOPY WITHOUT FLUORO;  Surgeon: Merwyn Katos, MD;  Location: [facility] ENDOSCOPY;  Service: Cardiopulmonary;  Laterality: Bilateral;   Social History   Social History Narrative  . Not on file   Immunization History  Administered Date(s) Administered  . Influenza, High Dose Seasonal PF 04/22/2019  . PPD Test 04/06/2010  . Tdap 05/30/2017     Objective: Vital Signs: BP 129/77 (BP Location: Left Arm, Patient Position: Sitting, Cuff Size: Normal)   Pulse 62   Resp 13   Ht 5\' 7"  (1.702 m)   Wt 158 lb (71.7 kg)   BMI 24.75 kg/m    Physical Exam Vitals and nursing note reviewed.  Constitutional:      Appearance: He is well-developed.  HENT:     Head: Normocephalic and atraumatic.  Eyes:     Conjunctiva/sclera: Conjunctivae normal.     Pupils: Pupils are equal, round, and reactive to light.  Pulmonary:     Effort: Pulmonary effort is normal.  Abdominal:     General: Bowel sounds are normal.     Palpations: Abdomen is soft.  Musculoskeletal:     Cervical back: Normal range of motion and neck supple.  Skin:    General: Skin is warm and dry.     Capillary Refill: Capillary refill takes less than 2 seconds.  Neurological:     Mental Status: He is alert and oriented to person, place, and time.  Psychiatric:        Behavior: Behavior normal.      Musculoskeletal Exam: C-spine, thoracic spine, and lumbar spine good ROM.  No midline spinal tenderness.  No SI joint tenderness.  Shoulder joints, elbow joints, wrist joints, MCPs, PIPs, and DIPs good ROM with no synovitis.  Hip joints, knee joints, MTPs, PIPs, and DIPs good ROM with no synovitis.  No warmth or effusion of knee joints. No tenderness or swelling of ankle joints.    CDAI Exam: CDAI Score: -- Patient Global: --; Provider Global: -- Swollen: --; Tender: -- Joint Exam 09/11/2019   No joint exam has been documented for this visit   There is currently no information documented on the homunculus. Go  to the Rheumatology activity and complete the homunculus joint exam.  Investigation: No additional findings.  Imaging: No results found.  Recent Labs: Lab Results  Component Value Date   WBC 5.1 06/07/2019   HGB 14.2 06/07/2019   PLT 163 06/07/2019   NA 136 06/07/2019   K 4.4 06/07/2019   CL 105 06/07/2019   CO2 25 06/07/2019   GLUCOSE 88 06/07/2019   BUN 14 06/07/2019   CREATININE 1.00 06/07/2019   BILITOT 0.6 06/07/2019   ALKPHOS 75 07/04/2018   AST 25 06/07/2019   ALT 15 06/07/2019   PROT 7.2 06/07/2019   ALBUMIN 3.8 07/04/2018   CALCIUM 8.6 06/07/2019   GFRAA 86 06/07/2019    Speciality Comments: No specialty comments available.  Procedures:  No procedures performed Allergies: Patient has no known allergies.      Assessment / Plan:     Visit Diagnoses: Psoriatic arthritis (HCC): He has no synovitis or dactylitis on exam.  He has not had any recent psoriatic arthritis flares.  His  last dose of Cosentyx was in December 2020.  He decided to hold Cosentyx while receiving both COVID-19 vaccinations.  He has not had any increased joint pain or joint swelling since holding Cosentyx.  He has no active psoriasis at this time.  He has no SI joint tenderness on exam.  He is not experiencing any morning stiffness or nocturnal pain.  He has no Achilles tendinitis or plantar fasciitis.  He will resume Cosentyx 300 mg sq injections every 28 days.  He was advised to notify us if he develops increased joint pain or joint swelling.  He will follow-up in the office in 5 months.  Psoriasis: He has no active psoriasis at this time.  High risk medication use - Cosentyx 300 mg every 28 days.  Previously treated for TB and last chest x-ray normal on 07/17/2018.  High-resolution chest CT on 05/31/2019.  CBC and CMP will be drawn today to monitor for drug toxicity.  He will return for lab work in May and every 3 months.  Standing orders are in place.  He has received both COVID-19  vaccinations.- Plan: COMPLETE METABOLIC PANEL WITH GFR, CBC with Differential/Platelet  ILD (interstitial lung disease) (HCC) - Dr. Vassie Loll: He had high-resolution chest CT on 05/31/2019 which revealed mild bibasilar predominant pulmonary fibrosis and pattern featuring subtle subpleural reticular interstitial opacity, mild traction of bronchiectasis and occasional areas of bronchiolectasis.  These findings are slightly worsened since 11/10/2015.  The findings are consistent with mild pulmonary fibrosis and probable UIP pattern. PFTs were ordered on 07/19/2019 and findings revealed severe restriction and moderately severe diffusion defect. The following lab were obtained: 06/03/19: ANA 1:40 NH, RF 14, Ro-, La-, Scl-70 negative, centromere ab, aldolase 4.9, Anti-JO-1 negative.  Labs were reviewed today in the office. He has been following up with Dr. Vassie Loll and [name], NP who recommend starting on antifibrotic therapies.  At this time the patient prefers to follow conservative management with PFTs every 6 months.  His next PFT is scheduled in May 2021.  Status post total knee replacement, right - Dr. Cleophas Dunker. 07/17/2018-He has warmth but no effusion on exam.  He has good range of motion with no discomfort at this time.  Status post total knee replacement, left - Dr. Cleophas Dunker. 2017: Doing well.  He has good range of motion with no discomfort.  Primary osteoarthritis of both feet: He has no discomfort in his feet at this time.  Thrombocytopenia (HCC): CBC will be checked today.  Essential hypertension: Blood pressure was 129/77 in the office today.  He is taking losartan 25 mg 1 tablet by mouth daily prescribed by Dr. Jacinto Halim.  He continues to see Dr. Jacinto Halim on a yearly basis.  Extrapulmonary TB (tuberculosis) - treated in the past.  High-resolution chest CT ordered on 05/31/2019.  Normocytic anemia: CBC with differential was ordered today.  Orders: Orders Placed This Encounter  Procedures  .  COMPLETE METABOLIC PANEL WITH GFR  . CBC with Differential/Platelet   No orders of the defined types were placed in this encounter.   Face-to-face time spent with patient was 30 minutes. Greater than 50% of time was spent in counseling and coordination of care.  Follow-Up Instructions: Return in about 5 months (around 02/08/2020) for Psoriatic arthritis, Osteoarthritis.   Gearldine Bienenstock, PA-C   I examined and evaluated the patient with Sherron Ales PA.  Patient had no synovitis on examination.  He had no psoriasis.  He had his last Cosentyx injection in December.  We  will check labs today.  We will resume Cosentyx.  He was also recently diagnosed with pulmonary fibrosis.  He has been seeing Dr. Eartha Inch.  The plan of care was discussed as noted above.  Bo Merino, MD  Note - This record has been created using Editor, commissioning.  Chart creation errors have been sought, but may not always  have been located. Such creation errors do not reflect on  the standard of medical care.

## 2019-09-11 ENCOUNTER — Ambulatory Visit: Payer: Medicare Other | Admitting: Rheumatology

## 2019-09-11 ENCOUNTER — Encounter: Payer: Self-pay | Admitting: Rheumatology

## 2019-09-11 ENCOUNTER — Other Ambulatory Visit: Payer: Self-pay

## 2019-09-11 VITALS — BP 129/77 | HR 62 | Resp 13 | Ht 67.0 in | Wt 158.0 lb

## 2019-09-11 DIAGNOSIS — Z79899 Other long term (current) drug therapy: Secondary | ICD-10-CM

## 2019-09-11 DIAGNOSIS — I10 Essential (primary) hypertension: Secondary | ICD-10-CM

## 2019-09-11 DIAGNOSIS — L409 Psoriasis, unspecified: Secondary | ICD-10-CM

## 2019-09-11 DIAGNOSIS — M19071 Primary osteoarthritis, right ankle and foot: Secondary | ICD-10-CM

## 2019-09-11 DIAGNOSIS — Z96651 Presence of right artificial knee joint: Secondary | ICD-10-CM

## 2019-09-11 DIAGNOSIS — J849 Interstitial pulmonary disease, unspecified: Secondary | ICD-10-CM

## 2019-09-11 DIAGNOSIS — M19072 Primary osteoarthritis, left ankle and foot: Secondary | ICD-10-CM

## 2019-09-11 DIAGNOSIS — D696 Thrombocytopenia, unspecified: Secondary | ICD-10-CM

## 2019-09-11 DIAGNOSIS — L4050 Arthropathic psoriasis, unspecified: Secondary | ICD-10-CM

## 2019-09-11 DIAGNOSIS — D649 Anemia, unspecified: Secondary | ICD-10-CM

## 2019-09-11 DIAGNOSIS — A1889 Tuberculosis of other sites: Secondary | ICD-10-CM

## 2019-09-11 DIAGNOSIS — Z96652 Presence of left artificial knee joint: Secondary | ICD-10-CM

## 2019-09-11 LAB — CBC WITH DIFFERENTIAL/PLATELET
Absolute Monocytes: 647 cells/uL (ref 200–950)
Basophils Absolute: 22 cells/uL (ref 0–200)
Basophils Relative: 0.5 %
Eosinophils Absolute: 123 cells/uL (ref 15–500)
Eosinophils Relative: 2.8 %
HCT: 43.9 % (ref 38.5–50.0)
Hemoglobin: 15 g/dL (ref 13.2–17.1)
Lymphs Abs: 1021 cells/uL (ref 850–3900)
MCH: 31.8 pg (ref 27.0–33.0)
MCHC: 34.2 g/dL (ref 32.0–36.0)
MCV: 93 fL (ref 80.0–100.0)
MPV: 11.1 fL (ref 7.5–12.5)
Monocytes Relative: 14.7 %
Neutro Abs: 2587 cells/uL (ref 1500–7800)
Neutrophils Relative %: 58.8 %
Platelets: 135 10*3/uL — ABNORMAL LOW (ref 140–400)
RBC: 4.72 10*6/uL (ref 4.20–5.80)
RDW: 11.9 % (ref 11.0–15.0)
Total Lymphocyte: 23.2 %
WBC: 4.4 10*3/uL (ref 3.8–10.8)

## 2019-09-11 LAB — COMPLETE METABOLIC PANEL WITH GFR
AG Ratio: 1.3 (calc) (ref 1.0–2.5)
ALT: 17 U/L (ref 9–46)
AST: 28 U/L (ref 10–35)
Albumin: 4.1 g/dL (ref 3.6–5.1)
Alkaline phosphatase (APISO): 97 U/L (ref 35–144)
BUN: 13 mg/dL (ref 7–25)
CO2: 28 mmol/L (ref 20–32)
Calcium: 8.9 mg/dL (ref 8.6–10.3)
Chloride: 103 mmol/L (ref 98–110)
Creat: 1.16 mg/dL (ref 0.70–1.18)
GFR, Est African American: 71 mL/min/{1.73_m2} (ref >=60)
GFR, Est Non African American: 62 mL/min/{1.73_m2} (ref >=60)
Globulin: 3.2 g/dL (calc) (ref 1.9–3.7)
Glucose, Bld: 91 mg/dL (ref 65–99)
Potassium: 4 mmol/L (ref 3.5–5.3)
Sodium: 138 mmol/L (ref 135–146)
Total Bilirubin: 0.7 mg/dL (ref 0.2–1.2)
Total Protein: 7.3 g/dL (ref 6.1–8.1)

## 2019-09-12 NOTE — Progress Notes (Signed)
Platelets are low-135.  Rest of CBC WNL.  Patient has a history of thrombocytopenia.  We will continue to monitor.  CMP WNL.

## 2019-09-13 ENCOUNTER — Telehealth: Payer: Self-pay

## 2019-09-13 ENCOUNTER — Other Ambulatory Visit: Payer: Self-pay

## 2019-09-13 DIAGNOSIS — I10 Essential (primary) hypertension: Secondary | ICD-10-CM

## 2019-09-13 MED ORDER — LOSARTAN POTASSIUM 25 MG PO TABS: 25.0000 mg | ORAL_TABLET | Freq: Every day | ORAL | 3 refills | Status: DC

## 2019-09-13 MED ORDER — LOSARTAN POTASSIUM 25 MG PO TABS: 25.0000 mg | ORAL_TABLET | Freq: Every day | ORAL | 0 refills | Status: DC

## 2019-09-13 NOTE — Telephone Encounter (Signed)
Done. Pt aware.//ah

## 2019-09-20 ENCOUNTER — Telehealth: Payer: Self-pay | Admitting: Rheumatology

## 2019-09-20 ENCOUNTER — Other Ambulatory Visit: Payer: Self-pay | Admitting: *Deleted

## 2019-09-20 DIAGNOSIS — L408 Other psoriasis: Secondary | ICD-10-CM

## 2019-09-20 MED ORDER — COSENTYX SENSOREADY (300 MG) 150 MG/ML ~~LOC~~ SOAJ: 300.0000 mg | SUBCUTANEOUS | 0 refills | Status: DC

## 2019-09-20 NOTE — Telephone Encounter (Signed)
Prescription sent to the pharmacy.

## 2019-09-20 NOTE — Telephone Encounter (Signed)
Festus Barren from [facility] specialty pharmacy left a voicemail stating a new prescription is needed for patient's Cosentyx.  Please call or fax it to our office.  Phone #[phone]   Fax #[phone]

## 2019-09-20 NOTE — Telephone Encounter (Signed)
Last Visit: 09/11/19 Next Visit: 02/06/20  Labs: 09/11/19 Platelets are low-135. Rest of CBC WNL. Patient has a history of thrombocytopenia. We will continue to monitor. CMP WNL.  TB Gold: last chest x-ray normal on 07/17/2018.  High-resolution chest CT on 05/31/2019.  Okay to refill per Dr. Corliss Skains

## 2020-01-28 ENCOUNTER — Telehealth: Payer: Self-pay

## 2020-01-28 NOTE — Telephone Encounter (Signed)
-----   Message from Oretha Milch, MD sent at 01/27/2020  4:24 PM EDT ----- Regarding: RE: Pt update ok ----- Message ----- From: Gaylyn Cheers, CMA Sent: 01/24/2020   2:43 PM EDT To: Oretha Milch, MD Subject: RE: Pt update                                  I can't get this patient in with you in [facility] until August 26, is this ok? ----- Message ----- From: Oretha Milch, MD Sent: 01/23/2020   9:13 AM EDT To: Gaylyn Cheers, CMA, Cydney Ok, CMA Subject: FW: Pt update                                  Needs FU with me - [place] office  RA ----- Message ----- From: Coral Ceo, NP Sent: 08/07/2019  12:51 PM EDT To: Oretha Milch, MD Subject: Pt update                                      RA,Saw this patient office today.  I have sent to the office note.  I spoke with the patient's daughter [name] at his request and they have decided to conservatively watch his symptoms as well as hold off on starting antifibrotic's at this time.  I have ordered a spirometry with DLCO in 6 months to monitor breathing.Please review the note.  If you have any other additional thoughts or concerns or issues please let me know.Hope you are feeling betterBrian

## 2020-01-28 NOTE — Telephone Encounter (Signed)
ATC patient's daughter to schedule patient for an appointment [facility] We can get patient in with Dr. Vassie Loll in August.

## 2020-02-04 NOTE — Progress Notes (Deleted)
Office Visit Note  Patient: Alan Roman             Date of Birth: 1946-03-07           MRN: [id number]             PCP: Ignatius Specking, MD Referring: Ignatius Specking, MD Visit Date: 02/17/2020 Occupation: @GUAROCC @  Subjective:  No chief complaint on file.   History of Present Illness: Alan Roman is a 74 y.o. male ***   Activities of Daily Living:  Patient reports morning stiffness for *** {minute/hour:19697}.   Patient {ACTIONS;DENIES/REPORTS:21021675::"Denies"} nocturnal pain.  Difficulty dressing/grooming: {ACTIONS;DENIES/REPORTS:21021675::"Denies"} Difficulty climbing stairs: {ACTIONS;DENIES/REPORTS:21021675::"Denies"} Difficulty getting out of chair: {ACTIONS;DENIES/REPORTS:21021675::"Denies"} Difficulty using hands for taps, buttons, cutlery, and/or writing: {ACTIONS;DENIES/REPORTS:21021675::"Denies"}  No Rheumatology ROS completed.   PMFS History:  Patient Active Problem List   Diagnosis Date Noted  . At risk for aspiration 08/07/2019  . ILD (interstitial lung disease) (HCC) 06/03/2019  . Elevated diaphragm 06/03/2019  . Dyspnea on exertion 05/06/2019  . Vocal cord atrophy 05/06/2019  . History of total right knee replacement 07/17/2018  . Primary osteoarthritis of right knee 05/31/2018  . History of tuberculosis 11/05/2015  . Orthostatic hypotension 11/05/2015  . Constipation 11/05/2015  . Thrombocytopenia (HCC) 11/05/2015  . Normocytic anemia 11/05/2015  . Psoriatic arthritis (HCC) 11/03/2015  . Essential hypertension 11/03/2015  . Osteoarthritis of left knee 11/03/2015  . Status post total knee replacement, left 11/03/2015  . Psoriasis 01/23/2013  . Immunocompromised state (HCC) 01/23/2013    Past Medical History:  Diagnosis Date  . Acute bronchiolitis due to other infectious organisms 01/26/2013  . Arthritis    "knees" (11/03/2015)  . Extrapulmonary TB (tuberculosis) 02/02/2013  . High risk medication use 12/27/2016  . History of hiatal hernia  2007; 2009  . Hypertension   . Pleural effusion 01/26/2013    No family history on file. Past Surgical History:  Procedure Laterality Date  . CATARACT EXTRACTION W/ INTRAOCULAR LENS  IMPLANT, BILATERAL Bilateral   . HERNIA REPAIR    . HIATAL HERNIA REPAIR  2007; 2009  . JOINT REPLACEMENT    . TOTAL KNEE ARTHROPLASTY Left 11/03/2015  . TOTAL KNEE ARTHROPLASTY Left 11/03/2015   Procedure: Left Total Knee Arthroplasty;  Surgeon: 01/03/2016, MD;  Location: [facility] OR;  Service: Orthopedics;  Laterality: Left;  . TOTAL KNEE ARTHROPLASTY Right 07/17/2018   Procedure: RIGHT TOTAL KNEE ARTHROPLASTY;  Surgeon: 07/19/2018, MD;  Location: [facility] OR;  Service: Orthopedics;  Laterality: Right;  [facility] VIDEO BRONCHOSCOPY Bilateral 01/31/2013   Procedure: VIDEO BRONCHOSCOPY WITHOUT FLUORO;  Surgeon: 04/03/2013, MD;  Location: [facility] ENDOSCOPY;  Service: Cardiopulmonary;  Laterality: Bilateral;   Social History   Social History Narrative  . Not on file   Immunization History  Administered Date(s) Administered  . Influenza, High Dose Seasonal PF 04/22/2019  . PPD Test 04/06/2010  . Tdap 05/30/2017     Objective: Vital Signs: There were no vitals taken for this visit.   Physical Exam   Musculoskeletal Exam: ***  CDAI Exam: CDAI Score: -- Patient Global: --; Provider Global: -- Swollen: --; Tender: -- Joint Exam 02/17/2020   No joint exam has been documented for this visit   There is currently no information documented on the homunculus. Go to the Rheumatology activity and complete the homunculus joint exam.  Investigation: No additional findings.  Imaging: No results found.  Recent Labs: Lab Results  Component Value Date   WBC 4.4 09/11/2019  HGB 15.0 09/11/2019   PLT 135 (L) 09/11/2019   NA 138 09/11/2019   K 4.0 09/11/2019   CL 103 09/11/2019   CO2 28 09/11/2019   GLUCOSE 91 09/11/2019   BUN 13 09/11/2019   CREATININE 1.16 09/11/2019   BILITOT 0.7 09/11/2019   ALKPHOS  75 07/04/2018   AST 28 09/11/2019   ALT 17 09/11/2019   PROT 7.3 09/11/2019   ALBUMIN 3.8 07/04/2018   CALCIUM 8.9 09/11/2019   GFRAA 71 09/11/2019    Speciality Comments: No specialty comments available.  Procedures:  No procedures performed Allergies: Patient has no known allergies.   Assessment / Plan:     Visit Diagnoses: No diagnosis found.  Orders: No orders of the defined types were placed in this encounter.  No orders of the defined types were placed in this encounter.   Face-to-face time spent with patient was *** minutes. Greater than 50% of time was spent in counseling and coordination of care.  Follow-Up Instructions: No follow-ups on file.   Gearldine Bienenstock, PA-C  Note - This record has been created using Dragon software.  Chart creation errors have been sought, but may not always  have been located. Such creation errors do not reflect on  the standard of medical care.

## 2020-02-06 ENCOUNTER — Ambulatory Visit: Payer: Medicare Other | Admitting: Rheumatology

## 2020-02-10 ENCOUNTER — Telehealth: Payer: Self-pay | Admitting: Rheumatology

## 2020-02-10 NOTE — Progress Notes (Signed)
Office Visit Note  Patient: Alan Roman             Date of Birth: Sep 26, 1945           MRN: [id number]             PCP: Ignatius Specking, MD Referring: Ignatius Specking, MD Visit Date: 02/12/2020 Occupation: @GUAROCC @  Subjective:  Psoriasis    History of Present Illness: Alan Roman is a 74 y.o. male with history of psoriatic arthritis and ILD.  Patient has been off of Cosentyx since January 2021 due to his insurance no longer covering it.  He did not qualify for patient assistance at that time.  He has not been taking any immunosuppressive agents since then.  He denies any flares until the past 3 to 4 weeks.  He has been having some discomfort in the left third PIP joint.  He denies any other joint pain or joint swelling.  He denies any Achilles tendinitis or plantar fasciitis.  He denies any SI joint pain.  He is having a flare of psoriasis on bilateral lower extremities.  He has been applying a topical agent as needed but has not noticed much improvement.   Activities of Daily Living:  Patient reports morning stiffness for 0 minutes.   Patient Denies nocturnal pain.  Difficulty dressing/grooming: Denies Difficulty climbing stairs: Denies Difficulty getting out of chair: Denies Difficulty using hands for taps, buttons, cutlery, and/or writing: Denies  Review of Systems  Constitutional: Negative for fatigue and night sweats.  HENT: Negative for mouth sores, mouth dryness and nose dryness.   Eyes: Negative for redness and dryness.  Respiratory: Negative for shortness of breath and difficulty breathing.   Cardiovascular: Negative for chest pain, palpitations, hypertension, irregular heartbeat and swelling in legs/feet.  Gastrointestinal: Negative for constipation and diarrhea.  Endocrine: Negative for increased urination.  Genitourinary: Negative for painful urination.  Musculoskeletal: Positive for arthralgias and joint pain. Negative for joint swelling, myalgias, muscle  weakness, morning stiffness, muscle tenderness and myalgias.  Skin: Positive for rash (Psoriasis ). Negative for color change, hair loss, nodules/bumps, skin tightness, ulcers and sensitivity to sunlight.  Allergic/Immunologic: Negative for susceptible to infections.  Neurological: Negative for dizziness, fainting, memory loss, night sweats and weakness.  Hematological: Negative for swollen glands.  Psychiatric/Behavioral: Negative for depressed mood and sleep disturbance. The patient is not nervous/anxious.     PMFS History:  Patient Active Problem List   Diagnosis Date Noted  . At risk for aspiration 08/07/2019  . ILD (interstitial lung disease) (HCC) 06/03/2019  . Elevated diaphragm 06/03/2019  . Dyspnea on exertion 05/06/2019  . Vocal cord atrophy 05/06/2019  . History of total right knee replacement 07/17/2018  . Primary osteoarthritis of right knee 05/31/2018  . History of tuberculosis 11/05/2015  . Orthostatic hypotension 11/05/2015  . Constipation 11/05/2015  . Thrombocytopenia (HCC) 11/05/2015  . Normocytic anemia 11/05/2015  . Psoriatic arthritis (HCC) 11/03/2015  . Essential hypertension 11/03/2015  . Osteoarthritis of left knee 11/03/2015  . Status post total knee replacement, left 11/03/2015  . Psoriasis 01/23/2013  . Immunocompromised state (HCC) 01/23/2013    Past Medical History:  Diagnosis Date  . Acute bronchiolitis due to other infectious organisms 01/26/2013  . Arthritis    "knees" (11/03/2015)  . Extrapulmonary TB (tuberculosis) 02/02/2013  . High risk medication use 12/27/2016  . History of hiatal hernia 2007; 2009  . Hypertension   . Pleural effusion 01/26/2013    History reviewed. No pertinent family  history. Past Surgical History:  Procedure Laterality Date  . CATARACT EXTRACTION W/ INTRAOCULAR LENS  IMPLANT, BILATERAL Bilateral   . HERNIA REPAIR    . HIATAL HERNIA REPAIR  2007; 2009  . JOINT REPLACEMENT    . TOTAL KNEE ARTHROPLASTY Left 11/03/2015  .  TOTAL KNEE ARTHROPLASTY Left 11/03/2015   Procedure: Left Total Knee Arthroplasty;  Surgeon: Valeria BatmanPeter W Whitfield, MD;  [facility];  Service: Orthopedics;  Laterality: Left;  . TOTAL KNEE ARTHROPLASTY Right 07/17/2018   Procedure: RIGHT TOTAL KNEE ARTHROPLASTY;  Surgeon: Valeria BatmanWhitfield, Peter W, MD;  [facility];  Service: Orthopedics;  Laterality: Right;  Marland Kitchen. VIDEO BRONCHOSCOPY Bilateral 01/31/2013   Procedure: VIDEO BRONCHOSCOPY WITHOUT FLUORO;  Surgeon: Merwyn Katosavid B Simonds, MD;  [facility];  Service: Cardiopulmonary;  Laterality: Bilateral;   Social History   Social History Narrative  . Not on file   Immunization History  Administered Date(s) Administered  . Influenza, High Dose Seasonal PF 04/22/2019  . PPD Test 04/06/2010  . Tdap 05/30/2017     Objective: Vital Signs: BP 126/78 (BP Location: Left Arm, Patient Position: Sitting, Cuff Size: Small)   Pulse 75   Resp 12   Ht 5\' 8"  (1.727 m)   Wt 155 lb 6.4 oz (70.5 kg)   BMI 23.63 kg/m    Physical Exam Vitals and nursing note reviewed.  Constitutional:      Appearance: He is well-developed.  HENT:     Head: Normocephalic and atraumatic.  Eyes:     Conjunctiva/sclera: Conjunctivae normal.     Pupils: Pupils are equal, round, and reactive to light.  Pulmonary:     Effort: Pulmonary effort is normal.  Abdominal:     General: Bowel sounds are normal.     Palpations: Abdomen is soft.  Musculoskeletal:     Cervical back: Normal range of motion and neck supple.  Skin:    General: Skin is warm and dry.     Capillary Refill: Capillary refill takes less than 2 seconds.  Neurological:     Mental Status: He is alert and oriented to person, place, and time.  Psychiatric:        Behavior: Behavior normal.      Musculoskeletal Exam:   C-spine has good range of motion.  Thoracic kyphosis noted.  No midline spinal tenderness.  No SI joint tenderness.  Shoulder joints, elbow joints, wrist joints have good range of motion with  no synovitis.  He has CMC joint prominence bilaterally.  Tenderness and mild inflammation of the left third PIP joint was noted on exam today.  No tenderness or synovitis of MCP joints.  Hip joints have good range of motion with no discomfort.  Bilateral knee replacements have good range of motion.  The right knee replacement is warm to palpation.  Ankle joints have good range of motion with no tenderness or inflammation.  CDAI Exam: CDAI Score: -- Patient Global: --; Provider Global: -- Swollen: --; Tender: -- Joint Exam 02/12/2020   No joint exam has been documented for this visit   There is currently no information documented on the homunculus. Go to the Rheumatology activity and complete the homunculus joint exam.  Investigation: No additional findings.  Imaging: No results found.  Recent Labs: Lab Results  Component Value Date   WBC 4.4 09/11/2019   HGB 15.0 09/11/2019   PLT 135 (L) 09/11/2019   NA 138 09/11/2019   K 4.0 09/11/2019   CL 103 09/11/2019   CO2 28  09/11/2019   GLUCOSE 91 09/11/2019   BUN 13 09/11/2019   CREATININE 1.16 09/11/2019   BILITOT 0.7 09/11/2019   ALKPHOS 75 07/04/2018   AST 28 09/11/2019   ALT 17 09/11/2019   PROT 7.3 09/11/2019   ALBUMIN 3.8 07/04/2018   CALCIUM 8.9 09/11/2019   GFRAA 71 09/11/2019    Speciality Comments: No specialty comments available.  Procedures:  No procedures performed Allergies: Patient has no known allergies.   Assessment / Plan:     Visit Diagnoses: Psoriatic arthritis [facility]): He has tenderness and inflammation of the left third PIP joint.  No dactylitis was noted.  He has no Achilles tendinitis or plantar fasciitis.  He has no SI joint tenderness to palpation.  He is currently having a flare of psoriasis on bilateral lower extremities.  He has been applying a topical agent but has not noticed much improvement.  He has been off of Cosentyx since January 2021 due to his insurance no longer covering it.  We discussed  other treatment options today which may be preferred by his insurance.  We will apply for IV Simponi Aria infusions.  Indications, contraindications, potential side effects of Simponi were discussed today.  All questions were addressed and consent was obtained.  We will notify him once approved.  We will also send in a prescription for clobetasol cream which he can apply topically as needed. He will follow-up in the office in 2 months to assess his response.  Psoriasis: He is currently having a flare of psoriasis on bilateral lower extremities.  He has scattered patches overlying both legs.  He has been applying clobetasol cream which is expired and has not noticed much improvement.  A new prescription for clobetasol cream will be sent to the pharmacy today.  We also be applying for Simponi Aria IV infusions.  Medication counseling:   Baseline Immunosuppressant Therapy Labs TB GOLD   Hepatitis Panel Hepatitis Latest Ref Rng & Units 12/27/2016  Hep B Surface Ag NEGATIVE NEGATIVE  Hep B IgM NON REACTIVE NON REACTIVE  Hep C Ab NEGATIVE NEGATIVE   HIV Lab Results  Component Value Date   HIV NON REACTIVE 01/29/2013   Immunoglobulins Immunoglobulin Electrophoresis Latest Ref Rng & Units 12/27/2016  IgG 694 - 1,618 mg/dL 6,629  IgM 48 - 476 mg/dL 546   SPEP Serum Protein Electrophoresis Latest Ref Rng & Units 09/11/2019  Total Protein 6.1 - 8.1 g/dL 7.3  Albumin 3.8 - 4.8 g/dL -  Alpha-1 0.2 - 0.3 g/dL -  Alpha-2 0.5 - 0.9 g/dL -  Beta Globulin 0.4 - 0.6 g/dL -  Beta 2 0.2 - 0.5 g/dL -  Gamma Globulin 0.8 - 1.7 g/dL -  Interpretation - -   G6PD No results found for: G6PDH TPMT No results found for: TPMT   Chest x-ray: Chest CT 05/31/19 CXR on 07/17/18   Does patient have diagnosis of heart failure?  No  Counseled patient that Simponi Jarold Song is a TNF blocking agent.  Reviewed Simponi Aria dose of 2 mg/kg at weeks 0, 4, and then every 8 weeks thereafter.  Counseled patient on  purpose, proper use, and adverse effects of Simponi.  Reviewed the most common adverse effects including infections and infusion reactions.  Discussed that there is the possibility of an increased risk of malignancy but it is not well understood if this increased risk is due to the medication or the disease state.  Advised patient to get yearly dermatology exams due to risk of skin  cancer.  Reviewed the importance of regular labs while on Simponi therapy.  Counseled patient that Simponi should be held prior to scheduled surgery.  Counseled patient to avoid live vaccines while on Simponi.  Advised patient to get annual influenza vaccine and the pneumococcal vaccine as indicated.  Provided patient with medication education material and answered all questions.  Patient voiced understanding.  Patient consented to Simponi.  Will upload consent into the media tab.  Will submit benefits investigation for Simponi Aria.    High risk medication use -We will apply for Simponi Aria IV infusions. Discontinued Cosentyx due to insurance. Previously treated for TB and last chest x-ray normal on 07/17/2018.  High-resolution chest CT on 05/31/2019.   - Plan: COMPLETE METABOLIC PANEL WITH GFR, CBC with Differential/Platelet  ILD (interstitial lung disease) (HCC) - Dr. Vassie Loll: He had high-resolution chest CT on 05/31/2019 which revealed mild bibasilar predominant pulmonary fibrosis and pattern featuring subtle subpleural reticular interstitial opacity.  He has not developed any new or worsening pulmonary symptoms.  He has an upcoming appointment with Dr. Vassie Loll on 04/22/2020.  Status post total knee replacement, right: Doing well.  He has good range of motion.  Warmth was noted on exam today.  He has no difficulty climbing steps or getting up from a seated position.  Status post total knee replacement, left: Doing well.  He has good range of motion with no discomfort.  No warmth or effusion noted.   Primary osteoarthritis of both  feet: He has no discomfort in her feet at this time.   Thrombocytopenia (HCC): Plt count was 135 on 09/11/19. CBC will be drawn today.   Essential hypertension: BP was 126/78 today.   Extrapulmonary TB (tuberculosis): He will require updated CXR in October 2021.   Normocytic anemia: CBC drawn today.   Orders: Orders Placed This Encounter  Procedures  . COMPLETE METABOLIC PANEL WITH GFR  . CBC with Differential/Platelet   Meds ordered this encounter  Medications  . clobetasol cream (TEMOVATE) 0.05 %    Sig: Apply 1 application topically 2 (two) times daily.    Dispense:  45 g    Refill:  1    Face-to-face time spent with patient was 30 minutes. Greater than 50% of time was spent in counseling and coordination of care.  Follow-Up Instructions: Return in about 2 months (around 04/14/2020) for Psoriatic arthritis.   Gearldine Bienenstock, PA-C  Note - This record has been created using Dragon software.  Chart creation errors have been sought, but may not always  have been located. Such creation errors do not reflect on  the standard of medical care.

## 2020-02-10 NOTE — Telephone Encounter (Signed)
Spoke with patient and advised him that Dr. Corliss Skains or Ladona Ridgel will discuss treatment options at next appointment. Patient is now scheduled for 02/12/2020.

## 2020-02-10 NOTE — Telephone Encounter (Signed)
Patient has a rov on 02/17/2020. He would like to find out if he can start back on MTX 2.5 mg, and Folic Acid. Patient was taking Cosentyx, but stopped that in January due to cost. Insurance does not cover that anymore, and it was costing $3000.00 per month to take. Patient would like to return to MTX. Please call to advise.

## 2020-02-11 NOTE — Telephone Encounter (Signed)
Pt scheduled for Dr. Reginia Naas soonest appointment 04/22/20 at 10:30am.

## 2020-02-12 ENCOUNTER — Telehealth: Payer: Self-pay | Admitting: Pharmacy Technician

## 2020-02-12 ENCOUNTER — Encounter: Payer: Self-pay | Admitting: Physician Assistant

## 2020-02-12 ENCOUNTER — Other Ambulatory Visit: Payer: Self-pay

## 2020-02-12 ENCOUNTER — Ambulatory Visit: Payer: Medicare Other | Admitting: Physician Assistant

## 2020-02-12 VITALS — BP 126/78 | HR 75 | Resp 12 | Ht 68.0 in | Wt 155.4 lb

## 2020-02-12 DIAGNOSIS — M19071 Primary osteoarthritis, right ankle and foot: Secondary | ICD-10-CM

## 2020-02-12 DIAGNOSIS — D649 Anemia, unspecified: Secondary | ICD-10-CM

## 2020-02-12 DIAGNOSIS — M19072 Primary osteoarthritis, left ankle and foot: Secondary | ICD-10-CM

## 2020-02-12 DIAGNOSIS — J849 Interstitial pulmonary disease, unspecified: Secondary | ICD-10-CM

## 2020-02-12 DIAGNOSIS — L409 Psoriasis, unspecified: Secondary | ICD-10-CM | POA: Diagnosis not present

## 2020-02-12 DIAGNOSIS — I10 Essential (primary) hypertension: Secondary | ICD-10-CM

## 2020-02-12 DIAGNOSIS — Z79899 Other long term (current) drug therapy: Secondary | ICD-10-CM

## 2020-02-12 DIAGNOSIS — A1889 Tuberculosis of other sites: Secondary | ICD-10-CM

## 2020-02-12 DIAGNOSIS — Z96651 Presence of right artificial knee joint: Secondary | ICD-10-CM

## 2020-02-12 DIAGNOSIS — L4050 Arthropathic psoriasis, unspecified: Secondary | ICD-10-CM

## 2020-02-12 DIAGNOSIS — Z96652 Presence of left artificial knee joint: Secondary | ICD-10-CM

## 2020-02-12 DIAGNOSIS — D696 Thrombocytopenia, unspecified: Secondary | ICD-10-CM

## 2020-02-12 LAB — CBC WITH DIFFERENTIAL/PLATELET
Absolute Monocytes: 659 cells/uL (ref 200–950)
Basophils Absolute: 43 cells/uL (ref 0–200)
Basophils Relative: 0.7 %
Eosinophils Absolute: 110 cells/uL (ref 15–500)
Eosinophils Relative: 1.8 %
HCT: 40.4 % (ref 38.5–50.0)
Hemoglobin: 14 g/dL (ref 13.2–17.1)
Lymphs Abs: 1403 cells/uL (ref 850–3900)
MCH: 32.9 pg (ref 27.0–33.0)
MCHC: 34.7 g/dL (ref 32.0–36.0)
MCV: 95.1 fL (ref 80.0–100.0)
MPV: 11.4 fL (ref 7.5–12.5)
Monocytes Relative: 10.8 %
Neutro Abs: 3886 cells/uL (ref 1500–7800)
Neutrophils Relative %: 63.7 %
Platelets: 157 10*3/uL (ref 140–400)
RBC: 4.25 10*6/uL (ref 4.20–5.80)
RDW: 11.8 % (ref 11.0–15.0)
Total Lymphocyte: 23 %
WBC: 6.1 10*3/uL (ref 3.8–10.8)

## 2020-02-12 MED ORDER — CLOBETASOL PROPIONATE 0.05 % EX CREA: 1.0000 "application " | TOPICAL_CREAM | Freq: Two times a day (BID) | CUTANEOUS | 1 refills | Status: DC

## 2020-02-12 NOTE — Telephone Encounter (Signed)
Will update as we work through the benefits process.  

## 2020-02-12 NOTE — Telephone Encounter (Signed)
-----   Message from Harlow Asa, RPH-CPP sent at 02/12/2020  1:48 PM EDT ----- Please apply for Simponi Infusions per Sherron Ales, PA-C. Patient has been consented on medication.  Thanks!

## 2020-02-12 NOTE — Patient Instructions (Signed)
Golimumab injection (subcutaneous or intravenous use) What is this medicine? GOLIMUMAB (goe LIM ue mab) is used to treat rheumatoid arthritis, psoriatic arthritis, polyarticular juvenile idiopathic arthritis, and ankylosing spondylitis. It is also used to treat ulcerative colitis. This medicine may be used for other purposes; ask your health care provider or pharmacist if you have questions. COMMON BRAND NAME(S): Simponi, SIMPONI ARIA What should I tell my health care provider before I take this medicine? They need to know if you have any of these conditions:  cancer  diabetes  Guillain-Barre syndrome  heart failure  hepatitis B or history of hepatitis B infection  immune system problems  infection or history of infections  low blood counts like low white cell, platelet, or red cell counts  multiple sclerosis  psoriasis  recently received or scheduled to receive a vaccine  tuberculosis, a positive skin test for tuberculosis or have recently been in close contact with someone who has tuberculosis  an unusual reaction to golimumab, other medicines, latex, rubber, foods, dyes, or preservatives  pregnant or trying to get pregnant  breast-feeding How should I use this medicine? This medicine is for infusion into a vein or for injection under the skin. Infusions are given by a health care professional in a hospital or clinic setting. If you are to give your own medicine at home, you will be taught how to prepare and give this medicine under the skin. Use exactly as directed. Take your medicine at regular intervals. Do not take your medicine more often than directed. It is important that you put your used injectors, needles and syringes in a special sharps container. Do not put them in a trash can. If you do not have a sharps container, call your pharmacist or healthcare provider to get one. A special MedGuide will be given to you by the pharmacist with each prescription and refill.  Be sure to read this information carefully each time. Talk to your pediatrician regarding the use of this medicine in children. While this drug may be prescribed for children as young as 2 years for selected conditions, precautions do apply. Overdosage: If you think you have taken too much of this medicine contact a poison control center or emergency room at once. NOTE: This medicine is only for you. Do not share this medicine with others. What if I miss a dose? If you give your medicine by injection under the skin: If you miss a dose, take it as soon as you can. If it is almost time for your next dose, take only that dose. Do not take double or extra doses. Call your doctor or health care professional if you are not sure how to handle a missed dose. If you are to be given an infusion: It is important not to miss your dose. Call your doctor or health care professional if you are unable to keep an appointment. What may interact with this medicine? Do not take this medicine with any of the following medications:  abatacept  adalimumab  anakinra  certolizumab  etanercept  infliximab  live virus vaccines  rituximab  rilonacept  tocilizumab This medicine may also interact with the following medications:  cyclosporine  theophylline  vaccines  warfarin This list may not describe all possible interactions. Give your health care provider a list of all the medicines, herbs, non-prescription drugs, or dietary supplements you use. Also tell them if you smoke, drink alcohol, or use illegal drugs. Some items may interact with your medicine. What should I   watch for while using this medicine? Visit your doctor or health care professional for regular checks on your progress. Tell your doctor or healthcare professional if your symptoms do not start to get better or if they get worse. You will be tested for tuberculosis (TB) before you start this medicine. If your doctor prescribes any medicine  for TB, you should start taking the TB medicine before starting this medicine. Make sure to finish the full course of TB medicine. Call your doctor or health care professional if you get a cold or other infection while receiving this medicine. Do not treat yourself. This medicine may decrease your body's ability to fight infection. Talk to your doctor about your risk of cancer. You may be more at risk for certain types of cancers if you take this medicine. What side effects may I notice from receiving this medicine? Side effects that you should report to your doctor or health care professional as soon as possible:  allergic reactions like skin rash, itching or hives, swelling of the face, lips, or tongue  breathing problems  changes in vision  chest pain  joint or muscle pain  mouth sores  numbness or tingling in any part of your body  red, scaly patches or raised bumps on the skin  signs and symptoms of infection like fever or chills; cough; sore throat; pain or trouble passing urine  signs and symptoms of liver injury like dark yellow or brown urine; general ill feeling or flu-like symptoms; light-colored stools; loss of appetite; nausea; right upper belly pain; unusually weak or tired; yellowing of the eyes or skin  swelling of the legs or ankles  swollen lymph nodes in the neck, underarm, or groin areas  unexplained weight loss  unusual bleeding or bruising  unusually weak or tired Side effects that usually do not require medical attention (report to your doctor or health care professional if they continue or are bothersome):  dizziness  increased blood pressure  loss of appetite  nausea, vomiting  redness, itching, swelling, or bruising at site where injected  runny nose This list may not describe all possible side effects. Call your doctor for medical advice about side effects. You may report side effects to FDA at 1-800-FDA-1088. Where should I keep my  medicine? Infusions will be given in a hospital or clinic and will not be stored at home. Storage for syringes or injectors given under the skin and stored at home: Keep out of the reach of children. Store in a refrigerator between 2 and 8 degrees C (36 and 46 degrees F). If needed, the syringes or injectors may be stored at room temperature up to 25 degrees C (77 degrees F) for up to 30 days. Do not put the medicine back in the refrigerator after it reaches room temperature. Keep this medicine in the original container. Protect from light. Do not freeze. Do not shake. Throw away any medicine that has been kept at room temperature for 30 days and has not been used. Throw away any unused medicine after the expiration date. NOTE: This sheet is a summary. It may not cover all possible information. If you have questions about this medicine, talk to your doctor, pharmacist, or health care provider.  2020 Elsevier/Gold Standard (2019-05-02 13:33:40)  

## 2020-02-13 NOTE — Progress Notes (Signed)
CBC WNL

## 2020-02-13 NOTE — Telephone Encounter (Signed)
Findings of benefits investigation for Simponi IV- [id number] :   Insurance: [employer] Medicare [place]   Phone:  [phone]  Plan is ACTIVE  Plan follows Medicare guidelines and covers 80% of the infusion and no authorization is required for J1602. Patient has an HMO plan all providers must be in network. [facility] is in Network. Patient will be required to pay the 20% coinsurance per each infusion, which will be applied to [facility] [employer]ocket- $4,000. Patient has met $0.00 as of 02/13/2020.  [place] code [phone] does not require a precert and is covered at 100%  RepGeorgie Chard Ref# [id number]

## 2020-02-17 ENCOUNTER — Ambulatory Visit: Payer: Medicare Other | Admitting: Rheumatology

## 2020-02-17 ENCOUNTER — Other Ambulatory Visit: Payer: Self-pay | Admitting: Pharmacist

## 2020-02-17 DIAGNOSIS — Z79899 Other long term (current) drug therapy: Secondary | ICD-10-CM

## 2020-02-17 DIAGNOSIS — L4050 Arthropathic psoriasis, unspecified: Secondary | ICD-10-CM

## 2020-02-17 NOTE — Progress Notes (Signed)
Patient will be starting Simponi Aria 2 m MG/KG at weeks 0, 4 and then every 8 weeks.  Orders placed for Simponi Aria 2 mg/KG every 28 days x 2 doses for loading dose along with premedication of Tylenol and Benadryl.  Orders also placed for CBC with differential/CMP every 2 months starting with second infusion x 5.  Patient has history of positive TB and treated.  Last HRCT on 05/31/2019 showed no active disease.  Repeat chest x-ray or HRCT to October 2021.  Verlin Fester, PharmD, [place], CPP Clinical Specialty Pharmacist (Rheumatology and Pulmonology)  02/17/2020 11:02 AM

## 2020-02-17 NOTE — Telephone Encounter (Signed)
Patient advised Simponi Aria infusions approved and covered 100%.  Orders placed for Simponi Aria at 0,4 weeks.  Patient advised to call [facility] infusion [phone] to schedule infusion.

## 2020-02-17 NOTE — Telephone Encounter (Signed)
Simponi Aria infusions approved and covered 100%.  Orders placed for Simponi Aria at 0,4 weeks.  Patient can call [facility] infusion [phone] to schedule infusion.

## 2020-02-19 ENCOUNTER — Telehealth: Payer: Self-pay | Admitting: Rheumatology

## 2020-02-19 NOTE — Telephone Encounter (Signed)
Patient's daughter [name] called stating she has questions regarding the Simponi infusion her dad is scheduling.  [name] requested a return call at #[phone]

## 2020-02-20 NOTE — Telephone Encounter (Signed)
Returned call and answered concerns about Simponi Aria side effects and risk of infection.  They are still willing to proceed with Simponi infusions and he is scheduled to receive first infusion on 02/27/20.  He will need follow up with our office in September.  [name] will have her father call to schedule appointment.  All questions encouraged and answered.  Instructed patient and/or daughter call with any other questions or concerns.   Verlin Fester, PharmD, [place], CPP Clinical Specialty Pharmacist (Rheumatology and Pulmonology)  02/20/2020 10:25 AM

## 2020-02-27 ENCOUNTER — Ambulatory Visit (HOSPITAL_COMMUNITY)
Admission: RE | Admit: 2020-02-27 | Discharge: 2020-02-27 | Disposition: A | Discharge status: Home / Self Care | Payer: Medicare Other | Source: Ambulatory Visit | Attending: Rheumatology | Admitting: Rheumatology

## 2020-02-27 DIAGNOSIS — L4050 Arthropathic psoriasis, unspecified: Secondary | ICD-10-CM | POA: Diagnosis present

## 2020-02-27 DIAGNOSIS — Z79899 Other long term (current) drug therapy: Secondary | ICD-10-CM

## 2020-02-27 LAB — COMPREHENSIVE METABOLIC PANEL
ALT: 19 U/L (ref 0–44)
AST: 36 U/L (ref 15–41)
Albumin: 3.5 g/dL (ref 3.5–5.0)
Alkaline Phosphatase: 92 U/L (ref 38–126)
Anion gap: 10 (ref 5–15)
BUN: 12 mg/dL (ref 8–23)
CO2: 25 mmol/L (ref 22–32)
Calcium: 9.1 mg/dL (ref 8.9–10.3)
Chloride: 102 mmol/L (ref 98–111)
Creatinine, Ser: 1.13 mg/dL (ref 0.61–1.24)
GFR calc Af Amer: >60 mL/min (ref >60)
GFR calc non Af Amer: >60 mL/min (ref >60)
Glucose, Bld: 112 mg/dL — ABNORMAL HIGH (ref 70–99)
Potassium: 4.2 mmol/L (ref 3.5–5.1)
Sodium: 137 mmol/L (ref 135–145)
Total Bilirubin: 1.1 mg/dL (ref 0.3–1.2)
Total Protein: 7.1 g/dL (ref 6.5–8.1)

## 2020-02-27 LAB — CBC WITH DIFFERENTIAL/PLATELET
Abs Immature Granulocytes: 0.02 10*3/uL (ref 0.00–0.07)
Basophils Absolute: 0 10*3/uL (ref 0.0–0.1)
Basophils Relative: 0 %
Eosinophils Absolute: 0.1 10*3/uL (ref 0.0–0.5)
Eosinophils Relative: 2 %
HCT: 41.3 % (ref 39.0–52.0)
Hemoglobin: 14.1 g/dL (ref 13.0–17.0)
Immature Granulocytes: 0 %
Lymphocytes Relative: 20 %
Lymphs Abs: 1.2 10*3/uL (ref 0.7–4.0)
MCH: 32.3 pg (ref 26.0–34.0)
MCHC: 34.1 g/dL (ref 30.0–36.0)
MCV: 94.7 fL (ref 80.0–100.0)
Monocytes Absolute: 0.6 10*3/uL (ref 0.1–1.0)
Monocytes Relative: 10 %
Neutro Abs: 4 10*3/uL (ref 1.7–7.7)
Neutrophils Relative %: 68 %
Platelets: 180 10*3/uL (ref 150–400)
RBC: 4.36 MIL/uL (ref 4.22–5.81)
RDW: 11.5 % (ref 11.5–15.5)
WBC: 6 10*3/uL (ref 4.0–10.5)
nRBC: 0 % (ref 0.0–0.2)

## 2020-02-27 MED ORDER — DIPHENHYDRAMINE HCL 25 MG PO CAPS
ORAL_CAPSULE | ORAL | Status: AC
  Administered 2020-02-27: 25 mg via ORAL
  Filled 2020-02-27: qty 1

## 2020-02-27 MED ORDER — SODIUM CHLORIDE 0.9 % IV SOLN
2.0000 mg/kg | INTRAVENOUS | Status: DC
  Administered 2020-02-27: 141.25 mg via INTRAVENOUS
  Filled 2020-02-27: qty 11.3

## 2020-02-27 MED ORDER — DIPHENHYDRAMINE HCL 25 MG PO CAPS: 25.0000 mg | ORAL_CAPSULE | Freq: Once | ORAL | Status: DC

## 2020-02-27 MED ORDER — ACETAMINOPHEN 325 MG PO TABS
ORAL_TABLET | ORAL | Status: AC
  Administered 2020-02-27: 650 mg via ORAL
  Filled 2020-02-27: qty 2

## 2020-02-27 MED ORDER — ACETAMINOPHEN 325 MG PO TABS: 650.0000 mg | ORAL_TABLET | Freq: Once | ORAL | Status: DC

## 2020-02-27 NOTE — Discharge Instructions (Signed)
Golimumab injection (subcutaneous or intravenous use) What is this medicine? GOLIMUMAB (goe LIM ue mab) is used to treat rheumatoid arthritis, psoriatic arthritis, polyarticular juvenile idiopathic arthritis, and ankylosing spondylitis. It is also used to treat ulcerative colitis. This medicine may be used for other purposes; ask your health care provider or pharmacist if you have questions. COMMON BRAND NAME(S): Simponi, SIMPONI ARIA What should I tell my health care provider before I take this medicine? They need to know if you have any of these conditions:  cancer  diabetes  Guillain-Barre syndrome  heart failure  hepatitis B or history of hepatitis B infection  immune system problems  infection or history of infections  low blood counts like low white cell, platelet, or red cell counts  multiple sclerosis  psoriasis  recently received or scheduled to receive a vaccine  tuberculosis, a positive skin test for tuberculosis or have recently been in close contact with someone who has tuberculosis  an unusual reaction to golimumab, other medicines, latex, rubber, foods, dyes, or preservatives  pregnant or trying to get pregnant  breast-feeding How should I use this medicine? This medicine is for infusion into a vein or for injection under the skin. Infusions are given by a health care professional in a hospital or clinic setting. If you are to give your own medicine at home, you will be taught how to prepare and give this medicine under the skin. Use exactly as directed. Take your medicine at regular intervals. Do not take your medicine more often than directed. It is important that you put your used injectors, needles and syringes in a special sharps container. Do not put them in a trash can. If you do not have a sharps container, call your pharmacist or healthcare provider to get one. A special MedGuide will be given to you by the pharmacist with each prescription and refill.  Be sure to read this information carefully each time. Talk to your pediatrician regarding the use of this medicine in children. While this drug may be prescribed for children as young as 2 years for selected conditions, precautions do apply. Overdosage: If you think you have taken too much of this medicine contact a poison control center or emergency room at once. NOTE: This medicine is only for you. Do not share this medicine with others. What if I miss a dose? If you give your medicine by injection under the skin: If you miss a dose, take it as soon as you can. If it is almost time for your next dose, take only that dose. Do not take double or extra doses. Call your doctor or health care professional if you are not sure how to handle a missed dose. If you are to be given an infusion: It is important not to miss your dose. Call your doctor or health care professional if you are unable to keep an appointment. What may interact with this medicine? Do not take this medicine with any of the following medications:  abatacept  adalimumab  anakinra  certolizumab  etanercept  infliximab  live virus vaccines  rituximab  rilonacept  tocilizumab This medicine may also interact with the following medications:  cyclosporine  theophylline  vaccines  warfarin This list may not describe all possible interactions. Give your health care provider a list of all the medicines, herbs, non-prescription drugs, or dietary supplements you use. Also tell them if you smoke, drink alcohol, or use illegal drugs. Some items may interact with your medicine. What should I   watch for while using this medicine? Visit your doctor or health care professional for regular checks on your progress. Tell your doctor or healthcare professional if your symptoms do not start to get better or if they get worse. You will be tested for tuberculosis (TB) before you start this medicine. If your doctor prescribes any medicine  for TB, you should start taking the TB medicine before starting this medicine. Make sure to finish the full course of TB medicine. Call your doctor or health care professional if you get a cold or other infection while receiving this medicine. Do not treat yourself. This medicine may decrease your body's ability to fight infection. Talk to your doctor about your risk of cancer. You may be more at risk for certain types of cancers if you take this medicine. What side effects may I notice from receiving this medicine? Side effects that you should report to your doctor or health care professional as soon as possible:  allergic reactions like skin rash, itching or hives, swelling of the face, lips, or tongue  breathing problems  changes in vision  chest pain  joint or muscle pain  mouth sores  numbness or tingling in any part of your body  red, scaly patches or raised bumps on the skin  signs and symptoms of infection like fever or chills; cough; sore throat; pain or trouble passing urine  signs and symptoms of liver injury like dark yellow or brown urine; general ill feeling or flu-like symptoms; light-colored stools; loss of appetite; nausea; right upper belly pain; unusually weak or tired; yellowing of the eyes or skin  swelling of the legs or ankles  swollen lymph nodes in the neck, underarm, or groin areas  unexplained weight loss  unusual bleeding or bruising  unusually weak or tired Side effects that usually do not require medical attention (report to your doctor or health care professional if they continue or are bothersome):  dizziness  increased blood pressure  loss of appetite  nausea, vomiting  redness, itching, swelling, or bruising at site where injected  runny nose This list may not describe all possible side effects. Call your doctor for medical advice about side effects. You may report side effects to FDA at 1-800-FDA-1088. Where should I keep my  medicine? Infusions will be given in a hospital or clinic and will not be stored at home. Storage for syringes or injectors given under the skin and stored at home: Keep out of the reach of children. Store in a refrigerator between 2 and 8 degrees C (36 and 46 degrees F). If needed, the syringes or injectors may be stored at room temperature up to 25 degrees C (77 degrees F) for up to 30 days. Do not put the medicine back in the refrigerator after it reaches room temperature. Keep this medicine in the original container. Protect from light. Do not freeze. Do not shake. Throw away any medicine that has been kept at room temperature for 30 days and has not been used. Throw away any unused medicine after the expiration date. NOTE: This sheet is a summary. It may not cover all possible information. If you have questions about this medicine, talk to your doctor, pharmacist, or health care provider.  2020 Elsevier/Gold Standard (2019-05-02 13:33:40)  

## 2020-02-28 NOTE — Progress Notes (Signed)
CBC/CMP within normal limits. He started Simponi IV. Will need CBC/CMP with next infusion to monitor for drug toxicity.

## 2020-03-16 ENCOUNTER — Telehealth: Payer: Self-pay | Admitting: Rheumatology

## 2020-03-16 NOTE — Telephone Encounter (Signed)
Patient's daughter calling to request more information concerning booster shot. Patient is having infusions, and they would like to know if booster would interfere with infusion? Please call to advise.

## 2020-03-16 NOTE — Telephone Encounter (Signed)
Please review the following recommendations. He is on Simponi Aria IV infusions. ACR does not recommend modifying therapy or vaccination timing for patients on TNF inhibitors.  We do recommend receiving the covid-19 booster since he is immunosuppressed.    COVID-19 vaccine recommendations:   COVID-19 vaccine is recommended for everyone (unless you are allergic to a vaccine component), even if you are on a medication that suppresses your immune system.   If you are on Methotrexate, Cellcept (mycophenolate), Rinvoq, Harriette Ohara, and Olumiant- hold the medication for 1 week after each vaccine. Hold Methotrexate for 2 weeks after the single dose COVID-19 vaccine.   If you are on Orencia subcutaneous injection - hold medication one week prior to and one week after the first COVID-19 vaccine dose (only).   If you are on Orencia IV infusions- time vaccination administration so that the first COVID-19 vaccination will occur four weeks after the infusion and postpone the subsequent infusion by one week.   If you are on Cyclophosphamide or Rituxan infusions please contact your doctor prior to receiving the COVID-19 vaccine.   Do not take Tylenol or ant anti-inflammatory medications (NSAIDs) 24 hours prior to the COVID-19 vaccination.   There is no direct evidence about the efficacy of the COVID-19 vaccine in individuals who are on medications that suppress the immune system.   Even if you are fully vaccinated, and you are on any medications that suppress your immune system, please continue to wear a mask, maintain at least six feet social distance and practice hand hygiene.   If you develop a COVID-19 infection, please contact your PCP or our office to determine if you need antibody infusion.  The booster vaccine is now available for immunocompromised patients. It is advised that if you had Pfizer vaccine you should get [employer] booster. If you had a Moderna vaccine then you should get a Moderna booster.  [employer]es does not have a booster vaccine at this time.  Please see the following web sites for updated information.   [email or web address]  [email or web address]

## 2020-03-17 NOTE — Telephone Encounter (Signed)
Patient's daughter advised  advise the patient to schedule an appointment with his PCP or dermatologist for further evaluation. She will advise patient and have him schedule appointment.

## 2020-03-17 NOTE — Telephone Encounter (Signed)
Please advise the patient to schedule an appointment with his PCP or dermatologist for further evaluation.  He has a known history of psoriasis.

## 2020-03-17 NOTE — Telephone Encounter (Signed)
Patient's daughter advised:   He is on Simponi Aria IV infusions. ACR does not recommend modifying therapy or vaccination timing for patients on TNF inhibitors.  We do recommend receiving the covid-19 booster since he is immunosuppressed.    COVID-19 vaccine recommendations:   COVID-19 vaccine is recommended for everyone (unless you are allergic to a vaccine component), even if you are on a medication that suppresses your immune system.   Do not take Tylenol or ant anti-inflammatory medications (NSAIDs) 24 hours prior to the COVID-19 vaccination.   There is no direct evidence about the efficacy of the COVID-19 vaccine in individuals who are on medications that suppress the immune system.   Even if you are fully vaccinated, and you are on any medications that suppress your immune system, please continue to wear a mask, maintain at least six feet social distance and practice hand hygiene.   If you develop a COVID-19 infection, please contact your PCP or our office to determine if you need antibody infusion.   The booster vaccine is now available for immunocompromised patients. It is advised that if you had Pfizer vaccine you should get [employer] booster. If you had a Moderna vaccine then you should get a Moderna booster. [employer] does not have a booster vaccine at this time.

## 2020-03-17 NOTE — Telephone Encounter (Signed)
Patient's daughter states he has noticed a rash, tiny bumps, on his arms and back. Patient notice this after his infusion which was on 02/27/2020. Patient is unsure of exactly when the rash appeared. Patient states it is itching. Patient states it has dried out now. Please advise.

## 2020-03-26 ENCOUNTER — Other Ambulatory Visit: Payer: Self-pay

## 2020-03-26 ENCOUNTER — Ambulatory Visit (HOSPITAL_COMMUNITY)
Admission: RE | Admit: 2020-03-26 | Discharge: 2020-03-26 | Disposition: A | Discharge status: Home / Self Care | Payer: Medicare Other | Source: Ambulatory Visit | Attending: Rheumatology | Admitting: Rheumatology

## 2020-03-26 DIAGNOSIS — L4050 Arthropathic psoriasis, unspecified: Secondary | ICD-10-CM | POA: Insufficient documentation

## 2020-03-26 MED ORDER — ACETAMINOPHEN 325 MG PO TABS: 650.0000 mg | ORAL_TABLET | Freq: Once | ORAL | Status: DC

## 2020-03-26 MED ORDER — DIPHENHYDRAMINE HCL 25 MG PO CAPS: 25.0000 mg | ORAL_CAPSULE | Freq: Once | ORAL | Status: DC

## 2020-03-26 MED ORDER — SODIUM CHLORIDE 0.9 % IV SOLN
2.0000 mg/kg | INTRAVENOUS | Status: DC
  Administered 2020-03-26: 133.75 mg via INTRAVENOUS
  Filled 2020-03-26: qty 10.7

## 2020-04-22 ENCOUNTER — Ambulatory Visit: Payer: Medicare Other | Admitting: Pulmonary Disease

## 2020-04-23 ENCOUNTER — Inpatient Hospital Stay (HOSPITAL_COMMUNITY): Admission: RE | Admit: 2020-04-23 | Payer: Medicare Other | Source: Ambulatory Visit

## 2020-04-23 ENCOUNTER — Encounter (HOSPITAL_COMMUNITY): Payer: Self-pay | Admitting: *Deleted

## 2020-04-23 NOTE — Progress Notes (Unsigned)
Your appointment today was cancelled because it was too early for your infusion.  You have been rescheduled for 10/21 at 1000 and I left a voicemail for you on your home phone.  Your cell phone would not allow me to leave a message

## 2020-04-27 ENCOUNTER — Other Ambulatory Visit: Payer: Self-pay | Admitting: Pharmacist

## 2020-04-27 ENCOUNTER — Telehealth: Payer: Self-pay | Admitting: *Deleted

## 2020-04-27 DIAGNOSIS — A1889 Tuberculosis of other sites: Secondary | ICD-10-CM

## 2020-04-27 DIAGNOSIS — L4050 Arthropathic psoriasis, unspecified: Secondary | ICD-10-CM

## 2020-04-27 DIAGNOSIS — Z9225 Personal history of immunosupression therapy: Secondary | ICD-10-CM

## 2020-04-27 DIAGNOSIS — Z79899 Other long term (current) drug therapy: Secondary | ICD-10-CM

## 2020-04-27 NOTE — Progress Notes (Signed)
Next infusion scheduled for 10/26/21and due for updated orders.  Last Visit: 02/12/20 Next Visit: no-follow up scheduled Labs: stable 02/27/20 TB Gold: CT 05/2019   Orders placed for Simponi 2mg /kg  x 1 doses along with premedication of Tylenol and Benadryl.    Patient is due for follow up appointment prior to any additional infusion orders.  He is due to repeat chest x-ray to monitor for TB.  , PharmD, [place], CPP Clinical Specialty Pharmacist (Rheumatology and Pulmonology)  04/27/2020 11:49 AM

## 2020-04-27 NOTE — Telephone Encounter (Signed)
Patient advised he is due for a follow up visit. Patient has been scheduled for 04/29/2020 at 1:00 pm. Patient advised he is also due to update his yearly chest x-ray. Order placed.

## 2020-04-28 NOTE — Progress Notes (Signed)
Office Visit Note  Patient: Alan Roman             Date of Birth: [birth year]           MRN: [id number]             PCP: Ignatius Specking, MD Referring: Ignatius Specking, MD Visit Date: 04/29/2020 Occupation: @GUAROCC @  Subjective:  Medication monitoring.   History of Present Illness: Alan Roman is a 74 y.o. male with history of psoriatic arthritis, psoriasis and osteoarthritis.  He has been taking Simponi Aria infusions which has been working very well for him.  He denies any joint pain or joint swelling.  He does not have any psoriasis rash.  He has clobetasol cream which he uses on as needed basis.  He states his skin has been very dry and he has been using coconut oil.  Due to dryness he has been having some itching on his legs.  Has been walking 30 to 40 minutes daily.  He has had both COVID-19 vaccines and also a booster.  Activities of Daily Living:  Patient reports morning stiffness for 0 minute.   Patient Denies nocturnal pain.  Difficulty dressing/grooming: Denies Difficulty climbing stairs: Denies Difficulty getting out of chair: Denies Difficulty using hands for taps, buttons, cutlery, and/or writing: Denies  Review of Systems  Constitutional: Negative for fatigue and night sweats.  HENT: Negative for mouth sores, mouth dryness and nose dryness.   Eyes: Negative for redness and dryness.  Respiratory: Negative for shortness of breath and difficulty breathing.   Cardiovascular: Negative for chest pain, palpitations, hypertension, irregular heartbeat and swelling in legs/feet.  Gastrointestinal: Negative for constipation and diarrhea.  Endocrine: Negative for increased urination.  Musculoskeletal: Negative for arthralgias, joint pain, joint swelling, myalgias, muscle weakness, morning stiffness, muscle tenderness and myalgias.  Skin: Negative for color change, rash, hair loss, nodules/bumps, skin tightness, ulcers and sensitivity to sunlight.  Allergic/Immunologic:  Negative for susceptible to infections.  Neurological: Negative for dizziness, fainting, memory loss, night sweats and weakness ( ).  Hematological: Negative for swollen glands.  Psychiatric/Behavioral: Negative for depressed mood and sleep disturbance. The patient is not nervous/anxious.     PMFS History:  Patient Active Problem List   Diagnosis Date Noted  . At risk for aspiration 08/07/2019  . ILD (interstitial lung disease) (HCC) 06/03/2019  . Elevated diaphragm 06/03/2019  . Dyspnea on exertion 05/06/2019  . Vocal cord atrophy 05/06/2019  . History of total right knee replacement 07/17/2018  . Primary osteoarthritis of right knee 05/31/2018  . History of tuberculosis 11/05/2015  . Orthostatic hypotension 11/05/2015  . Constipation 11/05/2015  . Thrombocytopenia (HCC) 11/05/2015  . Normocytic anemia 11/05/2015  . Psoriatic arthritis (HCC) 11/03/2015  . Essential hypertension 11/03/2015  . Osteoarthritis of left knee 11/03/2015  . Status post total knee replacement, left 11/03/2015  . Psoriasis 01/23/2013  . Immunocompromised state (HCC) 01/23/2013    Past Medical History:  Diagnosis Date  . Acute bronchiolitis due to other infectious organisms 01/26/2013  . Arthritis    "knees" (11/03/2015)  . Extrapulmonary TB (tuberculosis) 02/02/2013  . High risk medication use 12/27/2016  . History of hiatal hernia 2007; 2009  . Hypertension   . Pleural effusion 01/26/2013    History reviewed. No pertinent family history. Past Surgical History:  Procedure Laterality Date  . CATARACT EXTRACTION W/ INTRAOCULAR LENS  IMPLANT, BILATERAL Bilateral   . HERNIA REPAIR    . HIATAL HERNIA REPAIR  2007; 2009  .  JOINT REPLACEMENT    . TOTAL KNEE ARTHROPLASTY Left 11/03/2015  . TOTAL KNEE ARTHROPLASTY Left 11/03/2015   Procedure: Left Total Knee Arthroplasty;  Surgeon: Valeria Batman, MD;  Location: [facility] OR;  Service: Orthopedics;  Laterality: Left;  . TOTAL KNEE ARTHROPLASTY Right 07/17/2018    Procedure: RIGHT TOTAL KNEE ARTHROPLASTY;  Surgeon: Valeria Batman, MD;  Location: [facility] OR;  Service: Orthopedics;  Laterality: Right;  Marland Kitchen VIDEO BRONCHOSCOPY Bilateral 01/31/2013   Procedure: VIDEO BRONCHOSCOPY WITHOUT FLUORO;  Surgeon: Merwyn Katos, MD;  Location: [facility] ENDOSCOPY;  Service: Cardiopulmonary;  Laterality: Bilateral;   Social History   Social History Narrative  . Not on file   Immunization History  Administered Date(s) Administered  . Influenza, High Dose Seasonal PF 04/22/2019  . PPD Test 04/06/2010  . Tdap 05/30/2017     Objective: Vital Signs: Wt 148 lb (67.1 kg)   BMI 23.18 kg/m    Physical Exam Vitals and nursing note reviewed.  Constitutional:      Appearance: He is well-developed.  HENT:     Head: Normocephalic and atraumatic.  Eyes:     Conjunctiva/sclera: Conjunctivae normal.     Pupils: Pupils are equal, round, and reactive to light.  Cardiovascular:     Rate and Rhythm: Normal rate and regular rhythm.     Heart sounds: Normal heart sounds.  Pulmonary:     Effort: Pulmonary effort is normal.     Breath sounds: Normal breath sounds.  Abdominal:     General: Bowel sounds are normal.     Palpations: Abdomen is soft.  Musculoskeletal:     Cervical back: Normal range of motion and neck supple.  Skin:    General: Skin is warm and dry.     Capillary Refill: Capillary refill takes less than 2 seconds.     Comments: He has mild erythema on his bilateral lower extremities and excoriation marks from scratching.  Neurological:     Mental Status: He is alert and oriented to person, place, and time.  Psychiatric:        Behavior: Behavior normal.      Musculoskeletal Exam: C-spine was in good range of motion.  He had no discomfort over thoracic or lumbar spine.  No SI joint tenderness was noted.  Shoulder joints, elbow joints, wrist joints, MCPs PIPs and DIPs with good range of motion with no synovitis.  Hip joints, knee joints, ankles, MTPs and PIPs with  good range of motion with no synovitis.  CDAI Exam: CDAI Score: -- Patient Global: --; Provider Global: -- Swollen: --; Tender: -- Joint Exam 04/29/2020   No joint exam has been documented for this visit   There is currently no information documented on the homunculus. Go to the Rheumatology activity and complete the homunculus joint exam.  Investigation: No additional findings.  Imaging: No results found.  Recent Labs: Lab Results  Component Value Date   WBC 6.0 02/27/2020   HGB 14.1 02/27/2020   PLT 180 02/27/2020   NA 137 02/27/2020   K 4.2 02/27/2020   CL 102 02/27/2020   CO2 25 02/27/2020   GLUCOSE 112 (H) 02/27/2020   BUN 12 02/27/2020   CREATININE 1.13 02/27/2020   BILITOT 1.1 02/27/2020   ALKPHOS 92 02/27/2020   AST 36 02/27/2020   ALT 19 02/27/2020   PROT 7.1 02/27/2020   ALBUMIN 3.5 02/27/2020   CALCIUM 9.1 02/27/2020   GFRAA >60 02/27/2020    Speciality Comments: No specialty comments available.  Procedures:  No procedures performed Allergies: Patient has no known allergies.   Assessment / Plan:     Visit Diagnoses: Psoriatic arthritis (HCC)-he is doing very well on Simponi Aria infusions.  He had no joint limitation on my examination today.  Psoriasis-he denies having psoriasis.  Pruritus-been experiencing increased itching on his bilateral lower extremities.  He states he has tried triamcinolone cream in the past which was helpful.  Per his request I will send the prescription.  Excoriation marks were noted on his bilateral lower extremities.  High risk medication use - Simponi Aria IV infusions. Discontinued Cosentyx due to insurance.  His labs are stable.  He states his Simponi Aria infusion was postponed.  He will get labs with that infusion.  ILD (interstitial lung disease) (HCC) - Dr. Vassie Loll: He had high-resolution chest CT on 05/31/2019 which revealed mild bibasilar predominant pulmonary fibrosis and pattern featuring subtle subpleural changes.   His appointment with Dr. Valetta Mole was postponed.  Status post total knee replacement, right-doing well  Status post total knee replacement, left-doing well has been walking on a regular basis.  Primary osteoarthritis of both feet-he currently does not have any discomfort.  Extrapulmonary TB (tuberculosis) - He will require updated CXR in October 2021.  He states he can go for chest x-ray.  Educated about COVID-19 virus infection-he is fully vaccinated against COVID-19.  He also received his booster.  Use of mask, social distancing and hand hygiene was discussed.  I also discussed the use of monoclonal antibody infusion in case he develops COVID-19 infection.  Orders: No orders of the defined types were placed in this encounter.  Meds ordered this encounter  Medications  . Triamcinolone Acetonide (TRIAMCINOLONE 0.1 % CREAM : EUCERIN) CREA    Sig: Apply 1 application topically 2 (two) times daily.    Dispense:  1 each    Refill:  0      Follow-Up Instructions: Return in about 4 months (around 08/29/2020) for Psoriatic arthritis, Osteoarthritis.   Pollyann Savoy, MD  Note - This record has been created using Animal nutritionist.  Chart creation errors have been sought, but may not always  have been located. Such creation errors do not reflect on  the standard of medical care.

## 2020-04-29 ENCOUNTER — Other Ambulatory Visit: Payer: Self-pay

## 2020-04-29 ENCOUNTER — Ambulatory Visit (INDEPENDENT_AMBULATORY_CARE_PROVIDER_SITE_OTHER): Payer: Medicare Other | Admitting: Rheumatology

## 2020-04-29 ENCOUNTER — Encounter: Payer: Self-pay | Admitting: Rheumatology

## 2020-04-29 VITALS — BP 114/66 | HR 65 | Resp 18 | Ht 67.0 in | Wt 148.0 lb

## 2020-04-29 DIAGNOSIS — Z79899 Other long term (current) drug therapy: Secondary | ICD-10-CM | POA: Diagnosis not present

## 2020-04-29 DIAGNOSIS — J849 Interstitial pulmonary disease, unspecified: Secondary | ICD-10-CM | POA: Diagnosis not present

## 2020-04-29 DIAGNOSIS — L4050 Arthropathic psoriasis, unspecified: Secondary | ICD-10-CM | POA: Diagnosis not present

## 2020-04-29 DIAGNOSIS — L409 Psoriasis, unspecified: Secondary | ICD-10-CM | POA: Diagnosis not present

## 2020-04-29 DIAGNOSIS — Z96652 Presence of left artificial knee joint: Secondary | ICD-10-CM

## 2020-04-29 DIAGNOSIS — L299 Pruritus, unspecified: Secondary | ICD-10-CM

## 2020-04-29 DIAGNOSIS — Z96651 Presence of right artificial knee joint: Secondary | ICD-10-CM

## 2020-04-29 DIAGNOSIS — A1889 Tuberculosis of other sites: Secondary | ICD-10-CM

## 2020-04-29 DIAGNOSIS — Z7189 Other specified counseling: Secondary | ICD-10-CM

## 2020-04-29 DIAGNOSIS — M19072 Primary osteoarthritis, left ankle and foot: Secondary | ICD-10-CM

## 2020-04-29 DIAGNOSIS — M19071 Primary osteoarthritis, right ankle and foot: Secondary | ICD-10-CM

## 2020-04-29 MED ORDER — TRIAMCINOLONE 0.1 % CREAM:EUCERIN CREAM 1:1: 1.0000 "application " | TOPICAL_CREAM | Freq: Two times a day (BID) | CUTANEOUS | 0 refills | Status: AC

## 2020-04-29 NOTE — Patient Instructions (Signed)
COVID-19 vaccine recommendations:   COVID-19 vaccine is recommended for everyone (unless you are allergic to a vaccine component), even if you are on a medication that suppresses your immune system.   There is no direct evidence about the efficacy of the COVID-19 vaccine in individuals who are on medications that suppress the immune system.   Even if you are fully vaccinated, and you are on any medications that suppress your immune system, please continue to wear a mask, maintain at least six feet social distance and practice hand hygiene.   If you develop a COVID-19 infection, please contact your PCP or our office to determine if you need antibody infusion.   [email or web address]

## 2020-05-21 ENCOUNTER — Other Ambulatory Visit: Payer: Self-pay

## 2020-05-21 ENCOUNTER — Ambulatory Visit (HOSPITAL_COMMUNITY)
Admission: RE | Admit: 2020-05-21 | Discharge: 2020-05-21 | Disposition: A | Discharge status: Home / Self Care | Payer: Medicare Other | Source: Ambulatory Visit | Attending: Rheumatology | Admitting: Rheumatology

## 2020-05-21 ENCOUNTER — Telehealth: Payer: Self-pay

## 2020-05-21 DIAGNOSIS — Z79899 Other long term (current) drug therapy: Secondary | ICD-10-CM

## 2020-05-21 DIAGNOSIS — L4050 Arthropathic psoriasis, unspecified: Secondary | ICD-10-CM | POA: Diagnosis present

## 2020-05-21 LAB — COMPREHENSIVE METABOLIC PANEL
ALT: 25 U/L (ref 0–44)
AST: 35 U/L (ref 15–41)
Albumin: 3.5 g/dL (ref 3.5–5.0)
Alkaline Phosphatase: 76 U/L (ref 38–126)
Anion gap: 8 (ref 5–15)
BUN: 17 mg/dL (ref 8–23)
CO2: 27 mmol/L (ref 22–32)
Calcium: 8.6 mg/dL — ABNORMAL LOW (ref 8.9–10.3)
Chloride: 106 mmol/L (ref 98–111)
Creatinine, Ser: 1.11 mg/dL (ref 0.61–1.24)
GFR, Estimated: >60 mL/min (ref >60)
Glucose, Bld: 83 mg/dL (ref 70–99)
Potassium: 4.2 mmol/L (ref 3.5–5.1)
Sodium: 141 mmol/L (ref 135–145)
Total Bilirubin: 1 mg/dL (ref 0.3–1.2)
Total Protein: 6.9 g/dL (ref 6.5–8.1)

## 2020-05-21 LAB — CBC WITH DIFFERENTIAL/PLATELET
Abs Immature Granulocytes: 0.03 10*3/uL (ref 0.00–0.07)
Basophils Absolute: 0 10*3/uL (ref 0.0–0.1)
Basophils Relative: 1 %
Eosinophils Absolute: 0.6 10*3/uL — ABNORMAL HIGH (ref 0.0–0.5)
Eosinophils Relative: 11 %
HCT: 39.6 % (ref 39.0–52.0)
Hemoglobin: 13.1 g/dL (ref 13.0–17.0)
Immature Granulocytes: 1 %
Lymphocytes Relative: 24 %
Lymphs Abs: 1.2 10*3/uL (ref 0.7–4.0)
MCH: 32.1 pg (ref 26.0–34.0)
MCHC: 33.1 g/dL (ref 30.0–36.0)
MCV: 97.1 fL (ref 80.0–100.0)
Monocytes Absolute: 0.6 10*3/uL (ref 0.1–1.0)
Monocytes Relative: 12 %
Neutro Abs: 2.6 10*3/uL (ref 1.7–7.7)
Neutrophils Relative %: 51 %
Platelets: 161 10*3/uL (ref 150–400)
RBC: 4.08 MIL/uL — ABNORMAL LOW (ref 4.22–5.81)
RDW: 12.2 % (ref 11.5–15.5)
WBC: 5.1 10*3/uL (ref 4.0–10.5)
nRBC: 0 % (ref 0.0–0.2)

## 2020-05-21 MED ORDER — SODIUM CHLORIDE 0.9 % IV SOLN
2.0000 mg/kg | INTRAVENOUS | Status: AC
  Administered 2020-05-21: 133.75 mg via INTRAVENOUS
  Filled 2020-05-21: qty 10.7

## 2020-05-21 MED ORDER — ACETAMINOPHEN 325 MG PO TABS: 650.0000 mg | ORAL_TABLET | Freq: Once | ORAL | Status: DC

## 2020-05-21 MED ORDER — DIPHENHYDRAMINE HCL 25 MG PO CAPS: 25.0000 mg | ORAL_CAPSULE | Freq: Once | ORAL | Status: DC

## 2020-05-21 NOTE — Telephone Encounter (Signed)
Returned patient's call and advised patient of lab results. See lab note for further details.

## 2020-05-21 NOTE — Progress Notes (Signed)
CBC and CMP normal except calcium is low.  Please advise calcium supplement or increase calcium intake in his diet.

## 2020-05-21 NOTE — Telephone Encounter (Signed)
Patient called stating he was returning a call to the office regarding his labwork.

## 2020-06-19 ENCOUNTER — Telehealth: Payer: Self-pay

## 2020-06-19 ENCOUNTER — Telehealth: Payer: Self-pay | Admitting: *Deleted

## 2020-06-19 DIAGNOSIS — L409 Psoriasis, unspecified: Secondary | ICD-10-CM

## 2020-06-19 NOTE — Telephone Encounter (Signed)
Patient called requesting to speak with Dr. Fatima Sanger nurse or PA directly.  Patient requested a return call.

## 2020-06-19 NOTE — Telephone Encounter (Signed)
Attempted to contact the patient and unable to leave message, voicemail is full.  

## 2020-06-19 NOTE — Telephone Encounter (Signed)
Patient contacted the office requesting a referral to the dermatologist.   Okay to refill send referral per Dr. Corliss Skains

## 2020-06-29 ENCOUNTER — Telehealth: Payer: Self-pay | Admitting: Rheumatology

## 2020-06-29 NOTE — Telephone Encounter (Signed)
Patient advised per Dr. Corliss Skains  we can apply for Altamease Oiler which is in the same family as Cosentyx. His insurance quit covering Cosentyx in the past. Patient is in agreement.  Rachael/Devki: Please apply for Taltz.

## 2020-06-29 NOTE — Telephone Encounter (Signed)
Patient scheduled for next Simpsoni infusion, but patient does not feel like it is not working. Patient has lots of places on legs, and hands, and back. Patient feels like he needs to go back to Cosentyx. Please call to advise.

## 2020-06-29 NOTE — Telephone Encounter (Signed)
Please advise patient that we can apply for Altamease Oiler which is in the same family as Cosentyx.  If he is in agreement then we can apply for Altamease Oiler and see the insurance coverage.  His insurance quit covering Cosentyx in the past.

## 2020-06-30 ENCOUNTER — Telehealth: Payer: Self-pay | Admitting: Pharmacist

## 2020-06-30 NOTE — Telephone Encounter (Signed)
Submitted a Prior Authorization request to [facility] for TALTZ via Cover My Meds. Will update once we receive a response.  Patient has previously tried/failed Humira, Enbrel, Otezla, Cosentyx, and is currently on Simponi infusions.  (Key: [id number])  Chesley Mires, PharmD, MPH Clinical Pharmacist (Rheumatology and Pulmonology)

## 2020-06-30 NOTE — Telephone Encounter (Signed)
Submitted a Prior Authorization request to [employer] for TALTZ via Cover My Meds. Will update once we receive a response. ° °Patient has previously tried/failed Humira, Enbrel, Otezla, Cosentyx, and is currently on Simponi infusions. ° °(Key: [id number]) ° °Patrik Turnbaugh, PharmD, MPH °Clinical Pharmacist (Rheumatology and Pulmonology) ° ° °

## 2020-07-01 NOTE — Telephone Encounter (Signed)
Received notification from [facility] regarding a prior authorization for TALTZ. Authorization has been APPROVED from 06/30/20 to 06/30/21.   Phone # [phone]  Chesley Mires, PharmD, MPH Clinical Pharmacist (Rheumatology and Pulmonology)

## 2020-07-02 NOTE — Telephone Encounter (Signed)
Called patient to discuss copay and patient assistance program. He states that he saw Dr. Debby Freiberg this morning at [facility] where an injectable medication with every 12-week doses was discussed; patient couldn't recall name of medication. Their clinic was going to look into approval.  I left VM with nurse line at clinic for further details about next steps. Advised patient to find household income in the meantime as Secretary/administrator Patient Assistance income cutoff for Altamease Oiler is [phone] for two-person household.  Chesley Mires, PharmD, MPH Clinical Pharmacist (Rheumatology and Pulmonology)

## 2020-07-02 NOTE — Telephone Encounter (Signed)
Ran test claim, patient's copay for 1st month supply is $2,164.20. Patient can try to apply for [facility] Patient Assistance.

## 2020-07-16 ENCOUNTER — Inpatient Hospital Stay (HOSPITAL_COMMUNITY): Admission: RE | Admit: 2020-07-16 | Payer: Medicare Other | Source: Ambulatory Visit

## 2020-09-01 ENCOUNTER — Other Ambulatory Visit: Payer: Self-pay

## 2020-09-01 DIAGNOSIS — I10 Essential (primary) hypertension: Secondary | ICD-10-CM

## 2020-09-01 MED ORDER — LOSARTAN POTASSIUM 25 MG PO TABS: 25.0000 mg | ORAL_TABLET | Freq: Every day | ORAL | 0 refills | Status: DC

## 2020-09-03 ENCOUNTER — Other Ambulatory Visit: Payer: Self-pay

## 2020-09-03 DIAGNOSIS — I10 Essential (primary) hypertension: Secondary | ICD-10-CM

## 2020-09-03 MED ORDER — LOSARTAN POTASSIUM 25 MG PO TABS: 25.0000 mg | ORAL_TABLET | Freq: Every day | ORAL | 0 refills | Status: DC

## 2020-09-07 ENCOUNTER — Ambulatory Visit: Payer: Medicare Other | Admitting: Cardiology

## 2020-09-17 ENCOUNTER — Ambulatory Visit: Payer: Medicare Other | Admitting: Cardiology

## 2020-09-25 ENCOUNTER — Other Ambulatory Visit: Payer: Self-pay

## 2020-09-25 ENCOUNTER — Ambulatory Visit: Payer: Medicare Other | Admitting: Cardiology

## 2020-09-25 ENCOUNTER — Encounter: Payer: Self-pay | Admitting: Cardiology

## 2020-09-25 VITALS — BP 123/73 | HR 69 | Temp 98.6°F | Resp 16 | Ht 67.0 in | Wt 149.6 lb

## 2020-09-25 DIAGNOSIS — I10 Essential (primary) hypertension: Secondary | ICD-10-CM

## 2020-09-25 DIAGNOSIS — J849 Interstitial pulmonary disease, unspecified: Secondary | ICD-10-CM

## 2020-09-25 DIAGNOSIS — I2510 Atherosclerotic heart disease of native coronary artery without angina pectoris: Secondary | ICD-10-CM

## 2020-09-25 DIAGNOSIS — E785 Hyperlipidemia, unspecified: Secondary | ICD-10-CM

## 2020-09-25 MED ORDER — ROSUVASTATIN CALCIUM 5 MG PO TABS: 5.0000 mg | ORAL_TABLET | Freq: Every day | ORAL | 3 refills | Status: DC

## 2020-09-25 NOTE — Progress Notes (Signed)
Primary Physician/Referring:  Ignatius Specking, MD  Patient ID: Alan Roman, male    DOB: April 11, 1946, 75 y.o.   MRN: [id number]  Chief Complaint  Patient presents with  . Hypertension  . Follow-up    1 year  . Hyperlipidemia   HPI:    Alan Roman  is a 75 y.o. [place] male with history of hypertension, psoriatic arthritis and follows Dr. Pollyann Savoy, very mild hyperlipidemia and coronary calcificaiton by CT chest, recent diagnosis of IPF in Dec 2020, hypertension.  He presents here for annual visit.  States that he is doing remarkably well, he has not had any significant joint issues.  Dyspnea has remained stable, except for cough he has no other specific symptoms today.  Past Medical History:  Diagnosis Date  . Acute bronchiolitis due to other infectious organisms 01/26/2013  . Arthritis    "knees" (11/03/2015)  . Extrapulmonary TB (tuberculosis) 02/02/2013  . High risk medication use 12/27/2016  . History of hiatal hernia 2007; 2009  . Hypertension   . Pleural effusion 01/26/2013   Past Surgical History:  Procedure Laterality Date  . CATARACT EXTRACTION W/ INTRAOCULAR LENS  IMPLANT, BILATERAL Bilateral   . HERNIA REPAIR    . HIATAL HERNIA REPAIR  2007; 2009  . JOINT REPLACEMENT    . TOTAL KNEE ARTHROPLASTY Left 11/03/2015  . TOTAL KNEE ARTHROPLASTY Left 11/03/2015   Procedure: Left Total Knee Arthroplasty;  Surgeon: Valeria Batman, MD;  Location: [facility] OR;  Service: Orthopedics;  Laterality: Left;  . TOTAL KNEE ARTHROPLASTY Right 07/17/2018   Procedure: RIGHT TOTAL KNEE ARTHROPLASTY;  Surgeon: Valeria Batman, MD;  Location: [facility] OR;  Service: Orthopedics;  Laterality: Right;  Marland Kitchen VIDEO BRONCHOSCOPY Bilateral 01/31/2013   Procedure: VIDEO BRONCHOSCOPY WITHOUT FLUORO;  Surgeon: Merwyn Katos, MD;  Location: [facility] ENDOSCOPY;  Service: Cardiopulmonary;  Laterality: Bilateral;   Social History   Tobacco Use  . Smoking status: Never Smoker  . Smokeless  tobacco: Never Used  Substance Use Topics  . Alcohol use: No    ROS  Review of Systems  Cardiovascular: Negative for dyspnea on exertion and leg swelling.  Respiratory: Positive for cough.   Musculoskeletal: Positive for joint pain.  Gastrointestinal: Negative for melena.   Objective  Blood pressure 123/73, pulse 69, temperature 98.6 F (37 C), temperature source Temporal, resp. rate 16, height 5\' 7"  (1.702 m), weight 149 lb 9.6 oz (67.9 kg), SpO2 99 %.  Vitals with BMI 09/25/2020 05/21/2020 05/21/2020  Height 5\' 7"  - -  Weight 149 lbs 10 oz - 148 lbs  BMI 23.43 - -  Systolic 123 117 05/23/2020  Diastolic 73 68 72  Pulse 69 57 79     Physical Exam Neck:     Thyroid: No thyromegaly.  Cardiovascular:     Rate and Rhythm: Normal rate and regular rhythm.     Pulses: Intact distal pulses.     Heart sounds: Normal heart sounds. No murmur heard. No gallop.      Comments: No leg edema, no JVD. Pulmonary:     Effort: Pulmonary effort is normal.     Breath sounds: Rales (coarse bibasilar ronchi present) present.  Abdominal:     General: Bowel sounds are normal.     Palpations: Abdomen is soft.  Musculoskeletal:     Cervical back: Neck supple.  Skin:    General: Skin is warm and dry.    Laboratory examination:   Recent Labs    02/27/20 1008 05/21/20  1028  NA 137 141  K 4.2 4.2  CL 102 106  CO2 25 27  GLUCOSE 112* 83  BUN 12 17  CREATININE 1.13 1.11  CALCIUM 9.1 8.6*  GFRNONAA >60 >60  GFRAA >60  --    CrCl cannot be calculated (Patient's most recent lab result is older than the maximum 21 days allowed.).  CMP Latest Ref Rng & Units 05/21/2020 02/27/2020 09/11/2019  Glucose 70 - 99 mg/dL 83 662(H) 91  BUN 8 - 23 mg/dL 17 12 13   Creatinine 0.61 - 1.24 mg/dL 4.76 5.46  Sodium 135 - 145 mmol/L 141 137 138  Potassium 3.5 - 5.1 mmol/L 4.2 4.2 4.0  Chloride 98 - 111 mmol/L 106 102 103  CO2 22 - 32 mmol/L 27 25 28   Calcium 8.9 - 10.3 mg/dL 5.03) 9.1 8.9  Total Protein  6.5 - 8.1 g/dL 6.9 7.1 7.3  Total Bilirubin 0.3 - 1.2 mg/dL 1.0 1.1 0.7  Alkaline Phos 38 - 126 U/L 76 92 -  AST 15 - 41 U/L 35 36 28  ALT 0 - 44 U/L 25 19 17    CBC Latest Ref Rng & Units 05/21/2020 02/27/2020 02/12/2020  WBC 4.0 - 10.5 K/uL 5.1 6.0 6.1  Hemoglobin 13.0 - 17.0 g/dL 05/23/2020 02/29/2020 02/14/2020  Hematocrit 39.0 - 52.0 % 39.6 41.3 40.4  Platelets 150 - 400 K/uL 161 180 157    External labs:   Cholesterol, total 181.000 M 06/24/2019 HDL 46.000 M 06/24/2019 LDL 117 Triglycerides 92.000 M 06/24/2019  Hemoglobin 15.000 G/ 06/24/2019. Platelets 151.000 X 06/24/2019 Creatinine, Serum 1.060 MG/ 06/24/2019 Potassium 4.400 06/07/2019  ALT (SGPT) 18.000 IU/ 06/24/2019 TSH 1.980 06/24/2019  Medications and allergies  No Known Allergies   Current Outpatient Medications  Medication Instructions  . clobetasol cream (TEMOVATE) 0.05 % 1 application, Topical, 2 times daily  . losartan (COZAAR) 25 mg, Oral, Daily  . multivitamin-iron-minerals-folic acid (CENTRUM) chewable tablet 1 tablet, Oral, Every 3 DAYS  . rosuvastatin (CRESTOR) 5 mg, Oral, Daily  . STELARA 45 MG/0.5ML SOSY injection 1 mL, Subcutaneous, Every 3 months  . Triamcinolone Acetonide (TRIAMCINOLONE 0.1 % CREAM : EUCERIN) CREA 1 application, Topical, 2 times daily   Radiology:   Barium swallow study 06/07/2019: Schatzki ring at the GE junction, which prevents passage of a 13 mm barium tablet. No evidence of hiatal hernia or gastroesophageal reflux.   CT chest 05/31/2019: 1. There is mild, bibasilar predominant pulmonary fibrosis in a pattern featuring subtle subpleural reticular interstitial opacity, mild traction bronchiectasis and occasional areas of bronchiolectasis, for example in the lateral segment right middle lobe and right lung base (series 4, image 176). These findings are very slightly worsened in comparison to prior examination dated 11/10/2015. Findings are consistent with mild pulmonary fibrosis in a probable  UIP pattern. Findings are categorized as probable UIP per consensus guidelines  2.  Coronary artery disease. Aortic Atherosclerosis (ICD10-I70.0). 3.  Cholelithiasis.  Cardiac Studies:   Echocardiogram [11/12/2015]:  Left ventricle cavity is small. Normal global wall motion. Normal diastolic filling pattern. Calculated EF 66%. Trace mitral regurgitation. Mild to moderate tricuspid regurgitation. Borderline pulmonary hypertension. Pulmonary artery systolic pressure is estimated at 31 mm Hg. Trace pulmonic regurgitation. compared to the study done on 09/19/11, TR new.  Nuclear stress test [11/16/2015]:  1. The resting electrocardiogram demonstrated normal sinus rhythm and normal resting conduction. Poor R wave progression. Stress EKG is non diagnostic for ischemia as it is a pharmacologic stress using Lexiscan. 2. Myocardial perfusion imaging  is normal. The LV appeared to be small. Overall left ventricular systolic function was normal without regional wall motion abnormalities. The left ventricular ejection fraction was mildly reduced at 49%. This is a low risk study.  EKG:   EKG 09/25/2020: Normal sinus rhythm at rate of 69 bpm, left atrial enlargement, normal axis.  Low-voltage complexes.  No evidence of ischemia.  Probable pulmonary disease pattern.   Assessment     ICD-10-CM   1. Essential hypertension  I10 EKG 12-Lead  2. ILD (interstitial lung disease) (HCC)  J84.9 Ambulatory referral to Pulmonology  3. Coronary artery calcification seen on CAT scan  I25.10 rosuvastatin (CRESTOR) 5 MG tablet  4. Mild hyperlipidemia  E78.5 rosuvastatin (CRESTOR) 5 MG tablet    EKG 09/06/2019: Normal sinus rhythm at rate of 62 bpm, left axis deviation, left intrafascicular block. IRBBB. No evidence of ischemia. No significant change from  Prior EKG.   Meds ordered this encounter  Medications  . rosuvastatin (CRESTOR) 5 MG tablet    Sig: Take 1 tablet (5 mg total) by mouth daily.    Dispense:   90 tablet    Refill:  3    Medications Discontinued During This Encounter  Medication Reason  . Golimumab (SIMPONI ARIA IV) Error   Orders Placed This Encounter  Procedures  . Ambulatory referral to Pulmonology    Referral Priority:   Routine    Referral Type:   Consultation    Referral Reason:   Specialty Services Required    Referred to Provider:   Kalman Shan, MD    Requested Specialty:   Pulmonary Disease    Number of Visits Requested:   1  . EKG 12-Lead    Recommendations:   Alan Roman  is a 75 y.o. Asian [place] male with history of hypertension, psoriatic arthritis and follows Dr. Pollyann Savoy, very mild hyperlipidemia and coronary calcificaiton by CT chest, recent diagnosis of IPF in Dec 2020, hypertension.   His examination has not changed significantly from prior examination.  He still has coarse leathery crackles in his lungs, he needs pulmonary evaluation.  Referral made to pulmonary medicine to Dr. Marchelle Gearing for evaluation of IPF.  From cardiac standpoint he is doing well.  Coronary calcification again discussed with the patient, he is now willing to take a statin.  I started him on Crestor 5 mg daily.  Otherwise stable from cardiac standpoint, I will see him back on a as needed basis.   Yates Decamp, MD, [facility] 09/25/2020, 11:21 AM Office: [phone] Pager: [phone]

## 2020-10-07 ENCOUNTER — Other Ambulatory Visit: Payer: Self-pay

## 2020-10-07 DIAGNOSIS — I10 Essential (primary) hypertension: Secondary | ICD-10-CM

## 2020-10-07 MED ORDER — LOSARTAN POTASSIUM 25 MG PO TABS: 25.0000 mg | ORAL_TABLET | Freq: Every day | ORAL | 0 refills | Status: DC

## 2020-10-09 ENCOUNTER — Other Ambulatory Visit: Payer: Self-pay

## 2020-11-10 ENCOUNTER — Other Ambulatory Visit: Payer: Self-pay

## 2020-11-10 ENCOUNTER — Encounter: Payer: Self-pay | Admitting: Internal Medicine

## 2020-11-10 ENCOUNTER — Ambulatory Visit: Payer: Medicare Other | Admitting: Internal Medicine

## 2020-11-10 VITALS — BP 114/76 | HR 79 | Temp 97.8°F | Ht 67.0 in | Wt 152.8 lb

## 2020-11-10 DIAGNOSIS — J849 Interstitial pulmonary disease, unspecified: Secondary | ICD-10-CM | POA: Diagnosis not present

## 2020-11-10 DIAGNOSIS — Z9189 Other specified personal risk factors, not elsewhere classified: Secondary | ICD-10-CM

## 2020-11-10 NOTE — Patient Instructions (Addendum)
    ICD-10-CM   1. ILD (interstitial lung disease) (HCC)  J84.9   2. At risk for aspiration  Z91.89     Need to restage and understand your current ILD  Plan Do repeat full PFT Do HRCT supine and prone REfere [facility]   Followup  - next few to several weeks but after completing PFT and HRCT

## 2020-11-10 NOTE — Progress Notes (Signed)
NOV 2020 with Dr Vassie Loll  75 year old never smoker of [place] origin initial consult 05/2019  for evaluation of hoarseness, dry cough and congestion. He is originally from [place], [place], [place]-immigrated to the [place] in 1971 for his bachelors to [place] and then settled in [place] in 1978 and got into [employer] business.  PMH - - psoriatic arthritis by Dr. Orene Desanctis, was originally on Humira and is now on Cosentyx. - latent TB that was untreated initially and in 12/2012 he presented with a mild alveolitis on CT scan and pleural effusion that required thoracentesis, bronchoscopy was negative for AFB but he was treated under the guidance of local health department and ID with multiple drug antituberculous treatment for 3 months.  His problem lists extrapulmonary TB but on my review all AFB cultures were negative.   -07/2018 chest x-ray shows eventration of right hemidiaphragm.  - vocal cord paralysis in 04/2015 and underwent ENT evaluation for persistent dysphonia and underwent gel injection of the vocal cords at [facility] without any relief  Chief Complaint  Patient presents with  . Follow-up    1 month rov- review CT chest 10/31.  pt states his s/s are unchanged since last month, prod cough with clear mucus qam that subsides throughout the day.      Symptoms are persistent. Main issues are hoarseness, dry cough and congestion  We reviewed HRCT in detail today  CT angiogram 10/2015 -which shows mild reticular densities suggestive but not diagnostic of ILD.  HRCT 05/31/19 mild, bibasilar predominant pulmonary fibrosis in a pattern featuring subtle subpleural reticular interstitial opacity, mild traction bronchiectasis and occasional areas of Bronchiolectasis - ' probable UIP ' - slight worse   OV [name] Jan 2021  75 year old male never smoker presenting to our office today after completing pulmonary function testing.  Patient was last seen in our office in October/2020 for  initial consult with Dr. Vassie Loll.  At that office visit patient was scheduled for pulmonary function testing, lab work was completed to further evaluate patient's probable UIP on October/2020 high-resolution CT chest, patient was referred to ear nose and throat, swallow studies were also ordered on the patient.  Patient completed ENT referral in 07/02/2019 with Dr. Merril Abbe Recommendations for voice therapy, that he follow-up with gastroenterology regarding this Schatzki's ring.  Follow-up in April.  Proceed forward with pulmonary pulmonary function test.  Patient reports that he is received a telephone call to restart voice rehab but he has not done this yet as he wanted to review pulmonary function testing with our office first.  Patient swallow studies are listed below:  06/06/2019-swallow study-mild aspiration risk, no treatment recommended at this time 06/07/2019-barium swallow-Schatzki ring at the GE junction which prevents passage of a 13 mm barium tablet  Patient has a gastroenterologist Dr. Loreta Ave but he has not seen her in over a year.  Patient has no scheduled upcoming appointments.  Patient continues to follow-up with rheumatology, Dr. Corliss Skains.  Per chart review last office note was in July/2020.  Patient presenting today to review December/2020 pulmonary function test.  Those results are listed below:  07/19/2019-pulmonary function test-FVC 2.32 (63% predicted), postbronchodilator ratio 93, postbronchodilator FEV1 1.93 (69% predicted), DLCO 13.59 (43% predicted) >>>Severe restriction, moderately severe diffusion defect  Lab work from November/2020 are also listed below:  06/03/2019-ANA-1: 40, nuclear homogenous 06/03/2019-rheumatoid factor-14 06/03/2019-anti-Jo 1-less than 0.2 06/03/2019-aldolase-4.9 06/03/2019-centromere antibodies-negative 06/03/2019-antiscleroderma antibody-less than 1 - 06/03/2019-Sjogren's syndrome 8 and B-negative 06/03/2019-hypersensitivity  pneumonitis-negative 06/03/2019-sed rate 22  Mildly elevated  ANA and rheumatoid factor.  Felt to be not clinically significant.  Rest of results are negative.  Patient continues to report that he is asymptomatic.  Patient tolerates walk in office without any oxygen desaturations.  He does continue to have occasional bouts of congestion which is believed to be related to the Cosentyx.  Family has messaged rheumatology regarding this.  Tests:   07/19/2019-pulmonary function test-FVC 2.32 (63% predicted), postbronchodilator ratio 93, postbronchodilator FEV1 1.93 (69% predicted), DLCO 13.59 (43% predicted) >>>Severe restriction, moderately severe diffusion defect  07/16/2019-SARS-CoV-2-not detected  06/03/2019-ANA-1: 40, nuclear homogenous 06/03/2019-rheumatoid factor-14 06/03/2019-anti-Jo 1-less than 0.2 06/03/2019-aldolase-4.9 06/03/2019-centromere antibodies-negative 06/03/2019-antiscleroderma antibody-less than 1 - 06/03/2019-Sjogren's syndrome 8 and B-negative 06/03/2019-hypersensitivity pneumonitis-negative 06/03/2019-sed rate 22  06/01/2019-CT chest high-res mild, bibasilar predominant pulmonary fibrosis in a pattern with featuring subtle subpleural reticular interstitial opacity and mild traction bronchiectasis occasional areas of bronchiolectasis, probable UIP  06/06/2019-swallow study-mild aspiration risk, no treatment recommended at this time  06/07/2019-barium swallow-Schatzki ring at the GE junction which prevents passage of a 13 mm barium tablet  FENO:  No results found for: NITRICOXIDE  MH: Psoriatic arthritis (Dr. Corliss Skainseveshwar - prev on Humira, now on Cosentyx), latent TB that was untreated initially in 12/2012 with mild alveolitis on CT scan and pleural effusion that required thoracentesis, bronchoscopy was negative for AFB but he was treated under guidance with local health department infectious disease with multidrug antituberculosis treatment for 3 months, eventration of right  hemidiaphragm, vocal cord paralysis in September/2016 underwent ENT evaluation for persistent dysphonia and underwent [facility] without any relief Smoker/ Smoking History: Never smoker Maintenance: None   He is originally from [place] to the [place] in [place] for his bachelors to [place] and then settled in [place] in [place] and got into [employer] business.    OV 11/10/2020  Subjective:  Patient ID: Alan RichardsJAGDISHCHANDRA Ojeda, male , DOB: 06-08-1946 , age 75 y.o. , MRN: [id number] , ADDRESS: [address] [place] [id number] PCP Ignatius SpeckingVyas, Dhruv B, MD Patient Care Team: Ignatius SpeckingVyas, Dhruv B, MD as PCP - General (Internal Medicine)  This Provider for this visit: Treatment Team:  Attending Provider: Kalman Shanamaswamy, Jayzon Taras, MD    11/10/2020 -   Chief Complaint  Patient presents with  . Follow-up    Increased coughing for past 3 months, sometimes productive with whitish sputum. Denies shortness of breath.      HPI Frandy Ocige IncDHEKAR 75 y.o. -asked by Dr. Jacinto HalimGanji to establish at the pulmonary fibrosis center.  Therefore patient is seeing Dr. Marchelle Gearingamaswamy.  Patient used to be previously followed by Dr. Vassie LollAlva.  Last seen by Dr. Vassie LollAlva in 2020.  Then seen by nurse practitioner Elisha HeadlandBrian Mack in January 2021.   His daughtre Ilene QuaKirty later joined on the phone call.  Patient is known to have ILD probable UIP pattern.  Is on observation at this point.   [facility] Comprehensive ILD Questionnaire  Symptoms:  -   he reports insidious onset of dyspnea for the last 4 months.  Since it started it is the same.  Although his ILD is present even before that.  He says that he has a very mild cough however in the last 4 to 6 months it is worse.  Particularly when he talks for a long time.  He does not wake up in the middle of the night because of the cough he does bring up some white sputum that is very light white.  Cough does not affect his voice.  He does clear his  throat.  He has never had hemoptysis.  He does not cough when he lies down.  No nausea vomiting or diarrhea.  No wheezing.  Cough does get worse with eating   SYMPTOM SCALE - ILD 11/10/2020   O2 use ra  Shortness of Breath 0 -> 5 scale with 5 being worst (score 6 If unable to do)  At rest 0  Simple tasks - showers, clothes change, eating, shaving 0  Household (dishes, doing bed, laundry) 0  Shopping 0  Walking level at own pace 1  Walking up Stairs 1  Total (30-36) Dyspnea Score 2  How bad is your cough? 1  How bad is your fatigue x  How bad is nausea 0  How bad is vomiting?  0  How bad is diarrhea? 0  How bad is anxiety? x  How bad is depression x       Past Medical History : He has psoriatic arthritis.  He is immunosuppressed and is on immunomodulators.  Currently on Simponi.  He does not have rheumatoid arthritis.  No heart failure.  No other collagen vascular disease of connective tissue disease.  No COPD.  No asthma.  No HIV.  No hiatal hernia.  No pulmonary hypertension.  No diabetes.  No thyroid disease.  No stroke no seizures.  No tuberculosis no kidney disease no pneumonia.  No blood clots no heart disease no pleurisy.   He is deemed to be an aspiration risk he has vocal cord paralysis.  He is to see Dr. Loreta Ave GI.  He wants to switch to [facility] but because of the pandemic has not been able to establish.  ROS: He does not have any fatigue.  No arthralgia.  No  dysphagia.  No Raynaud's.  No dry eyes no fever no weight loss.  No nausea no vomiting.  No heartburn no snoring no rash no ulcers.   FAMILY HISTORY of LUNG DISEASE: No pulmonary fibrosis.  No COPD.  No asthma no sarcoid no cystic fibrosis no hypersensitive pneumonitis no autoimmune disease in the family.    EXPOSURE HISTORY: No tobacco.  No smoking cigars no smoking pipes.  No electronic cigarette use.  No vaping marijuana.  No cocaine use.  No intravenous drug use.   HOME and HOBBY DETAILS : He lives in a  76 year old family home.  He is lived there for 40 years.  No damp environment.  No mold or mildew.  No humidifier use no CPAP use no nebulizer use.  No steam iron use.  No Jacuzzi use.  No misting Fountain.  No pet birds or parakeets.  No feather pillows or blankets.  No mold in the [facility] duct.  No music habits no gardening habits.  Never exposed to birds or chickens.  No water damage in the basement.  No straw meds.  No hot tub use.  No animals at work.    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  Organic antigen exposure history is negative.  Inorganic antigen exposure history is negative.   PULMONARY TOXICITY HISTORY (27 items):  Simponi for psoriatic arthritis      Simple office walk 185 feet x  3 laps goal with forehead probe 11/10/2020   O2 used ra  Number laps completed 3  Comments about pace avg  Resting Pulse Ox/HR 100% and 66/min  Final Pulse Ox/HR 100% and 103/min  Desaturated </= 88% no  Desaturated <= 3% points no  Got Tachycardic >/= 90/min yes  Symptoms at end of test none  Miscellaneous comments x       PFT  PFT Results Latest Ref Rng & Units 07/19/2019  FVC-Pre L 2.32  FVC-Predicted Pre % 63  FVC-Post L 2.06  FVC-Predicted Post % 56  Pre FEV1/FVC % % 83  Post FEV1/FCV % % 93  FEV1-Pre L 1.94  FEV1-Predicted Pre % 69  FEV1-Post L 1.93  DLCO uncorrected ml/min/mmHg 13.59  DLCO UNC% % 43  DLVA Predicted % 90  TLC L 3.80  TLC % Predicted % 55  RV % Predicted % 60    HRCT Oct 2020   IMPRESSION: 1. There is mild, bibasilar predominant pulmonary fibrosis in a pattern featuring subtle subpleural reticular interstitial opacity, mild traction bronchiectasis and occasional areas of bronchiolectasis, for example in the lateral segment right middle lobe and right lung base (series 4, image 176). These findings are very slightly worsened in comparison to prior examination dated 11/10/2015. Findings are consistent  with mild pulmonary fibrosis in a probable UIP pattern. Findings are categorized as probable UIP per consensus guidelines: Diagnosis of Idiopathic Pulmonary Fibrosis: An Official ATS/ERS/JRS/ALAT Clinical Practice Guideline. Am Rosezetta Schlatter Crit Care Med Vol 198, Iss 5, [phone], Apr 01 2017.  2.  Coronary artery disease. Aortic Atherosclerosis (ICD10-I70.0).  3.  Cholelithiasis.   Electronically Signed   By: Lauralyn Primes M.D.   On: 06/01/2019 13:26    has a past medical history of Acute bronchiolitis due to other infectious organisms (01/26/2013), Arthritis, Extrapulmonary TB (tuberculosis) (02/02/2013), High risk medication use (12/27/2016), History of hiatal hernia (2007; 2009), Hypertension, and Pleural effusion (01/26/2013).   reports that he has never smoked. He has never used smokeless tobacco.  Past Surgical History:  Procedure Laterality Date  . CATARACT EXTRACTION W/ INTRAOCULAR LENS  IMPLANT, BILATERAL Bilateral   . HERNIA REPAIR    . HIATAL HERNIA REPAIR  2007; 2009  . JOINT REPLACEMENT    . TOTAL KNEE ARTHROPLASTY Left 11/03/2015  . TOTAL KNEE ARTHROPLASTY Left 11/03/2015   Procedure: Left Total Knee Arthroplasty;  Surgeon: Valeria Batman, MD;  [facility];  Service: Orthopedics;  Laterality: Left;  . TOTAL KNEE ARTHROPLASTY Right 07/17/2018   Procedure: RIGHT TOTAL KNEE ARTHROPLASTY;  Surgeon: Valeria Batman, MD;  Location[facility] OR;  Service: Orthopedics;  Laterality: Right;  Marland Kitchen VIDEO BRONCHOSCOPY Bilateral 01/31/2013   Procedure: VIDEO BRONCHOSCOPY WITHOUT FLUORO;  Surgeon: Merwyn Katos, MD;  [facility] ENDOSCOPY;  Service: Cardiopulmonary;  Laterality: Bilateral;    No Known Allergies  Immunization History  Administered Date(s) Administered  . Influenza, High Dose Seasonal PF 04/22/2019  . PPD Test 04/06/2010  . Tdap 05/30/2017    History reviewed. No pertinent family history.   Current Outpatient Medications:  .  clobetasol cream (TEMOVATE) 0.05 %,  Apply 1 application topically 2 (two) times daily., Disp: 45 g, Rfl: 1 .  losartan (COZAAR) 25 MG tablet, Take 1 tablet (25 mg total) by mouth daily., Disp: 20 tablet, Rfl: 0 .  multivitamin-iron-minerals-folic acid (CENTRUM) chewable tablet, Chew 1 tablet by mouth every 3 (three) days., Disp: , Rfl:  .  rosuvastatin (CRESTOR) 5 MG tablet, Take 1 tablet (5 mg total) by mouth daily., Disp: 90 tablet, Rfl: 3 .  STELARA 45 MG/0.5ML SOSY injection, Inject 1 mL into the skin every 3 (three) months., Disp: , Rfl:  .  Triamcinolone Acetonide (TRIAMCINOLONE 0.1 % CREAM : EUCERIN) CREA,  Apply 1 application topically 2 (two) times daily., Disp: 1 each, Rfl: 0      Objective:   Vitals:   11/10/20 0909  BP: 114/76  Pulse: 79  Temp: 97.8 F (36.6 C)  TempSrc: Temporal  SpO2: 100%  Weight: 152 lb 12.8 oz (69.3 kg)  Height:  (1.702 m)    Estimated body mass index is 23.93 kg/m as calculated from the following:   Height as of this encounter:  (1.702 m).   Weight as of this encounter: 152 lb 12.8 oz (69.3 kg).  @  [employer]   11/10/20 0909  Weight: 152 lb 12.8 oz (69.3 kg)     Physical Exam  General Appearance:    Alert, cooperative, no distress, appears stated age - yes , Deconditioned looking - no , OBESE  - no, Sitting on Wheelchair -  no  Head:    Normocephalic, without obvious abnormality, atraumatic  Eyes:    PERRL, conjunctiva/corneas clear,  Ears:    Normal TM's and external ear canals, both ears  Nose:   Nares normal, septum midline, mucosa normal, no drainage    or sinus tenderness. OXYGEN ON  - no . Patient is @ ra   Throat:   Lips, mucosa, and tongue normal; teeth and gums normal. Cyanosis on lips - no HOARSE VOICE  Neck:   Supple, symmetrical, trachea midline, no adenopathy;    thyroid:  no enlargement/tenderness/nodules; no carotid   bruit or JVD  Back:     Symmetric, no curvature, ROM normal, no CVA tenderness  Lungs:     Distress - no , Wheeze  no, Barrell Chest - no, Purse lip breathing - no, Crackles -  Y"ES VELCRO AT BAS   Chest Wall:    No tenderness or deformity.    Heart:    Regular rate and rhythm, S1 and S2 normal, no rub   or gallop, Murmur - no  Breast Exam:    NOT DONE  Abdomen:     Soft, non-tender, bowel sounds active all four quadrants,    no masses, no organomegaly. Visceral obesity - no  Genitalia:   NOT DONE  Rectal:   NOT DONE  Extremities:   Extremities - normal, Has Cane - no, Clubbing - no, Edema - no  Pulses:   2+ and symmetric all extremities  Skin:   Stigmata of Connective Tissue Disease - no  Lymph nodes:   Cervical, supraclavicular, and axillary nodes normal  Psychiatric:  Neurologic:   Pleasant - yes, Anxious - no, Flat affect - no  CAm-ICU - neg, Alert and Oriented x 3 - yes, Moves all 4s - yes, Speech - normal, Cognition - intact         Assessment:       ICD-10-CM   1. ILD (interstitial lung disease) (HCC)  J84.9 CT Chest High Resolution    Pulmonary function test    Ambulatory referral to Gastroenterology  2. At risk for aspiration  Z91.89 CT Chest High Resolution    Pulmonary function test    Ambulatory referral to Gastroenterology   Suspect given age greater than 65, Velcro crackles at the base, previous description of probable UIP and nearly negative serology [psoriatic arthritis does not count is connective tissue disease ILD], vocal cord paralysis with some aspiration risk that this is IPF.  I am worried that his worsening cough reflects progression although this could just be worsening acid reflux  I think he would benefit from  high-resolution CT scan and pulmonary function test for restaging but also get a GI referral.  He wants to stay within the [facility] family for his GI services.    Plan:     Patient Instructions      ICD-10-CM   1. ILD (interstitial lung disease) (HCC)  J84.9   2. At risk for aspiration  Z91.89     Need to restage and understand your  current ILD  Plan Do repeat full PFT Do HRCT supine and prone REfere [facility]   Followup  - next few to several weeks but after completing PFT and HRCT  ( Level 05 visit: Estb 40-54 min  in  visit type: on-site physical face to visit  in total care time and counseling or/and coordination of care by this undersigned MD - Dr Kalman Shan. This includes one or more of the following on this same day 11/10/2020: pre-charting, chart review, note writing, documentation discussion of test results, diagnostic or treatment recommendations, prognosis, risks and benefits of management options, instructions, education, compliance or risk-factor reduction. It excludes time spent by the CMA or office staff in the care of the patient. Actual time 45 min)    SIGNATURE    Dr. Kalman Shan, M.D., F.C.C.P,  Pulmonary and Critical Care Medicine Staff Physician, [facility] Center Director - Interstitial Lung Disease  Program  Pulmonary Fibrosis [facility] at [facility] [place], [place], [address]  Pager: [phone], If no answer or between  15:00h - 7:00h: call [phone] Telephone: [phone]  10:09 AM 11/10/2020

## 2020-11-17 ENCOUNTER — Encounter: Payer: Self-pay | Admitting: Nurse Practitioner

## 2020-11-23 ENCOUNTER — Other Ambulatory Visit: Payer: Self-pay

## 2020-11-23 ENCOUNTER — Ambulatory Visit (INDEPENDENT_AMBULATORY_CARE_PROVIDER_SITE_OTHER)
Admission: RE | Admit: 2020-11-23 | Discharge: 2020-11-23 | Disposition: A | Discharge status: Home / Self Care | Payer: Medicare Other | Source: Ambulatory Visit | Attending: Internal Medicine | Admitting: Internal Medicine

## 2020-11-23 DIAGNOSIS — J849 Interstitial pulmonary disease, unspecified: Secondary | ICD-10-CM | POA: Diagnosis not present

## 2020-11-23 DIAGNOSIS — Z9189 Other specified personal risk factors, not elsewhere classified: Secondary | ICD-10-CM

## 2020-11-26 DIAGNOSIS — I2510 Atherosclerotic heart disease of native coronary artery without angina pectoris: Secondary | ICD-10-CM

## 2020-11-26 DIAGNOSIS — I700 Atherosclerosis of aorta: Secondary | ICD-10-CM

## 2020-11-26 DIAGNOSIS — R0609 Other forms of dyspnea: Secondary | ICD-10-CM

## 2020-11-26 DIAGNOSIS — I2729 Other secondary pulmonary hypertension: Secondary | ICD-10-CM

## 2020-11-26 DIAGNOSIS — R0600 Dyspnea, unspecified: Secondary | ICD-10-CM

## 2020-11-26 NOTE — Telephone Encounter (Signed)
Please ask schedulers to schedule the tests (ordered) and to have an OV with me after the tests. JG

## 2020-11-26 NOTE — Telephone Encounter (Signed)
    ICD-10-CM   1. Dyspnea on exertion  R06.00 PCV ECHOCARDIOGRAM COMPLETE    PCV MYOCARDIAL PERFUSION WITH LEXISCAN  2. Coronary artery calcification seen on CAT scan  I25.10 PCV ECHOCARDIOGRAM COMPLETE    PCV MYOCARDIAL PERFUSION WITH LEXISCAN  3. Aortic atherosclerosis (HCC)  I70.0 PCV ECHOCARDIOGRAM COMPLETE    PCV MYOCARDIAL PERFUSION WITH LEXISCAN  4. Other secondary pulmonary hypertension (HCC)  I27.29 PCV ECHOCARDIOGRAM COMPLETE    PCV MYOCARDIAL PERFUSION WITH Rosiland Oz, MD, [facility] 11/26/2020, 6:15 PM Office: [phone] Pager: [phone]

## 2020-12-02 ENCOUNTER — Other Ambulatory Visit: Payer: Self-pay

## 2020-12-02 ENCOUNTER — Other Ambulatory Visit: Payer: Self-pay | Admitting: Nurse Practitioner

## 2020-12-02 ENCOUNTER — Ambulatory Visit: Payer: Medicare Other | Admitting: Nurse Practitioner

## 2020-12-02 ENCOUNTER — Encounter: Payer: Self-pay | Admitting: Nurse Practitioner

## 2020-12-02 VITALS — BP 100/62 | HR 48 | Ht 67.0 in | Wt 152.0 lb

## 2020-12-02 DIAGNOSIS — J849 Interstitial pulmonary disease, unspecified: Secondary | ICD-10-CM

## 2020-12-02 DIAGNOSIS — K219 Gastro-esophageal reflux disease without esophagitis: Secondary | ICD-10-CM

## 2020-12-02 DIAGNOSIS — J383 Other diseases of vocal cords: Secondary | ICD-10-CM | POA: Diagnosis not present

## 2020-12-02 DIAGNOSIS — R1310 Dysphagia, unspecified: Secondary | ICD-10-CM

## 2020-12-02 DIAGNOSIS — Z9189 Other specified personal risk factors, not elsewhere classified: Secondary | ICD-10-CM

## 2020-12-02 MED ORDER — OMEPRAZOLE 20 MG PO CPDR: 20.0000 mg | DELAYED_RELEASE_CAPSULE | Freq: Every day | ORAL | 1 refills | Status: DC

## 2020-12-02 NOTE — Progress Notes (Addendum)
12/02/2020 Alan Roman [id number] 05/28/1946   CHIEF COMPLAINT:  Abnormal esophageal study   HISTORY OF PRESENT ILLNESS: Alan Roman is a pleasant 75 year old male with a past medial history of psoriatic arthritis previously on Humira x 3 years now on Stelara, hypertension, hyperlipidemia, coronary calcification by chest CT, latent TB (was untreated initially and in 12/2012 he presented with a mild alveolitis on CT scan and pleural effusion that required thoracentesis, bronchoscopy was negative for AFB but he was treated under the guidance of local health department and ID with multiple drug antituberculous treatment for 3 months) and interstitial lung disease. Past right and left knee total replacement surgery in 2017 and 2019.   He has a history of interstitial lung disease followed by pulmonologist Dr. Marchelle Gearing.  He was recently seen by Dr. Marchelle Gearing in office 11/10/2020, at that time he had concerns that the patient's cough was worse and may be secondary to acid reflux.  A GI consult was requested for further evaluation.  It was also noted that the patient is at a higher risk for aspiration with persistent cough with history of vocal cord paralysis. He complains of having a dry cough for the past year or 2.  He has pill dysphagia for the past 2 years.  He typically breaks larger pills or vitamin tablets in half it is easier for him to swallow.  No dysphagia with liquids or solid foods.  He has heartburn only if he eats spicy or acidic foods such as pizza.  He eats a vegetarian diet.  He has voice hoarseness associated with vocal cord paralysis per ENT evaluation 04/2015 at [facility].  He underwent a barium swallow with tablet 06/07/2019 which identified a Schatzki's ring at the GE junction which prevented passage of a 13 mm barium tablet.  No evidence of a hiatal hernia or gastroesophageal reflux.  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  Esophagus was unremarkable.  Aortic atherosclerosis in addition to the left main and two-vessel coronary artery disease and cholelithiasis was also noted. He denies having any upper or lower abdominal pain.  No lower abdominal pain.  No NSAID use.  No alcohol use.  No weight loss.  He possibly underwent an upper endoscopy at least once by Dr. Loreta Ave but he cannot recall when this was done.  He stated completing a colonoscopy with Dr. Loreta Ave approximately 5 years ago which was normal.  I was able to obtain a colonoscopy report by Dr. Loreta Ave dated 05/28/2004 in Care Everywhere which showed internal hemorrhoids, no polyps.  I will request a copy of his most recent colonoscopy for further review.  No known family history of colorectal cancer.  He is followed by cardiologist Dr. Jacinto Halim due to a history of hypertension, hyperlipidemia and coronary calcifications per chest CT.  Patient stated Dr. Marchelle Gearing recently communicated with Dr. Jacinto Halim and the patient is scheduled for a repeat ECHO and Lexiscan.  He denies having any chest pain, palpitations or dizziness.  CBC Latest Ref Rng & Units 05/21/2020 02/27/2020 02/12/2020  WBC 4.0 - 10.5 K/uL 5.1 6.0 6.1  Hemoglobin 13.0 - 17.0 g/dL 25.4 27.0 62.3  Hematocrit 39.0 - 52.0 % 39.6 41.3 40.4  Platelets 150 - 400 K/uL 161 180 157    CMP Latest Ref Rng & Units 05/21/2020 02/27/2020 09/11/2019  Glucose 70 - 99 mg/dL 83 762(G) 91  BUN 8 - 23 mg/dL 17  12 13  Creatinine 0.61 - 1.24 mg/dL 7.10 6.26 9.48  Sodium 135 - 145 mmol/L 141 137 138  Potassium 3.5 - 5.1 mmol/L 4.2 4.2 4.0  Chloride 98 - 111 mmol/L 106 102 103  CO2 22 - 32 mmol/L 27 25 28   Calcium 8.9 - 10.3 mg/dL ) 9.1 8.9  Total Protein 6.5 - 8.1 g/dL 6.9 7.1 7.3  Total Bilirubin 0.3 - 1.2 mg/dL 1.0 1.1 0.7  Alkaline Phos 38 - 126 U/L 76 92 -  AST 15 - 41 U/L 35 36 28  ALT 0 - 44 U/L 25 19 17    Barium swallow study  06/07/2019: Schatzki ring at the GE junction, which prevents passage of a 13 mm barium tablet. No evidence of hiatal hernia or gastroesophageal reflux.   CT chest 05/31/2019: 1. There is mild, bibasilar predominant pulmonary fibrosis in a pattern featuring subtle subpleural reticular interstitial opacity, mild traction bronchiectasis and occasional areas of bronchiolectasis, for example in the lateral segment right middle lobe and right lung base (series 4, image 176). These findings are very slightly worsened in comparison to prior examination dated 11/10/2015. Findings are consistent with mild pulmonary fibrosis in a probable UIP pattern. Findings are categorized as probable UIP per consensus guidelines   2.  Coronary artery disease. Aortic Atherosclerosis. 3.  Cholelithiasis.  Echocardiogram  [11/12/2015]:  Left ventricle cavity is small. Normal global wall motion. Normal diastolic filling pattern. Calculated EF 66%. Trace mitral regurgitation. Mild to moderate tricuspid regurgitation. Borderline pulmonary hypertension. Pulmonary artery systolic pressure is estimated at 31 mm Hg. Trace pulmonic regurgitation. compared to the study done on 09/19/11, TR new.   Nuclear stress test  [11/16/2015]:  1. The resting electrocardiogram demonstrated normal sinus rhythm and normal resting conduction. Poor R wave progression. Stress EKG is non diagnostic for ischemia as it is a pharmacologic stress using Lexiscan. 2. Myocardial perfusion imaging is normal. The LV appeared to be small. Overall left ventricular systolic function was normal without regional wall motion abnormalities. The left ventricular ejection fraction was mildly reduced at 49%. This is a low risk study     Past Medical History:  Diagnosis Date   Acute bronchiolitis due to other infectious organisms 01/26/2013   Arthritis    "knees" (11/03/2015)   Extrapulmonary TB (tuberculosis) 02/02/2013   High risk medication use 12/27/2016    History of hiatal hernia 2007; 2009   Hypertension    Pleural effusion 01/26/2013   Social History:  He is originally from [place]. He is married. Retired. He has one son and one daughter. Nonsmoker. No alcohol use. No drug use.   Family History: Mother deceased 47. Father deceased age 53. No family history of esophageal, gastric or colon cancer.   No Known Allergies   Outpatient Encounter Medications as of 12/02/2020  Medication Sig   clobetasol cream (TEMOVATE) 0.05 % Apply 1 application topically 2 (two) times daily.   losartan (COZAAR) 25 MG tablet Take 1 tablet (25 mg total) by mouth daily.   multivitamin-iron-minerals-folic acid (CENTRUM) chewable tablet Chew 1 tablet by mouth every 3 (three) days.   rosuvastatin (CRESTOR) 5 MG tablet Take 1 tablet (5 mg total) by mouth daily.   STELARA 45 MG/0.5ML SOSY injection Inject 1 mL into the skin every 3 (three) months.   Triamcinolone Acetonide (TRIAMCINOLONE 0.1 % CREAM : EUCERIN) CREA Apply 1 application topically 2 (two) times daily.   No facility-administered encounter medications on file as of 12/02/2020.    REVIEW OF SYSTEMS: Gen:  Denies fever, sweats or chills. No weight loss.  CV: Denies chest pain, palpitations or edema. Resp:See HPI.  GI:  See HPI.  GU : Denies urinary burning, blood in urine, increased urinary frequency or incontinence. MS: + Arthritis.  Derm: Denies rash, itchiness, skin lesions or unhealing ulcers. Psych: Denies depression, anxiety or memory loss.  Heme: Denies bruising, bleeding. Neuro:  Denies headaches, dizziness or paresthesias. Endo:  Denies any problems with DM, thyroid or adrenal function.   PHYSICAL EXAM: BP 100/62   Pulse (!) 48   Ht 5\' 7"  (1.702 m)   Wt 152 lb (68.9 kg)   SpO2 98%   BMI 23.81 kg/m  General: Well developed 75 year old Indo Asian male in  no acute distress. Head: Normocephalic and atraumatic. Eyes:  Sclerae non-icteric, conjunctive pink. Ears: Normal auditory  acuity. Mouth: Dentition intact. No ulcers or lesions.  Neck: Supple, no lymphadenopathy or thyromegaly.  Lungs: Clear bilaterally to auscultation without wheezes, crackles or rhonchi. Heart: Regular rate and rhythm. No murmur, rub or gallop appreciated.  Abdomen: Soft, nontender, non distended.  Protuberant.  No masses. No hepatosplenomegaly. Normoactive bowel sounds x 4 quadrants.  Midline upper abdominal scar intact. Rectal: Deferred.  Musculoskeletal: Symmetrical with no gross deformities. Skin: Warm and dry. No rash or lesions on visible extremities. Extremities: No edema. Neurological: Alert oriented x 4, no focal deficits.  Psychological:  Alert and cooperative. Normal mood and affect.  ASSESSMENT AND PLAN:  80.  75 year old male with pill dysphagia, infrequent heartburn with a chronic dry cough concerning for possible acid reflux/LPR.  Barium swallow 06/07/2019 identified a Schatzki's ring at the GE junction which prevented the passage of a barium tablet. -EGD benefits and risks discussed including risk with sedation, risk of bleeding, perforation and infection  -Cardiac clearance required prior to scheduling EGD as the patient is currently scheduled for an echo and Lexi scan per Dr. 13/12/2018 -Omeprazole 20 mg once daily -Request past EGD records from Dr. Jacinto Halim -Patient to call our office if his symptoms worsen  2.  Colon cancer screening.  Patient reported completing a colonoscopy by Dr. Loreta Ave approximately 5 years ago.  The only colonoscopy record located in care everywhere was in 2005 and was normal. -Request copy of past colonoscopy procedure and biopsy results, to verify colonoscopy recall date after records reviewed  3.  Interstitial lung disease, past latent TB.  Vocal cord paralysis.  Patient is at increased risk for aspiration. -Request pulmonary clearance from Dr. 2006 prior to scheduling EGD  4. Coronary calcifications per CT. ECHO and Lexiscan ordered per cardiologist  Dr. Marchelle Gearing  5.  Rheumatoid arthritis on Stelara    ADDENDUM:  Pulmonary clearance was received 12/03/2020, see phone note from Dr. 02/02/2021 and cardiac clearance received from Dr. Marchelle Gearing see phone note 03/02/2021. Patient has history of vocal cord paralysis and  idiopathic pulmonary fibrosis) probable UIP pattern. Procedure needs to be schedule at [facility] as verified by [facility]. EGD schedule at [facility] on 04/01/2021 with Dr. 06/01/2021.    ADDENDUM: I received a copy of the patient's colonoscopy completed by Dr. Russella Dar 04/20/2015.  The quality of the bowel preparation was adequate.  Multiple small mouth diverticula were found in the entire colon.  The terminal ileum appeared normal.  Small internal hemorrhoids were noted on retroflexion.  Recall colonoscopy in 10 years.    CC:  04/22/2015, MD

## 2020-12-02 NOTE — Progress Notes (Signed)
Reviewed and agree with management plan.  Oluwatobiloba Martin T. Demiana Crumbley, MD FACG [phone]  

## 2020-12-02 NOTE — Patient Instructions (Addendum)
If you are age 76 or older, your body mass index should be between 23-30. Your Body mass index is 23.81 kg/m. If this is out of the aforementioned range listed, please consider follow up with your Primary Care Provider.  MEDICATION: We have sent the following medication to your pharmacy for you to pick up at your convenience: Omeprazole 20 Mg tablet, take once a day.  RECOMMENDATIONS: We will obtain cardiac and pulmonary clearance before we can schedule the EGD.   It was great seeing you today! Thank you for entrusting me with your care and choosing [facility].  Arnaldo Natal, CRNP

## 2020-12-03 ENCOUNTER — Telehealth: Payer: Self-pay

## 2020-12-03 ENCOUNTER — Telehealth: Payer: Self-pay | Admitting: Internal Medicine

## 2020-12-03 NOTE — Telephone Encounter (Signed)
Request for surgical clearance:     Endoscopy Procedure  What type of surgery is being performed?     EGD/colonoscopy  When is this surgery scheduled?     TBD  What type of clearance is required ?   Cardiac  Practice name and name of physician performing surgery?      [facility]  What is your office phone and fax number?      Phone- [phone]  [phone]  Anesthesia type (None, local, MAC, general) ?       MAC

## 2020-12-03 NOTE — Progress Notes (Signed)
ILD + ?progression. Will review May 20 visit. Will not call ahead with result

## 2020-12-03 NOTE — Telephone Encounter (Signed)
Colleen: ok for EGD with moderate sedation from pulm perspective bcaus ehe did not desaturate. If VC a concern, then yhou can do EGD at [facility] with anesthesia support on dipriva  Thanks  MR

## 2020-12-04 NOTE — Telephone Encounter (Signed)
    Jaylynn [facility] DOB:  [birth year]  MRN:  [id number]   Primary Cardiologist: Yates Decamp, MD  Chart reviewed as part of pre-operative protocol coverage.   Patient follows with Dr. Jacinto Halim of [facility]. Will defer preoperative assessment to him.   I will route this recommendation to the requesting provider and remove from the preop pool at this time.   Beatriz Stallion, PA-C 12/04/2020, 10:28 AM

## 2020-12-07 NOTE — Telephone Encounter (Signed)
How urgent is the clearance needed? I have sent message to my staff to do the echo and stress soon.

## 2020-12-07 NOTE — Telephone Encounter (Signed)
Colleen please advise.

## 2020-12-08 NOTE — Telephone Encounter (Signed)
Dr. Jacinto Halim, thank you for the update. Ten days until ECHO and stress test is fine.  [employer]

## 2020-12-08 NOTE — Telephone Encounter (Signed)
Pulmonary clearance received.  Await cardiac clearance.   EGD to be scheduled after cardiac clearance received.  Note, prior colonoscopy records from Dr. Kenna Gilbert office requested, to verify colonoscopy recall date after records reviewed.

## 2020-12-15 ENCOUNTER — Other Ambulatory Visit (HOSPITAL_COMMUNITY)
Admission: RE | Admit: 2020-12-15 | Discharge: 2020-12-15 | Disposition: A | Discharge status: Home / Self Care | Payer: Medicare Other | Source: Ambulatory Visit | Attending: Internal Medicine | Admitting: Internal Medicine

## 2020-12-15 DIAGNOSIS — Z20822 Contact with and (suspected) exposure to covid-19: Secondary | ICD-10-CM | POA: Diagnosis not present

## 2020-12-15 DIAGNOSIS — Z01812 Encounter for preprocedural laboratory examination: Secondary | ICD-10-CM | POA: Insufficient documentation

## 2020-12-16 LAB — SARS CORONAVIRUS 2 (TAT 6-24 HRS): SARS Coronavirus 2: NEGATIVE

## 2020-12-18 ENCOUNTER — Ambulatory Visit (INDEPENDENT_AMBULATORY_CARE_PROVIDER_SITE_OTHER): Payer: Medicare Other | Admitting: Internal Medicine

## 2020-12-18 ENCOUNTER — Ambulatory Visit: Payer: Medicare Other | Admitting: Internal Medicine

## 2020-12-18 ENCOUNTER — Other Ambulatory Visit: Payer: Self-pay

## 2020-12-18 ENCOUNTER — Encounter: Payer: Self-pay | Admitting: Internal Medicine

## 2020-12-18 VITALS — BP 110/58 | HR 62 | Temp 98.7°F | Ht 67.0 in | Wt 155.0 lb

## 2020-12-18 DIAGNOSIS — J383 Other diseases of vocal cords: Secondary | ICD-10-CM | POA: Diagnosis not present

## 2020-12-18 DIAGNOSIS — Z9189 Other specified personal risk factors, not elsewhere classified: Secondary | ICD-10-CM

## 2020-12-18 DIAGNOSIS — D849 Immunodeficiency, unspecified: Secondary | ICD-10-CM

## 2020-12-18 DIAGNOSIS — J84112 Idiopathic pulmonary fibrosis: Secondary | ICD-10-CM

## 2020-12-18 DIAGNOSIS — J849 Interstitial pulmonary disease, unspecified: Secondary | ICD-10-CM

## 2020-12-18 LAB — PULMONARY FUNCTION TEST
DL/VA % pred: 104 %
DL/VA: 4.57 ml/min/mmHg/L
DLCO cor % pred: 50 %
DLCO cor: 14.24 ml/min/mmHg
DLCO unc % pred: 50 %
DLCO unc: 14.24 ml/min/mmHg
FEF 25-75 Post: 3.79 L/sec
FEF 25-75 Pre: 3.74 L/sec
FEF2575-%Change-Post: 1 %
FEF2575-%Pred-Post: 196 %
FEF2575-%Pred-Pre: 194 %
FEV1-%Change-Post: 4 %
FEV1-%Pred-Post: 72 %
FEV1-%Pred-Pre: 69 %
FEV1-Post: 1.84 L
FEV1-Pre: 1.76 L
FEV1FVC-%Change-Post: 3 %
FEV1FVC-%Pred-Pre: 116 %
FEV6-%Change-Post: 2 %
FEV6-Post: 2.01 L
FEV6-Pre: 1.97 L
FVC-%Change-Post: 1 %
FVC-%Pred-Post: 60 %
FVC-%Pred-Pre: 59 %
FVC-Post: 2.01 L
FVC-Pre: 1.98 L
Post FEV1/FVC ratio: 92 %
Post FEV6/FVC ratio: 100 %
Pre FEV1/FVC ratio: 89 %
Pre FEV6/FVC Ratio: 100 %
RV % pred: 34 %
RV: 0.82 L
TLC % pred: 55 %
TLC: 3.6 L

## 2020-12-18 NOTE — Progress Notes (Signed)
NOV 2020 with Dr Vassie Loll  75 year old never smoker of [place] origin initial consult 05/2019  for evaluation of hoarseness, dry cough and congestion. He is originally from [place], [place], [place]-immigrated to the [place] in 1971 for his bachelors to [place] and then settled in [place] in 1978 and got into [employer] business.  PMH - - psoriatic arthritis by Dr. Orene Desanctis, was originally on Humira and is now on Cosentyx. - latent TB that was untreated initially and in 12/2012 he presented with a mild alveolitis on CT scan and pleural effusion that required thoracentesis, bronchoscopy was negative for AFB but he was treated under the guidance of local health department and ID with multiple drug antituberculous treatment for 3 months.  His problem lists extrapulmonary TB but on my review all AFB cultures were negative.   -07/2018 chest x-ray shows eventration of right hemidiaphragm.  - vocal cord paralysis in 04/2015 and underwent ENT evaluation for persistent dysphonia and underwent gel injection of the vocal cords at [facility] without any relief  Chief Complaint  Patient presents with  . Follow-up    1 month rov- review CT chest 10/31.  pt states his s/s are unchanged since last month, prod cough with clear mucus qam that subsides throughout the day.      Symptoms are persistent. Main issues are hoarseness, dry cough and congestion  We reviewed HRCT in detail today  CT angiogram 10/2015 -which shows mild reticular densities suggestive but not diagnostic of ILD.  HRCT 05/31/19 mild, bibasilar predominant pulmonary fibrosis in a pattern featuring subtle subpleural reticular interstitial opacity, mild traction bronchiectasis and occasional areas of Bronchiolectasis - ' probable UIP ' - slight worse   OV Alan Roman Jan 2021  75 year old male never smoker presenting to our office today after completing pulmonary function testing.  Patient was last seen in our office in October/2020  for initial consult with Dr. Vassie Loll.  At that office visit patient was scheduled for pulmonary function testing, lab work was completed to further evaluate patient's probable UIP on October/2020 high-resolution CT chest, patient was referred to ear nose and throat, swallow studies were also ordered on the patient.  Patient completed ENT referral in 07/02/2019 with Dr. Merril Abbe Recommendations for voice therapy, that he follow-up with gastroenterology regarding this Schatzki's ring.  Follow-up in April.  Proceed forward with pulmonary pulmonary function test.  Patient reports that he is received a telephone call to restart voice rehab but he has not done this yet as he wanted to review pulmonary function testing with our office first.  Patient swallow studies are listed below:  06/06/2019-swallow study-mild aspiration risk, no treatment recommended at this time 06/07/2019-barium swallow-Schatzki ring at the GE junction which prevents passage of a 13 mm barium tablet  Patient has a gastroenterologist Dr. Loreta Ave but he has not seen her in over a year.  Patient has no scheduled upcoming appointments.  Patient continues to follow-up with rheumatology, Dr. Corliss Skains.  Per chart review last office note was in July/2020.  Patient presenting today to review December/2020 pulmonary function test.  Those results are listed below:  07/19/2019-pulmonary function test-FVC 2.32 (63% predicted), postbronchodilator ratio 93, postbronchodilator FEV1 1.93 (69% predicted), DLCO 13.59 (43% predicted) >>>Severe restriction, moderately severe diffusion defect  Lab work from November/2020 are also listed below:  06/03/2019-ANA-1: 40, nuclear homogenous 06/03/2019-rheumatoid factor-14 06/03/2019-anti-Jo 1-less than 0.2 06/03/2019-aldolase-4.9 06/03/2019-centromere antibodies-negative 06/03/2019-antiscleroderma antibody-less than 1 - 06/03/2019-Sjogren's syndrome 8 and B-negative 06/03/2019-hypersensitivity  pneumonitis-negative 06/03/2019-sed rate 22  Mildly elevated ANA and rheumatoid factor.  Felt to be not clinically significant.  Rest of results are negative.  Patient continues to report that he is asymptomatic.  Patient tolerates walk in office without any oxygen desaturations.  He does continue to have occasional bouts of congestion which is believed to be related to the Cosentyx.  Family has messaged rheumatology regarding this.  Tests:   07/19/2019-pulmonary function test-FVC 2.32 (63% predicted), postbronchodilator ratio 93, postbronchodilator FEV1 1.93 (69% predicted), DLCO 13.59 (43% predicted) >>>Severe restriction, moderately severe diffusion defect  07/16/2019-SARS-CoV-2-not detected  06/03/2019-ANA-1: 40, nuclear homogenous 06/03/2019-rheumatoid factor-14 06/03/2019-anti-Jo 1-less than 0.2 06/03/2019-aldolase-4.9 06/03/2019-centromere antibodies-negative 06/03/2019-antiscleroderma antibody-less than 1 - 06/03/2019-Sjogren's syndrome 8 and B-negative 06/03/2019-hypersensitivity pneumonitis-negative 06/03/2019-sed rate 22  06/01/2019-CT chest high-res mild, bibasilar predominant pulmonary fibrosis in a pattern with featuring subtle subpleural reticular interstitial opacity and mild traction bronchiectasis occasional areas of bronchiolectasis, probable UIP  06/06/2019-swallow study-mild aspiration risk, no treatment recommended at this time  06/07/2019-barium swallow-Schatzki ring at the GE junction which prevents passage of a 13 mm barium tablet  FENO:  No results found for: NITRICOXIDE  MH: Psoriatic arthritis (Dr. Corliss Skains - prev on Humira, now on Cosentyx), latent TB that was untreated initially in 12/2012 with mild alveolitis on CT scan and pleural effusion that required thoracentesis, bronchoscopy was negative for AFB but he was treated under guidance with local health department infectious disease with multidrug antituberculosis treatment for 3 months, eventration of right  hemidiaphragm, vocal cord paralysis in September/2016 underwent ENT evaluation for persistent dysphonia and underwent gel injection of vocal cords at [facility] without any relief Smoker/ Smoking History: Never smoker Maintenance: None   He is originally from [place], [place]-immigrated to the [place] in 1971 for his bachelors to [place] and then settled in [place] in 1978 and got into [employer] business.    OV 11/10/2020  Subjective:  Patient ID: Alan Roman, male , DOB: 1945-08-04 , age 39 y.o. , MRN: [id number] , ADDRESS: [address] [place] [address] PCP Ignatius Specking, MD Patient Care Team: Ignatius Specking, MD as PCP - General (Internal Medicine)  This Provider for this visit: Treatment Team:  Attending Provider: Kalman Shan, MD    11/10/2020 -   Chief Complaint  Patient presents with  . Follow-up    Increased coughing for past 3 months, sometimes productive with whitish sputum. Denies shortness of breath.      HPI [facility] 75 y.o. -asked by Dr. Jacinto Halim to establish at the pulmonary fibrosis center.  Therefore patient is seeing Dr. Marchelle Gearing.  Patient used to be previously followed by Dr. Vassie Loll.  Last seen by Dr. Vassie Loll in 2020.  Then seen by nurse practitioner Alan Roman in January 2021.   His daughtre [name] later joined on the phone call.  Patient is known to have ILD probable UIP pattern.  Is on observation at this point.   [facility] Comprehensive ILD Questionnaire  Symptoms:  -   he reports insidious onset of dyspnea for the last 4 months.  Since it started it is the same.  Although his ILD is present even before that.  He says that he has a very mild cough however in the last 4 to 6 months it is worse.  Particularly when he talks for a long time.  He does not wake up in the middle of the night because of the cough he does bring up some white sputum that is very light white.  Cough does not affect  his voice.  He does clear his  throat.  He has never had hemoptysis.  He does not cough when he lies down.  No nausea vomiting or diarrhea.  No wheezing.  Cough does get worse with eating   SYMPTOM SCALE - ILD 11/10/2020   O2 use ra  Shortness of Breath 0 -> 5 scale with 5 being worst (score 6 If unable to do)  At rest 0  Simple tasks - showers, clothes change, eating, shaving 0  Household (dishes, doing bed, laundry) 0  Shopping 0  Walking level at own pace 1  Walking up Stairs 1  Total (30-36) Dyspnea Score 2  How bad is your cough? 1  How bad is your fatigue x  How bad is nausea 0  How bad is vomiting?  0  How bad is diarrhea? 0  How bad is anxiety? x  How bad is depression x       Past Medical History : He has psoriatic arthritis.  He is immunosuppressed and is on immunomodulators.  Currently on Simponi.  He does not have rheumatoid arthritis.  No heart failure.  No other collagen vascular disease of connective tissue disease.  No COPD.  No asthma.  No HIV.  No hiatal hernia.  No pulmonary hypertension.  No diabetes.  No thyroid disease.  No stroke no seizures.  No tuberculosis no kidney disease no pneumonia.  No blood clots no heart disease no pleurisy.   He is deemed to be an aspiration risk he has vocal cord paralysis.  He is to see Dr. Loreta Ave GI.  He wants to switch to  GI but because of the pandemic has not been able to establish.  ROS: He does not have any fatigue.  No arthralgia.  No  dysphagia.  No Raynaud's.  No dry eyes no fever no weight loss.  No nausea no vomiting.  No heartburn no snoring no rash no ulcers.   FAMILY HISTORY of LUNG DISEASE: No pulmonary fibrosis.  No COPD.  No asthma no sarcoid no cystic fibrosis no hypersensitive pneumonitis no autoimmune disease in the family.    EXPOSURE HISTORY: No tobacco.  No smoking cigars no smoking pipes.  No electronic cigarette use.  No vaping marijuana.  No cocaine use.  No intravenous drug use.   HOME and HOBBY DETAILS : He lives in a  76 year old family home.  He is lived there for 40 years.  No damp environment.  No mold or mildew.  No humidifier use no CPAP use no nebulizer use.  No steam iron use.  No Jacuzzi use.  No misting Fountain.  No pet birds or parakeets.  No feather pillows or blankets.  No mold in the [facility] duct.  No music habits no gardening habits.  Never exposed to birds or chickens.  No water damage in the basement.  No straw meds.  No hot tub use.  No animals at work.    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  Organic antigen exposure history is negative.  Inorganic antigen exposure history is negative.   PULMONARY TOXICITY HISTORY (27 items):  Simponi for psoriatic arthritis     PFT  PFT Results Latest Ref Rng & Units 07/19/2019  FVC-Pre L 2.32  FVC-Predicted Pre % 63  FVC-Post L 2.06  FVC-Predicted Post % 56  Pre FEV1/FVC % % 83  Post FEV1/FCV % % 93  FEV1-Pre L 1.94  FEV1-Predicted  Pre % 69  FEV1-Post L 1.93  DLCO uncorrected ml/min/mmHg 13.59  DLCO UNC% % 43  DLVA Predicted % 90  TLC L 3.80  TLC % Predicted % 55  RV % Predicted % 60    HRCT Oct 2020   IMPRESSION: 1. There is mild, bibasilar predominant pulmonary fibrosis in a pattern featuring subtle subpleural reticular interstitial opacity, mild traction bronchiectasis and occasional areas of bronchiolectasis, for example in the lateral segment right middle lobe and right lung base (series 4, image 176). These findings are very slightly worsened in comparison to prior examination dated 11/10/2015. Findings are consistent with mild pulmonary fibrosis in a probable UIP pattern. Findings are categorized as probable UIP per consensus guidelines: Diagnosis of Idiopathic Pulmonary Fibrosis: An Official ATS/ERS/JRS/ALAT Clinical Practice Guideline. Am Rosezetta Schlatter Crit Care Med Vol 198, Iss 5, [phone], Apr 01 2017.  2.  Coronary artery disease. Aortic Atherosclerosis (ICD10-I70.0).  3.   Cholelithiasis.   Electronically Signed   By: Lauralyn Primes M.D.   On: 06/01/2019 13:26   Suspect given age greater than 65, Velcro crackles at the base, previous description of probable UIP and nearly negative serology [psoriatic arthritis does not count is connective tissue disease ILD], vocal cord paralysis with some aspiration risk that this is IPF. I am worried that his worsening cough reflects progression although this could just be worsening acid reflux.  I think he would benefit from high-resolution CT scan and pulmonary function test for restaging but also get a GI referral.  He wants to stay within the [facility] health medical group family for his GI services    .OV 12/18/2020  Subjective:  Patient ID: Alan Roman, male , DOB: 1945-11-23 , age 60 y.o. , MRN: [id number] , ADDRESS: [address] [place] [address] PCP Ignatius Specking, MD Patient Care Team: Ignatius Specking, MD as PCP - General (Internal Medicine) Yates Decamp, MD as PCP - Cardiology (Cardiology)  This Provider for this visit: Treatment Team:  Attending Provider: Kalman Shan, MD    12/18/2020 -   Chief Complaint  Patient presents with  . Follow-up    PFT performed today, HRCT performed 4/25, and EGD scheduled 5/23.  Pt states he has been okay since last visit. States his only concern is hoarseness with voice and an occ cough.     HPI Alan Roman 75 y.o. -returns for follow-up to discuss his test results.  He continues to have hoarse voice.  He denies any dysphagia.  He is now scheduled for endoscopy 12/21/2020.  He is going to see cardiology ahead of that.  His serologies negative.  His symptoms continue to be minimal and stable.  He had high-resolution CT scan of the chest that shows progression compared to October 2020.  It is described as probable UIP.  Back in October 2020 there was craniocaudal gradient described and no air trapping.  This time April 2022 radiologist feels there is  slight upper lobe predominance and there is air trapping.  This latter description raises the question of possible chronic hypersensitive pneumonitis even though the final conclusion was probable UIP.  I went over exposure history with him again.  No organic antigen exposure history identified.  Nevertheless disease appears progressive.    SYMPTOM SCALE - ILD 11/10/2020 and 12/18/2020    O2 use ra  Shortness of Breath 0 -> 5 scale with 5 being worst (score 6 If unable to do)  At rest 0  Simple tasks - showers, clothes change,  eating, shaving 0  Household (dishes, doing bed, laundry) 0  Shopping 0  Walking level at own pace 1  Walking up Stairs 1  Total (30-36) Dyspnea Score 2  How bad is your cough? 1  How bad is your fatigue x  How bad is nausea 0  How bad is vomiting?  0  How bad is diarrhea? 0  How bad is anxiety? x  How bad is depression x        Simple office walk 185 feet x  3 laps goal with forehead probe 11/10/2020   O2 used ra  Number laps completed 3  Comments about pace avg  Resting Pulse Ox/HR 100% and 66/min  Final Pulse Ox/HR 100% and 103/min  Desaturated </= 88% no  Desaturated <= 3% points no  Got Tachycardic >/= 90/min yes  Symptoms at end of test none  Miscellaneous comments x      PFT  PFT Results Latest Ref Rng & Units 12/18/2020 07/19/2019  FVC-Pre L 1.98 2.32  FVC-Predicted Pre % 59 63  FVC-Post L 2.01 2.06  FVC-Predicted Post % 60 56  Pre FEV1/FVC % % 89 83  Post FEV1/FCV % % 92 93  FEV1-Pre L 1.76 1.94  FEV1-Predicted Pre % 69 69  FEV1-Post L 1.84 1.93  DLCO uncorrected ml/min/mmHg 14.24 13.59  DLCO UNC% % 50 43  DLCO corrected ml/min/mmHg 14.24 -  DLCO COR %Predicted % 50 -  DLVA Predicted % 104 90  TLC L 3.60 3.80  TLC % Predicted % 55 55  RV % Predicted % 34 60   CT chest HIgh res 11/23/20   IMPRESSION: CLINICAL DATA:  75 year old male with history of bronchiectasis. Follow-up study.  EXAM: CT CHEST WITHOUT  CONTRAST  TECHNIQUE: Multidetector CT imaging of the chest was performed following the standard protocol without intravenous contrast. High resolution imaging of the lungs, as well as inspiratory and expiratory imaging, was performed.  COMPARISON:  Chest CT 05/31/2019.  FINDINGS: Cardiovascular: Heart size is normal. There is no significant pericardial fluid, thickening or pericardial calcification. There is aortic atherosclerosis, as well as atherosclerosis of the great vessels of the mediastinum and the coronary arteries, including calcified atherosclerotic plaque in the left main, left anterior descending and left circumflex coronary arteries.  Mediastinum/Nodes: No pathologically enlarged mediastinal or hilar lymph nodes. Please note that accurate exclusion of hilar adenopathy is limited on noncontrast CT scans. Esophagus is unremarkable in appearance. No axillary lymphadenopathy.  Lungs/Pleura: High-resolution images demonstrate widespread patchy regions of ground-glass attenuation, septal thickening, mild cylindrical bronchiectasis and peripheral bronchiolectasis. Findings have no discernible craniocaudal gradient, and there is significant involvement in the anterior aspects of the upper lobes of the lungs bilaterally. No frank honeycombing. Inspiratory and expiratory imaging demonstrates some mild air trapping indicative of mild small airways disease. Minimal progression compared to the prior examination. No acute consolidative airspace disease. No pleural effusions. No suspicious appearing pulmonary nodules or masses are noted.  Upper Abdomen: Aortic atherosclerosis. 5 mm calcified gallstone in the neck of the gallbladder. Small calcified granuloma in the spleen incidentally noted.  Musculoskeletal: There are no aggressive appearing lytic or blastic lesions noted in the visualized portions of the skeleton 1. The appearance of the lungs is compatible with  interstitial lung disease, with a spectrum of findings once again categorized as probable usual interstitial pneumonia (UIP) per current ATS guidelines. Minimal progression of disease compared to the prior examination. 2. Aortic atherosclerosis, in addition to left main and 2 vessel coronary  artery disease. Assessment for potential risk factor modification, dietary therapy or pharmacologic therapy may be warranted, if clinically indicated. 3. Cholelithiasis.  Aortic Atherosclerosis (ICD10-I70.0).   Electronically Signed   By: Trudie Reedaniel  Entrikin M.D.   On: 11/24/2020 08:04   xxxx Results for Norvel RichardsDHEKAR, Ayodele "JAGDISH" (MRN [id number]) as of 12/18/2020 16:47  Ref. Range 06/03/2019 10:42  Aspergillus Fumigatus Latest Ref Range: NEGATIVE  NEGATIVE  Pigeon Serum Latest Ref Range: NEGATIVE  NEGATIVE  Anti Nuclear Antibody (ANA) Latest Ref Range: NEGATIVE  POSITIVE (A)  ANA Pattern 1 Unknown Nuclear, Homogeneous (A)  ANA Titer 1 Latest Units: titer 1:40 (H)  Anti JO-1 Latest Ref Range: 0.0 - 0.9 AI <0.2  CENTROMERE AB SCREEN Latest Ref Range: <1.0 NEG AI <1.0 NEG  RA Latex Turbid. Latest Ref Range: <14 IU/mL 14 (H)  SSA (Ro) (ENA) Antibody, IgG Latest Ref Range: <1.0 NEG AI <1.0 NEG  SSB (La) (ENA) Antibody, IgG Latest Ref Range: <1.0 NEG AI <1.0 NEG  Scleroderma (Scl-70) (ENA) Antibody, IgG Latest Ref Range: <1.0 NEG AI <1.0 NEG     has a past medical history of Acute bronchiolitis due to other infectious organisms (01/26/2013), Arthritis, Extrapulmonary TB (tuberculosis) (02/02/2013), High risk medication use (12/27/2016), History of hiatal hernia (2007; 2009), Hypertension, and Pleural effusion (01/26/2013).   reports that he has never smoked. He has never used smokeless tobacco.  Past Surgical History:  Procedure Laterality Date  . CATARACT EXTRACTION W/ INTRAOCULAR LENS  IMPLANT, BILATERAL Bilateral   . HERNIA REPAIR    . HIATAL HERNIA REPAIR  2007; 2009  . JOINT REPLACEMENT     . TOTAL KNEE ARTHROPLASTY Left 11/03/2015  . TOTAL KNEE ARTHROPLASTY Left 11/03/2015   Procedure: Left Total Knee Arthroplasty;  Surgeon: Valeria BatmanPeter W Whitfield, MD;  [facility];  Service: Orthopedics;  Laterality: Left;  . TOTAL KNEE ARTHROPLASTY Right 07/17/2018   Procedure: RIGHT TOTAL KNEE ARTHROPLASTY;  Surgeon: Valeria BatmanWhitfield, Alan W, MD;  [facility];  Service: Orthopedics;  Laterality: Right;  Marland Kitchen. VIDEO BRONCHOSCOPY Bilateral 01/31/2013   Procedure: VIDEO BRONCHOSCOPY WITHOUT FLUORO;  Surgeon: Merwyn Katosavid B Simonds, MD;  [facility];  Service: Cardiopulmonary;  Laterality: Bilateral;    No Known Allergies  Immunization History  Administered Date(s) Administered  . Influenza, High Dose Seasonal PF 04/22/2019  . PPD Test 04/06/2010  . Tdap 05/30/2017    Family History  Problem Relation Age of Onset  . Colon cancer Neg Hx   . Stomach cancer Neg Hx   . Esophageal cancer Neg Hx      Current Outpatient Medications:  .  clobetasol cream (TEMOVATE) 0.05 %, Apply 1 application topically 2 (two) times daily., Disp: 45 g, Rfl: 1 .  losartan (COZAAR) 25 MG tablet, Take 1 tablet (25 mg total) by mouth daily., Disp: 20 tablet, Rfl: 0 .  multivitamin-iron-minerals-folic acid (CENTRUM) chewable tablet, Chew 1 tablet by mouth every 3 (three) days., Disp: , Rfl:  .  omeprazole (PRILOSEC) 20 MG capsule, TAKE 1 CAPSULE(20 MG) BY MOUTH DAILY, Disp: 90 capsule, Rfl: 0 .  rosuvastatin (CRESTOR) 5 MG tablet, Take 1 tablet (5 mg total) by mouth daily., Disp: 90 tablet, Rfl: 3 .  STELARA 45 MG/0.5ML SOSY injection, Inject 1 mL into the skin every 3 (three) months., Disp: , Rfl:  .  Triamcinolone Acetonide (TRIAMCINOLONE 0.1 % CREAM : EUCERIN) CREA, Apply 1 application topically 2 (two) times daily., Disp: 1 each, Rfl: 0      Objective:   Vitals:  12/18/20 1555  BP: (!) 110/58  Pulse: 62  Temp: 98.7 F (37.1 C)  TempSrc: Temporal  SpO2: 98%  Weight: 155 lb (70.3 kg)  Height: 5\' 7"   (1.702 m)    Estimated body mass index is 24.28 kg/m as calculated from the following:   Height as of this encounter: 5\' 7"  (1.702 m).   Weight as of this encounter: 155 lb (70.3 kg).  @WEIGHTCHANGE @    12/18/20 1555  Weight: 155 lb (70.3 kg)     Physical Exam  Thin male with crackles.  Hoarse voice.       Assessment:       ICD-10-CM   1. IPF (idiopathic pulmonary fibrosis) (HCC)  J84.112   2. At risk for aspiration  Z91.89   3. Vocal cord atrophy  J38.3   4. Immunosuppressed status (HCC)  D84.9    I think the overwhelming evidence here is for IPF is a specific variety of pulmonary fibrosis.  This is based on age greater than 1, male gender, progressive phenotype, negative serology, aspiration risk and probable UIP.  Alternative differential diagnosis includes chronic hypersensitive pneumonitis based on the fact some upper lobe predominance as described in the current CT along with air trapping although no exposure identified.  Immunosuppressive related opportunistic infections another possibility although highly unlikely.  At this point I would want him to finish his cardiology and GI work-up.  The approved drug for both IPF and non-IPF progressive phenotype is nintedanib.  For IPF pirfenidone is also approved.  I went over these.  Discussed both drugs as being equally effective for the respective indications.  Side effect profile differentiate between the 2 drugs.  He prefers nintedanib because he prefers to accept the diarrhea side effect.  He does not have any diverticulitis or bleeding issues of being on anticoagulation or heart attack.  No contraindications identified.  Went over payment and approval process.  At some point in time we will probably do a bronchoscopy with lavage to ensure there is no opportunistic infection and no lymphocytes to explain high percent pneumonitis.  In case this ends up being hypersensitive pneumonitis based on suspected  lymphocytosis then we will have to entertain biopsy and immune O suppressive prednisone.    Plan:     Patient Instructions     ICD-10-CM   1. IPF (idiopathic pulmonary fibrosis) (HCC)  J84.112   2. At risk for aspiration  Z91.89   3. Vocal cord atrophy  J38.3   4. Immunosuppressed status (HCC)  D84.9    The clinical diagnosis I am giving to you is idiopathic pulmonary fibrosis [IPF]  - There is a specific variety of pulmonary fibrosis. I am basing this diagnosis based on age greater than 25, male gender, negative serology blood test, progression with time and also probable UIP pattern as described on CT scan   -  There is a small possibility that you have another variety of pulmonary fibrosis called chronic hypersensitivity pneumonitis based on descriptions of air trapping by radiology.  Regardless it is progressive   -Differentiating between these 2 requires lung biopsy but I want to try to avoid this if possible   -There is lot of common overlap in treating IPF and progressive chronic hypersensitive pneumonitis  Plan  - Start nintedanib per protocol [antifibrotic]  -We extensively discussed the options of nintedanib versus pirfenidone.  Based on the side effect profile we took a shared decision making to start nintedanib  -Refer to our  pharmacist Chesley Mires to discuss nintedanib -Complete GI work-up -I will review your scan in the multidisciplinary case conference and if needed based on the results of that conference at at some point in the future we can consider bronchoscopy with lavage to differentiate between hypersensitive pneumonitis and IPF [but will definitely try to avoid biopsy]  Follow-up - 6 weeks with Dr. Marchelle Gearing to discuss tolerance to nintedanib [hopefully started by then]  -ILD symptom score and simple walking desaturation test at follow-up   ( Level 05 visit: Estb 40-54 min   in  visit type: on-site physical face to visit  in total care time and counseling  or/and coordination of care by this undersigned MD - Dr Kalman Shan. This includes one or more of the following on this same day 12/18/2020: pre-charting, chart review, note writing, documentation discussion of test results, diagnostic or treatment recommendations, prognosis, risks and benefits of management options, instructions, education, compliance or risk-factor reduction. It excludes time spent by the CMA or office staff in the care of the patient. Actual time 40 min)   SIGNATURE    Dr. Kalman Shan, M.D., F.C.C.P,  Pulmonary and Critical Care Medicine Staff Physician, [facility] Director - Interstitial Lung Disease  Program  Pulmonary Fibrosis [facility] [place], [place], [address]  Pager: [phone], If no answer or between  15:00h - 7:00h: call [phone] Telephone: [phone]  5:21 PM 12/18/2020

## 2020-12-18 NOTE — Patient Instructions (Addendum)
ICD-10-CM   1. IPF (idiopathic pulmonary fibrosis) (HCC)  J84.112   2. At risk for aspiration  Z91.89   3. Vocal cord atrophy  J38.3   4. Immunosuppressed status (HCC)  D84.9    The clinical diagnosis I am giving to you is idiopathic pulmonary fibrosis [IPF]  - There is a specific variety of pulmonary fibrosis. I am basing this diagnosis based on age greater than 71, male gender, negative serology blood test, progression with time and also probable UIP pattern as described on CT scan   -  There is a small possibility that you have another variety of pulmonary fibrosis called chronic hypersensitivity pneumonitis based on descriptions of air trapping by radiology.  Regardless it is progressive   -Differentiating between these 2 requires lung biopsy but I want to try to avoid this if possible   -There is lot of common overlap in treating IPF and progressive chronic hypersensitive pneumonitis  Plan  - Start nintedanib per protocol [antifibrotic]  -We extensively discussed the options of nintedanib versus pirfenidone.  Based on the side effect profile we took a shared decision making to start nintedanib  -Refer to our pharmacist Chesley Mires to discuss nintedanib -Complete GI work-up -I will review your scan in the multidisciplinary case conference and if needed based on the results of that conference at at some point in the future we can consider bronchoscopy with lavage to differentiate between hypersensitive pneumonitis and IPF [but will definitely try to avoid biopsy]  Follow-up - 6 weeks with Dr. Marchelle Gearing to discuss tolerance to nintedanib [hopefully started by then]  -ILD symptom score and simple walking desaturation test at follow-up

## 2020-12-18 NOTE — Progress Notes (Signed)
Full PFT performed today. °

## 2020-12-18 NOTE — Patient Instructions (Signed)
Full PFT performed today. °

## 2020-12-21 ENCOUNTER — Other Ambulatory Visit (HOSPITAL_COMMUNITY): Payer: Self-pay

## 2020-12-21 ENCOUNTER — Ambulatory Visit: Payer: Medicare Other

## 2020-12-21 ENCOUNTER — Other Ambulatory Visit: Payer: Self-pay

## 2020-12-21 ENCOUNTER — Telehealth: Payer: Self-pay

## 2020-12-21 DIAGNOSIS — R0609 Other forms of dyspnea: Secondary | ICD-10-CM

## 2020-12-21 DIAGNOSIS — I2510 Atherosclerotic heart disease of native coronary artery without angina pectoris: Secondary | ICD-10-CM

## 2020-12-21 DIAGNOSIS — I700 Atherosclerosis of aorta: Secondary | ICD-10-CM

## 2020-12-21 DIAGNOSIS — I2729 Other secondary pulmonary hypertension: Secondary | ICD-10-CM

## 2020-12-21 DIAGNOSIS — R0600 Dyspnea, unspecified: Secondary | ICD-10-CM

## 2020-12-21 NOTE — Telephone Encounter (Signed)
Submitted a Prior Authorization request to [facility] for OFEV via CoverMyMeds. Will update once we receive a response.   Key: Si Raider

## 2020-12-21 NOTE — Telephone Encounter (Signed)
Received notification from [facility] regarding a prior authorization for OFEV. Authorization has been APPROVED from 12/21/20 to 12/21/2021.   Authorization # [id number]  Attempted test claim--PA approval has not yet adjudicated at pharmacy level, will reattempt shortly. Will finalize and fax PAP forms pending test claim results.

## 2020-12-22 ENCOUNTER — Other Ambulatory Visit (HOSPITAL_COMMUNITY): Payer: Self-pay

## 2020-12-22 ENCOUNTER — Ambulatory Visit: Payer: Medicare Other

## 2020-12-22 DIAGNOSIS — R0609 Other forms of dyspnea: Secondary | ICD-10-CM

## 2020-12-22 DIAGNOSIS — R0600 Dyspnea, unspecified: Secondary | ICD-10-CM

## 2020-12-22 DIAGNOSIS — I2729 Other secondary pulmonary hypertension: Secondary | ICD-10-CM

## 2020-12-22 DIAGNOSIS — I2510 Atherosclerotic heart disease of native coronary artery without angina pectoris: Secondary | ICD-10-CM

## 2020-12-22 DIAGNOSIS — I700 Atherosclerosis of aorta: Secondary | ICD-10-CM

## 2020-12-22 NOTE — Telephone Encounter (Signed)
Test claim successful; Insurance covers [phone] and leaves pt with copay of $589.47. Appears to be in Catastrophic Coverage. Awaiting pt portion of PAP forms, sent home with pt on 12/18/20.

## 2020-12-23 NOTE — Progress Notes (Signed)
Echocardiogram 12/22/2020: Left ventricle cavity is normal in size. Mild concentric hypertrophy of the left ventricle. Normal global wall motion. Normal LV systolic function with EF 63%. Doppler evidence of grade I (impaired) diastolic dysfunction, normal LAP.  Left atrial cavity is normal in size. Aneurysmal interatrial septum without 2D or color Doppler evidence of shunting. Trileaflet aortic valve.  Mild (Grade I) aortic regurgitation. Mild aortic valve leaflet calcification. Mild (Grade I) mitral regurgitation. Mild to moderate tricuspid regurgitation. Estimated pulmonary artery systolic pressure 30 mmHg.  Mild pulmonic regurgitation.

## 2020-12-23 NOTE — Progress Notes (Signed)
Please set up OV wit me, not urgent. Dyspnea.  Alan Roman, let him know stress and echo normal limits. JG

## 2020-12-29 ENCOUNTER — Other Ambulatory Visit: Payer: Self-pay

## 2020-12-29 ENCOUNTER — Ambulatory Visit (INDEPENDENT_AMBULATORY_CARE_PROVIDER_SITE_OTHER): Payer: Medicare Other | Admitting: Pharmacist

## 2020-12-29 DIAGNOSIS — J84112 Idiopathic pulmonary fibrosis: Secondary | ICD-10-CM

## 2020-12-29 DIAGNOSIS — Z5181 Encounter for therapeutic drug level monitoring: Secondary | ICD-10-CM

## 2020-12-29 DIAGNOSIS — Z7189 Other specified counseling: Secondary | ICD-10-CM

## 2020-12-29 LAB — CBC WITH DIFFERENTIAL/PLATELET
Basophils Absolute: 0 10*3/uL (ref 0.0–0.1)
Basophils Relative: 0.6 % (ref 0.0–3.0)
Eosinophils Absolute: 0.2 10*3/uL (ref 0.0–0.7)
Eosinophils Relative: 3 % (ref 0.0–5.0)
HCT: 38.9 % — ABNORMAL LOW (ref 39.0–52.0)
Hemoglobin: 13.6 g/dL (ref 13.0–17.0)
Lymphocytes Relative: 26.4 % (ref 12.0–46.0)
Lymphs Abs: 1.5 10*3/uL (ref 0.7–4.0)
MCHC: 35 g/dL (ref 30.0–36.0)
MCV: 94.8 fl (ref 78.0–100.0)
Monocytes Absolute: 0.6 10*3/uL (ref 0.1–1.0)
Monocytes Relative: 9.9 % (ref 3.0–12.0)
Neutro Abs: 3.4 10*3/uL (ref 1.4–7.7)
Neutrophils Relative %: 60.1 % (ref 43.0–77.0)
Platelets: 156 10*3/uL (ref 150.0–400.0)
RBC: 4.11 Mil/uL — ABNORMAL LOW (ref 4.22–5.81)
RDW: 12.6 % (ref 11.5–15.5)
WBC: 5.6 10*3/uL (ref 4.0–10.5)

## 2020-12-29 LAB — COMPREHENSIVE METABOLIC PANEL
ALT: 18 U/L (ref 0–53)
AST: 27 U/L (ref 0–37)
Albumin: 3.9 g/dL (ref 3.5–5.2)
Alkaline Phosphatase: 77 U/L (ref 39–117)
BUN: 17 mg/dL (ref 6–23)
CO2: 27 mEq/L (ref 19–32)
Calcium: 8.6 mg/dL (ref 8.4–10.5)
Chloride: 105 mEq/L (ref 96–112)
Creatinine, Ser: 1.09 mg/dL (ref 0.40–1.50)
GFR: 66.05 mL/min (ref >60.00)
Glucose, Bld: 127 mg/dL — ABNORMAL HIGH (ref 70–99)
Potassium: 4.1 mEq/L (ref 3.5–5.1)
Sodium: 139 mEq/L (ref 135–145)
Total Bilirubin: 0.5 mg/dL (ref 0.2–1.2)
Total Protein: 7.3 g/dL (ref 6.0–8.3)

## 2020-12-29 NOTE — Progress Notes (Signed)
Subjective:  [name] presents today to [facility] to see pharmacy team for Ofev new start counseling. His daughter, Alan Roman, was able to join via telephone to discuss Ofev.  Patient was last seen and referred by Dr. Chase Caller on 12/18/20.  He has IPF diagnosis.  Pertinent past medical history includes: psoriatic arthritis and psoriasis overlap (on Stelara, triamcinolone, and clobetasol ointments). HTN (losartan), and vocal cord atrophy. He was diagnosed with tuberculosis in July 2014 for which he received treatment x 3 months.  History of CAD: none History of elevated LFTs: none History of diarrhea, nausea, vomiting: none, does have history of hiatal hernia for which he had hiatal repair surgery. No history of or current report of GI bleeding or diverticulitis.  Objective: No Known Allergies  Outpatient Encounter Medications as of 12/29/2020  Medication Sig  . clobetasol cream (TEMOVATE) 6.83 % Apply 1 application topically 2 (two) times daily.  Marland Kitchen losartan (COZAAR) 25 MG tablet Take 1 tablet (25 mg total) by mouth daily.  . multivitamin-iron-minerals-folic acid (CENTRUM) chewable tablet Chew 1 tablet by mouth every 3 (three) days.  Marland Kitchen omeprazole (PRILOSEC) 20 MG capsule TAKE 1 CAPSULE(20 MG) BY MOUTH DAILY  . rosuvastatin (CRESTOR) 5 MG tablet Take 1 tablet (5 mg total) by mouth daily.  Delsa Grana 45 MG/0.5ML SOSY injection Inject 1 mL into the skin every 3 (three) months.  . Triamcinolone Acetonide (TRIAMCINOLONE 0.1 % CREAM : EUCERIN) CREA Apply 1 application topically 2 (two) times daily.   No facility-administered encounter medications on file as of 12/29/2020.     Immunization History  Administered Date(s) Administered  . Influenza, High Dose Seasonal PF 04/22/2019  . PPD Test 04/06/2010  . Tdap 05/30/2017    PFT's TLC  Date Value Ref Range Status  12/18/2020 3.60 L Preliminary    CMP     Component Value Date/Time   NA 141 05/21/2020 1028   K 4.2 05/21/2020 1028    CL 106 05/21/2020 1028   CO2 27 05/21/2020 1028   GLUCOSE 83 05/21/2020 1028   BUN 17 05/21/2020 1028   CREATININE 1.11 05/21/2020 1028   CREATININE 1.16 09/11/2019 0843   CALCIUM 8.6 (L) 05/21/2020 1028   PROT 6.9 05/21/2020 1028   ALBUMIN 3.5 05/21/2020 1028   AST 35 05/21/2020 1028   ALT 25 05/21/2020 1028   ALKPHOS 76 05/21/2020 1028   BILITOT 1.0 05/21/2020 1028   GFRNONAA >60 05/21/2020 1028   GFRNONAA 62 09/11/2019 0843   GFRAA >60 02/27/2020 1008   GFRAA 71 09/11/2019 0843      CBC    Component Value Date/Time   WBC 5.1 05/21/2020 1028   RBC 4.08 (L) 05/21/2020 1028   HGB 13.1 05/21/2020 1028   HCT 39.6 05/21/2020 1028   PLT 161 05/21/2020 1028   MCV 97.1 05/21/2020 1028   MCH 32.1 05/21/2020 1028   MCHC 33.1 05/21/2020 1028   RDW 12.2 05/21/2020 1028   LYMPHSABS 1.2 05/21/2020 1028   MONOABS 0.6 05/21/2020 1028   EOSABS 0.6 (H) 05/21/2020 1028   BASOSABS 0.0 05/21/2020 1028    LFT's Hepatic Function Latest Ref Rng & Units 05/21/2020 02/27/2020 09/11/2019  Total Protein 6.5 - 8.1 g/dL 6.9 7.1 7.3  Albumin 3.5 - 5.0 g/dL 3.5 3.5 -  AST 15 - 41 U/L 35 36 28  ALT 0 - 44 U/L '25 19 17  ' Alk Phosphatase 38 - 126 U/L 76 92 -  Total Bilirubin 0.3 - 1.2 mg/dL 1.0 1.1 0.7  Bilirubin, Direct 0.1 - 0.5 mg/dL - - -    HRCT (11/23/20): appearance of the lungs is compatible with interstitial lung disease, with a spectrum of findings once again categorized as probable usual interstitial pneumonia (UIP) per current ATS guidelines  Assessment and Plan  1. Ofev Medication Management Thoroughly counseled patient on the efficacy, mechanism of action, dosing, administration, adverse effects, and monitoring parameters of Ofev. Patient verbalized understanding. Patient education handout provided.   Goals of therapy: . Will not stop or reverse the progression of ILD. It will SLOW the progression of ILD.  Marland Kitchen Discussed that HRCT and PFTs are used to assess clinical response to Ofev  and progression of IPF.  Dose will be: 150 mg (one capsule) by mouth twice daily (approx 12 hours apart) (normal renal function and hepatic function)  Counseling points: . Take with food approximately 12 hours apart . Diarrhea is reversible upon discontinuation of mediation. Can take loperamide for max 48 hours at first sign of loose stool. . Reviewed side effects including N/V/D, abdominal pain, decreased appetite, weight loss, increased blood pressure (has baseline HTN) . Reviewed serious risk of GI perforation/bleeding and CAD events. . He is interested in [facility] for Ofev patients once approved. Will fax completed form to [employer] once patient is approved.  Monitoring: . Monitor for diarrhea, nausea and vomiting, GI perforation/bleeding, hepatotoxicity. . Monitor LFTs (baseline, monthly for first 6 months, then every 3 months thereafter and as clinically indicated) . Monitor CBC every 3 months . Baseline CMP and CBC drawn today to prevent delay once approved for Ofev.  Access:  Prior auth for Ofev approved however we have to pursue patient assistance d/t cost. He completed and brought [employer] pt assistance paperwork today. Provider portion is ready to go. Will submit patient assistance application manufacturer today and contact patient once approved.  2. Medication Reconciliation A drug regimen assessment was performed, including review of allergies, interactions, disease-state management, dosing and immunization history. Medications were reviewed with the patient, including name, instructions, indication, goals of therapy, potential side effects, importance of adherence, and safe use.  No drug-drug interactions with Ofev based on current medication list. Family was concerned specifically about interaction with Stelara for which there is no concern.  No drug-disease interactions - no history of CV events, but regardless has been counseled on  this  3. Immunizations Patient is indicated for the influenzae, pneumonia, and shingles vaccinations.  Patient provided with phone number to pharmacy team for any further questions that he and his family have about Ofev.  He has f/u with Dr. Chase Caller already scheduled on 01/21/21.  Thank you for involving pharmacy to assist in providing this patient's care.   Knox Saliva, PharmD, MPH Clinical Pharmacist (Rheumatology and Pulmonology)

## 2020-12-29 NOTE — Progress Notes (Signed)
Called and spoke with patient regarding his results. Called transferred to front desk staff (KP) to schedule FU appointment.

## 2020-12-29 NOTE — Telephone Encounter (Signed)
Submitted Patient Assistance Application to [facility] for OFEV along with provider portion, patient portion, and income documents. Will update patient when we receive a response.  Fax# [phone] Phone# [phone]

## 2020-12-30 ENCOUNTER — Other Ambulatory Visit: Payer: Medicare Other

## 2021-01-01 NOTE — Telephone Encounter (Signed)
Attempted to call pt, line went straight to VM and the VM box is full. Called and successfully left VM with Son requesting a call back here at the clinic.

## 2021-01-01 NOTE — Telephone Encounter (Signed)
Called and spoke to rep at [facility], they left a voicemail stating that the patient will need to reach out to the Social Security department to apply for--and be denied--a [employer]. He has been granted temporary approval in the meantime, which allows for 2 months of medication at no cost. Patient will need to get in contact with [employer] and coordinate delivery, as he will need to be home to sign for the package. A letter was also sent out containing this information. Will need to reach out to pt to verify understanding of next steps.

## 2021-01-04 NOTE — Telephone Encounter (Signed)
Pt returned phone call. Next steps explained and pt verbalized understanding. Requested pt reach back out to clinic for assistance if any questions or issues may arise.

## 2021-01-14 ENCOUNTER — Other Ambulatory Visit: Payer: Self-pay

## 2021-01-14 DIAGNOSIS — I10 Essential (primary) hypertension: Secondary | ICD-10-CM

## 2021-01-14 MED ORDER — LOSARTAN POTASSIUM 25 MG PO TABS: 25.0000 mg | ORAL_TABLET | Freq: Every day | ORAL | 0 refills | Status: DC

## 2021-01-21 ENCOUNTER — Other Ambulatory Visit: Payer: Self-pay

## 2021-01-21 ENCOUNTER — Encounter: Payer: Self-pay | Admitting: Internal Medicine

## 2021-01-21 ENCOUNTER — Ambulatory Visit: Payer: Medicare Other | Admitting: Internal Medicine

## 2021-01-21 VITALS — BP 112/60 | HR 63 | Ht 67.0 in | Wt 158.0 lb

## 2021-01-21 DIAGNOSIS — J84112 Idiopathic pulmonary fibrosis: Secondary | ICD-10-CM | POA: Diagnosis not present

## 2021-01-21 DIAGNOSIS — Z9189 Other specified personal risk factors, not elsewhere classified: Secondary | ICD-10-CM | POA: Diagnosis not present

## 2021-01-21 DIAGNOSIS — L4050 Arthropathic psoriasis, unspecified: Secondary | ICD-10-CM

## 2021-01-21 DIAGNOSIS — D849 Immunodeficiency, unspecified: Secondary | ICD-10-CM

## 2021-01-21 DIAGNOSIS — J383 Other diseases of vocal cords: Secondary | ICD-10-CM

## 2021-01-21 NOTE — Patient Instructions (Addendum)
ICD-10-CM   1. IPF (idiopathic pulmonary fibrosis) (HCC)  J84.112   2. At risk for aspiration  Z91.89   3. Vocal cord atrophy  J38.3   4. Immunosuppressed status (HCC)  D84.9    The clinical diagnosis I am giving to you is idiopathic pulmonary fibrosis [IPF]  - There is a specific variety of pulmonary fibrosis. I am basing this diagnosis based on age greater than 56, male gender, negative serology blood test, progression with time and also probable UIP pattern as described on CT scan   -  There is a small possibility that you have another variety of pulmonary fibrosis called chronic hypersensitivity pneumonitis based on descriptions of air trapping by radiology.  Regardless it is progressive   -Differentiating between these 2 requires lung biopsy but I want to try to avoid this if possible   -There is lot of common overlap in treating IPF and progressive chronic hypersensitive pneumonitis   Still awaiting ofev supply  Plan  - Start OFEV per protocol [antifibrotic] - REFER ENT Dr Suszanne Conners for Vocal cord issues -Complete GI work-up if not done so far -I will review your scan in the multidisciplinary case conference and if needed based on the results of that conference at at some point in the future we can consider bronchoscopy with lavage to differentiate between hypersensitive pneumonitis and IPF [but will definitely try to avoid biopsy]  Follow-up - 4 weeks tele visit with APP to discuss how ofev is going - 10-12 weeks with Dr. Marchelle Roman 30 min visit  -ILD symptom score and simple walking desaturation test at follow-up

## 2021-01-21 NOTE — Progress Notes (Signed)
NOV 2020 with Dr Alan Roman  75 year old never smoker of [place] origin initial consult 05/2019  for evaluation of hoarseness, dry cough and congestion. He is originally from [place], [place], [place] to the [place] in 1971 for his bachelors to [employer] and then settled in [place] in 1978 and got into the [employer].  PMH - - psoriatic arthritis by Dr. Metta Roman, was originally on Humira and is now on Cosentyx. - latent TB that was untreated initially and in 12/2012 he presented with a mild alveolitis on CT scan and pleural effusion that required thoracentesis, bronchoscopy was negative for AFB but he was treated under the guidance of local health department and ID with multiple drug antituberculous treatment for 3 months.  His problem lists extrapulmonary TB but on my review all AFB cultures were negative.   -07/2018 chest x-ray shows eventration of right hemidiaphragm.   - vocal cord paralysis in 04/2015 and underwent ENT evaluation for persistent dysphonia and underwent gel injection of the vocal cords at [facility] without any relief  Chief Complaint  Patient presents with   Follow-up    1 month rov- review CT chest 10/31.  pt states his s/s are unchanged since last month, prod cough with clear mucus qam that subsides throughout the day.      Symptoms are persistent. Main issues are hoarseness, dry cough and congestion  We reviewed HRCT in detail today  CT angiogram 10/2015 -which shows mild reticular densities suggestive but not diagnostic of ILD.  HRCT 05/31/19 mild, bibasilar predominant pulmonary fibrosis in a pattern featuring subtle subpleural reticular interstitial opacity, mild traction bronchiectasis and occasional areas of Bronchiolectasis - ' probable UIP ' - slight worse   OV Alan Roman  75 year old male never smoker presenting to our office today after completing pulmonary function testing.  Patient was last seen in our office in October/2020  for initial consult with Dr. Elsworth Roman.  At that office visit patient was scheduled for pulmonary function testing, lab work was completed to further evaluate patient's probable UIP on October/2020 high-resolution CT chest, patient was referred to ear nose and throat, swallow studies were also ordered on the patient.  Patient completed ENT referral in 07/02/2019 with Dr. Wellington Roman Recommendations for voice therapy, that he follow-up with gastroenterology regarding this Schatzki's ring.  Follow-up in April.  Proceed forward with pulmonary pulmonary function test.  Patient reports that he is received a telephone call to restart voice rehab but he has not done this yet as he wanted to review pulmonary function testing with our office first.  Patient swallow studies are listed below:  06/06/2019-swallow study-mild aspiration risk, no treatment recommended at this time 06/07/2019-barium swallow-Schatzki ring at the GE junction which prevents passage of a 13 mm barium tablet  Patient has a gastroenterologist Dr. Collene Roman but he has not seen her in over a year.  Patient has no scheduled upcoming appointments.  Patient continues to follow-up with rheumatology, Dr. Estanislado Roman.  Per chart review last office note was in July/2020.  Patient presenting today to review December/2020 pulmonary function test.  Those results are listed below:  07/19/2019-pulmonary function test-FVC 2.32 (63% predicted), postbronchodilator ratio 93, postbronchodilator FEV1 1.93 (69% predicted), DLCO 13.59 (43% predicted) >>>Severe restriction, moderately severe diffusion defect  Lab work from [facility] are also listed below:  06/03/2019-ANA-1: 40, nuclear homogenous 06/03/2019-rheumatoid factor-14 06/03/2019-anti-Jo 1-less than 0.2 06/03/2019-aldolase-4.9 06/03/2019-centromere antibodies-negative 06/03/2019-antiscleroderma antibody-less than 1 - 06/03/2019-Sjogren's syndrome 8 and B-negative 06/03/2019-hypersensitivity  pneumonitis-negative 06/03/2019-sed rate  22  Mildly elevated ANA and rheumatoid factor.  Felt to be not clinically significant.  Rest of results are negative.  Patient continues to report that he is asymptomatic.  Patient tolerates walk in office without any oxygen desaturations.  He does continue to have occasional bouts of congestion which is believed to be related to the Cosentyx.  Family has messaged rheumatology regarding this.  Tests:   07/19/2019-pulmonary function test-FVC 2.32 (63% predicted), postbronchodilator ratio 93, postbronchodilator FEV1 1.93 (69% predicted), DLCO 13.59 (43% predicted) >>>Severe restriction, moderately severe diffusion defect  07/16/2019-SARS-CoV-2-not detected  06/03/2019-ANA-1: 40, nuclear homogenous 06/03/2019-rheumatoid factor-14 06/03/2019-anti-Jo 1-less than 0.2 06/03/2019-aldolase-4.9 06/03/2019-centromere antibodies-negative 06/03/2019-antiscleroderma antibody-less than 1 - 06/03/2019-Sjogren's syndrome 8 and B-negative 06/03/2019-hypersensitivity pneumonitis-negative 06/03/2019-sed rate 22  06/01/2019-CT chest high-res mild, bibasilar predominant pulmonary fibrosis in a pattern with featuring subtle subpleural reticular interstitial opacity and mild traction bronchiectasis occasional areas of bronchiolectasis, probable UIP  06/06/2019-swallow study-mild aspiration risk, no treatment recommended at this time  06/07/2019-barium swallow-Schatzki ring at the GE junction which prevents passage of a 13 mm barium tablet  FENO:  No results found for: NITRICOXIDE  MH: Psoriatic arthritis (Dr. Estanislado Roman - prev on Humira, now on Cosentyx), latent TB that was untreated initially in 12/2012 with mild alveolitis on CT scan and pleural effusion that required thoracentesis, bronchoscopy was negative for AFB but he was treated under guidance with local health department infectious disease with multidrug antituberculosis treatment for 3 months, eventration of right  hemidiaphragm, vocal cord paralysis in September/2016 underwent ENT evaluation for persistent dysphonia and underwent gel injection of vocal cords at [facility] without any relief Smoker/ Smoking History: Never smoker Maintenance: None   He is originally from [place], [place], [place] to the [place] in [place] for his bachelors to [employer] and then settled in [place] in 1978 and got into [employer] business.    OV 11/10/2020  Subjective:  Patient ID: Alan Roman, male , DOB: [birth year] , age 23 y.o. , MRN: [id number] , ADDRESS: [place] [id number] PCP Glenda Chroman, MD Patient Care Team: Glenda Chroman, MD as PCP - General (Internal Medicine)  This Provider for this visit: Treatment Team:  Attending Provider: Brand Males, MD    11/10/2020 -   Chief Complaint  Patient presents with   Follow-up    Increased coughing for past 3 months, sometimes productive with whitish sputum. Denies shortness of breath.      HPI [employer] 75 y.o. -asked by Dr. Einar Gip to establish at the pulmonary fibrosis center.  Therefore patient is seeing Dr. Chase Caller.  Patient used to be previously followed by Dr. Elsworth Roman.  Last seen by Dr. Elsworth Roman in 2020.  Then seen by nurse practitioner Alan Quaker in January 2021.   His daughtre Guilford Shi later joined on the phone call.  Patient is known to have ILD probable UIP pattern.  Is on observation at this point.   [place] Integrated Comprehensive ILD Questionnaire  Symptoms:  -   he reports insidious onset of dyspnea for the last 4 months.  Since it started it is the same.  Although his ILD is present even before that.  He says that he has a very mild cough however in the last 4 to 6 months it is worse.  Particularly when he talks for a long time.  He does not wake up in the middle of the night because of the cough he does bring up some white sputum that is very light white.  Cough does  not affect his voice.  He does clear his  throat.  He has never had hemoptysis.  He does not cough when he lies down.  No nausea vomiting or diarrhea.  No wheezing.  Cough does get worse with eating   SYMPTOM SCALE - ILD 11/10/2020   O2 use ra  Shortness of Breath 0 -> 5 scale with 5 being worst (score 6 If unable to do)  At rest 0  Simple tasks - showers, clothes change, eating, shaving 0  Household (dishes, doing bed, laundry) 0  Shopping 0  Walking level at own pace 1  Walking up Stairs 1  Total (30-36) Dyspnea Score 2  How bad is your cough? 1  How bad is your fatigue x  How bad is nausea 0  How bad is vomiting?  0  How bad is diarrhea? 0  How bad is anxiety? x  How bad is depression x       Past Medical History : He has psoriatic arthritis.  He is immunosuppressed and is on immunomodulators.  Currently on Simponi.  He does not have rheumatoid arthritis.  No heart failure.  No other collagen vascular disease of connective tissue disease.  No COPD.  No asthma.  No HIV.  No hiatal hernia.  No pulmonary hypertension.  No diabetes.  No thyroid disease.  No stroke no seizures.  No tuberculosis no kidney disease no pneumonia.  No blood clots no heart disease no pleurisy.   He is deemed to be an aspiration risk he has vocal cord paralysis.  He is to see Dr. Loreta [facility].  He wants to switch to [facility] but because of the pandemic has not been able to establish.  ROS: He does not have any fatigue.  No arthralgia.  No  dysphagia.  No Raynaud's.  No dry eyes no fever no weight loss.  No nausea no vomiting.  No heartburn no snoring no rash no ulcers.   FAMILY HISTORY of LUNG DISEASE: No pulmonary fibrosis.  No COPD.  No asthma no sarcoid no cystic fibrosis no hypersensitive pneumonitis no autoimmune disease in the family.    EXPOSURE HISTORY: No tobacco.  No smoking cigars no smoking pipes.  No electronic cigarette use.  No vaping marijuana.  No cocaine use.  No intravenous drug use.   HOME and HOBBY DETAILS : He lives in a  76 year old family home.  He is lived there for 40 years.  No damp environment.  No mold or mildew.  No humidifier use no CPAP use no nebulizer use.  No steam iron use.  No Jacuzzi use.  No misting [place].  No pet birds or parakeets.  No feather pillows or blankets.  No mold in the [place] Roman duct.  No music habits no gardening habits.  Never exposed to birds or chickens.  No water damage in the basement.  No straw meds.  No hot tub use.  No animals at work.    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  Organic antigen exposure history is negative.  Inorganic antigen exposure history is negative.   PULMONARY TOXICITY HISTORY (27 items):  Simponi for psoriatic arthritis     PFT  PFT Results Latest Ref Rng & Units 07/19/2019  FVC-Pre L 2.32  FVC-Predicted Pre % 63  FVC-Post L 2.06  FVC-Predicted Post % 56  Pre FEV1/FVC % % 83  Post FEV1/FCV % % 93  FEV1-Pre L 1.94  FEV1-Predicted Pre % 69  FEV1-Post L 1.93  DLCO uncorrected ml/min/mmHg 13.59  DLCO UNC% % 43  DLVA Predicted % 90  TLC L 3.80  TLC % Predicted % 55  RV % Predicted % 60    HRCT Oct 2020   IMPRESSION: 1. There is mild, bibasilar predominant pulmonary fibrosis in a pattern featuring subtle subpleural reticular interstitial opacity, mild traction bronchiectasis and occasional areas of bronchiolectasis, for example in the lateral segment right middle lobe and right lung base (series 4, image 176). These findings are very slightly worsened in comparison to prior examination dated 11/10/2015. Findings are consistent with mild pulmonary fibrosis in a probable UIP pattern. Findings are categorized as probable UIP per consensus guidelines: Diagnosis of Idiopathic Pulmonary Fibrosis: An Official ATS/ERS/JRS/ALAT Clinical Practice Guideline. Am Rosezetta Schlatter Crit Care Med Vol 198, Iss 5, [phone], Apr 01 2017.   2.  Coronary artery disease. Aortic Atherosclerosis (ICD10-I70.0).   3.   Cholelithiasis.     Electronically Signed   By: Lauralyn Primes M.D.   On: 06/01/2019 13:26    Suspect given age greater than 65, Velcro crackles at the base, previous description of probable UIP and nearly negative serology [psoriatic arthritis does not count is connective tissue disease ILD], vocal cord paralysis with some aspiration risk that this is IPF. I am worried that his worsening cough reflects progression although this could just be worsening acid reflux.  I think he would benefit from high-resolution CT scan and pulmonary function test for restaging but also get a GI referral.  He wants to stay within the [facility] family for his GI services    .OV 12/18/2020  Subjective:  Patient ID: Alan Roman, male , DOB: 06/09/1946 , age 59 y.o. , MRN: [id number] , ADDRESS: [address] [place] [address] PCP Ignatius Specking, MD Patient Care Team: Ignatius Specking, MD as PCP - General (Internal Medicine) Yates Decamp, MD as PCP - Cardiology (Cardiology)  This Provider for this visit: Treatment Team:  Attending Provider: Kalman Shan, MD    12/18/2020 -   Chief Complaint  Patient presents with   Follow-up    PFT performed today, HRCT performed 4/25, and EGD scheduled 5/23.  Pt states he has been okay since last visit. States his only concern is hoarseness with voice and an occ cough.     HPI Alan Roman 75 y.o. -returns for follow-up to discuss his test results.  He continues to have hoarse voice.  He denies any dysphagia.  He is now scheduled for endoscopy 12/21/2020.  He is going to see cardiology ahead of that.  His serologies negative.  His symptoms continue to be minimal and stable.  He had high-resolution CT scan of the chest that shows progression compared to October 2020.  It is described as probable UIP.  Back in October 2020 there was craniocaudal gradient described and no air trapping.  This time April 2022 radiologist feels there is  slight upper lobe predominance and there is air trapping.  This latter description raises the question of possible chronic hypersensitive pneumonitis even though the final conclusion was probable UIP.  I went over exposure history with him again.  No organic antigen exposure history identified.  Nevertheless disease appears progressive.    CT chest HIgh res 11/23/20   IMPRESSION: CLINICAL DATA:  75 year old male with history of bronchiectasis. Follow-up study.   EXAM: CT CHEST WITHOUT CONTRAST   TECHNIQUE: Multidetector CT imaging of the chest was  performed following the standard protocol without intravenous contrast. High resolution imaging of the lungs, as well as inspiratory and expiratory imaging, was performed.   COMPARISON:  Chest CT 05/31/2019.   FINDINGS: Cardiovascular: Heart size is normal. There is no significant pericardial fluid, thickening or pericardial calcification. There is aortic atherosclerosis, as well as atherosclerosis of the great vessels of the mediastinum and the coronary arteries, including calcified atherosclerotic plaque in the left main, left anterior descending and left circumflex coronary arteries.   Mediastinum/Nodes: No pathologically enlarged mediastinal or hilar lymph nodes. Please note that accurate exclusion of hilar adenopathy is limited on noncontrast CT scans. Esophagus is unremarkable in appearance. No axillary lymphadenopathy.   Lungs/Pleura: High-resolution images demonstrate widespread patchy regions of ground-glass attenuation, septal thickening, mild cylindrical bronchiectasis and peripheral bronchiolectasis. Findings have no discernible craniocaudal gradient, and there is significant involvement in the anterior aspects of the upper lobes of the lungs bilaterally. No frank honeycombing. Inspiratory and expiratory imaging demonstrates some mild air trapping indicative of mild small airways disease. Minimal progression compared to  the prior examination. No acute consolidative airspace disease. No pleural effusions. No suspicious appearing pulmonary nodules or masses are noted.   Upper Abdomen: Aortic atherosclerosis. 5 mm calcified gallstone in the neck of the gallbladder. Small calcified granuloma in the spleen incidentally noted.   Musculoskeletal: There are no aggressive appearing lytic or blastic lesions noted in the visualized portions of the skeleton 1. The appearance of the lungs is compatible with interstitial lung disease, with a spectrum of findings once again categorized as probable usual interstitial pneumonia (UIP) per current ATS guidelines. Minimal progression of disease compared to the prior examination. 2. Aortic atherosclerosis, in addition to left main and 2 vessel coronary artery disease. Assessment for potential risk factor modification, dietary therapy or pharmacologic therapy may be warranted, if clinically indicated. 3. Cholelithiasis.   Aortic Atherosclerosis (ICD10-I70.0).     Electronically Signed   By: Trudie Reed M.D.   On: 11/24/2020 08:04   xxxx Results for Alan Roman, Alan "JAGDISH" (MRN [id number]) as of 12/18/2020 16:47  Ref. Range 06/03/2019 10:42  Aspergillus Fumigatus Latest Ref Range: NEGATIVE  NEGATIVE  Pigeon Serum Latest Ref Range: NEGATIVE  NEGATIVE  Anti Nuclear Antibody (ANA) Latest Ref Range: NEGATIVE  POSITIVE (A)  ANA Pattern 1 Unknown Nuclear, Homogeneous (A)  ANA Titer 1 Latest Units: titer 1:40 (H)  Anti JO-1 Latest Ref Range: 0.0 - 0.9 AI <0.2  CENTROMERE AB SCREEN Latest Ref Range: <1.0 NEG AI <1.0 NEG  RA Latex Turbid. Latest Ref Range: <14 IU/mL 14 (H)  SSA (Ro) (ENA) Antibody, IgG Latest Ref Range: <1.0 NEG AI <1.0 NEG  SSB (La) (ENA) Antibody, IgG Latest Ref Range: <1.0 NEG AI <1.0 NEG  Scleroderma (Scl-70) (ENA) Antibody, IgG Latest Ref Range: <1.0 NEG AI <1.0 NEG    OV 01/21/2021  Subjective:  Patient ID: Alan Roman,  male , DOB: [birth year] , age 69 y.o. , MRN: [id number] , ADDRESS: [address] [place] [address] PCP Ignatius Specking, MD Patient Care Team: Ignatius Specking, MD as PCP - General (Internal Medicine) Yates Decamp, MD as PCP - Cardiology (Cardiology)  This Provider for this visit: Treatment Team:  Attending Provider: Kalman Shan, MD    01/21/2021 -   Chief Complaint  Patient presents with   Follow-up    Pt states he has been doing okay since last visit. States that he has not started on the OFEV medication yet.     HPI  Alan Roman 75 y.o. -presents for follow-up in this visit he was supposed to have started nintedanib by now.  However he is yet to start this.  There are  supply chain issues.  But he says it is approved.  Overall no change in symptoms.  Unclear if he is finished his GI work-up.  He continues to have some hoarse voice.  He has not seen ENT.  He is accepted my recommendation to have a referral.  He did asked me whether a few episode of chlorine exposure acutely 40 years ago could have caused his pulmonary fibrosis.  I told him while it was possible it is unlikely.  Unclear about single episode although possible as be seen with COVID     SYMPTOM SCALE - ILD 11/10/2020 and 12/18/2020   01/21/2021   O2 use ra ra  Shortness of Breath 0 -> 5 scale with 5 being worst (score 6 If unable to do)   At rest 0 0  Simple tasks - showers, clothes change, eating, shaving 0 0  Household (dishes, doing bed, laundry) 0 1  Shopping 0 0  Walking level at own pace 1 0  Walking up Stairs 1 1  Total (30-36) Dyspnea Score 2 2  How bad is your cough? 1 1  How bad is your fatigue x 0  How bad is nausea 0 0  How bad is vomiting?  0 00  How bad is diarrhea? 0   How bad is anxiety? x 0  How bad is depression x 0        Simple office walk 185 feet x  3 laps goal with forehead probe 11/10/2020   O2 used ra  Number laps completed 3  Comments about pace avg  Resting  Pulse Ox/HR 100% and 66/min  Final Pulse Ox/HR 100% and 103/min  Desaturated </= 88% no  Desaturated <= 3% points no  Got Tachycardic >/= 90/min yes  Symptoms at end of test none  Miscellaneous comments x     PFT  PFT Results Latest Ref Rng & Units 12/18/2020 07/19/2019  FVC-Pre L 1.98 2.32  FVC-Predicted Pre % 59 63  FVC-Post L 2.01 2.06  FVC-Predicted Post % 60 56  Pre FEV1/FVC % % 89 83  Post FEV1/FCV % % 92 93  FEV1-Pre L 1.76 1.94  FEV1-Predicted Pre % 69 69  FEV1-Post L 1.84 1.93  DLCO uncorrected ml/min/mmHg 14.24 13.59  DLCO UNC% % 50 43  DLCO corrected ml/min/mmHg 14.24 -  DLCO COR %Predicted % 50 -  DLVA Predicted % 104 90  TLC L 3.60 3.80  TLC % Predicted % 55 55  RV % Predicted % 34 60       has a past medical history of Acute bronchiolitis due to other infectious organisms (01/26/2013), Arthritis, Extrapulmonary TB (tuberculosis) (02/02/2013), High risk medication use (12/27/2016), History of hiatal hernia (2007; 2009), Hypertension, and Pleural effusion (01/26/2013).   reports that he has never smoked. He has never used smokeless tobacco.  Past Surgical History:  Procedure Laterality Date   CATARACT EXTRACTION W/ INTRAOCULAR LENS  IMPLANT, BILATERAL Bilateral    HERNIA REPAIR     HIATAL HERNIA REPAIR  2007; 2009   JOINT REPLACEMENT     TOTAL KNEE ARTHROPLASTY Left 11/03/2015   TOTAL KNEE ARTHROPLASTY Left 11/03/2015   Procedure: Left Total Knee Arthroplasty;  Surgeon: Valeria Batman, MD;  Location: [facility];  Service: Orthopedics;  Laterality: Left;   TOTAL KNEE ARTHROPLASTY Right 07/17/2018  Procedure: RIGHT TOTAL KNEE ARTHROPLASTY;  Surgeon: Valeria Batman, MD;  Location[facility] OR;  Service: Orthopedics;  Laterality: Right;   VIDEO BRONCHOSCOPY Bilateral 01/31/2013   Procedure: VIDEO BRONCHOSCOPY WITHOUT FLUORO;  Surgeon: Merwyn Katos, MD;  Location: [facility] ENDOSCOPY;  Service: Cardiopulmonary;  Laterality: Bilateral;    No Known Allergies  Immunization  History  Administered Date(s) Administered   Influenza, High Dose Seasonal PF 04/22/2019   PPD Test 04/06/2010   Tdap 05/30/2017    Family History  Problem Relation Age of Onset   Colon cancer Neg Hx    Stomach cancer Neg Hx    Esophageal cancer Neg Hx      Current Outpatient Medications:    clobetasol cream (TEMOVATE) 0.05 %, Apply 1 application topically 2 (two) times daily., Disp: 45 g, Rfl: 1   losartan (COZAAR) 25 MG tablet, Take 1 tablet (25 mg total) by mouth daily., Disp: 20 tablet, Rfl: 0   multivitamin-iron-minerals-folic acid (CENTRUM) chewable tablet, Chew 1 tablet by mouth every 3 (three) days., Disp: , Rfl:    omeprazole (PRILOSEC) 20 MG capsule, TAKE 1 CAPSULE(20 MG) BY MOUTH DAILY, Disp: 90 capsule, Rfl: 0   rosuvastatin (CRESTOR) 5 MG tablet, Take 1 tablet (5 mg total) by mouth daily., Disp: 90 tablet, Rfl: 3   STELARA 45 MG/0.5ML SOSY injection, Inject 1 mL into the skin every 3 (three) months., Disp: , Rfl:    Triamcinolone Acetonide (TRIAMCINOLONE 0.1 % CREAM : EUCERIN) CREA, Apply 1 application topically 2 (two) times daily., Disp: 1 each, Rfl: 0      Objective:   Vitals:   01/21/21 1333  BP: 112/60  Pulse: 63  SpO2: 100%  Weight: 158 lb (71.7 kg)  Height: 5\' 7"  (1.702 m)    Estimated body mass index is 24.75 kg/m as calculated from the following:   Height as of this encounter: 5\' 7"  (1.702 m).   Weight as of this encounter: 158 lb (71.7 kg).  @WEIGHTCHANGE @    01/21/21 1333  Weight: 158 lb (71.7 kg)     Physical Exam  General: No distress. Looks well Neuro: Alert and Oriented x 3. GCS 15. Speech normal Psych: Pleasant Resp:  Barrel Chest - no.  Wheeze - no, Crackles - YES, No overt respiratory distress CVS: Normal heart sounds. Murmurs - no Ext: Stigmata of Connective Tissue Disease - no HEENT: Normal upper airway. PEERL +. No post nasal drip        Assessment:       ICD-10-CM   1. IPF (idiopathic pulmonary  fibrosis) (HCC)  J84.112     2. At risk for aspiration  Z91.89     3. Vocal cord atrophy  J38.3     4. Immunosuppressed status (HCC)  D84.9     5. Psoriatic arthritis (HCC)  L40.50          Plan:     Patient Instructions     ICD-10-CM   1. IPF (idiopathic pulmonary fibrosis) (HCC)  J84.112   2. At risk for aspiration  Z91.89   3. Vocal cord atrophy  J38.3   4. Immunosuppressed status (HCC)  D84.9    The clinical diagnosis I am giving to you is idiopathic pulmonary fibrosis [IPF]  - There is a specific variety of pulmonary fibrosis. I am basing this diagnosis based on age greater than 101, male gender, negative serology blood test, progression with time and also probable UIP pattern as described on CT scan   -  There is a small possibility that you have another variety of pulmonary fibrosis called chronic hypersensitivity pneumonitis based on descriptions of air trapping by radiology.  Regardless it is progressive   -Differentiating between these 2 requires lung biopsy but I want to try to avoid this if possible   -There is lot of common overlap in treating IPF and progressive chronic hypersensitive pneumonitis   Still awaiting ofev supply  Plan  - Start OFEV per protocol [antifibrotic] - REFER ENT Dr Suszanne ConnersEOH for Vocal cord issues -Complete GI work-up if not done so far -I will review your scan in the multidisciplinary case conference and if needed based on the results of that conference at at some point in the future we can consider bronchoscopy with lavage to differentiate between hypersensitive pneumonitis and IPF [but will definitely try to avoid biopsy]  Follow-up - 4 weeks tele visit with APP to discuss how ofev is going - 10-12 weeks with Dr. Marchelle Gearingamaswamy 30 min visit  -ILD symptom score and simple walking desaturation test at follow-up    SIGNATURE    Dr. Kalman ShanMurali Merrit Waugh, M.D., F.C.C.P,  Pulmonary and Critical Care Medicine Staff Physician, [facility] Director - Interstitial Lung Disease  Program  Pulmonary Fibrosis [facility] [place], [place], [id number]  Pager: [phone], If no answer or between  15:00h - 7:00h: call [phone] Telephone: [phone]  2:06 PM 01/21/2021

## 2021-01-21 NOTE — Addendum Note (Signed)
Addended by: Wyvonne Lenz on: 01/21/2021 02:09 PM   Modules accepted: Orders

## 2021-01-21 NOTE — Telephone Encounter (Addendum)
Called [employer] regarding patient's Ofev - they stated they have attempted to reach patient multiple times to schedule shipment of temporary supply (including a call today). They left a voicemail for him today.  I attempted to conference call with patient however phone went straight to VM.   Left VM for patient requesting he call [employer] at [phone].  I reached out to his home, but his wife picked up and requested I call back later. I called his daughter Alan Roman, and reviewed these steps with her.  She states that the [employer] application is in process and he has been trying to find a letter from [employer]. I advised her that the process can take several weeks and that [employer] company needs the denial letter or approval letter.  She is going home tomorrow on 01/22/21 and will call the company with him to schedule shipment  Chesley Mires, PharmD, MPH Clinical Pharmacist (Rheumatology and Pulmonology)

## 2021-01-28 NOTE — Telephone Encounter (Signed)
Called [employer] for update on Ofev shipment.  Patient received gratuity fill of Ofev from [facility] cares while LIS is still in process. Filled for 60 days and he received shipment yesterday, 01/27/21.  Chesley Mires, PharmD, MPH Clinical Pharmacist (Rheumatology and Pulmonology)

## 2021-02-05 ENCOUNTER — Encounter: Payer: Self-pay | Admitting: Cardiology

## 2021-02-05 ENCOUNTER — Other Ambulatory Visit: Payer: Self-pay

## 2021-02-05 ENCOUNTER — Ambulatory Visit: Payer: Medicare Other | Admitting: Cardiology

## 2021-02-05 VITALS — BP 153/83 | HR 64 | Temp 98.0°F | Resp 16 | Ht 67.0 in | Wt 156.0 lb

## 2021-02-05 DIAGNOSIS — E785 Hyperlipidemia, unspecified: Secondary | ICD-10-CM

## 2021-02-05 DIAGNOSIS — J849 Interstitial pulmonary disease, unspecified: Secondary | ICD-10-CM

## 2021-02-05 DIAGNOSIS — I10 Essential (primary) hypertension: Secondary | ICD-10-CM

## 2021-02-05 DIAGNOSIS — I2510 Atherosclerotic heart disease of native coronary artery without angina pectoris: Secondary | ICD-10-CM

## 2021-02-05 MED ORDER — ASPIRIN 81 MG PO CHEW: 81.0000 mg | CHEWABLE_TABLET | Freq: Every day | ORAL | Status: AC

## 2021-02-05 NOTE — Progress Notes (Signed)
Primary Physician/Referring:  Ignatius Specking, MD  Patient ID: Alan Roman, male    DOB: [birth year], 75 y.o.   MRN: [id number]  Chief Complaint  Patient presents with   Hypertension   Follow-up   HPI:    Alan Roman  is a 75 y.o. Asian [place] male with history of hypertension, psoriatic arthritis and follows Dr. Pollyann Savoy, very mild hyperlipidemia and coronary calcificaiton by CT chest, recent diagnosis of IPF in Dec 2020, hypertension. He has been started on Ofev since 01/11/2021. Having mild side effects with headache and mild abdominal discomfort.  Due to recent high-resolution CT scan done on 11/23/2020 revealing left main and two-vessel coronary calcification and aortic atherosclerosis, in view of underlying dyspnea, he underwent repeat echocardiogram and a stress test and presents for follow-up.  Past Medical History:  Diagnosis Date   Acute bronchiolitis due to other infectious organisms 01/26/2013   Arthritis    "knees" (11/03/2015)   Extrapulmonary TB (tuberculosis) 02/02/2013   High risk medication use 12/27/2016   History of hiatal hernia 2007; 2009   Hypertension    Pleural effusion 01/26/2013   Past Surgical History:  Procedure Laterality Date   CATARACT EXTRACTION W/ INTRAOCULAR LENS  IMPLANT, BILATERAL Bilateral    HERNIA REPAIR     HIATAL HERNIA REPAIR  2007; 2009   JOINT REPLACEMENT     TOTAL KNEE ARTHROPLASTY Left 11/03/2015   TOTAL KNEE ARTHROPLASTY Left 11/03/2015   Procedure: Left Total Knee Arthroplasty;  Surgeon: Valeria Batman, MD;  Location: [facility];  Service: Orthopedics;  Laterality: Left;   TOTAL KNEE ARTHROPLASTY Right 07/17/2018   Procedure: RIGHT TOTAL KNEE ARTHROPLASTY;  Surgeon: Valeria Batman, MD;  Location: [facility] OR;  Service: Orthopedics;  Laterality: Right;   VIDEO BRONCHOSCOPY Bilateral 01/31/2013   Procedure: VIDEO BRONCHOSCOPY WITHOUT FLUORO;  Surgeon: Merwyn Katos, MD;  Location: [facility];  Service: Cardiopulmonary;   Laterality: Bilateral;   Social History   Tobacco Use   Smoking status: Never   Smokeless tobacco: Never  Substance Use Topics   Alcohol use: No    ROS  Review of Systems  Cardiovascular:  Positive for dyspnea on exertion. Negative for leg swelling.  Respiratory:  Negative for cough.   Musculoskeletal:  Positive for joint pain.  Gastrointestinal:  Negative for melena.  Objective  Blood pressure (!) 153/83, pulse 64, temperature 98 F (36.7 C), temperature source Temporal, resp. rate 16, height 5\' 7"  (1.702 m), weight 156 lb (70.8 kg), SpO2 95 %.  Vitals with BMI 02/05/2021 01/21/2021 12/18/2020  Height 5\' 7"  5\' 7"  5\' 7"   Weight 156 lbs 158 lbs 155 lbs  BMI 24.43 24.74 24.27  Systolic 153 112 12/20/2020  Diastolic 83 60 58  Pulse 64 63 62     Physical Exam Constitutional:      Appearance: He is normal weight.  Neck:     Thyroid: No thyromegaly.     Vascular: No carotid bruit or JVD.  Cardiovascular:     Rate and Rhythm: Normal rate and regular rhythm.     Pulses: Normal pulses and intact distal pulses.     Heart sounds: Normal heart sounds. No murmur heard.   No gallop.  Pulmonary:     Effort: Pulmonary effort is normal.     Breath sounds: Rales (coarse bibasilar ronchi present) present.  Abdominal:     General: Bowel sounds are normal.     Palpations: Abdomen is soft.  Musculoskeletal:     Cervical back:  Neck supple.     Right lower leg: No edema.     Left lower leg: No edema.  Skin:    General: Skin is warm and dry.     Capillary Refill: Capillary refill takes less than 2 seconds.   Laboratory examination:   Recent Labs    02/27/20 1008 05/21/20 1028 12/29/20 1402  NA 137 141 139  K 4.2 4.2 4.1  CL 102 106 105  CO2 25 27 27   GLUCOSE 112* 83 127*  BUN 12 17 17   CREATININE 1.13 1.11 1.09  CALCIUM 9.1 8.6* 8.6  GFRNONAA >60 >60  --   GFRAA >60  --   --    CrCl cannot be calculated (Patient's most recent lab result is older than the maximum 21 days allowed.).   CMP Latest Ref Rng & Units 12/29/2020 05/21/2020 02/27/2020  Glucose 70 - 99 mg/dL 05/23/2020) 83 02/29/2020)  BUN 6 - 23 mg/dL 17 17 12   Creatinine 0.40 - 1.50 mg/dL 209(O 709(G  Sodium 135 - 145 mEq/L 139 141 137  Potassium 3.5 - 5.1 mEq/L 4.1 4.2 4.2  Chloride 96 - 112 mEq/L 105 106 102  CO2 19 - 32 mEq/L 27 27 25   Calcium 8.4 - 10.5 mg/dL 8.6 2.83) 9.1  Total Protein 6.0 - 8.3 g/dL 7.3 6.9 7.1  Total Bilirubin 0.2 - 1.2 mg/dL 0.5 1.0 1.1  Alkaline Phos 39 - 117 U/L 77 76 92  AST 0 - 37 U/L 27 35 36  ALT 0 - 53 U/L 18 25 19    CBC Latest Ref Rng & Units 12/29/2020 05/21/2020 02/27/2020  WBC 4.0 - 10.5 K/uL 5.6 5.1 6.0  Hemoglobin 13.0 - 17.0 g/dL 6.5(Y 12/31/2020  Hematocrit 39.0 - 52.0 % 38.9(L) 39.6 41.3  Platelets 150.0 - 400.0 K/uL 156.0 161 180    External labs:  Cholesterol, total 181.000 M 06/24/2019 HDL 46.000 M 06/24/2019 LDL 117 Triglycerides 92.000 M 06/24/2019  Hemoglobin 15.000 G/ 06/24/2019. Platelets 151.000 X 06/24/2019 Creatinine, Serum 1.060 MG/ 06/24/2019 Potassium 4.400 06/07/2019  ALT (SGPT) 18.000 IU/ 06/24/2019 TSH 1.980 06/24/2019  Medications and allergies  No Known Allergies   Current Outpatient Medications  Medication Instructions   aspirin (ASPIRIN CHILDRENS) 81 mg, Oral, Daily   clobetasol cream (TEMOVATE) 0.05 % 1 application, Topical, 2 times daily   losartan (COZAAR) 25 mg, Oral, Daily   multivitamin-iron-minerals-folic acid (CENTRUM) chewable tablet 1 tablet, Oral, Every 3 DAYS   Ofev 150 mg, Oral, 2 times daily   omeprazole (PRILOSEC) 20 MG capsule TAKE 1 CAPSULE(20 MG) BY MOUTH DAILY   rosuvastatin (CRESTOR) 5 mg, Oral, Daily   STELARA 45 MG/0.5ML SOSY injection 1 mL, Subcutaneous, Every 3 months   Triamcinolone Acetonide (TRIAMCINOLONE 0.1 % CREAM : EUCERIN) CREA 1 application, Topical, 2 times daily   Radiology:   Barium swallow study 06/07/2019: Schatzki ring at the GE junction, which prevents passage of a 13 mm barium tablet. No  evidence of hiatal hernia or gastroesophageal reflux.  High-resolution CT scan of the chest 11/23/2020:  1. The appearance of the lungs is compatible with interstitial lung disease, with a spectrum of findings once again categorized as probable usual interstitial pneumonia (UIP) per current ATS guidelines. Minimal progression of disease compared to the prior examination. 2. Aortic atherosclerosis, in addition to left main and 2 vessel coronary artery disease. Assessment for potential risk factor modification, dietary therapy or pharmacologic therapy may be warranted, if clinically indicated. 3. Cholelithiasis.  Cardiac Studies:  Lexiscan Tetrofosmin Stress Test  12/21/2020: Nondiagnostic ECG stress. Myocardial perfusion is normal. Overall LV systolic function is normal without regional wall motion abnormalities. Stress LVEF: 51%. No previous exam available for comparison.  Echocardiogram 12/22/2020: Left ventricle cavity is normal in size. Mild concentric hypertrophy of the left ventricle. Normal global wall motion. Normal LV systolic function with EF 63%. Doppler evidence of grade I (impaired) diastolic dysfunction, normal LAP.  Left atrial cavity is normal in size. Aneurysmal interatrial septum without 2D or color Doppler evidence of shunting. Trileaflet aortic valve.  Mild (Grade I) aortic regurgitation. Mild aortic valve leaflet calcification. Mild (Grade I) mitral regurgitation. Mild to moderate tricuspid regurgitation. Estimated pulmonary artery systolic pressure 30 mmHg.  Mild pulmonic regurgitation.  EKG:   EKG 09/25/2020: Normal sinus rhythm at rate of 69 bpm, left atrial enlargement, normal axis.  Low-voltage complexes.  No evidence of ischemia.  Probable pulmonary disease pattern.   Assessment     ICD-10-CM   1. Coronary artery calcification seen on CAT scan  I25.10 aspirin (ASPIRIN CHILDRENS) 81 MG chewable tablet    2. Essential hypertension  I10     3. ILD  (interstitial lung disease) (HCC)  J84.9     4. Mild hyperlipidemia  E78.5       EKG 09/06/2019: Normal sinus rhythm at rate of 62 bpm, left axis deviation, left intrafascicular block. IRBBB. No evidence of ischemia. No significant change from  Prior EKG.   Meds ordered this encounter  Medications   aspirin (ASPIRIN CHILDRENS) 81 MG chewable tablet    Sig: Chew 1 tablet (81 mg total) by mouth daily.    There are no discontinued medications.  No orders of the defined types were placed in this encounter.   Recommendations:   Alan Roman  is a 75 y.o. Asian [place] male with history of hypertension, psoriatic arthritis and follows Dr. Pollyann Savoy, very mild hyperlipidemia and coronary calcificaiton by CT chest, recent diagnosis of IPF in Dec 2020, hypertension. He has been started on Ofev since 01/11/2021. Having mild side effects with headache and mild abdominal discomfort.  Due to recent high-resolution CT scan done on 11/23/2020 revealing left main and two-vessel coronary calcification and aortic atherosclerosis, in view of underlying dyspnea, he underwent repeat echocardiogram and a stress test and presents for follow-up.  I reviewed the results of the echocardiogram and stress test, reassured him.  Although he has mild to moderate tricuspid regurgitation, there is no evidence of pulmonary hypertension.  I reviewed the CT scan report and the nuclear stress test with the patient.  He does need lipid control in view of left main disease and multivessel coronary calcification noted.  He is presently on 5 mg of Crestor, I do not have his recent lipids but advised the patient that I would prefer to have his LDL <100 but prefer it to be <70.  His blood pressure was elevated today, patient's blood pressure at home has been controlled, advised him to ensure that systolic blood pressure is <130 mmHg.  Patient instructed to start ASA  81mg  q daily for prophylaxis.  I will see him back on a  as needed basis.  I encouraged him to join IPF support group.    , MD, [facility] 02/05/2021, 4:07 PM Office: [phone] Pager: [phone]   CC: Dr. 01-31-2005

## 2021-02-11 ENCOUNTER — Telehealth: Payer: Self-pay | Admitting: Pharmacy Technician

## 2021-02-11 DIAGNOSIS — J84112 Idiopathic pulmonary fibrosis: Secondary | ICD-10-CM

## 2021-02-11 NOTE — Telephone Encounter (Signed)
Pt called in needing to speak with a nurse regarding medication . Please advise

## 2021-02-11 NOTE — Telephone Encounter (Addendum)
Patient left voicemail for pharmacist stating since he started Ofev, his blood pressure has been running higher than normal. This morning's reading was 144/85.  Requesting a return call.  Routing since pharmacist is on vacation until next week.

## 2021-02-11 NOTE — Telephone Encounter (Signed)
Attempted to call pt but line went straight to VM. Left message for him to return call. 

## 2021-02-16 NOTE — Telephone Encounter (Signed)
ATC left voicemail to return call to office  

## 2021-02-17 NOTE — Telephone Encounter (Signed)
Spoke with the pt  He states that he was unable to tolerate the ofev  He tried taking 1 capsule bid for approx 3 wks and states had diarrhea, HA, increased BP and fatigue  He states he stopped taking med on 02/12/21 and symptoms have resolved, BP this am 122/70  He has televisit with Beth planned for tomorrow and will keep this appt to discuss further  Will forward to MR as [facility]

## 2021-02-18 ENCOUNTER — Telehealth: Payer: Self-pay | Admitting: Primary Care

## 2021-02-18 ENCOUNTER — Other Ambulatory Visit: Payer: Self-pay

## 2021-02-18 ENCOUNTER — Ambulatory Visit (INDEPENDENT_AMBULATORY_CARE_PROVIDER_SITE_OTHER): Payer: Medicare Other | Admitting: Primary Care

## 2021-02-18 ENCOUNTER — Encounter: Payer: Self-pay | Admitting: Primary Care

## 2021-02-18 DIAGNOSIS — I10 Essential (primary) hypertension: Secondary | ICD-10-CM

## 2021-02-18 DIAGNOSIS — J84112 Idiopathic pulmonary fibrosis: Secondary | ICD-10-CM

## 2021-02-18 MED ORDER — LOSARTAN POTASSIUM 25 MG PO TABS: 25.0000 mg | ORAL_TABLET | Freq: Every day | ORAL | 1 refills | Status: DC

## 2021-02-18 NOTE — Progress Notes (Signed)
Virtual Visit via Telephone Note  I connected with Alan Roman [facility] on 02/18/21 at 10:30 AM EDT by telephone and verified that I am speaking with the correct person using two identifiers.  Location: Patient: Home Provider: Office   I discussed the limitations, risks, security and privacy concerns of performing an evaluation and management service by telephone and the availability of in person appointments. I also discussed with the patient that there may be a patient responsible charge related to this service. The patient expressed understanding and agreed to proceed.   History of Present Illness: 74 year old male, never smoked.  Past medical history significant for idiopathic pulmonary fibrosis.  Patient of Dr. Marchelle Gearing, last seen in office on 01/21/2021. Dx with IPF, started on OFEV in June 2022.   02/18/2021 Patient contacted today for 4 week follow-up. He started OFEV anti-fibrotic on June 30th. He developed headache and diarrhea after 2 days of starting medication. He stopped taking OFEV 4 days ago d/t GI symptoms and fatigue. He also noted that his blood pressure was elevated. His symptoms have resolved since stopping medication.    Observations/Objective:  -Able to speak in full sentences; no overt shortness of breath, wheezing or cough   Assessment and Plan:  IPF: - Started OFEV on 01/28/21, developed GI symptoms, HA and fatigue 2 days after starting medication - Plan 1-2 week medication holiday, then resume OFEV at 100mg  BID  - Advised he take Imodium prn diarrhea (Initial: 4 mg with first loose stool followed by 2 mg/dose after each subsequent loose stool; maximum daily dose: 8 mg/day) - Notify if unable to tolerate lower dose of medication   Follow Up Instructions:  - 6-8 weeks with Dr. [place] in office with simple walk test and ILD score    I discussed the assessment and treatment plan with the patient. The patient was provided an opportunity to ask questions and  all were answered. The patient agreed with the plan and demonstrated an understanding of the instructions.   The patient was advised to call back or seek an in-person evaluation if the symptoms worsen or if the condition fails to improve as anticipated.  I provided 22 minutes of non-face-to-face time during this encounter.   Marchelle Gearing, NP

## 2021-02-18 NOTE — Telephone Encounter (Signed)
Patient unable to tolerate 150mg  twice daily dosing of Ofev. Decision made at OV today with , NP, to retry at 100mg  twice daily. Unfortunately, he will be unable to receive additional gratuity shipment from [facility].    Reached out to patient's daughter, , who stated that there was some confusion and did not complete correct application. She is visiting her father this weekend and will complete application with him via online portal nd submit response to [facility].  She will notifiy pharmacy team at [facility] clinic once this is processed and complete so that we can follow up on PAP application   He has one unopened bottle of Ofev 150mg  capsules remaining at home and has been. She has been advised that [facility] has a donation policy in place and she can drop off unopened bottle to clinic and pharmacy team will transfer it to [facility] thereafter   She verbalized understanding.   [facility], PharmD, MPH, BCPS Clinical Pharmacist (Rheumatology and Pulmonology)

## 2021-02-18 NOTE — Telephone Encounter (Signed)
Patient is not approved for Ofev yet since he is in the process of applying for [employer] Security Extra Help. He has received 2 month supply of Ofev and company will not be able to provide any further until an update is received in regards to Extra Help eligibility.   ATC patient but phone went to VM. Left VM requesting return call.  Reached out to patient's daughter, Alan Roman, who stated that there was some confusion and did not complete correct application. She is visiting her father this weekend and will complete application with him via online portal nd submit response to [facility].  She will notifiy pharmacy team at [facility] clinic once this is processed and complete so that we can follow up on PAP application   He has one unopened bottle of Ofev 150mg  capsules remaining at home and has been. She has been advised that [facility] has a donation policy in place and she can drop off unopened bottle to clinic and pharmacy team will transfer it to [facility] thereafter  She has been advised the [facility] will not send another supply of medication to patient and he will be without therapy until there is an update with Extra Help and PAP application.  She verbalized understanding.  [facility], PharmD, MPH, BCPS Clinical Pharmacist (Rheumatology and Pulmonology)

## 2021-02-18 NOTE — Patient Instructions (Addendum)
Recommendations:  - Plan 1-2 week OFEV break, then resume OFEV at 100mg  twice daily  - Take Imodium as needed for diarrhea (Initial: 4 mg with first loose stool followed by 2 mg/dose after each subsequent loose stool; maximum daily dose: 8 mg/day) - Notify if unable to tolerate lower dose of medication   Orders: - Please come in for labs in 4 weeks (LFTs)  Follow-up: - 6-8 weeks with Dr. [place]

## 2021-02-18 NOTE — Telephone Encounter (Signed)
I am going to change his OFEV dose to 100mg  BID d/t diarrhea, HA and fatigue. Do you guys handle script or do you want me to send in to specialty pharmacy ([facility])

## 2021-02-18 NOTE — Telephone Encounter (Signed)
I attempted to call patient to set up f/u appt with Dr. Marchelle Gearing, there was no answer, left VM

## 2021-02-19 NOTE — Telephone Encounter (Signed)
Ok, can you keep me updated once the [employer] application is processed and/or if you need anything from me. He will be seeing MR in September I believe

## 2021-03-02 ENCOUNTER — Telehealth: Payer: Self-pay | Admitting: Cardiology

## 2021-03-02 NOTE — Telephone Encounter (Signed)
Patient is scheduled for endoscopy, he can be taken up for the upcoming procedure with low risk.  NoteI if necessary he can hold aspirin for 1 week and restart when appropriate   Yates Decamp, MD, [facility] 03/02/2021, 8:53 PM Office: [phone] Fax: [phone] Pager: [phone]

## 2021-03-03 ENCOUNTER — Other Ambulatory Visit: Payer: Self-pay | Admitting: Nurse Practitioner

## 2021-03-03 ENCOUNTER — Other Ambulatory Visit: Payer: Self-pay

## 2021-03-03 ENCOUNTER — Telehealth: Payer: Self-pay | Admitting: Nurse Practitioner

## 2021-03-03 DIAGNOSIS — K219 Gastro-esophageal reflux disease without esophagitis: Secondary | ICD-10-CM

## 2021-03-03 DIAGNOSIS — R1310 Dysphagia, unspecified: Secondary | ICD-10-CM

## 2021-03-03 NOTE — Telephone Encounter (Signed)
Lauren, pls contact the patient and schedule him for an EGD with Dr. Russella Dar at [facility]. Refer to office visit 12/02/2020. Pulmonary clearance was received 12/03/2020, see phone note from Dr. Marchelle Gearing and cardiac clearance received from Dr. Jacinto Halim see phone note 03/02/2021. Patient has history of vocal cord paralysis and  idiopathic pulmonary fibrosis) probable UIP pattern. Procedure needs to be schedule at [facility] as verified by Cathlyn Parsons CRNA.  Please let me know when scheduled. If Dr. Russella Dar does not have availability soon, may need to check with another physician who has an earlier date. Thank you.

## 2021-03-03 NOTE — Telephone Encounter (Signed)
Spoke with patient. Patient has been scheduled for pre visit 8/24 @11am  and EGD 9/1 @9 :45am with Dr. 11/1. Patient verbalized understanding, nothing more needed at time.

## 2021-03-04 NOTE — Telephone Encounter (Signed)
ATC patient for update on Social [employer] extra help application to process Ofev application. Left VM on home number requesting return call   Chesley Mires, PharmD, MPH, BCPS Clinical Pharmacist (Rheumatology and Pulmonology)

## 2021-03-11 NOTE — Telephone Encounter (Signed)
ATP- mailbox was full

## 2021-03-16 MED ORDER — OFEV 100 MG PO CAPS: 100.0000 mg | ORAL_CAPSULE | Freq: Two times a day (BID) | ORAL | 1 refills | Status: DC

## 2021-03-16 NOTE — Addendum Note (Signed)
Addended by: Murrell Redden on: 03/16/2021 04:11 PM   Modules accepted: Orders

## 2021-03-16 NOTE — Telephone Encounter (Signed)
Thanks

## 2021-03-16 NOTE — Telephone Encounter (Signed)
ATP- no answer, mailbox is full  Called daughter to check on status of Medicare extra help application, left voicemail.

## 2021-03-16 NOTE — Telephone Encounter (Signed)
Patient approved for Ofev through [facility] through 07/31/21.   Per lab OV with Buelah Manis, NP on 02/18/21, patient was unable to tolerate 150mg  twice daily. Plan was to change to 100mg  twice daily and assess tolerance. Rx sent to [facility] for 100mg  twice daily  Does not yet have f/u scheduled.  Will route to scheduling team to assist  Will route to as FYI on update  [facility], PharmD, MPH, BCPS Clinical Pharmacist (Rheumatology and Pulmonology)

## 2021-03-16 NOTE — Telephone Encounter (Signed)
Received notification from  [facility] regarding an approval for OFEV patient assistance from 03/16/21 to 07/31/21.   Program needs updated Rx reflecting that patient is now taking the 100mg  capsules. Rph will escribe new rx to [facility].  Phone number: [phone]

## 2021-03-16 NOTE — Telephone Encounter (Signed)
Can you ensure we gets follow-up

## 2021-03-17 IMAGING — RF DG ESOPHAGUS
9 series · 9 of 9 positions shown · non-contrast
Comparison: None.

CLINICAL DATA: Probable distal esophageal narrowing on recent
speech pathology swelling function tests. Evaluate for esophageal
stricture.

EXAM:
ESOPHOGRAM/BARIUM SWALLOW
TECHNIQUE: Single contrast examination was performed using thin barium.
FLUOROSCOPY TIME:  Fluoroscopy Time:  1 minutes 18 seconds
Radiation Exposure Index (if provided by the fluoroscopic device):
16.7 mGy
Number of Acquired Spot Images: 0

[Series 14: cp_standard · 0.19mm/px · 1 of 1 slices shown (1 of 9)]
[im 1/1]
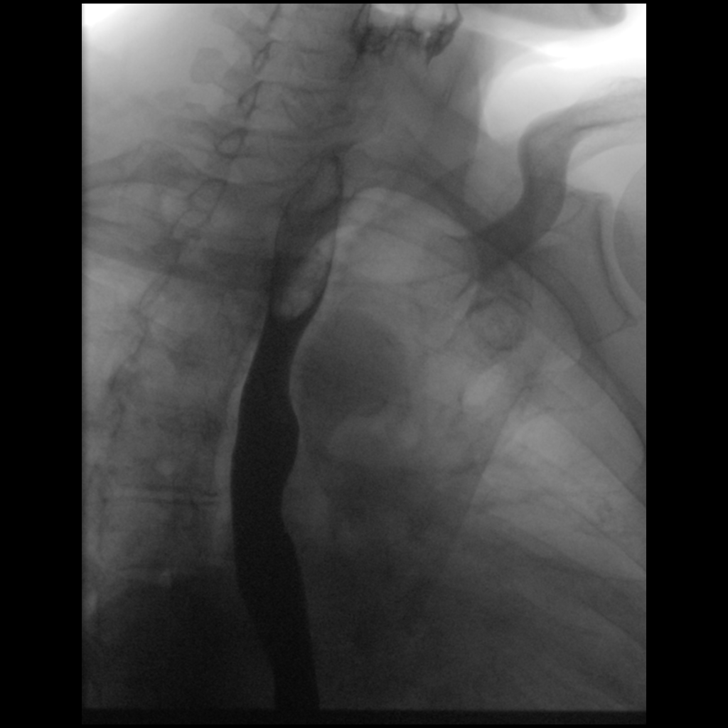

[Series 15: cp_standard · 0.19mm/px · 1 of 1 slices shown (2 of 9)]
[im 1/1]
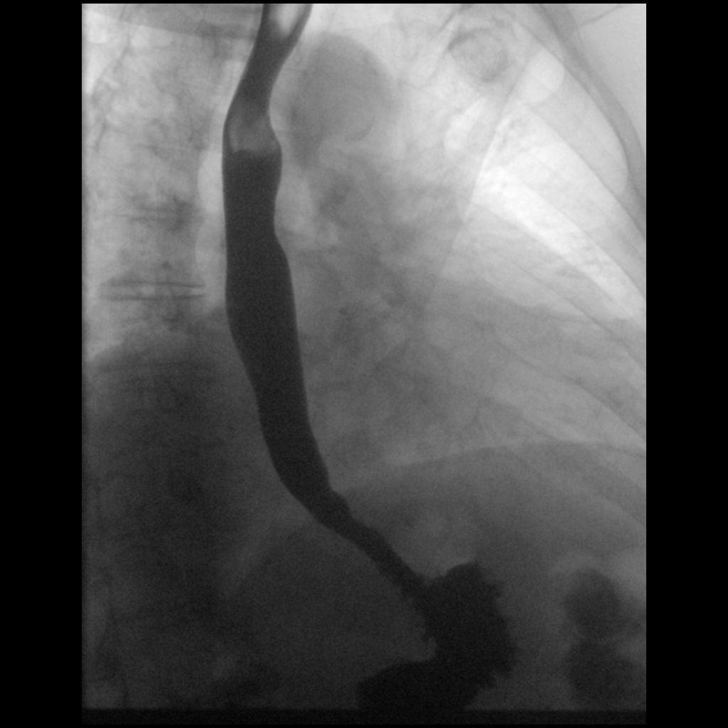

[Series 16: cp_standard · 0.19mm/px · 1 of 1 slices shown (3 of 9)]
[im 1/1]
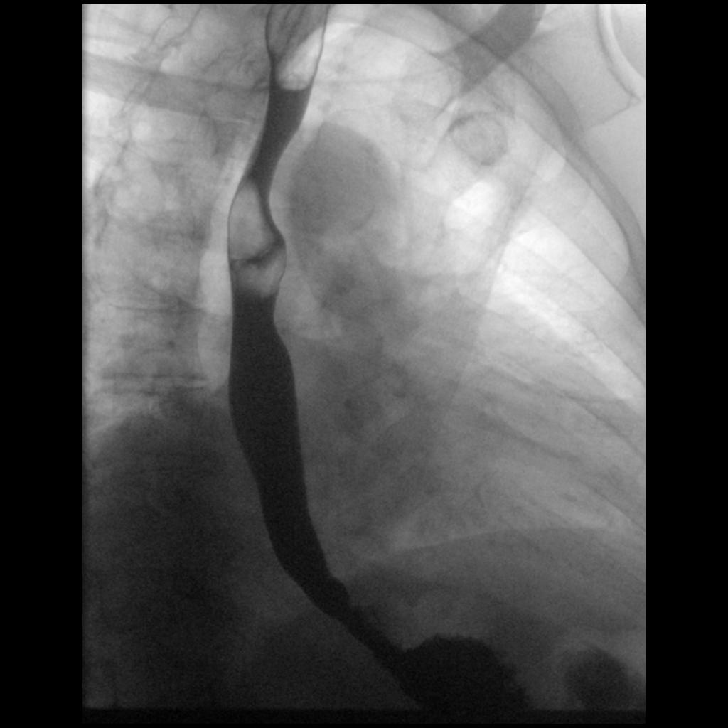

[Series 17: cp_standard · 0.22mm/px · 1 of 1 slices shown (4 of 9)]
[im 1/1]
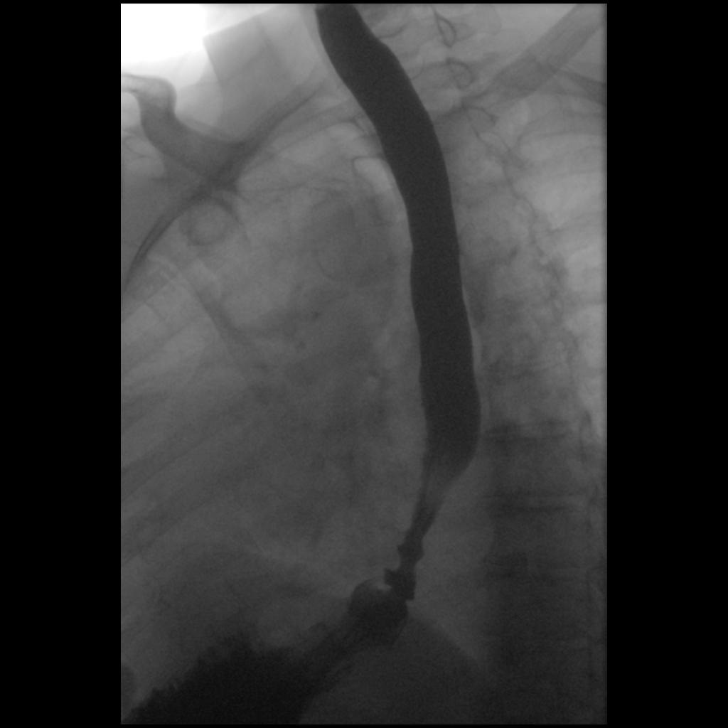

[Series 18: cp_standard · 0.22mm/px · 1 of 1 slices shown (5 of 9)]
[im 1/1]
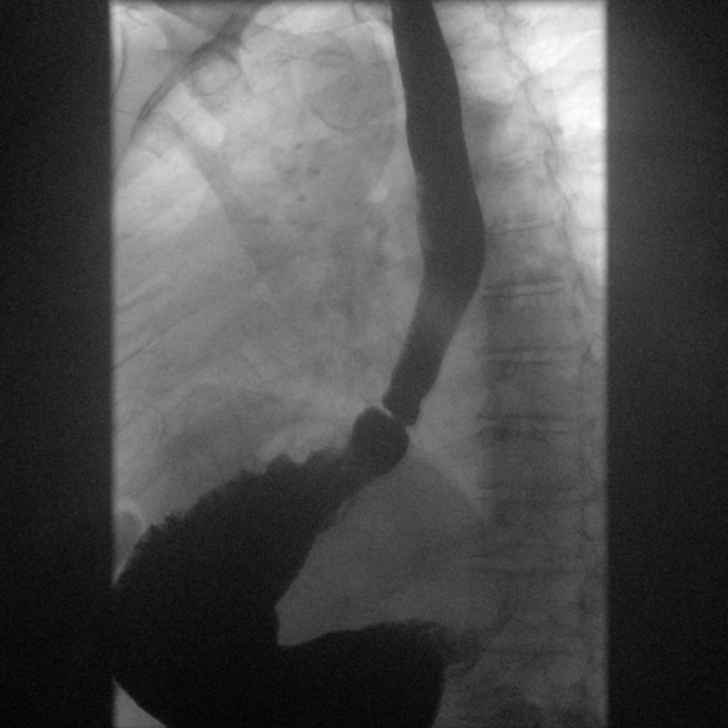

[Series 19: cp_standard · 0.22mm/px · 1 of 1 slices shown (6 of 9)]
[im 1/1]
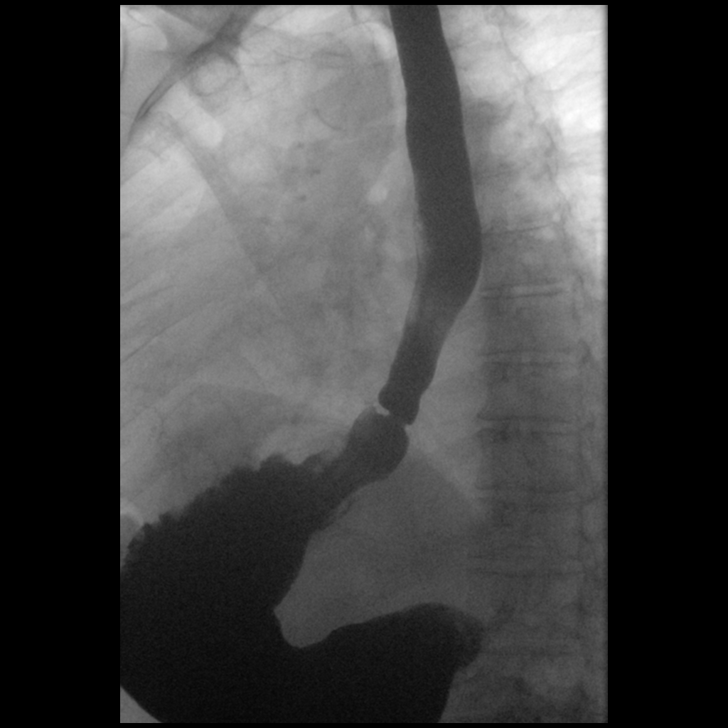

[Series 20: cp_standard · 0.22mm/px · 1 of 1 slices shown (7 of 9)]
[im 1/1]
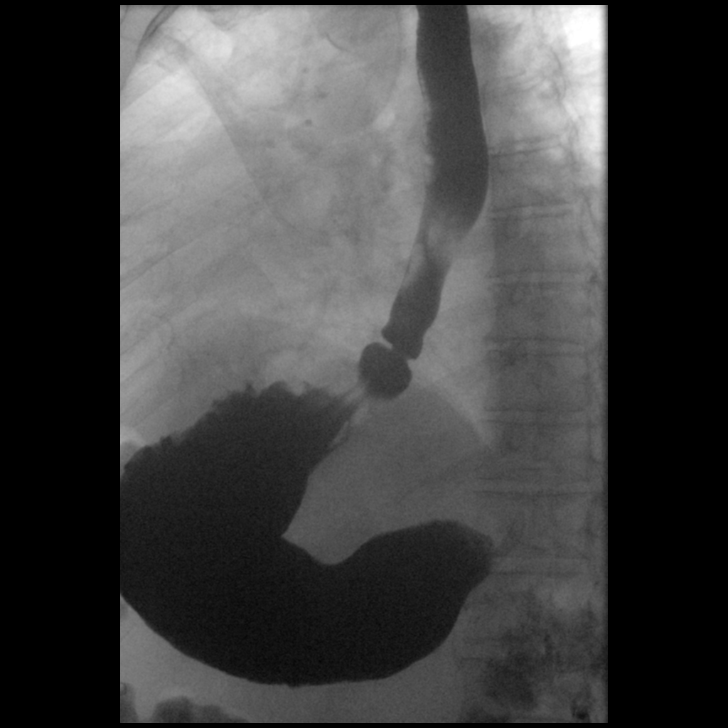

[Series 21: cp_standard · 0.22mm/px · 1 of 1 slices shown (8 of 9)]
[im 1/1]
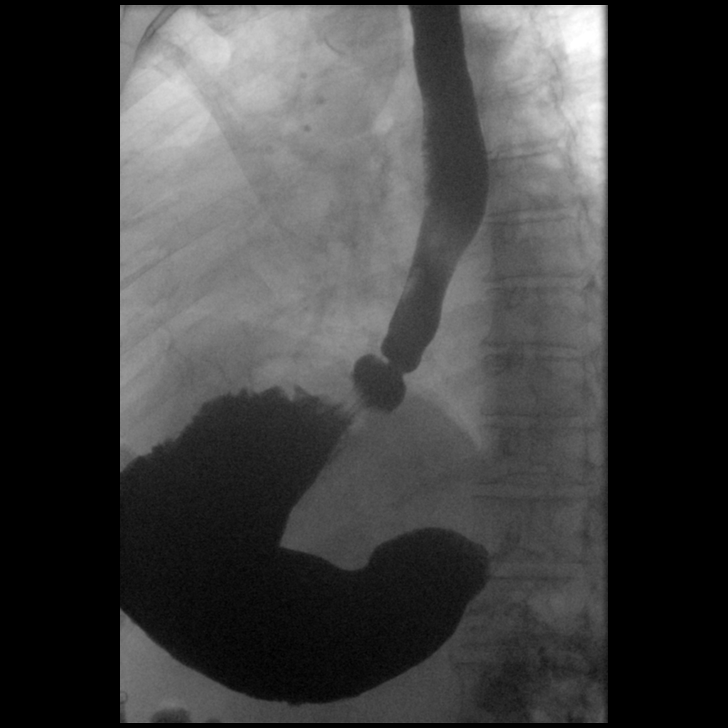

[Series 22: cp_standard · 0.18mm/px · 1 of 1 slices shown (9 of 9)]
[im 1/1]
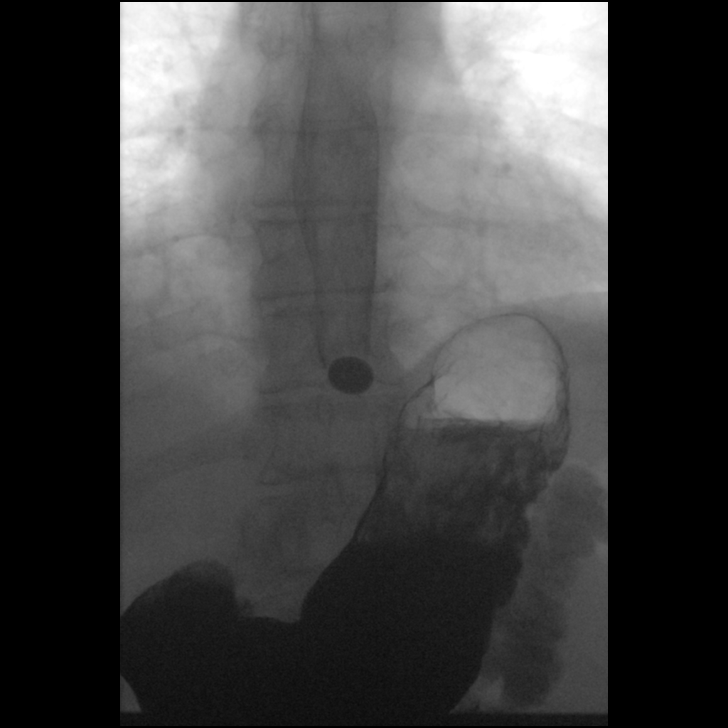

[9 of 9 positions shown; findings below may reference images not displayed]

FINDINGS: A Schatzki ring is seen at the GE junction, which prevents passage
of a 13 mm barium tablet. Esophagus is otherwise normal in
appearance. No evidence of hiatal hernia or gastroesophgeal reflux.
Esophageal motility is within normal limits.
IMPRESSION: Schatzki ring at the GE junction, which prevents passage of a 13 mm
barium tablet.

No evidence of hiatal hernia or gastroesophageal reflux.

## 2021-03-23 ENCOUNTER — Ambulatory Visit (AMBULATORY_SURGERY_CENTER): Payer: Self-pay

## 2021-03-23 ENCOUNTER — Other Ambulatory Visit: Payer: Self-pay

## 2021-03-23 VITALS — Ht 67.0 in | Wt 155.0 lb

## 2021-03-23 DIAGNOSIS — K219 Gastro-esophageal reflux disease without esophagitis: Secondary | ICD-10-CM

## 2021-03-23 DIAGNOSIS — R1310 Dysphagia, unspecified: Secondary | ICD-10-CM

## 2021-03-23 NOTE — Progress Notes (Signed)
No egg or soy allergy known to patient  No issues with past sedation with any surgeries or procedures Patient denies ever being told they had issues or difficulty with intubation  No FH of Malignant Hyperthermia No diet pills per patient No home 02 use per patient  No blood thinners per patient  Pt denies issues with constipation  No A fib or A flutter  EMMI video via MyChart  COVID 19 guidelines implemented in [place] today with Pt and RN  Pt is fully vaccinated for Covid x 2 + booster; Due to the COVID-19 pandemic we are asking patients to follow certain guidelines.  Pt aware of COVID protocols and [facility] guidelines   

## 2021-03-25 ENCOUNTER — Telehealth: Payer: Self-pay | Admitting: Primary Care

## 2021-03-25 ENCOUNTER — Other Ambulatory Visit: Payer: Self-pay

## 2021-03-25 ENCOUNTER — Encounter (HOSPITAL_COMMUNITY): Payer: Self-pay | Admitting: Gastroenterology

## 2021-03-25 NOTE — Telephone Encounter (Signed)
I called and spoke with daughter [name] and went over MR recs. Daughter verbalized understanding and will let patient know. Daughter knows to call for anymore questions/concerns. Nothing further needed.

## 2021-03-25 NOTE — Telephone Encounter (Signed)
Not seen BP rise with ofev. Having said that best way to sort out is  Plan  - stop ofev x next 5-7 days and monitor bp  - restart ofev at 100mg  once daily x 7 days and then 100mg  bid after that -> watch BP and GI side effect

## 2021-03-25 NOTE — Telephone Encounter (Signed)
I called and spoke with daughter [name], who is on [place], regarding message. [name] stated that since patient has restarted OFEV 100mg  twice daily, his BP has been going up for the last days. The highest it has gotten is 141/81 and that was today. He has been also complaining of nausea and upset stomach but no diarrhea yet. Daughter is more worried about BP rising and requesting recs from Dr. .  Dr. Marchelle Gearing, please advise.

## 2021-03-29 ENCOUNTER — Encounter: Payer: Self-pay | Admitting: Gastroenterology

## 2021-04-01 ENCOUNTER — Encounter (HOSPITAL_COMMUNITY): Payer: Self-pay | Admitting: Gastroenterology

## 2021-04-01 ENCOUNTER — Other Ambulatory Visit: Payer: Self-pay

## 2021-04-01 ENCOUNTER — Ambulatory Visit (HOSPITAL_COMMUNITY): Payer: Medicare Other | Admitting: Certified Registered Nurse Anesthetist

## 2021-04-01 ENCOUNTER — Ambulatory Visit (HOSPITAL_COMMUNITY)
Admission: RE | Admit: 2021-04-01 | Discharge: 2021-04-01 | Disposition: A | Discharge status: Home / Self Care | Payer: Medicare Other | Attending: Gastroenterology | Admitting: Gastroenterology

## 2021-04-01 ENCOUNTER — Encounter (HOSPITAL_COMMUNITY)
Admission: RE | Disposition: A | Discharge status: Home / Self Care | Payer: Self-pay | Source: Home / Self Care | Attending: Gastroenterology

## 2021-04-01 DIAGNOSIS — Z79899 Other long term (current) drug therapy: Secondary | ICD-10-CM | POA: Insufficient documentation

## 2021-04-01 DIAGNOSIS — R1310 Dysphagia, unspecified: Secondary | ICD-10-CM | POA: Diagnosis not present

## 2021-04-01 DIAGNOSIS — K222 Esophageal obstruction: Secondary | ICD-10-CM | POA: Diagnosis not present

## 2021-04-01 DIAGNOSIS — Z7982 Long term (current) use of aspirin: Secondary | ICD-10-CM | POA: Insufficient documentation

## 2021-04-01 DIAGNOSIS — Z96653 Presence of artificial knee joint, bilateral: Secondary | ICD-10-CM | POA: Diagnosis not present

## 2021-04-01 DIAGNOSIS — K219 Gastro-esophageal reflux disease without esophagitis: Secondary | ICD-10-CM | POA: Insufficient documentation

## 2021-04-01 HISTORY — PX: BIOPSY: SHX5522

## 2021-04-01 HISTORY — PX: SAVORY DILATION: SHX5439

## 2021-04-01 HISTORY — PX: ESOPHAGOGASTRODUODENOSCOPY (EGD) WITH PROPOFOL: SHX5813

## 2021-04-01 SURGERY — ESOPHAGOGASTRODUODENOSCOPY (EGD) WITH PROPOFOL
Anesthesia: Monitor Anesthesia Care

## 2021-04-01 MED ORDER — LIDOCAINE 2% (20 MG/ML) 5 ML SYRINGE
INTRAMUSCULAR | Status: DC | PRN
  Administered 2021-04-01: 60 mg via INTRAVENOUS

## 2021-04-01 MED ORDER — LACTATED RINGERS IV SOLN
INTRAVENOUS | Status: DC
  Administered 2021-04-01: 1000 mL via INTRAVENOUS

## 2021-04-01 MED ORDER — SODIUM CHLORIDE 0.9 % IV SOLN: INTRAVENOUS | Status: DC

## 2021-04-01 MED ORDER — PROPOFOL 500 MG/50ML IV EMUL
INTRAVENOUS | Status: DC | PRN
  Administered 2021-04-01: 150 ug/kg/min via INTRAVENOUS

## 2021-04-01 MED ORDER — PROPOFOL 10 MG/ML IV BOLUS
INTRAVENOUS | Status: DC | PRN
  Administered 2021-04-01: 40 mg via INTRAVENOUS

## 2021-04-01 SURGICAL SUPPLY — 15 items

## 2021-04-01 NOTE — Anesthesia Preprocedure Evaluation (Signed)
Anesthesia Evaluation  Patient identified by MRN, date of birth, ID band Patient awake    Reviewed: Allergy & Precautions, NPO status , Patient's Chart, lab work & pertinent test results  Airway Mallampati: II  TM Distance: >3 FB Neck ROM: Full    Dental no notable dental hx.    Pulmonary neg pulmonary ROS,  The appearance of the lungs is compatible with interstitial lung disease, with a spectrum of findings once again categorized as probable usual interstitial pneumonia (UIP) per current ATS guidelines. Minimal progression of disease compared to the prior examination.   Pulmonary exam normal breath sounds clear to auscultation       Cardiovascular hypertension, Normal cardiovascular exam Rhythm:Regular Rate:Normal     Neuro/Psych negative neurological ROS  negative psych ROS   GI/Hepatic Neg liver ROS, GERD  ,  Endo/Other  negative endocrine ROS  Renal/GU negative Renal ROS  negative genitourinary   Musculoskeletal negative musculoskeletal ROS (+)   Abdominal   Peds negative pediatric ROS (+)  Hematology negative hematology ROS (+)   Anesthesia Other Findings   Reproductive/Obstetrics negative OB ROS                             Anesthesia Physical Anesthesia Plan  ASA: 3  Anesthesia Plan: MAC   Post-op Pain Management:    Induction: Intravenous  PONV Risk Score and Plan: 1 and Propofol infusion and Treatment may vary due to age or medical condition  Airway Management Planned: Simple Face Mask  Additional Equipment:   Intra-op Plan:   Post-operative Plan:   Informed Consent: I have reviewed the patients History and Physical, chart, labs and discussed the procedure including the risks, benefits and alternatives for the proposed anesthesia with the patient or authorized representative who has indicated his/her understanding and acceptance.     Dental advisory  given  Plan Discussed with: CRNA and Surgeon  Anesthesia Plan Comments:         Anesthesia Quick Evaluation

## 2021-04-01 NOTE — H&P (Signed)
History & Physical  Primary Care Physician:  Ignatius Specking, MD Primary Gastroenterologist: Judie Petit. Russella Dar, MD  CHIEF COMPLAINT: Dysphagia, esophageal stricture, GERD  HPI: Alan Roman is a 75 y.o. male who presents for EGD with possible dilation, possible biopsy for GERD, possible LPR, dysphagia mainly with large pills and a distal esophageal stricture on barium esophagram.   Past Medical History:  Diagnosis Date   Acute bronchiolitis due to other infectious organisms 01/26/2013   Arthritis    "knees" (11/03/2015)   Extrapulmonary TB (tuberculosis) 02/02/2013   antibx therapy in 2014   GERD (gastroesophageal reflux disease)    on meds   High risk medication use 12/27/2016   History of hiatal hernia 2007; 2009   Hyperlipidemia    on meds   Hypertension    on meds   Pleural effusion 01/26/2013    Past Surgical History:  Procedure Laterality Date   CATARACT EXTRACTION W/ INTRAOCULAR LENS  IMPLANT, BILATERAL Bilateral    HIATAL HERNIA REPAIR  2007; 2009   TOTAL KNEE ARTHROPLASTY Left 11/03/2015   TOTAL KNEE ARTHROPLASTY Left 11/03/2015   Procedure: Left Total Knee Arthroplasty;  Surgeon: Valeria Batman, MD;  Location: [facility] OR;  Service: Orthopedics;  Laterality: Left;   TOTAL KNEE ARTHROPLASTY Right 07/17/2018   Procedure: RIGHT TOTAL KNEE ARTHROPLASTY;  Surgeon: Valeria Batman, MD;  Location: [facility] OR;  Service: Orthopedics;  Laterality: Right;   VIDEO BRONCHOSCOPY Bilateral 01/31/2013   Procedure: VIDEO BRONCHOSCOPY WITHOUT FLUORO;  Surgeon: Merwyn Katos, MD;  Location: [facility] ENDOSCOPY;  Service: Cardiopulmonary;  Laterality: Bilateral;    Prior to Admission medications   Medication Sig Start Date End Date Taking? Authorizing Provider  aspirin (ASPIRIN CHILDRENS) 81 MG chewable tablet Chew 1 tablet (81 mg total) by mouth daily. 02/05/21  Yes Yates Decamp, MD  clobetasol cream (TEMOVATE) 0.05 % Apply 1 application topically 2 (two) times daily. Patient taking  differently: Apply 1 application topically daily as needed (dry amd itching skin). 02/12/20  Yes Gearldine Bienenstock, PA-C  famotidine (PEPCID) 20 MG tablet Take 20 mg by mouth at bedtime. 02/26/21  Yes [provider]  losartan (COZAAR) 25 MG tablet Take 1 tablet (25 mg total) by mouth daily. 02/18/21 02/13/22 Yes Yates Decamp, MD  multivitamin-iron-minerals-folic acid (CENTRUM) chewable tablet Chew 1 tablet by mouth daily.   Yes [provider]  Nintedanib (OFEV) 100 MG CAPS Take 1 capsule (100 mg total) by mouth 2 (two) times daily. 03/16/21  Yes Glenford Bayley, NP  omeprazole (PRILOSEC) 20 MG capsule TAKE 1 CAPSULE(20 MG) BY MOUTH DAILY 03/03/21  Yes Arnaldo Natal, NP  rosuvastatin (CRESTOR) 5 MG tablet Take 1 tablet (5 mg total) by mouth daily. 09/25/20 09/20/21 Yes Yates Decamp, MD  STELARA 45 MG/0.5ML SOSY injection Inject 45 mg into the skin every 3 (three) months. 08/18/20  Yes [provider]  Triamcinolone Acetonide (TRIAMCINOLONE 0.1 % CREAM : EUCERIN) CREA Apply 1 application topically 2 (two) times daily. 04/29/20  Yes Deveshwar, Janalyn Rouse, MD    Current Facility-Administered Medications  Medication Dose Route Frequency Provider Last Rate Last Admin   0.9 %  sodium chloride infusion   Intravenous Continuous Meryl Dare, MD       lactated ringers infusion   Intravenous Continuous Meryl Dare, MD 10 mL/hr at 04/01/21 0846 1,000 mL at 04/01/21 0846    Allergies as of 03/03/2021   (No Known Allergies)    Family History  Problem Relation Age of Onset  Colon cancer Neg Hx    Stomach cancer Neg Hx    Esophageal cancer Neg Hx    Colon polyps Neg Hx    Rectal cancer Neg Hx     Social History   Socioeconomic History   Marital status: Married    Spouse name: Not on file   Number of children: 2   Years of education: Not on file   Highest education level: Not on file  Occupational History   Not on file  Tobacco Use   Smoking status: Never    Smokeless tobacco: Never  Vaping Use   Vaping Use: Never used  Substance and Sexual Activity   Alcohol use: No   Drug use: No   Sexual activity: Not on file  Other Topics Concern   Not on file  Social History Narrative   Not on file   Social Determinants of Health   Financial Resource Strain: Not on file  Food Insecurity: Not on file  Transportation Needs: Not on file  Physical Activity: Not on file  Stress: Not on file  Social Connections: Not on file  Intimate Partner Violence: Not on file    Review of Systems:  All systems reviewed an negative except where noted in HPI.  Gen: Denies any fever, chills, sweats, anorexia, fatigue, weakness, malaise, weight loss, and sleep disorder CV: Denies chest pain, angina, palpitations, syncope, orthopnea, PND, peripheral edema, and claudication. Resp: Denies dyspnea at rest, dyspnea with exercise, cough, sputum, wheezing, coughing up blood, and pleurisy. GI: Denies vomiting blood, jaundice, and fecal incontinence.   Denies dysphagia or odynophagia. GU : Denies urinary burning, blood in urine, urinary frequency, urinary hesitancy, nocturnal urination, and urinary incontinence. MS: Denies joint pain, limitation of movement, and swelling, stiffness, low back pain, extremity pain. Denies muscle weakness, cramps, atrophy.  Derm: Denies rash, itching, dry skin, hives, moles, warts, or unhealing ulcers.  Psych: Denies depression, anxiety, memory loss, suicidal ideation, hallucinations, paranoia, and confusion. Heme: Denies bruising, bleeding, and enlarged lymph nodes. Neuro:  Denies any headaches, dizziness, paresthesias. Endo:  Denies any problems with DM, thyroid, adrenal function.   Physical Exam: Vital signs in last 24 hours: Temp:  [98.7 F (37.1 C)] 98.7 F (37.1 C) (09/01 0830) Pulse Rate:  [55] 55 (09/01 0830) Resp:  [10] 10 (09/01 0830) BP: (175)/(76) 175/76 (09/01 0830) SpO2:  [99 %] 99 % (09/01 0830) Weight:  [67.6 kg] 67.6  kg (09/01 0830)   General:  Alert, well-developed, in NAD Head:  Normocephalic and atraumatic. Eyes:  Sclera clear, no icterus.   Conjunctiva pink. Ears:  Normal auditory acuity. Mouth:  No deformity or lesions.  Neck:  Supple; no masses . Lungs:  Clear throughout to auscultation.   No wheezes, crackles, or rhonchi. No acute distress. Heart:  Regular rate and rhythm; no murmurs. Abdomen:  Soft, nondistended, nontender. No masses, hepatomegaly. No obvious masses.  Normal bowel .    Rectal:  Deferred   Msk:  Symmetrical without gross deformities.. Pulses:  Normal pulses noted. Extremities:  Without edema. Neurologic:  Alert and  oriented x4;  grossly normal neurologically. Skin:  Intact without significant lesions or rashes. Cervical Nodes:  No significant cervical adenopathy. Psych:  Alert and cooperative. Normal mood and affect.   Impression / Plan:   GERD, possible LPR, dysphagia mainly with large pills and a distal esophageal stricture on barium esophagram for EGD with possible dilation possible biopsy. 2.   Interstitial lung disease followed by pulmonary. 3.   RA  on Stelara.  This patient is appropriate for endoscopic procedures in the ambulatory setting.    Venita Lick. Russella Dar  04/01/2021, 9:19 AM

## 2021-04-01 NOTE — Discharge Instructions (Signed)
YOU HAD AN ENDOSCOPIC PROCEDURE TODAY: Refer to the procedure report and other information in the discharge instructions given to you for any specific questions about what was found during the examination. If this information does not answer your questions, please call [place] office at [phone] to clarify.   YOU SHOULD EXPECT: Some feelings of bloating in the abdomen. Passage of more gas than usual. Walking can help get rid of the air that was put into your GI tract during the procedure and reduce the bloating. If you had a lower endoscopy (such as a colonoscopy or flexible sigmoidoscopy) you may notice spotting of blood in your stool or on the toilet paper. Some abdominal soreness may be present for a day or two, also.  DIET: Your first meal following the procedure should be a light meal and then it is ok to progress to your normal diet. A half-sandwich or bowl of soup is an example of a good first meal. Heavy or fried foods are harder to digest and may make you feel nauseous or bloated. Drink plenty of fluids but you should avoid alcoholic beverages for 24 hours. If you had a esophageal dilation, please see attached instructions for diet.    ACTIVITY: Your care partner should take you home directly after the procedure. You should plan to take it easy, moving slowly for the rest of the day. You can resume normal activity the day after the procedure however YOU SHOULD NOT DRIVE, use power tools, machinery or perform tasks that involve climbing or major physical exertion for 24 hours (because of the sedation medicines used during the test).   SYMPTOMS TO REPORT IMMEDIATELY: A gastroenterologist can be reached at any hour. Please call [phone]  for any of the following symptoms:   Following upper endoscopy (EGD, EUS, ERCP, esophageal dilation) Vomiting of blood or coffee ground material  New, significant abdominal pain  New, significant chest pain or pain under the shoulder blades  Painful or  persistently difficult swallowing  New shortness of breath  Black, tarry-looking or red, bloody stools  FOLLOW UP:  If any biopsies were taken you will be contacted by phone or by letter within the next 1-3 weeks. Call [phone]  if you have not heard about the biopsies in 3 weeks.  Please also call with any specific questions about appointments or follow up tests.  

## 2021-04-01 NOTE — Anesthesia Postprocedure Evaluation (Signed)
Anesthesia Post Note  Patient: Alan Roman  Procedure(s) Performed: ESOPHAGOGASTRODUODENOSCOPY (EGD) WITH PROPOFOL BIOPSY SAVORY DILATION     Patient location during evaluation: PACU Anesthesia Type: MAC Level of consciousness: awake and alert Pain management: pain level controlled Vital Signs Assessment: post-procedure vital signs reviewed and stable Respiratory status: spontaneous breathing, nonlabored ventilation, respiratory function stable and patient connected to nasal cannula oxygen Cardiovascular status: stable and blood pressure returned to baseline Postop Assessment: no apparent nausea or vomiting Anesthetic complications: no   No notable events documented.  Last Vitals:  Vitals:   04/01/21 0950 04/01/21 1000  BP: (!) 100/57 (!) 108/57  Pulse: (!) 59 (!) 54  Resp: 20 (!) 24  Temp:    SpO2: 100% 99%    Last Pain:  Vitals:   04/01/21 1000  TempSrc:   PainSc: 0-No pain                 Quintina Hakeem S

## 2021-04-01 NOTE — Op Note (Signed)
[facility] Patient Name: Alan Roman Procedure Date: 04/01/2021 MRN: [id number] Attending MD: Meryl DareMalcolm T Darral Rishel , MD Date of Birth: 1946/03/05 CSN: [id number] Age: [age] Admit Type: Outpatient Procedure:                Upper GI endoscopy Indications:              Dysphagia, Gastroesophageal reflux disease Providers:                Venita LickMalcolm T. Russella DarStark, MD, Margaree MackintoshHayleigh Westmoreland, RN,                            Michele McalpineErik Holloway Technician Referring MD:             Kalman ShanMurali Ramaswamy, MD Medicines:                Monitored Anesthesia Care Complications:            No immediate complications. Estimated Blood Loss:     Estimated blood loss was minimal. Procedure:                Pre-Anesthesia Assessment:                           - Prior to the procedure, a History and Physical                            was performed, and patient medications and                            allergies were reviewed. The patient's tolerance of                            previous anesthesia was also reviewed. The risks                            and benefits of the procedure and the sedation                            options and risks were discussed with the patient.                            All questions were answered, and informed consent                            was obtained. Prior Anticoagulants: The patient has                            taken no previous anticoagulant or antiplatelet                            agents. ASA Grade Assessment: III - A patient with                            severe systemic disease. After reviewing the risks  and benefits, the patient was deemed in                            satisfactory condition to undergo the procedure.                           After obtaining informed consent, the endoscope was                            passed under direct vision. Throughout the                            procedure, the patient's blood  pressure, pulse, and                            oxygen saturations were monitored continuously. The                            [id number]-[id number] ([id number]) Olympus endoscope was introduced                            through the mouth, and advanced to the second part                            of duodenum. The upper GI endoscopy was                            accomplished without difficulty. The patient                            tolerated the procedure well. Scope In: Scope Out: Findings:      One benign-appearing, intrinsic moderate stenosis was found at the       gastroesophageal junction. This stenosis measured 1.3 cm (inner       diameter) x less than one cm (in length). The stenosis was traversed. A       guidewire was placed and the scope was withdrawn. Dilations were       performed with Savary dilators with mild resistance at 15 mm, 16 mm and       17 mm. No heme noted.      The exam of the esophagus was otherwise normal. Biopsies obtained in the       mid and distal esophagus. R/O EOE, esophagitis.      The entire examined stomach was normal.      The duodenal bulb and second portion of the duodenum were normal. Impression:               - Benign-appearing esophageal stenosis. Dilated.                           - Otherwise normal appearing esophagus. Biopsies                            obtained.                           -  Normal stomach.                           - Normal duodenal bulb and second portion of the                            duodenum. Moderate Sedation:      Not Applicable - Patient had care per Anesthesia. Recommendation:           - Patient has a contact number available for                            emergencies. The signs and symptoms of potential                            delayed complications were discussed with the                            patient. Return to normal activities tomorrow.                            Written discharge instructions were provided to  the                            patient.                           - Clear liquid diet for 2 hours, then advance as                            tolerated to soft diet today.                           - Resume prior diet tomorrow.                           - Follow antireflux measures.                           - Continue present medications.                           - Await pathology results. Procedure Code(s):        --- Professional ---                           [phone], Esophagogastroduodenoscopy, flexible,                            transoral; with insertion of guide wire followed by                            passage of dilator(s) through esophagus over guide                            wire Diagnosis Code(s):        --- Professional ---  K22.2, Esophageal obstruction                           R13.10, Dysphagia, unspecified                           K21.9, Gastro-esophageal reflux disease without                            esophagitis CPT copyright 2019 [employer]. All rights reserved. The codes documented in this report are preliminary and upon coder review may  be revised to meet current compliance requirements. Meryl Dare, MD 04/01/2021 9:49:40 AM This report has been signed electronically. Number of Addenda: 0

## 2021-04-01 NOTE — Transfer of Care (Signed)
Immediate Anesthesia Transfer of Care Note  Patient: Alan Roman  Procedure(s) Performed: ESOPHAGOGASTRODUODENOSCOPY (EGD) WITH PROPOFOL BIOPSY SAVORY DILATION  Patient Location: PACU and Endoscopy Unit  Anesthesia Type:MAC  Level of Consciousness: awake, alert  and oriented  Airway & Oxygen Therapy: Patient Spontanous Breathing and Patient connected to face mask  Post-op Assessment: Report given to RN and Post -op Vital signs reviewed and stable  Post vital signs: Reviewed and stable  Last Vitals:  Vitals Value Taken Time  BP    Temp    Pulse 64 04/01/21 0948  Resp 25 04/01/21 0948  SpO2 100 % 04/01/21 0948  Vitals shown include unvalidated device data.  Last Pain:  Vitals:   04/01/21 0830  TempSrc: Oral  PainSc: 0-No pain         Complications: No notable events documented.

## 2021-04-02 LAB — SURGICAL PATHOLOGY

## 2021-04-06 ENCOUNTER — Encounter (HOSPITAL_COMMUNITY): Payer: Self-pay | Admitting: Gastroenterology

## 2021-04-07 ENCOUNTER — Encounter: Payer: Self-pay | Admitting: Gastroenterology

## 2021-04-20 ENCOUNTER — Encounter: Payer: Self-pay | Admitting: Internal Medicine

## 2021-04-20 ENCOUNTER — Ambulatory Visit: Payer: Medicare Other | Admitting: Internal Medicine

## 2021-04-20 ENCOUNTER — Other Ambulatory Visit: Payer: Self-pay

## 2021-04-20 VITALS — BP 136/80 | HR 54 | Temp 98.1°F | Ht 67.0 in | Wt 156.8 lb

## 2021-04-20 DIAGNOSIS — I158 Other secondary hypertension: Secondary | ICD-10-CM

## 2021-04-20 DIAGNOSIS — J84112 Idiopathic pulmonary fibrosis: Secondary | ICD-10-CM | POA: Diagnosis not present

## 2021-04-20 DIAGNOSIS — Z5181 Encounter for therapeutic drug level monitoring: Secondary | ICD-10-CM

## 2021-04-20 DIAGNOSIS — Z23 Encounter for immunization: Secondary | ICD-10-CM

## 2021-04-20 LAB — HEPATIC FUNCTION PANEL
ALT: 17 U/L (ref 0–53)
AST: 27 U/L (ref 0–37)
Albumin: 3.8 g/dL (ref 3.5–5.2)
Alkaline Phosphatase: 76 U/L (ref 39–117)
Bilirubin, Direct: 0.1 mg/dL (ref 0.0–0.3)
Total Bilirubin: 0.6 mg/dL (ref 0.2–1.2)
Total Protein: 7.8 g/dL (ref 6.0–8.3)

## 2021-04-20 NOTE — Patient Instructions (Addendum)
ICD-10-CM   1. IPF (idiopathic pulmonary fibrosis) (HCC)  J84.112     2. Medication monitoring encounter  Z51.81     3. 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  -Clinically pulmonary fibrosis appears to be stable since last visit but overall over time it is progressive -Based on conference discussion September 2022 -reconfirming that the diagnosis is IPF variety -Appears nintedanib at 150 mg twice daily caused high blood pressure and diarrhea and fatigue  -Based on reported history blood pressure went up while on nintedanib and resolved during the 2 weeks when you are off the nintedanib -Currently taking nintedanib 100 mg once daily since 04/10/2021 with intent to increase the dose  -Self-reported history blood pressure is slightly up -Noted that you have continued interest in nintedanib -Glad you are going on daily walks for 30 minutes without stopping  Plan -Check liver function test today  - Increase nintedanib to 100 mg twice daily on 04/21/2021 -Monitor blood pressure with this   Flu vaccine need  -High-dose flu shot 04/20/2021   Follow-up -4 weeks 30-minute visit with Dr. Marchelle Gearing -to evaluate nintedanib tolerance especially at 100 mg twice daily -8 weeks to spirometry and DLCO -8 weeks 30-minute visit Dr. Marchelle Gearing -   -At all visits will do ILD symptom score and simple walking desaturation test

## 2021-04-20 NOTE — Progress Notes (Signed)
NOV 2020 with Dr Vassie Loll  75 year old never smoker of [place] origin initial consult 05/2019  for evaluation of hoarseness, dry cough and congestion. He is originally from [place], [place], [place]-immigrated to the [place] in 1971 for his bachelors to [place] and then settled in [place] in 1978 and got into [employer] business.  PMH - - psoriatic arthritis by Dr. Orene Desanctis, was originally on Humira and is now on Cosentyx. - latent TB that was untreated initially and in 12/2012 he presented with a mild alveolitis on CT scan and pleural effusion that required thoracentesis, bronchoscopy was negative for AFB but he was treated under the guidance of local health department and ID with multiple drug antituberculous treatment for 3 months.  His problem lists extrapulmonary TB but on my review all AFB cultures were negative.   -07/2018 chest x-ray shows eventration of right hemidiaphragm.   - vocal cord paralysis in 04/2015 and underwent ENT evaluation for persistent dysphonia and underwent gel injection of the vocal cords at [facility] without any relief  Chief Complaint  Patient presents with   Follow-up    1 month rov- review CT chest 10/31.  pt states his s/s are unchanged since last month, prod cough with clear mucus qam that subsides throughout the day.      Symptoms are persistent. Main issues are hoarseness, dry cough and congestion  We reviewed HRCT in detail today  CT angiogram 10/2015 -which shows mild reticular densities suggestive but not diagnostic of ILD.  HRCT 05/31/19 mild, bibasilar predominant pulmonary fibrosis in a pattern featuring subtle subpleural reticular interstitial opacity, mild traction bronchiectasis and occasional areas of Bronchiolectasis - ' probable UIP ' - slight worse   OV Alan Roman Jan 2021  75 year old male never smoker presenting to our office today after completing pulmonary function testing.  Patient was last seen in our office in October/2020  for initial consult with Dr. Vassie Loll.  At that office visit patient was scheduled for pulmonary function testing, lab work was completed to further evaluate patient's probable UIP on October/2020 high-resolution CT chest, patient was referred to ear nose and throat, swallow studies were also ordered on the patient.  Patient completed ENT referral in 07/02/2019 with Dr. Merril Abbe Recommendations for voice therapy, that he follow-up with gastroenterology regarding this Schatzki's ring.  Follow-up in April.  Proceed forward with pulmonary pulmonary function test.  Patient reports that he is received a telephone call to restart voice rehab but he has not done this yet as he wanted to review pulmonary function testing with our office first.  Patient swallow studies are listed below:  06/06/2019-swallow study-mild aspiration risk, no treatment recommended at this time 06/07/2019-barium swallow-Schatzki ring at the GE junction which prevents passage of a 13 mm barium tablet  Patient has a gastroenterologist Dr. Loreta Ave but he has not seen her in over a year.  Patient has no scheduled upcoming appointments.  Patient continues to follow-up with rheumatology, Dr. Corliss Skains.  Per chart review last office note was in July/2020.  Patient presenting today to review December/2020 pulmonary function test.  Those results are listed below:  07/19/2019-pulmonary function test-FVC 2.32 (63% predicted), postbronchodilator ratio 93, postbronchodilator FEV1 1.93 (69% predicted), DLCO 13.59 (43% predicted) >>>Severe restriction, moderately severe diffusion defect  Lab work from November/2020 are also listed below:  06/03/2019-ANA-1: 40, nuclear homogenous 06/03/2019-rheumatoid factor-14 06/03/2019-anti-Jo 1-less than 0.2 06/03/2019-aldolase-4.9 06/03/2019-centromere antibodies-negative 06/03/2019-antiscleroderma antibody-less than 1 - 06/03/2019-Sjogren's syndrome 8 and B-negative 06/03/2019-hypersensitivity  pneumonitis-negative 06/03/2019-sed rate 22  Mildly elevated ANA and rheumatoid factor.  Felt to be not clinically significant.  Rest of results are negative.  Patient continues to report that he is asymptomatic.  Patient tolerates walk in office without any oxygen desaturations.  He does continue to have occasional bouts of congestion which is believed to be related to the Cosentyx.  Family has messaged rheumatology regarding this.  Tests:   07/19/2019-pulmonary function test-FVC 2.32 (63% predicted), postbronchodilator ratio 93, postbronchodilator FEV1 1.93 (69% predicted), DLCO 13.59 (43% predicted) >>>Severe restriction, moderately severe diffusion defect  07/16/2019-SARS-CoV-2-not detected  06/03/2019-ANA-1: 40, nuclear homogenous 06/03/2019-rheumatoid factor-14 06/03/2019-anti-Jo 1-less than 0.2 06/03/2019-aldolase-4.9 06/03/2019-centromere antibodies-negative 06/03/2019-antiscleroderma antibody-less than 1 - 06/03/2019-Sjogren's syndrome 8 and B-negative 06/03/2019-hypersensitivity pneumonitis-negative 06/03/2019-sed rate 22  06/01/2019-CT chest high-res mild, bibasilar predominant pulmonary fibrosis in a pattern with featuring subtle subpleural reticular interstitial opacity and mild traction bronchiectasis occasional areas of bronchiolectasis, probable UIP  06/06/2019-swallow study-mild aspiration risk, no treatment recommended at this time  06/07/2019-barium swallow-Schatzki ring at the GE junction which prevents passage of a 13 mm barium tablet  FENO:  No results found for: NITRICOXIDE  MH: Psoriatic arthritis (Dr. Corliss Skains - prev on Humira, now on Cosentyx), latent TB that was untreated initially in 12/2012 with mild alveolitis on CT scan and pleural effusion that required thoracentesis, bronchoscopy was negative for AFB but he was treated under guidance with local health department infectious disease with multidrug antituberculosis treatment for 3 months, eventration of right  hemidiaphragm, vocal cord paralysis in September/2016 underwent ENT evaluation for persistent dysphonia and underwent gel injection of vocal cords at [facility] without any relief Smoker/ Smoking History: Never smoker Maintenance: None   He is originally from [place], [place]-immigrated to the [place] in 1971 for his bachelors to [place] and then settled in [place] in 1978 and got into [employer] business.    OV 11/10/2020  Subjective:  Patient ID: Alan Roman, male , DOB: 1946/03/22 , age 30 y.o. , MRN: [id number] , ADDRESS: [address] [place] [address] PCP Ignatius Specking, MD Patient Care Team: Ignatius Specking, MD as PCP - General (Internal Medicine)  This Provider for this visit: Treatment Team:  Attending Provider: Kalman Shan, MD    11/10/2020 -   Chief Complaint  Patient presents with   Follow-up    Increased coughing for past 3 months, sometimes productive with whitish sputum. Denies shortness of breath.      HPI Alan Roman 75 y.o. -asked by Dr. Jacinto Halim to establish at the pulmonary fibrosis center.  Therefore patient is seeing Dr. Marchelle Gearing.  Patient used to be previously followed by Dr. Vassie Loll.  Last seen by Dr. Vassie Loll in 2020.  Then seen by nurse practitioner Alan Roman in January 2021.   His daughtre Alan Roman later joined on the phone call.  Patient is known to have ILD probable UIP pattern.  Is on observation at this point.   [facility] Comprehensive ILD Questionnaire  Symptoms:  -   he reports insidious onset of dyspnea for the last 4 months.  Since it started it is the same.  Although his ILD is present even before that.  He says that he has a very mild cough however in the last 4 to 6 months it is worse.  Particularly when he talks for a long time.  He does not wake up in the middle of the night because of the cough he does bring up some white sputum that is very light white.  Cough does not affect  his voice.  He does clear his  throat.  He has never had hemoptysis.  He does not cough when he lies down.  No nausea vomiting or diarrhea.  No wheezing.  Cough does get worse with eating   SYMPTOM SCALE - ILD 11/10/2020   O2 use ra  Shortness of Breath 0 -> 5 scale with 5 being worst (score 6 If unable to do)  At rest 0  Simple tasks - showers, clothes change, eating, shaving 0  Household (dishes, doing bed, laundry) 0  Shopping 0  Walking level at own pace 1  Walking up Stairs 1  Total (30-36) Dyspnea Score 2  How bad is your cough? 1  How bad is your fatigue x  How bad is nausea 0  How bad is vomiting?  0  How bad is diarrhea? 0  How bad is anxiety? x  How bad is depression x       Past Medical History : He has psoriatic arthritis.  He is immunosuppressed and is on immunomodulators.  Currently on Simponi.  He does not have rheumatoid arthritis.  No heart failure.  No other collagen vascular disease of connective tissue disease.  No COPD.  No asthma.  No HIV.  No hiatal hernia.  No pulmonary hypertension.  No diabetes.  No thyroid disease.  No stroke no seizures.  No tuberculosis no kidney disease no pneumonia.  No blood clots no heart disease no pleurisy.   He is deemed to be an aspiration risk he has vocal cord paralysis.  He is to see Dr. Loreta Ave GI.  He wants to switch to  GI but because of the pandemic has not been able to establish.  ROS: He does not have any fatigue.  No arthralgia.  No  dysphagia.  No Raynaud's.  No dry eyes no fever no weight loss.  No nausea no vomiting.  No heartburn no snoring no rash no ulcers.   FAMILY HISTORY of LUNG DISEASE: No pulmonary fibrosis.  No COPD.  No asthma no sarcoid no cystic fibrosis no hypersensitive pneumonitis no autoimmune disease in the family.    EXPOSURE HISTORY: No tobacco.  No smoking cigars no smoking pipes.  No electronic cigarette use.  No vaping marijuana.  No cocaine use.  No intravenous drug use.   HOME and HOBBY DETAILS : He lives in a  76 year old family home.  He is lived there for 40 years.  No damp environment.  No mold or mildew.  No humidifier use no CPAP use no nebulizer use.  No steam iron use.  No Jacuzzi use.  No misting [place].  No pet birds or parakeets.  No feather pillows or blankets.  No mold in the [place] duct.  No music habits no gardening habits.  Never exposed to birds or chickens.  No water damage in the basement.  No straw meds.  No hot tub use.  No animals at work.    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  Organic antigen exposure history is negative.  Inorganic antigen exposure history is negative.   PULMONARY TOXICITY HISTORY (27 items):  Simponi for psoriatic arthritis     PFT  PFT Results Latest Ref Rng & Units 07/19/2019  FVC-Pre L 2.32  FVC-Predicted Pre % 63  FVC-Post L 2.06  FVC-Predicted Post % 56  Pre FEV1/FVC % % 83  Post FEV1/FCV % % 93  FEV1-Pre L 1.94  FEV1-Predicted  Pre % 69  FEV1-Post L 1.93  DLCO uncorrected ml/min/mmHg 13.59  DLCO UNC% % 43  DLVA Predicted % 90  TLC L 3.80  TLC % Predicted % 55  RV % Predicted % 60    HRCT Oct 2020   IMPRESSION: 1. There is mild, bibasilar predominant pulmonary fibrosis in a pattern featuring subtle subpleural reticular interstitial opacity, mild traction bronchiectasis and occasional areas of bronchiolectasis, for example in the lateral segment right middle lobe and right lung base (series 4, image 176). These findings are very slightly worsened in comparison to prior examination dated 11/10/2015. Findings are consistent with mild pulmonary fibrosis in a probable UIP pattern. Findings are categorized as probable UIP per consensus guidelines: Diagnosis of Idiopathic Pulmonary Fibrosis: An Official ATS/ERS/JRS/ALAT Clinical Practice Guideline. Am Rosezetta Schlatter Crit Care Med Vol 198, Iss 5, [phone], Apr 01 2017.   2.  Coronary artery disease. Aortic Atherosclerosis (ICD10-I70.0).   3.   Cholelithiasis.     Electronically Signed   By: Lauralyn Primes M.D.   On: 06/01/2019 13:26    Suspect given age greater than 65, Velcro crackles at the base, previous description of probable UIP and nearly negative serology [psoriatic arthritis does not count is connective tissue disease ILD], vocal cord paralysis with some aspiration risk that this is IPF. I am worried that his worsening cough reflects progression although this could just be worsening acid reflux.  I think he would benefit from high-resolution CT scan and pulmonary function test for restaging but also get a GI referral.  He wants to stay within the [facility] for his GI services    .OV 12/18/2020  Subjective:  Patient ID: Alan Roman, male , DOB: 01/23/1946 , age 16 y.o. , MRN: [id number] , ADDRESS: [address] [place] [address] PCP Ignatius Specking, MD Patient Care Team: Ignatius Specking, MD as PCP - General (Internal Medicine) Yates Decamp, MD as PCP - Cardiology (Cardiology)  This Provider for this visit: Treatment Team:  Attending Provider: Kalman Shan, MD    12/18/2020 -   Chief Complaint  Patient presents with   Follow-up    PFT performed today, HRCT performed 4/25, and EGD scheduled 5/23.  Pt states he has been okay since last visit. States his only concern is hoarseness with voice and an occ cough.     HPI Alan Roman 75 y.o. -returns for follow-up to discuss his test results.  He continues to have hoarse voice.  He denies any dysphagia.  He is now scheduled for endoscopy 12/21/2020.  He is going to see cardiology ahead of that.  His serologies negative.  His symptoms continue to be minimal and stable.  He had high-resolution CT scan of the chest that shows progression compared to October 2020.  It is described as probable UIP.  Back in October 2020 there was craniocaudal gradient described and no air trapping.  This time April 2022 radiologist feels there is  slight upper lobe predominance and there is air trapping.  This latter description raises the question of possible chronic hypersensitive pneumonitis even though the final conclusion was probable UIP.  I went over exposure history with him again.  No organic antigen exposure history identified.  Nevertheless disease appears progressive.    CT chest HIgh res 11/23/20   IMPRESSION: CLINICAL DATA:  75 year old male with history of bronchiectasis. Follow-up study.   EXAM: CT CHEST WITHOUT CONTRAST   TECHNIQUE: Multidetector CT imaging of the chest was performed  following the standard protocol without intravenous contrast. High resolution imaging of the lungs, as well as inspiratory and expiratory imaging, was performed.   COMPARISON:  Chest CT 05/31/2019.   FINDINGS: Cardiovascular: Heart size is normal. There is no significant pericardial fluid, thickening or pericardial calcification. There is aortic atherosclerosis, as well as atherosclerosis of the great vessels of the mediastinum and the coronary arteries, including calcified atherosclerotic plaque in the left main, left anterior descending and left circumflex coronary arteries.   Mediastinum/Nodes: No pathologically enlarged mediastinal or hilar lymph nodes. Please note that accurate exclusion of hilar adenopathy is limited on noncontrast CT scans. Esophagus is unremarkable in appearance. No axillary lymphadenopathy.   Lungs/Pleura: High-resolution images demonstrate widespread patchy regions of ground-glass attenuation, septal thickening, mild cylindrical bronchiectasis and peripheral bronchiolectasis. Findings have no discernible craniocaudal gradient, and there is significant involvement in the anterior aspects of the upper lobes of the lungs bilaterally. No frank honeycombing. Inspiratory and expiratory imaging demonstrates some mild air trapping indicative of mild small airways disease. Minimal progression compared to  the prior examination. No acute consolidative airspace disease. No pleural effusions. No suspicious appearing pulmonary nodules or masses are noted.   Upper Abdomen: Aortic atherosclerosis. 5 mm calcified gallstone in the neck of the gallbladder. Small calcified granuloma in the spleen incidentally noted.   Musculoskeletal: There are no aggressive appearing lytic or blastic lesions noted in the visualized portions of the skeleton 1. The appearance of the lungs is compatible with interstitial lung disease, with a spectrum of findings once again categorized as probable usual interstitial pneumonia (UIP) per current ATS guidelines. Minimal progression of disease compared to the prior examination. 2. Aortic atherosclerosis, in addition to left main and 2 vessel coronary artery disease. Assessment for potential risk factor modification, dietary therapy or pharmacologic therapy may be warranted, if clinically indicated. 3. Cholelithiasis.   Aortic Atherosclerosis (ICD10-I70.0).     Electronically Signed   By: Trudie Reed M.D.   On: 11/24/2020 08:04   xxxx Results for CANDICE, LUNNEY "JAGDISH" (MRN [id number]) as of 12/18/2020 16:47  Ref. Range 06/03/2019 10:42  Aspergillus Fumigatus Latest Ref Range: NEGATIVE  NEGATIVE  Pigeon Serum Latest Ref Range: NEGATIVE  NEGATIVE  Anti Nuclear Antibody (ANA) Latest Ref Range: NEGATIVE  POSITIVE (A)  ANA Pattern 1 Unknown Nuclear, Homogeneous (A)  ANA Titer 1 Latest Units: titer 1:40 (H)  Anti JO-1 Latest Ref Range: 0.0 - 0.9 AI <0.2  CENTROMERE AB SCREEN Latest Ref Range: <1.0 NEG AI <1.0 NEG  RA Latex Turbid. Latest Ref Range: <14 IU/mL 14 (H)  SSA (Ro) (ENA) Antibody, IgG Latest Ref Range: <1.0 NEG AI <1.0 NEG  SSB (La) (ENA) Antibody, IgG Latest Ref Range: <1.0 NEG AI <1.0 NEG  Scleroderma (Scl-70) (ENA) Antibody, IgG Latest Ref Range: <1.0 NEG AI <1.0 NEG    OV 01/21/2021  Subjective:  Patient ID: CLARANCE BOLLARD,  male , DOB: 12-16-1945 , age 41 y.o. , MRN: [id number] , ADDRESS: [address] [place] [address] PCP Ignatius Specking, MD Patient Care Team: Ignatius Specking, MD as PCP - General (Internal Medicine) Yates Decamp, MD as PCP - Cardiology (Cardiology)  This Provider for this visit: Treatment Team:  Attending Provider: Kalman Shan, MD    01/21/2021 -   Chief Complaint  Patient presents with   Follow-up    Pt states he has been doing okay since last visit. States that he has not started on the OFEV medication yet.     HPI Alan  [facility] 75 y.o. -presents for follow-up in this visit he was supposed to have started nintedanib by now.  However he is yet to start this.  There are  supply chain issues.  But he says it is approved.  Overall no change in symptoms.  Unclear if he is finished his GI work-up.  He continues to have some hoarse voice.  He has not seen ENT.  He is accepted my recommendation to have a referral.  He did asked me whether a few episode of chlorine exposure acutely 40 years ago could have caused his pulmonary fibrosis.  I told him while it was possible it is unlikely.  Unclear about single episode although possible as be seen with COVID   02/18/2021 Patient contacted today for 4 week follow-up. He started OFEV anti-fibrotic on June 30th. He developed headache and diarrhea after 2 days of starting medication. He stopped taking OFEV 4 days ago d/t GI symptoms and fatigue. He also noted that his blood pressure was elevated. His symptoms have resolved since stopping medication.    MDD confderence Sept 2022  Dr Ashley Murrain - November 23, 2020 - HRCT - compared to CTA 2017 and HRCT 2020. Recent CT  moderage patchy retic, and some GGO. TB is mild . Findings are distriubted evenly - no CCG.  No frank HC. clearly tehre is fibrotic ILD. Air trapping is ver minimal and in lower lobes and is physiologic and not clinicall signifcant. No AT in upper lobe or middle lobe/lingulare   Final  DX: Prob UIP and ther is mild progression since 2020 and defintiey since 2017.   Clinical dx:  IPF and Rx as such   OV 04/20/2021  Subjective:  Patient ID: Alan Roman, male , DOB: 14-Apr-1946 , age 3 y.o. , MRN: [id number] , ADDRESS: [address] [place] [address] PCP Ignatius Specking, MD Patient Care Team: Ignatius Specking, MD as PCP - General (Internal Medicine) Yates Decamp, MD as PCP - Cardiology (Cardiology)  This Provider for this visit: Treatment Team:  Attending Provider: Kalman Shan, MD    04/20/2021 -   Chief Complaint  Patient presents with   Follow-up    Pt states his BP is still increasing even after going to 1 pill of the OFEV a day.   age greater than 65, Velcro crackles at the base, previous description of probable UIP and nearly negative serology [psoriatic arthritis does not count is connective tissue disease ILD], vocal cord paralysis with some aspiration risk that this is IPF.   - Dx given June 2022 - Confirmd MDD conferne Sept 2022   - Ofev June 2022  HPI Alan Roman 75 y.o. -presents for follow-up.  I am meeting his wife for the first time.  He feels he is stable.  He is walking 30 minutes in the mornings and he can do this without stopping.  He says his dyspnea is not worse and his cough is not worse but objectively on the subjective symptom questionnaire there might be progression.  We did discuss his case in the ILD conference.  The diagnosis is IPF.  There is progression.  In terms of therapeutics we started him on nintedanib at 150 mg twice daily but he called saying that he was intolerant to it because of hypertension [anecdotally he is my first patient with hypertension in 6 years with nintedanib] but also diarrhea and fatigue.  He then stopped it and apparently his blood pressure normalized.  Then 04/10/2021 he started himself  on nintedanib 100 mg strength.  He is currently doing only 1 time daily.  He feels his blood pressure is  going up again to 140 systolic.  [At 150 twice a day it was at 150 systolic] and this is no normal.  He is not having any other symptoms.  We discussed the option of switching to pirfenidone and going through approval process again.  We discussed the side effects of skin issues, fatigue, nausea, anorexia and ongoing liver monitoring with significant overlap with nintedanib side effects.  At this point in time he is resolved to increase his nintedanib to 100 mg twice daily and if he is tolerating well and that go to 150 mg twice daily.  If this fails then he will go to pirfenidone.  End of November 2022 he is going to go to [place] till February 2022.  He will have a high-dose flu shot today.         SYMPTOM SCALE - ILD 11/10/2020 and 12/18/2020   01/21/2021  04/20/2021   O2 use ra ra ra  Shortness of Breath 0 -> 5 scale with 5 being worst (score 6 If unable to do)    At rest 0 0 0  Simple tasks - showers, clothes change, eating, shaving 0 0 0  Household (dishes, doing bed, laundry) 0 1 2  Shopping 0 0 0  Walking level at own pace 1 0 1  Walking up Stairs 1 1 1   Total (30-36) Dyspnea Score 2 2 4   How bad is your cough? 1 1 2.5  How bad is your fatigue x 0 0  How bad is nausea 0 0 0  How bad is vomiting?  0 00 0  How bad is diarrhea? 0  0  How bad is anxiety? x 0 0  How bad is depression x 0 0        Simple office walk 185 feet x  3 laps goal with forehead probe 11/10/2020   O2 used ra  Number laps completed 3  Comments about pace avg  Resting Pulse Ox/HR 100% and 66/min  Final Pulse Ox/HR 100% and 103/min  Desaturated </= 88% no  Desaturated <= 3% points no  Got Tachycardic >/= 90/min yes  Symptoms at end of test none  Miscellaneous comments x    PFT  PFT Results Latest Ref Rng & Units 12/18/2020 07/19/2019  FVC-Pre L 1.98 2.32  FVC-Predicted Pre % 59 63  FVC-Post L 2.01 2.06  FVC-Predicted Post % 60 56  Pre FEV1/FVC % % 89 83  Post FEV1/FCV % % 92 93  FEV1-Pre  L 1.76 1.94  FEV1-Predicted Pre % 69 69  FEV1-Post L 1.84 1.93  DLCO uncorrected ml/min/mmHg 14.24 13.59  DLCO UNC% % 50 43  DLCO corrected ml/min/mmHg 14.24 -  DLCO COR %Predicted % 50 -  DLVA Predicted % 104 90  TLC L 3.60 3.80  TLC % Predicted % 55 55  RV % Predicted % 34 60       has a past medical history of Acute bronchiolitis due to other infectious organisms (01/26/2013), Arthritis, Extrapulmonary TB (tuberculosis) (02/02/2013), GERD (gastroesophageal reflux disease), High risk medication use (12/27/2016), History of hiatal hernia (2007; 2009), Hyperlipidemia, Hypertension, and Pleural effusion (01/26/2013).   reports that he has never smoked. He has never used smokeless tobacco.  Past Surgical History:  Procedure Laterality Date   BIOPSY  04/01/2021   Procedure: BIOPSY;  Surgeon: Meryl Dare, MD;  Location: [facility];  Service: Endoscopy;;   CATARACT EXTRACTION W/ INTRAOCULAR LENS  IMPLANT, BILATERAL Bilateral    ESOPHAGOGASTRODUODENOSCOPY (EGD) WITH PROPOFOL N/A 04/01/2021   Procedure: ESOPHAGOGASTRODUODENOSCOPY (EGD) WITH PROPOFOL;  Surgeon: Meryl Dare, MD;  Location: [facility];  Service: Endoscopy;  Laterality: N/A;   HIATAL HERNIA REPAIR  2007; 2009   SAVORY DILATION N/A 04/01/2021   Procedure: SAVORY DILATION;  Surgeon: Meryl Dare, MD;  Location: [facility];  Service: Endoscopy;  Laterality: N/A;   TOTAL KNEE ARTHROPLASTY Left 11/03/2015   TOTAL KNEE ARTHROPLASTY Left 11/03/2015   Procedure: Left Total Knee Arthroplasty;  Surgeon: Valeria Batman, MD;  Location: [facility] OR;  Service: Orthopedics;  Laterality: Left;   TOTAL KNEE ARTHROPLASTY Right 07/17/2018   Procedure: RIGHT TOTAL KNEE ARTHROPLASTY;  Surgeon: Valeria Batman, MD;  Location: [facility] OR;  Service: Orthopedics;  Laterality: Right;   VIDEO BRONCHOSCOPY Bilateral 01/31/2013   Procedure: VIDEO BRONCHOSCOPY WITHOUT FLUORO;  Surgeon: Merwyn Katos, MD;  Location: [facility];  Service:  Cardiopulmonary;  Laterality: Bilateral;    No Known Allergies  Immunization History  Administered Date(s) Administered   Fluad Quad(high Dose 65+) 04/20/2021   Influenza, High Dose Seasonal PF 04/22/2019   PPD Test 04/06/2010   Tdap 05/30/2017    Family History  Problem Relation Age of Onset   Colon cancer Neg Hx    Stomach cancer Neg Hx    Esophageal cancer Neg Hx    Colon polyps Neg Hx    Rectal cancer Neg Hx      Current Outpatient Medications:    aspirin (ASPIRIN CHILDRENS) 81 MG chewable tablet, Chew 1 tablet (81 mg total) by mouth daily., Disp: , Rfl:    clobetasol cream (TEMOVATE) 0.05 %, Apply 1 application topically 2 (two) times daily. (Patient taking differently: Apply 1 application topically daily as needed (dry amd itching skin).), Disp: 45 g, Rfl: 1   famotidine (PEPCID) 20 MG tablet, Take 20 mg by mouth at bedtime., Disp: , Rfl:    losartan (COZAAR) 25 MG tablet, Take 1 tablet (25 mg total) by mouth daily., Disp: 90 tablet, Rfl: 1   multivitamin-iron-minerals-folic acid (CENTRUM) chewable tablet, Chew 1 tablet by mouth daily., Disp: , Rfl:    Nintedanib (OFEV) 100 MG CAPS, Take 1 capsule (100 mg total) by mouth 2 (two) times daily., Disp: 180 capsule, Rfl: 1   omeprazole (PRILOSEC) 20 MG capsule, TAKE 1 CAPSULE(20 MG) BY MOUTH DAILY, Disp: 90 capsule, Rfl: 1   rosuvastatin (CRESTOR) 5 MG tablet, Take 1 tablet (5 mg total) by mouth daily., Disp: 90 tablet, Rfl: 3   STELARA 45 MG/0.5ML SOSY injection, Inject 45 mg into the skin every 3 (three) months., Disp: , Rfl:    Triamcinolone Acetonide (TRIAMCINOLONE 0.1 % CREAM : EUCERIN) CREA, Apply 1 application topically 2 (two) times daily., Disp: 1 each, Rfl: 0      Objective:   Vitals:   04/20/21 1424  BP: 136/80  Pulse: (!) 54  Temp: 98.1 F (36.7 C)  TempSrc: Oral  SpO2: 97%  Weight: 156 lb 12.8 oz (71.1 kg)  Height: 5\' 7"  (1.702 m)    Estimated body mass index is 24.56 kg/m as calculated from the  following:   Height as of this encounter: 5\' 7"  (1.702 m).   Weight as of this encounter: 156 lb 12.8 oz (71.1 kg).  @WEIGHTCHANGE @    04/20/21 1424  Weight: 156 lb 12.8 oz (71.1 kg)     Physical Exam  General: No distress. Looks well Neuro: Alert and Oriented x 3. GCS 15. Speech normal Psych: Pleasant Resp:  Barrel Chest - no.  Wheeze - no, Crackles - yes velcro at bse, No overt respiratory distress CVS: Normal heart sounds. Murmurs - no Ext: Stigmata of Connective Tissue Disease - no HEENT: Normal upper airway. PEERL +. No post nasal drip        Assessment:       ICD-10-CM   1. IPF (idiopathic pulmonary fibrosis) (HCC)  J84.112     2. Medication monitoring encounter  Z51.81     3. 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 Flu vaccine need  Z23     5. Need for immunization against influenza  Z23 Flu Vaccine QUAD High Dose(Fluad)         Plan:     Patient Instructions     ICD-10-CM   1. IPF (idiopathic pulmonary fibrosis) (HCC)  J84.112     2. Medication monitoring encounter  Z51.81     3. 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  -Clinically pulmonary fibrosis appears to be stable since last visit but overall over time it is progressive -Based on conference discussion September 2022 -reconfirming that the diagnosis is IPF variety -Appears nintedanib at 150 mg twice daily caused high blood pressure and diarrhea and fatigue  -Based on reported history blood pressure went up while on nintedanib and resolved during the 2 weeks when you are off the nintedanib -Currently taking nintedanib 100 mg once daily since 04/10/2021 with intent to increase the dose  -Self-reported history blood pressure is slightly up -Noted that you have continued interest in nintedanib -Glad you are going on daily  walks for 30 minutes without stopping  Plan -Check liver function test today  - Increase nintedanib to 100 mg twice daily on 04/21/2021 -Monitor blood pressure with this   Flu vaccine need  -High-dose flu shot  Follow-up -4 weeks 30-minute visit with Dr. Marchelle Gearing -to evaluate nintedanib tolerance especially at 100 mg twice daily -8 weeks to spirometry and DLCO -8 weeks 30-minute visit Dr. Marchelle Gearing -   -At all visits will do ILD symptom score and simple walking desaturation test     SIGNATURE    Dr. Kalman Shan, M.D., F.C.C.P,  Pulmonary and Critical Care Medicine Staff Physician, [facility] Center Director - Interstitial Lung Disease  Program  Pulmonary Fibrosis [facility] at [facility] [place], [place], [address]  Pager: [phone], If no answer or between  15:00h - 7:00h: call [phone] Telephone: [phone]  2:56 PM 04/20/2021

## 2021-04-20 NOTE — Progress Notes (Signed)
You saw patient today, LFTs normal. I wrote mychart message to patient making him aware

## 2021-04-26 ENCOUNTER — Telehealth: Payer: Self-pay

## 2021-04-26 NOTE — Progress Notes (Signed)
Primary Physician/Referring:  Ignatius Specking, MD  Patient ID: Alan [facility], male    DOB: 05/02/1946, 75 y.o.   MRN: [id number]  Chief Complaint  Patient presents with   Chest Pain   Follow-up   HPI:    Alan Roman  is a 75 y.o. Asian [place] male with history of hypertension, psoriatic arthritis and follows Dr. Pollyann Savoy, very mild hyperlipidemia and coronary calcificaiton by CT chest, recent diagnosis of IPF in Dec 2020, hypertension. He has been started on Ofev since 01/11/2021. Having mild side effects with headache and mild abdominal discomfort.  Due to recent high-resolution CT scan done on 11/23/2020 revealing left main and two-vessel coronary calcification and aortic atherosclerosis, in view of underlying dyspnea, he underwent echocardiogram and stress test.  Echocardiogram revealed mild to moderate TR, normal LVEF and otherwise no significant abnormalities.  Stress test was overall low risk.  Patient was seen in our office 02/05/2021 by Dr. Jacinto Halim who advised as needed follow-up with our office.  Patient now presents for urgent visit with complaints of chest pain.  Patient states over the last 2 days he has had intermittent right-sided chest pain, mildly improved with use of Vick's vapor rub.  This pain is worse with palpation as well as moving his right arm.  He has also noticed over the last several weeks his blood pressure has been elevated at home.  Dyspnea on exertion remains stable.  Denies palpitations, syncope, near syncope, dizziness, orthopnea, leg swelling.  Notably patient has upcoming appointment with PCP in November.  Past Medical History:  Diagnosis Date   Acute bronchiolitis due to other infectious organisms 01/26/2013   Arthritis    "knees" (11/03/2015)   Extrapulmonary TB (tuberculosis) 02/02/2013   antibx therapy in 2014   GERD (gastroesophageal reflux disease)    on meds   High risk medication use 12/27/2016   History of hiatal hernia 2007;  2009   Hyperlipidemia    on meds   Hypertension    on meds   Pleural effusion 01/26/2013   Family History  Problem Relation Age of Onset   Colon cancer Neg Hx    Stomach cancer Neg Hx    Esophageal cancer Neg Hx    Colon polyps Neg Hx    Rectal cancer Neg Hx    Past Surgical History:  Procedure Laterality Date   BIOPSY  04/01/2021   Procedure: BIOPSY;  Surgeon: Meryl Dare, MD;  Location: [facility];  Service: Endoscopy;;   CATARACT EXTRACTION W/ INTRAOCULAR LENS  IMPLANT, BILATERAL Bilateral    ESOPHAGOGASTRODUODENOSCOPY (EGD) WITH PROPOFOL N/A 04/01/2021   Procedure: ESOPHAGOGASTRODUODENOSCOPY (EGD) WITH PROPOFOL;  Surgeon: Meryl Dare, MD;  Location: [facility];  Service: Endoscopy;  Laterality: N/A;   HIATAL HERNIA REPAIR  2007; 2009   SAVORY DILATION N/A 04/01/2021   Procedure: SAVORY DILATION;  Surgeon: Meryl Dare, MD;  Location: [facility];  Service: Endoscopy;  Laterality: N/A;   TOTAL KNEE ARTHROPLASTY Left 11/03/2015   TOTAL KNEE ARTHROPLASTY Left 11/03/2015   Procedure: Left Total Knee Arthroplasty;  Surgeon: Valeria Batman, MD;  Location: [facility] OR;  Service: Orthopedics;  Laterality: Left;   TOTAL KNEE ARTHROPLASTY Right 07/17/2018   Procedure: RIGHT TOTAL KNEE ARTHROPLASTY;  Surgeon: Valeria Batman, MD;  Location: [facility] OR;  Service: Orthopedics;  Laterality: Right;   VIDEO BRONCHOSCOPY Bilateral 01/31/2013   Procedure: VIDEO BRONCHOSCOPY WITHOUT FLUORO;  Surgeon: Merwyn Katos, MD;  Location: [facility] ENDOSCOPY;  Service: Cardiopulmonary;  Laterality: Bilateral;  Social History   Tobacco Use   Smoking status: Never   Smokeless tobacco: Never  Substance Use Topics   Alcohol use: No    ROS  Review of Systems  Constitutional: Negative for malaise/fatigue and weight gain.  Cardiovascular:  Positive for chest pain (right-sided) and dyspnea on exertion (stable). Negative for claudication, leg swelling, near-syncope, orthopnea, palpitations,  paroxysmal nocturnal dyspnea and syncope.  Respiratory:  Negative for cough and shortness of breath.   Musculoskeletal:  Positive for joint pain.  Gastrointestinal:  Negative for melena.  Neurological:  Negative for dizziness.   Objective  Blood pressure (!) 148/82, pulse (!) 50, temperature 98.2 F (36.8 C), height 5\' 7"  (1.702 m), weight 158 lb (71.7 kg), SpO2 96 %.  Vitals with BMI 04/27/2021 04/27/2021 04/20/2021  Height - 5\' 7"  5\' 7"   Weight - 158 lbs 156 lbs 13 oz  BMI - 24.74 24.55  Systolic 148 147 329  Diastolic 82 77 80  Pulse 50 51 54     Physical Exam Vitals reviewed.  Constitutional:      Appearance: He is normal weight.  HENT:     Head: Normocephalic and atraumatic.  Neck:     Thyroid: No thyromegaly.     Vascular: No carotid bruit or JVD.  Cardiovascular:     Rate and Rhythm: Normal rate and regular rhythm.     Pulses: Normal pulses and intact distal pulses.     Heart sounds: Normal heart sounds, S1 normal and S2 normal. No murmur heard.   No gallop.  Pulmonary:     Effort: Pulmonary effort is normal. No respiratory distress.     Breath sounds: Rales (coarse bibasilar ronchi present) present. No wheezing or rhonchi.  Chest:    Musculoskeletal:     Cervical back: Neck supple.     Right lower leg: No edema.     Left lower leg: No edema.  Skin:    General: Skin is warm and dry.  Neurological:     Mental Status: He is alert.   Laboratory examination:   Recent Labs    05/21/20 1028 12/29/20 1402  NA 141 139  K 4.2 4.1  CL 106 105  CO2 27 27  GLUCOSE 83 127*  BUN 17 17  CREATININE 1.11 1.09  CALCIUM 8.6* 8.6  GFRNONAA >60  --    CrCl cannot be calculated (Patient's most recent lab result is older than the maximum 21 days allowed.).  CMP Latest Ref Rng & Units 04/20/2021 12/29/2020 05/21/2020  Glucose 70 - 99 mg/dL - 924(Q) 83  BUN 6 - 23 mg/dL - 17 17  Creatinine 6.83 - 1.50 mg/dL - 4.19 6.22  Sodium 297 - 145 mEq/L - 139 141  Potassium 3.5 -  5.1 mEq/L - 4.1 4.2  Chloride 96 - 112 mEq/L - 105 106  CO2 19 - 32 mEq/L - 27 27  Calcium 8.4 - 10.5 mg/dL - 8.6 9.8(X)  Total Protein 6.0 - 8.3 g/dL 7.8 7.3 6.9  Total Bilirubin 0.2 - 1.2 mg/dL 0.6 0.5 1.0  Alkaline Phos 39 - 117 U/L 76 77 76  AST 0 - 37 U/L 27 27 35  ALT 0 - 53 U/L 17 18 25    CBC Latest Ref Rng & Units 12/29/2020 05/21/2020 02/27/2020  WBC 4.0 - 10.5 K/uL 5.6 5.1 6.0  Hemoglobin 13.0 - 17.0 g/dL 21.1 94.1 74.0  Hematocrit 39.0 - 52.0 % 38.9(L) 39.6 41.3  Platelets 150.0 - 400.0 K/uL 156.0 161 180  External labs:  Cholesterol, total 181.000 M 06/24/2019 HDL 46.000 M 06/24/2019 LDL 117 Triglycerides 92.000 M 06/24/2019  Hemoglobin 15.000 G/ 06/24/2019. Platelets 151.000 X 06/24/2019 Creatinine, Serum 1.060 MG/ 06/24/2019 Potassium 4.400 06/07/2019  ALT (SGPT) 18.000 IU/ 06/24/2019 TSH 1.980 06/24/2019  Allergies  No Known Allergies   Medications Prior to Visit:   Outpatient Medications Prior to Visit  Medication Sig Dispense Refill   aspirin (ASPIRIN CHILDRENS) 81 MG chewable tablet Chew 1 tablet (81 mg total) by mouth daily.     clobetasol cream (TEMOVATE) 0.05 % Apply 1 application topically 2 (two) times daily. (Patient taking differently: Apply 1 application topically daily as needed (dry amd itching skin).) 45 g 1   multivitamin-iron-minerals-folic acid (CENTRUM) chewable tablet Chew 1 tablet by mouth daily.     Nintedanib (OFEV) 100 MG CAPS Take 1 capsule (100 mg total) by mouth 2 (two) times daily. 180 capsule 1   omeprazole (PRILOSEC) 20 MG capsule TAKE 1 CAPSULE(20 MG) BY MOUTH DAILY 90 capsule 1   rosuvastatin (CRESTOR) 5 MG tablet Take 1 tablet (5 mg total) by mouth daily. 90 tablet 3   STELARA 45 MG/0.5ML SOSY injection Inject 45 mg into the skin every 3 (three) months.     Triamcinolone Acetonide (TRIAMCINOLONE 0.1 % CREAM : EUCERIN) CREA Apply 1 application topically 2 (two) times daily. 1 each 0   famotidine (PEPCID) 20 MG tablet Take 1  tablet by mouth daily.     losartan (COZAAR) 25 MG tablet Take 1 tablet (25 mg total) by mouth daily. 90 tablet 1   famotidine (PEPCID) 20 MG tablet Take 20 mg by mouth at bedtime.     No facility-administered medications prior to visit.   Final Medications at End of Visit    Current Meds  Medication Sig   aspirin (ASPIRIN CHILDRENS) 81 MG chewable tablet Chew 1 tablet (81 mg total) by mouth daily.   clobetasol cream (TEMOVATE) 0.05 % Apply 1 application topically 2 (two) times daily. (Patient taking differently: Apply 1 application topically daily as needed (dry amd itching skin).)   multivitamin-iron-minerals-folic acid (CENTRUM) chewable tablet Chew 1 tablet by mouth daily.   Nintedanib (OFEV) 100 MG CAPS Take 1 capsule (100 mg total) by mouth 2 (two) times daily.   omeprazole (PRILOSEC) 20 MG capsule TAKE 1 CAPSULE(20 MG) BY MOUTH DAILY   rosuvastatin (CRESTOR) 5 MG tablet Take 1 tablet (5 mg total) by mouth daily.   STELARA 45 MG/0.5ML SOSY injection Inject 45 mg into the skin every 3 (three) months.   Triamcinolone Acetonide (TRIAMCINOLONE 0.1 % CREAM : EUCERIN) CREA Apply 1 application topically 2 (two) times daily.   [DISCONTINUED] famotidine (PEPCID) 20 MG tablet Take 1 tablet by mouth daily.   [DISCONTINUED] losartan (COZAAR) 25 MG tablet Take 1 tablet (25 mg total) by mouth daily.   Radiology:   Barium swallow study 06/07/2019: Schatzki ring at the GE junction, which prevents passage of a 13 mm barium tablet. No evidence of hiatal hernia or gastroesophageal reflux.  High-resolution CT scan of the chest 11/23/2020:  1. The appearance of the lungs is compatible with interstitial lung disease, with a spectrum of findings once again categorized as probable usual interstitial pneumonia (UIP) per current ATS guidelines. Minimal progression of disease compared to the prior examination. 2. Aortic atherosclerosis, in addition to left main and 2 vessel coronary artery disease.  Assessment for potential risk factor modification, dietary therapy or pharmacologic therapy may be warranted, if clinically indicated. 3. Cholelithiasis.  Cardiac Studies:   Lexiscan Tetrofosmin Stress Test  12/21/2020: Nondiagnostic ECG stress. Myocardial perfusion is normal. Overall LV systolic function is normal without regional wall motion abnormalities. Stress LVEF: 51%. No previous exam available for comparison.  Echocardiogram 12/22/2020: Left ventricle cavity is normal in size. Mild concentric hypertrophy of the left ventricle. Normal global wall motion. Normal LV systolic function with EF 63%. Doppler evidence of grade I (impaired) diastolic dysfunction, normal LAP.  Left atrial cavity is normal in size. Aneurysmal interatrial septum without 2D or color Doppler evidence of shunting. Trileaflet aortic valve.  Mild (Grade I) aortic regurgitation. Mild aortic valve leaflet calcification. Mild (Grade I) mitral regurgitation. Mild to moderate tricuspid regurgitation. Estimated pulmonary artery systolic pressure 30 mmHg.  Mild pulmonic regurgitation.   EKG  04/27/2021: Sinus bradycardia rate of 50 bpm.  Left atrial enlargement.  Normal axis.  Low voltage complexes, probably pulmonary disease pattern.  Nonspecific T wave abnormality.  Compared to EKG 09/25/2020, no significant change.  EKG 09/25/2020: Normal sinus rhythm at rate of 69 bpm, left atrial enlargement, normal axis.  Low-voltage complexes.  No evidence of ischemia.  Probable pulmonary disease pattern.   EKG 09/06/2019: Normal sinus rhythm at rate of 62 bpm, left axis deviation, left intrafascicular block. IRBBB. No evidence of ischemia. No significant change from  Prior EKG.   Assessment     ICD-10-CM   1. Precordial pain  R07.2 EKG 12-Lead    2. Essential hypertension  I10 losartan (COZAAR) 50 MG tablet    Basic metabolic panel      Meds ordered this encounter  Medications   losartan (COZAAR) 50 MG tablet    Sig:  Take 1 tablet (50 mg total) by mouth daily.    Dispense:  90 tablet    Refill:  1    Medications Discontinued During This Encounter  Medication Reason   famotidine (PEPCID) 20 MG tablet Duplicate   famotidine (PEPCID) 20 MG tablet Error   losartan (COZAAR) 25 MG tablet     Orders Placed This Encounter  Procedures   Basic metabolic panel   EKG 12-Lead     Recommendations:   Alan Roman  is a 75 y.o. Asian [place] male with history of hypertension, psoriatic arthritis and follows Dr. Pollyann Savoy, very mild hyperlipidemia and coronary calcificaiton by CT chest, recent diagnosis of IPF in Dec 2020, hypertension. He has been started on Ofev since 01/11/2021. Having mild side effects with headache and mild abdominal discomfort.  Due to recent high-resolution CT scan done on 11/23/2020 revealing left main and two-vessel coronary calcification and aortic atherosclerosis, in view of underlying dyspnea, he underwent echocardiogram and stress test.  Echocardiogram revealed mild to moderate TR, normal LVEF and otherwise no significant abnormalities.  Stress test was overall low risk.  Patient was seen in our office 02/05/2021 by Dr. Jacinto Halim who advised as needed follow-up with our office.  Patient now presents for urgent visit with complaints of chest pain.  EKG today is unchanged compared to previous without evidence of acute ischemia or injury.  Patient's physical exam is highly consistent with musculoskeletal etiology of patient's chest pain.  Advised conservative measures and follow-up with PCP, Particularly given recent unremarkable echocardiogram and low risk stress test.  In regard to hypertension, patient's blood pressure is elevated in the office as well as on home readings.  We will increase losartan from 25 mg to 50 mg daily and repeat BMP in 1 week.  Will defer further management of hypertension to patient's  PCP as he is upcoming appointment in November 2022.  Patient is otherwise  stable from a cardiovascular standpoint.  We will see him back on an as-needed basis.   Alan Halsted, PA-C 04/27/2021, 1:26 PM Office: [phone]  CC: Dr. Kalman Shan

## 2021-04-26 NOTE — Telephone Encounter (Signed)
Patient scheduled for urgent visit tomorrow.

## 2021-04-27 ENCOUNTER — Ambulatory Visit: Payer: Medicare Other | Admitting: Student

## 2021-04-27 ENCOUNTER — Other Ambulatory Visit: Payer: Self-pay

## 2021-04-27 ENCOUNTER — Encounter: Payer: Self-pay | Admitting: Student

## 2021-04-27 VITALS — BP 148/82 | HR 50 | Temp 98.2°F | Ht 67.0 in | Wt 158.0 lb

## 2021-04-27 DIAGNOSIS — I10 Essential (primary) hypertension: Secondary | ICD-10-CM

## 2021-04-27 DIAGNOSIS — R072 Precordial pain: Secondary | ICD-10-CM

## 2021-04-27 MED ORDER — LOSARTAN POTASSIUM 50 MG PO TABS: 50.0000 mg | ORAL_TABLET | Freq: Every day | ORAL | 1 refills | Status: DC

## 2021-05-13 ENCOUNTER — Ambulatory Visit: Payer: Medicare Other | Admitting: Internal Medicine

## 2021-05-26 ENCOUNTER — Ambulatory Visit: Payer: Self-pay

## 2021-05-26 ENCOUNTER — Ambulatory Visit: Payer: Medicare Other | Admitting: Orthopaedic Surgery

## 2021-05-26 ENCOUNTER — Encounter: Payer: Self-pay | Admitting: Orthopaedic Surgery

## 2021-05-26 ENCOUNTER — Other Ambulatory Visit: Payer: Self-pay

## 2021-05-26 DIAGNOSIS — M79604 Pain in right leg: Secondary | ICD-10-CM | POA: Diagnosis not present

## 2021-05-26 NOTE — Progress Notes (Signed)
Office Visit Note   Patient: Alan Roman           Date of Birth: [birth year]           MRN: [id number] Visit Date: 05/26/2021              Requested by: Ignatius Specking, MD [address] [place],  [place] [address] PCP: Ignatius Specking, MD   Assessment & Plan: Visit Diagnoses:  1. Pain in right leg     Plan: At least a 3-week history of right leg pain predominantly involving the lateral calf but also experiencing some discomfort along the lateral thigh.  No history of injury or trauma.  Has had some lower back pain.  Seems to have discomfort more when he sits or when he is in bed.  He oftentimes will have to get up and move around or take an Advil which alleviates the pain.  He has not had any numbness or tingling or swelling.  He has had bilateral knee replacements .He relates that his knees are perfectly "fine".  Has not used any ambulatory aid.  Films of the lumbar spine demonstrate some degenerative changes and a slight anterior listhesis of L5 on S1.  Certainly he may be experiencing radicular pain.  Films of the right leg were negative for any obvious abnormality about the lateral aspect of his leg or fibula.  At this point I suspect he may be experiencing radicular pain or claudication.  We will try a course of physical therapy at [facility] for his lumbar spine and have him return in several weeks.  He plans a trip to [place] to visit family at the end of November.  Consider MRI scan of lumbar spine if no improvement  Follow-Up Instructions: No follow-ups on file.   Orders:  Orders Placed This Encounter  Procedures   XR Lumbar Spine 2-3 Views   XR Tibia/Fibula Right   No orders of the defined types were placed in this encounter.     Procedures: No procedures performed   Clinical Data: No additional findings.   Subjective: No chief complaint on file. Patient presents today for right leg pain. He said that he has been having lateral leg pain for 3 weeks. No known injury.  No lower back pain or groin pain. He said that the pain is worse with sitting. He occasionally gets a "shooting pain". He takes Ibuprofen as needed.   HPI  Review of Systems   Objective: Vital Signs: There were no vitals taken for this visit.  Physical Exam Constitutional:      Appearance: He is well-developed.  Eyes:     Pupils: Pupils are equal, round, and reactive to light.  Pulmonary:     Effort: Pulmonary effort is normal.  Skin:    General: Skin is warm and dry.  Neurological:     Mental Status: He is alert and oriented to person, place, and time.  Psychiatric:        Behavior: Behavior normal.    Ortho Exam awake alert and oriented x3.  Comfortable sitting.  No percussible tenderness of lumbar spine.  Painless range of motion both hips.  No significant tenderness along the lateral thigh and no percussible discomfort in the greater trochanteric region or buttock.  Bilateral total knee replacements.  The knees were not hot warm or red and no effusion.  No localized areas of tenderness.  Does have some venous stasis changes of both ankles but no significant swelling.  Neurologically intact both sensory and motor.  No calf pain very minimal discomfort along the lateral right calf  Specialty Comments:  No specialty comments available.  Imaging: XR Lumbar Spine 2-3 Views  Result Date: 05/26/2021 Films of the lumbar spine were obtained in 2 projections.  There is a very slight anterior listhesis of L5 on S1.  Some facet sclerosis at L5-S1 and minimally at L4-5.  Very minimal calcification of the abdominal aorta without obvious aneurysmal dilatation.  Disc spaces appear to be normal.  Minimal i.e. less than 5 degrees right-sided degenerative scoliosis.    PMFS History: Patient Active Problem List   Diagnosis Date Noted   Pain in right leg 05/26/2021   Dysphagia    Gastroesophageal reflux disease    Esophageal stricture    At risk for aspiration 08/07/2019   ILD  (interstitial lung disease) (HCC) 06/03/2019   Elevated diaphragm 06/03/2019   Dyspnea on exertion 05/06/2019   Vocal cord atrophy 05/06/2019   History of total right knee replacement 07/17/2018   Primary osteoarthritis of right knee 05/31/2018   History of tuberculosis 11/05/2015   Orthostatic hypotension 11/05/2015   Constipation 11/05/2015   Thrombocytopenia (HCC) 11/05/2015   Normocytic anemia 11/05/2015   Psoriatic arthritis (HCC) 11/03/2015   Essential hypertension 11/03/2015   Osteoarthritis of left knee 11/03/2015   Status post total knee replacement, left 11/03/2015   Psoriasis 01/23/2013   Immunocompromised state (HCC) 01/23/2013   Past Medical History:  Diagnosis Date   Acute bronchiolitis due to other infectious organisms 01/26/2013   Arthritis    "knees" (11/03/2015)   Extrapulmonary TB (tuberculosis) 02/02/2013   antibx therapy in 2014   GERD (gastroesophageal reflux disease)    on meds   High risk medication use 12/27/2016   History of hiatal hernia 2007; 2009   Hyperlipidemia    on meds   Hypertension    on meds   Pleural effusion 01/26/2013    Family History  Problem Relation Age of Onset   Colon cancer Neg Hx    Stomach cancer Neg Hx    Esophageal cancer Neg Hx    Colon polyps Neg Hx    Rectal cancer Neg Hx     Past Surgical History:  Procedure Laterality Date   BIOPSY  04/01/2021   Procedure: BIOPSY;  Surgeon: Meryl Dare, MD;  Location: [facility];  Service: Endoscopy;;   CATARACT EXTRACTION W/ INTRAOCULAR LENS  IMPLANT, BILATERAL Bilateral    ESOPHAGOGASTRODUODENOSCOPY (EGD) WITH PROPOFOL N/A 04/01/2021   Procedure: ESOPHAGOGASTRODUODENOSCOPY (EGD) WITH PROPOFOL;  Surgeon: Meryl Dare, MD;  Location: [facility];  Service: Endoscopy;  Laterality: N/A;   HIATAL HERNIA REPAIR  2007; 2009   SAVORY DILATION N/A 04/01/2021   Procedure: SAVORY DILATION;  Surgeon: Meryl Dare, MD;  Location: [facility];  Service: Endoscopy;  Laterality:  N/A;   TOTAL KNEE ARTHROPLASTY Left 11/03/2015   TOTAL KNEE ARTHROPLASTY Left 11/03/2015   Procedure: Left Total Knee Arthroplasty;  Surgeon: Valeria Batman, MD;  Location: [facility] OR;  Service: Orthopedics;  Laterality: Left;   TOTAL KNEE ARTHROPLASTY Right 07/17/2018   Procedure: RIGHT TOTAL KNEE ARTHROPLASTY;  Surgeon: Valeria Batman, MD;  Location: [facility] OR;  Service: Orthopedics;  Laterality: Right;   VIDEO BRONCHOSCOPY Bilateral 01/31/2013   Procedure: VIDEO BRONCHOSCOPY WITHOUT FLUORO;  Surgeon: Merwyn Katos, MD;  Location[facility]COPY;  Service: Cardiopulmonary;  Laterality: Bilateral;   Social History   Occupational History   Not on file  Tobacco Use  Smoking status: Never   Smokeless tobacco: Never  Vaping Use   Vaping Use: Never used  Substance and Sexual Activity   Alcohol use: No   Drug use: No   Sexual activity: Not on file

## 2021-05-27 ENCOUNTER — Encounter: Payer: Self-pay | Admitting: Rehabilitative and Restorative Service Providers"

## 2021-05-27 ENCOUNTER — Ambulatory Visit: Payer: Medicare Other | Attending: Orthopaedic Surgery | Admitting: Rehabilitative and Restorative Service Providers"

## 2021-05-27 DIAGNOSIS — M6281 Muscle weakness (generalized): Secondary | ICD-10-CM | POA: Insufficient documentation

## 2021-05-27 DIAGNOSIS — R262 Difficulty in walking, not elsewhere classified: Secondary | ICD-10-CM | POA: Diagnosis not present

## 2021-05-27 DIAGNOSIS — M79604 Pain in right leg: Secondary | ICD-10-CM | POA: Insufficient documentation

## 2021-05-27 DIAGNOSIS — M6289 Other specified disorders of muscle: Secondary | ICD-10-CM | POA: Diagnosis not present

## 2021-05-27 NOTE — Patient Instructions (Signed)
Access Code: [email or web address] URL: [email or web address] Date: 05/27/2021 Prepared by: Clydie Braun Eleni Frank  Exercises Gastroc Stretch on Wall - 2 x daily - 7 x weekly - 1 sets - 3 reps - 15 sec hold Soleus Stretch on Wall - 2 x daily - 7 x weekly - 1 sets - 3 reps - 15 sec hold Seated Hamstring Stretch - 2 x daily - 7 x weekly - 1 sets - 3 reps - 15 sec hold Seated Piriformis Stretch - 2 x daily - 7 x weekly - 1 sets - 3 reps - 15 sec hold Supine Bridge - 1-2 x daily - 7 x weekly - 2 sets - 10 reps

## 2021-05-27 NOTE — Therapy (Signed)
[facility] [address] [place], [place], [address] Phone: [phone]   Fax:  [phone]  Physical Therapy Evaluation  Patient Details  Name: Alan Roman MRN: [id number] Date of Birth: [birth year] Referring Provider (PT): Dr Norlene Campbell   Encounter Date: 05/27/2021   PT End of Session - 05/27/21 1308     Visit Number 1    Date for PT Re-Evaluation 07/02/21    Authorization Type BC/BS Medicare    PT Start Time 1228    PT Stop Time 1308    PT Time Calculation (min) 40 min    Activity Tolerance Patient tolerated treatment well    Behavior During Therapy [facility] for tasks assessed/performed             Past Medical History:  Diagnosis Date   Acute bronchiolitis due to other infectious organisms 01/26/2013   Arthritis    "knees" (11/03/2015)   Extrapulmonary TB (tuberculosis) 02/02/2013   antibx therapy in 2014   GERD (gastroesophageal reflux disease)    on meds   High risk medication use 12/27/2016   History of hiatal hernia 2007; 2009   Hyperlipidemia    on meds   Hypertension    on meds   Pleural effusion 01/26/2013    Past Surgical History:  Procedure Laterality Date   BIOPSY  04/01/2021   Procedure: BIOPSY;  Surgeon: Meryl Dare, MD;  Location: [facility];  Service: Endoscopy;;   CATARACT EXTRACTION W/ INTRAOCULAR LENS  IMPLANT, BILATERAL Bilateral    ESOPHAGOGASTRODUODENOSCOPY (EGD) WITH PROPOFOL N/A 04/01/2021   Procedure: ESOPHAGOGASTRODUODENOSCOPY (EGD) WITH PROPOFOL;  Surgeon: Meryl Dare, MD;  Location: [facility];  Service: Endoscopy;  Laterality: N/A;   HIATAL HERNIA REPAIR  2007; 2009   SAVORY DILATION N/A 04/01/2021   Procedure: SAVORY DILATION;  Surgeon: Meryl Dare, MD;  Location: [facility];  Service: Endoscopy;  Laterality: N/A;   TOTAL KNEE ARTHROPLASTY Left 11/03/2015   TOTAL KNEE ARTHROPLASTY Left 11/03/2015   Procedure: Left Total Knee Arthroplasty;   Surgeon: Valeria Batman, MD;  Location: [facility] OR;  Service: Orthopedics;  Laterality: Left;   TOTAL KNEE ARTHROPLASTY Right 07/17/2018   Procedure: RIGHT TOTAL KNEE ARTHROPLASTY;  Surgeon: Valeria Batman, MD;  Location: [facility] OR;  Service: Orthopedics;  Laterality: Right;   VIDEO BRONCHOSCOPY Bilateral 01/31/2013   Procedure: VIDEO BRONCHOSCOPY WITHOUT FLUORO;  Surgeon: Merwyn Katos, MD;  Location: [facility] ENDOSCOPY;  Service: Cardiopulmonary;  Laterality: Bilateral;    There were no vitals filed for this visit.    Subjective Assessment - 05/27/21 1230     Subjective Pt reports that for about the past 3 weeks he has had severe R leg pain. Pt is having difficulty sleeping due to increased pain radiating down his RLE. Unknown etiology.    Pertinent History B TKA    Limitations Standing    How long can you stand comfortably? 1-2 min    How long can you walk comfortably? 25-30 min    Diagnostic tests Films of the lumbar spine demonstrate some degenerative changes and a slight anterior listhesis of L5 on S1.    Patient Stated Goals I want to stop that pain.    Currently in Pain? Yes    Pain Score 9     Pain Location Leg    Pain Orientation Right    Pain Descriptors / Indicators Radiating;Constant    Pain Type Acute pain    Pain Onset 1 to 4 weeks  ago    Pain Frequency Constant                [facility] PT Assessment - 05/27/21 0001       Assessment   Medical Diagnosis Pain in the Right Leg    Referring Provider (PT) Dr Norlene Campbell    Hand Dominance Right    Next MD Visit 06/09/2021    Prior Therapy Pt currently working with Speech Therapy      Precautions   Precautions Fall      Restrictions   Weight Bearing Restrictions No      Balance Screen   Has the patient fallen in the past 6 months No    Has the patient had a decrease in activity level because of a fear of falling?  No    Is the patient reluctant to leave their home because of a fear of falling?  No      Home  Environment   Living Environment Private residence    Living Arrangements Spouse/significant other    Type of Home House    Home Access Stairs to enter    Home Layout Two level;Able to live on main level with bedroom/bathroom      Prior Function   Level of Independence Independent    Vocation Retired    Leisure spending time with grandchildren      Cognition   Overall Cognitive Status Within Functional Limits for tasks assessed      Observation/Other Assessments   Focus on Therapeutic Outcomes (FOTO)  55%   Projected 70%     ROM / Strength   AROM / PROM / Strength Strength      Strength   Overall Strength Comments RLE strenth grossly 4/5      Flexibility   Soft Tissue Assessment /Muscle Length yes    Hamstrings tight R hamstring    ITB tight R side    Piriformis tight R side      Palpation   Palpation comment Pt with some trigger points and muscle tightness noted over R lumbar paraspinals      Transfers   Five time sit to stand comments  11.2 sec with pushing up from thighs                        Objective measurements completed on examination: See above findings.       [facility] Adult PT Treatment/Exercise - 05/27/21 0001       Exercises   Exercises Knee/Hip      Manual Therapy   Manual Therapy Soft tissue mobilization;Myofascial release    Manual therapy comments in sitting    Soft tissue mobilization STM to R calf region and IT band    Myofascial Release manual trigger point release to R lateral soleus and gastroc                     PT Education - 05/27/21 1259     Education Details Pt educated in HEP    Person(s) Educated Patient    Methods Explanation;Demonstration;Handout    Comprehension Verbalized understanding              PT Short Term Goals - 05/27/21 1319       PT SHORT TERM GOAL #1   Title Pt will be independent with initial HEP.    Time 2    Period Weeks    Status New  PT Long Term  Goals - 05/27/21 1320       PT LONG TERM GOAL #1   Title Pt will be independent with advanced HEP.    Time 6    Period Weeks    Status New      PT LONG TERM GOAL #2   Title Pt will increase FOTO to 70% to indicate improved functional mobility.    Time 6    Period Weeks    Status New      PT LONG TERM GOAL #3   Title Pt will increase R LE strength to at least 4+/5 to allow him to negoiate steps with ease.    Time 6    Period Weeks    Status New      PT LONG TERM GOAL #4   Title Pt will report pain no greater than 3/10 at night to allow him to sleep.    Time 6    Period Weeks    Status New                    Plan - 05/27/21 1310     Clinical Impression Statement Pt is a 75 y.o. male referred to outpatient PT from Dr Cleophas Dunker secondary to R leg pain. Radiograph films of the lumbar spine revealed some degenerative changes and a slight anterior listhesis of L5/S1. Pt states that Dr Cleophas Dunker wanted him to try PT and would do a MRI if pt still having pain.  Pts PLOF is independent with functional mobility and able to walk and perform household tasks without pain. Pt planning to travel to visit his sister in [place] on 06/29/21 and will be gone for approximately 2.5 months. Pt presents with RLE weakness, RLE pain, muscle tightness, and difficulty walking. Pt would benefit from skilled PT to address his functional impairments to allow him to return to ambulation without pain.    Personal Factors and Comorbidities Comorbidity 1    Comorbidities B TKA    Examination-Activity Limitations Locomotion Level;Squat;Stand    Examination-Participation Restrictions Community Activity    Stability/Clinical Decision Making Stable/Uncomplicated    Clinical Decision Making Low    Rehab Potential Good    PT Frequency 2x / week    PT Duration 6 weeks    PT Treatment/Interventions ADLs/Self Care Home Management;Aquatic Therapy;Cryotherapy;Electrical Stimulation;Iontophoresis 4mg /ml  Dexamethasone;Moist Heat;Traction;Ultrasound;Gait training;Stair training;Functional mobility training;Therapeutic activities;Therapeutic exercise;Balance training;Neuromuscular re-education;Patient/family education;Manual techniques;Passive range of motion;Dry needling;Taping;Vasopneumatic Device;Spinal Manipulations;Joint Manipulations    PT Next Visit Plan assess and progress HEP, strengthening, flexibility    PT Home Exercise Plan Access Code    Consulted and Agree with Plan of Care Patient             Patient will benefit from skilled therapeutic intervention in order to improve the following deficits and impairments:  Difficulty walking, Increased muscle spasms, Pain, Impaired flexibility, Decreased strength  Visit Diagnosis: Muscle weakness (generalized) - Plan: PT plan of care cert/re-cert  Difficulty in walking, not elsewhere classified - Plan: PT plan of care cert/re-cert  Hamstring tightness - Plan: PT plan of care cert/re-cert  Pain in right leg - Plan: PT plan of care cert/re-cert     Problem List Patient Active Problem List   Diagnosis Date Noted   Pain in right leg 05/26/2021   Dysphagia    Gastroesophageal reflux disease    Esophageal stricture    At risk for aspiration 08/07/2019   ILD (interstitial lung disease) (HCC) 06/03/2019   Elevated  diaphragm 06/03/2019   Dyspnea on exertion 05/06/2019   Vocal cord atrophy 05/06/2019   History of total right knee replacement 07/17/2018   Primary osteoarthritis of right knee 05/31/2018   History of tuberculosis 11/05/2015   Orthostatic hypotension 11/05/2015   Constipation 11/05/2015   Thrombocytopenia (HCC) 11/05/2015   Normocytic anemia 11/05/2015   Psoriatic arthritis (HCC) 11/03/2015   Essential hypertension 11/03/2015   Osteoarthritis of left knee 11/03/2015   Status post total knee replacement, left 11/03/2015   Psoriasis 01/23/2013   Immunocompromised state (HCC) 01/23/2013    Reather Laurence,  PT, DPT 05/27/2021, 1:24 PM  [facility] [address] [place], [place], [address] Phone: [phone]   Fax:  [phone]  Name: SHAMMOND ARAVE MRN: [id number] Date of Birth: August 04, 1945

## 2021-05-31 ENCOUNTER — Encounter: Payer: Self-pay | Admitting: Physical Therapy

## 2021-05-31 ENCOUNTER — Ambulatory Visit: Payer: Medicare Other | Admitting: Physical Therapy

## 2021-05-31 ENCOUNTER — Telehealth: Payer: Self-pay | Admitting: Nurse Practitioner

## 2021-05-31 ENCOUNTER — Other Ambulatory Visit: Payer: Self-pay

## 2021-05-31 DIAGNOSIS — R262 Difficulty in walking, not elsewhere classified: Secondary | ICD-10-CM

## 2021-05-31 DIAGNOSIS — M6281 Muscle weakness (generalized): Secondary | ICD-10-CM

## 2021-05-31 DIAGNOSIS — M79604 Pain in right leg: Secondary | ICD-10-CM

## 2021-05-31 DIAGNOSIS — M6289 Other specified disorders of muscle: Secondary | ICD-10-CM

## 2021-05-31 NOTE — Telephone Encounter (Signed)
I received a copy of the patient's colonoscopy completed by Dr. Loreta Ave 04/20/2015.  The quality of the bowel preparation was adequate.  Multiple small mouth diverticula were found in the entire colon.  The terminal ileum appeared normal.  Small internal hemorrhoids were noted on retroflexion.  Recall colonoscopy in 10 years.  Melissa, please enter a colonoscopy recall date September 2026.  The patient will be age 75 at that time therefore his overall health status will need to be assessed to verify if a colonoscopy appropriate at that time or not.

## 2021-05-31 NOTE — Therapy (Signed)
[facility] [address] [place], [place], [address] Phone: [phone]   Fax:  [phone]  Physical Therapy Treatment  Patient Details  Name: Alan Roman MRN: [id number] Date of Birth: [birth year] Referring Provider (PT): Dr Norlene Campbell   Encounter Date: 05/31/2021   PT End of Session - 05/31/21 1113     Visit Number 2    Date for PT Re-Evaluation 07/02/21    Authorization Type BC/BS Medicare    PT Start Time 1022    PT Stop Time 1100    PT Time Calculation (min) 38 min    Activity Tolerance Patient tolerated treatment well;No increased pain    Behavior During Therapy [facility] for tasks assessed/performed             Past Medical History:  Diagnosis Date   Acute bronchiolitis due to other infectious organisms 01/26/2013   Arthritis    "knees" (11/03/2015)   Extrapulmonary TB (tuberculosis) 02/02/2013   antibx therapy in 2014   GERD (gastroesophageal reflux disease)    on meds   High risk medication use 12/27/2016   History of hiatal hernia 2007; 2009   Hyperlipidemia    on meds   Hypertension    on meds   Pleural effusion 01/26/2013    Past Surgical History:  Procedure Laterality Date   BIOPSY  04/01/2021   Procedure: BIOPSY;  Surgeon: Meryl Dare, MD;  Location: [facility] ENDOSCOPY;  Service: Endoscopy;;   CATARACT EXTRACTION W/ INTRAOCULAR LENS  IMPLANT, BILATERAL Bilateral    ESOPHAGOGASTRODUODENOSCOPY (EGD) WITH PROPOFOL N/A 04/01/2021   Procedure: ESOPHAGOGASTRODUODENOSCOPY (EGD) WITH PROPOFOL;  Surgeon: Meryl Dare, MD;  Location: [facility];  Service: Endoscopy;  Laterality: N/A;   HIATAL HERNIA REPAIR  2007; 2009   SAVORY DILATION N/A 04/01/2021   Procedure: SAVORY DILATION;  Surgeon: Meryl Dare, MD;  Location: [facility];  Service: Endoscopy;  Laterality: N/A;   TOTAL KNEE ARTHROPLASTY Left 11/03/2015   TOTAL KNEE ARTHROPLASTY Left 11/03/2015   Procedure: Left Total Knee  Arthroplasty;  Surgeon: Valeria Batman, MD;  Location: [facility] OR;  Service: Orthopedics;  Laterality: Left;   TOTAL KNEE ARTHROPLASTY Right 07/17/2018   Procedure: RIGHT TOTAL KNEE ARTHROPLASTY;  Surgeon: Valeria Batman, MD;  Location: [facility] OR;  Service: Orthopedics;  Laterality: Right;   VIDEO BRONCHOSCOPY Bilateral 01/31/2013   Procedure: VIDEO BRONCHOSCOPY WITHOUT FLUORO;  Surgeon: Merwyn Katos, MD;  Location: [facility] ENDOSCOPY;  Service: Cardiopulmonary;  Laterality: Bilateral;    There were no vitals filed for this visit.   Subjective Assessment - 05/31/21 1109     Subjective Pt states that his pain is not changed from his first visit. He has been working on his HEP.    Pertinent History B TKA    Limitations Standing    How long can you stand comfortably? 1-2 min    How long can you walk comfortably? 25-30 min    Diagnostic tests Films of the lumbar spine demonstrate some degenerative changes and a slight anterior listhesis of L5 on S1.    Patient Stated Goals I want to stop that pain.    Currently in Pain? Yes   no change since last visit   Pain Onset 1 to 4 weeks ago                               [facility] Adult PT Treatment/Exercise - 05/31/21 0001  Knee/Hip Exercises: Stretches   Other Knee/Hip Stretches Rt sciatic nerve stretch x10 reps      Manual Therapy   Soft tissue mobilization STM Rt lateral gastroc, soleus, Rt peroneals              Trigger Point Dry Needling - 05/31/21 0001     Consent Given? Yes    Education Handout Provided Previously provided    Muscles Treated Lower Quadrant Peroneals    Other Dry Needling skilled soft tissue palpation completed during dry needling    Peroneals Response Twitch response elicited;Palpable increased muscle length   Rt                  PT Education - 05/31/21 1113     Education Details walking mechanics    Person(s) Educated Patient    Methods Explanation    Comprehension Verbalized  understanding              PT Short Term Goals - 05/27/21 1319       PT SHORT TERM GOAL #1   Title Pt will be independent with initial HEP.    Time 2    Period Weeks    Status New               PT Long Term Goals - 05/27/21 1320       PT LONG TERM GOAL #1   Title Pt will be independent with advanced HEP.    Time 6    Period Weeks    Status New      PT LONG TERM GOAL #2   Title Pt will increase FOTO to 70% to indicate improved functional mobility.    Time 6    Period Weeks    Status New      PT LONG TERM GOAL #3   Title Pt will increase R LE strength to at least 4+/5 to allow him to negoiate steps with ease.    Time 6    Period Weeks    Status New      PT LONG TERM GOAL #4   Title Pt will report pain no greater than 3/10 at night to allow him to sleep.    Time 6    Period Weeks    Status New                   Plan - 05/31/21 1113     Clinical Impression Statement Pt had his first treatment today and noted no change in his constant Rt lower leg pain. PT assisted pt with sciatic nerve flossing exercise and pt reported no increase in pain with this. Pt had significant trigger points in the Rt peroneals and was agreeable to dry needling as he has had this in the past. There were several strong twitch responses elicited. PT followed this with soft tissue and joint mobilization to the area. Pt treported "feeling like the area had relaxed" following this treatment. Pt has altered gait mechanics, noting minimal toe push off on the Rt, so PT provided brief education on this. Pt feels he would like to reach out to his referring physician regarding an MRI, but in the mean time we will continue to address tissue restrictions and decrease pain for him improved quality of life.    Personal Factors and Comorbidities Comorbidity 1    Comorbidities B TKA    Examination-Activity Limitations Locomotion Level;Squat;Stand    Examination-Participation Restrictions  Community Activity    Stability/Clinical Decision  Making Stable/Uncomplicated    Rehab Potential Good    PT Frequency 2x / week    PT Duration 6 weeks    PT Treatment/Interventions ADLs/Self Care Home Management;Aquatic Therapy;Cryotherapy;Electrical Stimulation;Iontophoresis 4mg /ml Dexamethasone;Moist Heat;Traction;Ultrasound;Gait training;Stair training;Functional mobility training;Therapeutic activities;Therapeutic exercise;Balance training;Neuromuscular re-education;Patient/family education;Manual techniques;Passive range of motion;Dry needling;Taping;Vasopneumatic Device;Spinal Manipulations;Joint Manipulations    PT Next Visit Plan f/u on DN to peroneals-more DN to Rt Lateral gastroc/soleus; progress gastroc strength    PT Home Exercise Plan Access Code    Consulted and Agree with Plan of Care Patient             Patient will benefit from skilled therapeutic intervention in order to improve the following deficits and impairments:  Difficulty walking, Increased muscle spasms, Pain, Impaired flexibility, Decreased strength  Visit Diagnosis: Muscle weakness (generalized)  Difficulty in walking, not elsewhere classified  Hamstring tightness  Pain in right leg     Problem List Patient Active Problem List   Diagnosis Date Noted   Pain in right leg 05/26/2021   Dysphagia    Gastroesophageal reflux disease    Esophageal stricture    At risk for aspiration 08/07/2019   ILD (interstitial lung disease) (HCC) 06/03/2019   Elevated diaphragm 06/03/2019   Dyspnea on exertion 05/06/2019   Vocal cord atrophy 05/06/2019   History of total right knee replacement 07/17/2018   Primary osteoarthritis of right knee 05/31/2018   History of tuberculosis 11/05/2015   Orthostatic hypotension 11/05/2015   Constipation 11/05/2015   Thrombocytopenia (HCC) 11/05/2015   Normocytic anemia 11/05/2015   Psoriatic arthritis (HCC) 11/03/2015   Essential hypertension 11/03/2015    Osteoarthritis of left knee 11/03/2015   Status post total knee replacement, left 11/03/2015   Psoriasis 01/23/2013   Immunocompromised state (HCC) 01/23/2013   11:22 AM,05/31/21 06/02/21 PT, DPT [facility] [place]  [phone]   [facility] @ [facility] [address] [place], [place], [place] Phone: [phone]   Fax:  [phone]  Name: TRENELL CONCANNON MRN: Norvel Richards Date of Birth: Dec 20, 1945

## 2021-06-01 ENCOUNTER — Other Ambulatory Visit: Payer: Self-pay

## 2021-06-01 ENCOUNTER — Telehealth: Payer: Self-pay | Admitting: Orthopaedic Surgery

## 2021-06-01 DIAGNOSIS — M79604 Pain in right leg: Secondary | ICD-10-CM

## 2021-06-01 NOTE — Telephone Encounter (Signed)
Recall placed

## 2021-06-01 NOTE — Telephone Encounter (Signed)
MRI lumbar spine    

## 2021-06-01 NOTE — Telephone Encounter (Signed)
Called and spoke with patient. MRI has not been ordered. He is aware that they will call once insurance approves and we want to see him in the office to go over the results.

## 2021-06-01 NOTE — Telephone Encounter (Signed)
Pt called and states his right leg is giving him problems. He is wondering if he is suppose to be getting an mri?   Cb [phone]

## 2021-06-02 ENCOUNTER — Telehealth: Payer: Self-pay | Admitting: Orthopaedic Surgery

## 2021-06-02 ENCOUNTER — Ambulatory Visit
Admission: RE | Admit: 2021-06-02 | Discharge: 2021-06-02 | Disposition: A | Discharge status: Home / Self Care | Payer: Medicare Other | Source: Ambulatory Visit | Attending: Orthopaedic Surgery | Admitting: Orthopaedic Surgery

## 2021-06-02 DIAGNOSIS — M79604 Pain in right leg: Secondary | ICD-10-CM

## 2021-06-02 NOTE — Telephone Encounter (Signed)
Pt called requesting a call back. Pt asking for a sooner appt. Please call pt at [phone].

## 2021-06-03 ENCOUNTER — Other Ambulatory Visit: Payer: Self-pay | Admitting: Orthopaedic Surgery

## 2021-06-03 ENCOUNTER — Other Ambulatory Visit: Payer: Medicare Other

## 2021-06-03 ENCOUNTER — Telehealth: Payer: Self-pay | Admitting: Orthopaedic Surgery

## 2021-06-03 ENCOUNTER — Ambulatory Visit: Payer: Medicare Other | Attending: Orthopaedic Surgery | Admitting: Physical Therapy

## 2021-06-03 ENCOUNTER — Other Ambulatory Visit: Payer: Self-pay

## 2021-06-03 DIAGNOSIS — M6281 Muscle weakness (generalized): Secondary | ICD-10-CM | POA: Insufficient documentation

## 2021-06-03 DIAGNOSIS — M79604 Pain in right leg: Secondary | ICD-10-CM | POA: Diagnosis present

## 2021-06-03 DIAGNOSIS — R262 Difficulty in walking, not elsewhere classified: Secondary | ICD-10-CM | POA: Diagnosis present

## 2021-06-03 DIAGNOSIS — M6289 Other specified disorders of muscle: Secondary | ICD-10-CM | POA: Diagnosis present

## 2021-06-03 MED ORDER — TRAMADOL HCL 50 MG PO TABS: 50.0000 mg | ORAL_TABLET | Freq: Four times a day (QID) | ORAL | 0 refills | Status: DC | PRN

## 2021-06-03 NOTE — Telephone Encounter (Signed)
Pt calling asking for some kind of pain medication. Pt has an appt sch'd for 06/09/21 but wanted something prescribed to help until then. The best pharmacy is [facility] [phone] - [place], [place] - [address] AT [address] and the best call back number is [phone].

## 2021-06-03 NOTE — Telephone Encounter (Signed)
Called and spoke with patient. Told him that his medication had been sent to his pharmacy, and that we would see him back in the office after he has his MRI scan.

## 2021-06-03 NOTE — Telephone Encounter (Signed)
Tramadol sent to [facility] on [address]-please call pt and relate

## 2021-06-03 NOTE — Therapy (Signed)
[facility] @ [facility] [address] [place], [place], [address] Phone: [phone]   Fax:  [phone]  Physical Therapy Treatment  Patient Details  Name: Alan Roman MRN: [id number] Date of Birth: 1946/04/19 Referring Provider (PT): Dr Norlene Campbell   Encounter Date: 06/03/2021   PT End of Session - 06/03/21 1850     Visit Number 3    Date for PT Re-Evaluation 07/02/21    Authorization Type BC/BS Medicare    PT Start Time 1015    PT Stop Time 1100    PT Time Calculation (min) 45 min    Activity Tolerance Patient tolerated treatment well             Past Medical History:  Diagnosis Date   Acute bronchiolitis due to other infectious organisms 01/26/2013   Arthritis    "knees" (11/03/2015)   Extrapulmonary TB (tuberculosis) 02/02/2013   antibx therapy in 2014   GERD (gastroesophageal reflux disease)    on meds   High risk medication use 12/27/2016   History of hiatal hernia 2007; 2009   Hyperlipidemia    on meds   Hypertension    on meds   Pleural effusion 01/26/2013    Past Surgical History:  Procedure Laterality Date   BIOPSY  04/01/2021   Procedure: BIOPSY;  Surgeon: Meryl Dare, MD;  Location: [facility];  Service: Endoscopy;;   CATARACT EXTRACTION W/ INTRAOCULAR LENS  IMPLANT, BILATERAL Bilateral    ESOPHAGOGASTRODUODENOSCOPY (EGD) WITH PROPOFOL N/A 04/01/2021   Procedure: ESOPHAGOGASTRODUODENOSCOPY (EGD) WITH PROPOFOL;  Surgeon: Meryl Dare, MD;  Location: [facility];  Service: Endoscopy;  Laterality: N/A;   HIATAL HERNIA REPAIR  2007; 2009   SAVORY DILATION N/A 04/01/2021   Procedure: SAVORY DILATION;  Surgeon: Meryl Dare, MD;  Location: [facility];  Service: Endoscopy;  Laterality: N/A;   TOTAL KNEE ARTHROPLASTY Left 11/03/2015   TOTAL KNEE ARTHROPLASTY Left 11/03/2015   Procedure: Left Total Knee Arthroplasty;  Surgeon: Valeria Batman, MD;  Location: [facility] OR;  Service:  Orthopedics;  Laterality: Left;   TOTAL KNEE ARTHROPLASTY Right 07/17/2018   Procedure: RIGHT TOTAL KNEE ARTHROPLASTY;  Surgeon: Valeria Batman, MD;  Location: [facility] OR;  Service: Orthopedics;  Laterality: Right;   VIDEO BRONCHOSCOPY Bilateral 01/31/2013   Procedure: VIDEO BRONCHOSCOPY WITHOUT FLUORO;  Surgeon: Merwyn Katos, MD;  Location: [facility] ENDOSCOPY;  Service: Cardiopulmonary;  Laterality: Bilateral;    There were no vitals filed for this visit.   Subjective Assessment - 06/03/21 1018     Subjective The DN might have helped but when I raised my arm it came back.  It hurts all night.  Constant.  Had MRI yesterday but no results yet.    Pertinent History B TKA    Diagnostic tests Films of the lumbar spine demonstrate some degenerative changes and a slight anterior listhesis of L5 on S1.    Currently in Pain? Yes    Pain Score 9     Pain Location Leg    Pain Orientation Right    Pain Type Acute pain                               [facility] Adult PT Treatment/Exercise - 06/03/21 0001       Self-Care   Self-Care Other Self-Care Comments    Other Self-Care Comments  discussed use of roller at home      Knee/Hip  Exercises: Supine   Other Supine Knee/Hip Exercises peroneal nerve glide 10x      Modalities   Modalities Electrical Stimulation;Moist Heat      Moist Heat Therapy   Number Minutes Moist Heat 3 Minutes    Moist Heat Location --   right lower leg     Electrical Stimulation   Electrical Stimulation Location right lateral leg with DN    Electrical Stimulation Action pre mod 1.5 8 min    Electrical Stimulation Goals Pain      Manual Therapy   Soft tissue mobilization STM Rt lateral gastroc, soleus, Rt peroneals    Kinesiotex Facilitate Muscle      Kinesiotix   Facilitate Muscle  2 I strips lateral lower leg along peroneals and lateral gastroc              Trigger Point Dry Needling - 06/03/21 0001     Consent Given? Yes    Education  Handout Provided Previously provided    Muscles Treated Lower Quadrant Peroneals    Electrical Stimulation Performed with Dry Needling Yes    E-stim with Dry Needling Details 8 min 1.5 ma    Other Dry Needling skilled soft tissue palpation completed during dry needling    Peroneals Response Twitch response elicited;Palpable increased muscle length   Rt   Gastrocnemius Response Palpable increased muscle length   lateral                    PT Short Term Goals - 05/27/21 1319       PT SHORT TERM GOAL #1   Title Pt will be independent with initial HEP.    Time 2    Period Weeks    Status New               PT Long Term Goals - 05/27/21 1320       PT LONG TERM GOAL #1   Title Pt will be independent with advanced HEP.    Time 6    Period Weeks    Status New      PT LONG TERM GOAL #2   Title Pt will increase FOTO to 70% to indicate improved functional mobility.    Time 6    Period Weeks    Status New      PT LONG TERM GOAL #3   Title Pt will increase R LE strength to at least 4+/5 to allow him to negoiate steps with ease.    Time 6    Period Weeks    Status New      PT LONG TERM GOAL #4   Title Pt will report pain no greater than 3/10 at night to allow him to sleep.    Time 6    Period Weeks    Status New                   Plan - 06/03/21 1041     Clinical Impression Statement The patient presents with high pain rating of 9/10 and multiple tender points in right peroneal muscles and lateral gastroc.  Performed DN to these muscles with added ES for longer lasting benefit.  Also trial of KT for support.  Encouraged peroneal nerve gliding for home.  Much improved soft tissue mobility noted and decreased tender point size and number following treatment session.    Examination-Activity Limitations Locomotion Level;Squat;Stand    Examination-Participation Restrictions Community Activity    Rehab Potential Good  PT Frequency 2x / week    PT  Duration 6 weeks    PT Treatment/Interventions ADLs/Self Care Home Management;Aquatic Therapy;Cryotherapy;Electrical Stimulation;Iontophoresis 4mg /ml Dexamethasone;Moist Heat;Traction;Ultrasound;Gait training;Stair training;Functional mobility training;Therapeutic activities;Therapeutic exercise;Balance training;Neuromuscular re-education;Patient/family education;Manual techniques;Passive range of motion;Dry needling;Taping;Vasopneumatic Device;Spinal Manipulations;Joint Manipulations    PT Next Visit Plan f/u on DN to peroneals with ES;  KT if helpful;   progress gastroc strength;  check results of MRI and his follow up with doctor    PT Home Exercise Plan Access Code             Patient will benefit from skilled therapeutic intervention in order to improve the following deficits and impairments:  Difficulty walking, Increased muscle spasms, Pain, Impaired flexibility, Decreased strength  Visit Diagnosis: Muscle weakness (generalized)  Difficulty in walking, not elsewhere classified  Pain in right leg     Problem List Patient Active Problem List   Diagnosis Date Noted   Pain in right leg 05/26/2021   Dysphagia    Gastroesophageal reflux disease    Esophageal stricture    At risk for aspiration 08/07/2019   ILD (interstitial lung disease) (HCC) 06/03/2019   Elevated diaphragm 06/03/2019   Dyspnea on exertion 05/06/2019   Vocal cord atrophy 05/06/2019   History of total right knee replacement 07/17/2018   Primary osteoarthritis of right knee 05/31/2018   History of tuberculosis 11/05/2015   Orthostatic hypotension 11/05/2015   Constipation 11/05/2015   Thrombocytopenia (HCC) 11/05/2015   Normocytic anemia 11/05/2015   Psoriatic arthritis (HCC) 11/03/2015   Essential hypertension 11/03/2015   Osteoarthritis of left knee 11/03/2015   Status post total knee replacement, left 11/03/2015   Psoriasis 01/23/2013   Immunocompromised state (HCC) 01/23/2013   01/25/2013, PT 06/03/21 7:10 PM Phone: [phone] Fax: [phone]  [phone], PT 06/03/2021, 7:06 PM  [facility] Out[facility] Re[facility] @ [facility] [address] [place], [place], [place] Phone: [phone]   Fax:  [phone]  Name: DURWOOD DITTUS MRN: Norvel Richards Date of Birth: 05-27-1946

## 2021-06-04 ENCOUNTER — Telehealth: Payer: Self-pay | Admitting: Orthopaedic Surgery

## 2021-06-04 ENCOUNTER — Encounter: Payer: Self-pay | Admitting: Orthopaedic Surgery

## 2021-06-04 NOTE — Telephone Encounter (Signed)
Pt daughter called and would like to know the results of pt MRI.   CB [phone]

## 2021-06-07 ENCOUNTER — Other Ambulatory Visit: Payer: Self-pay

## 2021-06-07 ENCOUNTER — Telehealth: Payer: Self-pay | Admitting: Internal Medicine

## 2021-06-07 ENCOUNTER — Ambulatory Visit: Payer: Medicare Other | Admitting: Internal Medicine

## 2021-06-07 ENCOUNTER — Encounter: Payer: Self-pay | Admitting: Internal Medicine

## 2021-06-07 VITALS — BP 118/68 | HR 79 | Ht 67.0 in | Wt 156.4 lb

## 2021-06-07 DIAGNOSIS — J84112 Idiopathic pulmonary fibrosis: Secondary | ICD-10-CM | POA: Diagnosis not present

## 2021-06-07 DIAGNOSIS — Z5181 Encounter for therapeutic drug level monitoring: Secondary | ICD-10-CM | POA: Diagnosis not present

## 2021-06-07 DIAGNOSIS — I158 Other secondary hypertension: Secondary | ICD-10-CM | POA: Diagnosis not present

## 2021-06-07 DIAGNOSIS — Z7184 Encounter for health counseling related to travel: Secondary | ICD-10-CM | POA: Diagnosis not present

## 2021-06-07 DIAGNOSIS — T50905A Adverse effect of unspecified drugs, medicaments and biological substances, initial encounter: Secondary | ICD-10-CM

## 2021-06-07 NOTE — Telephone Encounter (Signed)
Alan Roman  Please give Alan Roman my office note latest and also when he comes on 06/15/21 ask PFT tech to give him his PFT so he can take it to [place]   THAnks    SIGNATURE    Dr. Kalman Shan, M.D., F.C.C.P,  Pulmonary and Critical Care Medicine Staff Physician, [facility] Center Director - Interstitial Lung Disease  Program  Pulmonary Fibrosis [facility] [place], [place], [address]  NPI Number:  NPI #[id number]  Pager: [phone], If no answer  -> Check AMION or Try [phone] Telephone (clinical office): [phone] Telephone (research): [phone]  11:13 AM 06/07/2021

## 2021-06-07 NOTE — Progress Notes (Signed)
NOV 2020 with Dr Elsworth Soho  75 year old never smoker of [place] origin initial consult 05/2019  for evaluation of hoarseness, dry cough and congestion. He is originally from [place], [place], [place] to the [place] in 1971 for his bachelors to [employer] and then settled in [place] in 1978 and got into the [employer].  PMH - - psoriatic arthritis by Dr. Metta Clines, was originally on Humira and is now on Cosentyx. - latent TB that was untreated initially and in 12/2012 he presented with a mild alveolitis on CT scan and pleural effusion that required thoracentesis, bronchoscopy was negative for AFB but he was treated under the guidance of local health department and ID with multiple drug antituberculous treatment for 3 months.  His problem lists extrapulmonary TB but on my review all AFB cultures were negative.   -07/2018 chest x-ray shows eventration of right hemidiaphragm.   - vocal cord paralysis in 04/2015 and underwent ENT evaluation for persistent dysphonia and underwent gel injection of the vocal cords at [facility] without any relief  Chief Complaint  Patient presents with   Follow-up    1 month rov- review CT chest 10/31.  pt states his s/s are unchanged since last month, prod cough with clear mucus qam that subsides throughout the day.      Symptoms are persistent. Main issues are hoarseness, dry cough and congestion  We reviewed HRCT in detail today  CT angiogram 10/2015 -which shows mild reticular densities suggestive but not diagnostic of ILD.  HRCT 05/31/19 mild, bibasilar predominant pulmonary fibrosis in a pattern featuring subtle subpleural reticular interstitial opacity, mild traction bronchiectasis and occasional areas of Bronchiolectasis - ' probable UIP ' - slight worse   OV [name]  75 year old male never smoker presenting to our office today after completing pulmonary function testing.  Patient was last seen in our office in October/2020  for initial consult with Dr. Elsworth Soho.  At that office visit patient was scheduled for pulmonary function testing, lab work was completed to further evaluate patient's probable UIP on October/2020 high-resolution CT chest, patient was referred to ear nose and throat, swallow studies were also ordered on the patient.  Patient completed ENT referral in 07/02/2019 with Dr. Wellington Hampshire Recommendations for voice therapy, that he follow-up with gastroenterology regarding this Schatzki's ring.  Follow-up in April.  Proceed forward with pulmonary pulmonary function test.  Patient reports that he is received a telephone call to restart voice rehab but he has not done this yet as he wanted to review pulmonary function testing with our office first.  Patient swallow studies are listed below:  06/06/2019-swallow study-mild aspiration risk, no treatment recommended at this time 06/07/2019-barium swallow-Schatzki ring at the GE junction which prevents passage of a 13 mm barium tablet  Patient has a gastroenterologist Dr. Collene Mares but he has not seen her in over a year.  Patient has no scheduled upcoming appointments.  Patient continues to follow-up with rheumatology, Dr. Estanislado Pandy.  Per chart review last office note was in July/2020.  Patient presenting today to review December/2020 pulmonary function test.  Those results are listed below:  07/19/2019-pulmonary function test-FVC 2.32 (63% predicted), postbronchodilator ratio 93, postbronchodilator FEV1 1.93 (69% predicted), DLCO 13.59 (43% predicted) >>>Severe restriction, moderately severe diffusion defect  Lab work from [place] are also listed below:  06/03/2019-ANA-1: 40, nuclear homogenous 06/03/2019-rheumatoid factor-14 06/03/2019-anti-Jo 1-less than 0.2 06/03/2019-aldolase-4.9 06/03/2019-centromere antibodies-negative 06/03/2019-antiscleroderma antibody-less than 1 - 06/03/2019-Sjogren's syndrome 8 and B-negative 06/03/2019-hypersensitivity  pneumonitis-negative 06/03/2019-sed rate  22  Mildly elevated ANA and rheumatoid factor.  Felt to be not clinically significant.  Rest of results are negative.  Patient continues to report that he is asymptomatic.  Patient tolerates walk in office without any oxygen desaturations.  He does continue to have occasional bouts of congestion which is believed to be related to the Cosentyx.  Family has messaged rheumatology regarding this.  Tests:   07/19/2019-pulmonary function test-FVC 2.32 (63% predicted), postbronchodilator ratio 93, postbronchodilator FEV1 1.93 (69% predicted), DLCO 13.59 (43% predicted) >>>Severe restriction, moderately severe diffusion defect  07/16/2019-SARS-CoV-2-not detected  06/03/2019-ANA-1: 40, nuclear homogenous 06/03/2019-rheumatoid factor-14 06/03/2019-anti-Jo 1-less than 0.2 06/03/2019-aldolase-4.9 06/03/2019-centromere antibodies-negative 06/03/2019-antiscleroderma antibody-less than 1 - 06/03/2019-Sjogren's syndrome 8 and B-negative 06/03/2019-hypersensitivity pneumonitis-negative 06/03/2019-sed rate 22  06/01/2019-CT chest high-res mild, bibasilar predominant pulmonary fibrosis in a pattern with featuring subtle subpleural reticular interstitial opacity and mild traction bronchiectasis occasional areas of bronchiolectasis, probable UIP  06/06/2019-swallow study-mild aspiration risk, no treatment recommended at this time  06/07/2019-barium swallow-Schatzki ring at the GE junction which prevents passage of a 13 mm barium tablet  FENO:  No results found for: NITRICOXIDE  MH: Psoriatic arthritis (Dr. Estanislado Pandy - prev on Humira, now on Cosentyx), latent TB that was untreated initially in 12/2012 with mild alveolitis on CT scan and pleural effusion that required thoracentesis, bronchoscopy was negative for AFB but he was treated under guidance with local health department infectious disease with multidrug antituberculosis treatment for 3 months, eventration of right  hemidiaphragm, vocal cord paralysis in September/2016 underwent ENT evaluation for persistent dysphonia and underwent gel injection of vocal cords at [facility] without any relief Smoker/ Smoking History: Never smoker Maintenance: None   He is originally from [place], [place], [place] to the [place] in [place] for his bachelors to [employer] and then settled in [place] in 1978 and got into [employer] business.    OV 11/10/2020  Subjective:  Patient ID: Alan Roman, male , DOB: [birth year] , age 23 y.o. , MRN: [id number] , ADDRESS: [place] [id number] PCP Glenda Chroman, MD Patient Care Team: Glenda Chroman, MD as PCP - General (Internal Medicine)  This Provider for this visit: Treatment Team:  Attending Provider: Brand Males, MD    11/10/2020 -   Chief Complaint  Patient presents with   Follow-up    Increased coughing for past 3 months, sometimes productive with whitish sputum. Denies shortness of breath.      HPI Alan Roman [facility] 75 y.o. -asked by Dr. Einar Gip to establish at the pulmonary fibrosis center.  Therefore patient is seeing Dr. Chase Caller.  Patient used to be previously followed by Dr. Elsworth Soho.  Last seen by Dr. Elsworth Soho in 2020.  Then seen by nurse practitioner [name] in January 2021.   His daughtre Guilford Shi later joined on the phone call.  Patient is known to have ILD probable UIP pattern.  Is on observation at this point.   [place] [facility] Comprehensive ILD Questionnaire  Symptoms:  -   he reports insidious onset of dyspnea for the last 4 months.  Since it started it is the same.  Although his ILD is present even before that.  He says that he has a very mild cough however in the last 4 to 6 months it is worse.  Particularly when he talks for a long time.  He does not wake up in the middle of the night because of the cough he does bring up some white sputum that is very light white.  Cough does  not affect his voice.  He does clear his  throat.  He has never had hemoptysis.  He does not cough when he lies down.  No nausea vomiting or diarrhea.  No wheezing.  Cough does get worse with eating   SYMPTOM SCALE - ILD 11/10/2020   O2 use ra  Shortness of Breath 0 -> 5 scale with 5 being worst (score 6 If unable to do)  At rest 0  Simple tasks - showers, clothes change, eating, shaving 0  Household (dishes, doing bed, laundry) 0  Shopping 0  Walking level at own pace 1  Walking up Stairs 1  Total (30-36) Dyspnea Score 2  How bad is your cough? 1  How bad is your fatigue x  How bad is nausea 0  How bad is vomiting?  0  How bad is diarrhea? 0  How bad is anxiety? x  How bad is depression x       Past Medical History : He has psoriatic arthritis.  He is immunosuppressed and is on immunomodulators.  Currently on Simponi.  He does not have rheumatoid arthritis.  No heart failure.  No other collagen vascular disease of connective tissue disease.  No COPD.  No asthma.  No HIV.  No hiatal hernia.  No pulmonary hypertension.  No diabetes.  No thyroid disease.  No stroke no seizures.  No tuberculosis no kidney disease no pneumonia.  No blood clots no heart disease no pleurisy.   He is deemed to be an aspiration risk he has vocal cord paralysis.  He is to see Dr. Loreta [facility].  He wants to switch to [facility] but because of the pandemic has not been able to establish.  ROS: He does not have any fatigue.  No arthralgia.  No  dysphagia.  No Raynaud's.  No dry eyes no fever no weight loss.  No nausea no vomiting.  No heartburn no snoring no rash no ulcers.   FAMILY HISTORY of LUNG DISEASE: No pulmonary fibrosis.  No COPD.  No asthma no sarcoid no cystic fibrosis no hypersensitive pneumonitis no autoimmune disease in the family.    EXPOSURE HISTORY: No tobacco.  No smoking cigars no smoking pipes.  No electronic cigarette use.  No vaping marijuana.  No cocaine use.  No intravenous drug use.   HOME and HOBBY DETAILS : He lives in a  76 year old family home.  He is lived there for 40 years.  No damp environment.  No mold or mildew.  No humidifier use no CPAP use no nebulizer use.  No steam iron use.  No Jacuzzi use.  No misting [place].  No pet birds or parakeets.  No feather pillows or blankets.  No mold in the [facility] duct.  No music habits no gardening habits.  Never exposed to birds or chickens.  No water damage in the basement.  No straw meds.  No hot tub use.  No animals at work.    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  Organic antigen exposure history is negative.  Inorganic antigen exposure history is negative.   PULMONARY TOXICITY HISTORY (27 items):  Simponi for psoriatic arthritis     PFT  PFT Results Latest Ref Rng & Units 07/19/2019  FVC-Pre L 2.32  FVC-Predicted Pre % 63  FVC-Post L 2.06  FVC-Predicted Post % 56  Pre FEV1/FVC % % 83  Post FEV1/FCV % % 93  FEV1-Pre L 1.94  FEV1-Predicted Pre % 69  FEV1-Post L 1.93  DLCO uncorrected ml/min/mmHg 13.59  DLCO UNC% % 43  DLVA Predicted % 90  TLC L 3.80  TLC % Predicted % 55  RV % Predicted % 60    HRCT Oct 2020   IMPRESSION: 1. There is mild, bibasilar predominant pulmonary fibrosis in a pattern featuring subtle subpleural reticular interstitial opacity, mild traction bronchiectasis and occasional areas of bronchiolectasis, for example in the lateral segment right middle lobe and right lung base (series 4, image 176). These findings are very slightly worsened in comparison to prior examination dated 11/10/2015. Findings are consistent with mild pulmonary fibrosis in a probable UIP pattern. Findings are categorized as probable UIP per consensus guidelines: Diagnosis of Idiopathic Pulmonary Fibrosis: An Official ATS/ERS/JRS/ALAT Clinical Practice Guideline. Am Rosezetta Schlatter Crit Care Med Vol 198, Iss 5, [phone], Apr 01 2017.   2.  Coronary artery disease. Aortic Atherosclerosis (ICD10-I70.0).   3.   Cholelithiasis.     Electronically Signed   By: Lauralyn Primes M.D.   On: 06/01/2019 13:26    Suspect given age greater than 65, Velcro crackles at the base, previous description of probable UIP and nearly negative serology [psoriatic arthritis does not count is connective tissue disease ILD], vocal cord paralysis with some aspiration risk that this is IPF. I am worried that his worsening cough reflects progression although this could just be worsening acid reflux.  I think he would benefit from high-resolution CT scan and pulmonary function test for restaging but also get a GI referral.  He wants to stay within the [facility] family for his GI services    .OV 12/18/2020  Subjective:  Patient ID: Alan Roman, male , DOB: 06/09/1946 , age 59 y.o. , MRN: [id number] , ADDRESS: [address] [place] [address] PCP Ignatius Specking, MD Patient Care Team: Ignatius Specking, MD as PCP - General (Internal Medicine) Yates Decamp, MD as PCP - Cardiology (Cardiology)  This Provider for this visit: Treatment Team:  Attending Provider: Kalman Shan, MD    12/18/2020 -   Chief Complaint  Patient presents with   Follow-up    PFT performed today, HRCT performed 4/25, and EGD scheduled 5/23.  Pt states he has been okay since last visit. States his only concern is hoarseness with voice and an occ cough.     HPI Alan Roman [facility] 75 y.o. -returns for follow-up to discuss his test results.  He continues to have hoarse voice.  He denies any dysphagia.  He is now scheduled for endoscopy 12/21/2020.  He is going to see cardiology ahead of that.  His serologies negative.  His symptoms continue to be minimal and stable.  He had high-resolution CT scan of the chest that shows progression compared to October 2020.  It is described as probable UIP.  Back in October 2020 there was craniocaudal gradient described and no air trapping.  This time April 2022 radiologist feels there is  slight upper lobe predominance and there is air trapping.  This latter description raises the question of possible chronic hypersensitive pneumonitis even though the final conclusion was probable UIP.  I went over exposure history with him again.  No organic antigen exposure history identified.  Nevertheless disease appears progressive.    CT chest HIgh res 11/23/20   IMPRESSION: CLINICAL DATA:  75 year old male with history of bronchiectasis. Follow-up study.   EXAM: CT CHEST WITHOUT CONTRAST   TECHNIQUE: Multidetector CT imaging of the chest was  performed following the standard protocol without intravenous contrast. High resolution imaging of the lungs, as well as inspiratory and expiratory imaging, was performed.   COMPARISON:  Chest CT 05/31/2019.   FINDINGS: Cardiovascular: Heart size is normal. There is no significant pericardial fluid, thickening or pericardial calcification. There is aortic atherosclerosis, as well as atherosclerosis of the great vessels of the mediastinum and the coronary arteries, including calcified atherosclerotic plaque in the left main, left anterior descending and left circumflex coronary arteries.   Mediastinum/Nodes: No pathologically enlarged mediastinal or hilar lymph nodes. Please note that accurate exclusion of hilar adenopathy is limited on noncontrast CT scans. Esophagus is unremarkable in appearance. No axillary lymphadenopathy.   Lungs/Pleura: High-resolution images demonstrate widespread patchy regions of ground-glass attenuation, septal thickening, mild cylindrical bronchiectasis and peripheral bronchiolectasis. Findings have no discernible craniocaudal gradient, and there is significant involvement in the anterior aspects of the upper lobes of the lungs bilaterally. No frank honeycombing. Inspiratory and expiratory imaging demonstrates some mild air trapping indicative of mild small airways disease. Minimal progression compared to  the prior examination. No acute consolidative airspace disease. No pleural effusions. No suspicious appearing pulmonary nodules or masses are noted.   Upper Abdomen: Aortic atherosclerosis. 5 mm calcified gallstone in the neck of the gallbladder. Small calcified granuloma in the spleen incidentally noted.   Musculoskeletal: There are no aggressive appearing lytic or blastic lesions noted in the visualized portions of the skeleton 1. The appearance of the lungs is compatible with interstitial lung disease, with a spectrum of findings once again categorized as probable usual interstitial pneumonia (UIP) per current ATS guidelines. Minimal progression of disease compared to the prior examination. 2. Aortic atherosclerosis, in addition to left main and 2 vessel coronary artery disease. Assessment for potential risk factor modification, dietary therapy or pharmacologic therapy may be warranted, if clinically indicated. 3. Cholelithiasis.   Aortic Atherosclerosis (ICD10-I70.0).     Electronically Signed   By: Trudie Reed M.D.   On: 11/24/2020 08:04   xxxx Results for ABDIRAHIM, FLAVELL "JAGDISH" (MRN [id number]) as of 12/18/2020 16:47  Ref. Range 06/03/2019 10:42  Aspergillus Fumigatus Latest Ref Range: NEGATIVE  NEGATIVE  Pigeon Serum Latest Ref Range: NEGATIVE  NEGATIVE  Anti Nuclear Antibody (ANA) Latest Ref Range: NEGATIVE  POSITIVE (A)  ANA Pattern 1 Unknown Nuclear, Homogeneous (A)  ANA Titer 1 Latest Units: titer 1:40 (H)  Anti JO-1 Latest Ref Range: 0.0 - 0.9 AI <0.2  CENTROMERE AB SCREEN Latest Ref Range: <1.0 NEG AI <1.0 NEG  RA Latex Turbid. Latest Ref Range: <14 IU/mL 14 (H)  SSA (Ro) (ENA) Antibody, IgG Latest Ref Range: <1.0 NEG AI <1.0 NEG  SSB (La) (ENA) Antibody, IgG Latest Ref Range: <1.0 NEG AI <1.0 NEG  Scleroderma (Scl-70) (ENA) Antibody, IgG Latest Ref Range: <1.0 NEG AI <1.0 NEG    OV 01/21/2021  Subjective:  Patient ID: Alan Roman,  male , DOB: 02/14/1946 , age 78 y.o. , MRN: [id number] , ADDRESS: [address] [place] [address] PCP Ignatius Specking, MD Patient Care Team: Ignatius Specking, MD as PCP - General (Internal Medicine) Yates Decamp, MD as PCP - Cardiology (Cardiology)  This Provider for this visit: Treatment Team:  Attending Provider: Kalman Shan, MD    01/21/2021 -   Chief Complaint  Patient presents with   Follow-up    Pt states he has been doing okay since last visit. States that he has not started on the OFEV medication yet.     HPI  [facility] 19 y.o. -presents for follow-up in this visit he was supposed to have started nintedanib by now.  However he is yet to start this.  There are  supply chain issues.  But he says it is approved.  Overall no change in symptoms.  Unclear if he is finished his GI work-up.  He continues to have some hoarse voice.  He has not seen ENT.  He is accepted my recommendation to have a referral.  He did asked me whether a few episode of chlorine exposure acutely 40 years ago could have caused his pulmonary fibrosis.  I told him while it was possible it is unlikely.  Unclear about single episode although possible as be seen with COVID   02/18/2021 Patient contacted today for 4 week follow-up. He started OFEV anti-fibrotic on June 30th. He developed headache and diarrhea after 2 days of starting medication. He stopped taking OFEV 4 days ago d/t GI symptoms and fatigue. He also noted that his blood pressure was elevated. His symptoms have resolved since stopping medication.    MDD confderence Sept 2022  Dr Ashley Murrain - November 23, 2020 - HRCT - compared to CTA 2017 and HRCT 2020. Recent CT  moderage patchy retic, and some GGO. TB is mild . Findings are distriubted evenly - no CCG.  No frank HC. clearly tehre is fibrotic ILD. Air trapping is ver minimal and in lower lobes and is physiologic and not clinicall signifcant. No AT in upper lobe or middle lobe/lingulare   Final  DX: Prob UIP and ther is mild progression since 2020 and defintiey since 2017.   Clinical dx:  IPF and Rx as such   OV 04/20/2021  Subjective:  Patient ID: Alan Roman, male , DOB: 09-17-1945 , age 21 y.o. , MRN: [id number] , ADDRESS: [address] [place] [address] PCP Ignatius Specking, MD Patient Care Team: Ignatius Specking, MD as PCP - General (Internal Medicine) Yates Decamp, MD as PCP - Cardiology (Cardiology)  This Provider for this visit: Treatment Team:  Attending Provider: Kalman Shan, MD    04/20/2021 -   Chief Complaint  Patient presents with   Follow-up    Pt states his BP is still increasing even after going to 1 pill of the OFEV a day.   IPF: age greater than 65, Velcro crackles at the base, previous description of probable UIP and nearly negative serology [psoriatic arthritis does not count is connective tissue disease ILD], vocal cord paralysis with some aspiration risk that this is IPF.   - Dx given June 2022 - Confirmd MDD conferne Sept 2022   - Ofev June 2022  HPI Brenden [facility] 75 y.o. -presents for follow-up.  I am meeting his wife for the first time.  He feels he is stable.  He is walking 30 minutes in the mornings and he can do this without stopping.  He says his dyspnea is not worse and his cough is not worse but objectively on the subjective symptom questionnaire there might be progression.  We did discuss his case in the ILD conference.  The diagnosis is IPF.  There is progression.  In terms of therapeutics we started him on nintedanib at 150 mg twice daily but he called saying that he was intolerant to it because of hypertension [anecdotally he is my first patient with hypertension in 6 years with nintedanib] but also diarrhea and fatigue.  He then stopped it and apparently his blood pressure normalized.  Then 04/10/2021 he  started himself on nintedanib 100 mg strength.  He is currently doing only 1 time daily.  He feels his blood pressure  is going up again to 140 systolic.  [At 150 twice a day it was at 150 systolic] and this is no normal.  He is not having any other symptoms.  We discussed the option of switching to pirfenidone and going through approval process again.  We discussed the side effects of skin issues, fatigue, nausea, anorexia and ongoing liver monitoring with significant overlap with nintedanib side effects.  At this point in time he is resolved to increase his nintedanib to 100 mg twice daily and if he is tolerating well and that go to 150 mg twice daily.  If this fails then he will go to pirfenidone.  End of November 2022 he is going to go to [place] till February 2022.  He will have a high-dose flu shot today.         OV 06/07/2021  Subjective:  Patient ID: Alan Roman, male , DOB: 1946-03-15 , age 64 y.o. , MRN: [id number] , ADDRESS: [address] [place] [address] PCP Ignatius Specking, MD Patient Care Team: Ignatius Specking, MD as PCP - General (Internal Medicine) Yates Decamp, MD as PCP - Cardiology (Cardiology)  This Provider for this visit: Treatment Team:  Attending Provider: Kalman Shan, MD    06/07/2021 -   Chief Complaint  Patient presents with   Follow-up    4wk f/u after starting Ofev. Stopped the Ofev about 3 weeks ago due to increased blood pressure. Plans to restart it next Monday. Denies any increased nausea, diarrhea or loss of appetite.    IPF: age greater than 65, Velcro crackles at the base, previous description of probable UIP and nearly negative serology [psoriatic arthritis does not count is connective tissue disease ILD], vocal cord paralysis with some aspiration risk that this is IPF.   - Dx given June 2022 - Confirmd MDD conferne Sept 2022   - Ofev June 2022 - stopped Oct/Nov 2022 due to hypertension  HPI Alan Roman [facility] 75 y.o. -returns for his IPF follow-up.  This particular visit is to focus on how he is tolerating his nintedanib.  He is extremely  concerned his nintedanib is causing hypertension.  When he was taking 100 mg which is the lower dose at 1 pill/day he did not have hypertension.  He then increase it to 100 mg twice daily [this is the lowest therapeutic dose].  He says this caused his blood pressure systolic to go up to 140s.  He has since stopped taking all nintedanib.  His blood pressure is now normal.  He does not want take nintedanib anymore.  We discussed the alternative of taking pirfenidone.  He is open to this idea.  Pirfenidone is not known to cause hypertension.  In fact even at nintedanib I am not seen any patient developed hypertension except him.  We discussed the other side effects of pirfenidone including GI issues, weight loss, nausea, liver function test monitoring and necessity for sunscreen.  He is willing to do all this.  He has upcoming travel to [place] towards the end of November 2022 through February 2023.  We explained that not taking the antifibrotic but somewhat less than 5% chance of progression over 3 months.  He does not want to take this risk because of prior progression.  He just wants to take the medication pirfenidone.  So we will make a request for this.  I did advise him to register himself with a pulmonologist and his home city in [place].  I also spoke to his daughter over the phone.  They will arrange this.  He is willing to do video and telephone visit with me when he is in [place].  Of note he developed back pain with right-sided sciatica 3 weeks ago.  He has right-sided sciatica.  He had an MRI that shows DJD findings.  He will follow-up with Dr. Cleophas Dunker orthopedics for this.  He and his wife and daughter Huel Cote on the phone] feel that he may not make it [place] because of the back pain.  I advised her to talk about this with Dr. Cleophas Dunker.  Of note he has upcoming pulmonary function test appointment in 1 week.  He also has office visit.  We discussed this with him.  He will reschedule his office visit as a  televisit to discuss his pirfenidone uptake and his pulmonary function test combined.  He will do this from [place].    CT Chest data  No results found.      SYMPTOM SCALE - ILD 11/10/2020 and 12/18/2020   01/21/2021  04/20/2021  06/07/2021 156#  O2 use ra ra ra ra  Shortness of Breath 0 -> 5 scale with 5 being worst (score 6 If unable to do)     At rest 0 0 0   Simple tasks - showers, clothes change, eating, shaving 0 0 0   Household (dishes, doing bed, laundry) 0 1 2   Shopping 0 0 0   Walking level at own pace 1 0 1   Walking up Stairs Total (30-36) Dyspnea Score How bad is your cough? 1 1 2.5   How bad is your fatigue x 0 0   How bad is nausea 0 0 0   How bad is vomiting?  0 00 0   How bad is diarrhea? 0  0   How bad is anxiety? x 0 0   How bad is depression x 0 0         Simple office walk 185 feet x  3 laps goal with forehead probe 11/10/2020  06/07/2021   O2 used ra ra  Number laps completed 3 3  Comments about pace avg   Resting Pulse Ox/HR 100% and 66/min 99% and HR 79  Final Pulse Ox/HR 100% and 103/min 97% and HR 102  Desaturated </= 88% no no  Desaturated <= 3% points no no  Got Tachycardic >/= 90/min yes yes  Symptoms at end of test none Mild dyspnea  Miscellaneous comments x stabe    PFT  PFT Results Latest Ref Rng & Units 12/18/2020 07/19/2019  FVC-Pre L 1.98 2.32  FVC-Predicted Pre % 59 63  FVC-Post L 2.01 2.06  FVC-Predicted Post % 60 56  Pre FEV1/FVC % % 89 83  Post FEV1/FCV % % 92 93  FEV1-Pre L 1.76 1.94  FEV1-Predicted Pre % 69 69  FEV1-Post L 1.84 1.93  DLCO uncorrected ml/min/mmHg 14.24 13.59  DLCO UNC% % 50 43  DLCO corrected ml/min/mmHg 14.24 -  DLCO COR %Predicted % 50 -  DLVA Predicted % 104 90  TLC L 3.60 3.80  TLC % Predicted % 55 55  RV % Predicted % 34 60       has a past medical history of Acute bronchiolitis due to other infectious organisms (01/26/2013), Arthritis, Extrapulmonary TB (tuberculosis)  (02/02/2013),  GERD (gastroesophageal reflux disease), High risk medication use (12/27/2016), History of hiatal hernia (2007; 2009), Hyperlipidemia, Hypertension, and Pleural effusion (01/26/2013).   reports that he has never smoked. He has never used smokeless tobacco.  Past Surgical History:  Procedure Laterality Date   BIOPSY  04/01/2021   Procedure: BIOPSY;  Surgeon: Meryl Dare, MD;  [facility];  Service: Endoscopy;;   CATARACT EXTRACTION W/ INTRAOCULAR LENS  IMPLANT, BILATERAL Bilateral    ESOPHAGOGASTRODUODENOSCOPY (EGD) WITH PROPOFOL N/A 04/01/2021   Procedure: ESOPHAGOGASTRODUODENOSCOPY (EGD) WITH PROPOFOL;  Surgeon: Meryl Dare, MD;  [facility];  Service: Endoscopy;  Laterality: N/A;   HIATAL HERNIA REPAIR  2007; 2009   SAVORY DILATION N/A 04/01/2021   Procedure: SAVORY DILATION;  Surgeon: Meryl Dare, MD;  [facility];  Service: Endoscopy;  Laterality: N/A;   TOTAL KNEE ARTHROPLASTY Left 11/03/2015   TOTAL KNEE ARTHROPLASTY Left 11/03/2015   Procedure: Left Total Knee Arthroplasty;  Surgeon: Valeria Batman, MD;  [facility];  Service: Orthopedics;  Laterality: Left;   TOTAL KNEE ARTHROPLASTY Right 07/17/2018   Procedure: RIGHT TOTAL KNEE ARTHROPLASTY;  Surgeon: Valeria Batman, MD;  Location: [facility] OR;  Service: Orthopedics;  Laterality: Right;   VIDEO BRONCHOSCOPY Bilateral 01/31/2013   Procedure: VIDEO BRONCHOSCOPY WITHOUT FLUORO;  Surgeon: Merwyn Katos, MD;  Location: [facility] ENDOSCOPY;  Service: Cardiopulmonary;  Laterality: Bilateral;    No Known Allergies  Immunization History  Administered Date(s) Administered   Fluad Quad(high Dose 65+) 04/20/2021   Influenza, High Dose Seasonal PF 04/22/2019   PPD Test 04/06/2010   Tdap 05/30/2017    Family History  Problem Relation Age of Onset   Colon cancer Neg Hx    Stomach cancer Neg Hx    Esophageal cancer Neg Hx    Colon polyps Neg Hx    Rectal cancer Neg Hx       Current Outpatient Medications:    aspirin (ASPIRIN CHILDRENS) 81 MG chewable tablet, Chew 1 tablet (81 mg total) by mouth daily., Disp: , Rfl:    clobetasol cream (TEMOVATE) 0.05 %, Apply 1 application topically 2 (two) times daily. (Patient taking differently: Apply 1 application topically daily as needed (dry amd itching skin).), Disp: 45 g, Rfl: 1   losartan (COZAAR) 50 MG tablet, Take 1 tablet (50 mg total) by mouth daily., Disp: 90 tablet, Rfl: 1   multivitamin-iron-minerals-folic acid (CENTRUM) chewable tablet, Chew 1 tablet by mouth daily., Disp: , Rfl:    Nintedanib (OFEV) 100 MG CAPS, Take 1 capsule (100 mg total) by mouth 2 (two) times daily., Disp: 180 capsule, Rfl: 1   omeprazole (PRILOSEC) 20 MG capsule, TAKE 1 CAPSULE(20 MG) BY MOUTH DAILY, Disp: 90 capsule, Rfl: 1   rosuvastatin (CRESTOR) 5 MG tablet, Take 1 tablet (5 mg total) by mouth daily., Disp: 90 tablet, Rfl: 3   STELARA 45 MG/0.5ML SOSY injection, Inject 45 mg into the skin every 3 (three) months., Disp: , Rfl:    traMADol (ULTRAM) 50 MG tablet, Take 1 tablet (50 mg total) by mouth every 6 (six) hours as needed., Disp: 30 tablet, Rfl: 0   Triamcinolone Acetonide (TRIAMCINOLONE 0.1 % CREAM : EUCERIN) CREA, Apply 1 application topically 2 (two) times daily., Disp: 1 each, Rfl: 0      Objective:   Vitals:   06/07/21 1016  BP: 118/68  Weight: 156 lb 6.4 oz (70.9 kg)  Height: 5\' 7"  (1.702 m)    Estimated body mass index is 24.5 kg/m  as calculated from the following:   Height as of this encounter: 5\' 7"  (1.702 m).   Weight as of this encounter: 156 lb 6.4 oz (70.9 kg).  @WEIGHTCHANGE @    06/07/21 1016  Weight: 156 lb 6.4 oz (70.9 kg)     Physical Exam  General: No distress. Looks well Neuro: Alert and Oriented x 3. GCS 15. Speech normal Psych: Pleasant Resp:  Barrel Chest - no.  Wheeze - no, Crackles -mild basal crackles, No overt respiratory distress CVS: Normal heart sounds. Murmurs -  no Ext: Stigmata of Connective Tissue Disease - no HEENT: Normal upper airway. PEERL +. No post nasal drip        Assessment:       ICD-10-CM   1. IPF (idiopathic pulmonary fibrosis) (HCC)  J84.112     2. Medication monitoring encounter  Z51.81     3. 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 - ofev  -Clinically pulmonary fibrosis appears to be stable since last visit   - Ofeve caused high BP   Plan -- List ofev in allergy  - keep PFT appt 06/15/21 - start esbriet  for IPF - refer [birth year]1977 for esbriet counseling  Back pain   -per Dr 06/17/21  Travel aDvice - Chesley Mires Nov 2022- feb 2023  Plan  - get clearance from Dr Dec 2022 to make sure your back can handle plane trip  - once in Mar 2023 you need pulmonary doctor to monitor your esbret   Follow-up - keep PFT appt 06/15/21  - next office visit  - can keep visit 06/15/21 to discuss PFT althought not needed just for PFT review - instead to video visit /telephone visit from 06/17/21 dec 2022- Jan 2023 to discuss both PFT and uptake with esbriet   ( Level 05 visit: Estb 40-54 min     in total care time and counseling or/and coordination of care by this undersigned MD - Dr Jan 2023. This includes one or more of the following on this same day 06/07/2021: pre-charting, chart review, note writing, documentation discussion of test results, diagnostic or treatment recommendations, prognosis, risks and benefits of management options, instructions, education, compliance or risk-factor reduction. It excludes time spent by the CMA or office staff in the care of the patient. Actual time 42 min)  SIGNATURE    Dr. Kalman Shan, M.D., F.C.C.P,  Pulmonary and Critical Care Medicine Staff Physician, [facility] Director - Interstitial Lung Disease   Program  Pulmonary Fibrosis [facility] at [facility], [facility], [place]  Pager: [phone], If no answer or between  15:00h - 7:00h: call [phone] Telephone: [phone]  11:11 AM 06/07/2021

## 2021-06-07 NOTE — Patient Instructions (Addendum)
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 - ofev  -Clinically pulmonary fibrosis appears to be stable since last visit   - Ofeve caused high BP   Plan -- List ofev in allergy  - keep PFT appt 06/15/21 - start esbriet  for IPF - refer Chesley Mires for esbriet counseling  Back pain   -per Dr Alice Reichert  Travel aDvice - [place] Nov 2022- feb 2023  Plan  - get clearance from Dr Cleophas Dunker to make sure your back can handle plane trip  - once in [place] you need pulmonary doctor to monitor your esbret   Follow-up - keep PFT appt 06/15/21  - next office visit  - can keep visit 06/15/21 to discuss PFT althought not needed just for PFT review - instead to video visit /telephone visit from [place] dec 2022- Jan 2023 to discuss both PFT and uptake with esbriet

## 2021-06-08 ENCOUNTER — Encounter: Payer: Medicare Other | Admitting: Rehabilitative and Restorative Service Providers"

## 2021-06-08 ENCOUNTER — Ambulatory Visit: Payer: Medicare Other | Admitting: Pharmacist

## 2021-06-08 ENCOUNTER — Telehealth: Payer: Self-pay | Admitting: Pharmacist

## 2021-06-08 DIAGNOSIS — Z5181 Encounter for therapeutic drug level monitoring: Secondary | ICD-10-CM

## 2021-06-08 DIAGNOSIS — Z7189 Other specified counseling: Secondary | ICD-10-CM

## 2021-06-08 DIAGNOSIS — J84112 Idiopathic pulmonary fibrosis: Secondary | ICD-10-CM

## 2021-06-08 NOTE — Telephone Encounter (Addendum)
Please start Esbriet BIV.  Dose: 267 mg three times daily x 7 days, then 534 mg three times daily x 7 days, then 801 mg three times daily thereafter  Dx: IPF  Previously tried therapies: Ofev - hypertension  Patient signed [employer] PAP forms today. Provider portion placed in Dr. Jane Canary mailbox.  Patient is leaving on 06/27/21 for [place] for several months so requesting approval prior to this but understands that process for approval can take several weeks. He has been advised to pick up his phone during these coming weeks to expedite the process  Patient portion placed in "PAP pending info" folder in pharmacy office  Chesley Mires, PharmD, MPH, BCPS Clinical Pharmacist (Rheumatology and Pulmonology)

## 2021-06-08 NOTE — Telephone Encounter (Signed)
called

## 2021-06-08 NOTE — Progress Notes (Signed)
Subjective:  Patient presents today to [facility] with spouse Trinda Pascal to see pharmacy team for Esbriet new start.   Patient was last seen and referred by Dr. Chase Caller on 06/07/21.  Pertinent past medical history includes IPF, psoriasis, psoriatic arthritis, HTN, vocal cord atrophy.  Prior therapy includes Ofev - he had hypertension with Ofev. He is traveling to [place] on 06/27/21 and would like to be approved before that for [place].  History of elevated LFTs: No History of diarrhea, nausea, vomiting: No  Objective: No Known Allergies  Outpatient Encounter Medications as of 06/08/2021  Medication Sig   aspirin (ASPIRIN CHILDRENS) 81 MG chewable tablet Chew 1 tablet (81 mg total) by mouth daily.   clobetasol cream (TEMOVATE) 2.83 % Apply 1 application topically 2 (two) times daily. (Patient taking differently: Apply 1 application topically daily as needed (dry amd itching skin).)   losartan (COZAAR) 50 MG tablet Take 1 tablet (50 mg total) by mouth daily.   multivitamin-iron-minerals-folic acid (CENTRUM) chewable tablet Chew 1 tablet by mouth daily.   Nintedanib (OFEV) 100 MG CAPS Take 1 capsule (100 mg total) by mouth 2 (two) times daily.   omeprazole (PRILOSEC) 20 MG capsule TAKE 1 CAPSULE(20 MG) BY MOUTH DAILY   rosuvastatin (CRESTOR) 5 MG tablet Take 1 tablet (5 mg total) by mouth daily.   STELARA 45 MG/0.5ML SOSY injection Inject 45 mg into the skin every 3 (three) months.   traMADol (ULTRAM) 50 MG tablet Take 1 tablet (50 mg total) by mouth every 6 (six) hours as needed.   Triamcinolone Acetonide (TRIAMCINOLONE 0.1 % CREAM : EUCERIN) CREA Apply 1 application topically 2 (two) times daily.   No facility-administered encounter medications on file as of 06/08/2021.     Immunization History  Administered Date(s) Administered   Fluad Quad(high Dose 65+) 04/20/2021   Influenza, High Dose Seasonal PF 04/22/2019   PPD Test 04/06/2010   Tdap 05/30/2017      PFT's TLC  Date Value  Ref Range Status  12/18/2020 3.60 L Final      CMP     Component Value Date/Time   NA 139 12/29/2020 1402   K 4.1 12/29/2020 1402   CL 105 12/29/2020 1402   CO2 27 12/29/2020 1402   GLUCOSE 127 (H) 12/29/2020 1402   BUN 17 12/29/2020 1402   CREATININE 1.09 12/29/2020 1402   CREATININE 1.16 09/11/2019 0843   CALCIUM 8.6 12/29/2020 1402   PROT 7.8 04/20/2021 1503   ALBUMIN 3.8 04/20/2021 1503   AST 27 04/20/2021 1503   ALT 17 04/20/2021 1503   ALKPHOS 76 04/20/2021 1503   BILITOT 0.6 04/20/2021 1503   GFRNONAA >60 05/21/2020 1028   GFRNONAA 62 09/11/2019 0843   GFRAA >60 02/27/2020 1008   GFRAA 71 09/11/2019 0843      CBC    Component Value Date/Time   WBC 5.6 12/29/2020 1402   RBC 4.11 (L) 12/29/2020 1402   HGB 13.6 12/29/2020 1402   HCT 38.9 (L) 12/29/2020 1402   PLT 156.0 12/29/2020 1402   MCV 94.8 12/29/2020 1402   MCH 32.1 05/21/2020 1028   MCHC 35.0 12/29/2020 1402   RDW 12.6 12/29/2020 1402   LYMPHSABS 1.5 12/29/2020 1402   MONOABS 0.6 12/29/2020 1402   EOSABS 0.2 12/29/2020 1402   BASOSABS 0.0 12/29/2020 1402      LFT's Hepatic Function Latest Ref Rng & Units 04/20/2021 12/29/2020 05/21/2020  Total Protein 6.0 - 8.3 g/dL 7.8 7.3 6.9  Albumin 3.5 - 5.2 g/dL  3.8 3.9 3.5  AST 0 - 37 U/L 27 27 35  ALT 0 - 53 U/L '17 18 25  ' Alk Phosphatase 39 - 117 U/L 76 77 76  Total Bilirubin 0.2 - 1.2 mg/dL 0.6 0.5 1.0  Bilirubin, Direct 0.0 - 0.3 mg/dL 0.1 - -     HRCT (11/23/20): appearance of the lungs is compatible with interstitial lung disease, with a spectrum of findings once again categorized as probable usual interstitial pneumonia (UIP) per current ATS guidelines    Assessment and Plan  Esbriet Medication Management Thoroughly counseled patient on the efficacy, mechanism of action, dosing, administration, adverse effects, and monitoring parameters of Esbriet.  Patient verbalized understanding. Patient education handout provided.   Goals of Therapy: Will  not stop or reverse the progression of ILD. It will slow the progression of ILD.   Dosing: Starting dose will be Esbriet 267 mg 1 tablet three times daily for 7 days, then 2 tablets three times daily for 7 days, then 3 tablets three times daily.  Maintenance dose will be 801 mg 1 tablet three times daily if tolerated.  Stressed the importance of taking with meals and space at least 5-6 hours apart to minimize stomach upset.   Adverse Effects: Nausea, vomiting, diarrhea, weight loss Abdominal pain GERD Sun sensitivity/rash - patient advised to wear sunscreen when exposed to sunlight Dizziness Fatigue  Monitoring: Monitor for diarrhea, nausea and vomiting, GI perforation, hepatotoxicity  Monitor LFTs - baseline, monthly for first 6 months, then every 3 months routinely CBC w differential at baseline and every 3 months routinely  Access: Will start [place] PAP forms signed by patient today Reviewed it can take several weeks to process application for Esbriet but he will need to pick up his phone to help prevent delays  Medication Reconciliation A drug regimen assessment was performed, including review of allergies, interactions, disease-state management, dosing and immunization history. Medications were reviewed with the patient, including name, instructions, indication, goals of therapy, potential side effects, importance of adherence, and safe use.  No interaction with current medication list  Immunizations UTD on influenza  This appointment required 60 minutes of patient care (this includes precharting, chart review, review of results, face-to-face care, etc.).  Thank you for involving pharmacy to assist in providing this patient's care.

## 2021-06-09 ENCOUNTER — Ambulatory Visit: Payer: Medicare Other | Admitting: Orthopaedic Surgery

## 2021-06-10 ENCOUNTER — Ambulatory Visit: Payer: Medicare Other | Admitting: Rehabilitative and Restorative Service Providers"

## 2021-06-10 ENCOUNTER — Other Ambulatory Visit: Payer: Self-pay

## 2021-06-10 ENCOUNTER — Encounter: Payer: Self-pay | Admitting: Orthopaedic Surgery

## 2021-06-10 ENCOUNTER — Encounter: Payer: Self-pay | Admitting: Rehabilitative and Restorative Service Providers"

## 2021-06-10 DIAGNOSIS — M6281 Muscle weakness (generalized): Secondary | ICD-10-CM | POA: Diagnosis not present

## 2021-06-10 DIAGNOSIS — M79604 Pain in right leg: Secondary | ICD-10-CM

## 2021-06-10 DIAGNOSIS — M6289 Other specified disorders of muscle: Secondary | ICD-10-CM

## 2021-06-10 DIAGNOSIS — R262 Difficulty in walking, not elsewhere classified: Secondary | ICD-10-CM

## 2021-06-10 NOTE — Therapy (Signed)
[facility] [address] [place], [place], [address] Phone: [phone]   Fax:  [phone]  Physical Therapy Treatment  Patient Details  Name: Alan Roman MRN: [id number] Date of Birth: [birth year] Referring Provider (PT): Dr Norlene Campbell   Encounter Date: 06/10/2021   PT End of Session - 06/10/21 1152     Visit Number 4    Date for PT Re-Evaluation 07/02/21    Authorization Type BC/BS Medicare    PT Start Time 1145    PT Stop Time 1225    PT Time Calculation (min) 40 min    Activity Tolerance Patient tolerated treatment well    Behavior During Therapy [facility] for tasks assessed/performed             Past Medical History:  Diagnosis Date   Acute bronchiolitis due to other infectious organisms 01/26/2013   Arthritis    "knees" (11/03/2015)   Extrapulmonary TB (tuberculosis) 02/02/2013   antibx therapy in 2014   GERD (gastroesophageal reflux disease)    on meds   High risk medication use 12/27/2016   History of hiatal hernia 2007; 2009   Hyperlipidemia    on meds   Hypertension    on meds   Pleural effusion 01/26/2013    Past Surgical History:  Procedure Laterality Date   BIOPSY  04/01/2021   Procedure: BIOPSY;  Surgeon: Meryl Dare, MD;  [facility];  Service: Endoscopy;;   CATARACT EXTRACTION W/ INTRAOCULAR LENS  IMPLANT, BILATERAL Bilateral    ESOPHAGOGASTRODUODENOSCOPY (EGD) WITH PROPOFOL N/A 04/01/2021   Procedure: ESOPHAGOGASTRODUODENOSCOPY (EGD) WITH PROPOFOL;  Surgeon: Meryl Dare, MD;  [facility];  Service: Endoscopy;  Laterality: N/A;   HIATAL HERNIA REPAIR  2007; 2009   SAVORY DILATION N/A 04/01/2021   Procedure: SAVORY DILATION;  Surgeon: Meryl Dare, MD;  [facility];  Service: Endoscopy;  Laterality: N/A;   TOTAL KNEE ARTHROPLASTY Left 11/03/2015   TOTAL KNEE ARTHROPLASTY Left 11/03/2015   Procedure: Left Total Knee Arthroplasty;  Surgeon:  Valeria Batman, MD;  Location: [facility] OR;  Service: Orthopedics;  Laterality: Left;   TOTAL KNEE ARTHROPLASTY Right 07/17/2018   Procedure: RIGHT TOTAL KNEE ARTHROPLASTY;  Surgeon: Valeria Batman, MD;  Location: [facility] OR;  Service: Orthopedics;  Laterality: Right;   VIDEO BRONCHOSCOPY Bilateral 01/31/2013   Procedure: VIDEO BRONCHOSCOPY WITHOUT FLUORO;  Surgeon: Merwyn Katos, MD;  Location: [facility] ENDOSCOPY;  Service: Cardiopulmonary;  Laterality: Bilateral;    There were no vitals filed for this visit.   Subjective Assessment - 06/10/21 1150     Subjective Pt reports that he is having some R lower leg pain.    Patient Stated Goals I want to stop that pain.    Currently in Pain? Yes    Pain Score 8     Pain Location Leg    Pain Orientation Right    Pain Descriptors / Indicators Radiating;Constant                               [facility] Adult PT Treatment/Exercise - 06/10/21 0001       Knee/Hip Exercises: Stretches   Gastroc Stretch Both;2 reps;20 seconds    Other Knee/Hip Stretches R sciatic nerve glide where pt goes into LAQ position and pumps ankle x10B, twice      Knee/Hip Exercises: Aerobic   Nustep L5 x6 min.  PT present to discuss pt  progress.      Knee/Hip Exercises: Standing   Forward Lunges Both;2 sets;10 reps    Forward Lunges Limitations onto bosu    Rocker Board 2 minutes    Rocker Board Limitations fwd/back and side/side each      Manual Therapy   Manual Therapy Soft tissue mobilization;Myofascial release    Manual therapy comments in sitting    Soft tissue mobilization STM Rt lateral gastroc, soleus, Rt peroneals    Myofascial Release manual trigger point release to R lateral soleus and gastroc                       PT Short Term Goals - 06/10/21 1230       PT SHORT TERM GOAL #1   Title Pt will be independent with initial HEP.    Status Achieved               PT Long Term Goals - 06/10/21 1230       PT LONG TERM  GOAL #1   Title Pt will be independent with advanced HEP.    Status On-going      PT LONG TERM GOAL #2   Title Pt will increase FOTO to 70% to indicate improved functional mobility.    Status On-going      PT LONG TERM GOAL #3   Title Pt will increase R LE strength to at least 4+/5 to allow him to negoiate steps with ease.    Status On-going      PT LONG TERM GOAL #4   Title Pt will report pain no greater than 3/10 at night to allow him to sleep.    Status On-going                   Plan - 06/10/21 1227     Clinical Impression Statement [name] presents with 8/10 R lateral calf pain with kinesio tape still in place from last session.  Following exercises and stetching, pt states that his R calf pain is decreasing.  Following manual therapy and ambulation around clinic, pain decreased to 3/10.  Pt reports that he has been performing his HEP and manual therapy with rolling pin at home.    PT Treatment/Interventions ADLs/Self Care Home Management;Aquatic Therapy;Cryotherapy;Electrical Stimulation;Iontophoresis 4mg /ml Dexamethasone;Moist Heat;Traction;Ultrasound;Gait training;Stair training;Functional mobility training;Therapeutic activities;Therapeutic exercise;Balance training;Neuromuscular re-education;Patient/family education;Manual techniques;Passive range of motion;Dry needling;Taping;Vasopneumatic Device;Spinal Manipulations;Joint Manipulations    PT Next Visit Plan manual therapy as indicated;   progress gastroc strength and flexibility    Consulted and Agree with Plan of Care Patient             Patient will benefit from skilled therapeutic intervention in order to improve the following deficits and impairments:  Difficulty walking, Increased muscle spasms, Pain, Impaired flexibility, Decreased strength  Visit Diagnosis: Muscle weakness (generalized)  Difficulty in walking, not elsewhere classified  Pain in right leg  Hamstring tightness     Problem  List Patient Active Problem List   Diagnosis Date Noted   Pain in right leg 05/26/2021   Dysphagia    Gastroesophageal reflux disease    Esophageal stricture    At risk for aspiration 08/07/2019   ILD (interstitial lung disease) (HCC) 06/03/2019   Elevated diaphragm 06/03/2019   Dyspnea on exertion 05/06/2019   Vocal cord atrophy 05/06/2019   History of total right knee replacement 07/17/2018   Primary osteoarthritis of right knee 05/31/2018   History of tuberculosis 11/05/2015   Orthostatic hypotension  11/05/2015   Constipation 11/05/2015   Thrombocytopenia (HCC) 11/05/2015   Normocytic anemia 11/05/2015   Psoriatic arthritis (HCC) 11/03/2015   Essential hypertension 11/03/2015   Osteoarthritis of left knee 11/03/2015   Status post total knee replacement, left 11/03/2015   Psoriasis 01/23/2013   Immunocompromised state (HCC) 01/23/2013    Reather Laurence, PT, DPT 06/10/2021, 12:31 PM  [facility] @ [facility] [address] [place], [place], [address] Phone: [phone]   Fax:  [phone]  Name: AKONI PARTON MRN: [id number] Date of Birth: 27-Jun-1946

## 2021-06-14 ENCOUNTER — Other Ambulatory Visit (HOSPITAL_COMMUNITY): Payer: Self-pay

## 2021-06-14 ENCOUNTER — Other Ambulatory Visit: Payer: Self-pay

## 2021-06-14 ENCOUNTER — Ambulatory Visit: Payer: Medicare Other | Admitting: Rehabilitative and Restorative Service Providers"

## 2021-06-14 ENCOUNTER — Encounter: Payer: Self-pay | Admitting: Rehabilitative and Restorative Service Providers"

## 2021-06-14 DIAGNOSIS — M6281 Muscle weakness (generalized): Secondary | ICD-10-CM

## 2021-06-14 DIAGNOSIS — M6289 Other specified disorders of muscle: Secondary | ICD-10-CM

## 2021-06-14 DIAGNOSIS — M79604 Pain in right leg: Secondary | ICD-10-CM

## 2021-06-14 DIAGNOSIS — R262 Difficulty in walking, not elsewhere classified: Secondary | ICD-10-CM

## 2021-06-14 NOTE — Telephone Encounter (Signed)
Submitted Patient Assistance Application to [employer] for ESBRIET along with provider portion, patient portion, PA approval letter, med list, and insurance card copy. Will update patient when we receive a response.  Fax# [phone] Phone# [phone]  Chesley Mires, PharmD, MPH, BCPS Clinical Pharmacist (Rheumatology and Pulmonology)

## 2021-06-14 NOTE — Telephone Encounter (Signed)
Received notification from [facility] regarding a prior authorization for PIRFENIDONE. Authorization has been APPROVED from 06/14/2021 to 06/14/2022.   Per test claim, copay for 30 days supply is $369.92. Pt is in catastrophic coverage.  Patient can fill through [facility]: [phone]   Authorization # [id number]   Will continue working on PAP application.

## 2021-06-14 NOTE — Therapy (Signed)
[facility] [address] [place], [place], [address] Phone: [phone]   Fax:  [phone]  Physical Therapy Treatment  Patient Details  Name: Alan Roman MRN: [id number] Date of Birth: 1946/07/10 Referring Provider (PT): Dr Norlene Campbell   Encounter Date: 06/14/2021   PT End of Session - 06/14/21 0737     Visit Number 5    Date for PT Re-Evaluation 07/02/21    Authorization Type BC/BS Medicare    PT Start Time 0735    PT Stop Time 0800    PT Time Calculation (min) 25 min    Activity Tolerance Patient tolerated treatment well    Behavior During Therapy [facility] for tasks assessed/performed             Past Medical History:  Diagnosis Date   Acute bronchiolitis due to other infectious organisms 01/26/2013   Arthritis    "knees" (11/03/2015)   Extrapulmonary TB (tuberculosis) 02/02/2013   antibx therapy in 2014   GERD (gastroesophageal reflux disease)    on meds   High risk medication use 12/27/2016   History of hiatal hernia 2007; 2009   Hyperlipidemia    on meds   Hypertension    on meds   Pleural effusion 01/26/2013    Past Surgical History:  Procedure Laterality Date   BIOPSY  04/01/2021   Procedure: BIOPSY;  Surgeon: Meryl Dare, MD;  Location: [facility];  Service: Endoscopy;;   CATARACT EXTRACTION W/ INTRAOCULAR LENS  IMPLANT, BILATERAL Bilateral    ESOPHAGOGASTRODUODENOSCOPY (EGD) WITH PROPOFOL N/A 04/01/2021   Procedure: ESOPHAGOGASTRODUODENOSCOPY (EGD) WITH PROPOFOL;  Surgeon: Meryl Dare, MD;  Location: [facility];  Service: Endoscopy;  Laterality: N/A;   HIATAL HERNIA REPAIR  2007; 2009   SAVORY DILATION N/A 04/01/2021   Procedure: SAVORY DILATION;  Surgeon: Meryl Dare, MD;  Location: [facility];  Service: Endoscopy;  Laterality: N/A;   TOTAL KNEE ARTHROPLASTY Left 11/03/2015   TOTAL KNEE ARTHROPLASTY Left 11/03/2015   Procedure: Left Total Knee Arthroplasty;  Surgeon:  Valeria Batman, MD;  Location: [facility] OR;  Service: Orthopedics;  Laterality: Left;   TOTAL KNEE ARTHROPLASTY Right 07/17/2018   Procedure: RIGHT TOTAL KNEE ARTHROPLASTY;  Surgeon: Valeria Batman, MD;  Location: [facility] OR;  Service: Orthopedics;  Laterality: Right;   VIDEO BRONCHOSCOPY Bilateral 01/31/2013   Procedure: VIDEO BRONCHOSCOPY WITHOUT FLUORO;  Surgeon: Merwyn Katos, MD;  Location: [facility] ENDOSCOPY;  Service: Cardiopulmonary;  Laterality: Bilateral;    There were no vitals filed for this visit.   Subjective Assessment - 06/14/21 0737     Subjective Pt reports pain is "a little bit better"    Patient Stated Goals I want to stop that pain.    Currently in Pain? Yes    Pain Score 7     Pain Location Leg    Pain Orientation Right                               [facility] Adult PT Treatment/Exercise - 06/14/21 0001       Knee/Hip Exercises: Stretches   Gastroc Stretch Both;2 reps;20 seconds      Knee/Hip Exercises: Aerobic   Nustep L5 x4 min.  PT present to discuss pt progress.      Knee/Hip Exercises: Machines for Strengthening   Cybex Leg Press 60# 2x10, seat at 7      Knee/Hip Exercises: Standing   Forward  Lunges Both;2 sets;10 reps    Forward Lunges Limitations onto bosu    Rocker Board 1 minute    Rocker Board Limitations fwd/back and side/side each      Manual Therapy   Manual Therapy Soft tissue mobilization;Myofascial release    Manual therapy comments in sitting    Soft tissue mobilization STM Rt lateral gastroc, soleus, Rt peroneals    Myofascial Release trigger point release to R peroneal              Trigger Point Dry Needling - 06/14/21 0001     Consent Given? Yes    Education Handout Provided Previously provided    Muscles Treated Lower Quadrant Peroneals    Peroneals Response Twitch response elicited;Palpable increased muscle length                     PT Short Term Goals - 06/10/21 1230       PT SHORT TERM GOAL  #1   Title Pt will be independent with initial HEP.    Status Achieved               PT Long Term Goals - 06/14/21 8937       PT LONG TERM GOAL #1   Title Pt will be independent with advanced HEP.    Status On-going      PT LONG TERM GOAL #2   Title Pt will increase FOTO to 70% to indicate improved functional mobility.    Status On-going      PT LONG TERM GOAL #3   Title Pt will increase R LE strength to at least 4+/5 to allow him to negoiate steps with ease.    Status On-going      PT LONG TERM GOAL #4   Title Pt will report pain no greater than 3/10 at night to allow him to sleep.    Status On-going                   Plan - 06/14/21 0809     Clinical Impression Statement Alan Roman reports less pain this visit of 7/10.  He states that overall, he feels that he is 70% better than when he first started, though he does still have pain. Following manual therapy, pt requested Dry Needling to his R peroneal muscle, able to illicit muscle twitch.  Pt reporting his R lateral calf feeling better following treatment. Pt is progressing towards goal related activities.    PT Treatment/Interventions ADLs/Self Care Home Management;Aquatic Therapy;Cryotherapy;Electrical Stimulation;Iontophoresis 4mg /ml Dexamethasone;Moist Heat;Traction;Ultrasound;Gait training;Stair training;Functional mobility training;Therapeutic activities;Therapeutic exercise;Balance training;Neuromuscular re-education;Patient/family education;Manual techniques;Passive range of motion;Dry needling;Taping;Vasopneumatic Device;Spinal Manipulations;Joint Manipulations    PT Next Visit Plan manual therapy as indicated;   progress gastroc strength and flexibility    Consulted and Agree with Plan of Care Patient             Patient will benefit from skilled therapeutic intervention in order to improve the following deficits and impairments:  Difficulty walking, Increased muscle spasms, Pain, Impaired flexibility,  Decreased strength  Visit Diagnosis: Muscle weakness (generalized)  Difficulty in walking, not elsewhere classified  Pain in right leg  Hamstring tightness     Problem List Patient Active Problem List   Diagnosis Date Noted   Pain in right leg 05/26/2021   Dysphagia    Gastroesophageal reflux disease    Esophageal stricture    At risk for aspiration 08/07/2019   ILD (interstitial lung disease) (HCC) 06/03/2019   Elevated  diaphragm 06/03/2019   Dyspnea on exertion 05/06/2019   Vocal cord atrophy 05/06/2019   History of total right knee replacement 07/17/2018   Primary osteoarthritis of right knee 05/31/2018   History of tuberculosis 11/05/2015   Orthostatic hypotension 11/05/2015   Constipation 11/05/2015   Thrombocytopenia (HCC) 11/05/2015   Normocytic anemia 11/05/2015   Psoriatic arthritis (HCC) 11/03/2015   Essential hypertension 11/03/2015   Osteoarthritis of left knee 11/03/2015   Status post total knee replacement, left 11/03/2015   Psoriasis 01/23/2013   Immunocompromised state (HCC) 01/23/2013    Reather Laurence, PT, DPT 06/14/2021, 8:22 AM  [facility] [address] [place], [place], [address] Phone: [phone]   Fax:  [phone]  Name: Alan Roman MRN: [id number] Date of Birth: [birth year]

## 2021-06-14 NOTE — Telephone Encounter (Signed)
Submitted a Prior Authorization request to [facility] MEDICARE for PIRFENIDONE via CoverMyMeds. Will update once we receive a response.   Key: [id number]

## 2021-06-15 ENCOUNTER — Ambulatory Visit: Payer: Medicare Other | Admitting: Internal Medicine

## 2021-06-15 ENCOUNTER — Ambulatory Visit (INDEPENDENT_AMBULATORY_CARE_PROVIDER_SITE_OTHER): Payer: Medicare Other | Admitting: Internal Medicine

## 2021-06-15 ENCOUNTER — Other Ambulatory Visit: Payer: Self-pay

## 2021-06-15 ENCOUNTER — Encounter: Payer: Self-pay | Admitting: Internal Medicine

## 2021-06-15 VITALS — BP 142/80 | HR 76 | Ht 67.0 in | Wt 158.6 lb

## 2021-06-15 DIAGNOSIS — J84112 Idiopathic pulmonary fibrosis: Secondary | ICD-10-CM | POA: Diagnosis not present

## 2021-06-15 DIAGNOSIS — Z7189 Other specified counseling: Secondary | ICD-10-CM | POA: Diagnosis not present

## 2021-06-15 LAB — PULMONARY FUNCTION TEST
DL/VA % pred: 75 %
DL/VA: 3.3 ml/min/mmHg/L
DLCO cor % pred: 44 %
DLCO cor: 12.64 ml/min/mmHg
DLCO unc % pred: 44 %
DLCO unc: 12.64 ml/min/mmHg
FEF 25-75 Pre: 2.65 L/sec
FEF2575-%Pred-Pre: 137 %
FEV1-%Pred-Pre: 67 %
FEV1-Pre: 1.72 L
FEV1FVC-%Pred-Pre: 108 %
FEV6-Pre: 2.06 L
FVC-%Pred-Pre: 62 %
FVC-Pre: 2.07 L
Pre FEV1/FVC ratio: 83 %
Pre FEV6/FVC Ratio: 99 %

## 2021-06-15 NOTE — Progress Notes (Signed)
Spiro/Dlco done today. 

## 2021-06-15 NOTE — Progress Notes (Signed)
NOV 2020 with Dr Elsworth Soho  75 year old never smoker of [place] origin initial consult 05/2019  for evaluation of hoarseness, dry cough and congestion. He is originally from [place], [place], [place] to the [place] in 1971 for his bachelors to [employer] and then settled in [place] in 1978 and got into the [employer].  PMH - - psoriatic arthritis by Dr. Metta Clines, was originally on Humira and is now on Cosentyx. - latent TB that was untreated initially and in 12/2012 he presented with a mild alveolitis on CT scan and pleural effusion that required thoracentesis, bronchoscopy was negative for AFB but he was treated under the guidance of local health department and ID with multiple drug antituberculous treatment for 3 months.  His problem lists extrapulmonary TB but on my review all AFB cultures were negative.   -07/2018 chest x-ray shows eventration of right hemidiaphragm.   - vocal cord paralysis in 04/2015 and underwent ENT evaluation for persistent dysphonia and underwent gel injection of the vocal cords at [facility] without any relief  Chief Complaint  Patient presents with   Follow-up    1 month rov- review CT chest 10/31.  pt states his s/s are unchanged since last month, prod cough with clear mucus qam that subsides throughout the day.      Symptoms are persistent. Main issues are hoarseness, dry cough and congestion  We reviewed HRCT in detail today  CT angiogram 10/2015 -which shows mild reticular densities suggestive but not diagnostic of ILD.  HRCT 05/31/19 mild, bibasilar predominant pulmonary fibrosis in a pattern featuring subtle subpleural reticular interstitial opacity, mild traction bronchiectasis and occasional areas of Bronchiolectasis - ' probable UIP ' - slight worse   OV [name]  75 year old male never smoker presenting to our office today after completing pulmonary function testing.  Patient was last seen in our office in October/2020  for initial consult with Dr. Elsworth Soho.  At that office visit patient was scheduled for pulmonary function testing, lab work was completed to further evaluate patient's probable UIP on October/2020 high-resolution CT chest, patient was referred to ear nose and throat, swallow studies were also ordered on the patient.  Patient completed ENT referral in 07/02/2019 with Dr. Wellington Hampshire Recommendations for voice therapy, that he follow-up with gastroenterology regarding this Schatzki's ring.  Follow-up in April.  Proceed forward with pulmonary pulmonary function test.  Patient reports that he is received a telephone call to restart voice rehab but he has not done this yet as he wanted to review pulmonary function testing with our office first.  Patient swallow studies are listed below:  06/06/2019-swallow study-mild aspiration risk, no treatment recommended at this time 06/07/2019-barium swallow-Schatzki ring at the GE junction which prevents passage of a 13 mm barium tablet  Patient has a gastroenterologist Dr. Collene Mares but he has not seen her in over a year.  Patient has no scheduled upcoming appointments.  Patient continues to follow-up with rheumatology, Dr. Estanislado Pandy.  Per chart review last office note was in July/2020.  Patient presenting today to review December/2020 pulmonary function test.  Those results are listed below:  07/19/2019-pulmonary function test-FVC 2.32 (63% predicted), postbronchodilator ratio 93, postbronchodilator FEV1 1.93 (69% predicted), DLCO 13.59 (43% predicted) >>>Severe restriction, moderately severe diffusion defect  Lab work from [facility] are also listed below:  06/03/2019-ANA-1: 40, nuclear homogenous 06/03/2019-rheumatoid factor-14 06/03/2019-anti-Jo 1-less than 0.2 06/03/2019-aldolase-4.9 06/03/2019-centromere antibodies-negative 06/03/2019-antiscleroderma antibody-less than 1 - 06/03/2019-Sjogren's syndrome 8 and B-negative 06/03/2019-hypersensitivity  pneumonitis-negative 06/03/2019-sed rate  22  Mildly elevated ANA and rheumatoid factor.  Felt to be not clinically significant.  Rest of results are negative.  Patient continues to report that he is asymptomatic.  Patient tolerates walk in office without any oxygen desaturations.  He does continue to have occasional bouts of congestion which is believed to be related to the Cosentyx.  Family has messaged rheumatology regarding this.  Tests:   07/19/2019-pulmonary function test-FVC 2.32 (63% predicted), postbronchodilator ratio 93, postbronchodilator FEV1 1.93 (69% predicted), DLCO 13.59 (43% predicted) >>>Severe restriction, moderately severe diffusion defect  07/16/2019-SARS-CoV-2-not detected  06/03/2019-ANA-1: 40, nuclear homogenous 06/03/2019-rheumatoid factor-14 06/03/2019-anti-Jo 1-less than 0.2 06/03/2019-aldolase-4.9 06/03/2019-centromere antibodies-negative 06/03/2019-antiscleroderma antibody-less than 1 - 06/03/2019-Sjogren's syndrome 8 and B-negative 06/03/2019-hypersensitivity pneumonitis-negative 06/03/2019-sed rate 22  06/01/2019-CT chest high-res mild, bibasilar predominant pulmonary fibrosis in a pattern with featuring subtle subpleural reticular interstitial opacity and mild traction bronchiectasis occasional areas of bronchiolectasis, probable UIP  06/06/2019-swallow study-mild aspiration risk, no treatment recommended at this time  06/07/2019-barium swallow-Schatzki ring at the GE junction which prevents passage of a 13 mm barium tablet  FENO:  No results found for: NITRICOXIDE  MH: Psoriatic arthritis (Dr. Estanislado Pandy - prev on Humira, now on Cosentyx), latent TB that was untreated initially in 12/2012 with mild alveolitis on CT scan and pleural effusion that required thoracentesis, bronchoscopy was negative for AFB but he was treated under guidance with local health department infectious disease with multidrug antituberculosis treatment for 3 months, eventration of right  hemidiaphragm, vocal cord paralysis in September/2016 underwent ENT evaluation for persistent dysphonia and underwent gel injection of vocal cords at [facility] without any relief Smoker/ Smoking History: Never smoker Maintenance: None   He is originally from [place], [place], [place] to the [place] in [place] for his bachelors to [employer] and then settled in [place] in 1978 and got into [employer] business.    OV 11/10/2020  Subjective:  Patient ID: Alan Roman, male , DOB: [birth year] , age 23 y.o. , MRN: [id number] , ADDRESS: [place] [id number] PCP Glenda Chroman, MD Patient Care Team: Glenda Chroman, MD as PCP - General (Internal Medicine)  This Provider for this visit: Treatment Team:  Attending Provider: Brand Males, MD    11/10/2020 -   Chief Complaint  Patient presents with   Follow-up    Increased coughing for past 3 months, sometimes productive with whitish sputum. Denies shortness of breath.      HPI [facility] 75 y.o. -asked by Dr. Einar Gip to establish at the pulmonary fibrosis center.  Therefore patient is seeing Dr. Chase Caller.  Patient used to be previously followed by Dr. Elsworth Soho.  Last seen by Dr. Elsworth Soho in 2020.  Then seen by nurse practitioner [name] in January 2021.   His daughtre Guilford Shi later joined on the phone call.  Patient is known to have ILD probable UIP pattern.  Is on observation at this point.   [place] Integrated Comprehensive ILD Questionnaire  Symptoms:  -   he reports insidious onset of dyspnea for the last 4 months.  Since it started it is the same.  Although his ILD is present even before that.  He says that he has a very mild cough however in the last 4 to 6 months it is worse.  Particularly when he talks for a long time.  He does not wake up in the middle of the night because of the cough he does bring up some white sputum that is very light white.  Cough does  not affect his voice.  He does clear his  throat.  He has never had hemoptysis.  He does not cough when he lies down.  No nausea vomiting or diarrhea.  No wheezing.  Cough does get worse with eating   SYMPTOM SCALE - ILD 11/10/2020   O2 use ra  Shortness of Breath 0 -> 5 scale with 5 being worst (score 6 If unable to do)  At rest 0  Simple tasks - showers, clothes change, eating, shaving 0  Household (dishes, doing bed, laundry) 0  Shopping 0  Walking level at own pace 1  Walking up Stairs 1  Total (30-36) Dyspnea Score 2  How bad is your cough? 1  How bad is your fatigue x  How bad is nausea 0  How bad is vomiting?  0  How bad is diarrhea? 0  How bad is anxiety? x  How bad is depression x       Past Medical History : He has psoriatic arthritis.  He is immunosuppressed and is on immunomodulators.  Currently on Simponi.  He does not have rheumatoid arthritis.  No heart failure.  No other collagen vascular disease of connective tissue disease.  No COPD.  No asthma.  No HIV.  No hiatal hernia.  No pulmonary hypertension.  No diabetes.  No thyroid disease.  No stroke no seizures.  No tuberculosis no kidney disease no pneumonia.  No blood clots no heart disease no pleurisy.   He is deemed to be an aspiration risk he has vocal cord paralysis.  He is to see Dr. Lore[facility].  He wants to switch to [facility] but because of the pandemic has not been able to establish.  ROS: He does not have any fatigue.  No arthralgia.  No  dysphagia.  No Raynaud's.  No dry eyes no fever no weight loss.  No nausea no vomiting.  No heartburn no snoring no rash no ulcers.   FAMILY HISTORY of LUNG DISEASE: No pulmonary fibrosis.  No COPD.  No asthma no sarcoid no cystic fibrosis no hypersensitive pneumonitis no autoimmune disease in the family.    EXPOSURE HISTORY: No tobacco.  No smoking cigars no smoking pipes.  No electronic cigarette use.  No vaping marijuana.  No cocaine use.  No intravenous drug use.   HOME and HOBBY DETAILS : He lives in a  76 year old family home.  He is lived there for 40 years.  No damp environment.  No mold or mildew.  No humidifier use no CPAP use no nebulizer use.  No steam iron use.  No Jacuzzi use.  No misting Fountain.  No pet birds or parakeets.  No feather pillows or blankets.  No mold in the [facility] duct.  No music habits no gardening habits.  Never exposed to birds or chickens.  No water damage in the basement.  No straw meds.  No hot tub use.  No animals at work.    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  Organic antigen exposure history is negative.  Inorganic antigen exposure history is negative.   PULMONARY TOXICITY HISTORY (27 items):  Simponi for psoriatic arthritis     PFT  PFT Results Latest Ref Rng & Units 07/19/2019  FVC-Pre L 2.32  FVC-Predicted Pre % 63  FVC-Post L 2.06  FVC-Predicted Post % 56  Pre FEV1/FVC % % 83  Post FEV1/FCV % % 93  FEV1-Pre L 1.94  FEV1-Predicted Pre % 69  FEV1-Post L 1.93  DLCO uncorrected ml/min/mmHg 13.59  DLCO UNC% % 43  DLVA Predicted % 90  TLC L 3.80  TLC % Predicted % 55  RV % Predicted % 60    HRCT Oct 2020   IMPRESSION: 1. There is mild, bibasilar predominant pulmonary fibrosis in a pattern featuring subtle subpleural reticular interstitial opacity, mild traction bronchiectasis and occasional areas of bronchiolectasis, for example in the lateral segment right middle lobe and right lung base (series 4, image 176). These findings are very slightly worsened in comparison to prior examination dated 11/10/2015. Findings are consistent with mild pulmonary fibrosis in a probable UIP pattern. Findings are categorized as probable UIP per consensus guidelines: Diagnosis of Idiopathic Pulmonary Fibrosis: An Official ATS/ERS/JRS/ALAT Clinical Practice Guideline. Am Rosezetta Schlatter Crit Care Med Vol 198, Iss 5, [phone], Apr 01 2017.   2.  Coronary artery disease. Aortic Atherosclerosis (ICD10-I70.0).   3.   Cholelithiasis.     Electronically Signed   By: Lauralyn Primes M.D.   On: 06/01/2019 13:26    Suspect given age greater than 65, Velcro crackles at the base, previous description of probable UIP and nearly negative serology [psoriatic arthritis does not count is connective tissue disease ILD], vocal cord paralysis with some aspiration risk that this is IPF. I am worried that his worsening cough reflects progression although this could just be worsening acid reflux.  I think he would benefit from high-resolution CT scan and pulmonary function test for restaging but also get a GI referral.  He wants to stay within the [facility] family for his GI services    .OV 12/18/2020  Subjective:  Patient ID: Alan Roman, male , DOB: 06/09/1946 , age 59 y.o. , MRN: [id number] , ADDRESS: [address] [place] [address] PCP Ignatius Specking, MD Patient Care Team: Ignatius Specking, MD as PCP - General (Internal Medicine) Yates Decamp, MD as PCP - Cardiology (Cardiology)  This Provider for this visit: Treatment Team:  Attending Provider: Kalman Shan, MD    12/18/2020 -   Chief Complaint  Patient presents with   Follow-up    PFT performed today, HRCT performed 4/25, and EGD scheduled 5/23.  Pt states he has been okay since last visit. States his only concern is hoarseness with voice and an occ cough.     HPI [facility] 75 y.o. -returns for follow-up to discuss his test results.  He continues to have hoarse voice.  He denies any dysphagia.  He is now scheduled for endoscopy 12/21/2020.  He is going to see cardiology ahead of that.  His serologies negative.  His symptoms continue to be minimal and stable.  He had high-resolution CT scan of the chest that shows progression compared to October 2020.  It is described as probable UIP.  Back in October 2020 there was craniocaudal gradient described and no air trapping.  This time April 2022 radiologist feels there is  slight upper lobe predominance and there is air trapping.  This latter description raises the question of possible chronic hypersensitive pneumonitis even though the final conclusion was probable UIP.  I went over exposure history with him again.  No organic antigen exposure history identified.  Nevertheless disease appears progressive.    CT chest HIgh res 11/23/20   IMPRESSION: CLINICAL DATA:  75 year old male with history of bronchiectasis. Follow-up study.   EXAM: CT CHEST WITHOUT CONTRAST   TECHNIQUE: Multidetector CT imaging of the chest was  performed following the standard protocol without intravenous contrast. High resolution imaging of the lungs, as well as inspiratory and expiratory imaging, was performed.   COMPARISON:  Chest CT 05/31/2019.   FINDINGS: Cardiovascular: Heart size is normal. There is no significant pericardial fluid, thickening or pericardial calcification. There is aortic atherosclerosis, as well as atherosclerosis of the great vessels of the mediastinum and the coronary arteries, including calcified atherosclerotic plaque in the left main, left anterior descending and left circumflex coronary arteries.   Mediastinum/Nodes: No pathologically enlarged mediastinal or hilar lymph nodes. Please note that accurate exclusion of hilar adenopathy is limited on noncontrast CT scans. Esophagus is unremarkable in appearance. No axillary lymphadenopathy.   Lungs/Pleura: High-resolution images demonstrate widespread patchy regions of ground-glass attenuation, septal thickening, mild cylindrical bronchiectasis and peripheral bronchiolectasis. Findings have no discernible craniocaudal gradient, and there is significant involvement in the anterior aspects of the upper lobes of the lungs bilaterally. No frank honeycombing. Inspiratory and expiratory imaging demonstrates some mild air trapping indicative of mild small airways disease. Minimal progression compared to  the prior examination. No acute consolidative airspace disease. No pleural effusions. No suspicious appearing pulmonary nodules or masses are noted.   Upper Abdomen: Aortic atherosclerosis. 5 mm calcified gallstone in the neck of the gallbladder. Small calcified granuloma in the spleen incidentally noted.   Musculoskeletal: There are no aggressive appearing lytic or blastic lesions noted in the visualized portions of the skeleton 1. The appearance of the lungs is compatible with interstitial lung disease, with a spectrum of findings once again categorized as probable usual interstitial pneumonia (UIP) per current ATS guidelines. Minimal progression of disease compared to the prior examination. 2. Aortic atherosclerosis, in addition to left main and 2 vessel coronary artery disease. Assessment for potential risk factor modification, dietary therapy or pharmacologic therapy may be warranted, if clinically indicated. 3. Cholelithiasis.   Aortic Atherosclerosis (ICD10-I70.0).     Electronically Signed   By: Trudie Reed M.D.   On: 11/24/2020 08:04   xxxx Results for GLOYD, HAPP "JAGDISH" (MRN [id number]) as of 12/18/2020 16:47  Ref. Range 06/03/2019 10:42  Aspergillus Fumigatus Latest Ref Range: NEGATIVE  NEGATIVE  Pigeon Serum Latest Ref Range: NEGATIVE  NEGATIVE  Anti Nuclear Antibody (ANA) Latest Ref Range: NEGATIVE  POSITIVE (A)  ANA Pattern 1 Unknown Nuclear, Homogeneous (A)  ANA Titer 1 Latest Units: titer 1:40 (H)  Anti JO-1 Latest Ref Range: 0.0 - 0.9 AI <0.2  CENTROMERE AB SCREEN Latest Ref Range: <1.0 NEG AI <1.0 NEG  RA Latex Turbid. Latest Ref Range: <14 IU/mL 14 (H)  SSA (Ro) (ENA) Antibody, IgG Latest Ref Range: <1.0 NEG AI <1.0 NEG  SSB (La) (ENA) Antibody, IgG Latest Ref Range: <1.0 NEG AI <1.0 NEG  Scleroderma (Scl-70) (ENA) Antibody, IgG Latest Ref Range: <1.0 NEG AI <1.0 NEG    OV 01/21/2021  Subjective:  Patient ID: Alan Roman,  male , DOB: [birth year] , age 69 y.o. , MRN: [id number] , ADDRESS: [address] [place] [address] PCP Ignatius Specking, MD Patient Care Team: Ignatius Specking, MD as PCP - General (Internal Medicine) Yates Decamp, MD as PCP - Cardiology (Cardiology)  This Provider for this visit: Treatment Team:  Attending Provider: Kalman Shan, MD    01/21/2021 -   Chief Complaint  Patient presents with   Follow-up    Pt states he has been doing okay since last visit. States that he has not started on the OFEV medication yet.     HPI  Pant[facility]is [facility] 19 y.o. -presents for follow-up in this visit he was supposed to have started nintedanib by now.  However he is yet to start this.  There are  supply chain issues.  But he says it is approved.  Overall no change in symptoms.  Unclear if he is finished his GI work-up.  He continues to have some hoarse voice.  He has not seen ENT.  He is accepted my recommendation to have a referral.  He did asked me whether a few episode of chlorine exposure acutely 40 years ago could have caused his pulmonary fibrosis.  I told him while it was possible it is unlikely.  Unclear about single episode although possible as be seen with COVID   02/18/2021 Patient contacted today for 4 week follow-up. He started OFEV anti-fibrotic on June 30th. He developed headache and diarrhea after 2 days of starting medication. He stopped taking OFEV 4 days ago d/t GI symptoms and fatigue. He also noted that his blood pressure was elevated. His symptoms have resolved since stopping medication.    MDD confderence Sept 2022  Dr Ashley Murrain - November 23, 2020 - HRCT - compared to CTA 2017 and HRCT 2020. Recent CT  moderage patchy retic, and some GGO. TB is mild . Findings are distriubted evenly - no CCG.  No frank HC. clearly tehre is fibrotic ILD. Air trapping is ver minimal and in lower lobes and is physiologic and not clinicall signifcant. No AT in upper lobe or middle lobe/lingulare   Final  DX: Prob UIP and ther is mild progression since 2020 and defintiey since 2017.   Clinical dx:  IPF and Rx as such   OV 04/20/2021  Subjective:  Patient ID: Alan Roman, male , DOB: 09-17-1945 , age 21 y.o. , MRN: [id number] , ADDRESS: [address] [place] [address] PCP Ignatius Specking, MD Patient Care Team: Ignatius Specking, MD as PCP - General (Internal Medicine) Yates Decamp, MD as PCP - Cardiology (Cardiology)  This Provider for this visit: Treatment Team:  Attending Provider: Kalman Shan, MD    04/20/2021 -   Chief Complaint  Patient presents with   Follow-up    Pt states his BP is still increasing even after going to 1 pill of the OFEV a day.   IPF: age greater than 65, Velcro crackles at the base, previous description of probable UIP and nearly negative serology [psoriatic arthritis does not count is connective tissue disease ILD], vocal cord paralysis with some aspiration risk that this is IPF.   - Dx given June 2022 - Confirmd MDD conferne Sept 2022   - Ofev June 2022  HPI [facility] 75 y.o. -presents for follow-up.  I am meeting his wife for the first time.  He feels he is stable.  He is walking 30 minutes in the mornings and he can do this without stopping.  He says his dyspnea is not worse and his cough is not worse but objectively on the subjective symptom questionnaire there might be progression.  We did discuss his case in the ILD conference.  The diagnosis is IPF.  There is progression.  In terms of therapeutics we started him on nintedanib at 150 mg twice daily but he called saying that he was intolerant to it because of hypertension [anecdotally he is my first patient with hypertension in 6 years with nintedanib] but also diarrhea and fatigue.  He then stopped it and apparently his blood pressure normalized.  Then 04/10/2021 he  started himself on nintedanib 100 mg strength.  He is currently doing only 1 time daily.  He feels his blood pressure  is going up again to 140 systolic.  [At 150 twice a day it was at 150 systolic] and this is no normal.  He is not having any other symptoms.  We discussed the option of switching to pirfenidone and going through approval process again.  We discussed the side effects of skin issues, fatigue, nausea, anorexia and ongoing liver monitoring with significant overlap with nintedanib side effects.  At this point in time he is resolved to increase his nintedanib to 100 mg twice daily and if he is tolerating well and that go to 150 mg twice daily.  If this fails then he will go to pirfenidone.  End of November 2022 he is going to go to [place] till February 2022.  He will have a high-dose flu shot today.         OV 06/07/2021  Subjective:  Patient ID: Alan Roman, male , DOB: 1946-03-15 , age 64 y.o. , MRN: [id number] , ADDRESS: [address] [place] [address] PCP Ignatius Specking, MD Patient Care Team: Ignatius Specking, MD as PCP - General (Internal Medicine) Yates Decamp, MD as PCP - Cardiology (Cardiology)  This Provider for this visit: Treatment Team:  Attending Provider: Kalman Shan, MD    06/07/2021 -   Chief Complaint  Patient presents with   Follow-up    4wk f/u after starting Ofev. Stopped the Ofev about 3 weeks ago due to increased blood pressure. Plans to restart it next Monday. Denies any increased nausea, diarrhea or loss of appetite.    IPF: age greater than 65, Velcro crackles at the base, previous description of probable UIP and nearly negative serology [psoriatic arthritis does not count is connective tissue disease ILD], vocal cord paralysis with some aspiration risk that this is IPF.   - Dx given June 2022 - Confirmd MDD conferne Sept 2022   - Ofev June 2022 - stopped Oct/Nov 2022 due to hypertension  HPI [facility] 75 y.[facility]. -returns for his IPF follow-up.  This particular visit is to focus on how he is tolerating his nintedanib.  He is extremely  concerned his nintedanib is causing hypertension.  When he was taking 100 mg which is the lower dose at 1 pill/day he did not have hypertension.  He then increase it to 100 mg twice daily [this is the lowest therapeutic dose].  He says this caused his blood pressure systolic to go up to 140s.  He has since stopped taking all nintedanib.  His blood pressure is now normal.  He does not want take nintedanib anymore.  We discussed the alternative of taking pirfenidone.  He is open to this idea.  Pirfenidone is not known to cause hypertension.  In fact even at nintedanib I am not seen any patient developed hypertension except him.  We discussed the other side effects of pirfenidone including GI issues, weight loss, nausea, liver function test monitoring and necessity for sunscreen.  He is willing to do all this.  He has upcoming travel to [place] towards the end of November 2022 through February 2023.  We explained that not taking the antifibrotic but somewhat less than 5% chance of progression over 3 months.  He does not want to take this risk because of prior progression.  He just wants to take the medication pirfenidone.  So we will make a request for this.  I did advise him to register himself with a pulmonologist and his home city in [place].  I also spoke to his daughter over the phone.  They will arrange this.  He is willing to do video and telephone visit with me when he is in [place].  Of note he developed back pain with right-sided sciatica 3 weeks ago.  He has right-sided sciatica.  He had an MRI that shows DJD findings.  He will follow-up with Dr. Cleophas Dunker orthopedics for this.  He and his wife and daughter Huel Cote on the phone] feel that he may not make it [place] because of the back pain.  I advised her to talk about this with Dr. Cleophas Dunker.  Of note he has upcoming pulmonary function test appointment in 1 week.  He also has office visit.  We discussed this with him.  He will reschedule his office visit as a  televisit to discuss his pirfenidone uptake and his pulmonary function test combined.  He will do this from [place].      OV 06/15/2021  Subjective:  Patient ID: Alan Roman, male , DOB: 01-25-1946 , age 82 y.o. , MRN: [id number] , ADDRESS: [address] [place] [address] PCP Ignatius Specking, MD Patient Care Team: Ignatius Specking, MD as PCP - General (Internal Medicine) Yates Decamp, MD as PCP - Cardiology (Cardiology)  This Provider for this visit: Treatment Team:  Attending Provider: Kalman Shan, MD    06/15/2021 -   Chief Complaint  Patient presents with   Follow-up    Pft done today. No new concerns   IPF: age greater than 65, Velcro crackles at the base, previous description of probable UIP and nearly negative serology [psoriatic arthritis does not count is connective tissue disease ILD], vocal cord paralysis with some aspiration risk that this is IPF.   - Dx given June 2022 - Confirmd MDD conferne Sept 2022   - Ofev June 2022 - stopped Oct/Nov 2022 due to hypertension  -Pirfenidone start November 2022  HPI Alan Roman [facility] 75 y.o. -returns for follow-up.  His wife is here with him.  His symptom score shows minimal symptoms and is actually better.  His pulmonary function test shows slight worsening compared to 2 years ago.  But he is not feeling this.  He is off his nintedanib and is feeling better.  He is decided to start pirfenidone.  I did indicate to him that his progression.  He is here to start.  Pirfenidone because although approved the shipment is pending.  I sent a message to the pharmacist to see when his shipment would arrive.  His weight is stable.  He is doing some physical therapy for his back.  He is going to see Dr. Cleophas Dunker tomorrow.  He will register with a  pulmonologist in [place].  While in [place] once his [place] pirfenidone runs he will get local pirfenidone.  He will get this through his pulmonologist there.      SYMPTOM SCALE  - ILD 11/10/2020 and 12/18/2020   01/21/2021  04/20/2021 ofev 06/15/2021 158# - off ofev. Going to start esbriet  O2 use ra ra ra ra  Shortness of Breath 0 -> 5 scale with 5 being worst (score 6 If unable to do)     At rest 0 0 0 0  Simple tasks - showers, clothes change, eating, shaving 0 0 0 0  Household (dishes, doing bed, laundry) 0 1 2 0  Shopping 0 0 0 0  Walking  level at own pace 1 0 1 0  Walking up Stairs 1 1 1 1   Total (30-36) Dyspnea Score 2 2 4 1   How bad is your cough? 1 1 2.5 0  How bad is your fatigue x 0 0 0  How bad is nausea 0 0 0 0  How bad is vomiting?  0 00 0 0  How bad is diarrhea? 0  0 0  How bad is anxiety? x 0 0 0  How bad is depression x 0 0 0        Simple office walk 185 feet x  3 laps goal with forehead probe 11/10/2020  06/07/2021   O2 used ra ra  Number laps completed 3 3  Comments about pace avg   Resting Pulse Ox/HR 100% and 66/min 99% and HR 79  Final Pulse Ox/HR 100% and 103/min 97% and HR 102  Desaturated </= 88% no no  Desaturated <= 3% points no no  Got Tachycardic >/= 90/min yes yes  Symptoms at end of test none Mild dyspnea  Miscellaneous comments x stabe      PFT  PFT Results Latest Ref Rng & Units 06/15/2021 12/18/2020 07/19/2019  FVC-Pre L 2.07 1.98 2.32  FVC-Predicted Pre % 62 59 63  FVC-Post L - 2.01 2.06  FVC-Predicted Post % - 60 56  Pre FEV1/FVC % % 83 89 83  Post FEV1/FCV % % - 92 93  FEV1-Pre L 1.72 1.76 1.94  FEV1-Predicted Pre % 67 69 69  FEV1-Post L - 1.84 1.93  DLCO uncorrected ml/min/mmHg 12.64 14.24 13.59  DLCO UNC% % 44 50 43  DLCO corrected ml/min/mmHg 12.64 14.24 -  DLCO COR %Predicted % 44 50 -  DLVA Predicted % 75 104 90  TLC L - 3.60 3.80  TLC % Predicted % - 55 55  RV % Predicted % - 34 60       has a past medical history of Acute bronchiolitis due to other infectious organisms (01/26/2013), Arthritis, Extrapulmonary TB (tuberculosis) (02/02/2013), GERD (gastroesophageal reflux disease), High  risk medication use (12/27/2016), History of hiatal hernia (2007; 2009), Hyperlipidemia, Hypertension, and Pleural effusion (01/26/2013).   reports that he has never smoked. He has never used smokeless tobacco.  Past Surgical History:  Procedure Laterality Date   BIOPSY  04/01/2021   Procedure: BIOPSY;  Surgeon: Meryl Dare, MD;  Location: [facility];  Service: Endoscopy;;   CATARACT EXTRACTION W/ INTRAOCULAR LENS  IMPLANT, BILATERAL Bilateral    ESOPHAGOGASTRODUODENOSCOPY (EGD) WITH PROPOFOL N/A 04/01/2021   Procedure: ESOPHAGOGASTRODUODENOSCOPY (EGD) WITH PROPOFOL;  Surgeon: Meryl Dare, MD;  Location: [facility];  Service: Endoscopy;  Laterality: N/A;   HIATAL HERNIA REPAIR  2007; 2009   SAVORY DILATION N/A 04/01/2021   Procedure: SAVORY DILATION;  Surgeon: Meryl Dare, MD;  Location: [facility];  Service: Endoscopy;  Laterality: N/A;   TOTAL KNEE ARTHROPLASTY Left 11/03/2015   TOTAL KNEE ARTHROPLASTY Left 11/03/2015   Procedure: Left Total Knee Arthroplasty;  Surgeon: Valeria Batman, MD;  Location: [facility] OR;  Service: Orthopedics;  Laterality: Left;   TOTAL KNEE ARTHROPLASTY Right 07/17/2018   Procedure: RIGHT TOTAL KNEE ARTHROPLASTY;  Surgeon: Valeria Batman, MD;  Location: [facility] OR;  Service: Orthopedics;  Laterality: Right;   VIDEO BRONCHOSCOPY Bilateral 01/31/2013   Procedure: VIDEO BRONCHOSCOPY WITHOUT FLUORO;  Surgeon: Merwyn Katos, MD;  Location[facility] END[facility]COPY;  Service: Cardiopulmonary;  Laterality: Bilateral;    Allergies  Allergen Reactions   Ofev [Nintedanib]  Hypertension    Immunization History  Administered Date(s) Administered   Fluad Quad(high Dose 65+) 04/20/2021   Influenza, High Dose Seasonal PF 04/22/2019   PPD Test 04/06/2010   Tdap 05/30/2017    Family History  Problem Relation Age of Onset   Colon cancer Neg Hx    Stomach cancer Neg Hx    Esophageal cancer Neg Hx    Colon polyps Neg Hx    Rectal cancer Neg Hx      Current  Outpatient Medications:    aspirin (ASPIRIN CHILDRENS) 81 MG chewable tablet, Chew 1 tablet (81 mg total) by mouth daily., Disp: , Rfl:    clobetasol cream (TEMOVATE) 0.05 %, Apply 1 application topically 2 (two) times daily. (Patient taking differently: Apply 1 application topically daily as needed (dry amd itching skin).), Disp: 45 g, Rfl: 1   losartan (COZAAR) 50 MG tablet, Take 1 tablet (50 mg total) by mouth daily., Disp: 90 tablet, Rfl: 1   multivitamin-iron-minerals-folic acid (CENTRUM) chewable tablet, Chew 1 tablet by mouth daily., Disp: , Rfl:    omeprazole (PRILOSEC) 20 MG capsule, TAKE 1 CAPSULE(20 MG) BY MOUTH DAILY, Disp: 90 capsule, Rfl: 1   rosuvastatin (CRESTOR) 5 MG tablet, Take 1 tablet (5 mg total) by mouth daily., Disp: 90 tablet, Rfl: 3   STELARA 45 MG/0.5ML SOSY injection, Inject 45 mg into the skin every 3 (three) months., Disp: , Rfl:    Triamcinolone Acetonide (TRIAMCINOLONE 0.1 % CREAM : EUCERIN) CREA, Apply 1 application topically 2 (two) times daily., Disp: 1 each, Rfl: 0   traMADol (ULTRAM) 50 MG tablet, Take 1 tablet (50 mg total) by mouth every 6 (six) hours as needed. (Patient not taking: Reported on 06/15/2021), Disp: 30 tablet, Rfl: 0      Objective:   Vitals:   06/15/21 1351  BP: (!) 142/80  Pulse: 76  SpO2: 97%  Weight: 158 lb 9.6 oz (71.9 kg)  Height: 5\' 7"  (1.702 m)    Estimated body mass index is 24.84 kg/m as calculated from the following:   Height as of this encounter: 5\' 7"  (1.702 m).   Weight as of this encounter: 158 lb 9.6 oz (71.9 kg).  @WEIGHTCHANGE @  Filed Weights   06/15/21 1351  Weight: 158 lb 9.6 oz (71.9 kg)     Physical Exam Alert and oriented x3.  Pleasant.  Discussion only visit mostly.    Assessment:       ICD-10-CM   1. IPF (idiopathic pulmonary fibrosis) (HCC)  J84.112     2. Encounter for medication counseling  Z71.89          Plan:     Patient Instructions  IPF (idiopathic pulmonary fibrosis)  (HCC)  -slightly progressive in 2 years  - intolerance to ofev with hypertemsion  Plan --CMA to  List ofev in allergy as hypertension  - make PFT  for spiro and dlco appt feb 2023 - start esbriet  for IPF - will need monitoring in with lung doc there - have sent message to [facility] regarding update on shipment  Back pain   -per Dr Mar 2023  Travel aDvice - [place] Nov 2022- feb 2023  Plan  -- once in [place] you need pulmonary doctor to monitor your esbret   Follow-up - video visit late dec 2022 while in Mar 2023 - can do with app - to ensure esbriet doing ok - - next office visit In feb 2023 but after PFT testing  - Jan 2023 with Dr  Eiza Canniff   ( Level 03: Esbt 20-29 min  min visit spent in  in total care time and counseling or/and coordination of care by this undersigned MD - Dr Kalman Shan. This includes one or more of the following all delivered on this same day 06/15/2021: pre-charting, chart review, note writing, documentation discussion of test results, diagnostic or treatment recommendations, prognosis, risks and benefits of management options, instructions, education, compliance or risk-factor reduction. It excludes time spent by the CMA or office staff in the care of the patient. Actual time was 22 min)  SIGNATURE    Dr. Kalman Shan, M.D., F.C.C.P,  Pulmonary and Critical Care Medicine Staff Physician, [facility] Center Director - Interstitial Lung Disease  Program  Pulmonary Fibrosis [facility] at [facility] [place], [place], [address]  Pager: [phone], If no answer or between  15:00h - 7:00h: call [phone] Telephone: [phone]  2:37 PM 06/15/2021

## 2021-06-15 NOTE — Patient Instructions (Addendum)
IPF (idiopathic pulmonary fibrosis) (HCC)  -slightly progressive in 2 years  - intolerance to ofev with hypertemsion  Plan --CMA to  List ofev in allergy as hypertension  - make PFT  for spiro and dlco appt feb 2023 - start esbriet  for IPF - will need monitoring in [place] with lung doc there - have sent message to [facility] regarding update on shipment  Back pain   -per Dr Alice Reichert  Travel aDvice - [place] Nov 2022- feb 2023  Plan  -- once in [place] you need pulmonary doctor to monitor your esbret   Follow-up - video visit late dec 2022 while in [place] - can do with app - to ensure esbriet doing ok - - next office visit In feb 2023 but after PFT testing  - with Dr Marchelle Gearing

## 2021-06-15 NOTE — Telephone Encounter (Signed)
Submitted Patient Assistance Application to [employer] for ESBRIET on 06/14/21 along with provider portion, patient portion PA and income documents. Will update patient when we receive a response.  Fax# [phone] Phone# [phone]  Chesley Mires, PharmD, MPH, BCPS Clinical Pharmacist (Rheumatology and Pulmonology)

## 2021-06-16 ENCOUNTER — Ambulatory Visit
Admission: RE | Admit: 2021-06-16 | Discharge: 2021-06-16 | Disposition: A | Discharge status: Home / Self Care | Payer: Medicare Other | Source: Ambulatory Visit | Attending: Orthopaedic Surgery | Admitting: Orthopaedic Surgery

## 2021-06-16 ENCOUNTER — Other Ambulatory Visit: Payer: Self-pay

## 2021-06-16 ENCOUNTER — Ambulatory Visit: Payer: Medicare Other | Admitting: Orthopaedic Surgery

## 2021-06-16 ENCOUNTER — Encounter: Payer: Self-pay | Admitting: Orthopaedic Surgery

## 2021-06-16 DIAGNOSIS — M79604 Pain in right leg: Secondary | ICD-10-CM | POA: Diagnosis not present

## 2021-06-16 MED ORDER — METHYLPREDNISOLONE 4 MG PO TBPK: ORAL_TABLET | ORAL | 0 refills | Status: DC

## 2021-06-16 NOTE — Progress Notes (Signed)
Office Visit Note   Patient: Alan Roman           Date of Birth: June 27, 1946           MRN: [id number] Visit Date: 06/16/2021              Requested by: Ignatius Specking, MD [address] [place],  [place] [address] PCP: Ignatius Specking, MD   Assessment & Plan: Visit Diagnoses:  1. Pain in right leg     Plan: Seen at the end of October for evaluation of low back and right lower extremity pain that I thought were associated.  I ordered an MRI scan of his lumbar spine that demonstrated spondylosis at numerous levels with 1 area of mild spinal stenosis at L4-5.  There was no foraminal stenosis at any of the lumbar levels.  No evidence of a listhesis.  He has tramadol for pain.  He still is having some trouble with the lateral aspect of his right leg in the area of the proximal third of the fibula.  X-rays were negative.  On occasion he has some discomfort when he shaves and it is if it were originating from his neck but his exam was benign today in terms of neck range of motion or referred discomfort.  He does have some discomfort along the proximal third of his right leg along the fibula.  No calf pain or distal edema.  Think it is worth obtaining an MRI scan of that area.  He is leaving for [place] on 27 November we may or may not be able to get a performed before that.  In the meantime I will order a Medrol Dosepak.  There certainly is a possibility that this pain is still referred from his back.  Because he still experiencing some pain and even to palpation along the proximal right fibular I think it is appropriate to order the scan as above  Follow-Up Instructions: Return After MRI scan of right leg below the knee.   Orders:  Orders Placed This Encounter  Procedures   MR TIBIA FIBULA RIGHT WO CONTRAST   Meds ordered this encounter  Medications   methylPREDNISolone (MEDROL DOSEPAK) 4 MG TBPK tablet    Sig: Take as directed    Dispense:  21 tablet    Refill:  0      Procedures: No  procedures performed   Clinical Data: No additional findings.   Subjective: Chief Complaint  Patient presents with   Right Leg - Pain, Follow-up  Patient presents today for follow up on his right leg pain. He has had an MRI and has discussed his results over the phone with Dr.Whitfield. He said that he has noticed very minimal improvement. He has attended 4-5sessions of physical therapy.  Still experiencing pain along the lateral aspect of his right leg below his knee prosthesis.  He specifically relates not having any issues with either knee.  He still denies any numbness or tingling  HPI  Review of Systems   Objective: Vital Signs: There were no vitals taken for this visit.  Physical Exam Constitutional:      Appearance: He is well-developed.  Pulmonary:     Effort: Pulmonary effort is normal.  Skin:    General: Skin is warm and dry.  Neurological:     Mental Status: He is alert and oriented to person, place, and time.  Psychiatric:        Behavior: Behavior normal.    Ortho Exam  straight leg raise is negative.  No percussible tenderness lumbar spine.  Not using any ambulatory aid nor does he have a limp.  He does have some tenderness along the proximal third of the right fibula.  I can palpate what appears to be one of the peroneal muscles that moves beneath the examining finger that he does not have on the opposite side.  It feels firm but not as if it were a mass.  He does not experience any distal pain without movement.  Motor motor and sensory exam appear to be intact.  No pain about the right knee and no effusion. Painless range of motion of the cervical spine.  He is able to touch his chin to his chest.  He had neck extension without any referred pain to either upper extremity.  Reflexes were symmetrical to both upper extremity with excellent strength  Specialty Comments:  No specialty comments available.  Imaging: No results found.   PMFS History: Patient Active  Problem List   Diagnosis Date Noted   Pain in right leg 05/26/2021   Dysphagia    Gastroesophageal reflux disease    Esophageal stricture    At risk for aspiration 08/07/2019   ILD (interstitial lung disease) (HCC) 06/03/2019   Elevated diaphragm 06/03/2019   Dyspnea on exertion 05/06/2019   Vocal cord atrophy 05/06/2019   History of total right knee replacement 07/17/2018   Primary osteoarthritis of right knee 05/31/2018   History of tuberculosis 11/05/2015   Orthostatic hypotension 11/05/2015   Constipation 11/05/2015   Thrombocytopenia (HCC) 11/05/2015   Normocytic anemia 11/05/2015   Psoriatic arthritis (HCC) 11/03/2015   Essential hypertension 11/03/2015   Osteoarthritis of left knee 11/03/2015   Status post total knee replacement, left 11/03/2015   Psoriasis 01/23/2013   Immunocompromised state (HCC) 01/23/2013   Past Medical History:  Diagnosis Date   Acute bronchiolitis due to other infectious organisms 01/26/2013   Arthritis    "knees" (11/03/2015)   Extrapulmonary TB (tuberculosis) 02/02/2013   antibx therapy in 2014   GERD (gastroesophageal reflux disease)    on meds   High risk medication use 12/27/2016   History of hiatal hernia 2007; 2009   Hyperlipidemia    on meds   Hypertension    on meds   Pleural effusion 01/26/2013    Family History  Problem Relation Age of Onset   Colon cancer Neg Hx    Stomach cancer Neg Hx    Esophageal cancer Neg Hx    Colon polyps Neg Hx    Rectal cancer Neg Hx     Past Surgical History:  Procedure Laterality Date   BIOPSY  04/01/2021   Procedure: BIOPSY;  Surgeon: Meryl Dare, MD;  Location: [facility];  Service: Endoscopy;;   CATARACT EXTRACTION W/ INTRAOCULAR LENS  IMPLANT, BILATERAL Bilateral    ESOPHAGOGASTRODUODENOSCOPY (EGD) WITH PROPOFOL N/A 04/01/2021   Procedure: ESOPHAGOGASTRODUODENOSCOPY (EGD) WITH PROPOFOL;  Surgeon: Meryl Dare, MD;  Location: [facility];  Service: Endoscopy;  Laterality: N/A;    HIATAL HERNIA REPAIR  2007; 2009   SAVORY DILATION N/A 04/01/2021   Procedure: SAVORY DILATION;  Surgeon: Meryl Dare, MD;  Location: [facility];  Service: Endoscopy;  Laterality: N/A;   TOTAL KNEE ARTHROPLASTY Left 11/03/2015   TOTAL KNEE ARTHROPLASTY Left 11/03/2015   Procedure: Left Total Knee Arthroplasty;  Surgeon: Valeria Batman, MD;  Location: [facility] OR;  Service: Orthopedics;  Laterality: Left;   TOTAL KNEE ARTHROPLASTY Right 07/17/2018   Procedure: RIGHT  TOTAL KNEE ARTHROPLASTY;  Surgeon: Valeria Batman, MD;  Location: [facility] OR;  Service: Orthopedics;  Laterality: Right;   VIDEO BRONCHOSCOPY Bilateral 01/31/2013   Procedure: VIDEO BRONCHOSCOPY WITHOUT FLUORO;  Surgeon: Merwyn Katos, MD;  Location: [facility] ENDOSCOPY;  Service: Cardiopulmonary;  Laterality: Bilateral;   Social History   Occupational History   Not on file  Tobacco Use   Smoking status: Never   Smokeless tobacco: Never  Vaping Use   Vaping Use: Never used  Substance and Sexual Activity   Alcohol use: No   Drug use: No   Sexual activity: Not on file

## 2021-06-17 ENCOUNTER — Ambulatory Visit: Payer: Medicare Other

## 2021-06-17 ENCOUNTER — Encounter: Payer: Self-pay | Admitting: Orthopaedic Surgery

## 2021-06-17 DIAGNOSIS — M79604 Pain in right leg: Secondary | ICD-10-CM

## 2021-06-17 DIAGNOSIS — M6281 Muscle weakness (generalized): Secondary | ICD-10-CM | POA: Diagnosis not present

## 2021-06-17 DIAGNOSIS — M6289 Other specified disorders of muscle: Secondary | ICD-10-CM

## 2021-06-17 DIAGNOSIS — R262 Difficulty in walking, not elsewhere classified: Secondary | ICD-10-CM

## 2021-06-17 NOTE — Progress Notes (Signed)
Called and discussed rersults

## 2021-06-17 NOTE — Therapy (Signed)
[facility] @ [address] [place], [place], [address] Phone: [phone]   Fax:  [phone]  Physical Therapy Discharge Summary  Patient Details  Name: Alan Roman MRN: [id number] Date of Birth: 29-Mar-1946 Referring Provider (PT): Dr Joni Fears   Encounter Date: 06/17/2021   PT End of Session - 06/17/21 0855     Visit Number 6    Date for PT Re-Evaluation 07/02/21    Authorization Type BC/BS Medicare    PT Start Time 6378    PT Stop Time 0923    PT Time Calculation (min) 37 min    Activity Tolerance Patient tolerated treatment well    Behavior During Therapy [facility] for tasks assessed/performed             Past Medical History:  Diagnosis Date   Acute bronchiolitis due to other infectious organisms 01/26/2013   Arthritis    "knees" (11/03/2015)   Extrapulmonary TB (tuberculosis) 02/02/2013   antibx therapy in 2014   GERD (gastroesophageal reflux disease)    on meds   High risk medication use 12/27/2016   History of hiatal hernia 2007; 2009   Hyperlipidemia    on meds   Hypertension    on meds   Pleural effusion 01/26/2013    Past Surgical History:  Procedure Laterality Date   BIOPSY  04/01/2021   Procedure: BIOPSY;  Surgeon: Ladene Artist, MD;  Location: [facility];  Service: Endoscopy;;   CATARACT EXTRACTION W/ INTRAOCULAR LENS  IMPLANT, BILATERAL Bilateral    ESOPHAGOGASTRODUODENOSCOPY (EGD) WITH PROPOFOL N/A 04/01/2021   Procedure: ESOPHAGOGASTRODUODENOSCOPY (EGD) WITH PROPOFOL;  Surgeon: Ladene Artist, MD;  Location: [facility];  Service: Endoscopy;  Laterality: N/A;   HIATAL HERNIA REPAIR  2007; 2009   SAVORY DILATION N/A 04/01/2021   Procedure: SAVORY DILATION;  Surgeon: Ladene Artist, MD;  Location: [facility];  Service: Endoscopy;  Laterality: N/A;   TOTAL KNEE ARTHROPLASTY Left 11/03/2015   TOTAL KNEE ARTHROPLASTY Left 11/03/2015   Procedure: Left Total Knee Arthroplasty;   Surgeon: Garald Balding, MD;  Location: [facility];  Service: Orthopedics;  Laterality: Left;   TOTAL KNEE ARTHROPLASTY Right 07/17/2018   Procedure: RIGHT TOTAL KNEE ARTHROPLASTY;  Surgeon: Garald Balding, MD;  Location: [facility];  Service: Orthopedics;  Laterality: Right;   VIDEO BRONCHOSCOPY Bilateral 01/31/2013   Procedure: VIDEO BRONCHOSCOPY WITHOUT FLUORO;  Surgeon: Wilhelmina Mcardle, MD;  Location: [facility] ENDOSCOPY;  Service: Cardiopulmonary;  Laterality: Bilateral;    There were no vitals filed for this visit.   Subjective Assessment - 06/17/21 0845     Subjective Patient reports he is doing well.  He saw MD yesterday.  MRI ordered for lower right leg pain.  Had MRI last night at 5:30 pm.  Should hear from MD today regarding results.  He will be going out of country to [place] to visit with family for the next 2 months.  He would like to resume PT if pain persists when he returns but feels he has enough exercises to work on while he is gone to keep him from having any significant setbacks.    Pertinent History B TKA    Limitations Standing    How long can you stand comfortably? 1-2 min    How long can you walk comfortably? 25-30 min    Diagnostic tests Had MRI last night 06-16-21.  No results yet.    Patient Stated Goals I want to stop that pain.  Currently in Pain? No/denies    Pain Score 0-No pain   Increases when he is cold or after sitting for prolonged period of time   Pain Orientation Right    Pain Descriptors / Indicators Discomfort;Nagging    Pain Type Acute pain    Pain Onset 1 to 4 weeks ago    Pain Frequency Intermittent                [facility] PT Assessment - 06/17/21 0001       ROM / Strength   AROM / PROM / Strength Strength      Strength   Overall Strength Comments RLE strength grossly 4+/5      Flexibility   Soft Tissue Assessment /Muscle Length yes    Hamstrings improved flexibility: decreased complaints of "feeling tight" during hamstring stretches    ITB  less discomfort with stretches per patient    Piriformis less discomfort with stretches per patient      Palpation   Palpation comment Pt with decreased trigger points and muscle tightness noted over R lumbar paraspinals but continued tightness right LE peroneals with trigger point      Transfers   Five time sit to stand comments  unable to test but patient able to come to stand without UE support                           [facility] Adult PT Treatment/Exercise - 06/17/21 0001       Ambulation/Gait   Ambulation/Gait Yes    Ambulation/Gait Assistance 7: Independent    Ambulation Distance (Feet) 100 Feet    Assistive device None    Gait Pattern Step-through pattern;Scissoring    Ambulation Surface Level;Indoor      Knee/Hip Exercises: [employer] operational officer Both;3 reps;30 seconds    Active Hamstring Stretch Limitations standing foot in chair, holding onto hip cybex bar    Gastroc Stretch Both;2 reps;20 seconds      Knee/Hip Exercises: Aerobic   Nustep L5 x5 min.  PT present to discuss pt progress.      Knee/Hip Exercises: Machines for Strengthening   Cybex Leg Press 70# 2x10, seat at 7      Knee/Hip Exercises: Standing   Forward Lunges Both;2 sets;10 reps    Forward Lunges Limitations onto bosu    Rocker Board 1 minute    Rocker Board Limitations fwd/back and side/side each    SLS x 5 hold 10 sec      Manual Therapy   Manual Therapy Soft tissue mobilization;Myofascial release    Manual therapy comments in sitting    Soft tissue mobilization STM Rt lateral gastroc, soleus, Rt peroneals    Myofascial Release trigger point release to R peroneal                     PT Education - 06/17/21 0853     Education Details Increase walking distance as tolerated    Person(s) Educated Patient    Methods Explanation    Comprehension Verbalized understanding              PT Short Term Goals - 06/10/21 1230       PT SHORT TERM GOAL #1    Title Pt will be independent with initial HEP.    Status Achieved               PT Long Term Goals - 06/17/21 7711  PT LONG TERM GOAL #1   Title Pt will be independent with advanced HEP.    Time 6    Period Weeks    Status Achieved      PT LONG TERM GOAL #2   Title Pt will increase FOTO to 70% to indicate improved functional mobility.    Baseline -3 to 125    Time 6    Period Weeks    Status Unable to assess      PT LONG TERM GOAL #3   Title Pt will increase R LE strength to at least 4+/5 to allow him to negoiate steps with ease.    Time 6    Period Weeks    Status Achieved      PT LONG TERM GOAL #4   Title Pt will report pain no greater than 3/10 at night to allow him to sleep.    Baseline occasional pain in knee if it gets cold     Time 6    Period Weeks    Status On-going      PT LONG TERM GOAL #5   Title improve FOTO =/< 41% limited ( 09/19/2018)     Time 4    Period Weeks    Status Unable to assess                   Plan - 06/17/21 0855     Clinical Impression Statement Patient reports min pain today at start of session.  He did have MRI last night and will hear from that today.  He was able to tolerate all exercises today without increased pain.  He demonstrates good form with walking today with min verbal cues for wider base of support and avoiding slight scissoring.  Patient will be out of country for 2 months visiting family.  He would like to resume upon returning.    Personal Factors and Comorbidities Comorbidity 1    Comorbidities B TKA    Examination-Activity Limitations Locomotion Level;Squat;Stand    Examination-Participation Restrictions Community Activity    Stability/Clinical Decision Making Stable/Uncomplicated    Clinical Decision Making Low    Rehab Potential Good    PT Frequency 2x / week    PT Duration 6 weeks    PT Treatment/Interventions ADLs/Self Care Home Management;Aquatic Therapy;Cryotherapy;Electrical  Stimulation;Iontophoresis 4mg /ml Dexamethasone;Moist Heat;Traction;Ultrasound;Gait training;Stair training;Functional mobility training;Therapeutic activities;Therapeutic exercise;Balance training;Neuromuscular re-education;Patient/family education;Manual techniques;Passive range of motion;Dry needling;Taping;Vasopneumatic Device;Spinal Manipulations;Joint Manipulations    PT Next Visit Plan Patient will be out of country for 2 months.  He would like to resume PT upon returning if pain persists.    PT Home Exercise Plan Access Code [email or web address]    Consulted and Agree with Plan of Care Patient             Patient will benefit from skilled therapeutic intervention in order to improve the following deficits and impairments:  Difficulty walking, Increased muscle spasms, Pain, Impaired flexibility, Decreased strength  Visit Diagnosis: Muscle weakness (generalized)  Difficulty in walking, not elsewhere classified  Pain in right leg  Hamstring tightness     Problem List Patient Active Problem List   Diagnosis Date Noted   Pain in right leg 05/26/2021   Dysphagia    Gastroesophageal reflux disease    Esophageal stricture    At risk for aspiration 08/07/2019   ILD (interstitial lung disease) ([place]) 06/03/2019   Elevated diaphragm 06/03/2019   Dyspnea on exertion 05/06/2019   Vocal cord atrophy 05/06/2019   History of  total right knee replacement 07/17/2018   Primary osteoarthritis of right knee 05/31/2018   History of tuberculosis 11/05/2015   Orthostatic hypotension 11/05/2015   Constipation 11/05/2015   Thrombocytopenia ([place]) 11/05/2015   Normocytic anemia 11/05/2015   Psoriatic arthritis ([place]) 11/03/2015   Essential hypertension 11/03/2015   Osteoarthritis of left knee 11/03/2015   Status post total knee replacement, left 11/03/2015   Psoriasis 01/23/2013   Immunocompromised state ([place]) 01/23/2013  PHYSICAL THERAPY DISCHARGE SUMMARY  Visits from Start of Care: 6  Current  functional level related to goals / functional outcomes: See clinical impressions above   Remaining deficits: As above   Education / Equipment: HEP   Patient agrees to discharge. Patient goals were met. Patient is being discharged due to the patient's request.   Anderson Malta B. Arelene Moroni, PT 06/17/2210:23 AM   [facility] @ [address] [place], [place], [address] Phone: [phone]   Fax:  [phone]  Name: Alan Roman MRN: [id number] Date of Birth: November 26, 1945

## 2021-06-17 NOTE — Patient Instructions (Signed)
Continue HEP, encouraged walking program

## 2021-06-18 ENCOUNTER — Encounter: Payer: Medicare Other | Admitting: Rehabilitative and Restorative Service Providers"

## 2021-06-23 MED ORDER — PIRFENIDONE 267 MG PO TABS: 801.0000 mg | ORAL_TABLET | Freq: Three times a day (TID) | ORAL | 4 refills | Status: DC

## 2021-06-23 MED ORDER — PIRFENIDONE 267 MG PO TABS: ORAL_TABLET | ORAL | 0 refills | Status: DC

## 2021-06-23 NOTE — Telephone Encounter (Signed)
Received a fax from [place] regarding an approval for Esbriet patient assistance from 06/22/2021 until patient no longer qualifies due to discontinuation of therapy, changes to patient's current financial status or health insurance and/or patient no longer meets the program eligibility requirements.   ATC but could not LVM due to full mailbox. Sent MyChart message providing update and explained next steps to take if they have not already been contacted by either [employer] or [employer]. Phone numbers to both provided. Requested that pt reach out to [place] here at the clinic with any additional questions or concerns and explained that [facility] will also be reaching out to go over clinical information regarding medication.  Relevant Documents have been sent to scan center. Nothing further required at this time.  [employer] Phone#: [phone] option 5 [employer] Phone#: [phone]

## 2021-06-23 NOTE — Telephone Encounter (Signed)
Routing to [place] for clinical f/u. 

## 2021-06-23 NOTE — Telephone Encounter (Addendum)
ATC patient to review approval since he will be going out of town. Unable to reach.  Spoke with his daughter, [name]. She spoke with [employer] and [employer] but they will be unable to get Esbriet to ger until the week of December 12th. She is flying to [place] the week before. Patient is flying to [place] on 06/29/21.  They will plan to get pirfenidone while in [place] - sent MyChart message with dosing regimen for first month.  Reviewed that Esbriet is not immunosuppressive. Reviewed potential for worsening of GERD, nausea. She has already bought sunscreen for patient to wear while in [place]. She will reiterate to patient about taking medication with food/snacks to help mitigate nausea.  [name] will plan to set up shipment to her home when she gets back from [place] in January  Chesley Mires, PharmD, MPH, BCPS Clinical Pharmacist (Rheumatology and Pulmonology)

## 2021-07-27 ENCOUNTER — Other Ambulatory Visit: Payer: Self-pay

## 2021-07-27 ENCOUNTER — Telehealth (INDEPENDENT_AMBULATORY_CARE_PROVIDER_SITE_OTHER): Payer: Medicare Other | Admitting: Nurse Practitioner

## 2021-07-27 DIAGNOSIS — J84112 Idiopathic pulmonary fibrosis: Secondary | ICD-10-CM

## 2021-07-27 NOTE — Progress Notes (Deleted)
Patient ID: Alan Roman, male     DOB: [birth year], 75 y.o.      MRN: [id number]  No chief complaint on file.   Virtual Visit via Video Note  I connected with [facility] on 07/27/21 at  9:00 AM EST by a video enabled telemedicine application and verified that I am speaking with the correct person using two identifiers.  Location: Patient: Home Provider: Office   I discussed the limitations of evaluation and management by telemedicine and the availability of in person appointments. The patient expressed understanding and agreed to proceed.  History of Present Illness: 75 year old male never smoker followed for idiopathic pulmonary fibrosis and vocal cord paralysis.  He is a patient of Dr. Golden Pop and was last seen in office on 06/15/2021.  06/15/2021: OV with Dr. Chase Caller.  Minimal symptoms with improvement in symptom score.  PFTs with slight worsening compared to 2 years ago; however patient reported not feeling this.  Switched from Ofev to pirfenidone. Intolerance to Ofev due to HTN. Awaiting pirfenidone shipment. Plans to travel to [place] from Nov 2022 to February 2023 - plans to establish pulmonologist in [place] for monitoring of Esbret.   Allergies  Allergen Reactions   Ofev [Nintedanib] Hypertension   Immunization History  Administered Date(s) Administered   Fluad Quad(high Dose 65+) 04/20/2021   Influenza, High Dose Seasonal PF 04/22/2019   PPD Test 04/06/2010   Tdap 05/30/2017   Past Medical History:  Diagnosis Date   Acute bronchiolitis due to other infectious organisms 01/26/2013   Arthritis    "knees" (11/03/2015)   Extrapulmonary TB (tuberculosis) 02/02/2013   antibx therapy in 2014   GERD (gastroesophageal reflux disease)    on meds   High risk medication use 12/27/2016   History of hiatal hernia 2007; 2009   Hyperlipidemia    on meds   Hypertension    on meds   Pleural effusion 01/26/2013    Tobacco History: Social History    Tobacco Use  Smoking Status Never  Smokeless Tobacco Never   Counseling given: Not Answered   Outpatient Medications Prior to Visit  Medication Sig Dispense Refill   aspirin (ASPIRIN CHILDRENS) 81 MG chewable tablet Chew 1 tablet (81 mg total) by mouth daily.     clobetasol cream (TEMOVATE) [id number] % Apply 1 application topically 2 (two) times daily. (Patient taking differently: Apply 1 application topically daily as needed (dry amd itching skin).) 45 g 1   losartan (COZAAR) 50 MG tablet Take 1 tablet (50 mg total) by mouth daily. 90 tablet 1   methylPREDNISolone (MEDROL DOSEPAK) 4 MG TBPK tablet Take as directed 21 tablet 0   multivitamin-iron-minerals-folic acid (CENTRUM) chewable tablet Chew 1 tablet by mouth daily.     omeprazole (PRILOSEC) 20 MG capsule TAKE 1 CAPSULE(20 MG) BY MOUTH DAILY 90 capsule 1   Pirfenidone (ESBRIET) 267 MG TABS Take 1 tab three times daily for 7 days, then 2 tabs three times daily for 7 days, then 3 tabs three times daily thereafter. 207 tablet 0   Pirfenidone (ESBRIET) 267 MG TABS Take 3 tablets (801 mg total) by mouth 3 (three) times daily. 180 tablet 4   rosuvastatin (CRESTOR) 5 MG tablet Take 1 tablet (5 mg total) by mouth daily. 90 tablet 3   STELARA 45 MG/0.5ML SOSY injection Inject 45 mg into the skin every 3 (three) months.     traMADol (ULTRAM) 50 MG tablet Take 1 tablet (50 mg total) by mouth every 6 (six) hours as needed.  30 tablet 0   Triamcinolone Acetonide (TRIAMCINOLONE 0.1 % CREAM : EUCERIN) CREA Apply 1 application topically 2 (two) times daily. 1 each 0   No facility-administered medications prior to visit.     Review of Systems:   Constitutional: No weight loss or gain, night sweats, fevers, chills, fatigue, or lassitude. HEENT: No headaches, difficulty swallowing, tooth/dental problems, or sore throat. No sneezing, itching, ear ache, nasal congestion, or post nasal drip CV:  No chest pain, orthopnea, PND, swelling in lower extremities,  anasarca, dizziness, palpitations, syncope Resp: No shortness of breath with exertion or at rest. No excess mucus or change in color of mucus. No productive or non-productive. No hemoptysis. No wheezing.  No chest wall deformity GI:  No heartburn, indigestion, abdominal pain, nausea, vomiting, diarrhea, change in bowel habits, loss of appetite, bloody stools.  GU: No dysuria, change in color of urine, urgency or frequency.  No flank pain, no hematuria  Skin: No rash, lesions, ulcerations MSK:  No joint pain or swelling.  No decreased range of motion.  No back pain. Neuro: No dizziness or lightheadedness.  Psych: No depression or anxiety. Mood stable.   Observations/Objective:   Assessment and Plan: No problem-specific Assessment & Plan notes found for this encounter.    Follow Up Instructions:    I discussed the assessment and treatment plan with the patient. The patient was provided an opportunity to ask questions and all were answered. The patient agreed with the plan and demonstrated an understanding of the instructions.   The patient was advised to call back or seek an in-person evaluation if the symptoms worsen or if the condition fails to improve as anticipated.  I provided *** minutes of non-face-to-face time during this encounter.   Noemi Chapel, NP

## 2021-07-28 NOTE — Progress Notes (Signed)
Pt no show

## 2021-09-16 ENCOUNTER — Other Ambulatory Visit: Payer: Self-pay | Admitting: Cardiology

## 2021-09-16 DIAGNOSIS — E785 Hyperlipidemia, unspecified: Secondary | ICD-10-CM

## 2021-09-16 DIAGNOSIS — I2510 Atherosclerotic heart disease of native coronary artery without angina pectoris: Secondary | ICD-10-CM

## 2021-10-25 ENCOUNTER — Telehealth: Payer: Self-pay | Admitting: Pharmacist

## 2021-10-25 NOTE — Telephone Encounter (Signed)
Patient called stating he as been testing himself for COVID for past several days and tests were negative. He tested this morning and positive was light. His PCP prescribed him molnupiravir. Patient inquiring if he can take with his Esbriet.  Advised patient that there is no interaction with his Esbriet or his other medications. ? ?He verbalized understanding. States he is not having many symptoms and continues to remain masked everywhere he goes.  ? ?Knox Saliva, PharmD, MPH, BCPS ?Clinical Pharmacist (Rheumatology and Pulmonology) ?

## 2021-12-10 ENCOUNTER — Other Ambulatory Visit: Payer: Self-pay | Admitting: Cardiology

## 2021-12-10 DIAGNOSIS — E785 Hyperlipidemia, unspecified: Secondary | ICD-10-CM

## 2021-12-10 DIAGNOSIS — I2510 Atherosclerotic heart disease of native coronary artery without angina pectoris: Secondary | ICD-10-CM

## 2021-12-20 ENCOUNTER — Telehealth: Payer: Self-pay

## 2021-12-20 NOTE — Progress Notes (Unsigned)
Primary Physician/Referring:  Glenda Chroman, MD  Patient ID: Alan Roman, male    DOB: [birth year], 76 y.o.   MRN: [id number]  No chief complaint on file.  HPI:    Alan Roman  is a 76 y.o. Asian [place] male with history of hypertension, psoriatic arthritis and follows Dr. Bo Merino, very mild hyperlipidemia and coronary calcificaiton by CT chest, recent diagnosis of IPF in Dec 2020, hypertension. He has been started on Ofev since 01/11/2021. Having mild side effects with headache and mild abdominal discomfort.  Due to recent high-resolution CT scan done on 11/23/2020 revealing left main and two-vessel coronary calcification and aortic atherosclerosis, in view of underlying dyspnea, he underwent echocardiogram and stress test.  Echocardiogram revealed mild to moderate TR, normal LVEF and otherwise no significant abnormalities.  Stress test was overall low risk.    Patient was last seen in our office 04/2021 for precordial pain suggestive of musculoskeletal etiology and advised to follow-up as needed.  He now presents for urgent visit at his request with complaints of chest pain. ***  ***  Patient was seen in our office 02/05/2021 by Dr. Einar Gip who advised as needed follow-up with our office.  Patient now presents for urgent visit with complaints of chest pain.  Patient states over the last 2 days he has had intermittent right-sided chest pain, mildly improved with use of Vick's vapor rub.  This pain is worse with palpation as well as moving his right arm.  He has also noticed over the last several weeks his blood pressure has been elevated at home.  Dyspnea on exertion remains stable.  Denies palpitations, syncope, near syncope, dizziness, orthopnea, leg swelling.  Notably patient has upcoming appointment with PCP in November.  Past Medical History:  Diagnosis Date   Acute bronchiolitis due to other infectious organisms 01/26/2013   Arthritis    "knees" (11/03/2015)    Extrapulmonary TB (tuberculosis) 02/02/2013   antibx therapy in 2014   GERD (gastroesophageal reflux disease)    on meds   High risk medication use 12/27/2016   History of hiatal hernia 2007; 2009   Hyperlipidemia    on meds   Hypertension    on meds   Pleural effusion 01/26/2013   Family History  Problem Relation Age of Onset   Colon cancer Neg Hx    Stomach cancer Neg Hx    Esophageal cancer Neg Hx    Colon polyps Neg Hx    Rectal cancer Neg Hx    Past Surgical History:  Procedure Laterality Date   BIOPSY  04/01/2021   Procedure: BIOPSY;  Surgeon: Ladene Artist, MD;  Location: [facility];  Service: Endoscopy;;   CATARACT EXTRACTION W/ INTRAOCULAR LENS  IMPLANT, BILATERAL Bilateral    ESOPHAGOGASTRODUODENOSCOPY (EGD) WITH PROPOFOL N/A 04/01/2021   Procedure: ESOPHAGOGASTRODUODENOSCOPY (EGD) WITH PROPOFOL;  Surgeon: Ladene Artist, MD;  Location: [facility];  Service: Endoscopy;  Laterality: N/A;   HIATAL HERNIA REPAIR  2007; 2009   SAVORY DILATION N/A 04/01/2021   Procedure: SAVORY DILATION;  Surgeon: Ladene Artist, MD;  Location: [facility];  Service: Endoscopy;  Laterality: N/A;   TOTAL KNEE ARTHROPLASTY Left 11/03/2015   TOTAL KNEE ARTHROPLASTY Left 11/03/2015   Procedure: Left Total Knee Arthroplasty;  Surgeon: Garald Balding, MD;  Location: [place];  Service: Orthopedics;  Laterality: Left;   TOTAL KNEE ARTHROPLASTY Right 07/17/2018   Procedure: RIGHT TOTAL KNEE ARTHROPLASTY;  Surgeon: Garald Balding, MD;  Location: [place];  Service:  Orthopedics;  Laterality: Right;   VIDEO BRONCHOSCOPY Bilateral 01/31/2013   Procedure: VIDEO BRONCHOSCOPY WITHOUT FLUORO;  Surgeon: Wilhelmina Mcardle, MD;  Location: [facility];  Service: Cardiopulmonary;  Laterality: Bilateral;   Social History   Tobacco Use   Smoking status: Never   Smokeless tobacco: Never  Substance Use Topics   Alcohol use: No    ROS  Review of Systems  Constitutional: Negative for malaise/fatigue  and weight gain.  Cardiovascular:  Positive for chest pain (right-sided) and dyspnea on exertion (stable). Negative for claudication, leg swelling, near-syncope, orthopnea, palpitations, paroxysmal nocturnal dyspnea and syncope.  Respiratory:  Negative for cough and shortness of breath.   Musculoskeletal:  Positive for joint pain.  Gastrointestinal:  Negative for melena.  Neurological:  Negative for dizziness.   Objective  There were no vitals taken for this visit.     06/15/2021    1:51 PM 06/07/2021   10:16 AM 04/27/2021   12:53 PM  Vitals with BMI  Height 5\' 7"  5\' 7"    Weight 158 lbs 10 oz 156 lbs 6 oz   BMI 0000000 [id number]   Systolic [id number]  Diastolic 80 68 82  Pulse 76 79 50     Physical Exam Vitals reviewed.  Constitutional:      Appearance: He is normal weight.  HENT:     Head: Normocephalic and atraumatic.  Neck:     Thyroid: No thyromegaly.     Vascular: No carotid bruit or JVD.  Cardiovascular:     Rate and Rhythm: Normal rate and regular rhythm.     Pulses: Normal pulses and intact distal pulses.     Heart sounds: Normal heart sounds, S1 normal and S2 normal. No murmur heard.   No gallop.  Pulmonary:     Effort: Pulmonary effort is normal. No respiratory distress.     Breath sounds: Rales (coarse bibasilar ronchi present) present. No wheezing or rhonchi.  Chest:    Musculoskeletal:     Cervical back: Neck supple.     Right lower leg: No edema.     Left lower leg: No edema.  Skin:    General: Skin is warm and dry.  Neurological:     Mental Status: He is alert.   Laboratory examination:   Recent Labs    12/29/20 1402  NA 139  K 4.1  CL 105  CO2 27  GLUCOSE 127*  BUN 17  CREATININE 1.09  CALCIUM 8.6    CrCl cannot be calculated (Patient's most recent lab result is older than the maximum 21 days allowed.).     Latest Ref Rng & Units 04/20/2021    3:03 PM 12/29/2020    2:02 PM 05/21/2020   10:28 AM  CMP  Glucose 70 - 99 mg/dL  127   83     BUN 6 - 23 mg/dL  17   17    Creatinine 0.40 - 1.50 mg/dL  1.09   1.11    Sodium 135 - 145 mEq/L  139   141    Potassium 3.5 - 5.1 mEq/L  4.1   4.2    Chloride 96 - 112 mEq/L  105   106    CO2 19 - 32 mEq/L  27   27    Calcium 8.4 - 10.5 mg/dL  8.6   8.6    Total Protein 6.0 - 8.3 g/dL 7.8   7.3   6.9    Total Bilirubin 0.2 - 1.2 mg/dL 0.6  0.5   1.0    Alkaline Phos 39 - 117 U/L 76   77   76    AST 0 - 37 U/L 27   27   35    ALT 0 - 53 U/L 17   18   25         Latest Ref Rng & Units 12/29/2020    2:02 PM 05/21/2020   10:28 AM 02/27/2020   10:08 AM  CBC  WBC 4.0 - 10.5 K/uL 5.6   5.1   6.0    Hemoglobin 13.0 - 17.0 g/dL 13.6   13.1   14.1    Hematocrit 39.0 - 52.0 % 38.9   39.6   41.3    Platelets 150.0 - 400.0 K/uL 156.0   161   180      External labs:  Cholesterol, total 181.000 M 06/24/2019 HDL 46.000 M 06/24/2019 LDL 117 Triglycerides 92.000 M 06/24/2019  Hemoglobin 15.000 G/ 06/24/2019. Platelets 151.000 X 06/24/2019 Creatinine, Serum 1.060 MG/ 06/24/2019 Potassium 4.400 06/07/2019  ALT (SGPT) 18.000 IU/ 06/24/2019 TSH 1.980 06/24/2019  Allergies   Allergies  Allergen Reactions   Ofev [Nintedanib] Hypertension    Medications Prior to Visit:   Outpatient Medications Prior to Visit  Medication Sig Dispense Refill   aspirin (ASPIRIN CHILDRENS) 81 MG chewable tablet Chew 1 tablet (81 mg total) by mouth daily.     clobetasol cream (TEMOVATE) [id number] % Apply 1 application topically 2 (two) times daily. (Patient taking differently: Apply 1 application topically daily as needed (dry amd itching skin).) 45 g 1   losartan (COZAAR) 50 MG tablet Take 1 tablet (50 mg total) by mouth daily. 90 tablet 1   methylPREDNISolone (MEDROL DOSEPAK) 4 MG TBPK tablet Take as directed 21 tablet 0   multivitamin-iron-minerals-folic acid (CENTRUM) chewable tablet Chew 1 tablet by mouth daily.     omeprazole (PRILOSEC) 20 MG capsule TAKE 1 CAPSULE(20 MG) BY MOUTH DAILY 90 capsule 1    Pirfenidone (ESBRIET) 267 MG TABS Take 1 tab three times daily for 7 days, then 2 tabs three times daily for 7 days, then 3 tabs three times daily thereafter. 207 tablet 0   Pirfenidone (ESBRIET) 267 MG TABS Take 3 tablets (801 mg total) by mouth 3 (three) times daily. 180 tablet 4   rosuvastatin (CRESTOR) 5 MG tablet TAKE 1 TABLET(5 MG) BY MOUTH DAILY 90 tablet 3   STELARA 45 MG/0.5ML SOSY injection Inject 45 mg into the skin every 3 (three) months.     traMADol (ULTRAM) 50 MG tablet Take 1 tablet (50 mg total) by mouth every 6 (six) hours as needed. 30 tablet 0   Triamcinolone Acetonide (TRIAMCINOLONE 0.1 % CREAM : EUCERIN) CREA Apply 1 application topically 2 (two) times daily. 1 each 0   No facility-administered medications prior to visit.   Final Medications at End of Visit    No outpatient medications have been marked as taking for the 12/21/21 encounter (Appointment) with Rayetta Pigg, Renetta Suman C, PA-C.   Radiology:   Barium swallow study 06/07/2019: Schatzki ring at the GE junction, which prevents passage of a 13 mm barium tablet. No evidence of hiatal hernia or gastroesophageal reflux.  High-resolution CT scan of the chest 11/23/2020:  1. The appearance of the lungs is compatible with interstitial lung disease, with a spectrum of findings once again categorized as probable usual interstitial pneumonia (UIP) per current ATS guidelines. Minimal progression of disease compared to the prior examination. 2. Aortic atherosclerosis, in addition to  left main and 2 vessel coronary artery disease. Assessment for potential risk factor modification, dietary therapy or pharmacologic therapy may be warranted, if clinically indicated. 3. Cholelithiasis.  Cardiac Studies:   Lexiscan Tetrofosmin Stress Test  12/21/2020: Nondiagnostic ECG stress. Myocardial perfusion is normal. Overall LV systolic function is normal without regional wall motion abnormalities. Stress LVEF: 51%. No previous exam  available for comparison.  Echocardiogram 12/22/2020: Left ventricle cavity is normal in size. Mild concentric hypertrophy of the left ventricle. Normal global wall motion. Normal LV systolic function with EF 63%. Doppler evidence of grade I (impaired) diastolic dysfunction, normal LAP.  Left atrial cavity is normal in size. Aneurysmal interatrial septum without 2D or color Doppler evidence of shunting. Trileaflet aortic valve.  Mild (Grade I) aortic regurgitation. Mild aortic valve leaflet calcification. Mild (Grade I) mitral regurgitation. Mild to moderate tricuspid regurgitation. Estimated pulmonary artery systolic pressure 30 mmHg.  Mild pulmonic regurgitation.   EKG  ***   04/27/2021: Sinus bradycardia rate of 50 bpm.  Left atrial enlargement.  Normal axis.  Low voltage complexes, probably pulmonary disease pattern.  Nonspecific T wave abnormality.  Compared to EKG 09/25/2020, no significant change.  EKG 09/25/2020: Normal sinus rhythm at rate of 69 bpm, left atrial enlargement, normal axis.  Low-voltage complexes.  No evidence of ischemia.  Probable pulmonary disease pattern.   EKG 09/06/2019: Normal sinus rhythm at rate of 62 bpm, left axis deviation, left intrafascicular block. IRBBB. No evidence of ischemia. No significant change from  Prior EKG.   Assessment   No diagnosis found.   No orders of the defined types were placed in this encounter.   There are no discontinued medications.   No orders of the defined types were placed in this encounter.    Recommendations:   Gerome Hayslett  is a 76 y.o. Asian [place] male with history of hypertension, psoriatic arthritis and follows Dr. Bo Merino, very mild hyperlipidemia and coronary calcificaiton by CT chest, recent diagnosis of IPF in Dec 2020, hypertension. He has been started on Ofev since 01/11/2021. Having mild side effects with headache and mild abdominal discomfort.  Due to recent high-resolution CT scan done on  11/23/2020 revealing left main and two-vessel coronary calcification and aortic atherosclerosis, in view of underlying dyspnea, he underwent echocardiogram and stress test.  Echocardiogram revealed mild to moderate TR, normal LVEF and otherwise no significant abnormalities.  Stress test was overall low risk.    Patient was last seen in our office 04/2021 for precordial pain suggestive of musculoskeletal etiology and advised to follow-up as needed.  He now presents for urgent visit at his request with complaints of chest pain. ***  ***  Due to recent high-resolution CT scan done on 11/23/2020 revealing left main and two-vessel coronary calcification and aortic atherosclerosis, in view of underlying dyspnea, he underwent echocardiogram and stress test.  Echocardiogram revealed mild to moderate TR, normal LVEF and otherwise no significant abnormalities.  Stress test was overall low risk.  Patient was seen in our office 02/05/2021 by Dr. Einar Gip who advised as needed follow-up with our office.  Patient now presents for urgent visit with complaints of chest pain.  EKG today is unchanged compared to previous without evidence of acute ischemia or injury.  Patient's physical exam is highly consistent with musculoskeletal etiology of patient's chest pain.  Advised conservative measures and follow-up with PCP, Particularly given recent unremarkable echocardiogram and low risk stress test.  In regard to hypertension, patient's blood pressure is elevated in the office  as well as on home readings.  We will increase losartan from 25 mg to 50 mg daily and repeat BMP in 1 week.  Will defer further management of hypertension to patient's PCP as he is upcoming appointment in November 2022.  Patient is otherwise stable from a cardiovascular standpoint.  We will see him back on an as-needed basis.   Alethia Berthold, PA-C 12/20/2021, 3:11 PM Office: [phone]  CC: Dr. Brand Males

## 2021-12-20 NOTE — Telephone Encounter (Signed)
I have spoken with patient, symptoms appear to be related to MSK etiology. However please call him and add him to my schedule tomorrow.

## 2021-12-21 ENCOUNTER — Ambulatory Visit: Payer: Medicare Other | Admitting: Student

## 2021-12-21 ENCOUNTER — Encounter: Payer: Self-pay | Admitting: Student

## 2021-12-21 VITALS — BP 122/74 | HR 69 | Temp 97.9°F | Resp 17 | Ht 67.0 in | Wt 157.8 lb

## 2021-12-21 DIAGNOSIS — I10 Essential (primary) hypertension: Secondary | ICD-10-CM

## 2021-12-21 DIAGNOSIS — R072 Precordial pain: Secondary | ICD-10-CM

## 2022-01-14 ENCOUNTER — Other Ambulatory Visit: Payer: Self-pay | Admitting: Student

## 2022-01-14 DIAGNOSIS — I10 Essential (primary) hypertension: Secondary | ICD-10-CM

## 2022-01-23 ENCOUNTER — Other Ambulatory Visit: Payer: Self-pay | Admitting: Student

## 2022-01-23 DIAGNOSIS — I10 Essential (primary) hypertension: Secondary | ICD-10-CM

## 2022-01-24 ENCOUNTER — Other Ambulatory Visit: Payer: Self-pay

## 2022-01-24 DIAGNOSIS — E785 Hyperlipidemia, unspecified: Secondary | ICD-10-CM

## 2022-01-24 DIAGNOSIS — I10 Essential (primary) hypertension: Secondary | ICD-10-CM

## 2022-01-24 DIAGNOSIS — I2510 Atherosclerotic heart disease of native coronary artery without angina pectoris: Secondary | ICD-10-CM

## 2022-01-24 MED ORDER — ROSUVASTATIN CALCIUM 5 MG PO TABS: ORAL_TABLET | ORAL | 3 refills | Status: DC

## 2022-01-24 MED ORDER — LOSARTAN POTASSIUM 50 MG PO TABS: 50.0000 mg | ORAL_TABLET | Freq: Every day | ORAL | 1 refills | Status: DC

## 2022-05-17 ENCOUNTER — Encounter: Payer: Self-pay | Admitting: Internal Medicine

## 2022-05-17 ENCOUNTER — Ambulatory Visit: Payer: Medicare Other | Admitting: Internal Medicine

## 2022-05-17 VITALS — BP 112/64 | HR 61 | Temp 97.8°F | Ht 67.0 in | Wt 156.4 lb

## 2022-05-17 DIAGNOSIS — Z7184 Encounter for health counseling related to travel: Secondary | ICD-10-CM | POA: Diagnosis not present

## 2022-05-17 DIAGNOSIS — Z7189 Other specified counseling: Secondary | ICD-10-CM | POA: Diagnosis not present

## 2022-05-17 DIAGNOSIS — J84112 Idiopathic pulmonary fibrosis: Secondary | ICD-10-CM

## 2022-05-17 DIAGNOSIS — Z7185 Encounter for immunization safety counseling: Secondary | ICD-10-CM | POA: Diagnosis not present

## 2022-05-17 LAB — BASIC METABOLIC PANEL
BUN: 15 mg/dL (ref 6–23)
CO2: 29 mEq/L (ref 19–32)
Calcium: 9.2 mg/dL (ref 8.4–10.5)
Chloride: 102 mEq/L (ref 96–112)
Creatinine, Ser: 1.24 mg/dL (ref 0.40–1.50)
GFR: 56.03 mL/min — ABNORMAL LOW (ref >60.00)
Glucose, Bld: 95 mg/dL (ref 70–99)
Potassium: 4.3 mEq/L (ref 3.5–5.1)
Sodium: 136 mEq/L (ref 135–145)

## 2022-05-17 LAB — HEPATIC FUNCTION PANEL
ALT: 27 U/L (ref 0–53)
AST: 44 U/L — ABNORMAL HIGH (ref 0–37)
Albumin: 4 g/dL (ref 3.5–5.2)
Alkaline Phosphatase: 89 U/L (ref 39–117)
Bilirubin, Direct: 0.1 mg/dL (ref 0.0–0.3)
Total Bilirubin: 0.6 mg/dL (ref 0.2–1.2)
Total Protein: 7.6 g/dL (ref 6.0–8.3)

## 2022-05-17 LAB — CBC WITH DIFFERENTIAL/PLATELET
Basophils Absolute: 0 10*3/uL (ref 0.0–0.1)
Basophils Relative: 0.5 % (ref 0.0–3.0)
Eosinophils Absolute: 0.2 10*3/uL (ref 0.0–0.7)
Eosinophils Relative: 3.4 % (ref 0.0–5.0)
HCT: 39.9 % (ref 39.0–52.0)
Hemoglobin: 13.6 g/dL (ref 13.0–17.0)
Lymphocytes Relative: 27.6 % (ref 12.0–46.0)
Lymphs Abs: 1.5 10*3/uL (ref 0.7–4.0)
MCHC: 34 g/dL (ref 30.0–36.0)
MCV: 98.3 fl (ref 78.0–100.0)
Monocytes Absolute: 0.7 10*3/uL (ref 0.1–1.0)
Monocytes Relative: 12.1 % — ABNORMAL HIGH (ref 3.0–12.0)
Neutro Abs: 3.1 10*3/uL (ref 1.4–7.7)
Neutrophils Relative %: 56.4 % (ref 43.0–77.0)
Platelets: 156 10*3/uL (ref 150.0–400.0)
RBC: 4.06 Mil/uL — ABNORMAL LOW (ref 4.22–5.81)
RDW: 12.9 % (ref 11.5–15.5)
WBC: 5.5 10*3/uL (ref 4.0–10.5)

## 2022-05-17 MED ORDER — NIRMATRELVIR/RITONAVIR (PAXLOVID)TABLET: 3.0000 | ORAL_TABLET | Freq: Two times a day (BID) | ORAL | 0 refills | Status: DC

## 2022-05-17 NOTE — Progress Notes (Signed)
NOV 2020 with Dr Vassie Loll  76 year old never smoker of [place] origin initial consult 05/2019  for evaluation of hoarseness, dry cough and congestion. He is originally from [place], [place], [place]-immigrated to the [place] in 1971 for his bachelors to [place] and then settled in [place] in 1978 and got into [employer] business.  PMH - - psoriatic arthritis by Dr. Orene Desanctis, was originally on Humira and is now on Cosentyx. - latent TB that was untreated initially and in 12/2012 he presented with a mild alveolitis on CT scan and pleural effusion that required thoracentesis, bronchoscopy was negative for AFB but he was treated under the guidance of local health department and ID with multiple drug antituberculous treatment for 3 months.  His problem lists extrapulmonary TB but on my review all AFB cultures were negative.   -07/2018 chest x-ray shows eventration of right hemidiaphragm.   - vocal cord paralysis in 04/2015 and underwent ENT evaluation for persistent dysphonia and underwent gel injection of the vocal cords at [facility] without any relief  Chief Complaint  Patient presents with   Follow-up    1 month rov- review CT chest 10/31.  pt states his s/s are unchanged since last month, prod cough with clear mucus qam that subsides throughout the day.      Symptoms are persistent. Main issues are hoarseness, dry cough and congestion  We reviewed HRCT in detail today  CT angiogram 10/2015 -which shows mild reticular densities suggestive but not diagnostic of ILD.  HRCT 05/31/19 mild, bibasilar predominant pulmonary fibrosis in a pattern featuring subtle subpleural reticular interstitial opacity, mild traction bronchiectasis and occasional areas of Bronchiolectasis - ' probable UIP ' - slight worse   OV [name] Jan 2021  76 year old male never smoker presenting to our office today after completing pulmonary function testing.  Patient was last seen in our office in  October/2020 for initial consult with Dr. Vassie Loll.  At that office visit patient was scheduled for pulmonary function testing, lab work was completed to further evaluate patient's probable UIP on October/2020 high-resolution CT chest, patient was referred to ear nose and throat, swallow studies were also ordered on the patient.  Patient completed ENT referral in 07/02/2019 with Dr. Merril Abbe Recommendations for voice therapy, that he follow-up with gastroenterology regarding this Schatzki's ring.  Follow-up in April.  Proceed forward with pulmonary pulmonary function test.  Patient reports that he is received a telephone call to restart voice rehab but he has not done this yet as he wanted to review pulmonary function testing with our office first.  Patient swallow studies are listed below:  06/06/2019-swallow study-mild aspiration risk, no treatment recommended at this time 06/07/2019-barium swallow-Schatzki ring at the GE junction which prevents passage of a 13 mm barium tablet  Patient has a gastroenterologist Dr. Loreta Ave but he has not seen her in over a year.  Patient has no scheduled upcoming appointments.  Patient continues to follow-up with rheumatology, Dr. Corliss Skains.  Per chart review last office note was in July/2020.  Patient presenting today to review December/2020 pulmonary function test.  Those results are listed below:  07/19/2019-pulmonary function test-FVC 2.32 (63% predicted), postbronchodilator ratio 93, postbronchodilator FEV1 1.93 (69% predicted), DLCO 13.59 (43% predicted) >>>Severe restriction, moderately severe diffusion defect  Lab work from November/2020 are also listed below:  06/03/2019-ANA-1: 40, nuclear homogenous 06/03/2019-rheumatoid factor-14 06/03/2019-anti-Jo 1-less than 0.2 06/03/2019-aldolase-4.9 06/03/2019-centromere antibodies-negative 06/03/2019-antiscleroderma antibody-less than 1 - 06/03/2019-Sjogren's syndrome 8 and B-negative 06/03/2019-hypersensitivity  pneumonitis-negative 06/03/2019-sed rate 22  Mildly elevated ANA and rheumatoid factor.  Felt to be not clinically significant.  Rest of results are negative.  Patient continues to report that he is asymptomatic.  Patient tolerates walk in office without any oxygen desaturations.  He does continue to have occasional bouts of congestion which is believed to be related to the Cosentyx.  Family has messaged rheumatology regarding this.  Tests:   07/19/2019-pulmonary function test-FVC 2.32 (63% predicted), postbronchodilator ratio 93, postbronchodilator FEV1 1.93 (69% predicted), DLCO 13.59 (43% predicted) >>>Severe restriction, moderately severe diffusion defect  07/16/2019-SARS-CoV-2-not detected  06/03/2019-ANA-1: 40, nuclear homogenous 06/03/2019-rheumatoid factor-14 06/03/2019-anti-Jo 1-less than 0.2 06/03/2019-aldolase-4.9 06/03/2019-centromere antibodies-negative 06/03/2019-antiscleroderma antibody-less than 1 - 06/03/2019-Sjogren's syndrome 8 and B-negative 06/03/2019-hypersensitivity pneumonitis-negative 06/03/2019-sed rate 22  06/01/2019-CT chest high-res mild, bibasilar predominant pulmonary fibrosis in a pattern with featuring subtle subpleural reticular interstitial opacity and mild traction bronchiectasis occasional areas of bronchiolectasis, probable UIP  06/06/2019-swallow study-mild aspiration risk, no treatment recommended at this time  06/07/2019-barium swallow-Schatzki ring at the GE junction which prevents passage of a 13 mm barium tablet  FENO:  No results found for: NITRICOXIDE  MH: Psoriatic arthritis (Dr. Corliss Skains - prev on Humira, now on Cosentyx), latent TB that was untreated initially in 12/2012 with mild alveolitis on CT scan and pleural effusion that required thoracentesis, bronchoscopy was negative for AFB but he was treated under guidance with local health department infectious disease with multidrug antituberculosis treatment for 3 months, eventration of right  hemidiaphragm, vocal cord paralysis in September/2016 underwent ENT evaluation for persistent dysphonia and underwent gel injection of vocal cords at [facility] without any relief Smoker/ Smoking History: Never smoker Maintenance: None   He is originally from [place], [place]-immigrated to the [place] in 1971 for his bachelors to [employer] and then settled in [place] in 1978 and got into [employer] business.    OV 11/10/2020  Subjective:  Patient ID: CALLUM WOLF, male , DOB: 18-Feb-1946 , age 3 y.o. , MRN: [id number] , ADDRESS: [address] [place] [address] PCP Ignatius Specking, MD Patient Care Team: Ignatius Specking, MD as PCP - General (Internal Medicine)  This Provider for this visit: Treatment Team:  Attending Provider: Kalman Shan, MD    11/10/2020 -   Chief Complaint  Patient presents with   Follow-up    Increased coughing for past 3 months, sometimes productive with whitish sputum. Denies shortness of breath.      HPI [facility] 76 y.o. -asked by Dr. Jacinto Halim to establish at the pulmonary fibrosis center.  Therefore patient is seeing Dr. Marchelle Gearing.  Patient used to be previously followed by Dr. Vassie Loll.  Last seen by Dr. Vassie Loll in 2020.  Then seen by nurse practitioner [name] in January 2021.   His daughtre [name] later joined on the phone call.  Patient is known to have ILD probable UIP pattern.  Is on observation at this point.   [place] Integrated Comprehensive ILD Questionnaire  Symptoms:  -   he reports insidious onset of dyspnea for the last 4 months.  Since it started it is the same.  Although his ILD is present even before that.  He says that he has a very mild cough however in the last 4 to 6 months it is worse.  Particularly when he talks for a long time.  He does not wake up in the middle of the night because of the cough he does bring up some white sputum that is very light white.  Cough does not affect his voice.  He does clear his  throat.  He has never had hemoptysis.  He does not cough when he lies down.  No nausea vomiting or diarrhea.  No wheezing.  Cough does get worse with eating  Past Medical History : He has psoriatic arthritis.  He is immunosuppressed and is on immunomodulators.  Currently on Simponi.  He does not have rheumatoid arthritis.  No heart failure.  No other collagen vascular disease of connective tissue disease.  No COPD.  No asthma.  No HIV.  No hiatal hernia.  No pulmonary hypertension.  No diabetes.  No thyroid disease.  No stroke no seizures.  No tuberculosis no kidney disease no pneumonia.  No blood clots no heart disease no pleurisy.   He is deemed to be an aspiration risk he has vocal cord paralysis.  He is to see Dr. Loreta Ave GI.  He wants to switch to [facility] but because of the pandemic has not been able to establish.  ROS: He does not have any fatigue.  No arthralgia.  No  dysphagia.  No Raynaud's.  No dry eyes no fever no weight loss.  No nausea no vomiting.  No heartburn no snoring no rash no ulcers.   FAMILY HISTORY of LUNG DISEASE: No pulmonary fibrosis.  No COPD.  No asthma no sarcoid no cystic fibrosis no hypersensitive pneumonitis no autoimmune disease in the family.    EXPOSURE HISTORY: No tobacco.  No smoking cigars no smoking pipes.  No electronic cigarette use.  No vaping marijuana.  No cocaine use.  No intravenous drug use.   HOME and HOBBY DETAILS : He lives in a 77 year old family home.  He is lived there for 40 years.  No damp environment.  No mold or mildew.  No humidifier use no CPAP use no nebulizer use.  No steam iron use.  No Jacuzzi use.  No misting Fountain.  No pet birds or parakeets.  No feather pillows or blankets.  No mold in the [facility] duct.  No music habits no gardening habits.  Never exposed to birds or chickens.  No water damage in the basement.  No straw meds.  No hot tub use.  No animals at work.    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  Organic antigen exposure history is negative.  Inorganic antigen exposure history is negative.   PULMONARY TOXICITY HISTORY (27 items):  Simponi for psoriatic arthritis      .OV 12/18/2020  Subjective:  Patient ID: CANTRELL LAROUCHE, male , DOB: 12/26/1945 , age 89 y.o. , MRN: [id number] , ADDRESS: [address] [place] [address] PCP Ignatius Specking, MD Patient Care Team: Ignatius Specking, MD as PCP - General (Internal Medicine) Yates Decamp, MD as PCP - Cardiology (Cardiology)  This Provider for this visit: Treatment Team:  Attending Provider: Kalman Shan, MD    12/18/2020 -   Chief Complaint  Patient presents with   Follow-up    PFT performed today, HRCT performed 4/25, and EGD scheduled 5/23.  Pt states he has been okay since last visit. States his only concern is hoarseness with voice and an occ cough.     HPI Zebulan [facility] 76 y.o. -returns for follow-up to discuss his test results.  He continues to have hoarse voice.  He denies any dysphagia.  He is now scheduled for endoscopy 12/21/2020.  He is going to see cardiology  ahead of that.  His serologies negative.  His symptoms continue to be minimal and stable.  He had high-resolution CT scan of the chest that shows progression compared to October 2020.  It is described as probable UIP.  Back in October 2020 there was craniocaudal gradient described and no air trapping.  This time April 2022 radiologist feels there is slight upper lobe predominance and there is air trapping.  This latter description raises the question of possible chronic hypersensitive pneumonitis even though the final conclusion was probable UIP.  I went over exposure history with him again.  No organic antigen exposure history identified.  Nevertheless disease appears progressive.    CT chest HIgh res 11/23/20   IMPRESSION: CLINICAL DATA:  76 year old male with history of bronchiectasis. Follow-up study.   EXAM: CT  CHEST WITHOUT CONTRAST   TECHNIQUE: Multidetector CT imaging of the chest was performed following the standard protocol without intravenous contrast. High resolution imaging of the lungs, as well as inspiratory and expiratory imaging, was performed.   COMPARISON:  Chest CT 05/31/2019.   FINDINGS: Cardiovascular: Heart size is normal. There is no significant pericardial fluid, thickening or pericardial calcification. There is aortic atherosclerosis, as well as atherosclerosis of the great vessels of the mediastinum and the coronary arteries, including calcified atherosclerotic plaque in the left main, left anterior descending and left circumflex coronary arteries.   Mediastinum/Nodes: No pathologically enlarged mediastinal or hilar lymph nodes. Please note that accurate exclusion of hilar adenopathy is limited on noncontrast CT scans. Esophagus is unremarkable in appearance. No axillary lymphadenopathy.   Lungs/Pleura: High-resolution images demonstrate widespread patchy regions of ground-glass attenuation, septal thickening, mild cylindrical bronchiectasis and peripheral bronchiolectasis. Findings have no discernible craniocaudal gradient, and there is significant involvement in the anterior aspects of the upper lobes of the lungs bilaterally. No frank honeycombing. Inspiratory and expiratory imaging demonstrates some mild air trapping indicative of mild small airways disease. Minimal progression compared to the prior examination. No acute consolidative airspace disease. No pleural effusions. No suspicious appearing pulmonary nodules or masses are noted.   Upper Abdomen: Aortic atherosclerosis. 5 mm calcified gallstone in the neck of the gallbladder. Small calcified granuloma in the spleen incidentally noted.   Musculoskeletal: There are no aggressive appearing lytic or blastic lesions noted in the visualized portions of the skeleton 1. The appearance of the lungs is  compatible with interstitial lung disease, with a spectrum of findings once again categorized as probable usual interstitial pneumonia (UIP) per current ATS guidelines. Minimal progression of disease compared to the prior examination. 2. Aortic atherosclerosis, in addition to left main and 2 vessel coronary artery disease. Assessment for potential risk factor modification, dietary therapy or pharmacologic therapy may be warranted, if clinically indicated. 3. Cholelithiasis.   Aortic Atherosclerosis (ICD10-I70.0).     Electronically Signed   By: Trudie Reed M.D.   On: 11/24/2020 08:04   xxxx Results for URIAS, SHEEK "JAGDISH" (MRN [id number]) as of 12/18/2020 16:47  Ref. Range 06/03/2019 10:42  Aspergillus Fumigatus Latest Ref Range: NEGATIVE  NEGATIVE  Pigeon Serum Latest Ref Range: NEGATIVE  NEGATIVE  Anti Nuclear Antibody (ANA) Latest Ref Range: NEGATIVE  POSITIVE (A)  ANA Pattern 1 Unknown Nuclear, Homogeneous (A)  ANA Titer 1 Latest Units: titer 1:40 (H)  Anti JO-1 Latest Ref Range: 0.0 - 0.9 AI <0.2  CENTROMERE AB SCREEN Latest Ref Range: <1.0 NEG AI <1.0 NEG  RA Latex Turbid. Latest Ref Range: <14 IU/mL 14 (H)  SSA (Ro) (ENA) Antibody, IgG Latest  Ref Range: <1.0 NEG AI <1.0 NEG  SSB (La) (ENA) Antibody, IgG Latest Ref Range: <1.0 NEG AI <1.0 NEG  Scleroderma (Scl-70) (ENA) Antibody, IgG Latest Ref Range: <1.0 NEG AI <1.0 NEG    OV 01/21/2021  Subjective:  Patient ID: AVELARDO REESMAN, male , DOB: 24-Jul-1946 , age 76 y.o. , MRN: [id number] , ADDRESS: [place] [address] PCP Glenda Chroman, MD Patient Care Team: Glenda Chroman, MD as PCP - General (Internal Medicine) Adrian Prows, MD as PCP - Cardiology (Cardiology)  This Provider for this visit: Treatment Team:  Attending Provider: Brand Males, MD    01/21/2021 -   Chief Complaint  Patient presents with   Follow-up    Pt states he has been doing okay since last visit.  States that he has not started on the OFEV medication yet.     HPI [facility] [facility] 76 y.o. -presents for follow-up in this visit he was supposed to have started nintedanib by now.  However he is yet to start this.  There are  supply chain issues.  But he says it is approved.  Overall no change in symptoms.  Unclear if he is finished his GI work-up.  He continues to have some hoarse voice.  He has not seen ENT.  He is accepted my recommendation to have a referral.  He did asked me whether a few episode of chlorine exposure acutely 40 years ago could have caused his pulmonary fibrosis.  I told him while it was possible it is unlikely.  Unclear about single episode although possible as be seen with COVID   02/18/2021 Patient contacted today for 4 week follow-up. He started OFEV anti-fibrotic on June 30th. He developed headache and diarrhea after 2 days of starting medication. He stopped taking OFEV 4 days ago d/t GI symptoms and fatigue. He also noted that his blood pressure was elevated. His symptoms have resolved since stopping medication.    MDD confderence Sept 2022  Dr Polly Cobia - November 23, 2020 - HRCT - compared to CTA 2017 and HRCT 2020. Recent CT  moderage patchy retic, and some GGO. TB is mild . Findings are distriubted evenly - no CCG.  No frank HC. clearly tehre is fibrotic ILD. Air trapping is ver minimal and in lower lobes and is physiologic and not clinicall signifcant. No AT in upper lobe or middle lobe/lingulare   Final DX: Prob UIP and ther is mild progression since 2020 and defintiey since 2017.   Clinical dx:  IPF and Rx as such   OV 04/20/2021  Subjective:  Patient ID: YUEPHENG SCHALLER, male , DOB: Nov 11, 1945 , age 89 y.o. , MRN: [id number] , ADDRESS: [place] [address] PCP Glenda Chroman, MD Patient Care Team: Glenda Chroman, MD as PCP - General (Internal Medicine) Adrian Prows, MD as PCP - Cardiology (Cardiology)  This Provider for this visit:  Treatment Team:  Attending Provider: Brand Males, MD    04/20/2021 -   Chief Complaint  Patient presents with   Follow-up    Pt states his BP is still increasing even after going to 1 pill of the OFEV a day.   IPF: age greater than 65, Velcro crackles at the base, previous description of probable UIP and nearly negative serology [psoriatic arthritis does not count is connective tissue disease ILD], vocal cord paralysis with some aspiration risk that this is IPF.   - Dx given June 2022 - Confirmd MDD conferne Sept 2022   -  Ofev June 2022  HPI [place] [facility] 76 y.o. -presents for follow-up.  I am meeting his wife for the first time.  He feels he is stable.  He is walking 30 minutes in the mornings and he can do this without stopping.  He says his dyspnea is not worse and his cough is not worse but objectively on the subjective symptom questionnaire there might be progression.  We did discuss his case in the ILD conference.  The diagnosis is IPF.  There is progression.  In terms of therapeutics we started him on nintedanib at 150 mg twice daily but he called saying that he was intolerant to it because of hypertension [anecdotally he is my first patient with hypertension in 6 years with nintedanib] but also diarrhea and fatigue.  He then stopped it and apparently his blood pressure normalized.  Then 04/10/2021 he started himself on nintedanib 100 mg strength.  He is currently doing only 1 time daily.  He feels his blood pressure is going up again to [id number] systolic.  [At 150 twice a day it was at [id number] systolic] and this is no normal.  He is not having any other symptoms.  We discussed the option of switching to pirfenidone and going through approval process again.  We discussed the side effects of skin issues, fatigue, nausea, anorexia and ongoing liver monitoring with significant overlap with nintedanib side effects.  At this point in time he is resolved to increase his nintedanib to 100 mg  twice daily and if he is tolerating well and that go to 150 mg twice daily.  If this fails then he will go to pirfenidone.  End of November 2022 he is going to go to [place] till February 2022.  He will have a high-dose flu shot today.         OV 06/07/2021  Subjective:  Patient ID: JANTZ SHADDY, male , DOB: Nov 24, 1945 , age 63 y.o. , MRN: [id number] , ADDRESS: [place] [id number] PCP Glenda Chroman, MD Patient Care Team: Glenda Chroman, MD as PCP - General (Internal Medicine) Adrian Prows, MD as PCP - Cardiology (Cardiology)  This Provider for this visit: Treatment Team:  Attending Provider: Brand Males, MD    06/07/2021 -   Chief Complaint  Patient presents with   Follow-up    4wk f/u after starting Ofev. Stopped the Ofev about 3 weeks ago due to increased blood pressure. Plans to restart it next Monday. Denies any increased nausea, diarrhea or loss of appetite.    IPF: age greater than 65, Velcro crackles at the base, previous description of probable UIP and nearly negative serology [psoriatic arthritis does not count is connective tissue disease ILD], vocal cord paralysis with some aspiration risk that this is IPF.   - Dx given June 2022 - Confirmd MDD conferne Sept 2022   - Ofev June 2022 - stopped Oct/Nov 2022 due to hypertension  HPI [facility] 76 y.o. -returns for his IPF follow-up.  This particular visit is to focus on how he is tolerating his nintedanib.  He is extremely concerned his nintedanib is causing hypertension.  When he was taking 100 mg which is the lower dose at 1 pill/day he did not have hypertension.  He then increase it to 100 mg twice daily [this is the lowest therapeutic dose].  He says this caused his blood pressure systolic to go up to 0000000.  He has since stopped taking all nintedanib.  His blood pressure  is now normal.  He does not want take nintedanib anymore.  We discussed the alternative of taking  pirfenidone.  He is open to this idea.  Pirfenidone is not known to cause hypertension.  In fact even at nintedanib I am not seen any patient developed hypertension except him.  We discussed the other side effects of pirfenidone including GI issues, weight loss, nausea, liver function test monitoring and necessity for sunscreen.  He is willing to do all this.  He has upcoming travel to [place] towards the end of November 2022 through February 2023.  We explained that not taking the antifibrotic but somewhat less than 5% chance of progression over 3 months.  He does not want to take this risk because of prior progression.  He just wants to take the medication pirfenidone.  So we will make a request for this.  I did advise him to register himself with a pulmonologist and his home city in [place].  I also spoke to his daughter over the phone.  They will arrange this.  He is willing to do video and telephone visit with me when he is in [place].  Of note he developed back pain with right-sided sciatica 3 weeks ago.  He has right-sided sciatica.  He had an MRI that shows DJD findings.  He will follow-up with Dr. Durward Fortes orthopedics for this.  He and his wife and daughter Derek Jack on the phone] feel that he may not make it [place] because of the back pain.  I advised her to talk about this with Dr. Durward Fortes.  Of note he has upcoming pulmonary function test appointment in 1 week.  He also has office visit.  We discussed this with him.  He will reschedule his office visit as a televisit to discuss his pirfenidone uptake and his pulmonary function test combined.  He will do this from [place].      OV 06/15/2021  Subjective:  Patient ID: JAGUAR SAFKO, male , DOB: November 27, 1945 , age 43 y.o. , MRN: [id number] , ADDRESS: [address] [place] [address] PCP Glenda Chroman, MD Patient Care Team: Glenda Chroman, MD as PCP - General (Internal Medicine) Adrian Prows, MD as PCP - Cardiology (Cardiology)  This  Provider for this visit: Treatment Team:  Attending Provider: Brand Males, MD    06/15/2021 -   Chief Complaint  Patient presents with   Follow-up    Pft done today. No new concerns     HPI [facility] 76 y.o. -returns for follow-up.  His wife is here with him.  His symptom score shows minimal symptoms and is actually better.  His pulmonary function test shows slight worsening compared to 2 years ago.  But he is not feeling this.  He is off his nintedanib and is feeling better.  He is decided to start pirfenidone.  I did indicate to him that his progression.  He is here to start.  Pirfenidone because although approved the shipment is pending.  I sent a message to the pharmacist to see when his shipment would arrive.  His weight is stable.  He is doing some physical therapy for his back.  He is going to see Dr. Durward Fortes tomorrow.  He will register with a  pulmonologist in [place].  While in [place] once his [place] pirfenidone runs he will get local pirfenidone.  He will get this through his pulmonologist there.       OV 05/17/2022  Subjective:  Patient ID: Mitchell Heir,  male , DOB: Nov 27, 1945 , age 17 y.o. , MRN: [id number] , ADDRESS: [address] [address] PCP Ignatius Specking, MD Patient Care Team: Ignatius Specking, MD as PCP - General (Internal Medicine) Yates Decamp, MD as PCP - Cardiology (Cardiology)  This Provider for this visit: Treatment Team:  Attending Provider: Kalman Shan, MD IPF: age greater than 65, Velcro crackles at the base, previous description of probable UIP and nearly negative serology [psoriatic arthritis does not count is connective tissue disease ILD], vocal cord paralysis with some aspiration risk that this is IPF.   - Dx given June 2022 - Confirmd MDD conferne Sept 2022   - Ofev June 2022 - stopped Oct/Nov 2022 due to hypertension  -Pirfenidone start November 2022   05/17/2022 -   Chief Complaint  Patient  presents with   Follow-up    Doing well today. No sx noted     HPI [facility] 76 y.o. -returns for follow-up.  Last seen almost a year ago.  After that he went to [place] and came back in February 2023.  Then after coming back from [place] he was in [place] with his son for several months and then spending a lot of time in [place] with his grandson.  Because of this he did not make follow-up visits with [place].  While in [place] he was seen by a physician who he knows personally.  During this entire time it appears there was no liver function test monitoring.  I asked him why he missed his appointments with [place].  He states that after he came back from [place] he has been spending time in different places.  I did indicate to him that close follow-up is needed.  In any event symptoms are stable he is tolerating pirfenidone really well.  There is only a 2 pound difference since the last year.  He is going to go back to [place] again later this month and return in February 2024.  His close friend who is also my patient with IPF recently had COVID-related IPF flareup.  He did mention this to me.  Of note he is status flu shot and also COVID booster according to his history.  He plans to have RSV vaccine before his travel to [place].    SYMPTOM SCALE - ILD 11/10/2020 and 12/18/2020   01/21/2021  04/20/2021 ofev 06/15/2021 158# - off ofev. Going to start esbriet 05/17/2022 156#ra  O2 use ra ra ra ra ra  Shortness of Breath 0 -> 5 scale with 5 being worst (score 6 If unable to do)      At rest 0 0 0 0 0  Simple tasks - showers, clothes change, eating, shaving 0 0 0 0 0  Household (dishes, doing bed, laundry) 0 1 2 0 0  Shopping 0 0 0 0 0  Walking level at own pace 1 0 1 0 0  Walking up Stairs 1 1 1 1 1   Total (30-36) Dyspnea Score 2 2 4 1 1   How bad is your cough? 1 1 2.5 0 1  How bad is your fatigue x 0 0 0 0  How bad is nausea 0 0 0 0 o  How bad is vomiting?  0 00 0 0 0  How bad is diarrhea? 0  0  0 0  How bad is anxiety? x 0 0 0 0  How bad is depression x 0 0 0 0  0      Simple  office walk 185 feet x  3 laps goal with forehead probe 11/10/2020  06/07/2021  05/17/2022 With  O2 used ra ra Walk test ra  Number laps completed 3 3 3   Comments about pace avg  Mo dpace  Resting Pulse Ox/HR 100% and 66/min 99% and HR 79 100% and= HR 56  Final Pulse Ox/HR 100% and 103/min 97% and HR 102 100% and HR 105  Desaturated </= 88% no no no  Desaturated <= 3% points no no no  Got Tachycardic >/= 90/min yes yes yes  Symptoms at end of test none Mild dyspnea No complaints  Miscellaneous comments x stabe stable    CT Chest data  No results found.    PFT     Latest Ref Rng & Units 06/15/2021   12:51 PM 12/18/2020    2:23 PM 07/19/2019    1:38 PM  PFT Results  FVC-Pre L 2.07  1.98  2.32   FVC-Predicted Pre % 62  59  63   FVC-Post L  2.01  2.06   FVC-Predicted Post %  60  56   Pre FEV1/FVC % % 83  89  83   Post FEV1/FCV % %  92  93   FEV1-Pre L 1.72  1.76  1.94   FEV1-Predicted Pre % 67  69  69   FEV1-Post L  1.84  1.93   DLCO uncorrected ml/min/mmHg 12.64  14.24  13.59   DLCO UNC% % 44  50  43   DLCO corrected ml/min/mmHg 12.64  14.24    DLCO COR %Predicted % 44  50    DLVA Predicted % 75  104  90   TLC L  3.60  3.80   TLC % Predicted %  55  55   RV % Predicted %  34  60        has a past medical history of Acute bronchiolitis due to other infectious organisms (01/26/2013), Arthritis, Extrapulmonary TB (tuberculosis) (02/02/2013), GERD (gastroesophageal reflux disease), High risk medication use (12/27/2016), History of hiatal hernia (2007; 2009), Hyperlipidemia, Hypertension, and Pleural effusion (01/26/2013).   reports that he has never smoked. He has never used smokeless tobacco.  Past Surgical History:  Procedure Laterality Date   BIOPSY  04/01/2021   Procedure: BIOPSY;  Surgeon: 06/01/2021, MD;  Location: [facility];  Service: Endoscopy;;   CATARACT  EXTRACTION W/ INTRAOCULAR LENS  IMPLANT, BILATERAL Bilateral    ESOPHAGOGASTRODUODENOSCOPY (EGD) WITH PROPOFOL N/A 04/01/2021   Procedure: ESOPHAGOGASTRODUODENOSCOPY (EGD) WITH PROPOFOL;  Surgeon: 06/01/2021, MD;  Location: [facility];  Service: Endoscopy;  Laterality: N/A;   HIATAL HERNIA REPAIR  2007; 2009   SAVORY DILATION N/A 04/01/2021   Procedure: SAVORY DILATION;  Surgeon: 06/01/2021, MD;  Location: [facility];  Service: Endoscopy;  Laterality: N/A;   TOTAL KNEE ARTHROPLASTY Left 11/03/2015   TOTAL KNEE ARTHROPLASTY Left 11/03/2015   Procedure: Left Total Knee Arthroplasty;  Surgeon: 01/03/2016, MD;  Location: [facility] OR;  Service: Orthopedics;  Laterality: Left;   TOTAL KNEE ARTHROPLASTY Right 07/17/2018   Procedure: RIGHT TOTAL KNEE ARTHROPLASTY;  Surgeon: 07/19/2018, MD;  Location: [facility] OR;  Service: Orthopedics;  Laterality: Right;   VIDEO BRONCHOSCOPY Bilateral 01/31/2013   Procedure: VIDEO BRONCHOSCOPY WITHOUT FLUORO;  Surgeon: 04/03/2013, MD;  Location: [facility] ENDOSCOPY;  Service: Cardiopulmonary;  Laterality: Bilateral;    Allergies  Allergen Reactions   Ofev [Nintedanib] Hypertension    Immunization History  Administered Date(s)  Administered   Fluad Quad(high Dose 65+) 04/20/2021   Influenza, High Dose Seasonal PF 04/22/2019   Influenza-Unspecified 05/10/2022   PPD Test 04/06/2010   Tdap 05/30/2017    Family History  Problem Relation Age of Onset   Colon cancer Neg Hx    Stomach cancer Neg Hx    Esophageal cancer Neg Hx    Colon polyps Neg Hx    Rectal cancer Neg Hx      Current Outpatient Medications:    aspirin (ASPIRIN CHILDRENS) 81 MG chewable tablet, Chew 1 tablet (81 mg total) by mouth daily., Disp: , Rfl:    losartan (COZAAR) 50 MG tablet, Take 1 tablet (50 mg total) by mouth daily., Disp: 90 tablet, Rfl: 1   methotrexate 2.5 MG tablet, Take 3 tablets by mouth once a week., Disp: , Rfl:    multivitamin-iron-minerals-folic acid  (CENTRUM) chewable tablet, Chew 1 tablet by mouth daily., Disp: , Rfl:    nirmatrelvir/ritonavir EUA (PAXLOVID) 20 x 150 MG & 10 x  TABS, Take 3 tablets by mouth 2 (two) times daily for 5 days. Patient GFR is >60. Take nirmatrelvir (150 mg) two tablets twice daily for 5 days and ritonavir (100 mg) one tablet twice daily for 5 days., Disp: 30 tablet, Rfl: 0   omeprazole (PRILOSEC) 20 MG capsule, TAKE 1 CAPSULE(20 MG) BY MOUTH DAILY, Disp: 90 capsule, Rfl: 1   Pirfenidone (ESBRIET) 267 MG TABS, Take 3 tablets (801 mg total) by mouth 3 (three) times daily., Disp: 180 tablet, Rfl: 4   rosuvastatin (CRESTOR) 5 MG tablet, TAKE 1 TABLET(5 MG) BY MOUTH DAILY, Disp: 90 tablet, Rfl: 3   traMADol (ULTRAM) 50 MG tablet, Take 1 tablet (50 mg total) by mouth every 6 (six) hours as needed., Disp: 30 tablet, Rfl: 0   Triamcinolone Acetonide (TRIAMCINOLONE 0.1 % CREAM : EUCERIN) CREA, Apply 1 application topically 2 (two) times daily., Disp: 1 each, Rfl: 0   STELARA 45 MG/0.5ML SOSY injection, Inject 45 mg into the skin every 3 (three) months. (Patient not taking: Reported on 05/17/2022), Disp: , Rfl:       Objective:   Vitals:   05/17/22 0832  BP: 112/64  Pulse: 61  Temp: 97.8 F (36.6 C)  TempSrc: Oral  SpO2: 96%  Weight: 156 lb 6.4 oz (70.9 kg)  Height:  (1.702 m)    Estimated body mass index is 24.5 kg/m as calculated from the following:   Height as of this encounter:  (1.702 m).   Weight as of this encounter: 156 lb 6.4 oz (70.9 kg).  @  [employer]   05/17/22 0832  Weight: 156 lb 6.4 oz (70.9 kg)     Physical Exam    General: No distress. Looks well Neuro: Alert and Oriented x 3. GCS 15. Speech normal Psych: Pleasant Resp:  Barrel Chest - no.  Wheeze - no, Crackles - very mild baseal cracke, No overt respiratory distress CVS: Normal heart sounds. Murmurs - no Ext: Stigmata of Connective Tissue Disease - no HEENT: Normal upper airway. PEERL +. No post  nasal drip        Assessment:       ICD-10-CM   1. IPF (idiopathic pulmonary fibrosis) (HCC)  J84.112     2. Encounter for medication counseling  Z71.89     3. Travel advice encounter  Z71.84     4. Vaccine counseling  Z71.85          Plan:  Patient Instructions  IPF (idiopathic pulmonary fibrosis) (HCC)  -slightly progressive in 2 years but clinically stable last 1 year on esbriet  - glad tolerating esbriet well  -  Plan - - make PFT  for spiro and dlco appt march 2024 = check cbc bmet, LFT 05/17/2022 - continue esbriet  for IPF - will need monitoring in [place] with lung doc there - do not miss followups with [place]    Travel aDvice - [place] Nov 2022- feb 2024  Plan  -- once in [place] you need pulmonary doctor to monitor your esbret - take RSV vaccine on your own before travel - take paxlovid handy for covid in case you get covid in [place]   Follow-up -  - next office visit In feb 2024/mrch 2024 but after PFT testing  - 30 min with Dr Marchelle Gearingamaswamy  = symptoms score and walk test at followup  High complex medical condition requiring high risk prescription with intensive therapeutic monitoring requirement   SIGNATURE    Dr. Kalman ShanMurali Reanne Nellums, M.D., F.C.C.P,  Pulmonary and Critical Care Medicine Staff Physician, [facility] Director - Interstitial Lung Disease  Program  Pulmonary Fibrosis [facility] [place], [place], [id number]  Pager: [phone], If no answer or between  15:00h - 7:00h: call [phone] Telephone: [phone]  8:53 AM 05/17/2022

## 2022-05-17 NOTE — Patient Instructions (Addendum)
IPF (idiopathic pulmonary fibrosis) ([place])  -slightly progressive in 2 years but clinically stable last 1 year on esbriet  - glad tolerating esbriet well  -  Plan - - make PFT  for spiro and dlco appt march 2024 = check cbc bmet, LFT 05/17/2022 - continue esbriet  for IPF - will need monitoring in [place] with lung doc there - do not miss followups with [place]    Travel aDvice - [place] Nov 2022- feb 2024  Plan  -- once in [place] you need pulmonary doctor to monitor your esbret - take RSV vaccine on your own before travel - take paxlovid handy for covid in case you get covid in [place]   Follow-up -  - next office visit In feb 2024/mrch 2024 but after PFT testing  - 30 min with Dr Chase Caller  = symptoms score and walk test at followup

## 2022-05-18 ENCOUNTER — Telehealth: Payer: Self-pay | Admitting: Internal Medicine

## 2022-05-18 NOTE — Telephone Encounter (Signed)
Attempted to call pt but line went directly to VM. Left message for him to return call. 

## 2022-05-18 NOTE — Telephone Encounter (Signed)
  Alan Roman  LFT yesterday shows slight bump in LFT  Plan  -repeat LFT 1 week but before [place] trip end of month     Current Outpatient Medications:    aspirin (ASPIRIN CHILDRENS) 81 MG chewable tablet, Chew 1 tablet (81 mg total) by mouth daily., Disp: , Rfl:    losartan (COZAAR) 50 MG tablet, Take 1 tablet (50 mg total) by mouth daily., Disp: 90 tablet, Rfl: 1   methotrexate 2.5 MG tablet, Take 3 tablets by mouth once a week., Disp: , Rfl:    multivitamin-iron-minerals-folic acid (CENTRUM) chewable tablet, Chew 1 tablet by mouth daily., Disp: , Rfl:    nirmatrelvir/ritonavir EUA (PAXLOVID) 20 x 150 MG & 10 x 100MG  TABS, Take 3 tablets by mouth 2 (two) times daily for 5 days. Patient GFR is >60. Take nirmatrelvir (150 mg) two tablets twice daily for 5 days and ritonavir (100 mg) one tablet twice daily for 5 days., Disp: 30 tablet, Rfl: 0   omeprazole (PRILOSEC) 20 MG capsule, TAKE 1 CAPSULE(20 MG) BY MOUTH DAILY, Disp: 90 capsule, Rfl: 1   Pirfenidone (ESBRIET) 267 MG TABS, Take 3 tablets (801 mg total) by mouth 3 (three) times daily., Disp: 180 tablet, Rfl: 4   rosuvastatin (CRESTOR) 5 MG tablet, TAKE 1 TABLET(5 MG) BY MOUTH DAILY, Disp: 90 tablet, Rfl: 3   STELARA 45 MG/0.5ML SOSY injection, Inject 45 mg into the skin every 3 (three) months. (Patient not taking: Reported on 05/17/2022), Disp: , Rfl:    traMADol (ULTRAM) 50 MG tablet, Take 1 tablet (50 mg total) by mouth every 6 (six) hours as needed., Disp: 30 tablet, Rfl: 0   Triamcinolone Acetonide (TRIAMCINOLONE 0.1 % CREAM : EUCERIN) CREA, Apply 1 application topically 2 (two) times daily., Disp: 1 each, Rfl: 0   LABS    PULMONARY No results for input(s): "PHART", "PCO2ART", "PO2ART", "HCO3", "TCO2", "O2SAT" in the last 168 hours.  Invalid input(s): "PCO2", "PO2"  CBC Recent Labs  Lab 05/17/22 0902  HGB 13.6  HCT 39.9  WBC 5.5  PLT 156.0    COAGULATION No results for input(s): "INR" in the last 168 hours.  CARDIAC  No  results for input(s): "TROPONINI" in the last 168 hours. No results for input(s): "PROBNP" in the last 168 hours.   CHEMISTRY Recent Labs  Lab 05/17/22 0902  NA 136  K 4.3  CL 102  CO2 29  GLUCOSE 95  BUN 15  CREATININE 1.24  CALCIUM 9.2   Estimated Creatinine Clearance: 46.6 mL/min (by C-G formula based on SCr of 1.24 mg/dL).   LIVER Recent Labs  Lab 05/17/22 0902  AST 44*  ALT 27  ALKPHOS 89  BILITOT 0.6  PROT 7.6  ALBUMIN 4.0     INFECTIOUS No results for input(s): "LATICACIDVEN", "PROCALCITON" in the last 168 hours.   ENDOCRINE CBG (last 3)  No results for input(s): "GLUCAP" in the last 72 hours.       IMAGING x48h  - image(s) personally visualized  -   highlighted in bold No results found.

## 2022-05-19 ENCOUNTER — Encounter: Payer: Self-pay | Admitting: Cardiology

## 2022-05-19 ENCOUNTER — Ambulatory Visit: Payer: Medicare Other | Admitting: Cardiology

## 2022-05-19 ENCOUNTER — Telehealth: Payer: Self-pay | Admitting: Pharmacist

## 2022-05-19 VITALS — BP 116/75 | HR 67 | Temp 97.8°F | Resp 16 | Ht 67.0 in | Wt 157.0 lb

## 2022-05-19 DIAGNOSIS — I10 Essential (primary) hypertension: Secondary | ICD-10-CM

## 2022-05-19 DIAGNOSIS — J84112 Idiopathic pulmonary fibrosis: Secondary | ICD-10-CM

## 2022-05-19 DIAGNOSIS — I2729 Other secondary pulmonary hypertension: Secondary | ICD-10-CM

## 2022-05-19 DIAGNOSIS — J849 Interstitial pulmonary disease, unspecified: Secondary | ICD-10-CM

## 2022-05-19 MED ORDER — PIRFENIDONE 801 MG PO TABS: 801.0000 mg | ORAL_TABLET | Freq: Three times a day (TID) | ORAL | 0 refills | Status: DC

## 2022-05-19 NOTE — Telephone Encounter (Signed)
Patient states he is going out of the country from November 2023 through February 2024 and requesting enough supply to bridge him through trip.  ATC patient to review but unable to reach. Phone went straight to VM. Left VM requesting reutrn call but otherwise advised him to reach out to [facility] ASAP to ensure he has medication prior to leaving country. MyChart message sent as well to ensure all forms of communication utilized  Knox Saliva, PharmD, MPH, BCPS, CPP Clinical Pharmacist (Rheumatology and Pulmonology)

## 2022-05-19 NOTE — Progress Notes (Signed)
Primary Physician/Referring:  Glenda Chroman, MD  Patient ID: Alan Roman, male    DOB: Dec 28, 1945, 76 y.o.   MRN: [id number]  Chief Complaint  Patient presents with   Hypertension   Hyperlipidemia   Coronary calcification   Follow-up   HPI:    Alan Roman  is a 76 y.o. Asian [place] male with history of hypertension, psoriatic arthritis and follows Dr. Bo Merino, very mild hyperlipidemia and coronary calcificaiton by CT chest, IPF, hypertension.   Due to recent high-resolution CT scan done on 11/23/2020 revealing left main and two-vessel coronary calcification and aortic atherosclerosis, in view of underlying dyspnea, he underwent echocardiogram and stress test.  Echocardiogram revealed mild to moderate TR, normal LVEF and otherwise no significant abnormalities.  Stress test was overall low risk.    He is now planning on a trip to go to [place], was seen by [place] about 3 months ago but wanted to be seen again.  No change in his physical exam, his dyspnea has remained stable, there is no evidence of right-sided heart failure by physical examination.  Dyspnea on exertion remains stable.  Denies palpitations, syncope, near syncope, dizziness, orthopnea, leg swelling.  Past Medical History:  Diagnosis Date   Acute bronchiolitis due to other infectious organisms 01/26/2013   Arthritis    "knees" (11/03/2015)   Extrapulmonary TB (tuberculosis) 02/02/2013   antibx therapy in 2014   GERD (gastroesophageal reflux disease)    on meds   High risk medication use 12/27/2016   History of hiatal hernia 2007; 2009   Hyperlipidemia    on meds   Hypertension    on meds   Pleural effusion 01/26/2013   Family History  Problem Relation Age of Onset   Colon cancer Neg Hx    Stomach cancer Neg Hx    Esophageal cancer Neg Hx    Colon polyps Neg Hx    Rectal cancer Neg Hx    Past Surgical History:  Procedure Laterality Date   BIOPSY  04/01/2021   Procedure: BIOPSY;  Surgeon:  Ladene Artist, MD;  Location: [facility];  Service: Endoscopy;;   CATARACT EXTRACTION W/ INTRAOCULAR LENS  IMPLANT, BILATERAL Bilateral    ESOPHAGOGASTRODUODENOSCOPY (EGD) WITH PROPOFOL N/A 04/01/2021   Procedure: ESOPHAGOGASTRODUODENOSCOPY (EGD) WITH PROPOFOL;  Surgeon: Ladene Artist, MD;  Location: [facility];  Service: Endoscopy;  Laterality: N/A;   HIATAL HERNIA REPAIR  2007; 2009   SAVORY DILATION N/A 04/01/2021   Procedure: SAVORY DILATION;  Surgeon: Ladene Artist, MD;  Location: [facility];  Service: Endoscopy;  Laterality: N/A;   TOTAL KNEE ARTHROPLASTY Left 11/03/2015   TOTAL KNEE ARTHROPLASTY Left 11/03/2015   Procedure: Left Total Knee Arthroplasty;  Surgeon: Garald Balding, MD;  [facility];  Service: Orthopedics;  Laterality: Left;   TOTAL KNEE ARTHROPLASTY Right 07/17/2018   Procedure: RIGHT TOTAL KNEE ARTHROPLASTY;  Surgeon: Garald Balding, MD;  Location: [facility];  Service: Orthopedics;  Laterality: Right;   VIDEO BRONCHOSCOPY Bilateral 01/31/2013   Procedure: VIDEO BRONCHOSCOPY WITHOUT FLUORO;  Surgeon: Wilhelmina Mcardle, MD;  Location: [facility];  Service: Cardiopulmonary;  Laterality: Bilateral;   Social History   Tobacco Use   Smoking status: Never   Smokeless tobacco: Never  Substance Use Topics   Alcohol use: No    ROS  Review of Systems  Cardiovascular:  Positive for dyspnea on exertion (stable). Negative for chest pain and leg swelling.    Objective  Blood pressure 116/75, pulse 67, temperature  97.8 F (36.6 C), temperature source Temporal, resp. rate 16, height 5\' 7"  (1.702 m), weight 157 lb (71.2 kg), SpO2 97 %.     05/19/2022    8:40 AM 05/17/2022    8:32 AM 12/21/2021    8:58 AM  Vitals with BMI  Height 5\' 7"  5\' 7"  5\' 7"   Weight 157 lbs 156 lbs 6 oz 157 lbs 13 oz  BMI 24.58 [id number]  Systolic [id number]  Diastolic 75 64 74  Pulse 67 61 69    Physical Exam Neck:     Vascular: No carotid bruit or JVD.  Cardiovascular:      Rate and Rhythm: Normal rate and regular rhythm.     Pulses: Intact distal pulses.     Heart sounds: Normal heart sounds. No murmur heard.    No gallop.  Pulmonary:     Effort: Pulmonary effort is normal.     Breath sounds: Rales (Leathery crackles at bilateral bases, left greater than the right.) present.  Abdominal:     General: Bowel sounds are normal.     Palpations: Abdomen is soft.  Musculoskeletal:     Right lower leg: No edema.     Left lower leg: No edema.    Laboratory examination:   Recent Labs    05/17/22 0902  NA 136  K 4.3  CL 102  CO2 29  GLUCOSE 95  BUN 15  CREATININE 1.24  CALCIUM 9.2   estimated creatinine clearance is 46.6 mL/min (by C-G formula based on SCr of 1.24 mg/dL).     Latest Ref Rng & Units 05/17/2022    9:02 AM 04/20/2021    3:03 PM 12/29/2020    2:02 PM  CMP  Glucose 70 - 99 mg/dL 95   127   BUN 6 - 23 mg/dL 15   17   Creatinine 0.40 - 1.50 mg/dL 1.24   1.09   Sodium 135 - 145 mEq/L 136   139   Potassium 3.5 - 5.1 mEq/L 4.3   4.1   Chloride 96 - 112 mEq/L 102   105   CO2 19 - 32 mEq/L 29   27   Calcium 8.4 - 10.5 mg/dL 9.2   8.6   Total Protein 6.0 - 8.3 g/dL 7.6  7.8  7.3   Total Bilirubin 0.2 - 1.2 mg/dL 0.6  0.6  0.5   Alkaline Phos 39 - 117 U/L 89  76  77   AST 0 - 37 U/L 44  27  27   ALT 0 - 53 U/L 27  17  18        Latest Ref Rng & Units 05/17/2022    9:02 AM 12/29/2020    2:02 PM 05/21/2020   10:28 AM  CBC  WBC 4.0 - 10.5 K/uL 5.5  5.6  5.1   Hemoglobin 13.0 - 17.0 g/dL 13.6  13.6  13.1   Hematocrit 39.0 - 52.0 % 39.9  38.9  39.6   Platelets 150.0 - 400.0 K/uL 156.0  156.0  161     External labs:  Cholesterol, total 148.000 m 06/18/2021 HDL 54.000 mg 06/18/2021 LDL 82.000 mg 06/18/2021 Triglycerides 57.000 mg 06/18/2021  TSH 1.140 06/18/2021  Allergies   Allergies  Allergen Reactions   Ofev [Nintedanib] Hypertension     Final Medications at End of Visit     Current Outpatient Medications:    aspirin  (ASPIRIN CHILDRENS) 81 MG chewable tablet, Chew 1 tablet (81 mg total) by  mouth daily., Disp: , Rfl:    losartan (COZAAR) 50 MG tablet, Take 1 tablet (50 mg total) by mouth daily., Disp: 90 tablet, Rfl: 1   methotrexate 2.5 MG tablet, Take 3 tablets by mouth once a week., Disp: , Rfl:    multivitamin-iron-minerals-folic acid (CENTRUM) chewable tablet, Chew 1 tablet by mouth daily., Disp: , Rfl:    omeprazole (PRILOSEC) 20 MG capsule, TAKE 1 CAPSULE(20 MG) BY MOUTH DAILY, Disp: 90 capsule, Rfl: 1   Pirfenidone (ESBRIET) 267 MG TABS, Take 3 tablets (801 mg total) by mouth 3 (three) times daily., Disp: 180 tablet, Rfl: 4   rosuvastatin (CRESTOR) 5 MG tablet, TAKE 1 TABLET(5 MG) BY MOUTH DAILY, Disp: 90 tablet, Rfl: 3   Triamcinolone Acetonide (TRIAMCINOLONE 0.1 % CREAM : EUCERIN) CREA, Apply 1 application topically 2 (two) times daily., Disp: 1 each, Rfl: 0   Radiology:   Barium swallow study 06/07/2019: Schatzki ring at the GE junction, which prevents passage of a 13 mm barium tablet. No evidence of hiatal hernia or gastroesophageal reflux.  High-resolution CT scan of the chest 11/23/2020:  1. The appearance of the lungs is compatible with interstitial lung disease, with a spectrum of findings once again categorized as probable usual interstitial pneumonia (UIP) per current ATS guidelines. Minimal progression of disease compared to the prior examination. 2. Aortic atherosclerosis, in addition to left main and 2 vessel coronary artery disease. Assessment for potential risk factor modification, dietary therapy or pharmacologic therapy may be warranted, if clinically indicated. 3. Cholelithiasis.  Cardiac Studies:   Lexiscan Tetrofosmin Stress Test  12/21/2020: Nondiagnostic ECG stress. Myocardial perfusion is normal. Overall LV systolic function is normal without regional wall motion abnormalities. Stress LVEF: 51%. No previous exam available for comparison.  Echocardiogram  12/22/2020: Left ventricle cavity is normal in size. Mild concentric hypertrophy of the left ventricle. Normal global wall motion. Normal LV systolic function with EF 63%. Doppler evidence of grade I (impaired) diastolic dysfunction, normal LAP.  Left atrial cavity is normal in size. Aneurysmal interatrial septum without 2D or color Doppler evidence of shunting. Trileaflet aortic valve.  Mild (Grade I) aortic regurgitation. Mild aortic valve leaflet calcification. Mild (Grade I) mitral regurgitation. Mild to moderate tricuspid regurgitation. Estimated pulmonary artery systolic pressure 30 mmHg.  Mild pulmonic regurgitation.   EKG  12/21/2021: Sinus rhythm with a rate of 64 bpm with a single PVC.  Left axis.  Left atrial enlargement.  Low voltage complexes.  Nonspecific T wave abnormality.  Compared EKG 04/27/2021, no significant change.  EKG 09/25/2020: Normal sinus rhythm at rate of 69 bpm, left atrial enlargement, normal axis.  Low-voltage complexes.  No evidence of ischemia.  Probable pulmonary disease pattern.   EKG 09/06/2019: Normal sinus rhythm at rate of 62 bpm, left axis deviation, left intrafascicular block. IRBBB. No evidence of ischemia. No significant change from  Prior EKG.   Assessment     ICD-10-CM   1. Essential hypertension  I10     2. ILD (interstitial lung disease) (HCC)  J84.9 PCV ECHOCARDIOGRAM COMPLETE    3. Other secondary pulmonary hypertension (HCC)  I27.29 PCV ECHOCARDIOGRAM COMPLETE      No orders of the defined types were placed in this encounter.   Medications Discontinued During This Encounter  Medication Reason   nirmatrelvir/ritonavir EUA (PAXLOVID) 20 x 150 MG & 10 x 100MG  TABS    STELARA 45 MG/0.5ML SOSY injection    traMADol (ULTRAM) 50 MG tablet    Orders Placed This Encounter  Procedures  PCV ECHOCARDIOGRAM COMPLETE    Standing Status:   Future    Standing Expiration Date:   05/20/2023     Recommendations:   Alan Roman  is a 76  y.o. [place] male with history of hypertension, psoriatic arthritis and follows Dr. Bo Merino, very mild hyperlipidemia and coronary calcificaiton by CT chest, IPF, hypertension.   Due to recent high-resolution CT scan done on 11/23/2020 revealing left main and two-vessel coronary calcification and aortic atherosclerosis, in view of underlying dyspnea, he underwent echocardiogram and stress test.  Echocardiogram revealed mild to moderate TR, normal LVEF and otherwise no significant abnormalities.  Stress test was overall low risk.    He is now planning on a trip to go to [place], was seen by [place] about 3 months ago but wanted to be seen again.  No change in his physical exam, his dyspnea has remained stable, there is no evidence of right-sided heart failure by physical examination.  Blood pressure is well controlled, from IPF standpoint and secondary pulm hypertension, we can consider repeating echocardiogram prior to his next office visit in 6 months.  It has been greater than a year since his last echocardiogram.  No other changes in the medications were done today.  He is on a low-dose statin, his recent LFTs reviewed, minimal elevation in ALT of no clinical consequence.  I reassured him.  I will see him back in 6 months.  Lipids are well controlled.

## 2022-05-23 NOTE — Telephone Encounter (Signed)
Attempted to call pt but line again went directly to VM. Due to multiple times trying to call pt without being able to reach him, I have sent pt a mychart message with the info from MR.

## 2022-05-26 ENCOUNTER — Other Ambulatory Visit: Payer: Self-pay | Admitting: *Deleted

## 2022-05-26 ENCOUNTER — Other Ambulatory Visit (INDEPENDENT_AMBULATORY_CARE_PROVIDER_SITE_OTHER): Payer: Medicare Other

## 2022-05-26 DIAGNOSIS — J84112 Idiopathic pulmonary fibrosis: Secondary | ICD-10-CM

## 2022-05-26 LAB — HEPATIC FUNCTION PANEL
ALT: 24 U/L (ref 0–53)
AST: 35 U/L (ref 0–37)
Albumin: 3.6 g/dL (ref 3.5–5.2)
Alkaline Phosphatase: 78 U/L (ref 39–117)
Bilirubin, Direct: 0.1 mg/dL (ref 0.0–0.3)
Total Bilirubin: 0.6 mg/dL (ref 0.2–1.2)
Total Protein: 6.9 g/dL (ref 6.0–8.3)

## 2022-09-05 ENCOUNTER — Other Ambulatory Visit: Payer: Self-pay | Admitting: Internal Medicine

## 2022-09-05 DIAGNOSIS — J84112 Idiopathic pulmonary fibrosis: Secondary | ICD-10-CM

## 2022-09-06 ENCOUNTER — Encounter: Payer: Self-pay | Admitting: Internal Medicine

## 2022-09-08 ENCOUNTER — Telehealth: Payer: Self-pay | Admitting: Pharmacist

## 2022-09-08 ENCOUNTER — Other Ambulatory Visit: Payer: Self-pay | Admitting: Pharmacist

## 2022-09-08 DIAGNOSIS — J84112 Idiopathic pulmonary fibrosis: Secondary | ICD-10-CM

## 2022-09-08 MED ORDER — PIRFENIDONE 801 MG PO TABS: 801.0000 mg | ORAL_TABLET | Freq: Three times a day (TID) | ORAL | 0 refills | Status: DC

## 2022-09-08 NOTE — Telephone Encounter (Signed)
Refill sent for PIRFENIDONE to [facility]) for Esbriet: [phone]  Dose: 801 mg three times daily  Last OV: 05/17/2022 Provider: Dr. Chase Caller  Next OV: not scheduled, pt advised to schedule appt with Dr. Denyse Amass, PharmD, MPH, BCPS Clinical Pharmacist (Rheumatology and Pulmonology)

## 2022-09-28 ENCOUNTER — Ambulatory Visit (INDEPENDENT_AMBULATORY_CARE_PROVIDER_SITE_OTHER): Payer: Medicare Other | Admitting: Internal Medicine

## 2022-09-28 DIAGNOSIS — J84112 Idiopathic pulmonary fibrosis: Secondary | ICD-10-CM | POA: Diagnosis not present

## 2022-09-28 NOTE — Patient Instructions (Signed)
Spiro/DLCO performed today.  

## 2022-09-28 NOTE — Procedures (Signed)
Spiro/DLCO performed today.  

## 2022-10-06 ENCOUNTER — Encounter: Payer: Self-pay | Admitting: Internal Medicine

## 2022-10-06 ENCOUNTER — Ambulatory Visit: Payer: Medicare Other | Admitting: Internal Medicine

## 2022-10-06 VITALS — BP 92/56 | HR 87 | Temp 98.2°F | Ht 67.0 in | Wt 157.2 lb

## 2022-10-06 DIAGNOSIS — Z7189 Other specified counseling: Secondary | ICD-10-CM | POA: Diagnosis not present

## 2022-10-06 DIAGNOSIS — J84112 Idiopathic pulmonary fibrosis: Secondary | ICD-10-CM

## 2022-10-06 DIAGNOSIS — Z7184 Encounter for health counseling related to travel: Secondary | ICD-10-CM

## 2022-10-06 LAB — PULMONARY FUNCTION TEST
DL/VA % pred: 90 %
DL/VA: 3.98 ml/min/mmHg/L
DLCO cor % pred: 45 %
DLCO cor: 12.95 ml/min/mmHg
DLCO unc % pred: 45 %
DLCO unc: 12.95 ml/min/mmHg
FEF 25-75 Pre: 2.49 L/sec
FEF2575-%Pred-Pre: 131 %
FEV1-%Pred-Pre: 71 %
FEV1-Pre: 1.8 L
FEV1FVC-%Pred-Pre: 111 %
FEV6-%Pred-Pre: 61 %
FEV6-Pre: 2.11 L
FEV6FVC-%Pred-Pre: 97 %
FVC-%Pred-Pre: 63 %
FVC-Pre: 2.11 L
Pre FEV1/FVC ratio: 85 %
Pre FEV6/FVC Ratio: 100 %

## 2022-10-06 NOTE — Patient Instructions (Addendum)
IPF (idiopathic pulmonary fibrosis) ([place])  -slightly progressive dec 2020 -> may 2022 - > stable since starting esbriet summer 2022 -> through feb 2024  - glad tolerating esbriet well   Plan - - make PFT  for spiro and dlco appt in 4 months = check cbc bmet, LFT, bNP 10/06/2022 - continue esbriet  for IPF - at some point can discuss research as care option - best wishes for repeat [place] trip    Follow-up -  - next office visit In 4 months  but after PFT testing  - 30 min with Dr Alan Roman  = symptoms score and walk test at followup

## 2022-10-06 NOTE — Progress Notes (Signed)
NOV 2020 with Dr Elsworth Soho  77 year old never smoker of [place] origin initial consult 05/2019  for evaluation of hoarseness, dry cough and congestion. He is originally from [place], [place], [place] to the [place] in 1971 for his bachelors to [employer] and then settled in [place] in 1978 and got into the [employer].  PMH - - psoriatic arthritis by Dr. Metta Clines, was originally on Humira and is now on Cosentyx. - latent TB that was untreated initially and in 12/2012 he presented with a mild alveolitis on CT scan and pleural effusion that required thoracentesis, bronchoscopy was negative for AFB but he was treated under the guidance of local health department and ID with multiple drug antituberculous treatment for 3 months.  His problem lists extrapulmonary TB but on my review all AFB cultures were negative.   -07/2018 chest x-ray shows eventration of right hemidiaphragm.   - vocal cord paralysis in 04/2015 and underwent ENT evaluation for persistent dysphonia and underwent gel injection of the vocal cords at [facility] without any relief  Chief Complaint  Patient presents with   Follow-up    1 month rov- review CT chest 10/31.  pt states his s/s are unchanged since last month, prod cough with clear mucus qam that subsides throughout the day.      Symptoms are persistent. Main issues are hoarseness, dry cough and congestion  We reviewed HRCT in detail today  CT angiogram 10/2015 -which shows mild reticular densities suggestive but not diagnostic of ILD.  HRCT 05/31/19 mild, bibasilar predominant pulmonary fibrosis in a pattern featuring subtle subpleural reticular interstitial opacity, mild traction bronchiectasis and occasional areas of Bronchiolectasis - ' probable UIP ' - slight worse   OV Alan Roman Jan 3446  77 year old male never smoker presenting to our office today after completing pulmonary function testing.  Patient was last seen in our office in October/2020  for initial consult with Dr. Elsworth Soho.  At that office visit patient was scheduled for pulmonary function testing, lab work was completed to further evaluate patient's probable UIP on October/2020 high-resolution CT chest, patient was referred to ear nose and throat, swallow studies were also ordered on the patient.  Patient completed ENT referral in 07/02/2019 with Dr. Wellington Hampshire Recommendations for voice therapy, that he follow-up with gastroenterology regarding this Schatzki's ring.  Follow-up in April.  Proceed forward with pulmonary pulmonary function test.  Patient reports that he is received a telephone call to restart voice rehab but he has not done this yet as he wanted to review pulmonary function testing with our office first.  Patient swallow studies are listed below:  06/06/2019-swallow study-mild aspiration risk, no treatment recommended at this time 06/07/2019-barium swallow-Schatzki ring at the GE junction which prevents passage of a 13 mm barium tablet  Patient has a gastroenterologist Dr. Collene Mares but he has not seen her in over a year.  Patient has no scheduled upcoming appointments.  Patient continues to follow-up with rheumatology, Dr. Estanislado Pandy.  Per chart review last office note was in July/2020.  Patient presenting today to review December/2020 pulmonary function test.  Those results are listed below:  07/19/2019-pulmonary function test-FVC 2.32 (63% predicted), postbronchodilator ratio 93, postbronchodilator FEV1 1.93 (69% predicted), DLCO 13.59 (43% predicted) >>>Severe restriction, moderately severe diffusion defect  Lab work from [place] are also listed below:  06/03/2019-ANA-1: 40, nuclear homogenous 06/03/2019-rheumatoid factor-14 06/03/2019-anti-Jo 1-less than 0.2 06/03/2019-aldolase-4.9 06/03/2019-centromere antibodies-negative 06/03/2019-antiscleroderma antibody-less than 1 - 06/03/2019-Sjogren's syndrome 8 and B-negative 06/03/2019-hypersensitivity  pneumonitis-negative 06/03/2019-sed rate  22  Mildly elevated ANA and rheumatoid factor.  Felt to be not clinically significant.  Rest of results are negative.  Patient continues to report that he is asymptomatic.  Patient tolerates walk in office without any oxygen desaturations.  He does continue to have occasional bouts of congestion which is believed to be related to the Cosentyx.  Family has messaged rheumatology regarding this.  Tests:   07/19/2019-pulmonary function test-FVC 2.32 (63% predicted), postbronchodilator ratio 93, postbronchodilator FEV1 1.93 (69% predicted), DLCO 13.59 (43% predicted) >>>Severe restriction, moderately severe diffusion defect  07/16/2019-SARS-CoV-2-not detected  06/03/2019-ANA-1: 40, nuclear homogenous 06/03/2019-rheumatoid factor-14 06/03/2019-anti-Jo 1-less than 0.2 06/03/2019-aldolase-4.9 06/03/2019-centromere antibodies-negative 06/03/2019-antiscleroderma antibody-less than 1 - 06/03/2019-Sjogren's syndrome 8 and B-negative 06/03/2019-hypersensitivity pneumonitis-negative 06/03/2019-sed rate 22  06/01/2019-CT chest high-res mild, bibasilar predominant pulmonary fibrosis in a pattern with featuring subtle subpleural reticular interstitial opacity and mild traction bronchiectasis occasional areas of bronchiolectasis, probable UIP  06/06/2019-swallow study-mild aspiration risk, no treatment recommended at this time  06/07/2019-barium swallow-Schatzki ring at the GE junction which prevents passage of a 13 mm barium tablet  FENO:  No results found for: NITRICOXIDE  MH: Psoriatic arthritis (Dr. Estanislado Pandy - prev on Humira, now on Cosentyx), latent TB that was untreated initially in 12/2012 with mild alveolitis on CT scan and pleural effusion that required thoracentesis, bronchoscopy was negative for AFB but he was treated under guidance with local health department infectious disease with multidrug antituberculosis treatment for 3 months, eventration of right  hemidiaphragm, vocal cord paralysis in September/2016 underwent ENT evaluation for persistent dysphonia and underwent gel injection of vocal cords at [facility] without any relief Smoker/ Smoking History: Never smoker Maintenance: None   He is originally from [place], [place], [place] to the [place] in [place] for his bachelors to [employer] and then settled in [place] in 1978 and got into [employer] business.    OV 11/10/2020  Subjective:  Patient ID: Alan Roman, male , DOB: [birth year] , age 23 y.o. , MRN: [id number] , ADDRESS: [place] [id number] PCP Glenda Chroman, MD Patient Care Team: Glenda Chroman, MD as PCP - General (Internal Medicine)  This Provider for this visit: Treatment Team:  Attending Provider: Brand Males, MD    11/10/2020 -   Chief Complaint  Patient presents with   Follow-up    Increased coughing for past 3 months, sometimes productive with whitish sputum. Denies shortness of breath.      HPI [facility] 77 y.o. -asked by Dr. Einar Gip to establish at the pulmonary fibrosis center.  Therefore patient is seeing Dr. Chase Caller.  Patient used to be previously followed by Dr. Elsworth Soho.  Last seen by Dr. Elsworth Soho in 2020.  Then seen by nurse practitioner Alan Roman in January 2021.   His daughtre Guilford Shi later joined on the phone call.  Patient is known to have ILD probable UIP pattern.  Is on observation at this point.   [place] Integrated Comprehensive ILD Questionnaire  Symptoms:  -   he reports insidious onset of dyspnea for the last 4 months.  Since it started it is the same.  Although his ILD is present even before that.  He says that he has a very mild cough however in the last 4 to 6 months it is worse.  Particularly when he talks for a long time.  He does not wake up in the middle of the night because of the cough he does bring up some white sputum that is very light white.  Cough does  not affect his voice.  He does clear his  throat.  He has never had hemoptysis.  He does not cough when he lies down.  No nausea vomiting or diarrhea.  No wheezing.  Cough does get worse with eating  Past Medical History : He has psoriatic arthritis.  He is immunosuppressed and is on immunomodulators.  Currently on Simponi.  He does not have rheumatoid arthritis.  No heart failure.  No other collagen vascular disease of connective tissue disease.  No COPD.  No asthma.  No HIV.  No hiatal hernia.  No pulmonary hypertension.  No diabetes.  No thyroid disease.  No stroke no seizures.  No tuberculosis no kidney disease no pneumonia.  No blood clots no heart disease no pleurisy.   He is deemed to be an aspiration risk he has vocal cord paralysis.  He is to see Dr. Collene Mares GI.  He wants to switch to [facility] but because of the pandemic has not been able to establish.  ROS: He does not have any fatigue.  No arthralgia.  No  dysphagia.  No Raynaud's.  No dry eyes no fever no weight loss.  No nausea no vomiting.  No heartburn no snoring no rash no ulcers.   FAMILY HISTORY of LUNG DISEASE: No pulmonary fibrosis.  No COPD.  No asthma no sarcoid no cystic fibrosis no hypersensitive pneumonitis no autoimmune disease in the family.    EXPOSURE HISTORY: No tobacco.  No smoking cigars no smoking pipes.  No electronic cigarette use.  No vaping marijuana.  No cocaine use.  No intravenous drug use.   HOME and HOBBY DETAILS : He lives in a 78 year old family home.  He is lived there for 40 years.  No damp environment.  No mold or mildew.  No humidifier use no CPAP use no nebulizer use.  No steam iron use.  No Jacuzzi use.  No misting Fountain.  No pet birds or parakeets.  No feather pillows or blankets.  No mold in the [facility] duct.  No music habits no gardening habits.  Never exposed to birds or chickens.  No water damage in the basement.  No straw meds.  No hot tub use.  No animals at work.    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  Organic antigen exposure history is negative.  Inorganic antigen exposure history is negative.   PULMONARY TOXICITY HISTORY (27 items):  Simponi for psoriatic arthritis      .OV 12/18/2020  Subjective:  Patient ID: Alan Roman, male , DOB: 1945/09/05 , age 23 y.o. , MRN: [id number] , ADDRESS: [place] [id number] PCP Glenda Chroman, MD Patient Care Team: Glenda Chroman, MD as PCP - General (Internal Medicine) Adrian Prows, MD as PCP - Cardiology (Cardiology)  This Provider for this visit: Treatment Team:  Attending Provider: Brand Males, MD    12/18/2020 -   Chief Complaint  Patient presents with   Follow-up    PFT performed today, HRCT performed 4/25, and EGD scheduled 5/23.  Pt states he has been okay since last visit. States his only concern is hoarseness with voice and an occ cough.     HPI [facility]inedine [facility] 77 y.o. -returns for follow-up to discuss his test results.  He continues to have hoarse voice.  He denies any dysphagia.  He is now scheduled for endoscopy 12/21/2020.  He is going to see cardiology ahead of  that.  His serologies negative.  His symptoms continue to be minimal and stable.  He had high-resolution CT scan of the chest that shows progression compared to October 2020.  It is described as probable UIP.  Back in October 2020 there was craniocaudal gradient described and no air trapping.  This time April 2022 radiologist feels there is slight upper lobe predominance and there is air trapping.  This latter description raises the question of possible chronic hypersensitive pneumonitis even though the final conclusion was probable UIP.  I went over exposure history with him again.  No organic antigen exposure history identified.  Nevertheless disease appears progressive.    CT chest HIgh res 11/23/20   IMPRESSION: CLINICAL DATA:  77 year old male with history of bronchiectasis. Follow-up study.   EXAM: CT  CHEST WITHOUT CONTRAST   TECHNIQUE: Multidetector CT imaging of the chest was performed following the standard protocol without intravenous contrast. High resolution imaging of the lungs, as well as inspiratory and expiratory imaging, was performed.   COMPARISON:  Chest CT 05/31/2019.   FINDINGS: Cardiovascular: Heart size is normal. There is no significant pericardial fluid, thickening or pericardial calcification. There is aortic atherosclerosis, as well as atherosclerosis of the great vessels of the mediastinum and the coronary arteries, including calcified atherosclerotic plaque in the left main, left anterior descending and left circumflex coronary arteries.   Mediastinum/Nodes: No pathologically enlarged mediastinal or hilar lymph nodes. Please note that accurate exclusion of hilar adenopathy is limited on noncontrast CT scans. Esophagus is unremarkable in appearance. No axillary lymphadenopathy.   Lungs/Pleura: High-resolution images demonstrate widespread patchy regions of ground-glass attenuation, septal thickening, mild cylindrical bronchiectasis and peripheral bronchiolectasis. Findings have no discernible craniocaudal gradient, and there is significant involvement in the anterior aspects of the upper lobes of the lungs bilaterally. No frank honeycombing. Inspiratory and expiratory imaging demonstrates some mild air trapping indicative of mild small airways disease. Minimal progression compared to the prior examination. No acute consolidative airspace disease. No pleural effusions. No suspicious appearing pulmonary nodules or masses are noted.   Upper Abdomen: Aortic atherosclerosis. 5 mm calcified gallstone in the neck of the gallbladder. Small calcified granuloma in the spleen incidentally noted.   Musculoskeletal: There are no aggressive appearing lytic or blastic lesions noted in the visualized portions of the skeleton 1. The appearance of the lungs is  compatible with interstitial lung disease, with a spectrum of findings once again categorized as probable usual interstitial pneumonia (UIP) per current ATS guidelines. Minimal progression of disease compared to the prior examination. 2. Aortic atherosclerosis, in addition to left main and 2 vessel coronary artery disease. Assessment for potential risk factor modification, dietary therapy or pharmacologic therapy may be warranted, if clinically indicated. 3. Cholelithiasis.   Aortic Atherosclerosis (ICD10-I70.0).     Electronically Signed   By: Vinnie Langton M.D.   On: 11/24/2020 08:04   xxxx Results for Alan Roman, Alan "JAGDISH" (MRN [id number]) as of 12/18/2020 16:47  Ref. Range 06/03/2019 10:42  Aspergillus Fumigatus Latest Ref Range: NEGATIVE  NEGATIVE  Pigeon Serum Latest Ref Range: NEGATIVE  NEGATIVE  Anti Nuclear Antibody (ANA) Latest Ref Range: NEGATIVE  POSITIVE (A)  ANA Pattern 1 Unknown Nuclear, Homogeneous (A)  ANA Titer 1 Latest Units: titer 1:40 (H)  Anti JO-1 Latest Ref Range: 0.0 - 0.9 AI <0.2  CENTROMERE AB SCREEN Latest Ref Range: <1.0 NEG AI <1.0 NEG  RA Latex Turbid. Latest Ref Range: <14 IU/mL 14 (H)  SSA (Ro) (ENA) Antibody, IgG Latest Ref Range: <  1.0 NEG AI <1.0 NEG  SSB (La) (ENA) Antibody, IgG Latest Ref Range: <1.0 NEG AI <1.0 NEG  Scleroderma (Scl-70) (ENA) Antibody, IgG Latest Ref Range: <1.0 NEG AI <1.0 NEG    OV 01/21/2021  Subjective:  Patient ID: Alan Roman, male , DOB: 1946-04-09 , age 5 y.o. , MRN: [id number] , ADDRESS: [place] [phone] PCP Glenda Chroman, MD Patient Care Team: Glenda Chroman, MD as PCP - General (Internal Medicine) Adrian Prows, MD as PCP - Cardiology (Cardiology)  This Provider for this visit: Treatment Team:  Attending Provider: Brand Males, MD    01/21/2021 -   Chief Complaint  Patient presents with   Follow-up    Pt states he has been doing okay since last visit.  States that he has not started on the OFEV medication yet.     HPI [facility] 77 y.o. -presents for follow-up in this visit he was supposed to have started nintedanib by now.  However he is yet to start this.  There are  supply chain issues.  But he says it is approved.  Overall no change in symptoms.  Unclear if he is finished his GI work-up.  He continues to have some hoarse voice.  He has not seen ENT.  He is accepted my recommendation to have a referral.  He did asked me whether a few episode of chlorine exposure acutely 40 years ago could have caused his pulmonary fibrosis.  I told him while it was possible it is unlikely.  Unclear about single episode although possible as be seen with COVID   02/18/2021 Patient contacted today for 4 week follow-up. He started OFEV anti-fibrotic on June 30th. He developed headache and diarrhea after 2 days of starting medication. He stopped taking OFEV 4 days ago d/t GI symptoms and fatigue. He also noted that his blood pressure was elevated. His symptoms have resolved since stopping medication.    MDD confderence Sept 2022  Dr Polly Cobia - November 23, 2020 - HRCT - compared to CTA 2017 and HRCT 2020. Recent CT  moderage patchy retic, and some GGO. TB is mild . Findings are distriubted evenly - no CCG.  No frank HC. clearly tehre is fibrotic ILD. Air trapping is ver minimal and in lower lobes and is physiologic and not clinicall signifcant. No AT in upper lobe or middle lobe/lingulare   Final DX: Prob UIP and ther is mild progression since 2020 and defintiey since 2017.   Clinical dx:  IPF and Rx as such   OV 04/20/2021  Subjective:  Patient ID: Alan Roman, male , DOB: [birth year] , age 74 y.o. , MRN: [id number] , ADDRESS: [place] [id number] PCP Glenda Chroman, MD Patient Care Team: Glenda Chroman, MD as PCP - General (Internal Medicine) Adrian Prows, MD as PCP - Cardiology (Cardiology)  This Provider for this visit:  Treatment Team:  Attending Provider: Brand Males, MD    04/20/2021 -   Chief Complaint  Patient presents with   Follow-up    Pt states his BP is still increasing even after going to 1 pill of the OFEV a day.   IPF: age greater than 65, Velcro crackles at the base, previous description of probable UIP and nearly negative serology [psoriatic arthritis does not count is connective tissue disease ILD], vocal cord paralysis with some aspiration risk that this is IPF.   - Dx given June 2022 - Confirmd MDD conferne Sept 2022   -  Ofev June 2022  HPI [facility] 77 y.o. -presents for follow-up.  I am meeting his wife for the first time.  He feels he is stable.  He is walking 30 minutes in the mornings and he can do this without stopping.  He says his dyspnea is not worse and his cough is not worse but objectively on the subjective symptom questionnaire there might be progression.  We did discuss his case in the ILD conference.  The diagnosis is IPF.  There is progression.  In terms of therapeutics we started him on nintedanib at 150 mg twice daily but he called saying that he was intolerant to it because of hypertension [anecdotally he is my first patient with hypertension in 6 years with nintedanib] but also diarrhea and fatigue.  He then stopped it and apparently his blood pressure normalized.  Then 04/10/2021 he started himself on nintedanib 100 mg strength.  He is currently doing only 1 time daily.  He feels his blood pressure is going up again to [id number] systolic.  [At 150 twice a day it was at [id number] systolic] and this is no normal.  He is not having any other symptoms.  We discussed the option of switching to pirfenidone and going through approval process again.  We discussed the side effects of skin issues, fatigue, nausea, anorexia and ongoing liver monitoring with significant overlap with nintedanib side effects.  At this point in time he is resolved to increase his nintedanib to 100 mg  twice daily and if he is tolerating well and that go to 150 mg twice daily.  If this fails then he will go to pirfenidone.  End of November 2022 he is going to go to [place] till February 2022.  He will have a high-dose flu shot today.         OV 06/07/2021  Subjective:  Patient ID: Alan Roman, male , DOB: Nov 24, 1945 , age 63 y.o. , MRN: [id number] , ADDRESS: [place] [id number] PCP Glenda Chroman, MD Patient Care Team: Glenda Chroman, MD as PCP - General (Internal Medicine) Adrian Prows, MD as PCP - Cardiology (Cardiology)  This Provider for this visit: Treatment Team:  Attending Provider: Brand Males, MD    06/07/2021 -   Chief Complaint  Patient presents with   Follow-up    4wk f/u after starting Ofev. Stopped the Ofev about 3 weeks ago due to increased blood pressure. Plans to restart it next Monday. Denies any increased nausea, diarrhea or loss of appetite.    IPF: age greater than 65, Velcro crackles at the base, previous description of probable UIP and nearly negative serology [psoriatic arthritis does not count is connective tissue disease ILD], vocal cord paralysis with some aspiration risk that this is IPF.   - Dx given June 2022 - Confirmd MDD conferne Sept 2022   - Ofev June 2022 - stopped Oct/Nov 2022 due to hypertension  HPI [facility] 77 y.o. -returns for his IPF follow-up.  This particular visit is to focus on how he is tolerating his nintedanib.  He is extremely concerned his nintedanib is causing hypertension.  When he was taking 100 mg which is the lower dose at 1 pill/day he did not have hypertension.  He then increase it to 100 mg twice daily [this is the lowest therapeutic dose].  He says this caused his blood pressure systolic to go up to 0000000.  He has since stopped taking all nintedanib.  His blood pressure  is now normal.  He does not want take nintedanib anymore.  We discussed the alternative of taking  pirfenidone.  He is open to this idea.  Pirfenidone is not known to cause hypertension.  In fact even at nintedanib I am not seen any patient developed hypertension except him.  We discussed the other side effects of pirfenidone including GI issues, weight loss, nausea, liver function test monitoring and necessity for sunscreen.  He is willing to do all this.  He has upcoming travel to [place] towards the end of November 2022 through February 2023.  We explained that not taking the antifibrotic but somewhat less than 5% chance of progression over 3 months.  He does not want to take this risk because of prior progression.  He just wants to take the medication pirfenidone.  So we will make a request for this.  I did advise him to register himself with a pulmonologist and his home city in [place].  I also spoke to his daughter over the phone.  They will arrange this.  He is willing to do video and telephone visit with me when he is in [place].  Of note he developed back pain with right-sided sciatica 3 weeks ago.  He has right-sided sciatica.  He had an MRI that shows DJD findings.  He will follow-up with Dr. Durward Fortes orthopedics for this.  He and his wife and daughter Derek Jack on the phone] feel that he may not make it [place] because of the back pain.  I advised her to talk about this with Dr. Durward Fortes.  Of note he has upcoming pulmonary function test appointment in 1 week.  He also has office visit.  We discussed this with him.  He will reschedule his office visit as a televisit to discuss his pirfenidone uptake and his pulmonary function test combined.  He will do this from [place].      OV 06/15/2021  Subjective:  Patient ID: Alan Roman, male , DOB: November 27, 1945 , age 43 y.o. , MRN: [id number] , ADDRESS: [address] [place] [address] PCP Glenda Chroman, MD Patient Care Team: Glenda Chroman, MD as PCP - General (Internal Medicine) Adrian Prows, MD as PCP - Cardiology (Cardiology)  This  Provider for this visit: Treatment Team:  Attending Provider: Brand Males, MD    06/15/2021 -   Chief Complaint  Patient presents with   Follow-up    Pft done today. No new concerns     HPI [facility] 77 y.o. -returns for follow-up.  His wife is here with him.  His symptom score shows minimal symptoms and is actually better.  His pulmonary function test shows slight worsening compared to 2 years ago.  But he is not feeling this.  He is off his nintedanib and is feeling better.  He is decided to start pirfenidone.  I did indicate to him that his progression.  He is here to start.  Pirfenidone because although approved the shipment is pending.  I sent a message to the pharmacist to see when his shipment would arrive.  His weight is stable.  He is doing some physical therapy for his back.  He is going to see Dr. Durward Fortes tomorrow.  He will register with a  pulmonologist in [place].  While in [place] once his [place] pirfenidone runs he will get local pirfenidone.  He will get this through his pulmonologist there.       OV 05/17/2022  Subjective:  Patient ID: Alan Roman,  male , DOB: Jul 08, 1946 , age 32 y.o. , MRN: [id number] , ADDRESS: [address] [place] [address] PCP Glenda Chroman, MD Patient Care Team: Glenda Chroman, MD as PCP - General (Internal Medicine) Adrian Prows, MD as PCP - Cardiology (Cardiology)  This Provider for this visit: Treatment Team:  Attending Provider: Brand Males, MD  05/17/2022 -   Chief Complaint  Patient presents with   Follow-up    Doing well today. No sx noted     HPI [facility] 77 y.o. -returns for follow-up.  Last seen almost a year ago.  After that he went to [place] and came back in February 2023.  Then after coming back from [place] he was in [place] with his son for several months and then spending a lot of time in [place] with his grandson.  Because of this he did not make follow-up visits  with [place].  While in [place] he was seen by a physician who he knows personally.  During this entire time it appears there was no liver function test monitoring.  I asked him why he missed his appointments with [place].  He states that after he came back from [place] he has been spending time in different places.  I did indicate to him that close follow-up is needed.  In any event symptoms are stable he is tolerating pirfenidone really well.  There is only a 2 pound difference since the last year.  He is going to go back to [place] again later this month and return in February 2024.  His close friend who is also my patient with IPF recently had COVID-related IPF flareup.  He did mention this to me.  Of note he is status flu shot and also COVID booster according to his history.  He plans to have RSV vaccine before his travel to [place].     OV 10/06/2022  Subjective:  Patient ID: Alan Roman, male , DOB: 08/23/1945 , age 5 y.o. , MRN: [id number] , ADDRESS: [place] [id number] PCP Glenda Chroman, MD Patient Care Team: Glenda Chroman, MD as PCP - General (Internal Medicine) Adrian Prows, MD as PCP - Cardiology (Cardiology)  This Provider for this visit: Treatment Team:  Attending Provider: Brand Males, MD  IPF: age greater than 65, Velcro crackles at the base, previous description of probable UIP and nearly negative serology [psoriatic arthritis does not count is connective tissue disease ILD], vocal cord paralysis with some aspiration risk that this is IPF.   - Dx given June 2022 - Confirmd MDD conferne Sept 2022   - Ofev June 2022 - stopped Oct/Nov 2022 due to hypertension  -Pirfenidone start November 2022   10/06/2022 -   Chief Complaint  Patient presents with   Follow-up    Pft review, no c/o      HPI [facility] 77 y.o. -returns for follow-up.  Last seen towards the end of last year.  After that he went for an extended trip to [place] to his native state.  He  says he did really well in [place].  The only issue was that 1 time he had inguinal pain on the left lower quadrant was seen by urologist and diagnosed with bilateral inguinal hernia.  He is followed up with his primary care physician has been reassured.  Otherwise no medical problems no changes in medications no ER visits no urgent care visits.  He is tolerating pirfenidone at this full dose quite well.  Shortness of breath has not changed.  In fact his walking desaturation test today stable.  Symptom scores are stable.  He also had pulmonary function test that shows continued stability in FVC and DLCO since starting pirfenidone.  He has not lost any weight.  He states he is up-to-date with his respiratory vaccines.  He has an upcoming repeat trip to [place] for 5 weeks because he is building a house.  We briefly discussed clinical trials as a care option and he is interested    SYMPTOM SCALE - ILD 11/10/2020 and 12/18/2020   01/21/2021  04/20/2021 ofev 06/15/2021 158# - off ofev. Going to start esbriet 05/17/2022 156#ra 10/06/2022 157#  O2 use ra ra ra ra ra ra  Shortness of Breath 0 -> 5 scale with 5 being worst (score 6 If unable to do)       At rest 0 0 0 0 0 0  Simple tasks - showers, clothes change, eating, shaving 0 0 0 0 0 0  Household (dishes, doing bed, laundry) 0 1 2 0 0 0  Shopping 0 0 0 0 0 0  Walking level at own pace 1 0 1 0 0 0  Walking up Stairs '1 1 1 1 1 2  '$ Total (30-36) Dyspnea Score '2 2 4 1 1 2  '$ How bad is your cough? 1 1 2.5 0 1 1  How bad is your fatigue x 0 0 0 0 0  How bad is nausea 0 0 0 0 o 0  How bad is vomiting?  0 00 0 0 0 000  How bad is diarrhea? 0  0 0 0 0  How bad is anxiety? x 0 0 0 0 0  How bad is depression x 0 0 0 0 00    Simple office walk 185 feet x  3 laps goal with forehead probe 11/10/2020  06/07/2021  05/17/2022 With 10/06/2022   O2 used ra ra Walk test ra ra  Number laps completed '3 3 3 3  '$ Comments about pace avg  Mo dpace brisk  Resting Pulse  Ox/HR 100% and 66/min 99% and HR 79 100% and= HR 56 100% and HR 87  Final Pulse Ox/HR 100% and 103/min 97% and HR 102 100% and HR 105 100% and HR 104  Desaturated </= 88% no no no   Desaturated <= 3% points no no no   Got Tachycardic >/= 90/min yes yes yes   Symptoms at end of test none Mild dyspnea No complaints none  Miscellaneous comments x stabe stable stabte    Modified Six Minute Walk - 10/06/22 1500     Type of O2 used  Room Air    Number of laps completed  3    Lap Pace Brisk    Resting Heartrate 87 bpm    Final Heartrate 104 bpm    Resting Pulse Ox 100 %    Desaturated to <= 3 points No    Desaturated to < 88% No    Became tachycardic No    comments pt did great on his walk.                PFT     Latest Ref Rng & Units 09/28/2022  06/15/2021   12:51 PM 12/18/2020    2:23 PM 07/19/2019    1:38 PM  PFT Results  FVC-Pre L 2.11 2.07  1.98  2.32   FVC-Predicted Pre %  62  59  63   FVC-Post  L   2.01  2.06   FVC-Predicted Post %   60  56   Pre FEV1/FVC % %  83  89  83   Post FEV1/FCV % %   92  93   FEV1-Pre L  1.72  1.76  1.94   FEV1-Predicted Pre %  67  69  69   FEV1-Post L   1.84  1.93   DLCO uncorrected ml/min/mmHg 12.95 12.64  14.24  13.59   DLCO UNC% %  44  50  43   DLCO corrected ml/min/mmHg  12.64  14.24    DLCO COR %Predicted %  44  50    DLVA Predicted %  75  104  90   TLC L   3.60  3.80   TLC % Predicted %   55  55   RV % Predicted %   34  60        has a past medical history of Acute bronchiolitis due to other infectious organisms (01/26/2013), Arthritis, Extrapulmonary TB (tuberculosis) (02/02/2013), GERD (gastroesophageal reflux disease), High risk medication use (12/27/2016), History of hiatal hernia (2007; 2009), Hyperlipidemia, Hypertension, and Pleural effusion (01/26/2013).   reports that he has never smoked. He has never used smokeless tobacco.  Past Surgical History:  Procedure Laterality Date   BIOPSY  04/01/2021   Procedure:  BIOPSY;  Surgeon: Ladene Artist, MD;  Location: [facility];  Service: Endoscopy;;   CATARACT EXTRACTION W/ INTRAOCULAR LENS  IMPLANT, BILATERAL Bilateral    ESOPHAGOGASTRODUODENOSCOPY (EGD) WITH PROPOFOL N/A 04/01/2021   Procedure: ESOPHAGOGASTRODUODENOSCOPY (EGD) WITH PROPOFOL;  Surgeon: Ladene Artist, MD;  Location: [facility];  Service: Endoscopy;  Laterality: N/A;   HIATAL HERNIA REPAIR  2007; 2009   SAVORY DILATION N/A 04/01/2021   Procedure: SAVORY DILATION;  Surgeon: Ladene Artist, MD;  Location: [facility];  Service: Endoscopy;  Laterality: N/A;   TOTAL KNEE ARTHROPLASTY Left 11/03/2015   TOTAL KNEE ARTHROPLASTY Left 11/03/2015   Procedure: Left Total Knee Arthroplasty;  Surgeon: Garald Balding, MD;  Location: [place];  Service: Orthopedics;  Laterality: Left;   TOTAL KNEE ARTHROPLASTY Right 07/17/2018   Procedure: RIGHT TOTAL KNEE ARTHROPLASTY;  Surgeon: Garald Balding, MD;  Location: [place];  Service: Orthopedics;  Laterality: Right;   VIDEO BRONCHOSCOPY Bilateral 01/31/2013   Procedure: VIDEO BRONCHOSCOPY WITHOUT FLUORO;  Surgeon: Wilhelmina Mcardle, MD;  Location: [facility] END[facility]COPY;  Service: Cardiopulmonary;  Laterality: Bilateral;    Allergies  Allergen Reactions   Ofev [Nintedanib] Hypertension    Immunization History  Administered Date(s) Administered   Fluad Quad(high Dose 65+) 04/20/2021   Influenza, High Dose Seasonal PF 04/22/2019   Influenza-Unspecified 05/10/2022   PPD Test 04/06/2010   Tdap 05/30/2017    Family History  Problem Relation Age of Onset   Colon cancer Neg Hx    Stomach cancer Neg Hx    Esophageal cancer Neg Hx    Colon polyps Neg Hx    Rectal cancer Neg Hx      Current Outpatient Medications:    aspirin (ASPIRIN CHILDRENS) 81 MG chewable tablet, Chew 1 tablet (81 mg total) by mouth daily., Disp: , Rfl:    losartan (COZAAR) 50 MG tablet, Take 1 tablet (50 mg total) by mouth daily., Disp: 90 tablet, Rfl: 1   methotrexate 2.5 MG  tablet, Take 3 tablets by mouth once a week., Disp: , Rfl:    multivitamin-iron-minerals-folic acid (CENTRUM) chewable tablet, Chew 1 tablet by mouth daily., Disp: ,  Rfl:    omeprazole (PRILOSEC) 20 MG capsule, TAKE 1 CAPSULE(20 MG) BY MOUTH DAILY, Disp: 90 capsule, Rfl: 1   Pirfenidone 801 MG TABS, Take 1 tablet (801 mg total) by mouth with breakfast, with lunch, and with evening meal., Disp: 270 tablet, Rfl: 0   rosuvastatin (CRESTOR) 5 MG tablet, TAKE 1 TABLET(5 MG) BY MOUTH DAILY, Disp: 90 tablet, Rfl: 3   Triamcinolone Acetonide (TRIAMCINOLONE 0.1 % CREAM : EUCERIN) CREA, Apply 1 application topically 2 (two) times daily., Disp: 1 each, Rfl: 0      Objective:   Vitals:   10/06/22 1500  BP: (!) 92/56  Pulse: 87  Temp: 98.2 F (36.8 C)  TempSrc: Oral  SpO2: 100%  Weight: 157 lb 3.2 oz (71.3 kg)  Height: '5\' 7"'$  (1.702 m)    Estimated body mass index is 24.62 kg/m as calculated from the following:   Height as of this encounter: '5\' 7"'$  (1.702 m).   Weight as of this encounter: 157 lb 3.2 oz (71.3 kg).  '@WEIGHTCHANGE'$ @  [employer]   10/06/22 1500  Weight: 157 lb 3.2 oz (71.3 kg)     Physical Exam    General: No distress. Looks well Neuro: Alert and Oriented x 3. GCS 15. Speech normal Psych: Pleasant Resp:  Barrel Chest - no.  Wheeze - no, Crackles - yes, base No overt respiratory distress CVS: Normal heart sounds. Murmurs - no Ext: Stigmata of Connective Tissue Disease - no HEENT: Normal upper airway. PEERL +. No post nasal drip        Assessment:       ICD-10-CM   1. IPF (idiopathic pulmonary fibrosis) ([place])  J84.112     2. Encounter for medication counseling  Z71.89     3. Travel advice encounter  Z71.84          Plan:     Patient Instructions  IPF (idiopathic pulmonary fibrosis) ([place])  -slightly progressive dec 2020 -> may 2022 - > stable since starting esbriet summer 2022 -> through feb 2024  - glad tolerating esbriet well   Plan - - make  PFT  for spiro and dlco appt in 4 months = check cbc bmet, LFT, bNP 10/06/2022 - continue esbriet  for IPF - at some point can discuss research as care option - best wishes for repeat [place] trip    Follow-up -  - next office visit In 4 months  but after PFT testing  - 30 min with Dr Chase Caller  = symptoms score and walk test at followup    SIGNATURE    Dr. Brand Males, M.D., F.C.C.P,  Pulmonary and Critical Care Medicine Staff Physician, [place] Director - Interstitial Lung Disease  Program  Pulmonary [place] [facility] [place], [place], [address]  Pager: [phone], If no answer or between  15:00h - 7:00h: call [phone] Telephone: [phone]  3:47 PM 10/06/2022

## 2022-10-07 LAB — BRAIN NATRIURETIC PEPTIDE: Pro B Natriuretic peptide (BNP): 41 pg/mL (ref 0.0–100.0)

## 2022-10-07 LAB — CBC WITH DIFFERENTIAL/PLATELET
Basophils Absolute: 0 10*3/uL (ref 0.0–0.1)
Basophils Relative: 0.7 % (ref 0.0–3.0)
Eosinophils Absolute: 0.2 10*3/uL (ref 0.0–0.7)
Eosinophils Relative: 3.7 % (ref 0.0–5.0)
HCT: 40.5 % (ref 39.0–52.0)
Hemoglobin: 14 g/dL (ref 13.0–17.0)
Lymphocytes Relative: 24.7 % (ref 12.0–46.0)
Lymphs Abs: 1.5 10*3/uL (ref 0.7–4.0)
MCHC: 34.5 g/dL (ref 30.0–36.0)
MCV: 98.8 fl (ref 78.0–100.0)
Monocytes Absolute: 0.6 10*3/uL (ref 0.1–1.0)
Monocytes Relative: 9.9 % (ref 3.0–12.0)
Neutro Abs: 3.6 10*3/uL (ref 1.4–7.7)
Neutrophils Relative %: 61 % (ref 43.0–77.0)
Platelets: 209 10*3/uL (ref 150.0–400.0)
RBC: 4.1 Mil/uL — ABNORMAL LOW (ref 4.22–5.81)
RDW: 13.2 % (ref 11.5–15.5)
WBC: 5.9 10*3/uL (ref 4.0–10.5)

## 2022-10-07 LAB — BASIC METABOLIC PANEL
BUN: 20 mg/dL (ref 6–23)
CO2: 28 mEq/L (ref 19–32)
Calcium: 9 mg/dL (ref 8.4–10.5)
Chloride: 104 mEq/L (ref 96–112)
Creatinine, Ser: 1.27 mg/dL (ref 0.40–1.50)
GFR: 54.3 mL/min — ABNORMAL LOW (ref >60.00)
Glucose, Bld: 107 mg/dL — ABNORMAL HIGH (ref 70–99)
Potassium: 4.1 mEq/L (ref 3.5–5.1)
Sodium: 138 mEq/L (ref 135–145)

## 2022-10-07 LAB — HEPATIC FUNCTION PANEL
ALT: 26 U/L (ref 0–53)
AST: 36 U/L (ref 0–37)
Albumin: 3.6 g/dL (ref 3.5–5.2)
Alkaline Phosphatase: 83 U/L (ref 39–117)
Bilirubin, Direct: 0.1 mg/dL (ref 0.0–0.3)
Total Bilirubin: 0.4 mg/dL (ref 0.2–1.2)
Total Protein: 6.8 g/dL (ref 6.0–8.3)

## 2022-12-14 ENCOUNTER — Other Ambulatory Visit: Payer: Self-pay | Admitting: Pharmacist

## 2022-12-14 DIAGNOSIS — J84112 Idiopathic pulmonary fibrosis: Secondary | ICD-10-CM

## 2022-12-14 MED ORDER — PIRFENIDONE 801 MG PO TABS: 801.0000 mg | ORAL_TABLET | Freq: Three times a day (TID) | ORAL | 1 refills | Status: DC

## 2022-12-14 NOTE — Telephone Encounter (Signed)
Refill sent for ESBRIET to [facility] ([facility]) for Esbriet: [phone]  Dose: 801 mg three times daily  Last OV: 10/06/22 Provider: Dr. Marchelle Gearing  LFTs on 10/06/22 wnl  Next OV: 02/03/23  Chesley Mires, PharmD, MPH, BCPS Clinical Pharmacist (Rheumatology and Pulmonology)

## 2022-12-15 ENCOUNTER — Ambulatory Visit: Payer: Medicare Other

## 2022-12-15 DIAGNOSIS — I2729 Other secondary pulmonary hypertension: Secondary | ICD-10-CM

## 2022-12-15 DIAGNOSIS — J849 Interstitial pulmonary disease, unspecified: Secondary | ICD-10-CM

## 2022-12-22 ENCOUNTER — Encounter: Payer: Self-pay | Admitting: Cardiology

## 2022-12-22 ENCOUNTER — Ambulatory Visit: Payer: Medicare Other | Admitting: Cardiology

## 2022-12-22 ENCOUNTER — Ambulatory Visit: Payer: Medicare Other | Admitting: Student

## 2022-12-22 VITALS — BP 109/64 | HR 71 | Resp 16 | Ht 67.0 in | Wt 154.8 lb

## 2022-12-22 DIAGNOSIS — J849 Interstitial pulmonary disease, unspecified: Secondary | ICD-10-CM

## 2022-12-22 DIAGNOSIS — I10 Essential (primary) hypertension: Secondary | ICD-10-CM

## 2022-12-22 NOTE — Progress Notes (Signed)
Primary Physician/Referring:  Ignatius Specking, MD  Patient ID: Alan Roman, male    DOB: Jun 23, 1946, 77 y.o.   MRN: [id number]  Chief Complaint  Patient presents with   Hypertension   Hyperlipidemia   Coronary calcification   Follow-up    1 year   HPI:    Alan Roman  is a 77 y.o. [place] male with history of hypertension, psoriatic arthritis and follows Dr. Pollyann Savoy, very mild hyperlipidemia and coronary calcificaiton by CT chest, IPF, hypertension.   Due to recent high-resolution CT scan done on 11/23/2020 revealing left main and two-vessel coronary calcification and aortic atherosclerosis, in view of underlying dyspnea, he underwent echocardiogram and stress test.  Echocardiogram revealed mild to moderate TR, normal LVEF and otherwise no significant abnormalities.  Stress test was overall low risk.    Dyspnea on exertion remains stable.  Denies palpitations, syncope, near syncope, dizziness, orthopnea, leg swelling.  Past Medical History:  Diagnosis Date   Acute bronchiolitis due to other infectious organisms 01/26/2013   Arthritis    "knees" (11/03/2015)   Extrapulmonary TB (tuberculosis) 02/02/2013   antibx therapy in 2014   GERD (gastroesophageal reflux disease)    on meds   High risk medication use 12/27/2016   History of hiatal hernia 2007; 2009   Hyperlipidemia    on meds   Hypertension    on meds   Pleural effusion 01/26/2013   Past Surgical History:  Procedure Laterality Date   BIOPSY  04/01/2021   Procedure: BIOPSY;  Surgeon: Meryl Dare, MD;  [facility];  Service: Endoscopy;;   CATARACT EXTRACTION W/ INTRAOCULAR LENS  IMPLANT, BILATERAL Bilateral    ESOPHAGOGASTRODUODENOSCOPY (EGD) WITH PROPOFOL N/A 04/01/2021   Procedure: ESOPHAGOGASTRODUODENOSCOPY (EGD) WITH PROPOFOL;  Surgeon: Meryl Dare, MD;  [facility];  Service: Endoscopy;  Laterality: N/A;   HIATAL HERNIA REPAIR  2007; 2009   SAVORY DILATION  N/A 04/01/2021   Procedure: SAVORY DILATION;  Surgeon: Meryl Dare, MD;  [facility];  Service: Endoscopy;  Laterality: N/A;   TOTAL KNEE ARTHROPLASTY Left 11/03/2015   TOTAL KNEE ARTHROPLASTY Left 11/03/2015   Procedure: Left Total Knee Arthroplasty;  Surgeon: Valeria Batman, MD;  [facility];  Service: Orthopedics;  Laterality: Left;   TOTAL KNEE ARTHROPLASTY Right 07/17/2018   Procedure: RIGHT TOTAL KNEE ARTHROPLASTY;  Surgeon: Valeria Batman, MD;  Location[facility] OR;  Service: Orthopedics;  Laterality: Right;   VIDEO BRONCHOSCOPY Bilateral 01/31/2013   Procedure: VIDEO BRONCHOSCOPY WITHOUT FLUORO;  Surgeon: Merwyn Katos, MD;  Location: [facility] ENDOSCOPY;  Service: Cardiopulmonary;  Laterality: Bilateral;   Social History   Tobacco Use   Smoking status: Never   Smokeless tobacco: Never  Substance Use Topics   Alcohol use: No    ROS  Review of Systems  Cardiovascular:  Positive for dyspnea on exertion (stable). Negative for chest pain and leg swelling.    Objective  Blood pressure 109/64, pulse 71, resp. rate 16, height 5\' 7"  (1.702 m), weight 154 lb 12.8 oz (70.2 kg), SpO2 97 %.     12/22/2022   11:27 AM 10/06/2022    3:00 PM 05/19/2022    8:40 AM  Vitals with BMI  Height 5\' 7"  5\' 7"  5\' 7"   Weight 154 lbs 13 oz 157 lbs 3 oz 157 lbs  BMI 24.24 24.62 24.58  Systolic 109 92 116  Diastolic 64 56 75  Pulse 71 87 67    Physical Exam Neck:  Vascular: No carotid bruit or JVD.  Cardiovascular:     Rate and Rhythm: Normal rate and regular rhythm.     Pulses: Intact distal pulses.     Heart sounds: Normal heart sounds. No murmur heard.    No gallop.  Pulmonary:     Effort: Pulmonary effort is normal.     Breath sounds: Rales (Leathery crackles at bilateral bases, left greater than the right.) present.  Abdominal:     General: Bowel sounds are normal.     Palpations: Abdomen is soft.  Musculoskeletal:     Right lower leg: No edema.     Left lower leg:  No edema.    Laboratory examination:   Lab Results  Component Value Date   NA 138 10/06/2022   K 4.1 10/06/2022   CO2 28 10/06/2022   GLUCOSE 107 (H) 10/06/2022   BUN 20 10/06/2022   CREATININE 1.27 10/06/2022   CALCIUM 9.0 10/06/2022   GFRNONAA >60 05/21/2020    Recent Labs    05/17/22 0902 10/06/22 1553  NA 136 138  K 4.3 4.1  CL 102 104  CO2 29 28  GLUCOSE 95 107*  BUN 15 20  CREATININE 1.24 1.27  CALCIUM 9.2 9.0   CrCl cannot be calculated (Patient's most recent lab result is older than the maximum 21 days allowed.).     Latest Ref Rng & Units 10/06/2022    3:53 PM 05/26/2022    9:40 AM 05/17/2022    9:02 AM  CMP  Glucose 70 - 99 mg/dL 161   95   BUN 6 - 23 mg/dL 20   15   Creatinine 0.96 - 1.50 mg/dL 0.45   4.09   Sodium 811 - 145 mEq/L 138   136   Potassium 3.5 - 5.1 mEq/L 4.1   4.3   Chloride 96 - 112 mEq/L 104   102   CO2 19 - 32 mEq/L 28   29   Calcium 8.4 - 10.5 mg/dL 9.0   9.2   Total Protein 6.0 - 8.3 g/dL 6.8  6.9  7.6   Total Bilirubin 0.2 - 1.2 mg/dL 0.4  0.6  0.6   Alkaline Phos 39 - 117 U/L 83  78  89   AST 0 - 37 U/L 36  35  44   ALT 0 - 53 U/L 26  24  27        Latest Ref Rng & Units 10/06/2022    3:53 PM 05/17/2022    9:02 AM 12/29/2020    2:02 PM  CBC  WBC 4.0 - 10.5 K/uL 5.9  5.5  5.6   Hemoglobin 13.0 - 17.0 g/dL 91.4  78.2  95.6   Hematocrit 39.0 - 52.0 % 40.5  39.9  38.9   Platelets 150.0 - 400.0 K/uL 209.0  156.0  156.0     External labs:  Cholesterol, total 148.000 m 05/18/2022 HDL 51.000 mg 05/18/2022 LDL 79.000 mg 05/18/2022 Triglycerides 97.000 mg 05/18/2022  TSH 2.080 05/18/2022   Allergies   Allergies  Allergen Reactions   Ofev [Nintedanib] Hypertension    Final Medications at End of Visit     Current Outpatient Medications:    aspirin (ASPIRIN CHILDRENS) 81 MG chewable tablet, Chew 1 tablet (81 mg total) by mouth daily., Disp: , Rfl:    losartan (COZAAR) 50 MG tablet, Take 1 tablet (50 mg total) by mouth  daily., Disp: 90 tablet, Rfl: 1   methotrexate 2.5 MG tablet, Take 3 tablets  by mouth once a week., Disp: , Rfl:    multivitamin-iron-minerals-folic acid (CENTRUM) chewable tablet, Chew 1 tablet by mouth daily., Disp: , Rfl:    omeprazole (PRILOSEC) 20 MG capsule, TAKE 1 CAPSULE(20 MG) BY MOUTH DAILY, Disp: 90 capsule, Rfl: 1   Pirfenidone 801 MG TABS, Take 1 tablet (801 mg total) by mouth with breakfast, with lunch, and with evening meal., Disp: 810 tablet, Rfl: 1   rosuvastatin (CRESTOR) 5 MG tablet, TAKE 1 TABLET(5 MG) BY MOUTH DAILY, Disp: 90 tablet, Rfl: 3   Triamcinolone Acetonide (TRIAMCINOLONE 0.1 % CREAM : EUCERIN) CREA, Apply 1 application topically 2 (two) times daily., Disp: 1 each, Rfl: 0   Radiology:   Barium swallow study 06/07/2019: Schatzki ring at the GE junction, which prevents passage of a 13 mm barium tablet. No evidence of hiatal hernia or gastroesophageal reflux.  High-resolution CT scan of the chest 11/23/2020:  1. The appearance of the lungs is compatible with interstitial lung disease, with a spectrum of findings once again categorized as probable usual interstitial pneumonia (UIP) per current ATS guidelines. Minimal progression of disease compared to the prior examination. 2. Aortic atherosclerosis, in addition to left main and 2 vessel coronary artery disease. Assessment for potential risk factor modification, dietary therapy or pharmacologic therapy may be warranted, if clinically indicated. 3. Cholelithiasis.  Cardiac Studies:   Lexiscan Tetrofosmin Stress Test  12/21/2020: Nondiagnostic ECG stress. Myocardial perfusion is normal. Overall LV systolic function is normal without regional wall motion abnormalities. Stress LVEF: 51%. No previous exam available for comparison.  PCV ECHOCARDIOGRAM COMPLETE 12/15/2022  Narrative Echocardiogram 12/15/2022: Normal LV systolic function with visual EF 55-60%. Left ventricle cavity is normal in size. Normal left  ventricular wall thickness. Normal global wall motion. Indeterminate diastolic filling pattern, normal LAP. Calculated EF 55%. Trileaflet aortic valve. Mild (Grade I) aortic regurgitation. Mild aortic valve leaflet thickening. Structurally normal tricuspid valve. Mild tricuspid regurgitation. No evidence of pulmonary hypertension. RVSP measures 32 mmHg. Structurally normal pulmonic valve. Mild pulmonic regurgitation. No significant change compared to 11/2020.    EKG   EKG 12/22/2022: Normal sinus rhythm at the rate of 63 bpm, incomplete right bundle branch block.  Normal EKG.  No significant change from 12/21/2021.  Assessment     ICD-10-CM   1. Essential hypertension  I10 EKG 12-Lead    2. ILD (interstitial lung disease) (HCC)  J84.9       No orders of the defined types were placed in this encounter.   There are no discontinued medications.  Orders Placed This Encounter  Procedures   EKG 12-Lead     Recommendations:   Alan Roman  is a 77 y.o. Asian [place] male with history of hypertension, psoriatic arthritis and follows Dr. Pollyann Savoy, very mild hyperlipidemia and coronary calcificaiton by CT chest, IPF, hypertension.   1. Essential hypertension Patient's blood pressure is well-controlled, no change in his EKG.  He is on appropriate medical therapy.  I reviewed his labs, renal function is normal. - EKG 12-Lead  2. ILD (interstitial lung disease) (HCC) With regard to dyspnea on exertion and DLD, no change in his physical exam.  I reviewed the results of the echocardiogram, fortunately no evidence of pulm hypertension.  No RV strain.  Lipids are well-controlled.  Overall patient is presently doing well, no changes in the medications were done today.  Will see him back on annual basis.   Alan Decamp, MD, [facility] 12/22/2022, 12:05 PM Office: [phone] Fax: [phone] Pager: [phone]

## 2023-01-22 ENCOUNTER — Other Ambulatory Visit: Payer: Self-pay | Admitting: Cardiology

## 2023-01-22 DIAGNOSIS — E785 Hyperlipidemia, unspecified: Secondary | ICD-10-CM

## 2023-01-22 DIAGNOSIS — I2510 Atherosclerotic heart disease of native coronary artery without angina pectoris: Secondary | ICD-10-CM

## 2023-02-03 ENCOUNTER — Ambulatory Visit (INDEPENDENT_AMBULATORY_CARE_PROVIDER_SITE_OTHER): Payer: Medicare Other | Admitting: Internal Medicine

## 2023-02-03 ENCOUNTER — Encounter: Payer: Self-pay | Admitting: Internal Medicine

## 2023-02-03 ENCOUNTER — Ambulatory Visit: Payer: Medicare Other | Admitting: Internal Medicine

## 2023-02-03 VITALS — BP 92/60 | HR 78 | Temp 97.9°F | Ht 67.5 in | Wt 155.0 lb

## 2023-02-03 DIAGNOSIS — Z7189 Other specified counseling: Secondary | ICD-10-CM

## 2023-02-03 DIAGNOSIS — J84112 Idiopathic pulmonary fibrosis: Secondary | ICD-10-CM

## 2023-02-03 DIAGNOSIS — Z7184 Encounter for health counseling related to travel: Secondary | ICD-10-CM

## 2023-02-03 LAB — PULMONARY FUNCTION TEST
DL/VA % pred: 83 %
DL/VA: 3.68 ml/min/mmHg/L
DLCO cor % pred: 41 %
DLCO cor: 11.74 ml/min/mmHg
DLCO unc % pred: 41 %
DLCO unc: 11.74 ml/min/mmHg
FEF 25-75 Post: 3.5 L/sec
FEF 25-75 Pre: 3.06 L/sec
FEF2575-%Change-Post: 14 %
FEF2575-%Pred-Post: 188 %
FEF2575-%Pred-Pre: 165 %
FEV1-%Change-Post: 1 %
FEV1-%Pred-Post: 71 %
FEV1-%Pred-Pre: 70 %
FEV1-Post: 1.78 L
FEV1-Pre: 1.75 L
FEV1FVC-%Change-Post: 6 %
FEV1FVC-%Pred-Pre: 113 %
FEV6-%Change-Post: -4 %
FEV6-%Pred-Post: 57 %
FEV6-%Pred-Pre: 59 %
FEV6-Post: 1.93 L
FEV6-Pre: 2.01 L
FEV6FVC-%Change-Post: 0 %
FEV6FVC-%Pred-Post: 97 %
FEV6FVC-%Pred-Pre: 97 %
FVC-%Change-Post: -4 %
FVC-%Pred-Post: 58 %
FVC-%Pred-Pre: 61 %
FVC-Post: 1.93 L
FVC-Pre: 2.01 L
Post FEV1/FVC ratio: 92 %
Post FEV6/FVC ratio: 100 %
Pre FEV1/FVC ratio: 87 %
Pre FEV6/FVC Ratio: 100 %
RV % pred: 62 %
RV: 1.52 L
TLC % pred: 59 %
TLC: 3.81 L

## 2023-02-03 NOTE — Progress Notes (Signed)
NOV 2020 with Dr Vassie Loll  77 year old never smoker of [place] origin initial consult 05/2019  for evaluation of hoarseness, dry cough and congestion. He is originally from [place], [place], [place]-immigrated to the [place] in 1971 for his bachelors to [place] and then settled in [place] in 1978 and got into [employer] business.  PMH - - psoriatic arthritis by Dr. Orene Desanctis, was originally on Humira and is now on Cosentyx. - latent TB that was untreated initially and in 12/2012 he presented with a mild alveolitis on CT scan and pleural effusion that required thoracentesis, bronchoscopy was negative for AFB but he was treated under the guidance of local health department and ID with multiple drug antituberculous treatment for 3 months.  His problem lists extrapulmonary TB but on my review all AFB cultures were negative.   -07/2018 chest x-ray shows eventration of right hemidiaphragm.   - vocal cord paralysis in 04/2015 and underwent ENT evaluation for persistent dysphonia and underwent gel injection of the vocal cords at [facility] without any relief  Chief Complaint  Patient presents with   Follow-up    1 month rov- review CT chest 10/31.  pt states his s/s are unchanged since last month, prod cough with clear mucus qam that subsides throughout the day.      Symptoms are persistent. Main issues are hoarseness, dry cough and congestion  We reviewed HRCT in detail today  CT angiogram 10/2015 -which shows mild reticular densities suggestive but not diagnostic of ILD.  HRCT 05/31/19 mild, bibasilar predominant pulmonary fibrosis in a pattern featuring subtle subpleural reticular interstitial opacity, mild traction bronchiectasis and occasional areas of Bronchiolectasis - ' probable UIP ' - slight worse   OV [name] Jan 2021  77 year old male never smoker presenting to our office today after completing pulmonary function testing.  Patient was last seen in our office in October/2020  for initial consult with Dr. Vassie Loll.  At that office visit patient was scheduled for pulmonary function testing, lab work was completed to further evaluate patient's probable UIP on October/2020 high-resolution CT chest, patient was referred to ear nose and throat, swallow studies were also ordered on the patient.  Patient completed ENT referral in 07/02/2019 with Dr. Merril Abbe Recommendations for voice therapy, that he follow-up with gastroenterology regarding this Schatzki's ring.  Follow-up in April.  Proceed forward with pulmonary pulmonary function test.  Patient reports that he is received a telephone call to restart voice rehab but he has not done this yet as he wanted to review pulmonary function testing with our office first.  Patient swallow studies are listed below:  06/06/2019-swallow study-mild aspiration risk, no treatment recommended at this time 06/07/2019-barium swallow-Schatzki ring at the GE junction which prevents passage of a 13 mm barium tablet  Patient has a gastroenterologist Dr. Loreta Ave but he has not seen her in over a year.  Patient has no scheduled upcoming appointments.  Patient continues to follow-up with rheumatology, Dr. Corliss Skains.  Per chart review last office note was in July/2020.  Patient presenting today to review December/2020 pulmonary function test.  Those results are listed below:  07/19/2019-pulmonary function test-FVC 2.32 (63% predicted), postbronchodilator ratio 93, postbronchodilator FEV1 1.93 (69% predicted), DLCO 13.59 (43% predicted) >>>Severe restriction, moderately severe diffusion defect  Lab work from November/2020 are also listed below:  06/03/2019-ANA-1: 40, nuclear homogenous 06/03/2019-rheumatoid factor-14 06/03/2019-anti-Jo 1-less than 0.2 06/03/2019-aldolase-4.9 06/03/2019-centromere antibodies-negative 06/03/2019-antiscleroderma antibody-less than 1 - 06/03/2019-Sjogren's syndrome 8 and B-negative 06/03/2019-hypersensitivity  pneumonitis-negative 06/03/2019-sed rate  22  Mildly elevated ANA and rheumatoid factor.  Felt to be not clinically significant.  Rest of results are negative.  Patient continues to report that he is asymptomatic.  Patient tolerates walk in office without any oxygen desaturations.  He does continue to have occasional bouts of congestion which is believed to be related to the Cosentyx.  Family has messaged rheumatology regarding this.  Tests:   07/19/2019-pulmonary function test-FVC 2.32 (63% predicted), postbronchodilator ratio 93, postbronchodilator FEV1 1.93 (69% predicted), DLCO 13.59 (43% predicted) >>>Severe restriction, moderately severe diffusion defect  07/16/2019-SARS-CoV-2-not detected  06/03/2019-ANA-1: 40, nuclear homogenous 06/03/2019-rheumatoid factor-14 06/03/2019-anti-Jo 1-less than 0.2 06/03/2019-aldolase-4.9 06/03/2019-centromere antibodies-negative 06/03/2019-antiscleroderma antibody-less than 1 - 06/03/2019-Sjogren's syndrome 8 and B-negative 06/03/2019-hypersensitivity pneumonitis-negative 06/03/2019-sed rate 22  06/01/2019-CT chest high-res mild, bibasilar predominant pulmonary fibrosis in a pattern with featuring subtle subpleural reticular interstitial opacity and mild traction bronchiectasis occasional areas of bronchiolectasis, probable UIP  06/06/2019-swallow study-mild aspiration risk, no treatment recommended at this time  06/07/2019-barium swallow-Schatzki ring at the GE junction which prevents passage of a 13 mm barium tablet  FENO:  No results found for: NITRICOXIDE  MH: Psoriatic arthritis (Dr. Corliss Skains - prev on Humira, now on Cosentyx), latent TB that was untreated initially in 12/2012 with mild alveolitis on CT scan and pleural effusion that required thoracentesis, bronchoscopy was negative for AFB but he was treated under guidance with local health department infectious disease with multidrug antituberculosis treatment for 3 months, eventration of right  hemidiaphragm, vocal cord paralysis in September/2016 underwent ENT evaluation for persistent dysphonia and underwent gel injection of vocal cords at [facility] without any relief Smoker/ Smoking History: Never smoker Maintenance: None   He is originally from [place], [place]-immigrated to the [place] in 1971 for his bachelors to [place] and then settled in [place] in 1978 and got into [employer] business.    OV 11/10/2020  Subjective:  Patient ID: Alan Roman, male , DOB: Oct 18, 1945 , age 26 y.o. , MRN: [id number] , ADDRESS: [address] [place] [address] PCP Ignatius Specking, MD Patient Care Team: Ignatius Specking, MD as PCP - General (Internal Medicine)  This Provider for this visit: Treatment Team:  Attending Provider: Kalman Shan, MD    11/10/2020 -   Chief Complaint  Patient presents with   Follow-up    Increased coughing for past 3 months, sometimes productive with whitish sputum. Denies shortness of breath.      HPI [facility] 77 y.o. -asked by Dr. Jacinto Halim to establish at the pulmonary fibrosis center.  Therefore patient is seeing Dr. Marchelle Gearing.  Patient used to be previously followed by Dr. Vassie Loll.  Last seen by Dr. Vassie Loll in 2020.  Then seen by nurse practitioner [name] in January 2021.   His daughtre [name] later joined on the phone call.  Patient is known to have ILD probable UIP pattern.  Is on observation at this point.   [place] Integrated Comprehensive ILD Questionnaire  Symptoms:  -   he reports insidious onset of dyspnea for the last 4 months.  Since it started it is the same.  Although his ILD is present even before that.  He says that he has a very mild cough however in the last 4 to 6 months it is worse.  Particularly when he talks for a long time.  He does not wake up in the middle of the night because of the cough he does bring up some white sputum that is very light white.  Cough does  not affect his voice.  He does clear his  throat.  He has never had hemoptysis.  He does not cough when he lies down.  No nausea vomiting or diarrhea.  No wheezing.  Cough does get worse with eating  Past Medical History : He has psoriatic arthritis.  He is immunosuppressed and is on immunomodulators.  Currently on Simponi.  He does not have rheumatoid arthritis.  No heart failure.  No other collagen vascular disease of connective tissue disease.  No COPD.  No asthma.  No HIV.  No hiatal hernia.  No pulmonary hypertension.  No diabetes.  No thyroid disease.  No stroke no seizures.  No tuberculosis no kidney disease no pneumonia.  No blood clots no heart disease no pleurisy.   He is deemed to be an aspiration risk he has vocal cord paralysis.  He is to see Dr. Loreta Ave GI.  He wants to switch to [facility] but because of the pandemic has not been able to establish.  ROS: He does not have any fatigue.  No arthralgia.  No  dysphagia.  No Raynaud's.  No dry eyes no fever no weight loss.  No nausea no vomiting.  No heartburn no snoring no rash no ulcers.   FAMILY HISTORY of LUNG DISEASE: No pulmonary fibrosis.  No COPD.  No asthma no sarcoid no cystic fibrosis no hypersensitive pneumonitis no autoimmune disease in the family.    EXPOSURE HISTORY: No tobacco.  No smoking cigars no smoking pipes.  No electronic cigarette use.  No vaping marijuana.  No cocaine use.  No intravenous drug use.   HOME and HOBBY DETAILS : He lives in a 78 year old family home.  He is lived there for 40 years.  No damp environment.  No mold or mildew.  No humidifier use no CPAP use no nebulizer use.  No steam iron use.  No Jacuzzi use.  No misting Fountain.  No pet birds or parakeets.  No feather pillows or blankets.  No mold in the [facility] duct.  No music habits no gardening habits.  Never exposed to birds or chickens.  No water damage in the basement.  No straw meds.  No hot tub use.  No animals at work.    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  Organic antigen exposure history is negative.  Inorganic antigen exposure history is negative.   PULMONARY TOXICITY HISTORY (27 items):  Simponi for psoriatic arthritis      .OV 12/18/2020  Subjective:  Patient ID: Alan Roman, male , DOB: [birth year] , age 70 y.o. , MRN: [id number] , ADDRESS: [address] [place] [address] PCP Ignatius Specking, MD Patient Care Team: Ignatius Specking, MD as PCP - General (Internal Medicine) Yates Decamp, MD as PCP - Cardiology (Cardiology)  This Provider for this visit: Treatment Team:  Attending Provider: Kalman Shan, MD    12/18/2020 -   Chief Complaint  Patient presents with   Follow-up    PFT performed today, HRCT performed 4/25, and EGD scheduled 5/23.  Pt states he has been okay since last visit. States his only concern is hoarseness with voice and an occ cough.     HPI [facility] 77 y.o. -returns for follow-up to discuss his test results.  He continues to have hoarse voice.  He denies any dysphagia.  He is now scheduled for endoscopy 12/21/2020.  He is going to see cardiology ahead of  that.  His serologies negative.  His symptoms continue to be minimal and stable.  He had high-resolution CT scan of the chest that shows progression compared to October 2020.  It is described as probable UIP.  Back in October 2020 there was craniocaudal gradient described and no air trapping.  This time April 2022 radiologist feels there is slight upper lobe predominance and there is air trapping.  This latter description raises the question of possible chronic hypersensitive pneumonitis even though the final conclusion was probable UIP.  I went over exposure history with him again.  No organic antigen exposure history identified.  Nevertheless disease appears progressive.    CT chest HIgh res 11/23/20   IMPRESSION: CLINICAL DATA:  77 year old male with history of bronchiectasis. Follow-up study.   EXAM: CT  CHEST WITHOUT CONTRAST   TECHNIQUE: Multidetector CT imaging of the chest was performed following the standard protocol without intravenous contrast. High resolution imaging of the lungs, as well as inspiratory and expiratory imaging, was performed.   COMPARISON:  Chest CT 05/31/2019.   FINDINGS: Cardiovascular: Heart size is normal. There is no significant pericardial fluid, thickening or pericardial calcification. There is aortic atherosclerosis, as well as atherosclerosis of the great vessels of the mediastinum and the coronary arteries, including calcified atherosclerotic plaque in the left main, left anterior descending and left circumflex coronary arteries.   Mediastinum/Nodes: No pathologically enlarged mediastinal or hilar lymph nodes. Please note that accurate exclusion of hilar adenopathy is limited on noncontrast CT scans. Esophagus is unremarkable in appearance. No axillary lymphadenopathy.   Lungs/Pleura: High-resolution images demonstrate widespread patchy regions of ground-glass attenuation, septal thickening, mild cylindrical bronchiectasis and peripheral bronchiolectasis. Findings have no discernible craniocaudal gradient, and there is significant involvement in the anterior aspects of the upper lobes of the lungs bilaterally. No frank honeycombing. Inspiratory and expiratory imaging demonstrates some mild air trapping indicative of mild small airways disease. Minimal progression compared to the prior examination. No acute consolidative airspace disease. No pleural effusions. No suspicious appearing pulmonary nodules or masses are noted.   Upper Abdomen: Aortic atherosclerosis. 5 mm calcified gallstone in the neck of the gallbladder. Small calcified granuloma in the spleen incidentally noted.   Musculoskeletal: There are no aggressive appearing lytic or blastic lesions noted in the visualized portions of the skeleton 1. The appearance of the lungs is  compatible with interstitial lung disease, with a spectrum of findings once again categorized as probable usual interstitial pneumonia (UIP) per current ATS guidelines. Minimal progression of disease compared to the prior examination. 2. Aortic atherosclerosis, in addition to left main and 2 vessel coronary artery disease. Assessment for potential risk factor modification, dietary therapy or pharmacologic therapy may be warranted, if clinically indicated. 3. Cholelithiasis.   Aortic Atherosclerosis (ICD10-I70.0).     Electronically Signed   By: Trudie Reed M.D.   On: 11/24/2020 08:04   xxxx Results for Alan Roman, Alan "JAGDISH" (MRN [id number]) as of 12/18/2020 16:47  Ref. Range 06/03/2019 10:42  Aspergillus Fumigatus Latest Ref Range: NEGATIVE  NEGATIVE  Pigeon Serum Latest Ref Range: NEGATIVE  NEGATIVE  Anti Nuclear Antibody (ANA) Latest Ref Range: NEGATIVE  POSITIVE (A)  ANA Pattern 1 Unknown Nuclear, Homogeneous (A)  ANA Titer 1 Latest Units: titer 1:40 (H)  Anti JO-1 Latest Ref Range: 0.0 - 0.9 AI <0.2  CENTROMERE AB SCREEN Latest Ref Range: <1.0 NEG AI <1.0 NEG  RA Latex Turbid. Latest Ref Range: <14 IU/mL 14 (H)  SSA (Ro) (ENA) Antibody, IgG Latest Ref Range: <  1.0 NEG AI <1.0 NEG  SSB (La) (ENA) Antibody, IgG Latest Ref Range: <1.0 NEG AI <1.0 NEG  Scleroderma (Scl-70) (ENA) Antibody, IgG Latest Ref Range: <1.0 NEG AI <1.0 NEG    OV 01/21/2021  Subjective:  Patient ID: Alan Roman, male , DOB: 1946/03/15 , age 89 y.o. , MRN: [id number] , ADDRESS: [address] [place] [address] PCP Ignatius Specking, MD Patient Care Team: Ignatius Specking, MD as PCP - General (Internal Medicine) Yates Decamp, MD as PCP - Cardiology (Cardiology)  This Provider for this visit: Treatment Team:  Attending Provider: Kalman Shan, MD    01/21/2021 -   Chief Complaint  Patient presents with   Follow-up    Pt states he has been doing okay since last visit.  States that he has not started on the OFEV medication yet.     HPI [facility] 77 y.o. -presents for follow-up in this visit he was supposed to have started nintedanib by now.  However he is yet to start this.  There are  supply chain issues.  But he says it is approved.  Overall no change in symptoms.  Unclear if he is finished his GI work-up.  He continues to have some hoarse voice.  He has not seen ENT.  He is accepted my recommendation to have a referral.  He did asked me whether a few episode of chlorine exposure acutely 40 years ago could have caused his pulmonary fibrosis.  I told him while it was possible it is unlikely.  Unclear about single episode although possible as be seen with COVID   02/18/2021 Patient contacted today for 4 week follow-up. He started OFEV anti-fibrotic on June 30th. He developed headache and diarrhea after 2 days of starting medication. He stopped taking OFEV 4 days ago d/t GI symptoms and fatigue. He also noted that his blood pressure was elevated. His symptoms have resolved since stopping medication.    MDD confderence Sept 2022  Dr Ashley Murrain - November 23, 2020 - HRCT - compared to CTA 2017 and HRCT 2020. Recent CT  moderage patchy retic, and some GGO. TB is mild . Findings are distriubted evenly - no CCG.  No frank HC. clearly tehre is fibrotic ILD. Air trapping is ver minimal and in lower lobes and is physiologic and not clinicall signifcant. No AT in upper lobe or middle lobe/lingulare   Final DX: Prob UIP and ther is mild progression since 2020 and defintiey since 2017.   Clinical dx:  IPF and Rx as such   OV 04/20/2021  Subjective:  Patient ID: Alan Roman, male , DOB: [birth year] , age 66 y.o. , MRN: [id number] , ADDRESS: [address] [place] [address] PCP Ignatius Specking, MD Patient Care Team: Ignatius Specking, MD as PCP - General (Internal Medicine) Yates Decamp, MD as PCP - Cardiology (Cardiology)  This Provider for this visit:  Treatment Team:  Attending Provider: Kalman Shan, MD    04/20/2021 -   Chief Complaint  Patient presents with   Follow-up    Pt states his BP is still increasing even after going to 1 pill of the OFEV a day.   IPF: age greater than 65, Velcro crackles at the base, previous description of probable UIP and nearly negative serology [psoriatic arthritis does not count is connective tissue disease ILD], vocal cord paralysis with some aspiration risk that this is IPF.   - Dx given June 2022 - Confirmd MDD conferne Sept 2022   -  Ofev June 2022  HPI [facility] 77 y.o. -presents for follow-up.  I am meeting his wife for the first time.  He feels he is stable.  He is walking 30 minutes in the mornings and he can do this without stopping.  He says his dyspnea is not worse and his cough is not worse but objectively on the subjective symptom questionnaire there might be progression.  We did discuss his case in the ILD conference.  The diagnosis is IPF.  There is progression.  In terms of therapeutics we started him on nintedanib at 150 mg twice daily but he called saying that he was intolerant to it because of hypertension [anecdotally he is my first patient with hypertension in 6 years with nintedanib] but also diarrhea and fatigue.  He then stopped it and apparently his blood pressure normalized.  Then 04/10/2021 he started himself on nintedanib 100 mg strength.  He is currently doing only 1 time daily.  He feels his blood pressure is going up again to 140 systolic.  [At 150 twice a day it was at 150 systolic] and this is no normal.  He is not having any other symptoms.  We discussed the option of switching to pirfenidone and going through approval process again.  We discussed the side effects of skin issues, fatigue, nausea, anorexia and ongoing liver monitoring with significant overlap with nintedanib side effects.  At this point in time he is resolved to increase his nintedanib to 100 mg  twice daily and if he is tolerating well and that go to 150 mg twice daily.  If this fails then he will go to pirfenidone.  End of November 2022 he is going to go to [place] till February 2022.  He will have a high-dose flu shot today.         OV 06/07/2021  Subjective:  Patient ID: Alan Roman, male , DOB: 1945/10/13 , age 89 y.o. , MRN: [id number] , ADDRESS: [address] [place] [address] PCP Ignatius Specking, MD Patient Care Team: Ignatius Specking, MD as PCP - General (Internal Medicine) Yates Decamp, MD as PCP - Cardiology (Cardiology)  This Provider for this visit: Treatment Team:  Attending Provider: Kalman Shan, MD    06/07/2021 -   Chief Complaint  Patient presents with   Follow-up    4wk f/u after starting Ofev. Stopped the Ofev about 3 weeks ago due to increased blood pressure. Plans to restart it next Monday. Denies any increased nausea, diarrhea or loss of appetite.     HPI [facility] 77 y.o. -returns for his IPF follow-up.  This particular visit is to focus on how he is tolerating his nintedanib.  He is extremely concerned his nintedanib is causing hypertension.  When he was taking 100 mg which is the lower dose at 1 pill/day he did not have hypertension.  He then increase it to 100 mg twice daily [this is the lowest therapeutic dose].  He says this caused his blood pressure systolic to go up to 140s.  He has since stopped taking all nintedanib.  His blood pressure is now normal.  He does not want take nintedanib anymore.  We discussed the alternative of taking pirfenidone.  He is open to this idea.  Pirfenidone is not known to cause hypertension.  In fact even at nintedanib I am not seen any patient developed hypertension except him.  We discussed the other side effects of pirfenidone including GI issues, weight loss, nausea, liver  function test monitoring and necessity for sunscreen.  He is willing to do all this.  He has upcoming travel to  [place] towards the end of November 2022 through February 2023.  We explained that not taking the antifibrotic but somewhat less than 5% chance of progression over 3 months.  He does not want to take this risk because of prior progression.  He just wants to take the medication pirfenidone.  So we will make a request for this.  I did advise him to register himself with a pulmonologist and his home city in [place].  I also spoke to his daughter over the phone.  They will arrange this.  He is willing to do video and telephone visit with me when he is in [place].  Of note he developed back pain with right-sided sciatica 3 weeks ago.  He has right-sided sciatica.  He had an MRI that shows DJD findings.  He will follow-up with Dr. Cleophas Dunker orthopedics for this.  He and his wife and daughter Huel Cote on the phone] feel that he may not make it [place] because of the back pain.  I advised her to talk about this with Dr. Cleophas Dunker.  Of note he has upcoming pulmonary function test appointment in 1 week.  He also has office visit.  We discussed this with him.  He will reschedule his office visit as a televisit to discuss his pirfenidone uptake and his pulmonary function test combined.  He will do this from [place].      OV 06/15/2021  Subjective:  Patient ID: Alan Roman, male , DOB: 1945-12-21 , age 45 y.o. , MRN: [id number] , ADDRESS: [address] [place] [address] PCP Ignatius Specking, MD Patient Care Team: Ignatius Specking, MD as PCP - General (Internal Medicine) Yates Decamp, MD as PCP - Cardiology (Cardiology)  This Provider for this visit: Treatment Team:  Attending Provider: Kalman Shan, MD    06/15/2021 -   Chief Complaint  Patient presents with   Follow-up    Pft done today. No new concerns     HPI [facility] 77 y.o. -returns for follow-up.  His wife is here with him.  His symptom score shows minimal symptoms and is actually better.  His pulmonary function test shows  slight worsening compared to 2 years ago.  But he is not feeling this.  He is off his nintedanib and is feeling better.  He is decided to start pirfenidone.  I did indicate to him that his progression.  He is here to start.  Pirfenidone because although approved the shipment is pending.  I sent a message to the pharmacist to see when his shipment would arrive.  His weight is stable.  He is doing some physical therapy for his back.  He is going to see Dr. Cleophas Dunker tomorrow.  He will register with a  pulmonologist in [place].  While in [place] once his [place] States pirfenidone runs he will get local pirfenidone.  He will get this through his pulmonologist there.       OV 05/17/2022  Subjective:  Patient ID: Alan Roman, male , DOB: [birth year] , age 24 y.o. , MRN: [id number] , ADDRESS: [address] [place] [address] PCP Ignatius Specking, MD Patient Care Team: Ignatius Specking, MD as PCP - General (Internal Medicine) Yates Decamp, MD as PCP - Cardiology (Cardiology)  This Provider for this visit: Treatment Team:  Attending Provider: Kalman Shan, MD  05/17/2022 -   Chief Complaint  Patient presents with   Follow-up    Doing well today. No sx noted     HPI Octavia [facility] 77 y.o. -returns for follow-up.  Last seen almost a year ago.  After that he went to [place] and came back in February 2023.  Then after coming back from [place] he was in [place] with his son for several months and then spending a lot of time in [place] with his grandson.  Because of this he did not make follow-up visits with [place].  While in [place] he was seen by a physician who he knows personally.  During this entire time it appears there was no liver function test monitoring.  I asked him why he missed his appointments with [place].  He states that after he came back from [place] he has been spending time in different places.  I did indicate to him that close follow-up is needed.  In any event symptoms are stable  he is tolerating pirfenidone really well.  There is only a 2 pound difference since the last year.  He is going to go back to [place] again later this month and return in February 2024.  His close friend who is also my patient with IPF recently had COVID-related IPF flareup.  He did mention this to me.  Of note he is status flu shot and also COVID booster according to his history.  He plans to have RSV vaccine before his travel to [place].     OV 10/06/2022  Subjective:  Patient ID: Alan Roman, male , DOB: [birth year] , age 89 y.o. , MRN: [id number] , ADDRESS: [address] [place] [address] PCP Ignatius Specking, MD Patient Care Team: Ignatius Specking, MD as PCP - General (Internal Medicine) Yates Decamp, MD as PCP - Cardiology (Cardiology)  This Provider for this visit: Treatment Team:  Attending Provider: Kalman Shan, MD  IPF: age greater than 65, Velcro crackles at the base, previous description of probable UIP and nearly negative serology [psoriatic arthritis does not count is connective tissue disease ILD], vocal cord paralysis with some aspiration risk that this is IPF.   - Dx given June 2022 - Confirmd MDD conferne Sept 2022   - Ofev June 2022 - stopped Oct/Nov 2022 due to hypertension  -Pirfenidone start November 2022   10/06/2022 -   Chief Complaint  Patient presents with   Follow-up    Pft review, no c/o      HPI [facility] 77 y.o. -returns for follow-up.  Last seen towards the end of last year.  After that he went for an extended trip to [place] to his native state.  He says he did really well in [place].  The only issue was that 1 time he had inguinal pain on the left lower quadrant was seen by urologist and diagnosed with bilateral inguinal hernia.  He is followed up with his primary care physician has been reassured.  Otherwise no medical problems no changes in medications no ER visits no urgent care visits.  He is tolerating pirfenidone at this full dose  quite well.  Shortness of breath has not changed.  In fact his walking desaturation test today stable.  Symptom scores are stable.  He also had pulmonary function test that shows continued stability in FVC and DLCO since starting pirfenidone.  He has not lost any weight.  He states he is up-to-date with his respiratory vaccines.  He has an upcoming repeat trip to [place] for 5 weeks  because he is building a house.  We briefly discussed clinical trials as a care option and he is interested        OV 02/03/2023  Subjective:  Patient ID: Alan Roman, male , DOB: 1945/11/04 , age 71 y.o. , MRN: [id number] , ADDRESS: [address] [place] [address] PCP Ignatius Specking, MD Patient Care Team: Ignatius Specking, MD as PCP - General (Internal Medicine) Yates Decamp, MD as PCP - Cardiology (Cardiology)  This Provider for this visit: Treatment Team:  Attending Provider: Kalman Shan, MD   IPF: age greater than 65, Velcro crackles at the base, previous description of probable UIP and nearly negative serology [psoriatic arthritis does not count is connective tissue disease ILD], vocal cord paralysis with some aspiration risk that this is IPF.   - Dx given June 2022 - Confirmd MDD conferne Sept 2022   - Ofev June 2022 - stopped Oct/Nov 2022 due to hypertension  -  -Pirfenidone start November 2022  On Methotrexate for psoriatc arthritis  Esbriet/Pirfenidone requires intensive drug monitoring due to high concerns for Adverse effects of , including  Drug Induced Liver Injury, significant GI side effects that include but not limited to Diarrhea, Nausea, Vomiting,  and other system side effects that include Fatigue, headaches, weight loss and other side effects such as skin rash. These will be monitored with  blood work such as LFT initially once a month for 6 months and then quarterly    02/03/2023 -   Chief Complaint  Patient presents with   Follow-up    Discuss pft       HPI Gerell Machamer 77 y.o. -returns for follow-up.  Presents with his wife.  Wife is an independent historian.  Both attest that he is doing stable and well.  He is tolerating pirfenidone well.  He is supposed to have liver function test today for high risk prescription therapeutic monitoring while on pirfenidone [he is also on methotrexate].  However he states he had his liver function test with the dermatologist.  I do not have the results with me.  But he states these were normal.  He will relay those results to me.  Therefore he does not want blood testing today.  We reviewed his pulmonary function test.  I personally visualized it.  In my personal independent interpretation there is a slight reduction in FVC and DLCO over time.  I shared this with him and his wife.  However symptom wise he is stable.  On exercise hypoxemia test he does show a tendency to desaturate but he is asymptomatic.  He is going to [place] in mid September 2024 through April 2025 because he is building a house there.  He wants to get a visit done right before that.  So we will see him back in 9 weeks with a breathing test.  He is interested in clinical trials as a care option but I did indicate to him that travel will be a barrier for his participation.  Wife wanted to know about any exercises.  I did show him POWERbreathe inspiratory muscle training.  He is using another device that I recommended in the past.  Is more like a flutter valve.  They will buy the POWERbreathe.     SYMPTOM SCALE - ILD 11/10/2020 and 12/18/2020   01/21/2021  04/20/2021 ofev 06/15/2021 158# - off ofev. Going to start esbriet 05/17/2022 156#ra 10/06/2022 157# 02/03/2023   O2 use ra ra ra ra ra ra ra  Shortness of Breath 0 -> 5 scale with 5 being worst (score 6 If unable to do)        At rest 0 0 0 0 0 0 0  Simple tasks - showers, clothes change, eating, shaving 0 0 0 0 0 0 0  Household (dishes, doing bed, laundry) 0 1 2 0 0 0 1  Shopping  0 0 0 0 0 0 0  Walking level at own pace 1 0 1 0 0 0 0  Walking up Stairs 1 1 1 1 1 2 1   Total (30-36) Dyspnea Score 2 2 4 1 1 2 2   How bad is your cough? 1 1 2.5 0 1 1 0  How bad is your fatigue x 0 0 0 0 0 0  How bad is nausea 0 0 0 0 o 0 0  How bad is vomiting?  0 00 0 0 0 000 0  How bad is diarrhea? 0  0 0 0 0 0  How bad is anxiety? x 0 0 0 0 0 0  How bad is depression x 0 0 0 0 00 0    Simple office walk 185 feet x  3 laps goal with forehead probe 11/10/2020  06/07/2021  05/17/2022 With 10/06/2022  02/03/2023   O2 used ra ra Walk test ra ra Sit stand  Number laps completed 3 3 3 3 15   Comments about pace avg  Mo dpace brisk Very briks  Resting Pulse Ox/HR 100% and 66/min 99% and HR 79 100% and= HR 56 100% and HR 87 99% and HR 70  Final Pulse Ox/HR 100% and 103/min 97% and HR 102 100% and HR 105 100% and HR 104 95% and HR 102  Desaturated </= 88% no no no    Desaturated <= 3% points no no no  Droppd 4 points  Got Tachycardic >/= 90/min yes yes yes    Symptoms at end of test none Mild dyspnea No complaints none No dyspnea  Miscellaneous comments x stabe stable stabte       PFT     Latest Ref Rng & Units 02/03/2023   10:43 AM 09/28/2022   11:08 AM 06/15/2021   12:51 PM 12/18/2020    2:23 PM 07/19/2019    1:38 PM  PFT Results  FVC-Pre L 2.01  P 2.11  2.07  1.98  2.32   FVC-Predicted Pre % 61  P 63  62  59  63   FVC-Post L 1.93  P   2.01  2.06   FVC-Predicted Post % 58  P   60  56   Pre FEV1/FVC % % 87  P 85  83  89  83   Post FEV1/FCV % % 92  P   92  93   FEV1-Pre L 1.75  P 1.80  1.72  1.76  1.94   FEV1-Predicted Pre % 70  P 71  67  69  69   FEV1-Post L 1.78  P   1.84  1.93   DLCO uncorrected ml/min/mmHg 11.74  P 12.95  12.64  14.24  13.59   DLCO UNC% % 41  P 45  44  50  43   DLCO corrected ml/min/mmHg 11.74  P 12.95  12.64  14.24    DLCO COR %Predicted % 41  P 45  44  50    DLVA Predicted % 83  P 90  75  104  90   TLC L 3.81  P   3.60  3.80   TLC % Predicted % 59  P    55  55   RV % Predicted % 62  P   34  60     P Preliminary result       has a past medical history of Acute bronchiolitis due to other infectious organisms (01/26/2013), Arthritis, Extrapulmonary TB (tuberculosis) (02/02/2013), GERD (gastroesophageal reflux disease), High risk medication use (12/27/2016), History of hiatal hernia (2007; 2009), Hyperlipidemia, Hypertension, and Pleural effusion (01/26/2013).   reports that he has never smoked. He has never used smokeless tobacco.  Past Surgical History:  Procedure Laterality Date   BIOPSY  04/01/2021   Procedure: BIOPSY;  Surgeon: Meryl Dare, MD;  Location: [facility] END[facility]COPY;  Service: Endoscopy;;   CATARACT EXTRACTION W/ INTRAOCULAR LENS  IMPLANT, BILATERAL Bilateral    ESOPHAGOGASTRODUODENOSCOPY (EGD) WITH PROPOFOL N/A 04/01/2021   Procedure: ESOPHAGOGASTRODUODENOSCOPY (EGD) WITH PROPOFOL;  Surgeon: Meryl Dare, MD;  Location: [facility];  Service: Endoscopy;  Laterality: N/A;   HIATAL HERNIA REPAIR  2007; 2009   SAVORY DILATION N/A 04/01/2021   Procedure: SAVORY DILATION;  Surgeon: Meryl Dare, MD;  Location: [facility];  Service: Endoscopy;  Laterality: N/A;   TOTAL KNEE ARTHROPLASTY Left 11/03/2015   TOTAL KNEE ARTHROPLASTY Left 11/03/2015   Procedure: Left Total Knee Arthroplasty;  Surgeon: Valeria Batman, MD;  Location[facility];  Service: Orthopedics;  Laterality: Left;   TOTAL KNEE ARTHROPLASTY Right 07/17/2018   Procedure: RIGHT TOTAL KNEE ARTHROPLASTY;  Surgeon: Valeria Batman, MD;  Location: [facility] OR;  Service: Orthopedics;  Laterality: Right;   VIDEO BRONCHOSCOPY Bilateral 01/31/2013   Procedure: VIDEO BRONCHOSCOPY WITHOUT FLUORO;  Surgeon: Merwyn Katos, MD;  Location[facility] ENDOSCOPY;  Service: Cardiopulmonary;  Laterality: Bilateral;    Allergies  Allergen Reactions   Ofev [Nintedanib] Hypertension    Immunization History  Administered Date(s) Administered   Fluad Quad(high Dose 65+) 04/20/2021    Influenza, High Dose Seasonal PF 04/22/2019   Influenza-Unspecified 05/10/2022   PPD Test 04/06/2010   Tdap 05/30/2017    Family History  Problem Relation Age of Onset   Colon cancer Neg Hx    Stomach cancer Neg Hx    Esophageal cancer Neg Hx    Colon polyps Neg Hx    Rectal cancer Neg Hx      Current Outpatient Medications:    aspirin (ASPIRIN CHILDRENS) 81 MG chewable tablet, Chew 1 tablet (81 mg total) by mouth daily., Disp: , Rfl:    losartan (COZAAR) 50 MG tablet, Take 1 tablet (50 mg total) by mouth daily., Disp: 90 tablet, Rfl: 1   methotrexate 2.5 MG tablet, Take 3 tablets by mouth once a week., Disp: , Rfl:    multivitamin-iron-minerals-folic acid (CENTRUM) chewable tablet, Chew 1 tablet by mouth daily., Disp: , Rfl:    omeprazole (PRILOSEC) 20 MG capsule, TAKE 1 CAPSULE(20 MG) BY MOUTH DAILY, Disp: 90 capsule, Rfl: 1   Pirfenidone 801 MG TABS, Take 1 tablet (801 mg total) by mouth with breakfast, with lunch, and with evening meal., Disp: 810 tablet, Rfl: 1   rosuvastatin (CRESTOR) 5 MG tablet, TAKE 1 TABLET BY MOUTH EVERY DAY, Disp: 90 tablet, Rfl: 2   Triamcinolone Acetonide (TRIAMCINOLONE 0.1 % CREAM : EUCERIN) CREA, Apply 1 application topically 2 (two) times daily., Disp: 1 each, Rfl: 0      Objective:   Vitals:   02/03/23 1259  BP: 92/60  Pulse: 78  Temp: 97.9 F (  36.6 C)  TempSrc: Oral  SpO2: 99%  Weight: 155 lb (70.3 kg)  Height: 5' 7.5" (1.715 m)    Estimated body mass index is 23.92 kg/m as calculated from the following:   Height as of this encounter: 5' 7.5" (1.715 m).   Weight as of this encounter: 155 lb (70.3 kg).  @WEIGHTCHANGE @  Filed Weights   02/03/23 1259  Weight: 155 lb (70.3 kg)     Physical Exam   General: No distress. Looks well O2 at rest: no Cane present: no Sitting in wheel chair: no Frail: no Obese: no Neuro: Alert and Oriented x 3. GCS 15. Speech normal Psych: Pleasant Resp:  Barrel Chest - no.  Wheeze - no,  Crackles - yes, No overt respiratory distress CVS: Normal heart sounds. Murmurs - no Ext: Stigmata of Connective Tissue Disease - no HEENT: Normal upper airway. PEERL +. No post nasal drip        Assessment:       ICD-10-CM   1. IPF (idiopathic pulmonary fibrosis) (HCC)  J84.112     2. Encounter for medication counseling  Z71.89     3. Travel advice encounter  Z71.84          Plan:     Patient Instructions  IPF (idiopathic pulmonary fibrosis) (HCC)  -slightly progressive dec 2020 -> may 2022 - > stable since starting esbriet summer 2022 -> through feb 2024 -> slightly maybe progressive again as of 02/03/2023   - glad tolerating esbriet well   Plan - = get your blood work resutls from dermatology (LFT) done for methotrexate monitoring - continue esbriet  for IPF - do rpeat spirometry and dlco first week Sept 2024 before [place] trop - appreciate interest in research as a care option but cannot do in 2024 due to travel - use power breathe trainer for developing muscle strength  - avoid sick people and mask during travels   Follow-up -  - next office visit In early sept 2024 months  but after PFT testing  - 30 min with Dr Marchelle Gearing  = symptoms score and walk test at followup   Return in about 9 weeks (around 04/07/2023) for 30 min visit, after Cleda Daub and DLCO, ILD, with Dr Marchelle Gearing, Face to Face Visit.     SIGNATURE    Dr. Kalman Shan, M.D., F.C.C.P,  Pulmonary and Critical Care Medicine Staff Physician, [facility] Director - Interstitial Lung Disease  Program  Pulmonary Fibrosis [facility] [place], [place], [address]  Pager: [phone], If no answer or between  15:00h - 7:00h: call [phone] Telephone: [phone]  1:30 PM 02/03/2023

## 2023-02-03 NOTE — Progress Notes (Signed)
PFT done today. 

## 2023-02-03 NOTE — Patient Instructions (Addendum)
IPF (idiopathic pulmonary fibrosis) (HCC)  -slightly progressive dec 2020 -> may 2022 - > stable since starting esbriet summer 2022 -> through feb 2024 -> slightly maybe progressive again as of 02/03/2023   - glad tolerating esbriet well   Plan - = get your blood work resutls from dermatology (LFT) done for methotrexate monitoring - continue esbriet  for IPF - do rpeat spirometry and dlco first week Sept 2024 before [place] trop - appreciate interest in research as a care option but cannot do in 2024 due to travel - use power breathe trainer for developing muscle strength  - avoid sick people and mask during travels   Follow-up -  - next office visit In early sept 2024 months  but after PFT testing  - 30 min with Dr Marchelle Gearing  = symptoms score and walk test at followup   Return in about 9 weeks (around 04/07/2023) for 30 min visit, after Cleda Daub and DLCO, ILD, with Dr Marchelle Gearing, Face to Face Visit.

## 2023-02-03 NOTE — Patient Instructions (Signed)
PFT done today. 

## 2023-04-04 ENCOUNTER — Other Ambulatory Visit: Payer: Self-pay | Admitting: Cardiology

## 2023-04-04 DIAGNOSIS — I10 Essential (primary) hypertension: Secondary | ICD-10-CM

## 2023-04-10 ENCOUNTER — Other Ambulatory Visit (HOSPITAL_BASED_OUTPATIENT_CLINIC_OR_DEPARTMENT_OTHER): Payer: Self-pay

## 2023-04-10 DIAGNOSIS — J84112 Idiopathic pulmonary fibrosis: Secondary | ICD-10-CM

## 2023-04-11 ENCOUNTER — Encounter: Payer: Self-pay | Admitting: Internal Medicine

## 2023-04-11 ENCOUNTER — Ambulatory Visit: Payer: Medicare Other | Admitting: Internal Medicine

## 2023-04-11 ENCOUNTER — Encounter (HOSPITAL_BASED_OUTPATIENT_CLINIC_OR_DEPARTMENT_OTHER): Payer: Medicare Other

## 2023-04-11 ENCOUNTER — Ambulatory Visit (INDEPENDENT_AMBULATORY_CARE_PROVIDER_SITE_OTHER): Payer: Medicare Other | Admitting: Internal Medicine

## 2023-04-11 VITALS — BP 100/60 | HR 77 | Ht 67.0 in | Wt 152.4 lb

## 2023-04-11 DIAGNOSIS — J84112 Idiopathic pulmonary fibrosis: Secondary | ICD-10-CM | POA: Diagnosis not present

## 2023-04-11 DIAGNOSIS — Z7189 Other specified counseling: Secondary | ICD-10-CM

## 2023-04-11 DIAGNOSIS — R053 Chronic cough: Secondary | ICD-10-CM

## 2023-04-11 DIAGNOSIS — Z7185 Encounter for immunization safety counseling: Secondary | ICD-10-CM

## 2023-04-11 DIAGNOSIS — Z7184 Encounter for health counseling related to travel: Secondary | ICD-10-CM

## 2023-04-11 LAB — HEPATIC FUNCTION PANEL
ALT: 24 U/L (ref 0–53)
AST: 35 U/L (ref 0–37)
Albumin: 3.7 g/dL (ref 3.5–5.2)
Alkaline Phosphatase: 97 U/L (ref 39–117)
Bilirubin, Direct: 0.1 mg/dL (ref 0.0–0.3)
Total Bilirubin: 0.4 mg/dL (ref 0.2–1.2)
Total Protein: 7.2 g/dL (ref 6.0–8.3)

## 2023-04-11 LAB — PULMONARY FUNCTION TEST
DL/VA % pred: 98 %
DL/VA: 4.33 ml/min/mmHg/L
DLCO cor % pred: 49 %
DLCO cor: 13.88 ml/min/mmHg
DLCO unc % pred: 49 %
DLCO unc: 13.88 ml/min/mmHg
FEF 25-75 Pre: 3.29 L/s
FEF2575-%Pred-Pre: 177 %
FEV1-%Pred-Pre: 69 %
FEV1-Pre: 1.72 L
FEV1FVC-%Pred-Pre: 114 %
FEV6-%Pred-Pre: 58 %
FEV6-Pre: 1.96 L
FEV6FVC-%Pred-Pre: 97 %
FVC-%Pred-Pre: 60 %
FVC-Pre: 1.97 L
Pre FEV1/FVC ratio: 87 %
Pre FEV6/FVC Ratio: 100 %

## 2023-04-11 MED ORDER — DOXYCYCLINE HYCLATE 100 MG PO TABS: 100.0000 mg | ORAL_TABLET | Freq: Two times a day (BID) | ORAL | 0 refills | Status: DC

## 2023-04-11 MED ORDER — PAXLOVID (150/100) 10 X 150 MG & 10 X 100MG PO TBPK: 20.0000 | ORAL_TABLET | Freq: Every day | ORAL | 0 refills | Status: DC

## 2023-04-11 MED ORDER — BENZONATATE 200 MG PO CAPS: 200.0000 mg | ORAL_CAPSULE | Freq: Three times a day (TID) | ORAL | 3 refills | Status: DC | PRN

## 2023-04-11 MED ORDER — PREDNISONE 10 MG PO TABS: ORAL_TABLET | ORAL | 0 refills | Status: AC

## 2023-04-11 NOTE — Patient Instructions (Signed)
Performed Spirometry and DLCO Today.  ?

## 2023-04-11 NOTE — Progress Notes (Signed)
NOV 2020 with Dr Vassie Loll  77 year old never smoker of [place] origin initial consult 05/2019  for evaluation of hoarseness, dry cough and congestion. He is originally from [place], [place], [place]-immigrated to the [place] in 1971 for his bachelors to [place] and then settled in [place] in 1978 and got into [employer] business.  PMH - - psoriatic arthritis by Dr. Orene Desanctis, was originally on Humira and is now on Cosentyx. - latent TB that was untreated initially and in 12/2012 he presented with a mild alveolitis on CT scan and pleural effusion that required thoracentesis, bronchoscopy was negative for AFB but he was treated under the guidance of local health department and ID with multiple drug antituberculous treatment for 3 months.  His problem lists extrapulmonary TB but on my review all AFB cultures were negative.   -07/2018 chest x-ray shows eventration of right hemidiaphragm.   - vocal cord paralysis in 04/2015 and underwent ENT evaluation for persistent dysphonia and underwent gel injection of the vocal cords at [facility] without any relief  Chief Complaint  Patient presents with   Follow-up    1 month rov- review CT chest 10/31.  pt states his s/s are unchanged since last month, prod cough with clear mucus qam that subsides throughout the day.      Symptoms are persistent. Main issues are hoarseness, dry cough and congestion  We reviewed HRCT in detail today  CT angiogram 10/2015 -which shows mild reticular densities suggestive but not diagnostic of ILD.  HRCT 05/31/19 mild, bibasilar predominant pulmonary fibrosis in a pattern featuring subtle subpleural reticular interstitial opacity, mild traction bronchiectasis and occasional areas of Bronchiolectasis - ' probable UIP ' - slight worse   OV Alan Roman Jan 2021  77 year old male never smoker presenting to our office today after completing pulmonary function testing.  Patient was last seen in our office in October/2020  for initial consult with Dr. Vassie Loll.  At that office visit patient was scheduled for pulmonary function testing, lab work was completed to further evaluate patient's probable UIP on October/2020 high-resolution CT chest, patient was referred to ear nose and throat, swallow studies were also ordered on the patient.  Patient completed ENT referral in 07/02/2019 with Dr. Merril Abbe Recommendations for voice therapy, that he follow-up with gastroenterology regarding this Schatzki's ring.  Follow-up in April.  Proceed forward with pulmonary pulmonary function test.  Patient reports that he is received a telephone call to restart voice rehab but he has not done this yet as he wanted to review pulmonary function testing with our office first.  Patient swallow studies are listed below:  06/06/2019-swallow study-mild aspiration risk, no treatment recommended at this time 06/07/2019-barium swallow-Schatzki ring at the GE junction which prevents passage of a 13 mm barium tablet  Patient has a gastroenterologist Dr. Loreta Ave but he has not seen her in over a year.  Patient has no scheduled upcoming appointments.  Patient continues to follow-up with rheumatology, Dr. Corliss Skains.  Per chart review last office note was in July/2020.  Patient presenting today to review December/2020 pulmonary function test.  Those results are listed below:  07/19/2019-pulmonary function test-FVC 2.32 (63% predicted), postbronchodilator ratio 93, postbronchodilator FEV1 1.93 (69% predicted), DLCO 13.59 (43% predicted) >>>Severe restriction, moderately severe diffusion defect  Lab work from November/2020 are also listed below:  06/03/2019-ANA-1: 40, nuclear homogenous 06/03/2019-rheumatoid factor-14 06/03/2019-anti-Jo 1-less than 0.2 06/03/2019-aldolase-4.9 06/03/2019-centromere antibodies-negative 06/03/2019-antiscleroderma antibody-less than 1 - 06/03/2019-Sjogren's syndrome 8 and B-negative 06/03/2019-hypersensitivity  pneumonitis-negative 06/03/2019-sed rate  22  Mildly elevated ANA and rheumatoid factor.  Felt to be not clinically significant.  Rest of results are negative.  Patient continues to report that he is asymptomatic.  Patient tolerates walk in office without any oxygen desaturations.  He does continue to have occasional bouts of congestion which is believed to be related to the Cosentyx.  Family has messaged rheumatology regarding this.  Tests:   07/19/2019-pulmonary function test-FVC 2.32 (63% predicted), postbronchodilator ratio 93, postbronchodilator FEV1 1.93 (69% predicted), DLCO 13.59 (43% predicted) >>>Severe restriction, moderately severe diffusion defect  07/16/2019-SARS-CoV-2-not detected  06/03/2019-ANA-1: 40, nuclear homogenous 06/03/2019-rheumatoid factor-14 06/03/2019-anti-Jo 1-less than 0.2 06/03/2019-aldolase-4.9 06/03/2019-centromere antibodies-negative 06/03/2019-antiscleroderma antibody-less than 1 - 06/03/2019-Sjogren's syndrome 8 and B-negative 06/03/2019-hypersensitivity pneumonitis-negative 06/03/2019-sed rate 22  06/01/2019-CT chest high-res mild, bibasilar predominant pulmonary fibrosis in a pattern with featuring subtle subpleural reticular interstitial opacity and mild traction bronchiectasis occasional areas of bronchiolectasis, probable UIP  06/06/2019-swallow study-mild aspiration risk, no treatment recommended at this time  06/07/2019-barium swallow-Schatzki ring at the GE junction which prevents passage of a 13 mm barium tablet  FENO:  No results found for: NITRICOXIDE  MH: Psoriatic arthritis (Dr. Corliss Skains - prev on Humira, now on Cosentyx), latent TB that was untreated initially in 12/2012 with mild alveolitis on CT scan and pleural effusion that required thoracentesis, bronchoscopy was negative for AFB but he was treated under guidance with local health department infectious disease with multidrug antituberculosis treatment for 3 months, eventration of right  hemidiaphragm, vocal cord paralysis in September/2016 underwent ENT evaluation for persistent dysphonia and underwent gel injection of vocal cords at [facility] without any relief Smoker/ Smoking History: Never smoker Maintenance: None   He is originally from [place], [place]-immigrated to the [place] in 1971 for his bachelors to [place] and then settled in [place] in 1978 and got into [employer] business.    OV 11/10/2020  Subjective:  Patient ID: Alan Roman, male , DOB: 1945/11/04 , age 41 y.o. , MRN: [id number] , ADDRESS: [address] [place] [address] PCP Ignatius Specking, MD Patient Care Team: Ignatius Specking, MD as PCP - General (Internal Medicine)  This Provider for this visit: Treatment Team:  Attending Provider: Kalman Shan, MD    11/10/2020 -   Chief Complaint  Patient presents with   Follow-up    Increased coughing for past 3 months, sometimes productive with whitish sputum. Denies shortness of breath.      HPI [facility] 77 y.o. -asked by Dr. Jacinto Halim to establish at the pulmonary fibrosis center.  Therefore patient is seeing Dr. Marchelle Gearing.  Patient used to be previously followed by Dr. Vassie Loll.  Last seen by Dr. Vassie Loll in 2020.  Then seen by nurse practitioner Alan Roman in January 2021.   His daughtre [name] later joined on the phone call.  Patient is known to have ILD probable UIP pattern.  Is on observation at this point.   [place] Integrated Comprehensive ILD Questionnaire  Symptoms:  -   he reports insidious onset of dyspnea for the last 4 months.  Since it started it is the same.  Although his ILD is present even before that.  He says that he has a very mild cough however in the last 4 to 6 months it is worse.  Particularly when he talks for a long time.  He does not wake up in the middle of the night because of the cough he does bring up some white sputum that is very light white.  Cough does  not affect his voice.  He does clear his  throat.  He has never had hemoptysis.  He does not cough when he lies down.  No nausea vomiting or diarrhea.  No wheezing.  Cough does get worse with eating  Past Medical History : He has psoriatic arthritis.  He is immunosuppressed and is on immunomodulators.  Currently on Simponi.  He does not have rheumatoid arthritis.  No heart failure.  No Alan collagen vascular disease of connective tissue disease.  No COPD.  No asthma.  No HIV.  No hiatal hernia.  No pulmonary hypertension.  No diabetes.  No thyroid disease.  No stroke no seizures.  No tuberculosis no kidney disease no pneumonia.  No blood clots no heart disease no pleurisy.   He is deemed to be an aspiration risk he has vocal cord paralysis.  He is to see Dr. Loreta Ave GI.  He wants to switch to [facility] but because of the pandemic has not been able to establish.  ROS: He does not have any fatigue.  No arthralgia.  No  dysphagia.  No Raynaud's.  No dry eyes no fever no weight loss.  No nausea no vomiting.  No heartburn no snoring no rash no ulcers.   FAMILY HISTORY of LUNG DISEASE: No pulmonary fibrosis.  No COPD.  No asthma no sarcoid no cystic fibrosis no hypersensitive pneumonitis no autoimmune disease in the family.    EXPOSURE HISTORY: No tobacco.  No smoking cigars no smoking pipes.  No electronic cigarette use.  No vaping marijuana.  No cocaine use.  No intravenous drug use.   HOME and HOBBY DETAILS : He lives in a 78 year old family home.  He is lived there for 40 years.  No damp environment.  No mold or mildew.  No humidifier use no CPAP use no nebulizer use.  No steam iron use.  No Jacuzzi use.  No misting Fountain.  No pet birds or parakeets.  No feather pillows or blankets.  No mold in the [facility] duct.  No music habits no gardening habits.  Never exposed to birds or chickens.  No water damage in the basement.  No straw meds.  No hot tub use.  No animals at work.    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  Organic antigen exposure history is negative.  Inorganic antigen exposure history is negative.   PULMONARY TOXICITY HISTORY (27 items):  Simponi for psoriatic arthritis      .OV 12/18/2020  Subjective:  Patient ID: Alan Roman, male , DOB: 1946-06-09 , age 77 y.o. , MRN: [id number] , ADDRESS: [address] [place] [address] PCP Ignatius Specking, MD Patient Care Team: Ignatius Specking, MD as PCP - General (Internal Medicine) Yates Decamp, MD as PCP - Cardiology (Cardiology)  This Provider for this visit: Treatment Team:  Attending Provider: Kalman Shan, MD    12/18/2020 -   Chief Complaint  Patient presents with   Follow-up    PFT performed today, HRCT performed 4/25, and EGD scheduled 5/23.  Pt states he has been okay since last visit. States his only concern is hoarseness with voice and an occ cough.     HPI [facility] 77 y.o. -returns for follow-up to discuss his test results.  He continues to have hoarse voice.  He denies any dysphagia.  He is now scheduled for endoscopy 12/21/2020.  He is going to see cardiology ahead of  that.  His serologies negative.  His symptoms continue to be minimal and stable.  He had high-resolution CT scan of the chest that shows progression compared to October 2020.  It is described as probable UIP.  Back in October 2020 there was craniocaudal gradient described and no air trapping.  This time April 2022 radiologist feels there is slight upper lobe predominance and there is air trapping.  This latter description raises the question of possible chronic hypersensitive pneumonitis even though the final conclusion was probable UIP.  I went over exposure history with him again.  No organic antigen exposure history identified.  Nevertheless disease appears progressive.    CT chest HIgh res 11/23/20   IMPRESSION: CLINICAL DATA:  77 year old male with history of bronchiectasis. Follow-up study.   EXAM: CT  CHEST WITHOUT CONTRAST   TECHNIQUE: Multidetector CT imaging of the chest was performed following the standard protocol without intravenous contrast. High resolution imaging of the lungs, as well as inspiratory and expiratory imaging, was performed.   COMPARISON:  Chest CT 05/31/2019.   FINDINGS: Cardiovascular: Heart size is normal. There is no significant pericardial fluid, thickening or pericardial calcification. There is aortic atherosclerosis, as well as atherosclerosis of the great vessels of the mediastinum and the coronary arteries, including calcified atherosclerotic plaque in the left main, left anterior descending and left circumflex coronary arteries.   Mediastinum/Nodes: No pathologically enlarged mediastinal or hilar lymph nodes. Please note that accurate exclusion of hilar adenopathy is limited on noncontrast CT scans. Esophagus is unremarkable in appearance. No axillary lymphadenopathy.   Lungs/Pleura: High-resolution images demonstrate widespread patchy regions of ground-glass attenuation, septal thickening, mild cylindrical bronchiectasis and peripheral bronchiolectasis. Findings have no discernible craniocaudal gradient, and there is significant involvement in the anterior aspects of the upper lobes of the lungs bilaterally. No frank honeycombing. Inspiratory and expiratory imaging demonstrates some mild air trapping indicative of mild small airways disease. Minimal progression compared to the prior examination. No acute consolidative airspace disease. No pleural effusions. No suspicious appearing pulmonary nodules or masses are noted.   Upper Abdomen: Aortic atherosclerosis. 5 mm calcified gallstone in the neck of the gallbladder. Small calcified granuloma in the spleen incidentally noted.   Musculoskeletal: There are no aggressive appearing lytic or blastic lesions noted in the visualized portions of the skeleton 1. The appearance of the lungs is  compatible with interstitial lung disease, with a spectrum of findings once again categorized as probable usual interstitial pneumonia (UIP) per current ATS guidelines. Minimal progression of disease compared to the prior examination. 2. Aortic atherosclerosis, in addition to left main and 2 vessel coronary artery disease. Assessment for potential risk factor modification, dietary therapy or pharmacologic therapy may be warranted, if clinically indicated. 3. Cholelithiasis.   Aortic Atherosclerosis (ICD10-I70.0).     Electronically Signed   By: Trudie Reed M.D.   On: 11/24/2020 08:04   xxxx Results for JACQUE, VIRGINIA "JAGDISH" (MRN [id number]) as of 12/18/2020 16:47  Ref. Range 06/03/2019 10:42  Aspergillus Fumigatus Latest Ref Range: NEGATIVE  NEGATIVE  Pigeon Serum Latest Ref Range: NEGATIVE  NEGATIVE  Anti Nuclear Antibody (ANA) Latest Ref Range: NEGATIVE  POSITIVE (A)  ANA Pattern 1 Unknown Nuclear, Homogeneous (A)  ANA Titer 1 Latest Units: titer 1:40 (H)  Anti JO-1 Latest Ref Range: 0.0 - 0.9 AI <0.2  CENTROMERE AB SCREEN Latest Ref Range: <1.0 NEG AI <1.0 NEG  RA Latex Turbid. Latest Ref Range: <14 IU/mL 14 (H)  SSA (Ro) (ENA) Antibody, IgG Latest Ref Range: <  1.0 NEG AI <1.0 NEG  SSB (La) (ENA) Antibody, IgG Latest Ref Range: <1.0 NEG AI <1.0 NEG  Scleroderma (Scl-70) (ENA) Antibody, IgG Latest Ref Range: <1.0 NEG AI <1.0 NEG    OV 01/21/2021  Subjective:  Patient ID: Alan Roman, male , DOB: April 26, 1946 , age 51 y.o. , MRN: [id number] , ADDRESS: [address] [place] [address] PCP Ignatius Specking, MD Patient Care Team: Ignatius Specking, MD as PCP - General (Internal Medicine) Yates Decamp, MD as PCP - Cardiology (Cardiology)  This Provider for this visit: Treatment Team:  Attending Provider: Kalman Shan, MD    01/21/2021 -   Chief Complaint  Patient presents with   Follow-up    Pt states he has been doing okay since last visit.  States that he has not started on the OFEV medication yet.     HPI [facility] 77 y.o. -presents for follow-up in this visit he was supposed to have started nintedanib by now.  However he is yet to start this.  There are  supply chain issues.  But he says it is approved.  Overall no change in symptoms.  Unclear if he is finished his GI work-up.  He continues to have some hoarse voice.  He has not seen ENT.  He is accepted my recommendation to have a referral.  He did asked me whether a few episode of chlorine exposure acutely 40 years ago could have caused his pulmonary fibrosis.  I told him while it was possible it is unlikely.  Unclear about single episode although possible as be seen with COVID   02/18/2021 Patient contacted today for 4 week follow-up. He started OFEV anti-fibrotic on June 30th. He developed headache and diarrhea after 2 days of starting medication. He stopped taking OFEV 4 days ago d/t GI symptoms and fatigue. He also noted that his blood pressure was elevated. His symptoms have resolved since stopping medication.    MDD confderence Sept 2022  Dr Ashley Murrain - November 23, 2020 - HRCT - compared to CTA 2017 and HRCT 2020. Recent CT  moderage patchy retic, and some GGO. TB is mild . Findings are distriubted evenly - no CCG.  No frank HC. clearly tehre is fibrotic ILD. Air trapping is ver minimal and in lower lobes and is physiologic and not clinicall signifcant. No AT in upper lobe or middle lobe/lingulare   Final DX: Prob UIP and ther is mild progression since 2020 and defintiey since 2017.   Clinical dx:  IPF and Rx as such   OV 04/20/2021  Subjective:  Patient ID: Alan Roman, male , DOB: 04-25-1946 , age 74 y.o. , MRN: [id number] , ADDRESS: [address] [place] [address] PCP Ignatius Specking, MD Patient Care Team: Ignatius Specking, MD as PCP - General (Internal Medicine) Yates Decamp, MD as PCP - Cardiology (Cardiology)  This Provider for this visit:  Treatment Team:  Attending Provider: Kalman Shan, MD    04/20/2021 -   Chief Complaint  Patient presents with   Follow-up    Pt states his BP is still increasing even after going to 1 pill of the OFEV a day.   IPF: age greater than 65, Velcro crackles at the base, previous description of probable UIP and nearly negative serology [psoriatic arthritis does not count is connective tissue disease ILD], vocal cord paralysis with some aspiration risk that this is IPF.   - Dx given June 2022 - Confirmd MDD conferne Sept 2022   -  Ofev June 2022  HPI [facility] 76 y.o. -presents for follow-up.  I am meeting his wife for the first time.  He feels he is stable.  He is walking 30 minutes in the mornings and he can do this without stopping.  He says his dyspnea is not worse and his cough is not worse but objectively on the subjective symptom questionnaire there might be progression.  We did discuss his case in the ILD conference.  The diagnosis is IPF.  There is progression.  In terms of therapeutics we started him on nintedanib at 150 mg twice daily but he called saying that he was intolerant to it because of hypertension [anecdotally he is my first patient with hypertension in 6 years with nintedanib] but also diarrhea and fatigue.  He then stopped it and apparently his blood pressure normalized.  Then 04/10/2021 he started himself on nintedanib 100 mg strength.  He is currently doing only 1 time daily.  He feels his blood pressure is going up again to 140 systolic.  [At 150 twice a day it was at 150 systolic] and this is no normal.  He is not having any Alan symptoms.  We discussed the option of switching to pirfenidone and going through approval process again.  We discussed the side effects of skin issues, fatigue, nausea, anorexia and ongoing liver monitoring with significant overlap with nintedanib side effects.  At this point in time he is resolved to increase his nintedanib to 100 mg  twice daily and if he is tolerating well and that go to 150 mg twice daily.  If this fails then he will go to pirfenidone.  End of November 2022 he is going to go to [place] till February 2022.  He will have a high-dose flu shot today.         OV 06/07/2021  Subjective:  Patient ID: Alan Roman, male , DOB: 09/17/1945 , age 22 y.o. , MRN: [id number] , ADDRESS: [address] [place] [address] PCP Ignatius Specking, MD Patient Care Team: Ignatius Specking, MD as PCP - General (Internal Medicine) Yates Decamp, MD as PCP - Cardiology (Cardiology)  This Provider for this visit: Treatment Team:  Attending Provider: Kalman Shan, MD    06/07/2021 -   Chief Complaint  Patient presents with   Follow-up    4wk f/u after starting Ofev. Stopped the Ofev about 3 weeks ago due to increased blood pressure. Plans to restart it next Monday. Denies any increased nausea, diarrhea or loss of appetite.     HPI [facility] 77 y.o. -returns for his IPF follow-up.  This particular visit is to focus on how he is tolerating his nintedanib.  He is extremely concerned his nintedanib is causing hypertension.  When he was taking 100 mg which is the lower dose at 1 pill/day he did not have hypertension.  He then increase it to 100 mg twice daily [this is the lowest therapeutic dose].  He says this caused his blood pressure systolic to go up to 140s.  He has since stopped taking all nintedanib.  His blood pressure is now normal.  He does not want take nintedanib anymore.  We discussed the alternative of taking pirfenidone.  He is open to this idea.  Pirfenidone is not known to cause hypertension.  In fact even at nintedanib I am not seen any patient developed hypertension except him.  We discussed the Alan side effects of pirfenidone including GI issues, weight loss, nausea, liver  function test monitoring and necessity for sunscreen.  He is willing to do all this.  He has upcoming travel to  [place] towards the end of November 2022 through February 2023.  We explained that not taking the antifibrotic but somewhat less than 5% chance of progression over 3 months.  He does not want to take this risk because of prior progression.  He just wants to take the medication pirfenidone.  So we will make a request for this.  I did advise him to register himself with a pulmonologist and his home city in [place].  I also spoke to his daughter over the phone.  They will arrange this.  He is willing to do video and telephone visit with me when he is in [place].  Of note he developed back pain with right-sided sciatica 3 weeks ago.  He has right-sided sciatica.  He had an MRI that shows DJD findings.  He will follow-up with Dr. Cleophas Dunker orthopedics for this.  He and his wife and daughter Huel Cote on the phone] feel that he may not make it [place] because of the back pain.  I advised her to talk about this with Dr. Cleophas Dunker.  Of note he has upcoming pulmonary function test appointment in 1 week.  He also has office visit.  We discussed this with him.  He will reschedule his office visit as a televisit to discuss his pirfenidone uptake and his pulmonary function test combined.  He will do this from [place].      OV 06/15/2021  Subjective:  Patient ID: IZZY BICKHART, male , DOB: Mar 27, 1946 , age 78 y.o. , MRN: [id number] , ADDRESS: [address] [place] [address] PCP Ignatius Specking, MD Patient Care Team: Ignatius Specking, MD as PCP - General (Internal Medicine) Yates Decamp, MD as PCP - Cardiology (Cardiology)  This Provider for this visit: Treatment Team:  Attending Provider: Kalman Shan, MD    06/15/2021 -   Chief Complaint  Patient presents with   Follow-up    Pft done today. No new concerns     HPI [facility] 77 y.o. -returns for follow-up.  His wife is here with him.  His symptom score shows minimal symptoms and is actually better.  His pulmonary function test shows  slight worsening compared to 2 years ago.  But he is not feeling this.  He is off his nintedanib and is feeling better.  He is decided to start pirfenidone.  I did indicate to him that his progression.  He is here to start.  Pirfenidone because although approved the shipment is pending.  I sent a message to the pharmacist to see when his shipment would arrive.  His weight is stable.  He is doing some physical therapy for his back.  He is going to see Dr. Cleophas Dunker tomorrow.  He will register with a  pulmonologist in [place].  While in [place] once his [place] pirfenidone runs he will get local pirfenidone.  He will get this through his pulmonologist there.       OV 05/17/2022  Subjective:  Patient ID: Alan Roman, male , DOB: [birth year] , age 60 y.o. , MRN: [id number] , ADDRESS: [address] [place] [address] PCP Ignatius Specking, MD Patient Care Team: Ignatius Specking, MD as PCP - General (Internal Medicine) Yates Decamp, MD as PCP - Cardiology (Cardiology)  This Provider for this visit: Treatment Team:  Attending Provider: Kalman Shan, MD  05/17/2022 -   Chief Complaint  Patient presents with   Follow-up    Doing well today. No sx noted     HPI Alan Roman [facility] 77 y.o. -returns for follow-up.  Last seen almost a year ago.  After that he went to [place] and came back in February 2023.  Then after coming back from [place] he was in [place] with his son for several months and then spending a lot of time in [place] with his grandson.  Because of this he did not make follow-up visits with [place].  While in [place] he was seen by a physician who he knows personally.  During this entire time it appears there was no liver function test monitoring.  I asked him why he missed his appointments with [place].  He states that after he came back from [place] he has been spending time in different places.  I did indicate to him that close follow-up is needed.  In any event symptoms are stable  he is tolerating pirfenidone really well.  There is only a 2 pound difference since the last year.  He is going to go back to [place] again later this month and return in February 2024.  His close friend who is also my patient with IPF recently had COVID-related IPF flareup.  He did mention this to me.  Of note he is status flu shot and also COVID booster according to his history.  He plans to have RSV vaccine before his travel to [place].     OV 10/06/2022  Subjective:  Patient ID: Alan Roman, male , DOB: 06-28-1946 , age 69 y.o. , MRN: [id number] , ADDRESS: [address] [address] PCP Ignatius Specking, MD Patient Care Team: Ignatius Specking, MD as PCP - General (Internal Medicine) Yates Decamp, MD as PCP - Cardiology (Cardiology)  This Provider for this visit: Treatment Team:  Attending Provider: Kalman Shan, MD  IPF: age greater than 65, Velcro crackles at the base, previous description of probable UIP and nearly negative serology [psoriatic arthritis does not count is connective tissue disease ILD], vocal cord paralysis with some aspiration risk that this is IPF.   - Dx given June 2022 - Confirmd MDD conferne Sept 2022   - Ofev June 2022 - stopped Oct/Nov 2022 due to hypertension  -Pirfenidone start November 2022   10/06/2022 -   Chief Complaint  Patient presents with   Follow-up    Pft review, no c/o      HPI Julious [facility] 77 y.o. -returns for follow-up.  Last seen towards the end of last year.  After that he went for an extended trip to [place] to his native state.  He says he did really well in [place].  The only issue was that 1 time he had inguinal pain on the left lower quadrant was seen by urologist and diagnosed with bilateral inguinal hernia.  He is followed up with his primary care physician has been reassured.  Otherwise no medical problems no changes in medications no ER visits no urgent care visits.  He is tolerating pirfenidone at this full dose  quite well.  Shortness of breath has not changed.  In fact his walking desaturation test today stable.  Symptom scores are stable.  He also had pulmonary function test that shows continued stability in FVC and DLCO since starting pirfenidone.  He has not lost any weight.  He states he is up-to-date with his respiratory vaccines.  He has an upcoming repeat trip to [place] for 5 weeks  because he is building a house.  We briefly discussed clinical trials as a care option and he is interested        OV 02/03/2023  Subjective:  Patient ID: Alan Roman, male , DOB: 10/31/1945 , age 33 y.o. , MRN: [id number] , ADDRESS: [address] [place] [address] PCP Ignatius Specking, MD Patient Care Team: Ignatius Specking, MD as PCP - General (Internal Medicine) Yates Decamp, MD as PCP - Cardiology (Cardiology)  This Provider for this visit: Treatment Team:  Attending Provider: Kalman Shan, MD     02/03/2023 -   Chief Complaint  Patient presents with   Follow-up    Discuss pft      HPI Kwali Bedel 77 y.o. -returns for follow-up.  Presents with his wife.  Wife is an independent historian.  Both attest that he is doing stable and well.  He is tolerating pirfenidone well.  He is supposed to have liver function test today for high risk prescription therapeutic monitoring while on pirfenidone [he is also on methotrexate].  However he states he had his liver function test with the dermatologist.  I do not have the results with me.  But he states these were normal.  He will relay those results to me.  Therefore he does not want blood testing today.  We reviewed his pulmonary function test.  I personally visualized it.  In my personal independent interpretation there is a slight reduction in FVC and DLCO over time.  I shared this with him and his wife.  However symptom wise he is stable.  On exercise hypoxemia test he does show a tendency to desaturate but he is asymptomatic.  He is going  to [place] in mid September 2024 through April 2025 because he is building a house there.  He wants to get a visit done right before that.  So we will see him back in 9 weeks with a breathing test.  He is interested in clinical trials as a care option but I did indicate to him that travel will be a barrier for his participation.  Wife wanted to know about any exercises.  I did show him POWERbreathe inspiratory muscle training.  He is using another device that I recommended in the past.  Is more like a flutter valve.  They will buy the POWERbreathe.     OV 04/11/2023  Subjective:  Patient ID: Alan Roman, male , DOB: 06-22-1946 , age 33 y.o. , MRN: [id number] , ADDRESS: [address] [place] [address] PCP Ignatius Specking, MD Patient Care Team: Ignatius Specking, MD as PCP - General (Internal Medicine) Yates Decamp, MD as PCP - Cardiology (Cardiology)  This Provider for this visit: Treatment Team:  Attending Provider: Kalman Shan, MD   IPF: age greater than 65, Velcro crackles at the base, previous description of probable UIP and nearly negative serology [psoriatic arthritis does not count is connective tissue disease ILD], vocal cord paralysis with some aspiration risk that this is IPF.   - Dx given June 2022 - Confirmd MDD conferne Sept 2022   - Ofev June 2022 - stopped Oct/Nov 2022 due to hypertension  -  -Pirfenidone start November 2022  On Methotrexate for psoriatc arthritis  Esbriet/Pirfenidone requires intensive drug monitoring due to high concerns for Adverse effects of , including  Drug Induced Liver Injury, significant GI side effects that include but not limited to Diarrhea, Nausea, Vomiting,  and Alan system side effects that include Fatigue, headaches, weight  loss and Alan side effects such as skin rash. These will be monitored with  blood work such as LFT initially once a month for 6 months and then quarterly  04/11/2023 -   Chief Complaint  Patient presents  with   Follow-up    F/up on PFT    Last Weight  Most recent update: 04/11/2023  1:33 PM    Weight  69.1 kg (152 lb 6.4 oz)               HPI Kaikoa Siegel 77 y.o. -returns for follow-up presents with his wife she is an independent historian.  They both attest he is doing well except that he has cough and she wants a medication for this.  In addition he is going to [place] in a few days and is going to return in April 2025.  They want travel antibiotics and Paxlovid.  I am willing to give this to him.  From a respiratory standpoint he is tolerating pirfenidone well.  There are no changes in his complaints his sit/stand hypoxemia test is good without any desaturations.He feels he is stable.  Review and visualization of the pulmonary function test today shows at this time that he had a slight decline.  Hard to determine.    SYMPTOM SCALE - ILD 11/10/2020 and 12/18/2020   01/21/2021  04/20/2021 ofev 06/15/2021 158# - off ofev. Going to start esbriet 05/17/2022 156#ra 10/06/2022 157# 02/03/2023  04/11/2023 162#  O2 use ra ra ra ra ra ra ra ra  Shortness of Breath 0 -> 5 scale with 5 being worst (score 6 If unable to do)         At rest 0 0 0 0 0 0 0 0  Simple tasks - showers, clothes change, eating, shaving 0 0 0 0 0 0 0 0  Household (dishes, doing bed, laundry) 0 1 2 0 0 0 1 0  Shopping 0 0 0 0 0 0 0 0  Walking level at own pace 1 0 1 0 0 0 0   Walking up Stairs 1 1 1 1 1 2 1    Total (30-36) Dyspnea Score 2 2 4 1 1 2 2    How bad is your cough? 1 1 2.5 0 1 1 0   How bad is your fatigue x 0 0 0 0 0 0   How bad is nausea 0 0 0 0 o 0 0   How bad is vomiting?  0 00 0 0 0 000 0   How bad is diarrhea? 0  0 0 0 0 0   How bad is anxiety? x 0 0 0 0 0 0   How bad is depression x 0 0 0 0 00 0     Simple office walk 185 feet x  3 laps goal with forehead probe 11/10/2020  06/07/2021  05/17/2022 With 10/06/2022  02/03/2023  04/11/2023   O2 used ra ra Walk test ra ra Sit stand Sit stand   Number laps completed 3 3 3 3 15 15   Comments about pace avg  Mo dpace brisk Very briks brisk  Resting Pulse Ox/HR 100% and 66/min 99% and HR 79 100% and= HR 56 100% and HR 87 99% and HR 70 96% and HR 94  Final Pulse Ox/HR 100% and 103/min 97% and HR 102 100% and HR 105 100% and HR 104 95% and HR 102 97% and HR 93 with mask on  Desaturated </= 88% no no no  Desaturated <= 3% points no no no  Droppd 4 points no  Got Tachycardic >/= 90/min yes yes yes     Symptoms at end of test none Mild dyspnea No complaints none No dyspnea No dyspnea  Miscellaneous comments x stabe stable stabte       PFT     Latest Ref Rng & Units 04/11/2023   11:14 AM 02/03/2023   10:43 AM 09/28/2022   11:08 AM 06/15/2021   12:51 PM 12/18/2020    2:23 PM 07/19/2019    1:38 PM  ILD indicators  FVC-Pre L 1.97  P 2.01  2.11  2.07  1.98  2.32   FVC-Predicted Pre % 60  P 61  63  62  59  63   FVC-Post L  1.93    2.01  2.06   FVC-Predicted Post %  58    60  56   TLC L  3.81    3.60  3.80   TLC Predicted %  59    55  55   DLCO uncorrected ml/min/mmHg 13.88  P 11.74  12.95  12.64  14.24  13.59   DLCO UNC %Pred % 49  P 41  45  44  50  43   DLCO Corrected ml/min/mmHg 13.88  P 11.74  12.95  12.64  14.24    DLCO COR %Pred % 49  P 41  45  44  50      P Preliminary result      LAB RESULTS last 96 hours No results found.  LAB RESULTS last 90 days Recent Results (from the past 2160 hour(s))  Pulmonary function test     Status: None   Collection Time: 02/03/23 10:43 AM  Result Value Ref Range   FVC-Pre 2.01 L   FVC-%Pred-Pre 61 %   FVC-Post 1.93 L   FVC-%Pred-Post 58 %   FVC-%Change-Post -4 %   FEV1-Pre 1.75 L   FEV1-%Pred-Pre 70 %   FEV1-Post 1.78 L   FEV1-%Pred-Post 71 %   FEV1-%Change-Post 1 %   FEV6-Pre 2.01 L   FEV6-%Pred-Pre 59 %   FEV6-Post 1.93 L   FEV6-%Pred-Post 57 %   FEV6-%Change-Post -4 %   Pre FEV1/FVC ratio 87 %   FEV1FVC-%Pred-Pre 113 %   Post FEV1/FVC ratio 92 %    FEV1FVC-%Change-Post 6 %   Pre FEV6/FVC Ratio 100 %   FEV6FVC-%Pred-Pre 97 %   Post FEV6/FVC ratio 100 %   FEV6FVC-%Pred-Post 97 %   FEV6FVC-%Change-Post 0 %   FEF 25-75 Pre 3.06 L/sec   FEF2575-%Pred-Pre 165 %   FEF 25-75 Post 3.50 L/sec   FEF2575-%Pred-Post 188 %   FEF2575-%Change-Post 14 %   RV 1.52 L   RV % pred 62 %   TLC 3.81 L   TLC % pred 59 %   DLCO unc 11.74 ml/min/mmHg   DLCO unc % pred 41 %   DLCO cor 11.74 ml/min/mmHg   DLCO cor % pred 41 %   DL/VA 1.61 ml/min/mmHg/L   DL/VA % pred 83 %  Pulmonary function test     Status: None (Preliminary result)   Collection Time: 04/11/23 11:14 AM  Result Value Ref Range   FVC-Pre 1.97 L   FVC-%Pred-Pre 60 %   FEV1-Pre 1.72 L   FEV1-%Pred-Pre 69 %   FEV6-Pre 1.96 L   FEV6-%Pred-Pre 58 %   Pre FEV1/FVC ratio 87 %   FEV1FVC-%Pred-Pre 114 %   Pre FEV6/FVC Ratio 100 %   FEV6FVC-%Pred-Pre  97 %   FEF 25-75 Pre 3.29 L/sec   FEF2575-%Pred-Pre 177 %   DLCO unc 13.88 ml/min/mmHg   DLCO unc % pred 49 %   DLCO cor 13.88 ml/min/mmHg   DLCO cor % pred 49 %   DL/VA 8.46 ml/min/mmHg/L   DL/VA % pred 98 %         has a past medical history of Acute bronchiolitis due to Alan infectious organisms (01/26/2013), Arthritis, Extrapulmonary TB (tuberculosis) (02/02/2013), GERD (gastroesophageal reflux disease), High risk medication use (12/27/2016), History of hiatal hernia (2007; 2009), Hyperlipidemia, Hypertension, and Pleural effusion (01/26/2013).   reports that he has never smoked. He has never used smokeless tobacco.  Past Surgical History:  Procedure Laterality Date   BIOPSY  04/01/2021   Procedure: BIOPSY;  Surgeon: Meryl Dare, MD;  Location: [facility];  Service: Endoscopy;;   CATARACT EXTRACTION W/ INTRAOCULAR LENS  IMPLANT, BILATERAL Bilateral    ESOPHAGOGASTRODUODENOSCOPY (EGD) WITH PROPOFOL N/A 04/01/2021   Procedure: ESOPHAGOGASTRODUODENOSCOPY (EGD) WITH PROPOFOL;  Surgeon: Meryl Dare, MD;  Location: [facility]  ENDOSCOPY;  Service: Endoscopy;  Laterality: N/A;   HIATAL HERNIA REPAIR  2007; 2009   SAVORY DILATION N/A 04/01/2021   Procedure: SAVORY DILATION;  Surgeon: Meryl Dare, MD;  Location: [facility];  Service: Endoscopy;  Laterality: N/A;   TOTAL KNEE ARTHROPLASTY Left 11/03/2015   TOTAL KNEE ARTHROPLASTY Left 11/03/2015   Procedure: Left Total Knee Arthroplasty;  Surgeon: Valeria Batman, MD;  Location: [facility] OR;  Service: Orthopedics;  Laterality: Left;   TOTAL KNEE ARTHROPLASTY Right 07/17/2018   Procedure: RIGHT TOTAL KNEE ARTHROPLASTY;  Surgeon: Valeria Batman, MD;  Location: [facility] OR;  Service: Orthopedics;  Laterality: Right;   VIDEO BRONCHOSCOPY Bilateral 01/31/2013   Procedure: VIDEO BRONCHOSCOPY WITHOUT FLUORO;  Surgeon: Merwyn Katos, MD;  Location: [facility] ENDOSCOPY;  Service: Cardiopulmonary;  Laterality: Bilateral;    Allergies  Allergen Reactions   Ofev [Nintedanib] Hypertension    Immunization History  Administered Date(s) Administered   Fluad Quad(high Dose 65+) 04/20/2021   Influenza, High Dose Seasonal PF 04/22/2019   Influenza-Unspecified 05/10/2022   PPD Test 04/06/2010   Tdap 05/30/2017    Family History  Problem Relation Age of Onset   Colon cancer Neg Hx    Stomach cancer Neg Hx    Esophageal cancer Neg Hx    Colon polyps Neg Hx    Rectal cancer Neg Hx      Current Outpatient Medications:    aspirin (ASPIRIN CHILDRENS) 81 MG chewable tablet, Chew 1 tablet (81 mg total) by mouth daily., Disp: , Rfl:    losartan (COZAAR) 50 MG tablet, TAKE 1 TABLET(50 MG TOTAL) BY MOUTH DAILY. GENERIC EQUIVALENT FOR COZAAR., Disp: 90 tablet, Rfl: 1   methotrexate 2.5 MG tablet, Take 3 tablets by mouth once a week., Disp: , Rfl:    multivitamin-iron-minerals-folic acid (CENTRUM) chewable tablet, Chew 1 tablet by mouth daily., Disp: , Rfl:    omeprazole (PRILOSEC) 20 MG capsule, TAKE 1 CAPSULE(20 MG) BY MOUTH DAILY, Disp: 90 capsule, Rfl: 1   Pirfenidone 801 MG TABS,  Take 1 tablet (801 mg total) by mouth with breakfast, with lunch, and with evening meal., Disp: 810 tablet, Rfl: 1   rosuvastatin (CRESTOR) 5 MG tablet, TAKE 1 TABLET BY MOUTH EVERY DAY, Disp: 90 tablet, Rfl: 2   Triamcinolone Acetonide (TRIAMCINOLONE 0.1 % CREAM : EUCERIN) CREA, Apply 1 application topically 2 (two) times daily., Disp: 1 each, Rfl: 0  Objective:   Vitals:   04/11/23 1331  BP: 100/60  Pulse: 77  SpO2: 97%  Weight: 152 lb 6.4 oz (69.1 kg)  Height: 5\' 7"  (1.702 m)    Estimated body mass index is 23.87 kg/m as calculated from the following:   Height as of this encounter: 5\' 7"  (1.702 m).   Weight as of this encounter: 152 lb 6.4 oz (69.1 kg).  @WEIGHTCHANGE @  [employer]   04/11/23 1331  Weight: 152 lb 6.4 oz (69.1 kg)     Physical Exam   General: No distress. Looks well O2 at rest: no Cane present: no Sitting in wheel chair: no Frail: no Obese: no Neuro: Alert and Oriented x 3. GCS 15. Speech normal Psych: Pleasant Resp:  Barrel Chest - no.  Wheeze - no, Crackles - YES BASE, No overt respiratory distress CVS: Normal heart sounds. Murmurs - no Ext: Stigmata of Connective Tissue Disease - no HEENT: Normal upper airway. PEERL +. No post nasal drip        Assessment:       ICD-10-CM   1. IPF (idiopathic pulmonary fibrosis) (HCC)  J84.112     2. Encounter for medication counseling  Z71.89     3. Travel advice encounter  Z71.84     4. Vaccine counseling  Z71.85     5. Chronic cough  R05.3          Plan:     Patient Instructions  IPF (idiopathic pulmonary fibrosis) (HCC) Encounter therapeutic monitoring   -slightly progressive dec 2020 -> may 2022 - > stable since starting esbriet summer 2022 -> through feb 2024 -> slightly maybe progressive again as of 02/03/2023 and 04/11/2023 VERSUS Stable   - glad tolerating esbriet well   Plan -check LFT 04/11/2023 - continue esbriet  for IPF - do rpeat spirometry and dlco April 2025 after  [place] trip - do HRCT in April 2025 after [place] trip - appreciate interest in research as a care option but cannot do in 2024 due to travel - use power breathe trainer for developing muscle strength   Chronic cough - due to IPF  Plan - tessalone cough perles 200mg  tid prn x 30 days with 1 refill  Travel Advice to [place]  Plan for future use in [place] - take [employer] for potential future use - Take doxycycline 100mg  po twice daily x 5 days; take after meals and avoid sunlight - Please take prednisone 40 mg x1 day, then 30 mg x1 day, then 20 mg x1 day, then 10 mg x1 day, and then 5 mg x1 day and stop   - avoid sick people and mask during travels - ensue uptodate with flu, RSV and pneumonia vaccines  - our records indicate you are NOT  - give CMA update on these 04/11/2023    Follow-up -  - April 2025 months  but after PFT and HRCT testing  - 30 min with Dr Marchelle Gearing  = symptoms score and walk test at followup  - set up video visit in 3 months from [place]   Return in about 9 weeks (around 04/07/2023) for 30 min visit, after Cleda Daub and DLCO, ILD, with Dr Marchelle Gearing, Face to Face Visit.   FOLLOWUP Return for 3 months video vsiit from [place] and then April 2025 30 min visit after HRCT and PFT for IPF.    SIGNATURE    Dr. Kalman Shan, M.D., F.C.C.P,  Pulmonary and Critical Care Medicine Staff Physician, [facility]  Center Director - Interstitial Lung Disease  Program  Pulmonary Fibrosis [facility] [place], [place], [address]  Pager: [phone], If no answer or between  15:00h - 7:00h: call [phone] Telephone: [phone]  2:00 PM 04/11/2023

## 2023-04-11 NOTE — Progress Notes (Signed)
Performed Spirometry and DLCO Today.  ?

## 2023-04-11 NOTE — Patient Instructions (Addendum)
IPF (idiopathic pulmonary fibrosis) (HCC) Encounter therapeutic monitoring   -slightly progressive dec 2020 -> may 2022 - > stable since starting esbriet summer 2022 -> through feb 2024 -> slightly maybe progressive again as of 02/03/2023 and 04/11/2023 VERSUS Stable   - glad tolerating esbriet well   Plan -check LFT 04/11/2023 - continue esbriet  for IPF - do rpeat spirometry and dlco April 2025 after [place] trip - do HRCT in April 2025 after [place] trip - appreciate interest in research as a care option but cannot do in 2024 due to travel - use power breathe trainer for developing muscle strength   Chronic cough - due to IPF  Plan - tessalone cough perles 200mg  tid prn x 30 days with 1 refill  Travel Advice to [place]  Plan for future use in [place] - take [employer] for potential future use - Take doxycycline 100mg  po twice daily x 5 days; take after meals and avoid sunlight - Please take prednisone 40 mg x1 day, then 30 mg x1 day, then 20 mg x1 day, then 10 mg x1 day, and then 5 mg x1 day and stop   - avoid sick people and mask during travels - ensue uptodate with flu, RSV and pneumonia vaccines  - our records indicate you are NOT  - give CMA update on these 04/11/2023    Follow-up -  - April 2025 months  but after PFT and HRCT testing  - 30 min with Dr Marchelle Gearing  = symptoms score and walk test at followup  - set up video visit in 3 months from [place]   Return in about 9 weeks (around 04/07/2023) for 30 min visit, after Cleda Daub and DLCO, ILD, with Dr Marchelle Gearing, Face to Face Visit.

## 2023-04-12 ENCOUNTER — Telehealth: Payer: Self-pay | Admitting: Internal Medicine

## 2023-04-12 NOTE — Telephone Encounter (Signed)
Patient states pharmacy needs information for the Paxlovid. Patient traveling today at 11:00 am. Pharmacy is [facility]. Patient phone number is [phone]. Sending back as urgent.

## 2023-04-12 NOTE — Telephone Encounter (Signed)
Standard pack paxlovid. He has normal renal function Also, I am on night float and do NOT have capciaty to answer immediately  PAXLOVID   Paxlovid (nirmatelvir 300/Ritonavir100) - BID x 5 days

## 2023-04-12 NOTE — Telephone Encounter (Signed)
Please clarify directions on paxlovid rx

## 2023-04-13 ENCOUNTER — Encounter: Payer: Self-pay | Admitting: Internal Medicine

## 2023-04-18 ENCOUNTER — Telehealth: Payer: Self-pay | Admitting: Pharmacist

## 2023-04-18 NOTE — Telephone Encounter (Signed)
Received fax from [place] stating patient is approved to continue receiving Esbriet free of charge from patient assistance program through end of 2025. Eligibility will be assessed annually  Phone: [phone]  Chesley Mires, PharmD, MPH, BCPS, CPP Clinical Pharmacist (Rheumatology and Pulmonology)

## 2023-07-10 NOTE — Telephone Encounter (Signed)
Received fax from [place] dates 07/04/23 . Patient is actually no longer eligible for Esbriet through [place] in 2025. There was an /updated audit on his case completed in December, and patient is actually no longer eligible to receive free of charge after 08/02/2023  Letter was sent to patient's emial  Chesley Mires, PharmD, MPH, BCPS, CPP Clinical Pharmacist (Rheumatology and Pulmonology)

## 2023-08-15 ENCOUNTER — Telehealth: Payer: Self-pay | Admitting: Pharmacist

## 2023-08-15 NOTE — Telephone Encounter (Signed)
 Patient enrolled into PF grant through [employer]: Award Period: 02/16/2023 - 08/14/2024 Cardholder: [id number] BIN: [id number] PCN: [id number] Group: [id number] For pharmacy inquiries, contact PDMI at [phone].  Please start pirfenidone  BIV (no longer eligible for [facility] PAP_)

## 2023-08-19 NOTE — Telephone Encounter (Signed)
Submitted a Prior Authorization request to [facility] for PIRFENIDONE via CoverMyMeds. Will update once we receive a response.  Key: [id number]

## 2023-08-28 NOTE — Telephone Encounter (Signed)
Received notification from [facility] regarding a prior authorization for PIRFENIDONE. Authorization has been APPROVED from 08/19/23 to 08/18/24. Approval letter sent to scan center.  Patient can fill through [facility]: [phone]   Chesley Mires, PharmD, MPH, BCPS, CPP Clinical Pharmacist (Rheumatology and Pulmonology)

## 2023-09-12 NOTE — Telephone Encounter (Signed)
ATC patient but phone went to VM. MyChart message sent to patient to confirm qty of Esbriet remaining at home  Chesley Mires, PharmD, MPH, BCPS, CPP Clinical Pharmacist (Rheumatology and Pulmonology)

## 2023-11-14 ENCOUNTER — Ambulatory Visit (HOSPITAL_BASED_OUTPATIENT_CLINIC_OR_DEPARTMENT_OTHER)
Admission: RE | Admit: 2023-11-14 | Discharge: 2023-11-14 | Disposition: A | Discharge status: Home / Self Care | Source: Ambulatory Visit | Attending: Internal Medicine | Admitting: Internal Medicine

## 2023-11-14 DIAGNOSIS — J84112 Idiopathic pulmonary fibrosis: Secondary | ICD-10-CM | POA: Diagnosis present

## 2023-11-16 ENCOUNTER — Other Ambulatory Visit: Payer: Self-pay | Admitting: Otolaryngology

## 2023-11-16 DIAGNOSIS — R1310 Dysphagia, unspecified: Secondary | ICD-10-CM

## 2023-11-23 ENCOUNTER — Ambulatory Visit
Admission: RE | Admit: 2023-11-23 | Discharge: 2023-11-23 | Disposition: A | Discharge status: Home / Self Care | Source: Ambulatory Visit | Attending: Otolaryngology | Admitting: Otolaryngology

## 2023-11-23 DIAGNOSIS — R1310 Dysphagia, unspecified: Secondary | ICD-10-CM

## 2023-12-19 ENCOUNTER — Ambulatory Visit: Attending: Otolaryngology | Admitting: Speech Pathology

## 2023-12-19 DIAGNOSIS — R490 Dysphonia: Secondary | ICD-10-CM | POA: Insufficient documentation

## 2023-12-19 NOTE — Therapy (Signed)
 OUTPATIENT SPEECH LANGUAGE PATHOLOGY VOICE EVALUATION   Patient Name: Alan Roman MRN: [id number] DOB:August 13, 1945, 78 y.o., male 27 Date: 12/21/2023  PCP: Dr. Darien Eden REFERRING PROVIDER: Dr. Westley Hammers   END OF SESSION:  SLP Visits / Re-Eval   Visit Number 1  Number of Visits 9  Date for SLP Re-Evaluation 03/12/2024  Authorization   Authorization Type BC[employer]  SLP Time Calculation   SLP Start Time 1230  SLP Stop Time 1315  SLP Time Calculation (min) 45 min  SLP - End of Session   Activity Tolerance Patient tolerated treatment well    Past Medical History:  Diagnosis Date   Acute bronchiolitis due to other infectious organisms 01/26/2013   Arthritis    "knees" (11/03/2015)   Extrapulmonary TB (tuberculosis) 02/02/2013   antibx therapy in 2014   GERD (gastroesophageal reflux disease)    on meds   High risk medication use 12/27/2016   History of hiatal hernia 2007; 2009   Hyperlipidemia    on meds   Hypertension    on meds   Pleural effusion 01/26/2013   Past Surgical History:  Procedure Laterality Date   BIOPSY  04/01/2021   Procedure: BIOPSY;  Surgeon: Asencion Blacksmith, MD;  Location: [facility];  Service: Endoscopy;;   CATARACT EXTRACTION W/ INTRAOCULAR LENS  IMPLANT, BILATERAL Bilateral    ESOPHAGOGASTRODUODENOSCOPY (EGD) WITH PROPOFOL  N/A 04/01/2021   Procedure: ESOPHAGOGASTRODUODENOSCOPY (EGD) WITH PROPOFOL ;  Surgeon: Asencion Blacksmith, MD;  Location: [facility];  Service: Endoscopy;  Laterality: N/A;   HIATAL HERNIA REPAIR  2007; 2009   SAVORY DILATION N/A 04/01/2021   Procedure: SAVORY DILATION;  Surgeon: Asencion Blacksmith, MD;  Location: [facility];  Service: Endoscopy;  Laterality: N/A;   TOTAL KNEE ARTHROPLASTY Left 11/03/2015   TOTAL KNEE ARTHROPLASTY Left 11/03/2015   Procedure: Left Total Knee Arthroplasty;  Surgeon: Shirlee Dotter, MD;  Location: [facility] OR;  Service: Orthopedics;  Laterality: Left;   TOTAL KNEE ARTHROPLASTY Right 07/17/2018    Procedure: RIGHT TOTAL KNEE ARTHROPLASTY;  Surgeon: Shirlee Dotter, MD;  Location: [facility] OR;  Service: Orthopedics;  Laterality: Right;   VIDEO BRONCHOSCOPY Bilateral 01/31/2013   Procedure: VIDEO BRONCHOSCOPY WITHOUT FLUORO;  Surgeon: Valiant Gaul, MD;  Location: [facility] ENDOSCOPY;  Service: Cardiopulmonary;  Laterality: Bilateral;   Patient Active Problem List   Diagnosis Date Noted   Pain in right leg 05/26/2021   Dysphagia    Gastroesophageal reflux disease    Esophageal stricture    At risk for aspiration 08/07/2019   ILD (interstitial lung disease) (HCC) 06/03/2019   Elevated diaphragm 06/03/2019   Dyspnea on exertion 05/06/2019   Vocal cord atrophy 05/06/2019   History of total right knee replacement 07/17/2018   Primary osteoarthritis of right knee 05/31/2018   History of tuberculosis 11/05/2015   Orthostatic hypotension 11/05/2015   Constipation 11/05/2015   Thrombocytopenia (HCC) 11/05/2015   Normocytic anemia 11/05/2015   Psoriatic arthritis (HCC) 11/03/2015   Essential hypertension 11/03/2015   Osteoarthritis of left knee 11/03/2015   Status post total knee replacement, left 11/03/2015   Psoriasis 01/23/2013   Immunocompromised state (HCC) 01/23/2013    Referral date: 11/16/2023   REFERRING DIAG:  R49.0 (ICD-10-CM) - Dysphonia     THERAPY DIAG:  Dysphonia  Rationale for Evaluation and Treatment: Rehabilitation  SUBJECTIVE:   SUBJECTIVE STATEMENT: Pt reports voice concerns ongoing many years. Stable since onset.  Pt accompanied by: significant other  PERTINENT HISTORY: Per ENT documentation, 11/15/2023: He reports a sudden onset of hoarseness,  which has been persistent for the past 3 to 4 months. He does not experience any associated pain during speech or swallowing. His appetite remains unaffected, although he occasionally experiences difficulty swallowing certain foods, particularly dry fruits as well as pills. He does not consume meat and has no issues with  liquid intake. He maintains adequate hydration and reports no weight loss. He was previously seen by laryngology in [facility] and underwent bilateral injection laryngoplasty with Dr. Leonette Ramal for vocal fold paresis. He also was diagnosed with muscle tension dysphonia and underwent speech therapy, with some improvement in symptoms. He has not undergone a swallow study.  He has acid reflux and takes medication intermittently. He is currently on Esbriet  for pulmonary fibrosis and is under the care of a pulmonologist. He does not smoke or drink alcohol .   Impression/Plan: history of muscle tension dysphonia, bilateral true vocal fold paresis, status post bilateral injection laryngoplasty with laryngology in 2016 presenting for evaluation of hoarseness and dysphagia to dried foods and pills. Laryngoscopy performed today with findings as above. I recommend referral to speech-language pathology for consideration of voice therapy, as well as proceeding with barium swallow study to evaluate patient's dysphagia.   PAIN:  Are you having pain? No  FALLS: Has patient fallen in last 6 months? No, Number of falls: 0  LIVING ENVIRONMENT: Lives with: lives with their spouse Lives in: House/apartment  PLOF:Level of assistance: Independent  Employment: Retired  PATIENT GOALS: improved voice, reduced fatigue   OBJECTIVE:  Note: Objective measures were completed at Evaluation unless otherwise noted.  DIAGNOSTIC FINDINGS: Laryngoscopy 11/15/2023 Flexible laryngoscopy shows patent anterior nasal cavity with minimal crusting, no discharge or infection.  Mild to moderate interarytenoid edema and erythema consistent with circumferential reflux, otherwise normal base of tongue and supraglottis Normal vocal cord mobility. Significant supraglottic compression with phonation, consistent with muscle tension dysphonia. Unable to visualize complete adduction of true vocal fold secondary to false cord compression.  Hypopharynx normal without mass, pooling of secretions or aspiration.   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 3 How often do you do the following?  Shout or scream: never Talk loudly: rarely Talk a lot: often Talk over noise: rarely  Use the phone: quite a bit Sing: never  Surgical history: VF injection (2016) without reported benefit   PHYSICAL SYMPTOMS: Do you have any burning, soreness, tickling, or irritation in your throat? No Do you sometimes have a sensation of a lump in your throat?  No Do you have any aching or tightness in your throat? No Do you ever feel tension in your neck area? No Does your voice get tired easily? Yes ( when speaking for extended time)  Do you feel as if you have to strain to produce voice? No Do you feel as if you need to cough or clear your throat a lot? Yes Do you ever lose your voice completely? No Do you ever have difficulty swallowing? No (exception, gel tablets) Do you have difficulty projecting your voice? No  VOCAL ABUSE:  Episodes observed during evaluation: throat clearing, coughing  Characteristics observed during evaluation: throat clearing x5, coughing x4 Pt report of frequency: daily Pt report of duration: "years"  Identified triggers: Acid  reflux , Postnasal drip, and Voice overuse  PERCEPTUAL VOICE ASSESSMENT: Voice quality: hoarse, breathy, and vocal fatigue Vocal abuse: habitual throat clearing Resonance: normal Respiratory function: thoracic breathing  OBJECTIVE VOICE ASSESSMENT: Maximum phonation time for sustained "ah": 6 seconds  Conversational pitch average: WFL Conversational loudness average: 74 dB S/z ratio:  1.14 (Suggestive of dysfunction >1.0)  STIMULIBILITY TRIALS FOR IMPROVED VOCAL QUALITY: Oral reading, phrase level: mild improvement in volume and speech clarity. Ongoing evidence of reduced breath support in insufficient effort for sustained vocal quality improvement over phrase length.   Pt does not report difficulty with swallowing, which does not warrant further evaluation  PATIENT REPORTED OUTCOME MEASURES (PROM): Voice Related Quality of Life Measure Pt was asked to rate on a scale of 1-5 how much of a problem different speaking situations have been over the past two weeks     Because of my voice...  Pt Rating  I have trouble speaking loudly or being heard in noisy situations 3  I run out of air and need to take frequent breaths when talking 4  I sometimes do not know what will come out when I begin speaking 3  I am sometimes anxious or frustrated because of my voice 4  I sometimes get depressed because of my voice 3  I have trouble using the telephone because of my voice 3  I have trouble doing my job or practicing my profession because of my voice (modification to social activities) 3  I avoid going out socially because of my voice 2  I have to repeat myself to be understood 3  I have become less outgoing because of my voice 2  1= none, not a problem 5= problem is as "bad as it can be"  Calculated score of 50 (>80 is considered WNL)   TODAY'S TREATMENT:                                                                                                                   12/19/23: Initiated education regarding pathophysiology of voice complaints and presentation. Discussed vocal fold atrophy and ventricular phonation resulting in decreased voice quality. Education provided regarding role of breath support on speech. Initiated training for "clear" speech. Modeled loud, over-articulation, pacing.   PATIENT EDUCATION: Education details: see above Person educated: Patient, Spouse, and  Child(ren) Education method: Medical illustrator Education comprehension: verbalized understanding, returned demonstration, and needs further education  HOME EXERCISE PROGRAM: Bring pulm tools next session  GOALS: Goals reviewed with patient? Yes  SHORT TERM GOALS: Target date: 02/13/2024  Pt will teach back vocal hygiene recommendations with mod-I Baseline: Goal status: INITIAL  2.  Pt will demonstrate accuracy during voice exercises targeting vocal fold atrophy and muscle tension dysphonia with modeling and cues from SLP  Baseline:  Goal status: INITIAL  3.  Pt will accurately implement respiratory muscle strength training (inspiratory, expiratory) to optimize breath support for speech  Baseline: has breather device from prior ST course  Goal status: INITIAL  4.  Pt will demonstrate throat clear or cough alternative in 50% of opportunities over 2 sessions  Baseline:  Goal status: INITIAL    LONG TERM GOALS: Target date: 03/12/2024  Pt will demonstrate carryover of targeted techniques and strategies into 10 minute conversation with improved vocal quality  Baseline:  Goal status: INITIAL  2.  Pt will report ongoing implementation of vocal hygiene techniques over 2 week period  Baseline:  Goal status: INITIAL  3.  Pt will report improvement via PROM by d/c Baseline: Calculated score of 50 at evaluation  Goal status: INITIAL   ASSESSMENT:  CLINICAL IMPRESSION: Patient is a 78 y.o. M who was seen today for voice evaluation. Evaluation reveals moderate dysphonia. Pt's voice is c/b hoarse, breathy vocal quality with reduced breath support for speech. Pt's presentation is c/w vocal fold atrophy and muscle tension dysphonia. Pt demonstrates fair stimulibility during phrase level reading task for improved vocal quality. In addition to dysphonia, pt also evidences chronic cough and throat clearing. Reports ongoing many years. Druing today's evaluation, pt appeared to  have throat clear or cough response related to voice use (e.g., following extended spoken response or during increased effort when speaking).  I recommend skilled ST to address dysphonia to enhance communication efficacy and QoL.   OBJECTIVE IMPAIRMENTS: include voice disorder. These impairments are limiting patient from effectively communicating at home and in community. Factors affecting potential to achieve goals and functional outcome are time since onset, severity of condition, etiology. Patient will benefit from skilled SLP services to address above impairments and improve overall function.  REHAB POTENTIAL: Good  PLAN:  SLP FREQUENCY: 2x/week  SLP DURATION: 12 weeks  PLANNED INTERVENTIONS: 92507 Treatment of speech (30 or 45 min)  and 65784- Speech Eval Behavioral Qualitative Voice Resonance  Alston Jerry, CCC-SLP 12/21/2023, 7:39 AM

## 2023-12-21 ENCOUNTER — Ambulatory Visit: Payer: Medicare Other | Admitting: Cardiology

## 2024-01-09 ENCOUNTER — Encounter: Payer: Self-pay | Admitting: Cardiology

## 2024-01-09 ENCOUNTER — Ambulatory Visit: Attending: Cardiology | Admitting: Cardiology

## 2024-01-09 VITALS — BP 108/64 | Ht 67.0 in | Wt 151.0 lb

## 2024-01-09 DIAGNOSIS — I2510 Atherosclerotic heart disease of native coronary artery without angina pectoris: Secondary | ICD-10-CM | POA: Diagnosis not present

## 2024-01-09 DIAGNOSIS — K219 Gastro-esophageal reflux disease without esophagitis: Secondary | ICD-10-CM | POA: Diagnosis not present

## 2024-01-09 DIAGNOSIS — E7800 Pure hypercholesterolemia, unspecified: Secondary | ICD-10-CM

## 2024-01-09 DIAGNOSIS — I10 Essential (primary) hypertension: Secondary | ICD-10-CM | POA: Diagnosis not present

## 2024-01-09 MED ORDER — PANTOPRAZOLE SODIUM 20 MG PO TBEC: 20.0000 mg | DELAYED_RELEASE_TABLET | Freq: Every day | ORAL | 3 refills | Status: AC | PRN

## 2024-01-09 NOTE — Patient Instructions (Signed)
 Medication Instructions:  Your physician recommends that you continue on your current medications as directed. Please refer to the Current Medication list given to you today.  *If you need a refill on your cardiac medications before your next appointment, please call your pharmacy*  Lab Work: none If you have labs (blood work) drawn today and your tests are completely normal, you will receive your results only by: MyChart Message (if you have MyChart) OR A paper copy in the mail If you have any lab test that is abnormal or we need to change your treatment, we will call you to review the results.  Testing/Procedures: none  Follow-Up: At [facility], you and your health needs are our priority.  As part of our continuing mission to provide you with exceptional heart care, our providers are all part of one team.  This team includes your primary Cardiologist (physician) and Advanced Practice Providers or APPs (Physician Assistants and Nurse Practitioners) who all work together to provide you with the care you need, when you need it.  Your next appointment:   12 month(s)  Provider:   Knox Perl, MD    We recommend signing up for the patient portal called "MyChart".  Sign up information is provided on this After Visit Summary.  MyChart is used to connect with patients for Virtual Visits (Telemedicine).  Patients are able to view lab/test results, encounter notes, upcoming appointments, etc.  Non-urgent messages can be sent to your provider as well.   To learn more about what you can do with MyChart, go to [email or web address].

## 2024-01-09 NOTE — Progress Notes (Signed)
 Cardiology Office Note:  .   Date:  01/09/2024  ID:  Alan Roman, DOB 06-Jan-1946, MRN [id number] PCP: Orlena Bitters, MD  [facility] Providers Cardiologist:  Knox Perl, MD   History of Present Illness: .   Alan Roman is a 78 y.o. Asian [place] male with history of hypertension, psoriatic arthritis and follows Dr. Nicholas Bari, very mild hyperlipidemia and coronary calcificaiton by CT chest, IPF.  Due to high-resolution CT scan done on 11/23/2020 revealing left main and two-vessel coronary calcification and aortic atherosclerosis, in view of underlying dyspnea, he underwent echocardiogram and stress test.   Echocardiogram revealed mild to moderate TR, normal LVEF and otherwise no significant abnormalities. Stress test was without ischemia and overall low risk in 2022.  Echocardiogram on 12/15/2022 revealing normal LVEF at 55 to 60% with mild aortic regurgitation and mild TR without evidence of pulm hypertension.  Discussed the use of AI scribe software for clinical note transcription with the patient, who gave verbal consent to proceed.  History of Present Illness Alan Roman "Alan Roman" is a 78 year old male with hypertension and coronary calcification on the CT, IPF who presents for routine follow-up.  His blood pressure is well controlled at 108/64 mmHg with losartan  50 mg daily. He takes rosuvastatin  5 mg daily, and his most recent LDL was 68 mg/dL. He also takes a baby aspirin  daily. He experiences occasional heartburn, managed with pantoprazole 20 mg as needed. Heartburn sometimes leads to coughing, requests a refill.  Labs   Lab Results  Component Value Date   NA 138 10/06/2022   K 4.1 10/06/2022   CO2 28 10/06/2022   GLUCOSE 107 (H) 10/06/2022   BUN 20 10/06/2022   CREATININE 1.27 10/06/2022   CALCIUM  9.0 10/06/2022   GFR 54.30 (L) 10/06/2022   GFRNONAA >60 05/21/2020      Latest Ref Rng & Units 10/06/2022    3:53 PM 05/17/2022    9:02  AM 12/29/2020    2:02 PM  BMP  Glucose 70 - 99 mg/dL 191  95  478   BUN 6 - 23 mg/dL 20  15  17    Creatinine 0.40 - 1.50 mg/dL 2.95  6.21  3.08   Sodium 135 - 145 mEq/L 138  136  139   Potassium 3.5 - 5.1 mEq/L 4.1  4.3  4.1   Chloride 96 - 112 mEq/L 104  102  105   CO2 19 - 32 mEq/L 28  29  27    Calcium  8.4 - 10.5 mg/dL 9.0  9.2  8.6       Latest Ref Rng & Units 10/06/2022    3:53 PM 05/17/2022    9:02 AM 12/29/2020    2:02 PM  CBC  WBC 4.0 - 10.5 K/uL 5.9  5.5  5.6   Hemoglobin 13.0 - 17.0 g/dL 65.7  84.6  96.2   Hematocrit 39.0 - 52.0 % 40.5  39.9  38.9   Platelets 150.0 - 400.0 K/uL 209.0  156.0  156.0    External Labs:  Care everywhere labs 04/06/2023:  Total cholesterol 142, triglycerides 82, HDL 58, LDL 68.  TSH normal at 1.87.  Hb 14.5/HCT 43.3, platelets 182.  LFTs were normal.  Serum glucose 85 mg, BUN 14, creatinine 1.12, EGFR 67 mL.  BNP 10/06/2022: 41, normal.  ROS  Review of Systems  Cardiovascular:  Positive for dyspnea on exertion (chronic and stable). Negative for chest pain and leg swelling.   Physical Exam:   VS:  BP 108/64 (BP Location: Left Arm)   Ht 5\' 7"  (1.702 m)   Wt 151 lb (68.5 kg)   SpO2 98%   BMI 23.65 kg/m    Wt Readings from Last 3 Encounters:  01/09/24 151 lb (68.5 kg)  04/11/23 152 lb 6.4 oz (69.1 kg)  04/11/23 152 lb 6.4 oz (69.1 kg)    Physical Exam Neck:     Vascular: No carotid bruit or JVD.  Cardiovascular:     Rate and Rhythm: Normal rate and regular rhythm.     Pulses: Intact distal pulses.     Heart sounds: Normal heart sounds. No murmur heard.    No gallop.  Pulmonary:     Effort: Pulmonary effort is normal.     Breath sounds: Examination of the right-middle field reveals rales. Examination of the right-lower field reveals rales. Examination of the left-lower field reveals rales. Rales present.  Abdominal:     General: Bowel sounds are normal.     Palpations: Abdomen is soft.  Musculoskeletal:     Right lower leg:  No edema.     Left lower leg: No edema.    Studies Reviewed: Aaron Aas     EKG:    EKG Interpretation Date/Time:  Tuesday January 09 2024 09:42:37 EDT Ventricular Rate:  67 PR Interval:  136 QRS Duration:  76 QT Interval:  418 QTC Calculation: 441 R Axis:   -8  Text Interpretation: EKG 01/09/2024: Normal sinus rhythm at rate of 67 bpm, cannot exclude inferior infarct old.  Low-voltage chest leads.  Compared to 07/04/2018, no significant change. Confirmed by Virga Haltiwanger, Jagadeesh ([id number]) on 01/09/2024 10:03:04 AM    Medications and allergies    Allergies  Allergen Reactions   Ofev  [Nintedanib] Hypertension     Current Outpatient Medications:    aspirin  (ASPIRIN  CHILDRENS) 81 MG chewable tablet, Chew 1 tablet (81 mg total) by mouth daily., Disp: , Rfl:    Ferrous Fumarate (HEMOCYTE - 106 MG FE) 324 (106 Fe) MG TABS tablet, Take by mouth., Disp: , Rfl:    losartan  (COZAAR ) 50 MG tablet, TAKE 1 TABLET(50 MG TOTAL) BY MOUTH DAILY. GENERIC EQUIVALENT FOR COZAAR ., Disp: 90 tablet, Rfl: 1   methotrexate  2.5 MG tablet, Take 3 tablets by mouth once a week., Disp: , Rfl:    multivitamin-iron-minerals-folic acid  (CENTRUM) chewable tablet, Chew 1 tablet by mouth daily., Disp: , Rfl:    pantoprazole (PROTONIX) 20 MG tablet, Take 1 tablet (20 mg total) by mouth daily as needed., Disp: 90 tablet, Rfl: 3   Pirfenidone  801 MG TABS, Take 1 tablet (801 mg total) by mouth with breakfast, with lunch, and with evening meal., Disp: 810 tablet, Rfl: 1   rosuvastatin  (CRESTOR ) 5 MG tablet, TAKE 1 TABLET BY MOUTH EVERY DAY, Disp: 90 tablet, Rfl: 2   Triamcinolone  Acetonide (TRIAMCINOLONE  0.1 % CREAM : EUCERIN) CREA, Apply 1 application topically 2 (two) times daily., Disp: 1 each, Rfl: 0   Meds ordered this encounter  Medications   pantoprazole (PROTONIX) 20 MG tablet    Sig: Take 1 tablet (20 mg total) by mouth daily as needed.    Dispense:  90 tablet    Refill:  3     Medications Discontinued During This  Encounter  Medication Reason   nirmatrelvir  & ritonavir  (PAXLOVID , 150/100,) 10 x 150 MG & 10 x 100MG  TBPK Completed Course   doxycycline  (VIBRA -TABS) 100 MG tablet Completed Course   benzonatate  (TESSALON ) 200 MG capsule No longer needed (for PRN medications)  omeprazole  (PRILOSEC) 20 MG capsule Change in therapy     ASSESSMENT AND PLAN: .      ICD-10-CM   1. Essential hypertension  I10 EKG 12-Lead    2. Coronary artery calcification seen on CAT scan  I25.10 EKG 12-Lead    3. Mild hypercholesterolemia  E78.00     4. Gastroesophageal reflux disease without esophagitis  K21.9 pantoprazole (PROTONIX) 20 MG tablet      Assessment and Plan Assessment & Plan Coronary calcification noted on the CT scan Mild plaque buildup in coronary arteries. Cholesterol levels are well controlled with rosuvastatin  5 mg daily, and LDL is at a desirable level of 68 mg/dL. He has been well-managed for the past ten years.  - Continue rosuvastatin  5 mg daily. - Continue baby aspirin , but it can be stopped temporarily if heartburn occurs.  Hypertension Hypertension is well controlled with losartan . Current blood pressure is 108/64 mmHg. - Continue losartan  50 mg daily.  Acid reflux Intermittent acid reflux with associated coughing. Symptoms are managed with pantoprazole as needed. No need for daily medication unless symptoms persist. If heartburn becomes frequent, a referral to a gastroenterologist is recommended. - Prescribe pantoprazole 20 mg, one pill once a day as needed for heartburn, to be taken for 4-5 days and then stopped. - Avoid aspirin  during exacerbations of GERD.   Stable from cardiac standpoint I will see him back on a as needed basis.  Signed,  Knox Perl, MD, [facility] 01/09/2024, 6:15 PM [facility] [address] [place], [place] [address] Phone: [phone]. Fax:  [phone]

## 2024-01-11 ENCOUNTER — Telehealth: Payer: Self-pay | Admitting: Internal Medicine

## 2024-01-11 ENCOUNTER — Ambulatory Visit: Admitting: Internal Medicine

## 2024-01-11 ENCOUNTER — Encounter: Payer: Self-pay | Admitting: Internal Medicine

## 2024-01-11 VITALS — BP 119/70 | HR 69 | Ht 67.0 in | Wt 152.0 lb

## 2024-01-11 DIAGNOSIS — J84112 Idiopathic pulmonary fibrosis: Secondary | ICD-10-CM | POA: Diagnosis not present

## 2024-01-11 DIAGNOSIS — Z7189 Other specified counseling: Secondary | ICD-10-CM

## 2024-01-11 LAB — CBC WITH DIFFERENTIAL/PLATELET
Absolute Lymphocytes: 1532 {cells}/uL (ref 850–3900)
Absolute Monocytes: 541 {cells}/uL (ref 200–950)
Basophils Absolute: 32 {cells}/uL (ref 0–200)
Basophils Relative: 0.6 %
Eosinophils Absolute: 180 {cells}/uL (ref 15–500)
Eosinophils Relative: 3.4 %
HCT: 38.2 % — ABNORMAL LOW (ref 38.5–50.0)
Hemoglobin: 13 g/dL — ABNORMAL LOW (ref 13.2–17.1)
MCH: 33.4 pg — ABNORMAL HIGH (ref 27.0–33.0)
MCHC: 34 g/dL (ref 32.0–36.0)
MCV: 98.2 fL (ref 80.0–100.0)
MPV: 10.3 fL (ref 7.5–12.5)
Monocytes Relative: 10.2 %
Neutro Abs: 3016 {cells}/uL (ref 1500–7800)
Neutrophils Relative %: 56.9 %
Platelets: 162 10*3/uL (ref 140–400)
RBC: 3.89 10*6/uL — ABNORMAL LOW (ref 4.20–5.80)
RDW: 12.8 % (ref 11.0–15.0)
Total Lymphocyte: 28.9 %
WBC: 5.3 10*3/uL (ref 3.8–10.8)

## 2024-01-11 LAB — COMPREHENSIVE METABOLIC PANEL WITH GFR
AG Ratio: 1.1 (calc) (ref 1.0–2.5)
ALT: 24 U/L (ref 9–46)
AST: 35 U/L (ref 10–35)
Albumin: 3.9 g/dL (ref 3.6–5.1)
Alkaline phosphatase (APISO): 99 U/L (ref 35–144)
BUN: 17 mg/dL (ref 7–25)
CO2: 24 mmol/L (ref 20–32)
Calcium: 9 mg/dL (ref 8.6–10.3)
Chloride: 104 mmol/L (ref 98–110)
Creat: 1.14 mg/dL (ref 0.70–1.28)
Globulin: 3.4 g/dL (ref 1.9–3.7)
Glucose, Bld: 88 mg/dL (ref 65–99)
Potassium: 4.3 mmol/L (ref 3.5–5.3)
Sodium: 136 mmol/L (ref 135–146)
Total Bilirubin: 0.5 mg/dL (ref 0.2–1.2)
Total Protein: 7.3 g/dL (ref 6.1–8.1)
eGFR: 65 mL/min/{1.73_m2} (ref >=60)

## 2024-01-11 LAB — HEPATIC FUNCTION PANEL
AG Ratio: 1.1 (calc) (ref 1.0–2.5)
ALT: 24 U/L (ref 9–46)
AST: 35 U/L (ref 10–35)
Albumin: 3.9 g/dL (ref 3.6–5.1)
Alkaline phosphatase (APISO): 99 U/L (ref 35–144)
Bilirubin, Direct: 0.1 mg/dL (ref 0.0–0.2)
Globulin: 3.4 g/dL (ref 1.9–3.7)
Indirect Bilirubin: 0.4 mg/dL (ref 0.2–1.2)
Total Bilirubin: 0.5 mg/dL (ref 0.2–1.2)
Total Protein: 7.3 g/dL (ref 6.1–8.1)

## 2024-01-11 MED ORDER — PIRFENIDONE 801 MG PO TABS
801.0000 mg | ORAL_TABLET | Freq: Three times a day (TID) | ORAL | 1 refills | Status: AC
  Filled 2024-01-31: qty 90, 30d supply, fill #0
  Filled 2024-02-22 – 2024-03-04 (×2): qty 90, 30d supply, fill #1
  Filled 2024-04-12: qty 270, 90d supply, fill #2
  Filled 2024-07-04 (×2): qty 90, 30d supply, fill #3
  Filled 2024-07-30 – 2024-09-02 (×3): qty 90, 30d supply, fill #4

## 2024-01-11 NOTE — Telephone Encounter (Signed)
 Pls let him know . We agave a donor sample. He can also buy cheaper in [place]

## 2024-01-11 NOTE — Patient Instructions (Addendum)
 IPF (idiopathic pulmonary fibrosis) (HCC) Encounter therapeutic monitoring   -slightly progressive dec 2020 -> may 2022 - > stable since starting esbriet  summer 2022 ->  STable on CT chest April 2025  - glad tolerating esbriet  well   Plan -check LFT, CBC, bmet 01/11/2024 - continue esbriet   for IPF - do rpeat spirometry and dlco  Sept   2025 after [place] trip - - appreciate interest in research as a care option but cannot do in 2025 due to travel - use power breathe trainer for developing muscle strength   Chronic cough - due to IPF  Plan - tessalone cough perles 200mg  tid prn x 30 days with 1 refill     Follow-up -  - Aug/Sept 2025 months  but after PFT   - 15 min with Dr Bertrum Brodie  = symptoms score and sit stand test at followup

## 2024-01-11 NOTE — Telephone Encounter (Signed)
 Refill for pirfenidone  pending updated LFTs

## 2024-01-11 NOTE — Telephone Encounter (Signed)
 Alan   Mir Eynon Roman is concerned his blood thinner to run out.  He needs renewal.  Please facilitate.

## 2024-01-11 NOTE — Telephone Encounter (Signed)
 Patient no longer eligible for patient assistance for Esbriet . Was enrolled into a grant earlier this year with lack of response from patient. Hopefully grant is still able to get billed as this point

## 2024-01-11 NOTE — Progress Notes (Signed)
 NOV 2020 with Dr [name]  78 year old never smoker of [place] origin initial consult 05/2019  for evaluation of hoarseness, dry cough and congestion. He is originally from [place], [place], [place]-immigrated to the [place]  in [place] for his bachelors to [place] and then settled in [place] in 1978 and got into [employer] business.  PMH - - psoriatic arthritis by Dr. Gomez Lathe, was originally on Humira and is now on Cosentyx . - latent TB that was untreated initially and in 12/2012 he presented with a mild alveolitis on CT scan and pleural effusion that required thoracentesis, bronchoscopy was negative for AFB but he was treated under the guidance of local health department and ID with multiple drug antituberculous treatment for 3 months.  His problem lists extrapulmonary TB but on my review all AFB cultures were negative.   -07/2018 chest x-ray shows eventration of right hemidiaphragm.   - vocal cord paralysis in 04/2015 and underwent ENT evaluation for persistent dysphonia and underwent gel injection of the vocal cords at [facility] without any relief  Chief Complaint  Patient presents with   Follow-up    1 month rov- review CT chest 10/31.  pt states his s/s are unchanged since last month, prod cough with clear mucus qam that subsides throughout the day.      Symptoms are persistent. Main issues are hoarseness, dry cough and congestion  We reviewed HRCT in detail today  CT angiogram 10/2015 -which shows mild reticular densities suggestive but not diagnostic of ILD.  HRCT 05/31/19 mild, bibasilar predominant pulmonary fibrosis in a pattern featuring subtle subpleural reticular interstitial opacity, mild traction bronchiectasis and occasional areas of Bronchiolectasis - ' probable UIP ' - slight worse   OV Alan Roman Jan 2021  78 year old male never smoker presenting to our office today after completing pulmonary function testing.  Patient was last seen in our office in  October/2020 for initial consult with Dr. [name].  At that office visit patient was scheduled for pulmonary function testing, lab work was completed to further evaluate patient's probable UIP on October/2020 high-resolution CT chest, patient was referred to ear nose and throat, swallow studies were also ordered on the patient.  Patient completed ENT referral in 07/02/2019 with Dr. Raeanne Roman Recommendations for voice therapy, that he follow-up with gastroenterology regarding this Schatzki's ring.  Follow-up in April.  Proceed forward with pulmonary pulmonary function test.  Patient reports that he is received a telephone call to restart voice rehab but he has not done this yet as he wanted to review pulmonary function testing with our office first.  Patient swallow studies are listed below:  06/06/2019-swallow study-mild aspiration risk, no treatment recommended at this time 06/07/2019-barium swallow-Schatzki ring at the GE junction which prevents passage of a 13 mm barium tablet  Patient has a gastroenterologist Dr. Tova Roman but he has not seen her in over a year.  Patient has no scheduled upcoming appointments.  Patient continues to follow-up with rheumatology, Dr. Alvira Roman.  Per chart review last office note was in July/2020.  Patient presenting today to review December/2020 pulmonary function test.  Those results are listed below:  07/19/2019-pulmonary function test-FVC 2.32 (63% predicted), postbronchodilator ratio 93, postbronchodilator FEV1 1.93 (69% predicted), DLCO 13.59 (43% predicted) >>>Severe restriction, moderately severe diffusion defect  Lab work from November/2020 are also listed below:  06/03/2019-ANA-1: 40, nuclear homogenous 06/03/2019-rheumatoid factor-14 06/03/2019-anti-Jo 1-less than 0.2 06/03/2019-aldolase-4.9 06/03/2019-centromere antibodies-negative 06/03/2019-antiscleroderma antibody-less than 1 - 06/03/2019-Sjogren's syndrome 8 and B-negative 06/03/2019-hypersensitivity  pneumonitis-negative 06/03/2019-sed  rate 22  Mildly elevated ANA and rheumatoid factor.  Felt to be not clinically significant.  Rest of results are negative.  Patient continues to report that he is asymptomatic.  Patient tolerates walk in office without any oxygen desaturations.  He does continue to have occasional bouts of congestion which is believed to be related to the Cosentyx .  Family has messaged rheumatology regarding this.  Tests:   07/19/2019-pulmonary function test-FVC 2.32 (63% predicted), postbronchodilator ratio 93, postbronchodilator FEV1 1.93 (69% predicted), DLCO 13.59 (43% predicted) >>>Severe restriction, moderately severe diffusion defect  07/16/2019-SARS-CoV-2-not detected  06/03/2019-ANA-1: 40, nuclear homogenous 06/03/2019-rheumatoid factor-14 06/03/2019-anti-Jo 1-less than 0.2 06/03/2019-aldolase-4.9 06/03/2019-centromere antibodies-negative 06/03/2019-antiscleroderma antibody-less than 1 - 06/03/2019-Sjogren's syndrome 8 and B-negative 06/03/2019-hypersensitivity pneumonitis-negative 06/03/2019-sed rate 22  06/01/2019-CT chest high-res mild, bibasilar predominant pulmonary fibrosis in a pattern with featuring subtle subpleural reticular interstitial opacity and mild traction bronchiectasis occasional areas of bronchiolectasis, probable UIP  06/06/2019-swallow study-mild aspiration risk, no treatment recommended at this time  06/07/2019-barium swallow-Schatzki ring at the GE junction which prevents passage of a 13 mm barium tablet  FENO:  No results found for: NITRICOXIDE  MH: Psoriatic arthritis (Dr. Alvira Roman - prev on Humira, now on Cosentyx ), latent TB that was untreated initially in 12/2012 with mild alveolitis on CT scan and pleural effusion that required thoracentesis, bronchoscopy was negative for AFB but he was treated under guidance with local health department infectious disease with multidrug antituberculosis treatment for 3 months, eventration of right  hemidiaphragm, vocal cord paralysis in September/2016 underwent ENT evaluation for persistent dysphonia and underwent gel injection of vocal cords at [facility] without any relief Smoker/ Smoking History: Never smoker Maintenance: None   He is originally from [place]-imm[place]rated to [place]  in [place] for his bachelors to [place] and then settled in [place] in 1978 and got into [employer] business.    OV 11/10/2020  Subjective:  Patient ID: Alan Roman, male , DOB: 15-Feb-1946 , age 32 y.o. , MRN: [id number] , ADDRESS: [address] [place] [address] PCP Orlena Bitters, MD Patient Care Team: Orlena Bitters, MD as PCP - General (Internal Medicine)  This Provider for this visit: Treatment Team:  Attending Provider: Maire Scot, MD    11/10/2020 -   Chief Complaint  Patient presents with   Follow-up    Increased coughing for past 3 months, sometimes productive with whitish sputum. Denies shortness of breath.      HPI [facility] 78 y.[facility]. -asked by Dr. Berry Bristol to establish at the pulmonary fibrosis center.  Therefore patient is seeing Dr. Bertrum Brodie.  Patient used to be previously followed by Dr. [name].  Last seen by Dr. Alva in 2020.  Then seen by nurse practitioner Alan Roman in January 2021.   His daughtre Mancel Seashore later joined on the phone call.  Patient is known to have ILD probable UIP pattern.  Is on observation at this point.   [facility] Comprehensive ILD Questionnaire  Symptoms:  -   he reports insidious onset of dyspnea for the last 4 months.  Since it started it is the same.  Although his ILD is present even before that.  He says that he has a very mild cough however in the last 4 to 6 months it is worse.  Particularly when he talks for a long time.  He does not wake up in the middle of the night because of the cough he does bring up some white sputum that is very light white.  Cough  does not affect his voice.  He does clear his  throat.  He has never had hemoptysis.  He does not cough when he lies down.  No nausea vomiting or diarrhea.  No wheezing.  Cough does get worse with eating  Past Medical History : He has psoriatic arthritis.  He is immunosuppressed and is on immunomodulators.  Currently on Simponi .  He does not have rheumatoid arthritis.  No heart failure.  No other collagen vascular disease of connective tissue disease.  No COPD.  No asthma.  No HIV.  No hiatal hernia.  No pulmonary hypertension.  No diabetes.  No thyroid  disease.  No stroke no seizures.  No tuberculosis no kidney disease no pneumonia.  No blood clots no heart disease no pleurisy.   He is deemed to be an aspiration risk he has vocal cord paralysis.  He is to see Dr. Tova Roman GI.  He wants to switch to [facility] but because of the pandemic has not been able to establish.  ROS: He does not have any fatigue.  No arthralgia.  No  dysphagia.  No Raynaud's.  No dry eyes no fever no weight loss.  No nausea no vomiting.  No heartburn no snoring no rash no ulcers.   FAMILY HISTORY of LUNG DISEASE: No pulmonary fibrosis.  No COPD.  No asthma no sarcoid no cystic fibrosis no hypersensitive pneumonitis no autoimmune disease in the family.    EXPOSURE HISTORY: No tobacco.  No smoking cigars no smoking pipes.  No electronic cigarette use.  No vaping marijuana.  No cocaine use.  No intravenous drug use.   HOME and HOBBY DETAILS : He lives in a 79 year old family home.  He is lived there for 40 years.  No damp environment.  No mold or mildew.  No humidifier use no CPAP use no nebulizer use.  No steam iron use.  No Jacuzzi use.  No misting Fountain.  No pet birds or parakeets.  No feather pillows or blankets.  No mold in the [facility] duct.  No music habits no gardening habits.  Never exposed to birds or chickens.  No water damage in the basement.  No straw meds.  No hot tub use.  No animals at work.    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  Organic antigen exposure history is negative.  Inorganic antigen exposure history is negative.   PULMONARY TOXICITY HISTORY (27 items):  Simponi  for psoriatic arthritis      .OV 12/18/2020  Subjective:  Patient ID: MOMODOU CONSIGLIO, male , DOB: 05/05/1946 , age 32 y.o. , MRN: [id number] , ADDRESS: [address] [place] [address] PCP Orlena Bitters, MD Patient Care Team: Orlena Bitters, MD as PCP - General (Internal Medicine) Knox Perl, MD as PCP - Cardiology (Cardiology)  This Provider for this visit: Treatment Team:  Attending Provider: Maire Scot, MD    12/18/2020 -   Chief Complaint  Patient presents with   Follow-up    PFT performed today, HRCT performed 4/25, and EGD scheduled 5/23.  Pt states he has been okay since last visit. States his only concern is hoarseness with voice and an occ cough.     HPI [facility] 78 y.o. -returns for follow-up to discuss his test results.  He continues to have hoarse voice.  He denies any dysphagia.  He is now scheduled for endoscopy 12/21/2020.  He is going to see cardiology ahead  of that.  His serologies negative.  His symptoms continue to be minimal and stable.  He had high-resolution CT scan of the chest that shows progression compared to October 2020.  It is described as probable UIP.  Back in October 2020 there was craniocaudal gradient described and no air trapping.  This time April 2022 radiologist feels there is slight upper lobe predominance and there is air trapping.  This latter description raises the question of possible chronic hypersensitive pneumonitis even though the final conclusion was probable UIP.  I went over exposure history with him again.  No organic antigen exposure history identified.  Nevertheless disease appears progressive.    CT chest HIgh res 11/23/20   IMPRESSION: CLINICAL DATA:  78 year old male with history of bronchiectasis. Follow-up study.   EXAM: CT  CHEST WITHOUT CONTRAST   TECHNIQUE: Multidetector CT imaging of the chest was performed following the standard protocol without intravenous contrast. High resolution imaging of the lungs, as well as inspiratory and expiratory imaging, was performed.   COMPARISON:  Chest CT 05/31/2019.   FINDINGS: Cardiovascular: Heart size is normal. There is no significant pericardial fluid, thickening or pericardial calcification. There is aortic atherosclerosis, as well as atherosclerosis of the great vessels of the mediastinum and the coronary arteries, including calcified atherosclerotic plaque in the left main, left anterior descending and left circumflex coronary arteries.   Mediastinum/Nodes: No pathologically enlarged mediastinal or hilar lymph nodes. Please note that accurate exclusion of hilar adenopathy is limited on noncontrast CT scans. Esophagus is unremarkable in appearance. No axillary lymphadenopathy.   Lungs/Pleura: High-resolution images demonstrate widespread patchy regions of ground-glass attenuation, septal thickening, mild cylindrical bronchiectasis and peripheral bronchiolectasis. Findings have no discernible craniocaudal gradient, and there is significant involvement in the anterior aspects of the upper lobes of the lungs bilaterally. No frank honeycombing. Inspiratory and expiratory imaging demonstrates some mild air trapping indicative of mild small airways disease. Minimal progression compared to the prior examination. No acute consolidative airspace disease. No pleural effusions. No suspicious appearing pulmonary nodules or masses are noted.   Upper Abdomen: Aortic atherosclerosis. 5 mm calcified gallstone in the neck of the gallbladder. Small calcified granuloma in the spleen incidentally noted.   Musculoskeletal: There are no aggressive appearing lytic or blastic lesions noted in the visualized portions of the skeleton 1. The appearance of the lungs is  compatible with interstitial lung disease, with a spectrum of findings once again categorized as probable usual interstitial pneumonia (UIP) per current ATS guidelines. Minimal progression of disease compared to the prior examination. 2. Aortic atherosclerosis, in addition to left main and 2 vessel coronary artery disease. Assessment for potential risk factor modification, dietary therapy or pharmacologic therapy may be warranted, if clinically indicated. 3. Cholelithiasis.   Aortic Atherosclerosis (ICD10-I70.0).     Electronically Signed   By: Alexandria Angel M.D.   On: 11/24/2020 08:04   xxxx Results for [place], [facility] (MRN [id number]) as of 12/18/2020 16:47  Ref. Range 06/03/2019 10:42  Aspergillus Fumigatus Latest Ref Range: NEGATIVE  NEGATIVE  Pigeon Serum Latest Ref Range: NEGATIVE  NEGATIVE  Anti Nuclear Antibody (ANA) Latest Ref Range: NEGATIVE  POSITIVE (A)  ANA Pattern 1 Unknown Nuclear, Homogeneous (A)  ANA Titer 1 Latest Units: titer 1:40 (H)  Anti JO-1 Latest Ref Range: 0.0 - 0.9 AI <0.2  CENTROMERE AB SCREEN Latest Ref Range: <1.0 NEG AI <1.0 NEG  RA Latex Turbid. Latest Ref Range: <14 IU/mL 14 (H)  SSA (Ro) (ENA) Antibody, IgG Latest Ref  Range: <1.0 NEG AI <1.0 NEG  SSB (La) (ENA) Antibody, IgG Latest Ref Range: <1.0 NEG AI <1.0 NEG  Scleroderma (Scl-70) (ENA) Antibody, IgG Latest Ref Range: <1.0 NEG AI <1.0 NEG    OV 01/21/2021  Subjective:  Patient ID: Alan Roman, male , DOB: [birth year] , age 78 y.o. , MRN: [id number] , ADDRESS: [address] [place] [address] PCP Orlena Bitters, MD Patient Care Team: Orlena Bitters, MD as PCP - General (Internal Medicine) Knox Perl, MD as PCP - Cardiology (Cardiology)  This Provider for this visit: Treatment Team:  Attending Provider: Maire Scot, MD    01/21/2021 -   Chief Complaint  Patient presents with   Follow-up    Pt states he has been doing okay since last visit.  States that he has not started on the OFEV  medication yet.     HPI [facility] 78 y.o. -presents for follow-up in this visit he was supposed to have started nintedanib by now.  However he is yet to start this.  There are  supply chain issues.  But he says it is approved.  Overall no change in symptoms.  Unclear if he is finished his GI work-up.  He continues to have some hoarse voice.  He has not seen ENT.  He is accepted my recommendation to have a referral.  He did asked me whether a few episode of chlorine exposure acutely 40 years ago could have caused his pulmonary fibrosis.  I told him while it was possible it is unlikely.  Unclear about single episode although possible as be seen with COVID   02/18/2021 Patient contacted today for 4 week follow-up. He started OFEV  anti-fibrotic on June 30th. He developed headache and diarrhea after 2 days of starting medication. He stopped taking OFEV  4 days ago d/t GI symptoms and fatigue. He also noted that his blood pressure was elevated. His symptoms have resolved since stopping medication.    MDD confderence Sept 2022  Dr Debara Faden - November 23, 2020 - HRCT - compared to CTA 2017 and HRCT 2020. Recent CT  moderage patchy retic, and some GGO. TB is mild . Findings are distriubted evenly - no CCG.  No frank HC. clearly tehre is fibrotic ILD. Air trapping is ver minimal and in lower lobes and is physiologic and not clinicall signifcant. No AT in upper lobe or middle lobe/lingulare   Final DX: Prob UIP and ther is mild progression since 2020 and defintiey since 2017.   Clinical dx:  IPF and Rx as such   OV 04/20/2021  Subjective:  Patient ID: DUJUAN STANKOWSKI, male , DOB: 1946/07/05 , age 43 y.o. , MRN: [id number] , ADDRESS: [address] [place] [address] PCP Orlena Bitters, MD Patient Care Team: Orlena Bitters, MD as PCP - General (Internal Medicine) Knox Perl, MD as PCP - Cardiology (Cardiology)  This Provider for this visit:  Treatment Team:  Attending Provider: Maire Scot, MD    04/20/2021 -   Chief Complaint  Patient presents with   Follow-up    Pt states his BP is still increasing even after going to 1 pill of the OFEV  a day.   IPF: age greater than 65, Velcro crackles at the base, previous description of probable UIP and nearly negative serology [psoriatic arthritis does not count is connective tissue disease ILD], vocal cord paralysis with some aspiration risk that this is IPF.   - Dx given June 2022 - Confirmd MDD conferne Sept 2022   -  Ofev  June 2022  HPI [facility] 78 y.o. -presents for follow-up.  I am meeting his wife for the first time.  He feels he is stable.  He is walking 30 minutes in the mornings and he can do this without stopping.  He says his dyspnea is not worse and his cough is not worse but objectively on the subjective symptom questionnaire there might be progression.  We did discuss his case in the ILD conference.  The diagnosis is IPF.  There is progression.  In terms of therapeutics we started him on nintedanib at 150 mg twice daily but he called saying that he was intolerant to it because of hypertension [anecdotally he is my first patient with hypertension in 6 years with nintedanib] but also diarrhea and fatigue.  He then stopped it and apparently his blood pressure normalized.  Then 04/10/2021 he started himself on nintedanib 100 mg strength.  He is currently doing only 1 time daily.  He feels his blood pressure is going up again to 140 systolic.  [At 150 twice a day it was at 150 systolic] and this is no normal.  He is not having any other symptoms.  We discussed the option of switching to pirfenidone  and going through approval process again.  We discussed the side effects of skin issues, fatigue, nausea, anorexia and ongoing liver monitoring with significant overlap with nintedanib side effects.  At this point in time he is resolved to increase his nintedanib to 100 mg  twice daily and if he is tolerating well and that go to 150 mg twice daily.  If this fails then he will go to pirfenidone .  End of November 2022 he is going to go to [place] till February 2022.  He will have a high-dose flu shot today.         OV 06/07/2021  Subjective:  Patient ID: BARTLETT ENKE, male , DOB: Jan 09, 1946 , age 49 y.o. , MRN: [id number] , ADDRESS: [address] [place] [address] PCP Orlena Bitters, MD Patient Care Team: Orlena Bitters, MD as PCP - General (Internal Medicine) Knox Perl, MD as PCP - Cardiology (Cardiology)  This Provider for this visit: Treatment Team:  Attending Provider: Maire Scot, MD    06/07/2021 -   Chief Complaint  Patient presents with   Follow-up    4wk f/u after starting Ofev . Stopped the Ofev  about 3 weeks ago due to increased blood pressure. Plans to restart it next Monday. Denies any increased nausea, diarrhea or loss of appetite.     HPI [facility] 78 [facility] -returns for his IPF follow-up.  This particular visit is to focus on how he is tolerating his nintedanib.  He is extremely concerned his nintedanib is causing hypertension.  When he was taking 100 mg which is the lower dose at 1 pill/day he did not have hypertension.  He then increase it to 100 mg twice daily [this is the lowest therapeutic dose].  He says this caused his blood pressure systolic to go up to 140s.  He has since stopped taking all nintedanib.  His blood pressure is now normal.  He does not want take nintedanib anymore.  We discussed the alternative of taking pirfenidone .  He is open to this idea.  Pirfenidone  is not known to cause hypertension.  In fact even at nintedanib I am not seen any patient developed hypertension except him.  We discussed the other side effects of pirfenidone  including GI issues, weight loss, nausea, liver  function test monitoring and necessity for sunscreen.  He is willing to do all this.  He has upcoming travel to  [place] towards the end of November 2022 through February 2023.  We explained that not taking the antifibrotic but somewhat less than 5% chance of progression over 3 months.  He does not want to take this risk because of prior progression.  He just wants to take the medication pirfenidone .  So we will make a request for this.  I did advise him to register himself with a pulmonologist and his home city in [place].  I also spoke to his daughter over the phone.  They will arrange this.  He is willing to do video and telephone visit with me when he is in [place].  Of note he developed back pain with right-sided sciatica 3 weeks ago.  He has right-sided sciatica.  He had an MRI that shows DJD findings.  He will follow-up with Dr. Aviva Lemmings orthopedics for this.  He and his wife and daughter Elverna Hamman on the phone] feel that he may not make it [place] because of the back pain.  I advised her to talk about this with Dr. Aviva Lemmings.  Of note he has upcoming pulmonary function test appointment in 1 week.  He also has office visit.  We discussed this with him.  He will reschedule his office visit as a televisit to discuss his pirfenidone  uptake and his pulmonary function test combined.  He will do this from [place].      OV 06/15/2021  Subjective:  Patient ID: IZEAH VOSSLER, male , DOB: Aug 19, 1945 , age 54 y.o. , MRN: [id number] , ADDRESS: [address] [place] [address] PCP Orlena Bitters, MD Patient Care Team: Orlena Bitters, MD as PCP - General (Internal Medicine) Knox Perl, MD as PCP - Cardiology (Cardiology)  This Provider for this visit: Treatment Team:  Attending Provider: Maire Scot, MD    06/15/2021 -   Chief Complaint  Patient presents with   Follow-up    Pft done today. No new concerns     HPI [facility] 78 y.[facility]. -returns for follow-up.  His wife is here with him.  His symptom score shows minimal symptoms and is actually better.  His pulmonary function test shows  slight worsening compared to 2 years ago.  But he is not feeling this.  He is off his nintedanib and is feeling better.  He is decided to start pirfenidone .  I did indicate to him that his progression.  He is here to start.  Pirfenidone  because although approved the shipment is pending.  I sent a message to the pharmacist to see when his shipment would arrive.  His weight is stable.  He is doing some physical therapy for his back.  He is going to see Dr. Aviva Lemmings tomorrow.  He will register with a  pulmonologist in [place].  While in [place] once his [place]  pirfenidone  runs he will get local pirfenidone .  He will get this through his pulmonologist there.       OV 05/17/2022  Subjective:  Patient ID: Alan Roman, male , DOB: 04/19/1946 , age 5 y.o. , MRN: [id number] , ADDRESS: [address] [place] [address] PCP Orlena Bitters, MD Patient Care Team: Orlena Bitters, MD as PCP - General (Internal Medicine) Knox Perl, MD as PCP - Cardiology (Cardiology)  This Provider for this visit: Treatment Team:  Attending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

## 2024-01-12 ENCOUNTER — Other Ambulatory Visit: Payer: Self-pay

## 2024-01-12 ENCOUNTER — Other Ambulatory Visit (HOSPITAL_COMMUNITY): Payer: Self-pay

## 2024-01-12 NOTE — Telephone Encounter (Signed)
 As alluded to, patient's grant through PAF was revoked due to lack of response from patient. Will not be able to reapply for this grant until expiration  Fortunately there is a PF grant open through [facility] which I was able to get him enrolled into this morning: ID: [id number] BIN: [id number] PCN: [id number] Group: [id number] Help Desk: [id number] Provider: PDMI Processor: PDMI  Start Date: 12/13/2023 End Date: 12/11/2024  Balance $9000  Geraldene Kleine, PharmD, MPH, BCPS, CPP Clinical Pharmacist (Rheumatology and Pulmonology)

## 2024-01-15 NOTE — Therapy (Unsigned)
 OUTPATIENT SPEECH LANGUAGE PATHOLOGY VOICE TREATMENT   Patient Name: Alan Roman MRN: [id number] DOB:1946-01-01, 78 y.o., male Today's Date: 01/16/2024  PCP: Dr. Darien Eden REFERRING PROVIDER: Dr. Westley Hammers   END OF SESSION:   End of Session - 01/16/24 0748     Visit Number 2    Number of Visits 9    Date for SLP Re-Evaluation 03/12/24    Authorization Type BC[employer]    SLP Start Time 0800    SLP Stop Time  0845    SLP Time Calculation (min) 45 min    Activity Tolerance Patient tolerated treatment well             Past Medical History:  Diagnosis Date   Acute bronchiolitis due to other infectious organisms 01/26/2013   Arthritis    knees (11/03/2015)   Extrapulmonary TB (tuberculosis) 02/02/2013   antibx therapy in 2014   GERD (gastroesophageal reflux disease)    on meds   High risk medication use 12/27/2016   History of hiatal hernia 2007; 2009   Hyperlipidemia    on meds   Hypertension    on meds   Pleural effusion 01/26/2013   Past Surgical History:  Procedure Laterality Date   BIOPSY  04/01/2021   Procedure: BIOPSY;  Surgeon: Asencion Blacksmith, MD;  Location: [facility];  Service: Endoscopy;;   CATARACT EXTRACTION W/ INTRAOCULAR LENS  IMPLANT, BILATERAL Bilateral    ESOPHAGOGASTRODUODENOSCOPY (EGD) WITH PROPOFOL  N/A 04/01/2021   Procedure: ESOPHAGOGASTRODUODENOSCOPY (EGD) WITH PROPOFOL ;  Surgeon: Asencion Blacksmith, MD;  Location: [facility];  Service: Endoscopy;  Laterality: N/A;   HIATAL HERNIA REPAIR  2007; 2009   SAVORY DILATION N/A 04/01/2021   Procedure: SAVORY DILATION;  Surgeon: Asencion Blacksmith, MD;  Location: [facility];  Service: Endoscopy;  Laterality: N/A;   TOTAL KNEE ARTHROPLASTY Left 11/03/2015   TOTAL KNEE ARTHROPLASTY Left 11/03/2015   Procedure: Left Total Knee Arthroplasty;  Surgeon: Shirlee Dotter, MD;  Location: [facility] OR;  Service: Orthopedics;  Laterality: Left;   TOTAL KNEE ARTHROPLASTY Right 07/17/2018   Procedure: RIGHT TOTAL KNEE  ARTHROPLASTY;  Surgeon: Shirlee Dotter, MD;  Location: [facility] OR;  Service: Orthopedics;  Laterality: Right;   VIDEO BRONCHOSCOPY Bilateral 01/31/2013   Procedure: VIDEO BRONCHOSCOPY WITHOUT FLUORO;  Surgeon: Valiant Gaul, MD;  Location: [facility] ENDOSCOPY;  Service: Cardiopulmonary;  Laterality: Bilateral;   Patient Active Problem List   Diagnosis Date Noted   Pain in right leg 05/26/2021   Dysphagia    Gastroesophageal reflux disease    Esophageal stricture    At risk for aspiration 08/07/2019   ILD (interstitial lung disease) (HCC) 06/03/2019   Elevated diaphragm 06/03/2019   Dyspnea on exertion 05/06/2019   Vocal cord atrophy 05/06/2019   History of total right knee replacement 07/17/2018   Primary osteoarthritis of right knee 05/31/2018   History of tuberculosis 11/05/2015   Orthostatic hypotension 11/05/2015   Constipation 11/05/2015   Thrombocytopenia (HCC) 11/05/2015   Normocytic anemia 11/05/2015   Psoriatic arthritis (HCC) 11/03/2015   Essential hypertension 11/03/2015   Osteoarthritis of left knee 11/03/2015   Status post total knee replacement, left 11/03/2015   Psoriasis 01/23/2013   Immunocompromised state (HCC) 01/23/2013    Referral date: 11/16/2023   REFERRING DIAG:  R49.0 (ICD-10-CM) - Dysphonia     THERAPY DIAG: Dysphonia  Rationale for Evaluation and Treatment: Rehabilitation  SUBJECTIVE:   SUBJECTIVE STATEMENT: Returned with Power Breathe and The Breather (both devices maxed out) Pt accompanied by: significant other  PERTINENT HISTORY: Per ENT documentation, 11/15/2023: He reports a sudden onset of hoarseness, which has been persistent for the past 3 to 4 months. He does not experience any associated pain during speech or swallowing. His appetite remains unaffected, although he occasionally experiences difficulty swallowing certain foods, particularly dry fruits as well as pills. He does not consume meat and has no issues with liquid intake. He maintains  adequate hydration and reports no weight loss. He was previously seen by laryngology in [facility] and underwent bilateral injection laryngoplasty with Dr. Leonette Ramal for vocal fold paresis. He also was diagnosed with muscle tension dysphonia and underwent speech therapy, with some improvement in symptoms. He has not undergone a swallow study.  He has acid reflux and takes medication intermittently. He is currently on Esbriet  for pulmonary fibrosis and is under the care of a pulmonologist. He does not smoke or drink alcohol .   Impression/Plan: history of muscle tension dysphonia, bilateral true vocal fold paresis, status post bilateral injection laryngoplasty with laryngology in 2016 presenting for evaluation of hoarseness and dysphagia to dried foods and pills. Laryngoscopy performed today with findings as above. I recommend referral to speech-language pathology for consideration of voice therapy, as well as proceeding with barium swallow study to evaluate patient's dysphagia.   PAIN:  Are you having pain? No  FALLS: Has patient fallen in last 6 months? No, Number of falls: 0  LIVING ENVIRONMENT: Lives with: lives with their spouse Lives in: House/apartment  PLOF:Level of assistance: Independent  Employment: Retired  PATIENT GOALS: improved voice, reduced fatigue   OBJECTIVE:  Note: Objective measures were completed at Evaluation unless otherwise noted.  DIAGNOSTIC FINDINGS: Laryngoscopy 11/15/2023 Flexible laryngoscopy shows patent anterior nasal cavity with minimal crusting, no discharge or infection.  Mild to moderate interarytenoid edema and erythema consistent with circumferential reflux, otherwise normal base of tongue and supraglottis Normal vocal cord mobility. Significant supraglottic compression with phonation, consistent with muscle tension dysphonia. Unable to visualize complete adduction of true vocal fold secondary to false cord compression. Hypopharynx normal without  mass, pooling of secretions or aspiration.   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 3 How often do you do the following?  Shout or scream: never Talk loudly: rarely Talk a lot: often Talk over noise: rarely  Use the phone: quite a bit Sing: never  Surgical history: VF injection (2016) without reported benefit   PHYSICAL SYMPTOMS: Do you have any burning, soreness, tickling, or irritation in your throat? No Do you sometimes have a sensation of a lump in your throat?  No Do you have any aching or tightness in your throat? No Do you ever feel tension in your neck area? No Does your voice get tired easily? Yes ( when speaking for extended time)  Do you feel as if you have to strain to produce voice? No Do you feel as if you need to cough or clear your throat a lot? Yes Do you ever lose your voice completely? No Do you ever have difficulty swallowing? No (exception, gel tablets) Do you have difficulty projecting your voice? No  VOCAL ABUSE:  Episodes observed during evaluation: throat clearing, coughing  Characteristics observed during evaluation: throat clearing x5, coughing x4 Pt  report of frequency: daily Pt report of duration: years  Identified triggers: Acid reflux , Postnasal drip, and Voice overuse  PERCEPTUAL VOICE ASSESSMENT: Voice quality: hoarse, breathy, and vocal fatigue Vocal abuse: habitual throat clearing Resonance: normal Respiratory function: thoracic breathing  OBJECTIVE VOICE ASSESSMENT: Maximum phonation time for sustained ah: 6 seconds  Conversational pitch average: WFL Conversational loudness average: 74 dB S/z ratio: 1.14 (Suggestive of  dysfunction >1.0)  STIMULIBILITY TRIALS FOR IMPROVED VOCAL QUALITY: Oral reading, phrase level: mild improvement in volume and speech clarity. Ongoing evidence of reduced breath support in insufficient effort for sustained vocal quality improvement over phrase length.   Pt does not report difficulty with swallowing, which does not warrant further evaluation  PATIENT REPORTED OUTCOME MEASURES (PROM): Voice Related Quality of Life Measure Pt was asked to rate on a scale of 1-5 how much of a problem different speaking situations have been over the past two weeks     Because of my voice...  Pt Rating  I have trouble speaking loudly or being heard in noisy situations 3  I run out of air and need to take frequent breaths when talking 4  I sometimes do not know what will come out when I begin speaking 3  I am sometimes anxious or frustrated because of my voice 4  I sometimes get depressed because of my voice 3  I have trouble using the telephone because of my voice 3  I have trouble doing my job or practicing my profession because of my voice (modification to social activities) 3  I avoid going out socially because of my voice 2  I have to repeat myself to be understood 3  I have become less outgoing because of my voice 2  1= none, not a problem 5= problem is as bad as it can be  Calculated score of 50 (>80 is considered WNL)   TODAY'S TREATMENT:                                                                                                                   01/16/24: Initiated education and instruction of throat clear alternatives, behavioral reflux management, cough suppression (sniff-sniff-blow), and SOVT exercises, with handouts provided. Exhibited throat clears x4 today, with SLP cuing for awareness and redirection to water. Provided occasional mod fading to min A during voice exercises to reduce upper body tension and decrease strain. Benefited from trials of resonant and flow vowels  exercises to optimize tactile awareness of oral resonance. Updated HEP for SOVTE.  12/19/23: Initiated education regarding pathophysiology of voice complaints and presentation. Discussed vocal fold atrophy and ventricular phonation resulting in decreased voice quality. Education provided regarding role of breath support on speech. Initiated training for clear speech. Modeled loud, over-articulation, pacing.   PATIENT EDUCATION: Education details: see above Person educated: Patient, Spouse, and Child(ren) Education method: Medical illustrator Education comprehension: verbalized understanding, returned demonstration, and needs further education  HOME EXERCISE PROGRAM: Bring pulm tools next session  GOALS: Goals reviewed with patient?  Yes  SHORT TERM GOALS: Target date: 02/13/2024  Pt will teach back vocal hygiene recommendations with mod-I Baseline: Goal status: ONGOING  2.  Pt will demonstrate accuracy during voice exercises targeting vocal fold atrophy and muscle tension dysphonia with modeling and cues from SLP  Baseline:  Goal status: ONGOING  3.  Pt will accurately implement respiratory muscle strength training (inspiratory, expiratory) to optimize breath support for speech  Baseline: has breather device from prior ST course  Goal status: ONGOING  4.  Pt will demonstrate throat clear or cough alternative in 50% of opportunities over 2 sessions  Baseline:  Goal status: ONGOING   LONG TERM GOALS: Target date: 03/12/2024  Pt will demonstrate carryover of targeted techniques and strategies into 10 minute conversation with improved vocal quality  Baseline:  Goal status: ONGOING  2.  Pt will report ongoing implementation of vocal hygiene techniques over 2 week period  Baseline:  Goal status: ONGOING  3.  Pt will report improvement via PROM by d/c Baseline: Calculated score of 50 at evaluation  Goal status: ONGOING   ASSESSMENT:  CLINICAL IMPRESSION: Patient  is a 78 y.o. M who was seen today for voice tx. Evaluation revealed moderate dysphonia. Pt's voice is c/b hoarse, breathy vocal quality with reduced breath support for speech. Pt's presentation is c/w vocal fold atrophy and muscle tension dysphonia. Pt demonstrates fair stimulibility during phrase level reading task for improved vocal quality. In addition to dysphonia, pt also evidences chronic cough and throat clearing. Reports ongoing many years. Druing today's evaluation, pt appeared to have throat clear or cough response related to voice use (e.g., following extended spoken response or during increased effort when speaking).  I recommend skilled ST to address dysphonia to enhance communication efficacy and QoL.   OBJECTIVE IMPAIRMENTS: include voice disorder. These impairments are limiting patient from effectively communicating at home and in community. Factors affecting potential to achieve goals and functional outcome are time since onset, severity of condition, etiology. Patient will benefit from skilled SLP services to address above impairments and improve overall function.  REHAB POTENTIAL: Good  PLAN:  SLP FREQUENCY: 2x/week  SLP DURATION: 12 weeks  PLANNED INTERVENTIONS: 92507 Treatment of speech (30 or 45 min)  and 62952- Speech Eval Behavioral Qualitative Voice Resonance  Tamar Fairly, CCC-SLP 01/16/2024, 7:49 AM

## 2024-01-16 ENCOUNTER — Ambulatory Visit: Attending: Otolaryngology

## 2024-01-16 DIAGNOSIS — R490 Dysphonia: Secondary | ICD-10-CM | POA: Insufficient documentation

## 2024-01-16 NOTE — Patient Instructions (Addendum)
 Semi-occluded vocal tract exercises (SOVTE)  These allow your vocal folds to vibrate without excess tension and promotes high placement of the voice  Use SOVTE as a warm up before prolonged speaking and vocal exercises  Watch Vocal Straw Exercises with Lenox Ahr on YouTube: [email or web address]  Make sure your lips are rounded and sealed around straw   Exercises: (2-3x each exercise)  Blow air through straw Hum through straw  Hum (low to high pitch) through straw Hum (high to low pitch) through straw   Hum "[employer]" through [employer] "happy birthday" through [employer] air through straw into water  Hum through straw into water  Hum (low to high pitch) through straw into water  Hum (high to low pitch) through straw into water   Hum "[employer]" through straw into water   Hum "happy birthday" through straw into water   A goal would be 2-3 minutes several times a day and prior to vocal exercises  As always, use good belly breathing while completing SOVTE

## 2024-01-19 ENCOUNTER — Ambulatory Visit: Admitting: Speech Pathology

## 2024-01-19 DIAGNOSIS — R490 Dysphonia: Secondary | ICD-10-CM

## 2024-01-19 NOTE — Patient Instructions (Signed)
 Semi-occluded vocal tract exercises (SOVTE)  Exercises: (2-3x each exercise)  Blow air through straw  Hum through straw  Hum (low to high pitch) through straw  Hum (high to low pitch) through straw   Hum "[employer]" through [employer] "happy birthday" through straw   2. Vocal Function Exercises  Exercises: 5 reps each exercise Sustained hold -- mid pitch Sustained hold -- high pitch Sustained hold -- low pitch Low to high glide and hold High to low glide and hold   3. Speaking Exercises  Say each phrase with high pitch, low pitch, comfortable pitch Use your energized voice!  Focus on maintaining strength throughout the whole phrase   Hi there.  Good morning  How are you  Who is calling  Come over here  What's wrong?  Let's go home  I am feeling fine   Get the phone  What is your name?   FOR ALL EXERCISES: USE THAT GOOD BREATH SUPPORT. USE BELLY BREATHING THROUGHOUT.

## 2024-01-19 NOTE — Therapy (Signed)
 OUTPATIENT SPEECH LANGUAGE PATHOLOGY VOICE TREATMENT   Patient Name: Alan Roman MRN: [id number] DOB:[birth year], 78 y.o., male Today's Date: 01/19/2024  PCP: Dr. Darien Eden REFERRING PROVIDER: Dr. Westley Hammers   END OF SESSION:   End of Session - 01/19/24 0750     Visit Number 3    Number of Visits 9    Date for SLP Re-Evaluation 03/12/24    Authorization Type BCBS    SLP Start Time 0800    SLP Stop Time  0845    SLP Time Calculation (min) 45 min    Activity Tolerance Patient tolerated treatment well             Past Medical History:  Diagnosis Date   Acute bronchiolitis due to other infectious organisms 01/26/2013   Arthritis    knees (11/03/2015)   Extrapulmonary TB (tuberculosis) 02/02/2013   antibx therapy in 2014   GERD (gastroesophageal reflux disease)    on meds   High risk medication use 12/27/2016   History of hiatal hernia 2007; 2009   Hyperlipidemia    on meds   Hypertension    on meds   Pleural effusion 01/26/2013   Past Surgical History:  Procedure Laterality Date   BIOPSY  04/01/2021   Procedure: BIOPSY;  Surgeon: Asencion Blacksmith, MD;  Location: [facility];  Service: Endoscopy;;   CATARACT EXTRACTION W/ INTRAOCULAR LENS  IMPLANT, BILATERAL Bilateral    ESOPHAGOGASTRODUODENOSCOPY (EGD) WITH PROPOFOL  N/A 04/01/2021   Procedure: ESOPHAGOGASTRODUODENOSCOPY (EGD) WITH PROPOFOL ;  Surgeon: Asencion Blacksmith, MD;  Location: [facility];  Service: Endoscopy;  Laterality: N/A;   HIATAL HERNIA REPAIR  2007; 2009   SAVORY DILATION N/A 04/01/2021   Procedure: SAVORY DILATION;  Surgeon: Asencion Blacksmith, MD;  Location: [facility];  Service: Endoscopy;  Laterality: N/A;   TOTAL KNEE ARTHROPLASTY Left 11/03/2015   TOTAL KNEE ARTHROPLASTY Left 11/03/2015   Procedure: Left Total Knee Arthroplasty;  Surgeon: Shirlee Dotter, MD;  Location[facility] OR;  Service: Orthopedics;  Laterality: Left;   TOTAL KNEE ARTHROPLASTY Right 07/17/2018   Procedure: RIGHT TOTAL KNEE  ARTHROPLASTY;  Surgeon: Shirlee Dotter, MD;  Location: [facility] OR;  Service: Orthopedics;  Laterality: Right;   VIDEO BRONCHOSCOPY Bilateral 01/31/2013   Procedure: VIDEO BRONCHOSCOPY WITHOUT FLUORO;  Surgeon: Valiant Gaul, MD;  Location: [facility] ENDOSCOPY;  Service: Cardiopulmonary;  Laterality: Bilateral;   Patient Active Problem List   Diagnosis Date Noted   Pain in right leg 05/26/2021   Dysphagia    Gastroesophageal reflux disease    Esophageal stricture    At risk for aspiration 08/07/2019   ILD (interstitial lung disease) (HCC) 06/03/2019   Elevated diaphragm 06/03/2019   Dyspnea on exertion 05/06/2019   Vocal cord atrophy 05/06/2019   History of total right knee replacement 07/17/2018   Primary osteoarthritis of right knee 05/31/2018   History of tuberculosis 11/05/2015   Orthostatic hypotension 11/05/2015   Constipation 11/05/2015   Thrombocytopenia (HCC) 11/05/2015   Normocytic anemia 11/05/2015   Psoriatic arthritis (HCC) 11/03/2015   Essential hypertension 11/03/2015   Osteoarthritis of left knee 11/03/2015   Status post total knee replacement, left 11/03/2015   Psoriasis 01/23/2013   Immunocompromised state (HCC) 01/23/2013    Referral date: 11/16/2023   REFERRING DIAG:  R49.0 (ICD-10-CM) - Dysphonia     THERAPY DIAG: Dysphonia  Rationale for Evaluation and Treatment: Rehabilitation  SUBJECTIVE:   SUBJECTIVE STATEMENT: Pt reports compliance with HEP Pt accompanied by: significant other  PERTINENT HISTORY: Per ENT documentation, 11/15/2023:  He reports a sudden onset of hoarseness, which has been persistent for the past 3 to 4 months. He does not experience any associated pain during speech or swallowing. His appetite remains unaffected, although he occasionally experiences difficulty swallowing certain foods, particularly dry fruits as well as pills. He does not consume meat and has no issues with liquid intake. He maintains adequate hydration and reports no weight  loss. He was previously seen by laryngology in [facility] and underwent bilateral injection laryngoplasty with Dr. Leonette Ramal for vocal fold paresis. He also was diagnosed with muscle tension dysphonia and underwent speech therapy, with some improvement in symptoms. He has not undergone a swallow study.  He has acid reflux and takes medication intermittently. He is currently on Esbriet  for pulmonary fibrosis and is under the care of a pulmonologist. He does not smoke or drink alcohol .   Impression/Plan: history of muscle tension dysphonia, bilateral true vocal fold paresis, status post bilateral injection laryngoplasty with laryngology in 2016 presenting for evaluation of hoarseness and dysphagia to dried foods and pills. Laryngoscopy performed today with findings as above. I recommend referral to speech-language pathology for consideration of voice therapy, as well as proceeding with barium swallow study to evaluate patient's dysphagia.   PAIN:  Are you having pain? No  FALLS: Has patient fallen in last 6 months? No, Number of falls: 0  LIVING ENVIRONMENT: Lives with: lives with their spouse Lives in: House/apartment  PLOF:Level of assistance: Independent  Employment: Retired  PATIENT GOALS: improved voice, reduced fatigue   OBJECTIVE:  Note: Objective measures were completed at Evaluation unless otherwise noted.  TODAY'S TREATMENT:                                                                                                                   01/19/24: SLP led pt through SOVT utilizing straw in water to provide biofeedback. Completed sustained phonation, glides, sirens, and song with occasional model and feedback to optimize completion and identify strained vs clear vocal quality. Pt completed 3x5 sets of inhalation and exhalation straw exercises using breather device to optimize breath support prior to voice exercise completion. SLP introduced vocal function exercises with cued use  of powerful and clear voice. Led pt through sustained phonation at mid, low, and high pitches, pitch glides with hold at end. Pt benefits from cues for energized voice. Increased effort results in more consistently clear vocal quality. Provided short phrases for practice using energized voice while speaking with pt demonstrating occasional quality decay, which corrects with cues for maintaining intensity. Challenges evidenced in carryover of strong voice.   01/16/24: Initiated education and instruction of throat clear alternatives, behavioral reflux management, cough suppression (sniff-sniff-blow), and SOVT exercises, with handouts provided. Exhibited throat clears x4 today, with SLP cuing for awareness and redirection to water. Provided occasional mod fading to min A during voice exercises to reduce upper body tension and decrease strain. Benefited from trials of resonant and flow vowels exercises to optimize tactile awareness of oral resonance. Updated HEP for  SOVTE.  12/19/23: Initiated education regarding pathophysiology of voice complaints and presentation. Discussed vocal fold atrophy and ventricular phonation resulting in decreased voice quality. Education provided regarding role of breath support on speech. Initiated training for clear speech. Modeled loud, over-articulation, pacing.   PATIENT EDUCATION: Education details: see above Person educated: Patient, Spouse, and Child(ren) Education method: Medical illustrator Education comprehension: verbalized understanding, returned demonstration, and needs further education  HOME EXERCISE PROGRAM: Bring pulm tools next session  GOALS: Goals reviewed with patient? Yes  SHORT TERM GOALS: Target date: 02/13/2024  Pt will teach back vocal hygiene recommendations with mod-I Baseline: Goal status: ONGOING  2.  Pt will demonstrate accuracy during voice exercises targeting vocal fold atrophy and muscle tension dysphonia with modeling and  cues from SLP  Baseline:  Goal status: ONGOING  3.  Pt will accurately implement respiratory muscle strength training (inspiratory, expiratory) to optimize breath support for speech  Baseline: has breather device from prior ST course  Goal status: ONGOING  4.  Pt will demonstrate throat clear or cough alternative in 50% of opportunities over 2 sessions  Baseline:  Goal status: ONGOING   LONG TERM GOALS: Target date: 03/12/2024  Pt will demonstrate carryover of targeted techniques and strategies into 10 minute conversation with improved vocal quality  Baseline:  Goal status: ONGOING  2.  Pt will report ongoing implementation of vocal hygiene techniques over 2 week period  Baseline:  Goal status: ONGOING  3.  Pt will report improvement via PROM by d/c Baseline: Calculated score of 50 at evaluation  Goal status: ONGOING   ASSESSMENT:  CLINICAL IMPRESSION: Patient is a 78 y.o. M who was seen today for voice tx. Evaluation revealed moderate dysphonia. Pt's voice is c/b hoarse, breathy vocal quality with reduced breath support for speech. Pt's presentation is c/w vocal fold atrophy and muscle tension dysphonia. Pt demonstrates fair stimulibility during phrase level reading task for improved vocal quality. In addition to dysphonia, pt also evidences chronic cough and throat clearing. Reports ongoing many years. Druing today's evaluation, pt appeared to have throat clear or cough response related to voice use (e.g., following extended spoken response or during increased effort when speaking).  I recommend skilled ST to address dysphonia to enhance communication efficacy and QoL.   OBJECTIVE IMPAIRMENTS: include voice disorder. These impairments are limiting patient from effectively communicating at home and in community. Factors affecting potential to achieve goals and functional outcome are time since onset, severity of condition, etiology. Patient will benefit from skilled SLP services to  address above impairments and improve overall function.  REHAB POTENTIAL: Good  PLAN:  SLP FREQUENCY: 2x/week  SLP DURATION: 12 weeks  PLANNED INTERVENTIONS: 92507 Treatment of speech (30 or 45 min)  and 16109- Speech Eval Behavioral Qualitative Voice Resonance  Alston Jerry, CCC-SLP 01/19/2024, 7:50 AM

## 2024-01-22 ENCOUNTER — Telehealth: Payer: Self-pay | Admitting: Internal Medicine

## 2024-01-22 ENCOUNTER — Other Ambulatory Visit: Payer: Self-pay

## 2024-01-22 DIAGNOSIS — D649 Anemia, unspecified: Secondary | ICD-10-CM

## 2024-01-22 NOTE — Telephone Encounter (Signed)
 Recent labs on January 11, 2024 showed new onset possible mild anemia with a hemoglobin 13 g%.  He is vegetarian.  He seems to be on iron tablets already.  Plan - Advised him to recheck CBC with differential and also iron panel in 3 weeks and if he is still anemic he should talk to primary care doctor.  -He should check this sometime between July 7 and February 13, 2024  I have placed the order  SIGNATURE    Dr. Dorethia Cave, M.D., F.C.C.P,  Pulmonary and Critical Care Medicine Staff Physician, [facility] Center Director - Interstitial Lung Disease  Program  Pulmonary Fibrosis [facility] at [facility] [place], [place], [address]   Pager: [phone], If no answer  -> Check AMION or Try [phone] Telephone (clinical office): [phone] Telephone (research): [phone]  9:39 AM 01/22/2024

## 2024-01-22 NOTE — Therapy (Unsigned)
 OUTPATIENT SPEECH LANGUAGE PATHOLOGY VOICE TREATMENT   Patient Name: Alan Roman MRN: [id number] DOB:11-28-1945, 78 y.o., male Today's Date: 01/23/2024  PCP: Dr. Rosamond REFERRING PROVIDER: Dr. Llewellyn   END OF SESSION:   End of Session - 01/23/24 0752     Visit Number 4    Number of Visits 9    Date for SLP Re-Evaluation 03/12/24    Authorization Type BCBS    SLP Start Time 0800    SLP Stop Time  0840    SLP Time Calculation (min) 40 min    Activity Tolerance Patient tolerated treatment well              Past Medical History:  Diagnosis Date   Acute bronchiolitis due to other infectious organisms 01/26/2013   Arthritis    knees (11/03/2015)   Extrapulmonary TB (tuberculosis) 02/02/2013   antibx therapy in 2014   GERD (gastroesophageal reflux disease)    on meds   High risk medication use 12/27/2016   History of hiatal hernia 2007; 2009   Hyperlipidemia    on meds   Hypertension    on meds   Pleural effusion 01/26/2013   Past Surgical History:  Procedure Laterality Date   BIOPSY  04/01/2021   Procedure: BIOPSY;  Surgeon: Aneita Gwendlyn DASEN, MD;  Location: [facility];  Service: Endoscopy;;   CATARACT EXTRACTION W/ INTRAOCULAR LENS  IMPLANT, BILATERAL Bilateral    ESOPHAGOGASTRODUODENOSCOPY (EGD) WITH PROPOFOL  N/A 04/01/2021   Procedure: ESOPHAGOGASTRODUODENOSCOPY (EGD) WITH PROPOFOL ;  Surgeon: Aneita Gwendlyn DASEN, MD;  Location: [facility];  Service: Endoscopy;  Laterality: N/A;   HIATAL HERNIA REPAIR  2007; 2009   SAVORY DILATION N/A 04/01/2021   Procedure: SAVORY DILATION;  Surgeon: Aneita Gwendlyn DASEN, MD;  Location: [facility];  Service: Endoscopy;  Laterality: N/A;   TOTAL KNEE ARTHROPLASTY Left 11/03/2015   TOTAL KNEE ARTHROPLASTY Left 11/03/2015   Procedure: Left Total Knee Arthroplasty;  Surgeon: Maude LELON Right, MD;  Location: [facility] OR;  Service: Orthopedics;  Laterality: Left;   TOTAL KNEE ARTHROPLASTY Right 07/17/2018   Procedure: RIGHT TOTAL  KNEE ARTHROPLASTY;  Surgeon: Right Maude LELON, MD;  Location: [facility] OR;  Service: Orthopedics;  Laterality: Right;   VIDEO BRONCHOSCOPY Bilateral 01/31/2013   Procedure: VIDEO BRONCHOSCOPY WITHOUT FLUORO;  Surgeon: Alm KATHEE Nett, MD;  Location: [facility] ENDOSCOPY;  Service: Cardiopulmonary;  Laterality: Bilateral;   Patient Active Problem List   Diagnosis Date Noted   Pain in right leg 05/26/2021   Dysphagia    Gastroesophageal reflux disease    Esophageal stricture    At risk for aspiration 08/07/2019   ILD (interstitial lung disease) (HCC) 06/03/2019   Elevated diaphragm 06/03/2019   Dyspnea on exertion 05/06/2019   Vocal cord atrophy 05/06/2019   History of total right knee replacement 07/17/2018   Primary osteoarthritis of right knee 05/31/2018   History of tuberculosis 11/05/2015   Orthostatic hypotension 11/05/2015   Constipation 11/05/2015   Thrombocytopenia (HCC) 11/05/2015   Normocytic anemia 11/05/2015   Psoriatic arthritis (HCC) 11/03/2015   Essential hypertension 11/03/2015   Osteoarthritis of left knee 11/03/2015   Status post total knee replacement, left 11/03/2015   Psoriasis 01/23/2013   Immunocompromised state (HCC) 01/23/2013    Referral date: 11/16/2023   REFERRING DIAG:  R49.0 (ICD-10-CM) - Dysphonia     THERAPY DIAG: Dysphonia  Rationale for Evaluation and Treatment: Rehabilitation  SUBJECTIVE:   SUBJECTIVE STATEMENT: its going well  Pt accompanied by: significant other  PERTINENT HISTORY: Per ENT documentation, 11/15/2023:  He reports a sudden onset of hoarseness, which has been persistent for the past 3 to 4 months. He does not experience any associated pain during speech or swallowing. His appetite remains unaffected, although he occasionally experiences difficulty swallowing certain foods, particularly dry fruits as well as pills. He does not consume meat and has no issues with liquid intake. He maintains adequate hydration and reports no weight loss.  He was previously seen by laryngology in [facility] and underwent bilateral injection laryngoplasty with Dr. Mirna Cohens for vocal fold paresis. He also was diagnosed with muscle tension dysphonia and underwent speech therapy, with some improvement in symptoms. He has not undergone a swallow study.  He has acid reflux and takes medication intermittently. He is currently on Esbriet  for pulmonary fibrosis and is under the care of a pulmonologist. He does not smoke or drink alcohol .   Impression/Plan: history of muscle tension dysphonia, bilateral true vocal fold paresis, status post bilateral injection laryngoplasty with laryngology in 2016 presenting for evaluation of hoarseness and dysphagia to dried foods and pills. Laryngoscopy performed today with findings as above. I recommend referral to speech-language pathology for consideration of voice therapy, as well as proceeding with barium swallow study to evaluate patient's dysphagia.   PAIN:  Are you having pain? No  FALLS: Has patient fallen in last 6 months? No, Number of falls: 0  LIVING ENVIRONMENT: Lives with: lives with their spouse Lives in: House/apartment  PLOF:Level of assistance: Independent  Employment: Retired  PATIENT GOALS: improved voice, reduced fatigue   OBJECTIVE:  Note: Objective measures were completed at Evaluation unless otherwise noted.  TODAY'S TREATMENT:                                                                                                                   01/23/24: Reported completion of HEP. Warm-up conducted with SOVTE, with occasional min A provided to optimize intensity. Difficulty achieving varied pitch exhibited, despite model and max A. Measured MIP across 3 trials (31, 32, 25) with IMST device set to 22 cm H2O. Conducted completion of recommended 25 total reps with rare min A provided to optimize accuracy. Rated final set as 6/10 challenge level. Intermittent throat clearing evidenced today,  with consistent cues provided for hydration versus throat clearing. Mild improvements in vocal intensity and clarity exhibited; however, frequent decline in clarity and volume noted at end of utterances.  01/19/24: SLP led pt through SOVT utilizing straw in water to provide biofeedback. Completed sustained phonation, glides, sirens, and song with occasional model and feedback to optimize completion and identify strained vs clear vocal quality. Pt completed 3x5 sets of inhalation and exhalation straw exercises using breather device to optimize breath support prior to voice exercise completion. SLP introduced vocal function exercises with cued use of powerful and clear voice. Led pt through sustained phonation at mid, low, and high pitches, pitch glides with hold at end. Pt benefits from cues for energized voice. Increased effort results in more consistently clear vocal quality. Provided short phrases for  practice using energized voice while speaking with pt demonstrating occasional quality decay, which corrects with cues for maintaining intensity. Challenges evidenced in carryover of strong voice.   01/16/24: Initiated education and instruction of throat clear alternatives, behavioral reflux management, cough suppression (sniff-sniff-blow), and SOVT exercises, with handouts provided. Exhibited throat clears x4 today, with SLP cuing for awareness and redirection to water. Provided occasional mod fading to min A during voice exercises to reduce upper body tension and decrease strain. Benefited from trials of resonant and flow vowels exercises to optimize tactile awareness of oral resonance. Updated HEP for SOVTE.  12/19/23: Initiated education regarding pathophysiology of voice complaints and presentation. Discussed vocal fold atrophy and ventricular phonation resulting in decreased voice quality. Education provided regarding role of breath support on speech. Initiated training for clear speech. Modeled loud,  over-articulation, pacing.   PATIENT EDUCATION: Education details: see above Person educated: Patient, Spouse, and Child(ren) Education method: Medical illustrator Education comprehension: verbalized understanding, returned demonstration, and needs further education  HOME EXERCISE PROGRAM: Bring pulm tools next session  GOALS: Goals reviewed with patient? Yes  SHORT TERM GOALS: Target date: 02/13/2024  Pt will teach back vocal hygiene recommendations with mod-I Baseline: Goal status: ONGOING  2.  Pt will demonstrate accuracy during voice exercises targeting vocal fold atrophy and muscle tension dysphonia with modeling and cues from SLP  Baseline:  Goal status: ONGOING  3.  Pt will accurately implement respiratory muscle strength training (inspiratory, expiratory) to optimize breath support for speech  Baseline: has breather device from prior ST course  Goal status: ONGOING  4.  Pt will demonstrate throat clear or cough alternative in 50% of opportunities over 2 sessions  Baseline:  Goal status: ONGOING   LONG TERM GOALS: Target date: 03/12/2024  Pt will demonstrate carryover of targeted techniques and strategies into 10 minute conversation with improved vocal quality  Baseline:  Goal status: ONGOING  2.  Pt will report ongoing implementation of vocal hygiene techniques over 2 week period  Baseline:  Goal status: ONGOING  3.  Pt will report improvement via PROM by d/c Baseline: Calculated score of 50 at evaluation  Goal status: ONGOING   ASSESSMENT:  CLINICAL IMPRESSION: Patient is a 78 y.o. M who was seen today for voice tx. Evaluation revealed moderate dysphonia. Pt's voice is c/b hoarse, breathy vocal quality with reduced breath support for speech. Pt's presentation is c/w vocal fold atrophy and muscle tension dysphonia. Pt demonstrates fair stimulibility during phrase level reading task for improved vocal quality. In addition to dysphonia, pt also  evidences chronic cough and throat clearing. Reports ongoing many years. Druing today's evaluation, pt appeared to have throat clear or cough response related to voice use (e.g., following extended spoken response or during increased effort when speaking).  I recommend skilled ST to address dysphonia to enhance communication efficacy and QoL.   OBJECTIVE IMPAIRMENTS: include voice disorder. These impairments are limiting patient from effectively communicating at home and in community. Factors affecting potential to achieve goals and functional outcome are time since onset, severity of condition, etiology. Patient will benefit from skilled SLP services to address above impairments and improve overall function.  REHAB POTENTIAL: Good  PLAN:  SLP FREQUENCY: 2x/week  SLP DURATION: 12 weeks  PLANNED INTERVENTIONS: 92507 Treatment of speech (30 or 45 min)  and [id number]- Speech Eval Behavioral Qualitative Voice Resonance  Comer LILLETTE Louder, CCC-SLP 01/23/2024, 7:53 AM

## 2024-01-23 ENCOUNTER — Ambulatory Visit

## 2024-01-23 DIAGNOSIS — R490 Dysphonia: Secondary | ICD-10-CM | POA: Diagnosis not present

## 2024-01-23 NOTE — Patient Instructions (Addendum)
 Inspiratory Muscle Strength Training (IMST):   Your device is currently set at 22 cm H2O.   Exercises: 5 sets of 5 reps for 25 total reps for 5/7 days a week. Rest 1-2 minutes in between each set. We are aiming to do this for at least 4 weeks. We will periodically reassess to ensure the device is at the correct resistance to maximize your performance.   Instructions:  Place nose clip on.  Breath out all the way.  Take a short, strong inhale in. Sounds kind of like a bike pump.   Do this 5 times.  Rest 1-2 minutes in between. Take more time if needed.  Repeat process for 5 total sets (25 total repetitions).   Disclaimer: if you feel light headed or dizzy, stop and take a break. If it gets too challenging, stop the exercises and let your SLP know. Please don't alter the pressure gauge. If you have any medical concerns related to using the device, please contact your MD.   Continue with other recommended voice exercises.

## 2024-01-24 ENCOUNTER — Other Ambulatory Visit: Payer: Self-pay

## 2024-01-26 ENCOUNTER — Ambulatory Visit: Admitting: Speech Pathology

## 2024-01-26 DIAGNOSIS — R490 Dysphonia: Secondary | ICD-10-CM | POA: Diagnosis not present

## 2024-01-26 NOTE — Therapy (Signed)
 OUTPATIENT SPEECH LANGUAGE PATHOLOGY VOICE TREATMENT   Patient Name: Alan Roman MRN: [id number] DOB:1945-10-28, 78 y.o., male Today's Date: 01/26/2024  PCP: Dr. Rosamond REFERRING PROVIDER: Dr. Llewellyn   END OF SESSION:   End of Session - 01/26/24 0801     Visit Number 5    Number of Visits 9    Date for SLP Re-Evaluation 03/12/24    Authorization Type [employer]    SLP Start Time 0800    SLP Stop Time  0843    SLP Time Calculation (min) 43 min    Activity Tolerance Patient tolerated treatment well              Past Medical History:  Diagnosis Date   Acute bronchiolitis due to other infectious organisms 01/26/2013   Arthritis    knees (11/03/2015)   Extrapulmonary TB (tuberculosis) 02/02/2013   antibx therapy in 2014   GERD (gastroesophageal reflux disease)    on meds   High risk medication use 12/27/2016   History of hiatal hernia 2007; 2009   Hyperlipidemia    on meds   Hypertension    on meds   Pleural effusion 01/26/2013   Past Surgical History:  Procedure Laterality Date   BIOPSY  04/01/2021   Procedure: BIOPSY;  Surgeon: Aneita Gwendlyn DASEN, MD;  Location: [facility];  Service: Endoscopy;;   CATARACT EXTRACTION W/ INTRAOCULAR LENS  IMPLANT, BILATERAL Bilateral    ESOPHAGOGASTRODUODENOSCOPY (EGD) WITH PROPOFOL  N/A 04/01/2021   Procedure: ESOPHAGOGASTRODUODENOSCOPY (EGD) WITH PROPOFOL ;  Surgeon: Aneita Gwendlyn DASEN, MD;  Location: [facility];  Service: Endoscopy;  Laterality: N/A;   HIATAL HERNIA REPAIR  2007; 2009   SAVORY DILATION N/A 04/01/2021   Procedure: SAVORY DILATION;  Surgeon: Aneita Gwendlyn DASEN, MD;  Location: [facility];  Service: Endoscopy;  Laterality: N/A;   TOTAL KNEE ARTHROPLASTY Left 11/03/2015   TOTAL KNEE ARTHROPLASTY Left 11/03/2015   Procedure: Left Total Knee Arthroplasty;  Surgeon: Maude LELON Right, MD;  Location: [facility] OR;  Service: Orthopedics;  Laterality: Left;   TOTAL KNEE ARTHROPLASTY Right 07/17/2018   Procedure: RIGHT TOTAL  KNEE ARTHROPLASTY;  Surgeon: Right Maude LELON, MD;  Location: [facility] OR;  Service: Orthopedics;  Laterality: Right;   VIDEO BRONCHOSCOPY Bilateral 01/31/2013   Procedure: VIDEO BRONCHOSCOPY WITHOUT FLUORO;  Surgeon: Alm KATHEE Nett, MD;  Location: [facility] ENDOSCOPY;  Service: Cardiopulmonary;  Laterality: Bilateral;   Patient Active Problem List   Diagnosis Date Noted   Pain in right leg 05/26/2021   Dysphagia    Gastroesophageal reflux disease    Esophageal stricture    At risk for aspiration 08/07/2019   ILD (interstitial lung disease) (HCC) 06/03/2019   Elevated diaphragm 06/03/2019   Dyspnea on exertion 05/06/2019   Vocal cord atrophy 05/06/2019   History of total right knee replacement 07/17/2018   Primary osteoarthritis of right knee 05/31/2018   History of tuberculosis 11/05/2015   Orthostatic hypotension 11/05/2015   Constipation 11/05/2015   Thrombocytopenia (HCC) 11/05/2015   Normocytic anemia 11/05/2015   Psoriatic arthritis (HCC) 11/03/2015   Essential hypertension 11/03/2015   Osteoarthritis of left knee 11/03/2015   Status post total knee replacement, left 11/03/2015   Psoriasis 01/23/2013   Immunocompromised state (HCC) 01/23/2013    Referral date: 11/16/2023   REFERRING DIAG:  R49.0 (ICD-10-CM) - Dysphonia     THERAPY DIAG: Dysphonia  Rationale for Evaluation and Treatment: Rehabilitation  SUBJECTIVE:   SUBJECTIVE STATEMENT: its going well  Pt accompanied by: significant other  PERTINENT HISTORY: Per ENT documentation, 11/15/2023:  He reports a sudden onset of hoarseness, which has been persistent for the past 3 to 4 months. He does not experience any associated pain during speech or swallowing. His appetite remains unaffected, although he occasionally experiences difficulty swallowing certain foods, particularly dry fruits as well as pills. He does not consume meat and has no issues with liquid intake. He maintains adequate hydration and reports no weight loss.  He was previously seen by laryngology in [facility] and underwent bilateral injection laryngoplasty with Dr. Mirna Cohens for vocal fold paresis. He also was diagnosed with muscle tension dysphonia and underwent speech therapy, with some improvement in symptoms. He has not undergone a swallow study.  He has acid reflux and takes medication intermittently. He is currently on Esbriet  for pulmonary fibrosis and is under the care of a pulmonologist. He does not smoke or drink alcohol .   Impression/Plan: history of muscle tension dysphonia, bilateral true vocal fold paresis, status post bilateral injection laryngoplasty with laryngology in 2016 presenting for evaluation of hoarseness and dysphagia to dried foods and pills. Laryngoscopy performed today with findings as above. I recommend referral to speech-language pathology for consideration of voice therapy, as well as proceeding with barium swallow study to evaluate patient's dysphagia.   PAIN:  Are you having pain? No  FALLS: Has patient fallen in last 6 months? No, Number of falls: 0  LIVING ENVIRONMENT: Lives with: lives with their spouse Lives in: House/apartment  PLOF:Level of assistance: Independent  Employment: Retired  PATIENT GOALS: improved voice, reduced fatigue   OBJECTIVE:  Note: Objective measures were completed at Evaluation unless otherwise noted.  TODAY'S TREATMENT:                                                                                                                   01/26/24: Pt brings IMST device, has been completing at home. Using 10 pt scale for perceived effort, pt rates at 5/10. SLP adjusts device to 24 cm H2O with pt rating at 7/10. Completes a total of 5 sets of 5 reps at this new setting. Completed vocal warm up with SOVTE with feedback provided for coordination of breath and effort to increase clarity and intensity of voice. Then, completed vocal function exercises with ongoing verbal cues provided for  optimal vocal clarity and volume, balancing of respiration and phonation to avoid speaking on residual capacity. Pt initially averaging 73 dB, able to increase to and maintain average of 85 dB with feedback for all sustained phonation tasks. Decreased clarity noted with high and low pitch attempts. Cues provided throughout session for sip and swallow vs throat clear behavior.   01/23/24: Reported completion of HEP. Warm-up conducted with SOVTE, with occasional min A provided to optimize intensity. Difficulty achieving varied pitch exhibited, despite model and max A. Measured MIP across 3 trials (31, 32, 25) with IMST device set to 22 cm H2O. Conducted completion of recommended 25 total reps with rare min A provided to optimize accuracy. Rated final set as 6/10 challenge level. Intermittent throat  clearing evidenced today, with consistent cues provided for hydration versus throat clearing. Mild improvements in vocal intensity and clarity exhibited; however, frequent decline in clarity and volume noted at end of utterances.  01/19/24: SLP led pt through SOVT utilizing straw in water to provide biofeedback. Completed sustained phonation, glides, sirens, and song with occasional model and feedback to optimize completion and identify strained vs clear vocal quality. Pt completed 3x5 sets of inhalation and exhalation straw exercises using breather device to optimize breath support prior to voice exercise completion. SLP introduced vocal function exercises with cued use of powerful and clear voice. Led pt through sustained phonation at mid, low, and high pitches, pitch glides with hold at end. Pt benefits from cues for energized voice. Increased effort results in more consistently clear vocal quality. Provided short phrases for practice using energized voice while speaking with pt demonstrating occasional quality decay, which corrects with cues for maintaining intensity. Challenges evidenced in carryover of strong voice.    01/16/24: Initiated education and instruction of throat clear alternatives, behavioral reflux management, cough suppression (sniff-sniff-blow), and SOVT exercises, with handouts provided. Exhibited throat clears x4 today, with SLP cuing for awareness and redirection to water. Provided occasional mod fading to min A during voice exercises to reduce upper body tension and decrease strain. Benefited from trials of resonant and flow vowels exercises to optimize tactile awareness of oral resonance. Updated HEP for SOVTE.  12/19/23: Initiated education regarding pathophysiology of voice complaints and presentation. Discussed vocal fold atrophy and ventricular phonation resulting in decreased voice quality. Education provided regarding role of breath support on speech. Initiated training for clear speech. Modeled loud, over-articulation, pacing.   PATIENT EDUCATION: Education details: see above Person educated: Patient, Spouse, and Child(ren) Education method: Medical illustrator Education comprehension: verbalized understanding, returned demonstration, and needs further education  HOME EXERCISE PROGRAM: Bring pulm tools next session  GOALS: Goals reviewed with patient? Yes  SHORT TERM GOALS: Target date: 02/13/2024  Pt will teach back vocal hygiene recommendations with mod-I Baseline: Goal status: ONGOING  2.  Pt will demonstrate accuracy during voice exercises targeting vocal fold atrophy and muscle tension dysphonia with modeling and cues from SLP  Baseline:  Goal status: ONGOING  3.  Pt will accurately implement respiratory muscle strength training (inspiratory, expiratory) to optimize breath support for speech  Baseline: has breather device from prior ST course  Goal status: ONGOING  4.  Pt will demonstrate throat clear or cough alternative in 50% of opportunities over 2 sessions  Baseline:  Goal status: ONGOING   LONG TERM GOALS: Target date: 03/12/2024  Pt will  demonstrate carryover of targeted techniques and strategies into 10 minute conversation with improved vocal quality  Baseline:  Goal status: ONGOING  2.  Pt will report ongoing implementation of vocal hygiene techniques over 2 week period  Baseline:  Goal status: ONGOING  3.  Pt will report improvement via PROM by d/c Baseline: Calculated score of 50 at evaluation  Goal status: ONGOING   ASSESSMENT:  CLINICAL IMPRESSION: Patient is a 78 y.o. M who was seen today for voice tx. Evaluation revealed moderate dysphonia. Pt's voice is c/b hoarse, breathy vocal quality with reduced breath support for speech. Pt's presentation is c/w vocal fold atrophy and muscle tension dysphonia. Pt demonstrates fair stimulibility during phrase level reading task for improved vocal quality. In addition to dysphonia, pt also evidences chronic cough and throat clearing. Reports ongoing many years. Druing today's evaluation, pt appeared to have throat clear or cough  response related to voice use (e.g., following extended spoken response or during increased effort when speaking).  I recommend skilled ST to address dysphonia to enhance communication efficacy and QoL.   OBJECTIVE IMPAIRMENTS: include voice disorder. These impairments are limiting patient from effectively communicating at home and in community. Factors affecting potential to achieve goals and functional outcome are time since onset, severity of condition, etiology. Patient will benefit from skilled SLP services to address above impairments and improve overall function.  REHAB POTENTIAL: Good  PLAN:  SLP FREQUENCY: 2x/week  SLP DURATION: 12 weeks  PLANNED INTERVENTIONS: 92507 Treatment of speech (30 or 45 min)  and [id number]- Speech Eval Behavioral Qualitative Voice Resonance  Harlene LITTIE Ned, CCC-SLP 01/26/2024, 8:01 AM

## 2024-01-29 ENCOUNTER — Other Ambulatory Visit (HOSPITAL_COMMUNITY): Payer: Self-pay

## 2024-01-29 NOTE — Telephone Encounter (Signed)
 Alwin has made 3 attempts to reach patient in the past two weeks to schedule pirfenidone  shipments. He has to change pharmacies due to no longer qualifying for manufacturer assistance  I also left VM for patient's daughter Lamona today - her phone went straight to VM but left callback number for Alwin Sherry Pennant, PharmD, MPH, BCPS, CPP Clinical Pharmacist (Rheumatology and Pulmonology)

## 2024-01-31 ENCOUNTER — Other Ambulatory Visit (HOSPITAL_COMMUNITY): Payer: Self-pay

## 2024-01-31 ENCOUNTER — Other Ambulatory Visit: Payer: Self-pay

## 2024-01-31 NOTE — Progress Notes (Signed)
 Specialty Pharmacy Initial Fill Coordination Note  Alan Roman is a 78 y.o. male contacted today regarding initial fill of specialty medication(s) Pirfenidone    Patient requested Delivery   Delivery date: 02/07/24   Verified address: [address]   [name] [address]   Medication will be filled on 02/06/24.   Patient is enrolled into grant and is aware of $0 copayment.

## 2024-02-06 ENCOUNTER — Ambulatory Visit: Attending: Otolaryngology | Admitting: Speech Pathology

## 2024-02-06 DIAGNOSIS — R490 Dysphonia: Secondary | ICD-10-CM | POA: Insufficient documentation

## 2024-02-06 NOTE — Therapy (Signed)
 OUTPATIENT SPEECH LANGUAGE PATHOLOGY VOICE TREATMENT   Patient Name: Alan Roman MRN: [id number] DOB:1945/11/06, 78 y.o., male Today's Date: 02/06/2024  PCP: Dr. Rosamond REFERRING PROVIDER: Dr. Llewellyn   END OF SESSION:   End of Session - 02/06/24 0932     Visit Number 6    Number of Visits 9    Date for SLP Re-Evaluation 03/12/24    Authorization Type BCBS    SLP Start Time 0932    SLP Stop Time  1015    SLP Time Calculation (min) 43 min    Activity Tolerance Patient tolerated treatment well              Past Medical History:  Diagnosis Date   Acute bronchiolitis due to other infectious organisms 01/26/2013   Arthritis    knees (11/03/2015)   Extrapulmonary TB (tuberculosis) 02/02/2013   antibx therapy in 2014   GERD (gastroesophageal reflux disease)    on meds   High risk medication use 12/27/2016   History of hiatal hernia 2007; 2009   Hyperlipidemia    on meds   Hypertension    on meds   Pleural effusion 01/26/2013   Past Surgical History:  Procedure Laterality Date   BIOPSY  04/01/2021   Procedure: BIOPSY;  Surgeon: Aneita Gwendlyn DASEN, MD;  Location: [facility];  Service: Endoscopy;;   CATARACT EXTRACTION W/ INTRAOCULAR LENS  IMPLANT, BILATERAL Bilateral    ESOPHAGOGASTRODUODENOSCOPY (EGD) WITH PROPOFOL  N/A 04/01/2021   Procedure: ESOPHAGOGASTRODUODENOSCOPY (EGD) WITH PROPOFOL ;  Surgeon: Aneita Gwendlyn DASEN, MD;  Location: [facility];  Service: Endoscopy;  Laterality: N/A;   HIATAL HERNIA REPAIR  2007; 2009   SAVORY DILATION N/A 04/01/2021   Procedure: SAVORY DILATION;  Surgeon: Aneita Gwendlyn DASEN, MD;  Location: [facility];  Service: Endoscopy;  Laterality: N/A;   TOTAL KNEE ARTHROPLASTY Left 11/03/2015   TOTAL KNEE ARTHROPLASTY Left 11/03/2015   Procedure: Left Total Knee Arthroplasty;  Surgeon: Maude LELON Right, MD;  Location: [facility] OR;  Service: Orthopedics;  Laterality: Left;   TOTAL KNEE ARTHROPLASTY Right 07/17/2018   Procedure: RIGHT TOTAL  KNEE ARTHROPLASTY;  Surgeon: Right Maude LELON, MD;  Location: [facility] OR;  Service: Orthopedics;  Laterality: Right;   VIDEO BRONCHOSCOPY Bilateral 01/31/2013   Procedure: VIDEO BRONCHOSCOPY WITHOUT FLUORO;  Surgeon: Alm KATHEE Nett, MD;  Location: [facility] ENDOSCOPY;  Service: Cardiopulmonary;  Laterality: Bilateral;   Patient Active Problem List   Diagnosis Date Noted   Pain in right leg 05/26/2021   Dysphagia    Gastroesophageal reflux disease    Esophageal stricture    At risk for aspiration 08/07/2019   ILD (interstitial lung disease) (HCC) 06/03/2019   Elevated diaphragm 06/03/2019   Dyspnea on exertion 05/06/2019   Vocal cord atrophy 05/06/2019   History of total right knee replacement 07/17/2018   Primary osteoarthritis of right knee 05/31/2018   History of tuberculosis 11/05/2015   Orthostatic hypotension 11/05/2015   Constipation 11/05/2015   Thrombocytopenia (HCC) 11/05/2015   Normocytic anemia 11/05/2015   Psoriatic arthritis (HCC) 11/03/2015   Essential hypertension 11/03/2015   Osteoarthritis of left knee 11/03/2015   Status post total knee replacement, left 11/03/2015   Psoriasis 01/23/2013   Immunocompromised state (HCC) 01/23/2013    Referral date: 11/16/2023   REFERRING DIAG:  R49.0 (ICD-10-CM) - Dysphonia     THERAPY DIAG: Dysphonia  Rationale for Evaluation and Treatment: Rehabilitation  SUBJECTIVE:   SUBJECTIVE STATEMENT: Pt enters with breathy voice quality. Reports has done HEP consistently, perceived improvement in voice overall.  Pt accompanied by: significant other  PERTINENT HISTORY: Per ENT documentation, 11/15/2023: He reports a sudden onset of hoarseness, which has been persistent for the past 3 to 4 months. He does not experience any associated pain during speech or swallowing. His appetite remains unaffected, although he occasionally experiences difficulty swallowing certain foods, particularly dry fruits as well as pills. He does not consume meat and  has no issues with liquid intake. He maintains adequate hydration and reports no weight loss. He was previously seen by laryngology in [facility] and underwent bilateral injection laryngoplasty with Dr. Mirna Cohens for vocal fold paresis. He also was diagnosed with muscle tension dysphonia and underwent speech therapy, with some improvement in symptoms. He has not undergone a swallow study.  He has acid reflux and takes medication intermittently. He is currently on Esbriet  for pulmonary fibrosis and is under the care of a pulmonologist. He does not smoke or drink alcohol .   Impression/Plan: history of muscle tension dysphonia, bilateral true vocal fold paresis, status post bilateral injection laryngoplasty with laryngology in 2016 presenting for evaluation of hoarseness and dysphagia to dried foods and pills. Laryngoscopy performed today with findings as above. I recommend referral to speech-language pathology for consideration of voice therapy, as well as proceeding with barium swallow study to evaluate patient's dysphagia.   PAIN:  Are you having pain? No  FALLS: Has patient fallen in last 6 months? No, Number of falls: 0  LIVING ENVIRONMENT: Lives with: lives with their spouse Lives in: House/apartment  PLOF:Level of assistance: Independent  Employment: Retired  PATIENT GOALS: improved voice, reduced fatigue   OBJECTIVE:  Note: Objective measures were completed at Evaluation unless otherwise noted.  TODAY'S TREATMENT:                                                                                                                   02/06/24: Pt completed IMST set at 24 cm H2O with pt completing very easily. SLP adjusts device to 26 dm H2O with pt able to demonstrate 5 sets of 5 for 25 total reps at this new level, rating perceived effort as 7/10. Completed vocal warm up using semi occluded vocal tract exercises, with pt benefiting from cues for effortful voicing to improve clarity of  voice during this exercise.Noted limited pitch variability during glide exercises, c/w performance thus far in therapy. Led pt through high resistance phonation exercises to address vocal fold atrophy for improved vocal quality. Use of ee for sustained phonation exercises. Addressed balance of breath support for increasing length of speaking tasks with pt demonstrating good carryover of increased effort and frequency of breathing resulting in reduced occurrence of breathy vocal quality.   01/26/24: Pt brings IMST device, has been completing at home. Using 10 pt scale for perceived effort, pt rates at 5/10. SLP adjusts device to 24 cm H2O with pt rating at 7/10. Completes a total of 5 sets of 5 reps at this new setting. Completed vocal warm up with SOVTE with feedback provided for coordination of  breath and effort to increase clarity and intensity of voice. Then, completed vocal function exercises with ongoing verbal cues provided for optimal vocal clarity and volume, balancing of respiration and phonation to avoid speaking on residual capacity. Pt initially averaging 73 dB, able to increase to and maintain average of 85 dB with feedback for all sustained phonation tasks. Decreased clarity noted with high and low pitch attempts. Cues provided throughout session for sip and swallow vs throat clear behavior.   01/23/24: Reported completion of HEP. Warm-up conducted with SOVTE, with occasional min A provided to optimize intensity. Difficulty achieving varied pitch exhibited, despite model and max A. Measured MIP across 3 trials (31, 32, 25) with IMST device set to 22 cm H2O. Conducted completion of recommended 25 total reps with rare min A provided to optimize accuracy. Rated final set as 6/10 challenge level. Intermittent throat clearing evidenced today, with consistent cues provided for hydration versus throat clearing. Mild improvements in vocal intensity and clarity exhibited; however, frequent decline in  clarity and volume noted at end of utterances.  01/19/24: SLP led pt through SOVT utilizing straw in water to provide biofeedback. Completed sustained phonation, glides, sirens, and song with occasional model and feedback to optimize completion and identify strained vs clear vocal quality. Pt completed 3x5 sets of inhalation and exhalation straw exercises using breather device to optimize breath support prior to voice exercise completion. SLP introduced vocal function exercises with cued use of powerful and clear voice. Led pt through sustained phonation at mid, low, and high pitches, pitch glides with hold at end. Pt benefits from cues for energized voice. Increased effort results in more consistently clear vocal quality. Provided short phrases for practice using energized voice while speaking with pt demonstrating occasional quality decay, which corrects with cues for maintaining intensity. Challenges evidenced in carryover of strong voice.   01/16/24: Initiated education and instruction of throat clear alternatives, behavioral reflux management, cough suppression (sniff-sniff-blow), and SOVT exercises, with handouts provided. Exhibited throat clears x4 today, with SLP cuing for awareness and redirection to water. Provided occasional mod fading to min A during voice exercises to reduce upper body tension and decrease strain. Benefited from trials of resonant and flow vowels exercises to optimize tactile awareness of oral resonance. Updated HEP for SOVTE.  12/19/23: Initiated education regarding pathophysiology of voice complaints and presentation. Discussed vocal fold atrophy and ventricular phonation resulting in decreased voice quality. Education provided regarding role of breath support on speech. Initiated training for clear speech. Modeled loud, over-articulation, pacing.   PATIENT EDUCATION: Education details: see above Person educated: Patient, Spouse, and Child(ren) Education method: Fish farm manager Education comprehension: verbalized understanding, returned demonstration, and needs further education  HOME EXERCISE PROGRAM: Bring pulm tools next session  GOALS: Goals reviewed with patient? Yes  SHORT TERM GOALS: Target date: 02/13/2024  Pt will teach back vocal hygiene recommendations with mod-I Baseline: Goal status: ONGOING  2.  Pt will demonstrate accuracy during voice exercises targeting vocal fold atrophy and muscle tension dysphonia with modeling and cues from SLP  Baseline:  Goal status: ONGOING  3.  Pt will accurately implement respiratory muscle strength training (inspiratory, expiratory) to optimize breath support for speech  Baseline: has breather device from prior ST course  Goal status: ONGOING  4.  Pt will demonstrate throat clear or cough alternative in 50% of opportunities over 2 sessions  Baseline:  Goal status: ONGOING   LONG TERM GOALS: Target date: 03/12/2024  Pt will demonstrate carryover of targeted  techniques and strategies into 10 minute conversation with improved vocal quality  Baseline:  Goal status: ONGOING  2.  Pt will report ongoing implementation of vocal hygiene techniques over 2 week period  Baseline:  Goal status: ONGOING  3.  Pt will report improvement via PROM by d/c Baseline: Calculated score of 50 at evaluation  Goal status: ONGOING   ASSESSMENT:  CLINICAL IMPRESSION: Patient is a 78 y.o. M who was seen today for voice tx. Evaluation revealed moderate dysphonia. Pt's voice is c/b hoarse, breathy vocal quality with reduced breath support for speech. Pt's presentation is c/w vocal fold atrophy and muscle tension dysphonia. Pt demonstrates fair stimulibility during phrase level reading task for improved vocal quality. In addition to dysphonia, pt also evidences chronic cough and throat clearing. Reports ongoing many years. Druing today's evaluation, pt appeared to have throat clear or cough response related to voice  use (e.g., following extended spoken response or during increased effort when speaking).  I recommend skilled ST to address dysphonia to enhance communication efficacy and QoL.   OBJECTIVE IMPAIRMENTS: include voice disorder. These impairments are limiting patient from effectively communicating at home and in community. Factors affecting potential to achieve goals and functional outcome are time since onset, severity of condition, etiology. Patient will benefit from skilled SLP services to address above impairments and improve overall function.  REHAB POTENTIAL: Good  PLAN:  SLP FREQUENCY: 2x/week  SLP DURATION: 12 weeks  PLANNED INTERVENTIONS: 92507 Treatment of speech (30 or 45 min)  and 07475- Speech Eval Behavioral Qualitative Voice Resonance  Harlene LITTIE Ned, CCC-SLP 02/06/2024, 9:32 AM

## 2024-02-09 ENCOUNTER — Ambulatory Visit: Admitting: Speech Pathology

## 2024-02-09 DIAGNOSIS — R490 Dysphonia: Secondary | ICD-10-CM | POA: Diagnosis not present

## 2024-02-09 NOTE — Therapy (Signed)
 OUTPATIENT SPEECH LANGUAGE PATHOLOGY VOICE TREATMENT   Patient Name: Alan Roman MRN: [id number] DOB:03/01/1946, 78 y.o., male Today's Date: 02/09/2024  PCP: Dr. Rosamond REFERRING PROVIDER: Dr. Llewellyn   END OF SESSION:   End of Session - 02/09/24 0935     Visit Number 7    Number of Visits 9    Date for SLP Re-Evaluation 03/12/24    Authorization Type BCBS    SLP Start Time 0933    SLP Stop Time  1015    SLP Time Calculation (min) 42 min    Activity Tolerance Patient tolerated treatment well              Past Medical History:  Diagnosis Date   Acute bronchiolitis due to other infectious organisms 01/26/2013   Arthritis    knees (11/03/2015)   Extrapulmonary TB (tuberculosis) 02/02/2013   antibx therapy in 2014   GERD (gastroesophageal reflux disease)    on meds   High risk medication use 12/27/2016   History of hiatal hernia 2007; 2009   Hyperlipidemia    on meds   Hypertension    on meds   Pleural effusion 01/26/2013   Past Surgical History:  Procedure Laterality Date   BIOPSY  04/01/2021   Procedure: BIOPSY;  Surgeon: Aneita Gwendlyn DASEN, MD;  Location: [facility];  Service: Endoscopy;;   CATARACT EXTRACTION W/ INTRAOCULAR LENS  IMPLANT, BILATERAL Bilateral    ESOPHAGOGASTRODUODENOSCOPY (EGD) WITH PROPOFOL  N/A 04/01/2021   Procedure: ESOPHAGOGASTRODUODENOSCOPY (EGD) WITH PROPOFOL ;  Surgeon: Aneita Gwendlyn DASEN, MD;  Location: [facility];  Service: Endoscopy;  Laterality: N/A;   HIATAL HERNIA REPAIR  2007; 2009   SAVORY DILATION N/A 04/01/2021   Procedure: SAVORY DILATION;  Surgeon: Aneita Gwendlyn DASEN, MD;  Location: [facility];  Service: Endoscopy;  Laterality: N/A;   TOTAL KNEE ARTHROPLASTY Left 11/03/2015   TOTAL KNEE ARTHROPLASTY Left 11/03/2015   Procedure: Left Total Knee Arthroplasty;  Surgeon: Maude LELON Right, MD;  Location: [facility] OR;  Service: Orthopedics;  Laterality: Left;   TOTAL KNEE ARTHROPLASTY Right 07/17/2018   Procedure: RIGHT TOTAL  KNEE ARTHROPLASTY;  Surgeon: Right Maude LELON, MD;  Location: [facility] OR;  Service: Orthopedics;  Laterality: Right;   VIDEO BRONCHOSCOPY Bilateral 01/31/2013   Procedure: VIDEO BRONCHOSCOPY WITHOUT FLUORO;  Surgeon: Alm KATHEE Nett, MD;  Location: [facility] ENDOSCOPY;  Service: Cardiopulmonary;  Laterality: Bilateral;   Patient Active Problem List   Diagnosis Date Noted   Pain in right leg 05/26/2021   Dysphagia    Gastroesophageal reflux disease    Esophageal stricture    At risk for aspiration 08/07/2019   ILD (interstitial lung disease) (HCC) 06/03/2019   Elevated diaphragm 06/03/2019   Dyspnea on exertion 05/06/2019   Vocal cord atrophy 05/06/2019   History of total right knee replacement 07/17/2018   Primary osteoarthritis of right knee 05/31/2018   History of tuberculosis 11/05/2015   Orthostatic hypotension 11/05/2015   Constipation 11/05/2015   Thrombocytopenia (HCC) 11/05/2015   Normocytic anemia 11/05/2015   Psoriatic arthritis (HCC) 11/03/2015   Essential hypertension 11/03/2015   Osteoarthritis of left knee 11/03/2015   Status post total knee replacement, left 11/03/2015   Psoriasis 01/23/2013   Immunocompromised state (HCC) 01/23/2013    Referral date: 11/16/2023   REFERRING DIAG:  R49.0 (ICD-10-CM) - Dysphonia     THERAPY DIAG: Dysphonia  Rationale for Evaluation and Treatment: Rehabilitation  SUBJECTIVE:   SUBJECTIVE STATEMENT: Pt enters with breathy voice quality. Reports has done HEP consistently, perceived improvement in voice overall.  Pt accompanied by: significant other  PERTINENT HISTORY: Per ENT documentation, 11/15/2023: He reports a sudden onset of hoarseness, which has been persistent for the past 3 to 4 months. He does not experience any associated pain during speech or swallowing. His appetite remains unaffected, although he occasionally experiences difficulty swallowing certain foods, particularly dry fruits as well as pills. He does not consume meat and  has no issues with liquid intake. He maintains adequate hydration and reports no weight loss. He was previously seen by laryngology in [facility] and underwent bilateral injection laryngoplasty with Dr. Mirna Cohens for vocal fold paresis. He also was diagnosed with muscle tension dysphonia and underwent speech therapy, with some improvement in symptoms. He has not undergone a swallow study.  He has acid reflux and takes medication intermittently. He is currently on Esbriet  for pulmonary fibrosis and is under the care of a pulmonologist. He does not smoke or drink alcohol .   Impression/Plan: history of muscle tension dysphonia, bilateral true vocal fold paresis, status post bilateral injection laryngoplasty with laryngology in 2016 presenting for evaluation of hoarseness and dysphagia to dried foods and pills. Laryngoscopy performed today with findings as above. I recommend referral to speech-language pathology for consideration of voice therapy, as well as proceeding with barium swallow study to evaluate patient's dysphagia.   PAIN:  Are you having pain? No  FALLS: Has patient fallen in last 6 months? No, Number of falls: 0  LIVING ENVIRONMENT: Lives with: lives with their spouse Lives in: House/apartment  PLOF:Level of assistance: Independent  Employment: Retired  PATIENT GOALS: improved voice, reduced fatigue   OBJECTIVE:  Note: Objective measures were completed at Evaluation unless otherwise noted.  TODAY'S TREATMENT:                                                                                                                   02/09/24: Address dysphonia via the following: IMST set at 25 cm H2O, 24 repetitions (sets of 8) with mod-I. Pt rating perceived effort at 7/10.  Sustained phonation: ee with SLP cueing for lower belly breathing, avoiding pushing on residual capacity. Sustains for 6 seconds on average prior to loss of clarity. Averages 83 dB with cues for increased  intensity. Usual demonstration of post exercise throat clear, SLP cues for sniff and swallow to avoid this behavior. Pt evidenced use of throat clear alternatives x6 with mod-I following initial education.  High intensity voicing at rote phrase level, pt maintaining 90+ dB with no volume decay or breathiness. Good balance of breath and phonation.  Paragraph reading: maintain clear voicing for 90% of reading, occasional breathiness at end of extended utterance. Improved performance from last session.  Conversation: Pt with usual reverting to breathy voice in conversation. Pt with 70% accuracy in response to questions to address clear, strong voice in connected, generative speech. SLP provides feedback and modeling to enhance carryover.   PATIENT EDUCATION: Education details: see above Person educated: Patient, Spouse, and Child(ren) Education method: Explanation and Demonstration Education comprehension: verbalized understanding,  returned demonstration, and needs further education  HOME EXERCISE PROGRAM: Bring pulm tools next session  GOALS: Goals reviewed with patient? Yes  SHORT TERM GOALS: Target date: 02/13/2024  Pt will teach back vocal hygiene recommendations with mod-I Baseline: Goal status: MET  2.  Pt will demonstrate accuracy during voice exercises targeting vocal fold atrophy and muscle tension dysphonia with modeling and cues from SLP  Baseline:  Goal status: MET  3.  Pt will accurately implement respiratory muscle strength training (inspiratory, expiratory) to optimize breath support for speech  Baseline: has breather device from prior ST course  Goal status: MET  4.  Pt will demonstrate throat clear or cough alternative in 50% of opportunities over 2 sessions  Baseline:  Goal status: ONGOING   LONG TERM GOALS: Target date: 03/12/2024  Pt will demonstrate carryover of targeted techniques and strategies into 10 minute conversation with improved vocal quality  Baseline:   Goal status: ONGOING  2.  Pt will report ongoing implementation of vocal hygiene techniques over 2 week period  Baseline:  Goal status: ONGOING  3.  Pt will report improvement via PROM by d/c Baseline: Calculated score of 50 at evaluation  Goal status: ONGOING   ASSESSMENT:  CLINICAL IMPRESSION: Patient is a 78 y.o. M who was seen today for voice tx. Evaluation revealed moderate dysphonia. Pt's voice is c/b hoarse, breathy vocal quality with reduced breath support for speech. Pt's presentation is c/w vocal fold atrophy and muscle tension dysphonia. Pt demonstrates fair stimulibility during phrase level reading task for improved vocal quality. In addition to dysphonia, pt also evidences chronic cough and throat clearing. Reports ongoing many years. Druing today's evaluation, pt appeared to have throat clear or cough response related to voice use (e.g., following extended spoken response or during increased effort when speaking).  I recommend skilled ST to address dysphonia to enhance communication efficacy and QoL.   OBJECTIVE IMPAIRMENTS: include voice disorder. These impairments are limiting patient from effectively communicating at home and in community. Factors affecting potential to achieve goals and functional outcome are time since onset, severity of condition, etiology. Patient will benefit from skilled SLP services to address above impairments and improve overall function.  REHAB POTENTIAL: Good  PLAN:  SLP FREQUENCY: 2x/week  SLP DURATION: 12 weeks  PLANNED INTERVENTIONS: 92507 Treatment of speech (30 or 45 min)  and 07475- Speech Eval Behavioral Qualitative Voice Resonance  Harlene LITTIE Ned, CCC-SLP 02/09/2024, 9:36 AM

## 2024-02-13 ENCOUNTER — Encounter: Admitting: Speech Pathology

## 2024-02-14 ENCOUNTER — Encounter: Payer: Self-pay | Admitting: Internal Medicine

## 2024-02-14 DIAGNOSIS — D649 Anemia, unspecified: Secondary | ICD-10-CM

## 2024-02-16 ENCOUNTER — Encounter: Admitting: Speech Pathology

## 2024-02-17 NOTE — Telephone Encounter (Signed)
 Please see my phon enot from 01/22/24 sent to [facility]

## 2024-02-22 ENCOUNTER — Other Ambulatory Visit: Payer: Self-pay

## 2024-02-23 ENCOUNTER — Encounter (HOSPITAL_BASED_OUTPATIENT_CLINIC_OR_DEPARTMENT_OTHER): Payer: Self-pay | Admitting: Physician Assistant

## 2024-02-23 ENCOUNTER — Ambulatory Visit (HOSPITAL_BASED_OUTPATIENT_CLINIC_OR_DEPARTMENT_OTHER): Admitting: Physician Assistant

## 2024-02-23 ENCOUNTER — Ambulatory Visit (HOSPITAL_BASED_OUTPATIENT_CLINIC_OR_DEPARTMENT_OTHER)

## 2024-02-23 DIAGNOSIS — M545 Low back pain, unspecified: Secondary | ICD-10-CM | POA: Diagnosis not present

## 2024-02-23 NOTE — Progress Notes (Signed)
 Office Visit Note   Patient: Alan Roman           Date of Birth: Dec 16, 1945           MRN: [id number] Visit Date: 02/23/2024              Requested by: Rosamond Leta NOVAK, MD [address] [place],  [place] [address] PCP: Rosamond Leta NOVAK, MD   Assessment & Plan: Visit Diagnoses:  1. Acute bilateral low back pain without sciatica     Plan: Pleasant 78 year old gentleman with multiple comorbidities comes in today with a history of low back pain without any radicular findings.  He had similar a few years ago and was paced on a Medrol  Dosepak..  Only been going on 3 days.  He has no loss of bowel or bladder control.  He has not tried any medication.  His pain appears to appears to be pretty mild and considering his comorbidities right now I hesitate to put him back on a Medrol  Dosepak.  I tried him talk to him about maybe just trying an arthritis Tylenol  couple times a day and stretching which she knows how to do.  Will follow-up with me if the symptoms get worse.  His hepatic function is fine  Follow-Up Instructions: No follow-ups on file.   Orders:  Orders Placed This Encounter  Procedures   DG Lumbar Spine Complete   No orders of the defined types were placed in this encounter.     Procedures: No procedures performed   Clinical Data: No additional findings.   Subjective: No chief complaint on file.   HPI Patient is a 78 year old gentleman who comes in today with a chief complaint of low back pain x 3 days.  No history of any injuries.  Not taking any medication for this Review of Systems  All other systems reviewed and are negative.    Objective: Vital Signs: There were no vitals taken for this visit.  Physical Exam Constitutional:      Appearance: Normal appearance.  Pulmonary:     Effort: Pulmonary effort is normal.  Skin:    General: Skin is warm and dry.  Neurological:     General: No focal deficit present.     Mental Status: He is alert and oriented to  person, place, and time.     Ortho Exam Exam just focal tenderness over the lower back no step-offs no redness he has excellent strength in the lower extremities with dorsiflexion plantarflexion extension and flexion of his legs no paresthesias Specialty Comments:  No specialty comments available.  Imaging: No results found.   PMFS History: Patient Active Problem List   Diagnosis Date Noted   Low back pain 02/23/2024   Pain in right leg 05/26/2021   Dysphagia    Gastroesophageal reflux disease    Esophageal stricture    At risk for aspiration 08/07/2019   ILD (interstitial lung disease) (HCC) 06/03/2019   Elevated diaphragm 06/03/2019   Dyspnea on exertion 05/06/2019   Vocal cord atrophy 05/06/2019   History of total right knee replacement 07/17/2018   Primary osteoarthritis of right knee 05/31/2018   History of tuberculosis 11/05/2015   Orthostatic hypotension 11/05/2015   Constipation 11/05/2015   Thrombocytopenia (HCC) 11/05/2015   Normocytic anemia 11/05/2015   Psoriatic arthritis (HCC) 11/03/2015   Essential hypertension 11/03/2015   Osteoarthritis of left knee 11/03/2015   Status post total knee replacement, left 11/03/2015   Psoriasis 01/23/2013   Immunocompromised state (HCC) 01/23/2013  Past Medical History:  Diagnosis Date   Acute bronchiolitis due to other infectious organisms 01/26/2013   Arthritis    knees (11/03/2015)   Extrapulmonary TB (tuberculosis) 02/02/2013   antibx therapy in 2014   GERD (gastroesophageal reflux disease)    on meds   High risk medication use 12/27/2016   History of hiatal hernia 2007; 2009   Hyperlipidemia    on meds   Hypertension    on meds   Pleural effusion 01/26/2013    Family History  Problem Relation Age of Onset   Colon cancer Neg Hx    Stomach cancer Neg Hx    Esophageal cancer Neg Hx    Colon polyps Neg Hx    Rectal cancer Neg Hx     Past Surgical History:  Procedure Laterality Date   BIOPSY  04/01/2021    Procedure: BIOPSY;  Surgeon: Aneita Gwendlyn DASEN, MD;  Location: [facility];  Service: Endoscopy;;   CATARACT EXTRACTION W/ INTRAOCULAR LENS  IMPLANT, BILATERAL Bilateral    ESOPHAGOGASTRODUODENOSCOPY (EGD) WITH PROPOFOL  N/A 04/01/2021   Procedure: ESOPHAGOGASTRODUODENOSCOPY (EGD) WITH PROPOFOL ;  Surgeon: Aneita Gwendlyn DASEN, MD;  Location: [facility];  Service: Endoscopy;  Laterality: N/A;   HIATAL HERNIA REPAIR  2007; 2009   SAVORY DILATION N/A 04/01/2021   Procedure: SAVORY DILATION;  Surgeon: Aneita Gwendlyn DASEN, MD;  Location: [facility];  Service: Endoscopy;  Laterality: N/A;   TOTAL KNEE ARTHROPLASTY Left 11/03/2015   TOTAL KNEE ARTHROPLASTY Left 11/03/2015   Procedure: Left Total Knee Arthroplasty;  Surgeon: Maude LELON Right, MD;  Location: [facility] OR;  Service: Orthopedics;  Laterality: Left;   TOTAL KNEE ARTHROPLASTY Right 07/17/2018   Procedure: RIGHT TOTAL KNEE ARTHROPLASTY;  Surgeon: Right Maude LELON, MD;  Location: [facility] OR;  Service: Orthopedics;  Laterality: Right;   VIDEO BRONCHOSCOPY Bilateral 01/31/2013   Procedure: VIDEO BRONCHOSCOPY WITHOUT FLUORO;  Surgeon: Alm KATHEE Nett, MD;  Location: [facility] ENDOSCOPY;  Service: Cardiopulmonary;  Laterality: Bilateral;   Social History   Occupational History   Not on file  Tobacco Use   Smoking status: Never   Smokeless tobacco: Never  Vaping Use   Vaping status: Never Used  Substance and Sexual Activity   Alcohol  use: No   Drug use: No   Sexual activity: Not on file

## 2024-02-26 ENCOUNTER — Other Ambulatory Visit (HOSPITAL_COMMUNITY)
Admission: RE | Admit: 2024-02-26 | Discharge: 2024-02-26 | Disposition: A | Discharge status: Home / Self Care | Source: Ambulatory Visit | Attending: Internal Medicine | Admitting: Internal Medicine

## 2024-02-26 ENCOUNTER — Other Ambulatory Visit (INDEPENDENT_AMBULATORY_CARE_PROVIDER_SITE_OTHER)

## 2024-02-26 ENCOUNTER — Other Ambulatory Visit

## 2024-02-26 DIAGNOSIS — D649 Anemia, unspecified: Secondary | ICD-10-CM | POA: Diagnosis not present

## 2024-02-26 LAB — CBC WITH DIFFERENTIAL/PLATELET
Basophils Absolute: 0 K/uL (ref 0.0–0.1)
Basophils Relative: 0.5 % (ref 0.0–3.0)
Eosinophils Absolute: 0.1 K/uL (ref 0.0–0.7)
Eosinophils Relative: 2.8 % (ref 0.0–5.0)
HCT: 41.3 % (ref 39.0–52.0)
Hemoglobin: 14.1 g/dL (ref 13.0–17.0)
Lymphocytes Relative: 28.3 % (ref 12.0–46.0)
Lymphs Abs: 1.4 K/uL (ref 0.7–4.0)
MCHC: 34.2 g/dL (ref 30.0–36.0)
MCV: 97.8 fl (ref 78.0–100.0)
Monocytes Absolute: 0.6 K/uL (ref 0.1–1.0)
Monocytes Relative: 12.4 % — ABNORMAL HIGH (ref 3.0–12.0)
Neutro Abs: 2.7 K/uL (ref 1.4–7.7)
Neutrophils Relative %: 56 % (ref 43.0–77.0)
Platelets: 156 K/uL (ref 150.0–400.0)
RBC: 4.23 Mil/uL (ref 4.22–5.81)
RDW: 13.2 % (ref 11.5–15.5)
WBC: 4.8 K/uL (ref 4.0–10.5)

## 2024-02-27 LAB — IRON,TIBC AND FERRITIN PANEL
%SAT: 49 % — ABNORMAL HIGH (ref 20–48)
Ferritin: 41 ng/mL (ref 24–380)
Iron: 158 ug/dL (ref 50–180)
TIBC: 321 ug/dL (ref 250–425)

## 2024-02-29 ENCOUNTER — Other Ambulatory Visit (HOSPITAL_COMMUNITY): Payer: Self-pay

## 2024-03-03 ENCOUNTER — Other Ambulatory Visit: Payer: Self-pay | Admitting: Cardiology

## 2024-03-03 DIAGNOSIS — I10 Essential (primary) hypertension: Secondary | ICD-10-CM

## 2024-03-04 ENCOUNTER — Other Ambulatory Visit: Payer: Self-pay

## 2024-03-04 NOTE — Progress Notes (Signed)
 Specialty Pharmacy Refill Coordination Note  Alan Roman is a 78 y.o. male contacted today regarding refills of specialty medication(s) Pirfenidone    Patient requested Delivery   Delivery date: 03/25/24   Verified address: [address]   [place] [phone]   Medication will be filled on 08.22.25.

## 2024-03-08 NOTE — Telephone Encounter (Signed)
 advise

## 2024-03-08 NOTE — Telephone Encounter (Signed)
 Can esbriet  be cut in half,was told to ask pharmacy for clarifcation

## 2024-03-12 ENCOUNTER — Encounter: Payer: Self-pay | Admitting: Speech Pathology

## 2024-03-18 NOTE — Telephone Encounter (Signed)
   I th ink it is best he move from the 801mg  tablet to 267mg  three tablets x tid    Current Outpatient Medications:    aspirin  (ASPIRIN  CHILDRENS) 81 MG chewable tablet, Chew 1 tablet (81 mg total) by mouth daily., Disp: , Rfl:    Ferrous Fumarate (HEMOCYTE - 106 MG FE) 324 (106 Fe) MG TABS tablet, Take by mouth., Disp: , Rfl:    losartan  (COZAAR ) 50 MG tablet, TAKE 1 TABLET BY MOUTH DAILY GENERIC EQUIVALENT FOR COZAAR , Disp: 90 tablet, Rfl: 3   methotrexate  2.5 MG tablet, Take 3 tablets by mouth once a week., Disp: , Rfl:    multivitamin-iron-minerals-folic acid  (CENTRUM) chewable tablet, Chew 1 tablet by mouth daily., Disp: , Rfl:    pantoprazole  (PROTONIX ) 20 MG tablet, Take 1 tablet (20 mg total) by mouth daily as needed., Disp: 90 tablet, Rfl: 3   Pirfenidone  801 MG TABS, Take 1 tablet (801 mg total) by mouth with breakfast, with lunch, and with evening meal., Disp: 810 tablet, Rfl: 1   rosuvastatin  (CRESTOR ) 5 MG tablet, TAKE 1 TABLET BY MOUTH EVERY DAY, Disp: 90 tablet, Rfl: 2   Triamcinolone  Acetonide (TRIAMCINOLONE  0.1 % CREAM : EUCERIN) CREA, Apply 1 application topically 2 (two) times daily., Disp: 1 each, Rfl: 0

## 2024-03-22 ENCOUNTER — Other Ambulatory Visit: Payer: Self-pay

## 2024-03-25 ENCOUNTER — Other Ambulatory Visit: Payer: Self-pay

## 2024-04-03 ENCOUNTER — Other Ambulatory Visit: Payer: Self-pay | Admitting: Cardiology

## 2024-04-03 ENCOUNTER — Other Ambulatory Visit: Payer: Self-pay

## 2024-04-03 DIAGNOSIS — I2510 Atherosclerotic heart disease of native coronary artery without angina pectoris: Secondary | ICD-10-CM

## 2024-04-03 DIAGNOSIS — E785 Hyperlipidemia, unspecified: Secondary | ICD-10-CM

## 2024-04-10 ENCOUNTER — Ambulatory Visit: Admitting: Internal Medicine

## 2024-04-10 DIAGNOSIS — J84112 Idiopathic pulmonary fibrosis: Secondary | ICD-10-CM | POA: Diagnosis not present

## 2024-04-10 DIAGNOSIS — Z7189 Other specified counseling: Secondary | ICD-10-CM

## 2024-04-10 LAB — PULMONARY FUNCTION TEST
DL/VA % pred: 89 %
DL/VA: 3.93 ml/min/mmHg/L
DLCO cor % pred: 44 %
DLCO cor: 12.61 ml/min/mmHg
DLCO unc % pred: 43 %
DLCO unc: 12.43 ml/min/mmHg
FEF 25-75 Pre: 3.33 L/s
FEF2575-%Pred-Pre: 182 %
FEV1-%Pred-Pre: 74 %
FEV1-Pre: 1.84 L
FEV1FVC-%Pred-Pre: 115 %
FEV6-%Pred-Pre: 63 %
FEV6-Pre: 2.1 L
FEV6FVC-%Pred-Pre: 97 %
FVC-%Pred-Pre: 64 %
FVC-Pre: 2.1 L
Pre FEV1/FVC ratio: 88 %
Pre FEV6/FVC Ratio: 100 %

## 2024-04-10 NOTE — Progress Notes (Unsigned)
 NOV 2020 with Dr Jude  78 year old never smoker of [place] origin initial consult 05/2019  for evaluation of hoarseness, dry cough and congestion. He is originally from [place], [place], [place]-immigrated to the [place]  in [place] for his bachelors to [place] and then settled in [place] in 1978 and got into [employer] business.  PMH - - psoriatic arthritis by Dr. Alm Coaster, was originally on Humira and is now on Cosentyx . - latent TB that was untreated initially and in 12/2012 he presented with a mild alveolitis on CT scan and pleural effusion that required thoracentesis, bronchoscopy was negative for AFB but he was treated under the guidance of local health department and ID with multiple drug antituberculous treatment for 3 months.  His problem lists extrapulmonary TB but on my review all AFB cultures were negative.   -07/2018 chest x-ray shows eventration of right hemidiaphragm.   - vocal cord paralysis in 04/2015 and underwent ENT evaluation for persistent dysphonia and underwent gel injection of the vocal cords at [facility] without any relief  Chief Complaint  Patient presents with   Follow-up    1 month rov- review CT chest 10/31.  pt states his s/s are unchanged since last month, prod cough with clear mucus qam that subsides throughout the day.      Symptoms are persistent. Main issues are hoarseness, dry cough and congestion  We reviewed HRCT in detail today  CT angiogram 10/2015 -which shows mild reticular densities suggestive but not diagnostic of ILD.  HRCT 05/31/19 mild, bibasilar predominant pulmonary fibrosis in a pattern featuring subtle subpleural reticular interstitial opacity, mild traction bronchiectasis and occasional areas of Bronchiolectasis - ' probable UIP ' - slight worse   OV Alan [facility] Jan 2021  78 year old male never smoker presenting to our office today after completing pulmonary function testing.  Patient was last seen in our office in October/2020  for initial consult with Dr. Jude.  At that office visit patient was scheduled for pulmonary function testing, lab work was completed to further evaluate patient's probable UIP on October/2020 high-resolution CT chest, patient was referred to ear nose and throat, swallow studies were also ordered on the patient.  Patient completed ENT referral in 07/02/2019 with Dr. Solomon Recommendations for voice therapy, that he follow-up with gastroenterology regarding this Schatzki's ring.  Follow-up in April.  Proceed forward with pulmonary pulmonary function test.  Patient reports that he is received a telephone call to restart voice rehab but he has not done this yet as he wanted to review pulmonary function testing with our office first.  Patient swallow studies are listed below:  06/06/2019-swallow study-mild aspiration risk, no treatment recommended at this time 06/07/2019-barium swallow-Schatzki ring at the GE junction which prevents passage of a 13 mm barium tablet  Patient has a gastroenterologist Dr. Kristie but he has not seen her in over a year.  Patient has no scheduled upcoming appointments.  Patient continues to follow-up with rheumatology, Dr. Dolphus.  Per chart review last office note was in July/2020.  Patient presenting today to review December/2020 pulmonary function test.  Those results are listed below:  07/19/2019-pulmonary function test-FVC 2.32 (63% predicted), postbronchodilator ratio 93, postbronchodilator FEV1 1.93 (69% predicted), DLCO 13.59 (43% predicted) >>>Severe restriction, moderately severe diffusion defect  Lab work from November/2020 are also listed below:  06/03/2019-ANA-1: 40, nuclear homogenous 06/03/2019-rheumatoid factor-14 06/03/2019-anti-Jo 1-less than 0.2 06/03/2019-aldolase-4.9 06/03/2019-centromere antibodies-negative 06/03/2019-antiscleroderma antibody-less than 1 - 06/03/2019-Sjogren's syndrome 8 and B-negative 06/03/2019-hypersensitivity  pneumonitis-negative 06/03/2019-sed rate  22  Mildly elevated ANA and rheumatoid factor.  Felt to be not clinically significant.  Rest of results are negative.  Patient continues to report that he is asymptomatic.  Patient tolerates walk in office without any oxygen desaturations.  He does continue to have occasional bouts of congestion which is believed to be related to the Cosentyx .  Family has messaged rheumatology regarding this.  Tests:   07/19/2019-pulmonary function test-FVC 2.32 (63% predicted), postbronchodilator ratio 93, postbronchodilator FEV1 1.93 (69% predicted), DLCO 13.59 (43% predicted) >>>Severe restriction, moderately severe diffusion defect  07/16/2019-SARS-CoV-2-not detected  06/03/2019-ANA-1: 40, nuclear homogenous 06/03/2019-rheumatoid factor-14 06/03/2019-anti-Jo 1-less than 0.2 06/03/2019-aldolase-4.9 06/03/2019-centromere antibodies-negative 06/03/2019-antiscleroderma antibody-less than 1 - 06/03/2019-Sjogren's syndrome 8 and B-negative 06/03/2019-hypersensitivity pneumonitis-negative 06/03/2019-sed rate 22  06/01/2019-CT chest high-res mild, bibasilar predominant pulmonary fibrosis in a pattern with featuring subtle subpleural reticular interstitial opacity and mild traction bronchiectasis occasional areas of bronchiolectasis, probable UIP  06/06/2019-swallow study-mild aspiration risk, no treatment recommended at this time  06/07/2019-barium swallow-Schatzki ring at the GE junction which prevents passage of a 13 mm barium tablet  FENO:  No results found for: NITRICOXIDE  MH: Psoriatic arthritis (Dr. Dolphus - prev on Humira, now on Cosentyx ), latent TB that was untreated initially in 12/2012 with mild alveolitis on CT scan and pleural effusion that required thoracentesis, bronchoscopy was negative for AFB but he was treated under guidance with local health department infectious disease with multidrug antituberculosis treatment for 3 months, eventration of right  hemidiaphragm, vocal cord paralysis in September/2016 underwent ENT evaluation for persistent dysphonia and underwent gel injection of vocal cords at [facility] without any relief Smoker/ Smoking History: Never smoker Maintenance: None   He is originally from [place], [place] to [place]  in [place] for his bachelors to [place] and then settled in [place] [employer] in 1978 and got into [employer] business.    OV 11/10/2020  Subjective:  Patient ID: Alan [facility], male , DOB: April 09, 1946 , age 27 y.o. , MRN: [id number] , ADDRESS: [address] [place] [address] PCP Rosamond Leta NOVAK, MD Patient Care Team: Rosamond Leta NOVAK, MD as PCP - General (Internal Medicine)  This Provider for this visit: Treatment Team:  Attending Provider: Geronimo Amel, MD    11/10/2020 -   Chief Complaint  Patient presents with   Follow-up    Increased coughing for past 3 months, sometimes productive with whitish sputum. Denies shortness of breath.      HPI Alan [facility] 78 y.o. -asked by Dr. Ladona to establish at the pulmonary fibrosis Roman.  Therefore patient is seeing Dr. Geronimo.  Patient used to be previously followed by Dr. Jude.  Last seen by Dr. Alva in 2020.  Then seen by nurse practitioner Alan [facility] in January 2021.   His daughtre Lamona later joined on the phone call.  Patient is known to have ILD probable UIP pattern.  Is on observation at this point.   [facility] Integrated Comprehensive ILD Questionnaire  Symptoms:  -   he reports insidious onset of dyspnea for the last 4 months.  Since it started it is the same.  Although his ILD is present even before that.  He says that he has a very mild cough however in the last 4 to 6 months it is worse.  Particularly when he talks for a long time.  He does not wake up in the middle of the night because of the cough he does bring up some white sputum that is very light white.  Cough does  not affect his voice.  He does clear his  throat.  He has never had hemoptysis.  He does not cough when he lies down.  No nausea vomiting or diarrhea.  No wheezing.  Cough does get worse with eating  Past Medical History : He has psoriatic arthritis.  He is immunosuppressed and is on immunomodulators.  Currently on Simponi .  He does not have rheumatoid arthritis.  No heart failure.  No other collagen vascular disease of connective tissue disease.  No COPD.  No asthma.  No HIV.  No hiatal hernia.  No pulmonary hypertension.  No diabetes.  No thyroid  disease.  No stroke no seizures.  No tuberculosis no kidney disease no pneumonia.  No blood clots no heart disease no pleurisy.   He is deemed to be an aspiration risk he has vocal cord paralysis.  He is to see Dr. Kristie [facility].  He wants to switch to [facility] but because of the pandemic has not been able to establish.  ROS: He does not have any fatigue.  No arthralgia.  No  dysphagia.  No Raynaud's.  No dry eyes no fever no weight loss.  No nausea no vomiting.  No heartburn no snoring no rash no ulcers.   FAMILY HISTORY of LUNG DISEASE: No pulmonary fibrosis.  No COPD.  No asthma no sarcoid no cystic fibrosis no hypersensitive pneumonitis no autoimmune disease in the family.    EXPOSURE HISTORY: No tobacco.  No smoking cigars no smoking pipes.  No electronic cigarette use.  No vaping marijuana.  No cocaine use.  No intravenous drug use.   HOME and HOBBY DETAILS : He lives in a 79 year old family home.  He is lived there for 40 years.  No damp environment.  No mold or mildew.  No humidifier use no CPAP use no nebulizer use.  No steam iron use.  No Jacuzzi use.  No misting Fountain.  No pet birds or parakeets.  No feather pillows or blankets.  No mold in the [facility] duct.  No music habits no gardening habits.  Never exposed to birds or chickens.  No water damage in the basement.  No straw meds.  No hot tub use.  No animals at work.    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  Organic antigen exposure history is negative.  Inorganic antigen exposure history is negative.   PULMONARY TOXICITY HISTORY (27 items):  Simponi  for psoriatic arthritis      .OV 12/18/2020  Subjective:  Patient ID: Alan [facility], male , DOB: 11-Nov-1945 , age 49 y.o. , MRN: [id number] , ADDRESS: [address] [place] [address] PCP Rosamond Leta NOVAK, MD Patient Care Team: Rosamond Leta NOVAK, MD as PCP - General (Internal Medicine) Ladona Heinz, MD as PCP - Cardiology (Cardiology)  This Provider for this visit: Treatment Team:  Attending Provider: Geronimo Amel, MD    12/18/2020 -   Chief Complaint  Patient presents with   Follow-up    PFT performed today, HRCT performed 4/25, and EGD scheduled 5/23.  Pt states he has been okay since last visit. States his only concern is hoarseness with voice and an occ cough.     HPI [facility] 78 y.o. -returns for follow-up to discuss his test results.  He continues to have hoarse voice.  He denies any dysphagia.  He is now scheduled for endoscopy 12/21/2020.  He is going to see cardiology ahead of  that.  His serologies negative.  His symptoms continue to be minimal and stable.  He had high-resolution CT scan of the chest that shows progression compared to October 2020.  It is described as probable UIP.  Back in October 2020 there was craniocaudal gradient described and no air trapping.  This time April 2022 radiologist feels there is slight upper lobe predominance and there is air trapping.  This latter description raises the question of possible chronic hypersensitive pneumonitis even though the final conclusion was probable UIP.  I went over exposure history with him again.  No organic antigen exposure history identified.  Nevertheless disease appears progressive.    CT chest HIgh res 11/23/20   IMPRESSION: CLINICAL DATA:  78 year old male with history of bronchiectasis. Follow-up study.   EXAM: CT  CHEST WITHOUT CONTRAST   TECHNIQUE: Multidetector CT imaging of the chest was performed following the standard protocol without intravenous contrast. High resolution imaging of the lungs, as well as inspiratory and expiratory imaging, was performed.   COMPARISON:  Chest CT 05/31/2019.   FINDINGS: Cardiovascular: Heart size is normal. There is no significant pericardial fluid, thickening or pericardial calcification. There is aortic atherosclerosis, as well as atherosclerosis of the great vessels of the mediastinum and the coronary arteries, including calcified atherosclerotic plaque in the left main, left anterior descending and left circumflex coronary arteries.   Mediastinum/Nodes: No pathologically enlarged mediastinal or hilar lymph nodes. Please note that accurate exclusion of hilar adenopathy is limited on noncontrast CT scans. Esophagus is unremarkable in appearance. No axillary lymphadenopathy.   Lungs/Pleura: High-resolution images demonstrate widespread patchy regions of ground-glass attenuation, septal thickening, mild cylindrical bronchiectasis and peripheral bronchiolectasis. Findings have no discernible craniocaudal gradient, and there is significant involvement in the anterior aspects of the upper lobes of the lungs bilaterally. No frank honeycombing. Inspiratory and expiratory imaging demonstrates some mild air trapping indicative of mild small airways disease. Minimal progression compared to the prior examination. No acute consolidative airspace disease. No pleural effusions. No suspicious appearing pulmonary nodules or masses are noted.   Upper Abdomen: Aortic atherosclerosis. 5 mm calcified gallstone in the neck of the gallbladder. Small calcified granuloma in the spleen incidentally noted.   Musculoskeletal: There are no aggressive appearing lytic or blastic lesions noted in the visualized portions of the skeleton 1. The appearance of the lungs is  compatible with interstitial lung disease, with a spectrum of findings once again categorized as probable usual interstitial pneumonia (UIP) per current ATS guidelines. Minimal progression of disease compared to the prior examination. 2. Aortic atherosclerosis, in addition to left main and 2 vessel coronary artery disease. Assessment for potential risk factor modification, dietary therapy or pharmacologic therapy may be warranted, if clinically indicated. 3. Cholelithiasis.   Aortic Atherosclerosis (ICD10-I70.0).     Electronically Signed   By: Toribio Aye M.D.   On: 11/24/2020 08:04   xxxx Results for Garin, Kaesyn [facility] (MRN [id number]) as of 12/18/2020 16:47  Ref. Range 06/03/2019 10:42  Aspergillus Fumigatus Latest Ref Range: NEGATIVE  NEGATIVE  Pigeon Serum Latest Ref Range: NEGATIVE  NEGATIVE  Anti Nuclear Antibody (ANA) Latest Ref Range: NEGATIVE  POSITIVE (A)  ANA Pattern 1 Unknown Nuclear, Homogeneous (A)  ANA Titer 1 Latest Units: titer 1:40 (H)  Anti JO-1 Latest Ref Range: 0.0 - 0.9 AI <0.2  CENTROMERE AB SCREEN Latest Ref Range: <1.0 NEG AI <1.0 NEG  RA Latex Turbid. Latest Ref Range: <14 IU/mL 14 (H)  SSA (Ro) (ENA) Antibody, IgG Latest Ref Range: <  1.0 NEG AI <1.0 NEG  SSB (La) (ENA) Antibody, IgG Latest Ref Range: <1.0 NEG AI <1.0 NEG  Scleroderma (Scl-70) (ENA) Antibody, IgG Latest Ref Range: <1.0 NEG AI <1.0 NEG    OV 01/21/2021  Subjective:  Patient ID: Alan [facility], male , DOB: 09/06/1945 , age 85 y.o. , MRN: [id number] , ADDRESS: [address] [place] [address] PCP Rosamond Leta NOVAK, MD Patient Care Team: Rosamond Leta NOVAK, MD as PCP - General (Internal Medicine) Ladona Heinz, MD as PCP - Cardiology (Cardiology)  This Provider for this visit: Treatment Team:  Attending Provider: Geronimo Amel, MD    01/21/2021 -   Chief Complaint  Patient presents with   Follow-up    Pt states he has been doing okay since last visit.  States that he has not started on the OFEV  medication yet.     HPI [facility] 78 y.o. -presents for follow-up in this visit he was supposed to have started nintedanib by now.  However he is yet to start this.  There are  supply chain issues.  But he says it is approved.  Overall no change in symptoms.  Unclear if he is finished his GI work-up.  He continues to have some hoarse voice.  He has not seen ENT.  He is accepted my recommendation to have a referral.  He did asked me whether a few episode of chlorine exposure acutely 40 years ago could have caused his pulmonary fibrosis.  I told him while it was possible it is unlikely.  Unclear about single episode although possible as be seen with COVID   02/18/2021 Patient contacted today for 4 week follow-up. He started OFEV  anti-fibrotic on June 30th. He developed headache and diarrhea after 2 days of starting medication. He stopped taking OFEV  4 days ago d/t GI symptoms and fatigue. He also noted that his blood pressure was elevated. His symptoms have resolved since stopping medication.    MDD confderence Sept 2022  Dr Leonce - November 23, 2020 - HRCT - compared to CTA 2017 and HRCT 2020. Recent CT  moderage patchy retic, and some GGO. TB is mild . Findings are distriubted evenly - no CCG.  No frank HC. clearly tehre is fibrotic ILD. Air trapping is ver minimal and in lower lobes and is physiologic and not clinicall signifcant. No AT in upper lobe or middle lobe/lingulare   Final DX: Prob UIP and ther is mild progression since 2020 and defintiey since 2017.   Clinical dx:  IPF and Rx as such   OV 04/20/2021  Subjective:  Patient ID: Alan [facility], male , DOB: Jul 16, 1946 , age 2 y.o. , MRN: [id number] , ADDRESS: [address] [place] [address] PCP Rosamond Leta NOVAK, MD Patient Care Team: Rosamond Leta NOVAK, MD as PCP - General (Internal Medicine) Ladona Heinz, MD as PCP - Cardiology (Cardiology)  This Provider for this visit:  Treatment Team:  Attending Provider: Geronimo Amel, MD    04/20/2021 -   Chief Complaint  Patient presents with   Follow-up    Pt states his BP is still increasing even after going to 1 pill of the OFEV  a day.   IPF: age greater than 65, Velcro crackles at the base, previous description of probable UIP and nearly negative serology [psoriatic arthritis does not count is connective tissue disease ILD], vocal cord paralysis with some aspiration risk that this is IPF.   - Dx given June 2022 - Confirmd MDD conferne Sept 2022   -  Ofev  June 2022  HPI [facility] 78 y.o. -presents for follow-up.  I am meeting his wife for the first time.  He feels he is stable.  He is walking 30 minutes in the mornings and he can do this without stopping.  He says his dyspnea is not worse and his cough is not worse but objectively on the subjective symptom questionnaire there might be progression.  We did discuss his case in the ILD conference.  The diagnosis is IPF.  There is progression.  In terms of therapeutics we started him on nintedanib at 150 mg twice daily but he called saying that he was intolerant to it because of hypertension [anecdotally he is my first patient with hypertension in 6 years with nintedanib] but also diarrhea and fatigue.  He then stopped it and apparently his blood pressure normalized.  Then 04/10/2021 he started himself on nintedanib 100 mg strength.  He is currently doing only 1 time daily.  He feels his blood pressure is going up again to 140 systolic.  [At 150 twice a day it was at 150 systolic] and this is no normal.  He is not having any other symptoms.  We discussed the option of switching to pirfenidone  and going through approval process again.  We discussed the side effects of skin issues, fatigue, nausea, anorexia and ongoing liver monitoring with significant overlap with nintedanib side effects.  At this point in time he is resolved to increase his nintedanib to 100 mg  twice daily and if he is tolerating well and that go to 150 mg twice daily.  If this fails then he will go to pirfenidone .  End of November 2022 he is going to go to [place] till February 2022.  He will have a high-dose flu shot today.         OV 06/07/2021  Subjective:  Patient ID: Alan [facility], male , DOB: March 31, 1946 , age 64 y.o. , MRN: [id number] , ADDRESS: [address] [place] [address] PCP Rosamond Leta NOVAK, MD Patient Care Team: Rosamond Leta NOVAK, MD as PCP - General (Internal Medicine) Ladona Heinz, MD as PCP - Cardiology (Cardiology)  This Provider for this visit: Treatment Team:  Attending Provider: Geronimo Amel, MD    06/07/2021 -   Chief Complaint  Patient presents with   Follow-up    4wk f/u after starting Ofev . Stopped the Ofev  about 3 weeks ago due to increased blood pressure. Plans to restart it next Monday. Denies any increased nausea, diarrhea or loss of appetite.     HPI [facility] 78 y.[facility]. -returns for his IPF follow-up.  This particular visit is to focus on how he is tolerating his nintedanib.  He is extremely concerned his nintedanib is causing hypertension.  When he was taking 100 mg which is the lower dose at 1 pill/day he did not have hypertension.  He then increase it to 100 mg twice daily [this is the lowest therapeutic dose].  He says this caused his blood pressure systolic to go up to 140s.  He has since stopped taking all nintedanib.  His blood pressure is now normal.  He does not want take nintedanib anymore.  We discussed the alternative of taking pirfenidone .  He is open to this idea.  Pirfenidone  is not known to cause hypertension.  In fact even at nintedanib I am not seen any patient developed hypertension except him.  We discussed the other side effects of pirfenidone  including GI issues, weight loss, nausea, liver  function test monitoring and necessity for sunscreen.  He is willing to do all this.  He has upcoming travel to  [place] towards the end of November 2022 through February 2023.  We explained that not taking the antifibrotic but somewhat less than 5% chance of progression over 3 months.  He does not want to take this risk because of prior progression.  He just wants to take the medication pirfenidone .  So we will make a request for this.  I did advise him to register himself with a pulmonologist and his home city in [place].  I also spoke to his daughter over the phone.  They will arrange this.  He is willing to do video and telephone visit with me when he is in [place].  Of note he developed back pain with right-sided sciatica 3 weeks ago.  He has right-sided sciatica.  He had an MRI that shows DJD findings.  He will follow-up with Dr. Anderson orthopedics for this.  He and his wife and daughter merlin on the phone] feel that he may not make it [place] because of the back pain.  I advised her to talk about this with Dr. Anderson.  Of note he has upcoming pulmonary function test appointment in 1 week.  He also has office visit.  We discussed this with him.  He will reschedule his office visit as a televisit to discuss his pirfenidone  uptake and his pulmonary function test combined.  He will do this from [place].      OV 06/15/2021  Subjective:  Patient ID: HYDE SIRES, male , DOB: Nov 22, 1945 , age 37 y.o. , MRN: [id number] , ADDRESS: [address] [place] [address] PCP Rosamond Leta NOVAK, MD Patient Care Team: Rosamond Leta NOVAK, MD as PCP - General (Internal Medicine) Ladona Heinz, MD as PCP - Cardiology (Cardiology)  This Provider for this visit: Treatment Team:  Attending Provider: Geronimo Amel, MD    06/15/2021 -   Chief Complaint  Patient presents with   Follow-up    Pft done today. No new concerns     HPI Alan [facility] 78 y.o. -returns for follow-up.  His wife is here with him.  His symptom score shows minimal symptoms and is actually better.  His pulmonary function test shows  slight worsening compared to 2 years ago.  But he is not feeling this.  He is off his nintedanib and is feeling better.  He is decided to start pirfenidone .  I did indicate to him that his progression.  He is here to start.  Pirfenidone  because although approved the shipment is pending.  I sent a message to the pharmacist to see when his shipment would arrive.  His weight is stable.  He is doing some physical therapy for his back.  He is going to see Dr. Anderson tomorrow.  He will register with a  pulmonologist in [place].  While in [place] once his [place]  pirfenidone  runs he will get local pirfenidone .  He will get this through his pulmonologist there.       OV 05/17/2022  Subjective:  Patient ID: Alan Roman, male , DOB: [birth year] , age 45 y.o. , MRN: [id number] , ADDRESS: [address] [place] [address] PCP Rosamond Leta NOVAK, MD Patient Care Team: Rosamond Leta NOVAK, MD as PCP - General (Internal Medicine) Ladona Heinz, MD as PCP - Cardiology (Cardiology)  This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

## 2024-04-10 NOTE — Patient Instructions (Signed)
 IPF (idiopathic pulmonary fibrosis) (HCC) Encounter therapeutic monitoring   -slightly progressive dec 2020 -> may 2022 - > stable since starting esbriet  summer 2022 ->through 04/11/2024 based on symptoms and PFT  - LFT normal with PCP agu 2025    Plan - continue esbriet   for IPF  - Sending message to the pharmacy team to see if they can do multiple months of pirfenidone  because he will be in [place] - Do repeat spirometry and DLCO in March 2026 - LFT at followup   Chronic cough - due to IPF  Plan - tessalone cough perles 200mg  tid prn x 30 days with 1 refill  Travel advice encounter  Plan - Continue travel with mask - Avoid respiratory infections while in [place].   Follow-up -  -March 2026 after spirometry and DLCO; 30-minute visit

## 2024-04-10 NOTE — Patient Instructions (Signed)
 Spirometry and diffusion capacity performed today.

## 2024-04-10 NOTE — Progress Notes (Signed)
 Spirometry and diffusion capacity performed today.

## 2024-04-11 ENCOUNTER — Telehealth: Payer: Self-pay | Admitting: Internal Medicine

## 2024-04-11 ENCOUNTER — Ambulatory Visit (INDEPENDENT_AMBULATORY_CARE_PROVIDER_SITE_OTHER): Admitting: Internal Medicine

## 2024-04-11 ENCOUNTER — Other Ambulatory Visit: Payer: Self-pay

## 2024-04-11 DIAGNOSIS — Z7184 Encounter for health counseling related to travel: Secondary | ICD-10-CM

## 2024-04-11 DIAGNOSIS — R053 Chronic cough: Secondary | ICD-10-CM

## 2024-04-11 DIAGNOSIS — J84112 Idiopathic pulmonary fibrosis: Secondary | ICD-10-CM | POA: Diagnosis not present

## 2024-04-11 DIAGNOSIS — Z5181 Encounter for therapeutic drug level monitoring: Secondary | ICD-10-CM

## 2024-04-11 MED ORDER — BENZONATATE 200 MG PO CAPS: 200.0000 mg | ORAL_CAPSULE | Freq: Three times a day (TID) | ORAL | 1 refills | Status: AC | PRN

## 2024-04-11 NOTE — Telephone Encounter (Signed)
 [name] and team   Alan Roman is going to [place] on April 17, 2024.  He wants his pirfenidone  filled all the way through March 2026.  He gets this at [facility].  Can you facilitate please?   If they want to do it then he will have to buy over-the-counter there or you have to give him donor samples  Thanks    SIGNATURE    Dr. Dorethia Cave, M.D., F.C.C.P,  Pulmonary and Critical Care Medicine Staff Physician, [facility] Director - Interstitial Lung Disease  Program  Pulmonary Fibrosis [facility] at [facility] [place], [place], [address]   Pager: [phone], If no answer  -> Check AMION or Try [phone] Telephone (clinical office): [phone] Telephone (research): [phone]  2:12 PM 04/11/2024

## 2024-04-11 NOTE — Telephone Encounter (Signed)
 We can ask pharmacy to run a vacation override but definitely will not be covered for 6 months.  He will need to receive medication from [place] unfortunately.  Call center will attempt vacation override tomorrow.  Unfortunately we do not have any pirfenidone  donor samples on site at office.  Sherry Pennant, PharmD, MPH, BCPS, CPP Clinical Pharmacist [facility])

## 2024-04-12 ENCOUNTER — Telehealth (HOSPITAL_BASED_OUTPATIENT_CLINIC_OR_DEPARTMENT_OTHER): Payer: Self-pay

## 2024-04-12 ENCOUNTER — Other Ambulatory Visit: Payer: Self-pay

## 2024-04-12 ENCOUNTER — Other Ambulatory Visit (HOSPITAL_COMMUNITY): Payer: Self-pay

## 2024-04-12 NOTE — Telephone Encounter (Signed)
 Copied from CRM [id number]. Topic: Clinical - Prescription Issue >> Apr 12, 2024  9:41 AM Rilla NOVAK wrote: Reason for CRM: Patient's daughter calling regarding script for pirfenidone .  Please call [place] [phone].

## 2024-04-12 NOTE — Progress Notes (Signed)
 Patient is actually no longer eligible for Esbriet  through [employer] in 2025 .  Patient stable on pirfenidone  801mg  three times daily. Continue as prescribed.  Monitor LFTs every 3 months  Sherry Pennant, PharmD, MPH, BCPS, CPP Clinical Pharmacist [facility])

## 2024-04-12 NOTE — Progress Notes (Signed)
 Specialty Pharmacy Refill Coordination Note  Alan Roman is a 78 y.o. male contacted today regarding refills of specialty medication(s) Pirfenidone   Spoke with patient's daughter   Patient requested [name] at [facility] at [place] date: 04/12/24   Medication will be filled on 09.12.25.    Filling for 90ds since patient will be out of country until Feb. 2026. Override is placed and copay is $0.

## 2024-04-12 NOTE — Telephone Encounter (Signed)
 Specialty pharmacy spoke with Kirti. They are filling a one-time 90 day supply for patient. Please do NOT discontinue pirfenidone  rx

## 2024-04-15 ENCOUNTER — Telehealth: Payer: Self-pay

## 2024-04-15 ENCOUNTER — Other Ambulatory Visit (HOSPITAL_COMMUNITY): Payer: Self-pay

## 2024-04-15 NOTE — Telephone Encounter (Signed)
*  Pulm  Pharmacy Patient Advocate Encounter  Received notification from [facility] that Prior Authorization for Benzonatate  200MG  capsules  has been CANCELLED due to Medication not covered under Part D law.

## 2024-06-03 ENCOUNTER — Encounter: Payer: Self-pay | Admitting: Radiology

## 2024-07-04 ENCOUNTER — Other Ambulatory Visit: Payer: Self-pay

## 2024-07-04 ENCOUNTER — Other Ambulatory Visit: Payer: Self-pay | Admitting: Pharmacy Technician

## 2024-07-04 NOTE — Progress Notes (Signed)
 Specialty Pharmacy Refill Coordination Note  Alan Roman is a 78 y.o. male contacted today regarding refills of specialty medication(s) Pirfenidone   Spoke with Daughter.  Patient requested Delivery   Delivery date: 07/10/24   Verified address: [address] [place], [place] [address]   Medication will be filled on: 07/09/24

## 2024-07-09 ENCOUNTER — Other Ambulatory Visit: Payer: Self-pay

## 2024-07-30 ENCOUNTER — Other Ambulatory Visit (HOSPITAL_COMMUNITY): Payer: Self-pay

## 2024-08-02 ENCOUNTER — Telehealth: Payer: Self-pay

## 2024-08-02 ENCOUNTER — Other Ambulatory Visit (HOSPITAL_COMMUNITY): Payer: Self-pay

## 2024-08-02 DIAGNOSIS — J849 Interstitial pulmonary disease, unspecified: Secondary | ICD-10-CM

## 2024-08-02 NOTE — Telephone Encounter (Signed)
 Patient's grant for pirfenidone  is out of funds. Expires 08/14/2024. There appears to be a grant open thru [employer] that he can be potentially enrolled into.  His next fill is due around 08/09/24

## 2024-08-02 NOTE — Telephone Encounter (Signed)
 Pt was already enrolled in pulmonary fibrosis grant through [employer]:  Amount remaining: $7542.50 Award Period: 12/13/23 - 12/11/24 BIN: [id number] PCN: [id number] Group: [id number] ID: [id number]  Helpdesk phone #: [phone]   Submitted a Prior Authorization request to [employer] [place] for PIRFENIDONE  via CoverMyMeds. Will update once we receive a response. Currently waiting for questions to be returned.  Key: [id number]

## 2024-08-05 ENCOUNTER — Other Ambulatory Visit: Payer: Self-pay

## 2024-08-05 ENCOUNTER — Other Ambulatory Visit (HOSPITAL_COMMUNITY): Payer: Self-pay

## 2024-08-05 NOTE — Telephone Encounter (Signed)
 Questions were returned and pa was fully submitted to plan.

## 2024-08-05 NOTE — Telephone Encounter (Signed)
 Copied from CRM [id number]. Topic: Clinical - Medication Prior Auth >> Aug 05, 2024 12:30 PM Rozanna MATSU wrote: Reason for CRM: Graciella with [facility] Medicare state the Pirfenidone  801 MG TABS  was approved for one year starting today. She will be faxing the information over also.

## 2024-08-06 NOTE — Telephone Encounter (Signed)
 Received notification from [facility] regarding a prior authorization for PIRFENIDONE . Authorization has been APPROVED from 08/05/2024 to 08/05/2025. Approval letter sent to scan center.  Patient can continue to fill through [facility]: [phone]   [name] will onboard patient once due.  Sherry Pennant, PharmD, MPH, BCPS, CPP Clinical Pharmacist

## 2024-08-07 ENCOUNTER — Other Ambulatory Visit: Payer: Self-pay

## 2024-08-30 ENCOUNTER — Other Ambulatory Visit: Payer: Self-pay

## 2024-08-30 NOTE — Addendum Note (Signed)
 Addended by: SHAREN DELON HERO on: 08/30/2024 11:53 AM   Modules accepted: Orders

## 2024-09-02 ENCOUNTER — Other Ambulatory Visit: Payer: Self-pay

## 2024-09-03 ENCOUNTER — Other Ambulatory Visit: Payer: Self-pay

## 2024-09-05 ENCOUNTER — Other Ambulatory Visit: Payer: Self-pay
